# Patient Record
Sex: Female | Born: 1955 | Race: White | Hispanic: No | Marital: Married | Smoking: Never smoker
Health system: Southern US, Community
[De-identification: ages and names within clinical notes are randomized; demographics above are authoritative.]

## PROBLEM LIST (undated history)

## (undated) DIAGNOSIS — IMO0001 Reserved for inherently not codable concepts without codable children: Secondary | ICD-10-CM

## (undated) DIAGNOSIS — Z973 Presence of spectacles and contact lenses: Secondary | ICD-10-CM

## (undated) DIAGNOSIS — F32A Depression, unspecified: Secondary | ICD-10-CM

## (undated) DIAGNOSIS — Q2112 Patent foramen ovale: Secondary | ICD-10-CM

## (undated) DIAGNOSIS — W19XXXA Unspecified fall, initial encounter: Secondary | ICD-10-CM

## (undated) DIAGNOSIS — K589 Irritable bowel syndrome without diarrhea: Secondary | ICD-10-CM

## (undated) DIAGNOSIS — I10 Essential (primary) hypertension: Secondary | ICD-10-CM

## (undated) DIAGNOSIS — N200 Calculus of kidney: Secondary | ICD-10-CM

## (undated) DIAGNOSIS — M1990 Unspecified osteoarthritis, unspecified site: Secondary | ICD-10-CM

## (undated) DIAGNOSIS — G2581 Restless legs syndrome: Secondary | ICD-10-CM

## (undated) DIAGNOSIS — F4310 Post-traumatic stress disorder, unspecified: Secondary | ICD-10-CM

## (undated) DIAGNOSIS — Z9889 Other specified postprocedural states: Secondary | ICD-10-CM

## (undated) DIAGNOSIS — R112 Nausea with vomiting, unspecified: Secondary | ICD-10-CM

## (undated) DIAGNOSIS — J189 Pneumonia, unspecified organism: Secondary | ICD-10-CM

## (undated) DIAGNOSIS — F319 Bipolar disorder, unspecified: Secondary | ICD-10-CM

## (undated) DIAGNOSIS — J45909 Unspecified asthma, uncomplicated: Secondary | ICD-10-CM

## (undated) DIAGNOSIS — I471 Supraventricular tachycardia: Secondary | ICD-10-CM

## (undated) DIAGNOSIS — F329 Major depressive disorder, single episode, unspecified: Secondary | ICD-10-CM

## (undated) DIAGNOSIS — T8859XA Other complications of anesthesia, initial encounter: Secondary | ICD-10-CM

## (undated) DIAGNOSIS — E559 Vitamin D deficiency, unspecified: Secondary | ICD-10-CM

## (undated) DIAGNOSIS — M109 Gout, unspecified: Secondary | ICD-10-CM

## (undated) DIAGNOSIS — K5790 Diverticulosis of intestine, part unspecified, without perforation or abscess without bleeding: Secondary | ICD-10-CM

## (undated) DIAGNOSIS — F3181 Bipolar II disorder: Secondary | ICD-10-CM

## (undated) DIAGNOSIS — K08109 Complete loss of teeth, unspecified cause, unspecified class: Secondary | ICD-10-CM

## (undated) DIAGNOSIS — G894 Chronic pain syndrome: Secondary | ICD-10-CM

## (undated) DIAGNOSIS — I4719 Other supraventricular tachycardia: Secondary | ICD-10-CM

## (undated) DIAGNOSIS — B009 Herpesviral infection, unspecified: Secondary | ICD-10-CM

## (undated) DIAGNOSIS — M47812 Spondylosis without myelopathy or radiculopathy, cervical region: Secondary | ICD-10-CM

## (undated) DIAGNOSIS — G5600 Carpal tunnel syndrome, unspecified upper limb: Secondary | ICD-10-CM

## (undated) DIAGNOSIS — T4145XA Adverse effect of unspecified anesthetic, initial encounter: Secondary | ICD-10-CM

## (undated) DIAGNOSIS — R591 Generalized enlarged lymph nodes: Secondary | ICD-10-CM

## (undated) DIAGNOSIS — R51 Headache: Secondary | ICD-10-CM

## (undated) DIAGNOSIS — Z972 Presence of dental prosthetic device (complete) (partial): Secondary | ICD-10-CM

## (undated) DIAGNOSIS — C8206 Follicular lymphoma grade I, intrapelvic lymph nodes: Principal | ICD-10-CM

## (undated) DIAGNOSIS — M797 Fibromyalgia: Secondary | ICD-10-CM

## (undated) DIAGNOSIS — R296 Repeated falls: Secondary | ICD-10-CM

## (undated) DIAGNOSIS — F419 Anxiety disorder, unspecified: Secondary | ICD-10-CM

## (undated) DIAGNOSIS — M5414 Radiculopathy, thoracic region: Secondary | ICD-10-CM

## (undated) DIAGNOSIS — I25811 Atherosclerosis of native coronary artery of transplanted heart without angina pectoris: Secondary | ICD-10-CM

## (undated) DIAGNOSIS — M7530 Calcific tendinitis of unspecified shoulder: Secondary | ICD-10-CM

## (undated) HISTORY — DX: Depression, unspecified: F32.A

## (undated) HISTORY — DX: Unspecified asthma, uncomplicated: J45.909

## (undated) HISTORY — DX: Unspecified fall, initial encounter: W19.XXXA

## (undated) HISTORY — DX: Follicular lymphoma grade i, intrapelvic lymph nodes: C82.06

## (undated) HISTORY — DX: Atherosclerosis of native coronary artery of transplanted heart without angina pectoris: I25.811

## (undated) HISTORY — DX: Calculus of kidney: N20.0

## (undated) HISTORY — PX: COLONOSCOPY: SHX174

## (undated) HISTORY — PX: OTHER SURGICAL HISTORY: SHX169

## (undated) HISTORY — PX: CYSTOSCOPY: SUR368

## (undated) HISTORY — PX: FOOT SURGERY: SHX648

## (undated) HISTORY — DX: Essential (primary) hypertension: I10

## (undated) HISTORY — DX: Repeated falls: R29.6

## (undated) HISTORY — DX: Major depressive disorder, single episode, unspecified: F32.9

## (undated) HISTORY — DX: Fibromyalgia: M79.7

## (undated) HISTORY — DX: Reserved for inherently not codable concepts without codable children: IMO0001

## (undated) HISTORY — DX: Radiculopathy, thoracic region: M54.14

## (undated) HISTORY — DX: Gout, unspecified: M10.9

## (undated) HISTORY — DX: Calcific tendinitis of unspecified shoulder: M75.30

## (undated) HISTORY — PX: LITHOTRIPSY: SUR834

## (undated) HISTORY — DX: Anxiety disorder, unspecified: F41.9

## (undated) HISTORY — PX: ABDOMINAL ADHESION SURGERY: SHX90

## (undated) HISTORY — DX: Bipolar disorder, unspecified: F31.9

## (undated) HISTORY — DX: Other supraventricular tachycardia: I47.19

## (undated) HISTORY — DX: Headache: R51

## (undated) HISTORY — DX: Supraventricular tachycardia: I47.1

## (undated) HISTORY — DX: Spondylosis without myelopathy or radiculopathy, cervical region: M47.812

## (undated) HISTORY — DX: Carpal tunnel syndrome, unspecified upper limb: G56.00

## (undated) HISTORY — DX: Diverticulosis of intestine, part unspecified, without perforation or abscess without bleeding: K57.90

## (undated) HISTORY — DX: Patent foramen ovale: Q21.12

## (undated) HISTORY — DX: Bipolar II disorder: F31.81

## (undated) HISTORY — DX: Post-traumatic stress disorder, unspecified: F43.10

---

## 1974-10-02 HISTORY — PX: TONSILLECTOMY: SUR1361

## 1983-10-03 HISTORY — PX: CHOLECYSTECTOMY: SHX55

## 1991-10-03 HISTORY — PX: APPENDECTOMY: SHX54

## 1993-10-02 HISTORY — PX: ABDOMINAL HYSTERECTOMY: SHX81

## 1997-12-31 ENCOUNTER — Inpatient Hospital Stay (HOSPITAL_COMMUNITY): Admission: EM | Admit: 1997-12-31 | Discharge: 1998-01-06 | Payer: Self-pay | Admitting: Emergency Medicine

## 1998-10-29 ENCOUNTER — Ambulatory Visit (HOSPITAL_COMMUNITY): Admission: RE | Admit: 1998-10-29 | Discharge: 1998-10-29 | Payer: Self-pay | Admitting: Gastroenterology

## 1999-10-05 ENCOUNTER — Emergency Department (HOSPITAL_COMMUNITY): Admission: EM | Admit: 1999-10-05 | Discharge: 1999-10-06 | Payer: Self-pay | Admitting: Emergency Medicine

## 1999-10-06 ENCOUNTER — Emergency Department (HOSPITAL_COMMUNITY): Admission: EM | Admit: 1999-10-06 | Discharge: 1999-10-06 | Payer: Self-pay | Admitting: *Deleted

## 1999-10-06 ENCOUNTER — Encounter: Payer: Self-pay | Admitting: Emergency Medicine

## 1999-12-08 ENCOUNTER — Encounter: Payer: Self-pay | Admitting: Obstetrics and Gynecology

## 1999-12-08 ENCOUNTER — Encounter: Admission: RE | Admit: 1999-12-08 | Discharge: 1999-12-08 | Payer: Self-pay | Admitting: Obstetrics and Gynecology

## 2000-04-27 ENCOUNTER — Emergency Department (HOSPITAL_COMMUNITY): Admission: EM | Admit: 2000-04-27 | Discharge: 2000-04-28 | Payer: Self-pay | Admitting: Emergency Medicine

## 2000-06-05 ENCOUNTER — Ambulatory Visit (HOSPITAL_COMMUNITY): Admission: RE | Admit: 2000-06-05 | Discharge: 2000-06-05 | Payer: Self-pay | Admitting: Gastroenterology

## 2000-06-05 ENCOUNTER — Encounter: Payer: Self-pay | Admitting: Gastroenterology

## 2000-07-24 ENCOUNTER — Emergency Department (HOSPITAL_COMMUNITY): Admission: EM | Admit: 2000-07-24 | Discharge: 2000-07-25 | Payer: Self-pay

## 2000-07-30 ENCOUNTER — Emergency Department (HOSPITAL_COMMUNITY): Admission: EM | Admit: 2000-07-30 | Discharge: 2000-07-30 | Payer: Self-pay | Admitting: Emergency Medicine

## 2000-08-16 ENCOUNTER — Inpatient Hospital Stay (HOSPITAL_COMMUNITY): Admission: EM | Admit: 2000-08-16 | Discharge: 2000-08-21 | Payer: Self-pay | Admitting: Psychiatry

## 2000-08-22 ENCOUNTER — Inpatient Hospital Stay (HOSPITAL_COMMUNITY): Admission: EM | Admit: 2000-08-22 | Discharge: 2000-08-28 | Payer: Self-pay | Admitting: *Deleted

## 2000-10-02 HISTORY — PX: NECK SURGERY: SHX720

## 2001-01-03 ENCOUNTER — Encounter: Payer: Self-pay | Admitting: Obstetrics and Gynecology

## 2001-01-03 ENCOUNTER — Encounter: Admission: RE | Admit: 2001-01-03 | Discharge: 2001-01-03 | Payer: Self-pay | Admitting: Obstetrics and Gynecology

## 2001-05-30 ENCOUNTER — Encounter: Payer: Self-pay | Admitting: Neurosurgery

## 2001-06-04 ENCOUNTER — Ambulatory Visit (HOSPITAL_COMMUNITY): Admission: RE | Admit: 2001-06-04 | Discharge: 2001-06-04 | Payer: Self-pay | Admitting: Neurosurgery

## 2001-06-04 ENCOUNTER — Encounter: Payer: Self-pay | Admitting: Neurosurgery

## 2001-06-04 HISTORY — PX: CERVICAL DISC SURGERY: SHX588

## 2001-07-23 ENCOUNTER — Ambulatory Visit (HOSPITAL_COMMUNITY): Admission: RE | Admit: 2001-07-23 | Discharge: 2001-07-23 | Payer: Self-pay | Admitting: Neurosurgery

## 2001-07-23 ENCOUNTER — Encounter: Payer: Self-pay | Admitting: Neurosurgery

## 2002-01-22 ENCOUNTER — Other Ambulatory Visit: Admission: RE | Admit: 2002-01-22 | Discharge: 2002-01-22 | Payer: Self-pay | Admitting: Gynecology

## 2002-01-22 ENCOUNTER — Encounter: Admission: RE | Admit: 2002-01-22 | Discharge: 2002-01-22 | Payer: Self-pay | Admitting: Gynecology

## 2002-01-22 ENCOUNTER — Encounter: Payer: Self-pay | Admitting: Gynecology

## 2002-06-26 ENCOUNTER — Encounter: Payer: Self-pay | Admitting: Urology

## 2002-06-26 ENCOUNTER — Ambulatory Visit (HOSPITAL_COMMUNITY): Admission: RE | Admit: 2002-06-26 | Discharge: 2002-06-26 | Payer: Self-pay | Admitting: Urology

## 2002-07-01 ENCOUNTER — Ambulatory Visit (HOSPITAL_BASED_OUTPATIENT_CLINIC_OR_DEPARTMENT_OTHER): Admission: RE | Admit: 2002-07-01 | Discharge: 2002-07-01 | Payer: Self-pay | Admitting: Urology

## 2002-07-02 HISTORY — PX: OTHER SURGICAL HISTORY: SHX169

## 2002-10-13 ENCOUNTER — Encounter: Admission: RE | Admit: 2002-10-13 | Discharge: 2002-10-13 | Payer: Self-pay | Admitting: Urology

## 2002-10-13 ENCOUNTER — Encounter: Payer: Self-pay | Admitting: Urology

## 2003-01-27 ENCOUNTER — Encounter: Admission: RE | Admit: 2003-01-27 | Discharge: 2003-01-27 | Payer: Self-pay | Admitting: Gynecology

## 2003-01-27 ENCOUNTER — Encounter: Payer: Self-pay | Admitting: Gynecology

## 2003-01-27 ENCOUNTER — Other Ambulatory Visit: Admission: RE | Admit: 2003-01-27 | Discharge: 2003-01-27 | Payer: Self-pay | Admitting: Gynecology

## 2003-03-20 ENCOUNTER — Encounter
Admission: RE | Admit: 2003-03-20 | Discharge: 2003-06-18 | Payer: Self-pay | Admitting: Physical Medicine & Rehabilitation

## 2003-05-07 ENCOUNTER — Encounter
Admission: RE | Admit: 2003-05-07 | Discharge: 2003-07-22 | Payer: Self-pay | Admitting: Physical Medicine & Rehabilitation

## 2003-06-17 ENCOUNTER — Encounter
Admission: RE | Admit: 2003-06-17 | Discharge: 2003-09-15 | Payer: Self-pay | Admitting: Physical Medicine & Rehabilitation

## 2003-06-22 ENCOUNTER — Emergency Department (HOSPITAL_COMMUNITY): Admission: EM | Admit: 2003-06-22 | Discharge: 2003-06-22 | Payer: Self-pay | Admitting: Emergency Medicine

## 2003-07-09 ENCOUNTER — Emergency Department (HOSPITAL_COMMUNITY): Admission: EM | Admit: 2003-07-09 | Discharge: 2003-07-09 | Payer: Self-pay | Admitting: Emergency Medicine

## 2003-09-02 ENCOUNTER — Ambulatory Visit (HOSPITAL_COMMUNITY)
Admission: RE | Admit: 2003-09-02 | Discharge: 2003-09-02 | Payer: Self-pay | Admitting: Physical Medicine & Rehabilitation

## 2003-09-15 ENCOUNTER — Encounter
Admission: RE | Admit: 2003-09-15 | Discharge: 2003-11-25 | Payer: Self-pay | Admitting: Physical Medicine & Rehabilitation

## 2003-09-17 ENCOUNTER — Encounter
Admission: RE | Admit: 2003-09-17 | Discharge: 2003-12-16 | Payer: Self-pay | Admitting: Physical Medicine & Rehabilitation

## 2003-09-28 ENCOUNTER — Emergency Department (HOSPITAL_COMMUNITY): Admission: EM | Admit: 2003-09-28 | Discharge: 2003-09-28 | Payer: Self-pay | Admitting: Emergency Medicine

## 2003-09-30 ENCOUNTER — Ambulatory Visit (HOSPITAL_COMMUNITY): Admission: RE | Admit: 2003-09-30 | Discharge: 2003-09-30 | Payer: Self-pay | Admitting: Emergency Medicine

## 2003-11-10 ENCOUNTER — Emergency Department (HOSPITAL_COMMUNITY): Admission: EM | Admit: 2003-11-10 | Discharge: 2003-11-10 | Payer: Self-pay

## 2003-11-26 ENCOUNTER — Encounter
Admission: RE | Admit: 2003-11-26 | Discharge: 2003-12-11 | Payer: Self-pay | Admitting: Physical Medicine & Rehabilitation

## 2003-11-27 ENCOUNTER — Ambulatory Visit (HOSPITAL_COMMUNITY)
Admission: RE | Admit: 2003-11-27 | Discharge: 2003-11-27 | Payer: Self-pay | Admitting: Physical Medicine & Rehabilitation

## 2003-12-09 ENCOUNTER — Emergency Department (HOSPITAL_COMMUNITY): Admission: EM | Admit: 2003-12-09 | Discharge: 2003-12-09 | Payer: Self-pay

## 2003-12-10 ENCOUNTER — Emergency Department (HOSPITAL_COMMUNITY): Admission: EM | Admit: 2003-12-10 | Discharge: 2003-12-10 | Payer: Self-pay | Admitting: Emergency Medicine

## 2003-12-14 ENCOUNTER — Emergency Department (HOSPITAL_COMMUNITY): Admission: EM | Admit: 2003-12-14 | Discharge: 2003-12-15 | Payer: Self-pay | Admitting: *Deleted

## 2004-01-05 ENCOUNTER — Ambulatory Visit (HOSPITAL_COMMUNITY): Admission: RE | Admit: 2004-01-05 | Discharge: 2004-01-05 | Payer: Self-pay | Admitting: Neurology

## 2004-01-18 ENCOUNTER — Encounter
Admission: RE | Admit: 2004-01-18 | Discharge: 2004-04-17 | Payer: Self-pay | Admitting: Physical Medicine & Rehabilitation

## 2004-03-29 ENCOUNTER — Ambulatory Visit (HOSPITAL_COMMUNITY): Admission: RE | Admit: 2004-03-29 | Discharge: 2004-03-29 | Payer: Self-pay | Admitting: Endocrinology

## 2004-04-12 ENCOUNTER — Ambulatory Visit (HOSPITAL_COMMUNITY): Admission: RE | Admit: 2004-04-12 | Discharge: 2004-04-12 | Payer: Self-pay | Admitting: Endocrinology

## 2004-04-16 ENCOUNTER — Emergency Department (HOSPITAL_COMMUNITY): Admission: EM | Admit: 2004-04-16 | Discharge: 2004-04-16 | Payer: Self-pay | Admitting: Emergency Medicine

## 2004-04-20 ENCOUNTER — Encounter
Admission: RE | Admit: 2004-04-20 | Discharge: 2004-06-15 | Payer: Self-pay | Admitting: Physical Medicine & Rehabilitation

## 2004-04-21 ENCOUNTER — Emergency Department (HOSPITAL_COMMUNITY): Admission: EM | Admit: 2004-04-21 | Discharge: 2004-04-21 | Payer: Self-pay | Admitting: Emergency Medicine

## 2004-04-27 ENCOUNTER — Ambulatory Visit (HOSPITAL_COMMUNITY): Admission: RE | Admit: 2004-04-27 | Discharge: 2004-04-27 | Payer: Self-pay | Admitting: Emergency Medicine

## 2004-05-16 ENCOUNTER — Other Ambulatory Visit: Admission: RE | Admit: 2004-05-16 | Discharge: 2004-05-16 | Payer: Self-pay | Admitting: Gynecology

## 2004-06-15 ENCOUNTER — Encounter
Admission: RE | Admit: 2004-06-15 | Discharge: 2004-09-13 | Payer: Self-pay | Admitting: Physical Medicine & Rehabilitation

## 2004-06-15 ENCOUNTER — Ambulatory Visit: Payer: Self-pay | Admitting: Physical Medicine & Rehabilitation

## 2004-06-22 ENCOUNTER — Emergency Department (HOSPITAL_COMMUNITY): Admission: EM | Admit: 2004-06-22 | Discharge: 2004-06-22 | Payer: Self-pay | Admitting: Emergency Medicine

## 2004-06-27 ENCOUNTER — Ambulatory Visit (HOSPITAL_COMMUNITY): Admission: RE | Admit: 2004-06-27 | Discharge: 2004-06-27 | Payer: Self-pay | Admitting: Neurology

## 2004-06-30 ENCOUNTER — Ambulatory Visit (HOSPITAL_COMMUNITY)
Admission: RE | Admit: 2004-06-30 | Discharge: 2004-06-30 | Payer: Self-pay | Admitting: Physical Medicine & Rehabilitation

## 2004-07-11 ENCOUNTER — Emergency Department (HOSPITAL_COMMUNITY): Admission: EM | Admit: 2004-07-11 | Discharge: 2004-07-11 | Payer: Self-pay | Admitting: *Deleted

## 2004-08-23 ENCOUNTER — Encounter
Admission: RE | Admit: 2004-08-23 | Discharge: 2004-11-21 | Payer: Self-pay | Admitting: Physical Medicine & Rehabilitation

## 2004-08-29 ENCOUNTER — Ambulatory Visit: Payer: Self-pay | Admitting: Physical Medicine & Rehabilitation

## 2004-10-28 ENCOUNTER — Ambulatory Visit: Payer: Self-pay | Admitting: Physical Medicine & Rehabilitation

## 2004-12-08 ENCOUNTER — Emergency Department (HOSPITAL_COMMUNITY): Admission: EM | Admit: 2004-12-08 | Discharge: 2004-12-08 | Payer: Self-pay | Admitting: Emergency Medicine

## 2004-12-19 ENCOUNTER — Encounter
Admission: RE | Admit: 2004-12-19 | Discharge: 2005-03-19 | Payer: Self-pay | Admitting: Physical Medicine & Rehabilitation

## 2004-12-21 ENCOUNTER — Ambulatory Visit: Payer: Self-pay | Admitting: Physical Medicine & Rehabilitation

## 2005-01-12 ENCOUNTER — Ambulatory Visit (HOSPITAL_COMMUNITY): Admission: RE | Admit: 2005-01-12 | Discharge: 2005-01-12 | Payer: Self-pay | Admitting: Neurology

## 2005-02-20 ENCOUNTER — Ambulatory Visit: Payer: Self-pay | Admitting: Physical Medicine & Rehabilitation

## 2005-04-06 ENCOUNTER — Encounter
Admission: RE | Admit: 2005-04-06 | Discharge: 2005-07-05 | Payer: Self-pay | Admitting: Physical Medicine & Rehabilitation

## 2005-04-06 ENCOUNTER — Ambulatory Visit: Payer: Self-pay | Admitting: Physical Medicine & Rehabilitation

## 2005-05-30 ENCOUNTER — Ambulatory Visit: Payer: Self-pay | Admitting: Physical Medicine & Rehabilitation

## 2005-06-09 ENCOUNTER — Emergency Department (HOSPITAL_COMMUNITY): Admission: EM | Admit: 2005-06-09 | Discharge: 2005-06-09 | Payer: Self-pay | Admitting: Emergency Medicine

## 2005-08-01 ENCOUNTER — Ambulatory Visit: Payer: Self-pay | Admitting: Physical Medicine & Rehabilitation

## 2005-08-01 ENCOUNTER — Encounter
Admission: RE | Admit: 2005-08-01 | Discharge: 2005-10-30 | Payer: Self-pay | Admitting: Physical Medicine & Rehabilitation

## 2005-10-05 ENCOUNTER — Other Ambulatory Visit: Admission: RE | Admit: 2005-10-05 | Discharge: 2005-10-05 | Payer: Self-pay | Admitting: Gynecology

## 2005-10-05 ENCOUNTER — Ambulatory Visit: Payer: Self-pay | Admitting: Physical Medicine & Rehabilitation

## 2005-10-16 ENCOUNTER — Ambulatory Visit: Payer: Self-pay | Admitting: Physical Medicine & Rehabilitation

## 2005-11-29 ENCOUNTER — Encounter
Admission: RE | Admit: 2005-11-29 | Discharge: 2006-02-27 | Payer: Self-pay | Admitting: Physical Medicine & Rehabilitation

## 2005-12-01 ENCOUNTER — Ambulatory Visit: Payer: Self-pay | Admitting: Physical Medicine & Rehabilitation

## 2006-01-05 ENCOUNTER — Emergency Department (HOSPITAL_COMMUNITY): Admission: EM | Admit: 2006-01-05 | Discharge: 2006-01-05 | Payer: Self-pay | Admitting: Emergency Medicine

## 2006-01-18 ENCOUNTER — Ambulatory Visit: Payer: Self-pay | Admitting: Critical Care Medicine

## 2006-01-18 ENCOUNTER — Inpatient Hospital Stay (HOSPITAL_COMMUNITY): Admission: EM | Admit: 2006-01-18 | Discharge: 2006-01-22 | Payer: Self-pay | Admitting: Emergency Medicine

## 2006-01-22 ENCOUNTER — Inpatient Hospital Stay (HOSPITAL_COMMUNITY): Admission: RE | Admit: 2006-01-22 | Discharge: 2006-01-29 | Payer: Self-pay | Admitting: Psychiatry

## 2006-01-23 ENCOUNTER — Ambulatory Visit: Payer: Self-pay | Admitting: Psychiatry

## 2006-02-07 ENCOUNTER — Ambulatory Visit: Payer: Self-pay | Admitting: Physical Medicine & Rehabilitation

## 2006-03-08 ENCOUNTER — Encounter
Admission: RE | Admit: 2006-03-08 | Discharge: 2006-06-06 | Payer: Self-pay | Admitting: Physical Medicine & Rehabilitation

## 2006-04-02 ENCOUNTER — Ambulatory Visit: Payer: Self-pay | Admitting: Physical Medicine & Rehabilitation

## 2006-05-02 ENCOUNTER — Ambulatory Visit: Payer: Self-pay | Admitting: Physical Medicine & Rehabilitation

## 2006-05-29 ENCOUNTER — Ambulatory Visit: Payer: Self-pay | Admitting: Physical Medicine & Rehabilitation

## 2006-06-25 ENCOUNTER — Encounter
Admission: RE | Admit: 2006-06-25 | Discharge: 2006-09-23 | Payer: Self-pay | Admitting: Physical Medicine & Rehabilitation

## 2006-06-26 ENCOUNTER — Ambulatory Visit (HOSPITAL_COMMUNITY)
Admission: RE | Admit: 2006-06-26 | Discharge: 2006-06-26 | Payer: Self-pay | Admitting: Physical Medicine & Rehabilitation

## 2006-07-25 ENCOUNTER — Ambulatory Visit: Payer: Self-pay | Admitting: Physical Medicine & Rehabilitation

## 2006-08-20 ENCOUNTER — Ambulatory Visit: Payer: Self-pay | Admitting: Physical Medicine & Rehabilitation

## 2006-09-14 ENCOUNTER — Ambulatory Visit: Payer: Self-pay | Admitting: Physical Medicine & Rehabilitation

## 2006-09-17 ENCOUNTER — Emergency Department (HOSPITAL_COMMUNITY): Admission: EM | Admit: 2006-09-17 | Discharge: 2006-09-17 | Payer: Self-pay | Admitting: Emergency Medicine

## 2006-10-11 ENCOUNTER — Encounter
Admission: RE | Admit: 2006-10-11 | Discharge: 2007-01-09 | Payer: Self-pay | Admitting: Physical Medicine & Rehabilitation

## 2006-10-12 ENCOUNTER — Other Ambulatory Visit: Admission: RE | Admit: 2006-10-12 | Discharge: 2006-10-12 | Payer: Self-pay | Admitting: Family Medicine

## 2006-10-29 ENCOUNTER — Emergency Department (HOSPITAL_COMMUNITY): Admission: EM | Admit: 2006-10-29 | Discharge: 2006-10-29 | Payer: Self-pay | Admitting: Emergency Medicine

## 2006-11-12 ENCOUNTER — Ambulatory Visit: Payer: Self-pay | Admitting: Physical Medicine & Rehabilitation

## 2007-01-01 ENCOUNTER — Observation Stay (HOSPITAL_COMMUNITY): Admission: EM | Admit: 2007-01-01 | Discharge: 2007-01-02 | Payer: Self-pay | Admitting: Emergency Medicine

## 2007-01-03 ENCOUNTER — Encounter
Admission: RE | Admit: 2007-01-03 | Discharge: 2007-04-03 | Payer: Self-pay | Admitting: Physical Medicine & Rehabilitation

## 2007-01-03 ENCOUNTER — Ambulatory Visit: Payer: Self-pay | Admitting: Physical Medicine & Rehabilitation

## 2007-02-06 ENCOUNTER — Observation Stay (HOSPITAL_COMMUNITY): Admission: EM | Admit: 2007-02-06 | Discharge: 2007-02-07 | Payer: Self-pay | Admitting: Emergency Medicine

## 2007-02-07 ENCOUNTER — Encounter (INDEPENDENT_AMBULATORY_CARE_PROVIDER_SITE_OTHER): Payer: Self-pay | Admitting: Cardiology

## 2007-03-06 ENCOUNTER — Ambulatory Visit: Payer: Self-pay | Admitting: Physical Medicine & Rehabilitation

## 2007-03-28 ENCOUNTER — Encounter
Admission: RE | Admit: 2007-03-28 | Discharge: 2007-06-26 | Payer: Self-pay | Admitting: Physical Medicine & Rehabilitation

## 2007-04-26 ENCOUNTER — Ambulatory Visit: Payer: Self-pay | Admitting: Physical Medicine & Rehabilitation

## 2007-05-21 ENCOUNTER — Encounter: Admission: RE | Admit: 2007-05-21 | Discharge: 2007-05-21 | Payer: Self-pay | Admitting: Family Medicine

## 2007-07-03 ENCOUNTER — Ambulatory Visit: Payer: Self-pay | Admitting: Physical Medicine & Rehabilitation

## 2007-07-08 ENCOUNTER — Encounter
Admission: RE | Admit: 2007-07-08 | Discharge: 2007-10-06 | Payer: Self-pay | Admitting: Physical Medicine & Rehabilitation

## 2007-08-22 ENCOUNTER — Ambulatory Visit: Payer: Self-pay | Admitting: Physical Medicine & Rehabilitation

## 2007-10-16 ENCOUNTER — Other Ambulatory Visit: Admission: RE | Admit: 2007-10-16 | Discharge: 2007-10-16 | Payer: Self-pay | Admitting: Family Medicine

## 2007-11-06 ENCOUNTER — Encounter
Admission: RE | Admit: 2007-11-06 | Discharge: 2008-02-04 | Payer: Self-pay | Admitting: Physical Medicine & Rehabilitation

## 2007-11-19 ENCOUNTER — Ambulatory Visit: Payer: Self-pay | Admitting: Physical Medicine & Rehabilitation

## 2008-02-05 ENCOUNTER — Encounter
Admission: RE | Admit: 2008-02-05 | Discharge: 2008-05-05 | Payer: Self-pay | Admitting: Physical Medicine & Rehabilitation

## 2008-02-12 ENCOUNTER — Ambulatory Visit: Payer: Self-pay | Admitting: Physical Medicine & Rehabilitation

## 2008-03-12 ENCOUNTER — Ambulatory Visit: Payer: Self-pay | Admitting: Physical Medicine & Rehabilitation

## 2008-03-23 ENCOUNTER — Encounter
Admission: RE | Admit: 2008-03-23 | Discharge: 2008-03-23 | Payer: Self-pay | Admitting: Physical Medicine & Rehabilitation

## 2008-03-23 ENCOUNTER — Ambulatory Visit: Payer: Self-pay | Admitting: Physical Medicine & Rehabilitation

## 2008-03-25 ENCOUNTER — Ambulatory Visit: Payer: Self-pay | Admitting: Physical Medicine & Rehabilitation

## 2008-04-07 ENCOUNTER — Ambulatory Visit: Payer: Self-pay | Admitting: Physical Medicine & Rehabilitation

## 2008-05-06 ENCOUNTER — Encounter
Admission: RE | Admit: 2008-05-06 | Discharge: 2008-08-04 | Payer: Self-pay | Admitting: Physical Medicine & Rehabilitation

## 2008-05-19 ENCOUNTER — Ambulatory Visit: Payer: Self-pay | Admitting: Physical Medicine & Rehabilitation

## 2008-06-16 ENCOUNTER — Ambulatory Visit: Payer: Self-pay | Admitting: Physical Medicine & Rehabilitation

## 2008-07-08 ENCOUNTER — Ambulatory Visit: Payer: Self-pay | Admitting: Physical Medicine & Rehabilitation

## 2008-07-15 ENCOUNTER — Ambulatory Visit: Payer: Self-pay | Admitting: Physical Medicine & Rehabilitation

## 2008-09-07 ENCOUNTER — Encounter
Admission: RE | Admit: 2008-09-07 | Discharge: 2008-09-09 | Payer: Self-pay | Admitting: Physical Medicine & Rehabilitation

## 2008-09-09 ENCOUNTER — Ambulatory Visit: Payer: Self-pay | Admitting: Physical Medicine & Rehabilitation

## 2008-11-10 ENCOUNTER — Ambulatory Visit: Payer: Self-pay | Admitting: Physical Medicine & Rehabilitation

## 2008-11-10 ENCOUNTER — Encounter
Admission: RE | Admit: 2008-11-10 | Discharge: 2009-02-08 | Payer: Self-pay | Admitting: Physical Medicine & Rehabilitation

## 2008-12-09 ENCOUNTER — Ambulatory Visit: Payer: Self-pay | Admitting: Physical Medicine & Rehabilitation

## 2009-01-08 ENCOUNTER — Ambulatory Visit: Payer: Self-pay | Admitting: Physical Medicine & Rehabilitation

## 2009-02-03 ENCOUNTER — Encounter
Admission: RE | Admit: 2009-02-03 | Discharge: 2009-05-04 | Payer: Self-pay | Admitting: Physical Medicine & Rehabilitation

## 2009-02-05 ENCOUNTER — Ambulatory Visit: Payer: Self-pay | Admitting: Physical Medicine & Rehabilitation

## 2009-03-11 ENCOUNTER — Ambulatory Visit: Payer: Self-pay | Admitting: Physical Medicine & Rehabilitation

## 2009-04-09 ENCOUNTER — Encounter: Admission: RE | Admit: 2009-04-09 | Discharge: 2009-04-09 | Payer: Self-pay | Admitting: Family Medicine

## 2009-04-09 ENCOUNTER — Ambulatory Visit: Payer: Self-pay | Admitting: Physical Medicine & Rehabilitation

## 2009-04-30 ENCOUNTER — Ambulatory Visit: Payer: Self-pay | Admitting: Physical Medicine & Rehabilitation

## 2009-05-25 ENCOUNTER — Encounter
Admission: RE | Admit: 2009-05-25 | Discharge: 2009-08-19 | Payer: Self-pay | Admitting: Physical Medicine & Rehabilitation

## 2009-06-02 ENCOUNTER — Ambulatory Visit: Payer: Self-pay | Admitting: Physical Medicine & Rehabilitation

## 2009-07-02 ENCOUNTER — Ambulatory Visit: Payer: Self-pay | Admitting: Physical Medicine & Rehabilitation

## 2009-07-30 ENCOUNTER — Ambulatory Visit: Payer: Self-pay | Admitting: Physical Medicine & Rehabilitation

## 2009-08-19 ENCOUNTER — Encounter
Admission: RE | Admit: 2009-08-19 | Discharge: 2009-09-29 | Payer: Self-pay | Admitting: Physical Medicine & Rehabilitation

## 2009-08-31 ENCOUNTER — Ambulatory Visit: Payer: Self-pay | Admitting: Physical Medicine & Rehabilitation

## 2009-09-29 ENCOUNTER — Ambulatory Visit: Payer: Self-pay | Admitting: Physical Medicine & Rehabilitation

## 2009-10-26 ENCOUNTER — Encounter
Admission: RE | Admit: 2009-10-26 | Discharge: 2010-01-20 | Payer: Self-pay | Admitting: Physical Medicine & Rehabilitation

## 2009-10-27 ENCOUNTER — Ambulatory Visit: Payer: Self-pay | Admitting: Physical Medicine & Rehabilitation

## 2009-11-04 ENCOUNTER — Encounter
Admission: RE | Admit: 2009-11-04 | Discharge: 2009-11-11 | Payer: Self-pay | Admitting: Physical Medicine & Rehabilitation

## 2009-11-11 ENCOUNTER — Ambulatory Visit: Payer: Self-pay | Admitting: Physical Medicine & Rehabilitation

## 2009-12-02 ENCOUNTER — Ambulatory Visit: Payer: Self-pay | Admitting: Physical Medicine & Rehabilitation

## 2009-12-03 ENCOUNTER — Ambulatory Visit: Payer: Self-pay | Admitting: Physical Medicine & Rehabilitation

## 2009-12-07 ENCOUNTER — Ambulatory Visit (HOSPITAL_COMMUNITY): Admission: RE | Admit: 2009-12-07 | Discharge: 2009-12-07 | Payer: Self-pay | Admitting: Family Medicine

## 2009-12-29 ENCOUNTER — Ambulatory Visit: Payer: Self-pay | Admitting: Physical Medicine & Rehabilitation

## 2010-01-20 ENCOUNTER — Encounter
Admission: RE | Admit: 2010-01-20 | Discharge: 2010-04-15 | Payer: Self-pay | Admitting: Physical Medicine & Rehabilitation

## 2010-01-27 ENCOUNTER — Ambulatory Visit: Payer: Self-pay | Admitting: Physical Medicine & Rehabilitation

## 2010-02-25 ENCOUNTER — Ambulatory Visit: Payer: Self-pay | Admitting: Physical Medicine & Rehabilitation

## 2010-03-24 ENCOUNTER — Ambulatory Visit: Payer: Self-pay | Admitting: Physical Medicine & Rehabilitation

## 2010-04-15 ENCOUNTER — Encounter
Admission: RE | Admit: 2010-04-15 | Discharge: 2010-07-14 | Payer: Self-pay | Admitting: Physical Medicine & Rehabilitation

## 2010-04-25 ENCOUNTER — Ambulatory Visit: Payer: Self-pay | Admitting: Physical Medicine & Rehabilitation

## 2010-05-25 ENCOUNTER — Ambulatory Visit: Payer: Self-pay | Admitting: Physical Medicine & Rehabilitation

## 2010-06-23 ENCOUNTER — Ambulatory Visit: Payer: Self-pay | Admitting: Physical Medicine & Rehabilitation

## 2010-07-14 ENCOUNTER — Encounter
Admission: RE | Admit: 2010-07-14 | Discharge: 2010-09-19 | Payer: Self-pay | Source: Home / Self Care | Attending: Physical Medicine & Rehabilitation | Admitting: Physical Medicine & Rehabilitation

## 2010-08-19 ENCOUNTER — Ambulatory Visit: Payer: Self-pay | Admitting: Physical Medicine & Rehabilitation

## 2010-09-15 ENCOUNTER — Ambulatory Visit: Payer: Self-pay | Admitting: Physical Medicine & Rehabilitation

## 2010-09-19 ENCOUNTER — Ambulatory Visit: Payer: Self-pay | Admitting: Physical Medicine & Rehabilitation

## 2010-10-17 ENCOUNTER — Ambulatory Visit (HOSPITAL_COMMUNITY)
Admission: RE | Admit: 2010-10-17 | Discharge: 2010-10-17 | Payer: Self-pay | Source: Home / Self Care | Attending: Gastroenterology | Admitting: Gastroenterology

## 2010-10-19 ENCOUNTER — Encounter
Admission: RE | Admit: 2010-10-19 | Discharge: 2010-10-24 | Payer: Self-pay | Source: Home / Self Care | Attending: Physical Medicine & Rehabilitation | Admitting: Physical Medicine & Rehabilitation

## 2010-10-20 ENCOUNTER — Ambulatory Visit (HOSPITAL_COMMUNITY)
Admission: RE | Admit: 2010-10-20 | Discharge: 2010-10-20 | Payer: Self-pay | Source: Home / Self Care | Attending: Gastroenterology | Admitting: Gastroenterology

## 2010-10-22 ENCOUNTER — Encounter: Payer: Self-pay | Admitting: Gynecology

## 2010-10-22 ENCOUNTER — Encounter: Payer: Self-pay | Admitting: Emergency Medicine

## 2010-10-22 ENCOUNTER — Encounter: Payer: Self-pay | Admitting: Family Medicine

## 2010-10-23 ENCOUNTER — Encounter: Payer: Self-pay | Admitting: Neurology

## 2010-10-23 ENCOUNTER — Encounter: Payer: Self-pay | Admitting: Family Medicine

## 2010-10-23 ENCOUNTER — Encounter: Payer: Self-pay | Admitting: Gynecology

## 2010-10-24 ENCOUNTER — Ambulatory Visit
Admission: RE | Admit: 2010-10-24 | Discharge: 2010-10-24 | Payer: Self-pay | Source: Home / Self Care | Attending: Physical Medicine & Rehabilitation | Admitting: Physical Medicine & Rehabilitation

## 2010-10-25 NOTE — Assessment & Plan Note (Addendum)
HISTORY:  Patty Salas is back regarding her multiple pain issues.  She is going through usual psychosocial stressors and having them deal with her husband, father, etc.  She seems to develop a bit of fortitude and resilience as result of some of her problems.  Her right hand continues to bother with more tingling and pain in the palm and fingers.  We talked about carpal tunnel injection at last visit.  She also has usual trigger points in shoulders, neck, and back.  She is on her fentanyl patch as well as her hydrocodone for baseline pain control.  Her pain on average is 7-8/10, described as sharp, tingling, stabbing, constant. Sleep is fair.  Pain interferes with general activity, relations with others, enjoyment of life on a moderate-to-severe level.  REVIEW OF SYSTEMS:  Notable for bladder and bowel control issues, spasms, weight loss.  She is seeing Dr. Kinnie Scales, regarding hemorrhoids and is having treatment for these.  She does have some intermittent diarrhea and constipation, later irritable bowel.  SOCIAL HISTORY:  Notable for the above.  She is living with her husband, who has known medical issues currently.  PHYSICAL EXAMINATION:  VITAL SIGNS:  Blood pressure 136/77, pulse 98, respiratory rate 18, and she is satting 93% on room air. GENERAL:  The patient is pleasant, alert, and oriented x3.  She seems much more calm and resolute today. HEART:  Regular. CHEST:  Clear. ABDOMEN:  Soft and nontender.  She had pain at the right hand and positive Tinel sign today.  Shoulder was minimally tender to palpation and range today.  Strength generally 5/5 except for some pain inhibition in the right upper extremity.  Sensory exam was unremarkable.  She had trigger points in the upper trap as well as the right thoracic paraspinals and left quadratus lumborum.  ASSESSMENT: 1. Fibromyalgia. 2. Migraine headaches. 3. Right carpal tunnel syndrome. 4. History of lumbar disk disease with  radiculopathy. 5. Myofascial pain. 6. Left biceps tendonitis.  PLAN: 1. After informed consent we injected the right wrist at level of the     carpal bones with 40 mg Kenalog and 2 mL of 1% lidocaine.  The     patient tolerated it well. 2. I injected the left quadratus lumborum region as well as the right     thoracic paraspinals at T7 each with 2 mL of 1% lidocaine.  The     patient tolerated it well. 3. I encouraged the patient to work on home exercise program.  We can     look at a formal physical therapy program when she is ready with     her other medical issues. 4. She will follow up with Dr. Kinnie Scales and her family doctor regarding     her elevated sed rate seen on a recent lab. 5. I will see her back in about 3 months with 76-month followup.  I     will refill her fentanyl patch today.  We recently refilled her     hydrocodone already.     Ranelle Oyster, M.D. Electronically Signed    ZTS/MedQ D:  10/24/2010 13:04:37  T:  10/24/2010 23:29:13  Job #:  [id number]  cc:   Genene Churn. Love, M.D. Fax: [phone]  Holley Bouche, M.D. Fax: [phone]

## 2010-11-04 ENCOUNTER — Emergency Department (HOSPITAL_COMMUNITY)
Admission: EM | Admit: 2010-11-04 | Discharge: 2010-11-04 | Disposition: A | Discharge status: Home / Self Care | Payer: 59 | Attending: Emergency Medicine | Admitting: Emergency Medicine

## 2010-11-04 DIAGNOSIS — R0602 Shortness of breath: Secondary | ICD-10-CM | POA: Insufficient documentation

## 2010-11-04 DIAGNOSIS — R1013 Epigastric pain: Secondary | ICD-10-CM | POA: Insufficient documentation

## 2010-11-04 DIAGNOSIS — R071 Chest pain on breathing: Secondary | ICD-10-CM | POA: Insufficient documentation

## 2010-11-04 DIAGNOSIS — K2990 Gastroduodenitis, unspecified, without bleeding: Secondary | ICD-10-CM | POA: Insufficient documentation

## 2010-11-04 DIAGNOSIS — R197 Diarrhea, unspecified: Secondary | ICD-10-CM | POA: Insufficient documentation

## 2010-11-04 DIAGNOSIS — G7241 Inclusion body myositis [IBM]: Secondary | ICD-10-CM | POA: Insufficient documentation

## 2010-11-04 DIAGNOSIS — K589 Irritable bowel syndrome without diarrhea: Secondary | ICD-10-CM | POA: Insufficient documentation

## 2010-11-04 DIAGNOSIS — K219 Gastro-esophageal reflux disease without esophagitis: Secondary | ICD-10-CM | POA: Insufficient documentation

## 2010-11-04 DIAGNOSIS — K2970 Gastritis, unspecified, without bleeding: Secondary | ICD-10-CM | POA: Insufficient documentation

## 2010-11-04 DIAGNOSIS — Z79899 Other long term (current) drug therapy: Secondary | ICD-10-CM | POA: Insufficient documentation

## 2010-11-04 LAB — CBC
MCHC: 33.5 g/dL (ref 30.0–36.0)
MCV: 92 fL (ref 78.0–100.0)
Platelets: 326 10*3/uL (ref 150–400)
RDW: 13.1 % (ref 11.5–15.5)
WBC: 8 10*3/uL (ref 4.0–10.5)

## 2010-11-04 LAB — COMPREHENSIVE METABOLIC PANEL
Albumin: 4.5 g/dL (ref 3.5–5.2)
Alkaline Phosphatase: 90 U/L (ref 39–117)
BUN: 6 mg/dL (ref 6–23)
Calcium: 9.6 mg/dL (ref 8.4–10.5)
Creatinine, Ser: 0.58 mg/dL (ref 0.4–1.2)
Glucose, Bld: 103 mg/dL — ABNORMAL HIGH (ref 70–99)
Potassium: 3.3 mEq/L — ABNORMAL LOW (ref 3.5–5.1)
Total Protein: 8.7 g/dL — ABNORMAL HIGH (ref 6.0–8.3)

## 2010-11-04 LAB — URINALYSIS, ROUTINE W REFLEX MICROSCOPIC
Leukocytes, UA: NEGATIVE
Nitrite: NEGATIVE
Specific Gravity, Urine: 1.015 (ref 1.005–1.030)
Urine Glucose, Fasting: NEGATIVE mg/dL
pH: 5.5 (ref 5.0–8.0)

## 2010-11-04 LAB — LIPASE, BLOOD: Lipase: 25 U/L (ref 11–59)

## 2010-11-04 LAB — DIFFERENTIAL
Basophils Absolute: 0.1 10*3/uL (ref 0.0–0.1)
Eosinophils Absolute: 0.2 10*3/uL (ref 0.0–0.7)
Eosinophils Relative: 2 % (ref 0–5)
Lymphs Abs: 1.2 10*3/uL (ref 0.7–4.0)
Monocytes Absolute: 0.7 10*3/uL (ref 0.1–1.0)

## 2010-11-04 LAB — URINE MICROSCOPIC-ADD ON

## 2010-11-24 ENCOUNTER — Ambulatory Visit: Payer: 59

## 2010-11-24 ENCOUNTER — Encounter: Payer: 59 | Attending: Physical Medicine & Rehabilitation

## 2010-11-24 DIAGNOSIS — IMO0001 Reserved for inherently not codable concepts without codable children: Secondary | ICD-10-CM

## 2010-11-24 DIAGNOSIS — F341 Dysthymic disorder: Secondary | ICD-10-CM

## 2010-11-24 DIAGNOSIS — M5137 Other intervertebral disc degeneration, lumbosacral region: Secondary | ICD-10-CM | POA: Insufficient documentation

## 2010-11-24 DIAGNOSIS — M67919 Unspecified disorder of synovium and tendon, unspecified shoulder: Secondary | ICD-10-CM | POA: Insufficient documentation

## 2010-11-24 DIAGNOSIS — G5600 Carpal tunnel syndrome, unspecified upper limb: Secondary | ICD-10-CM | POA: Insufficient documentation

## 2010-11-24 DIAGNOSIS — M7530 Calcific tendinitis of unspecified shoulder: Secondary | ICD-10-CM

## 2010-11-24 DIAGNOSIS — M51379 Other intervertebral disc degeneration, lumbosacral region without mention of lumbar back pain or lower extremity pain: Secondary | ICD-10-CM | POA: Insufficient documentation

## 2010-11-24 DIAGNOSIS — M531 Cervicobrachial syndrome: Secondary | ICD-10-CM

## 2010-11-24 DIAGNOSIS — M719 Bursopathy, unspecified: Secondary | ICD-10-CM | POA: Insufficient documentation

## 2010-12-01 ENCOUNTER — Ambulatory Visit: Payer: 59

## 2010-12-29 ENCOUNTER — Ambulatory Visit: Payer: 59

## 2011-01-25 ENCOUNTER — Encounter: Payer: 59 | Attending: Physical Medicine & Rehabilitation | Admitting: Physical Medicine & Rehabilitation

## 2011-01-25 DIAGNOSIS — G5600 Carpal tunnel syndrome, unspecified upper limb: Secondary | ICD-10-CM | POA: Insufficient documentation

## 2011-01-25 DIAGNOSIS — M519 Unspecified thoracic, thoracolumbar and lumbosacral intervertebral disc disorder: Secondary | ICD-10-CM | POA: Insufficient documentation

## 2011-01-25 DIAGNOSIS — IMO0001 Reserved for inherently not codable concepts without codable children: Secondary | ICD-10-CM

## 2011-01-25 DIAGNOSIS — G43909 Migraine, unspecified, not intractable, without status migrainosus: Secondary | ICD-10-CM | POA: Insufficient documentation

## 2011-01-26 NOTE — Assessment & Plan Note (Signed)
Margeret is back regarding her pain issues.  Things have been settling down a bit at home.  She is having some pain once again in some of her familiar trigger point areas and requests some more injections today. The right carpal tunnel injection seemed to provide a lot of relief for her right hand.  She rates her pain 8/10 today, described as sharp, burning, and stabbing.  REVIEW OF SYSTEMS:  Notable for spasms, tremor, bowel and bladder issues, weight loss which she attributes to her IBS symptoms and her headaches.  Full 12-review is in the written health and history section of the chart.  SOCIAL HISTORY:  The patient is married, living with husband who is now retired.  PHYSICAL EXAMINATION:  VITAL SIGNS:  Blood pressure 102/65, pulse 105, respiratory rate 18, and she is saturating 95% on room air. GENERAL:  The patient is pleasant.  She does seem quite calm today and more organized.  Weight is down. HEART:  Regular. CHEST:  Clear. ABDOMEN:  Soft and nontender. MUSCULOSKELETAL:  She uses a cane for balance.  The patient has trigger points particularly on the left side on the left splenius capitis as well as left trap and midbody.  Right trap as well is also tight.  She has trigger points in the bilateral lumbar paraspinals as well.  ASSESSMENT: 1. Fibromyalgia syndrome with notable myofascial pain. 2. Migraine headaches. 3. Right carpal tunnel syndrome. 4. Lumbar disk disease with radiculopathy.  PLAN: 1. After informed consent, we injected multiple trigger point     injections today including bilateral trapezius, left splenius     capitis, and bilateral lumbar paraspinals using a total of 2 mL of     1% lidocaine at each location.  The patient tolerated well with     minimal bleeding. 2. I refilled her fentanyl patch today as well as Lidoderm patches     which she finds helpful. 3. Discussed appropriate exercise and conservative measures for pain     relief and aerobic  activity.  She is showing some gradual     improvement, I believe especially emotionally.  She needs to     continue to take time for herself every day which I think she is     trying to do. 4. I will see her back in about a month's time.  She will call me with     any problems or questions.  Dr. Kinnie Scales will follow up regarding any     of her recent GI symptoms.  I did recommend a probiotic over-the-     counter for IBS.     Ranelle Oyster, M.D. Electronically Signed   ZTS/MedQ D:  01/25/2011 14:41:45  T:  01/26/2011 01:11:26  Job #:  [id number]  cc:   Holley Bouche, M.D. Fax: [phone]  Avie Echevaria, MD

## 2011-02-14 NOTE — Assessment & Plan Note (Signed)
HISTORY:  [name] is back regarding her multiple pain complaints.  She saw Dr. Sandria Manly just over a week ago.  She had a spell where she passed  out and had some nausea and vomiting apparently in this office.  She was  found to be anemia.  Dr. Sandria Manly placed her on some Neurontin, I suppose  for sleep and potentially restless leg symptoms at night.  She is on 700  mg currently of Neurontin and she is having some problems with falls,  now with her balance.  She rates her pain 9/10, described as sharp,  stabbing, and constant.  Pain interferes with general activity,  relations with others, enjoyment of life on a severe level.  Sleep is  fair.   REVIEW OF SYSTEMS:  Notable for the above.  Full 14-point review is in  the written health and history section of the chart.  The patient still  having some pain and feet from her gout.   SOCIAL HISTORY:  The patient is married and apparently she is doing a  bit better with her husband now.   PHYSICAL EXAMINATION:  VITAL SIGNS:  Blood pressure 140/36, pulse is 95,  respiratory rate 18, and she is sating 99% on room air.  GENERAL:  The patient is pleasant, alert, and oriented x3.  She is using  a walker today for balance.  She walked without it today and actually  did fairly well.  She has some trigger points at familial areas along  the bilateral trapezius muscles and left low back.  Cognition is at  baseline, although she has ongoing anxiety and is circumferential with  her thought processing.  HEART:  Regular rate.  CHEST:  Clear.  ABDOMEN:  Soft and nontender.  SKIN:  Dry.  No color or temperature changes are noted.  Weight appears  generally stable.  She had pain in the lumbar spine with flexion.  She  also has some discomfort with resistance of her lower extremity today.   ASSESSMENT:  1. Fibromyalgia with myofascial pain.  2. Migraine headaches.  3. Right carpal tunnel syndrome.  4. History of lumbar disk disease and radiculopathy.  5.  Anxiety with depression.  6. Insomnia unlikely restless leg syndrome related to fibromyalgia.   PLAN:  1. Due to her recent balance issues, I recommended that she backed off      the Neurontin at least 600 mg initially.  She may need to down      further.  We could consider low dose ReQuip for her restless leg      symptoms as well.  I found a lot of luck with this for treatment of      fibromyalgia-related pain also.  2. I injected five trigger points again at familial areas including      left trapezius muscle, bilateral splenius capitis muscle, left      quadratus lumborum/multifidus area.  Each trigger point with 2 mL      of 1% lidocaine.  The patient tolerated with minimal issues.  3. Continue fentanyl patch 25 mcg q.72 h. and Darvocet-N 100 one q.8      h. p.r.n.  4. We will see her back in 3 months with 59-month nursing followup.      The patient will follow up with Dr. Sandria Manly regarding the above.  The      patient also need followup regarding mild anemia noted on this      workup as well.  Ranelle Oyster, M.D.  Electronically Signed     ZTS/MedQ  D:  04/30/2009 11:39:04  T:  05/01/2009 05:53:36  Job #:  [id number]   cc:   Evie Lacks, MD  Fax: [phone]

## 2011-02-14 NOTE — Procedures (Signed)
NAMELATOI, GIRALDO NO.:  [id number]   MEDICAL RECORD NO.:  [id number]           PATIENT TYPE:   LOCATION:                                 FACILITY:   PHYSICIAN:  Ranelle Oyster, M.D.DATE OF BIRTH:  November 11, 1955   DATE OF PROCEDURE:  03/25/2008  DATE OF DISCHARGE:                               OPERATIVE REPORT   PROCEDURE:  Trigger point injections, diagnostic code 723.9, myofascial  pain of the cervical/shoulder girdle muscles.   DESCRIPTION OF PROCEDURE:  After informed consent, we localized the  trigger points in the middle and lower sternocleidomastoid as well as  the middle trapezius, totaling 4.  Using a total of 6 mL of 1%  lidocaine, we injected the 4 trigger points.  Twitch response was seen,  particularly in the trapezius areas.  The patient had a nice response  from a pain standpoint and had significant relief before she left the  office today, rating her pain 3 to 4 out of 10.   The patient incidentally had lumbar epidural steroid injection on Monday  and did very well with this as well.   Encourage exercise and range of motion.  Continue the same medications  for now.  I will see her back in 2 months, with nurse clinic followup in  1 month's time.      Ranelle Oyster, M.D.  Electronically Signed     ZTS/MEDQ  D:  03/25/2008 10:06:52  T:  03/26/2008 02:00:10  Job:  [id number]

## 2011-02-14 NOTE — Procedures (Signed)
NAMERHEANNON, Patty NO.:  [id number]   MEDICAL RECORD NO.:  [id number]          PATIENT TYPE:  REC   LOCATION:  [facility]                          FACILITY:  [facility]   PHYSICIAN:  Erick Colace, M.D.DATE OF BIRTH:  1956/04/06   DATE OF PROCEDURE:  03/23/2008  DATE OF DISCHARGE:                               OPERATIVE REPORT   This is a left paramedian L4-L5 translaminar lumbar epidural steroid  injection under fluoroscopic guidance.   INDICATIONS:  Lumbar radiculitis at L4-L5 levels with recent MRI  demonstrating L4-L5 disk protrusion causing some narrowing of the neural  foramina at left L4-L5 level greater than right side.   Pain interferes with self-care mobility as well as exercise.   She has come off her indomethacin prior to injection.  No antibiotics.  No fevers.   Informed consent was obtained after describing risks and benefits of the  procedure with the patient.  These include bleeding, bruising,  infection, and temporary or permanent paralysis.  She elects to proceed  and has given written consent.  The patient was placed in prone on the  fluoroscopy table.  Betadine prep and sterile drape.  A 25-gauge 1-1/2  inch needle was used to anesthetize the skin and subcutaneous tissue, 1%  lidocaine x2 mL, then a 18-gauge Tuohy needle was inserted under  fluoroscopic guidance targeting the inferior lamina of L4, bone contact  made, needle redirected inferiorly and medially and advanced under  lateral imaging and loss-of-resistance technique, positive loss of  resistance obtained.  Omnipaque 180 under live fluoro demonstrated good  epidural spread followed by injection of 2 mL of 4 mg/mL Depo-Medrol and  2 mL of 1% MPF lidocaine.  The patient tolerated the procedure well.  Pre and postinjection vitals were stable.  Post injection instructions  were given.  Follow up with [name].  She requests trigger point  injection next if available.      Erick Colace, M.D.  Electronically Signed     AEK/MEDQ  D:  03/23/2008 13:30:44  T:  03/24/2008 03:15:34  Job:  [id number]

## 2011-02-14 NOTE — Procedures (Signed)
Patty Salas, PHUNG NO.:  [id number]   MEDICAL RECORD NO.:  [id number]          PATIENT TYPE:  REC   LOCATION:  [facility]                          FACILITY:  [facility]   PHYSICIAN:  Ranelle Oyster, M.D.DATE OF BIRTH:  January 06, 1956   DATE OF PROCEDURE:  09/18/2007  DATE OF DISCHARGE:                               OPERATIVE REPORT   PROCEDURE:  Trigger point injection, diagnostic code for injections is  723.9 and 720.1.   DESCRIPTION OF PROCEDURE:  After informed consent, preparation of the  skin with isopropyl alcohol, we injected the left trapezius and left  semi-capitus muscles with a total of 6 units of 1% lidocaine.  I also  injected the right trapezius with 1 mL 1% lidocaine.  I injected the  left lower lumbar paraspinals and quadratus lumborum muscles with 2 mL  on the left and 1 mL on the right.  The patient tolerated the procedure  well without any side effects.  We discussed the follow up plan.  I will  see her back as scheduled over the next month to two months' time.      Ranelle Oyster, M.D.  Electronically Signed     ZTS/MEDQ  D:  09/18/2007 11:41:13  T:  09/18/2007 16:49:54  Job:  [id number]
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
## 2011-02-14 NOTE — Assessment & Plan Note (Signed)
Patty Salas is back regarding her multiple pan issues.  She has actually  has been doing fairly well over the last month.  Dr. Sandria Manly made some  changes in her migraine medications as she is back on verapamil at  bedtime and she seems to be doing a bit better with this, was both from  an intended and side effect point of view.  She has been exercising  more.  She likes her new shoes that she has gotten both from a comfort  and stability standpoint.  She has not had a fall since Christmas.  She  tries to walk 20 minutes usually 3-4 days a week, if not more.  She is  requesting handicap sticker today.  We performed trigger points when I  last saw her in December with good results.  She has some familial areas  in the left and right suboccipital and trapezius regions as well as the  low back/lumbar areas.  Sleep is fair.  Mood has been improving for the  most part, but she still has some depression and anxiety.   REVIEW OF SYSTEMS:  Notable with the above with bladder control problems  as well as spasms, dizziness, numbness, poor appetite at times.  She has  lost some weight.   SOCIAL HISTORY:  Patient is married, living with her husband.  She seems  to be doing a bit better there, although there still are issues.   PHYSICAL EXAMINATION:  Blood pressure is 116/54.  Pulse is 94.  Respiratory rate 18.  She is satting 94% on room air.  Patient is more  loose than I have seen her and more focused as well.  Balance is good.  She has lost weight.  She has familiar tender points in the trapezius  areas bilaterally as well as the splenius capitis regions.  The right  lower lumbar paraspinals were also tender and trigger points were  palpated.  Strength is generally 5/5 with fair sensation and  coordination today.  HEART:  Regular.  CHEST:  Clear.  ABDOMEN:  Soft, nontender.   ASSESSMENT:  1. Fibromyalgia syndrome.  2. Myofascial pain.  3. Migraine headaches.  4. Depression with anxiety.   PLAN:  1. After informed consent, we injected the two splenius capitis and      two trapezius trigger points today, each with 2 mL 1% lidocaine.      Also injected the left lumbar paraspinals with the similar      solution.  Patient experienced no effects.  2. We discussed the importance of appropriate diet and exercise.  I      like the fact that she is increasing her exercise times and      tolerance.  3. Refilled fentanyl patch 25 mcg q. 72 hours today.  4. Refilled Darvocet-N 100 but decreased the number to 80 today.  5. I filled out two handicap parking forms for patient today.  6. I will her back in about 3 months time with nurse clinic follow up      in 1 month.      Ranelle Oyster, M.D.  Electronically Signed     ZTS/MedQ  D:  12/20/2007 14:37:47  T:  12/20/2007 16:58:46  Job #:  [id number]
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
## 2011-02-14 NOTE — Assessment & Plan Note (Signed)
She traces back referring her multiple pain complaints.  She is having  more problems with the wrist and anything at this point though having  some of her usual myofascial issues.  She rates her pain at 9/10.  She  is having numbness and pain in the right thumb and first 2 fingers.  Weather definitely makes her symptoms worse with the cold damp  temperatures outside.  She describes pain as sharp, burning, stabbing.  Pain interferes with general activity, relations with others, enjoyment  of life on 9/10 level.  Sleep is fair.  She is trying to cut back on her  indomethacin.  She still uses fentanyl patch 25 mcg q.72 h. with  Darvocet for breakthrough pain 1 q.6-12 h p.r.n.   REVIEW OF SYSTEMS:  Notable for bowel and bladder control issues,  irritable bowel symptoms, depression, anxiety, low back spasms.  Complains of some nausea, vomiting, occasional skin rash, dryness, limb  swelling.  Other pertinent positives are above and full review is in the  written history section of the chart.   Social history is notable for she and her husband having improved  relations to a certain extent, which she is pleased about.   PHYSICAL EXAMINATION:  VITAL SIGNS:  Blood pressure is 128/62, pulse 99,  respiratory rate 18, she is sating 95% on room air.  GENERAL:  The patient is pleasant, alert, and oriented x3.  Affect is  quite appropriate.  Gait is generally stable.  She is somewhat anxious  as usual.  EXTREMITIES:  She has trigger points throughout the bilateral splenius  capitis muscles, right mid trapezius, and left quadratus lumborum/lumbar  paraspinal area.  She has positive Tinel sign at the right wrist today.  No wasting was seen.  She had some minimal sensory loss in the right  median nerve distribution.  NEUROLOGIC:  Affect is generally appropriate, otherwise.  Cognition is  stable.  HEART:  Regular.  CHEST:  Clear.  ABDOMEN:  Soft, nontender.   ASSESSMENT:  1. Fibromyalgia  syndrome.  2. Myofascial pain.  3. Migraine headaches.  4. Right carpal tunnel syndrome.  5. History of lumbar disk disease and radiculopathy.  6. Anxiety with depression.   PLAN:  1. We injected 4 trigger points today including the bilateral splenius      capitis, right mid trapezius, and left quadratus      lumborum/multifidus area.  The patient tolerated well without any      obvious side effects.  She is experiencing some relief in the      trigger points almost immediately.  2. Additionally, we injected the right carpal tunnel using 2 mL 1%      lidocaine and 40 mg Kenalog.  The patient experienced relief after      a few minutes in this area as well.  We will set the patient up for      nerve conduction studies of both upper extremities within the next      month or so.  3. Refill fentanyl patch 25 mcg q.72 h., #10.  4. Follow up with Dr. Sandria Manly regarding headaches.  5. Encouraged less use of indomethacin.  6. Consider lumbar back injections as symptoms seem to be increasing      there a bit.  7. I will see her back pending the above.      Ranelle Oyster, M.D.  Electronically Signed     ZTS/MedQ  D:  11/10/2008 12:59:26  T:  11/11/2008 02:20:39  Job #:  [id number]   cc:   Genene Churn. Love, M.D.  Fax: [phone]

## 2011-02-14 NOTE — Assessment & Plan Note (Signed)
Patty Salas is back regarding chronic pain.  Dr. Sandria Manly continues to follow her  for her migraine headaches.  She notes that she has been having some  left-sided facial numbness as well as left arm and leg numbness  associated with the events.  He did an MRI of her head on March 6 which  showed some nonspecific white matter changes but no obvious plaques or  lesions.  There was also an MRI of the lumbar spine done.  It explains  the leg pain.  I did review these in depth and noted that there is an L4-  L5 centralized broad-based disk bulge with lateralization to the left  and impingement upon the neural foramina and left L4 nerve root.  The  patient states that she wants to come off of pain medications and wants  to get more precision with her exercise and diet.  She states that the  relationship between she and her husband is improving somewhat.  Her  sleep is fair.   REVIEW OF SYSTEMS:  Notable for bladder control problems, depression,  spasms, nausea, vomiting, weight gain, abdominal pain.  Other pertinent  problems are as listed above and full reviews are in the written health  history section of the chart.   SOCIAL HISTORY:  As noted above.  She is still living with her husband.  She does not drink or smoke.   PHYSICAL EXAMINATION:  Blood pressure is 127/58, pulse is 95,  respiratory rate 18.  She is sating 96% on room air.  The patient is pleasant, alert and oriented x3.  Affect is bright and  appropriate.  She has a well documented trigger and tender points today.  Trigger points are in the bilateral trapezius and upper cervical  paraspinal muscles.  The left quadratus lumborum is also tender and  tight to touch.  Strength remains 5/5, normal sensation in cranial nerve  function today.  HEART:  Regular.  CHEST:  Clear.  ABDOMEN:  Soft, nontender.   ASSESSMENT:  1. Fibromyalgia.  2. Myofascial pain.  3. Migraine headaches.  4. Depression with anxiety.  5. L4-L5 disk disease with  left L4 nerve root involvement.   PLAN:  1. With informed consent, we injected multiple trigger points      including bilateral splenius capitus, bilateral trapezius, and left      quadratus lumborum using 2 cc of 1% Lidocaine.  Aspiration      technique was utilized prior to each injection.  The patient      tolerated these without issue.  Next refilled fentanyl and Darvocet      for now.  2. I will set her up for an L4-L5 left of center translaminar      injection with Dr. Wynn Banker.  I think this will be beneficial for      her left leg and low back pain.  3. Encourage exercise and diet.  4. I will see her back pending the above.      Ranelle Oyster, M.D.  Electronically Signed     ZTS/MedQ  D:  02/12/2008 11:43:41  T:  02/12/2008 12:48:43  Job #:  [id number]   cc:   Evie Lacks, MD  Fax: [phone]   Erick Colace, M.D.  Fax: [phone]
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
## 2011-02-14 NOTE — Assessment & Plan Note (Signed)
Patty Salas is back regarding her multiple pain issues regarding  fibromyalgia, myofascial pain, etc.  She is having some flare-ups of her  familiar trigger points once again.  She has responded well to trigger  point injections in the past.  She is complaining of some carpal tunnel  syndrome symptoms.  She is wearing a splint more.  I have called her in  to Med[facility] tapers.  The pain goes from a 7-9/10.  Described as sharp,  stabbing, and constant.  Pain interferes with her general activity, in  relationship with others, and enjoyment of life on a moderate level.  Sleep is fair.  Pain increases with general activity.  It usually  increases with rest.  She uses her Darvocet and fentanyl patch currently  as written, and she has been reliable with these.  She is on a 25 mcg  fentanyl patch currently.  She tries to exercise somewhat, but is  limited at times due to pain in her feet.   REVIEW OF SYSTEMS:  Notable for the above.  She does have some  occasional spasms, left-sided facial palsy at times.  She has had  overall weight loss.  She is doing better emotionally at home with her  family, although her husband has some health issues currently.  She has  ongoing issues regarding kidney stones, which are followed by Dr.  Vonita Moss.  Full review is in the written health and history section.   SOCIAL HISTORY:  The patient is married and lives with her husband  currently.   PHYSICAL EXAMINATION:  VITAL SIGNS:  Blood pressure is 121/61, pulse 97,  respiratory rate 18.  She is sating 97% on room air.  GENERAL:  The patient is pleasant, alert, and oriented x3.  The patient  has trigger points, which are palpable and painful at the bilateral  splenius capitis areas, mid trapezius, and left quadratus lumborum  region.  Mood is good.  Affect is appropriate.  She is focused.  She is  less tangential overall.  HEART:  Regular.  CHEST:  Clear.  ABDOMEN:  Soft and nontender. Weight is stable.  SKIN:   Remains dry.  NEURO:  Cognitively, she is intact.   ASSESSMENT:  1. Fibromyalgia.  2. Myofascial pain.  3. Migraine headaches.  4. Carpal tunnel syndrome, right upper extremity.  5. History of lumbar disk disease and radiculopathy.  6. Anxiety and depression.   PLAN:  1. We injected 5 trigger points today using 2 mL of 1% lidocaine in      the areas noted above.  The patient tolerated well and had relief      before leaving the office today.  2. I refilled fentanyl patch post dated this September 13, 2008.  3. Advised ongoing use of carpal tunnel splint.  Consider wrist block      depending on symptoms.  She does not feel that overall, it is      substantially worse than it was few years ago when she had her last      nerve conduction study,      but may want to consider repeat nerve conduction testing.  4. I will see her back in 3 months with nurse followup in 1 month.      Ranelle Oyster, M.D.  Electronically Signed     ZTS/MedQ  D:  09/09/2008 09:48:08  T:  09/09/2008 23:57:45  Job #:  [id number]   cc:   Genene Churn. Love, M.D.  Fax: [phone]

## 2011-02-14 NOTE — Procedures (Signed)
NAMETHANVI, BLINCOE NO.:  [id number]   MEDICAL RECORD NO.:  [id number]           PATIENT TYPE:   LOCATION:                                 FACILITY:   PHYSICIAN:  Ranelle Oyster, M.D.DATE OF BIRTH:  September 27, 1956   DATE OF PROCEDURE:  07/08/2008  DATE OF DISCHARGE:                               OPERATIVE REPORT   PROCEDURE:  Trigger point injections.   DIAGNOSIS CODES:  723.9, 720.1.   Myofascial pain of cervical spine and trapezius areas as well as the  lumbar myofascial pain.   DESCRIPTION:  After informed consent, we localized for a separate  trigger points in familiar areas for [facility] along the bilateral splenius  capitis muscles as well as the right trapezius and left quadratus  lumborum.  The patient was cleaned with isopropyl alcohol and after  aspiration and we injected 2 mL of 1% lidocaine into each spot.  Noted  trigger point twitch at 3/4 spots today.  The patient tolerated well.  She is given post-injection instructions and we will see her back at her  scheduled appointment.      Ranelle Oyster, M.D.  Electronically Signed     ZTS/MEDQ  D:  07/08/2008 09:16:32  T:  07/08/2008 12:10:21  Job:  [id number]

## 2011-02-14 NOTE — Assessment & Plan Note (Signed)
Patty Salas is back regarding her fibromyalgia and myofascial pain.  She has  had a fair summer for the most part.  She is still having occasional  migraines.  Pain ranges from 6-9/10.  We were trying to work on weaning  pain medications in general.  She is trying to focus on more exercise.  She is on a fentanyl 25 mcg patch with Darvocet for breakthrough pain.   Pain is described as sharp and constant.  Sleep is fair.  She is getting  along better with her husband apparently.   REVIEW OF SYSTEMS:  Notable for the above as well as spasms, IBS  symptoms, occasional anxiety, abdominal pain, and weight loss.   SOCIAL HISTORY:  The patient is married.  Living with her husband as  above.   PHYSICAL EXAMINATION:  VITAL SIGNS:  Blood pressure is 141/55, pulse is  95, respiratory rate 18, and she is saturating 98% on room air.  GENERAL:  The patient is generally pleasant, alert and oriented x3.  She  has been more focused today.  HEART:  Regular rate.  CHEST:  Clear.  ABDOMEN:  Soft and nontender.  She walks with a good balanced gait and maybe some slight antalgic to  the right.  She has some trigger point notable once again in the  bilateral trapezius, mid belly, as well as left upper trapezius, and  left lower lumbar paraspinal area.  Strength is 5/5.  Sensory exam is  intact.  No focal cranial nerve findings were noted today.  Cognitively,  she is appropriate.   ASSESSMENT:  1. Fibromyalgia.  2. Myofascial pain.  3. Migraine headaches.  4. Depression with anxiety.  5. L4-L5 disk disease with left L4 nerve root involvement.   PLAN:  1. After informed consent, we injected multiple trigger points today      in the bilateral trapezius muscle as well as left lower lumbar      paraspinals totaling 4.  We used 2 cc of 1% lidocaine in each.      Aspiration technique was utilized prior.  2. Continue Darvocet and fentanyl as written.  Goal will be to      decrease fentanyl to a 12.5 mcg  patch.  3. I will refill Lidoderm.  4. Discussed the possibility of repeating lumbar injections, and I      like to hold off until there is a more of an urgent need.  She      agrees.  5. Encouraged appropriate diet and exercise activities.  I like to see      her also wean off the Indocin due to stomach      and renal toxicity issues.  6. I will see the patient back in 3 months' time with nurse clinic      followup next month.      Ranelle Oyster, M.D.  Electronically Signed     ZTS/MedQ  D:  05/19/2008 12:37:55  T:  05/20/2008 03:34:11  Job #:  [id number]   cc:   Dr. Fayrene Fearing Well

## 2011-02-14 NOTE — Assessment & Plan Note (Signed)
Patty Salas is back regarding her fibromyalgia and multiple pain complaints.  She had this where she way of broken her second left toe, was having  some increased pain with that.  She is also having some more back pain.  She has gone back on Indocin over a year of gout and pain in the feet  when coming to the office.  She remains on the fentanyl patch as well as  Darvocet for breakthrough pain.  She states that her marital life has  been better.  She continues to have migraine headaches with associated  facial palsy on the left.  Her pain is 9/10 and she described as sharp  burning, stabbing, and constant.  Pain interferes with general activity,  relations with others, and enjoyment of life on a severe level.   REVIEW OF SYSTEMS:  Notable for the above.  Other positive items are  detailed in our history of present illness section.   FAMILY HISTORY:  Unchanged.   PHYSICAL EXAMINATION:  VITAL SIGNS:  Blood pressure is 120/77, pulse  101, and respiratory rate is 18.  She is sating 98% on room air.  GENERAL:  The patient is pleasant, alert, and oriented x3.  She is  walking using her walker for balance due to pain in her back.  She has  multiple tender points as usual.  Although she has a new one under the  left inferior border of the scapula.  NEUROLOGIC:  Cognition is at baseline with a substantial anxiety still  noted.  CHEST:  She has tachycardia on chest exam, although chest is clear.  ABDOMEN:  Soft, tender.  Weight is stable.  EXTREMITIES:  The patient did have more pain with forward flexion than  extension today.   ASSESSMENT:  1. Fibromyalgia.  2. Some myofascial pain.  3. Migraine headaches.  4. Right carpal tunnel syndrome.  5. Lumbar disk disease radiculopathy.  6. Anxiety with depression.   PLAN:  1. I performed 5 trigger points injections today including bilateral      trapezius, right splenius capitis, left quadratus      lumborum/multifidus of this area as well as the  left lower      rhomboid.  2. We will hold on any increase in her fentanyl patch.  I think a      followup lumbar epidural injection may be in order for her back      pain, but she would need to come off the Indocin first.  3. I refilled Darvocet-N 100, #90.  4. We will see her back in 3 months with 59-month followup in nurse      clinic.      Ranelle Oyster, M.D.  Electronically Signed    ZTS/MedQ  D:  02/05/2009 13:49:25  T:  02/06/2009 03:26:41  Job #:  [id number]   cc:   Genene Churn. Love, M.D.  Fax: [phone]

## 2011-02-17 NOTE — Assessment & Plan Note (Signed)
Patty Salas is back regarding her chronic pain and fibromyalgia.  We had injected  her back at the last visit for a presumptive myofascial/muscle strain in the  left parascapular region.  She had good results for a few days until threw  some clothing in an object with a jerking motion and had return of her pain.  She had MRIs of the cervical spine performed and these showed no significant  disease.  I do not have the reports here to reference, but that is my  recollection.  The patient has seemed to escalate a great deal with this  pain from a standpoint of her emotions and calls to our office.  She has had  some problems with her feet and great toes, which initially were thought to  be gout.  X-rays have really shown otherwise.  She has had normal uric acid  level.   I had the chance to discuss her case with Dr. Melvyn Novas two weeks ago as I felt  that her behavior has become increasingly manic.  I thought that she might  benefit from seeing Dr. Betti Cruz sooner than later.   Today the patient seems a little bit calmer in her recollection of things.  She reports that she wants to be off of the Darvocet and actually has used  less Darvocet over the last two weeks or so.  She is only using two or three  a day.  She rates her pain on an average of 7-8/10 in multiple spots and was  predominantly in the left upper back region.  The area is right near the  inferior border of her scapula by her report.  She has an appointment to see  Dr. Betti Cruz next week apparently.  She is interested in seeing Dr. Leonides Cave for  emotional support, although I believe that she already has a psychologist in  place.   REVIEW OF SYSTEMS:  The patient denies any new bowel or bladder complaints.  Sleep has been fair.  Emotionally she feels a bit more relaxed.  She has had  some weight gain.  She is getting some sleep at night.   PHYSICAL EXAMINATION:  On physical examination today, the patient remains  anxious, but in no acute  distress.  The blood pressure is 137/81, the pulse  is 102, and she is saturating 98% on room air.  The patient had tenderness  throughout the cervical and trapezius regions today with pain also along the  spinous processes of this region.  The patient had poor lateral bending and  rotation to the right of the thoracic spine.  She had significant pain with  palpation along the inferior angle of the scapula at the medial edge.  When  I pressed on this area, this seemed to reproduce her pain.  Motor and  sensory testing were normal.  She had familiar tender points throughout the  upper and lower extremities today.  Cognitively she was appropriate other  than being excitable and manic in appearance.   ASSESSMENT:  1. Fibromyalgia.  2. Myofascial pain.  3. History of bipolar disorder.  4. History of cervical disk and joint disease.  5. Restless leg syndrome.   PLAN:  1. After informed consent, we injected the left subscapular trigger point     today with 3 mL of 1% lidocaine.  The patient tolerated this well.  I     asked her to apply ice to the area as needed.  She would benefit from an  outpatient physiotherapy program to address stretching range of motion     and modalities as indicated to the left shoulder region.  2. Will continue to discourage Darvocet use and will hopefully titrate this     medicine to off over the next coming months.  3. Will increase her Neurontin to 800 mg q.i.d.  She is also on Gabitril per     Dr. Betti Cruz and I would wonder if we could take her off of this.  4. I asked her to follow through with Dr. Reddy's visit.  I think addressing     some of the psychological issues at this point are paramount to her care     and outcome.  She certainly escalates her pain and symptoms depending on     her     mood.  5. I will see the patient back in approximately six weeks' time.      Ranelle Oyster, M.D.   ZTS/MedQ  D:  10/16/2003 11:33:50  T:  10/16/2003  12:42:12  Job #:  [id number]   cc:   Gregary Signs A. Everardo All, M.D. [facility]   Daine Floras, M.D.  [address]  [place]  [place] [address]  Fax: [phone]
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
## 2011-02-17 NOTE — Consult Note (Signed)
[name], Salas NO.:  [id number]   MEDICAL RECORD NO.:  [id number]          PATIENT TYPE:  EMS   LOCATION:  ED                           FACILITY:  [facility]   PHYSICIAN:  Marlan Palau, M.D.  DATE OF BIRTH:  [birth year]   DATE OF CONSULTATION:  06/22/2004  DATE OF DISCHARGE:                                   CONSULTATION   HISTORY OF PRESENT ILLNESS:  Patty [place]. Patty Salas is a 55 year old, right-handed,  white female, born 1956-02-27, with a very, very complicated past  medical history.  This patient has a history of depression and has been  followed by psychiatry in the past for this as well as anxiety problems.  This patient has been followed by Dr. Sandria Manly for frequent migraine headaches  that have been associated with some left-sided numbness, particularly  involving the face.  The patient claims that she has had left-sided numbness  since February 2005.  This patient comes to the emergency room after she  noted this morning that she had a wrist drop involving the left hand.  The  patient does not believe she was napping prior to the onset of this event  but just noticed the wrist drop when she tried to reach over and grab a  glass.  The patient notes numbness and tingling sensations down the arm, no  pain per se in the arm.  The patient has not had any changes in gait,  vision, or change in her usual headache.  The patient comes to the [facility] Emergency Room.  A CT scan of the brain was done and was unremarkable.  Neurology was asked to see this patient for further evaluation.   PAST MEDICAL HISTORY:  1.  New onset of left wrist drop.  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  2.  Significant history of depression, anxiety.  3.  History of fibromyalgia.  4.  Chronic fatigue syndrome.  5.  Restless leg syndrome.  6.  Irritable bowel syndrome.  7.  Gout.  8.  Frequent bladder infections.  9.  Renal calculi.  10. Cervical spine surgery.  11. Carpal tunnel syndrome bilaterally.  12. History of right rotator cuff syndrome.  13. History of gallbladder resection.  14. History of appendectomy.  15. Hysterectomy.  16. Tonsillectomy.   CURRENT MEDICATIONS:  1.  Neurontin 800 mg 1 in the morning, 2 in the evening.  2.  Effexor 150 mg daily.  3.  Lorazepam 1 mg b.i.d.  4.  Flexeril 10 mg q.8h.  5.  Iron 325 mg daily.  6.  Darvocet if needed.  7.  Promethazine 25 mg.  8.  __________ 5 mg if needed.  9.  Gabitril 4 mg 2 q.h.s.  10. Lidocaine patches.  11. Allopurinol 300 mg daily.  12. Indocin 50 mg p.r.n. for gouty attacks.  13. Canasa suppositories if needed.  14. Fentanyl patch 50 mcg every 3 days.  15. Lasix 40 mg daily.   ALLERGIES:  The patient has multiple medication allergies including:  1.  PENICILLIN.  2.  DURACT.  3.  MELLARIL.  4.  NONSTEROIDAL ANTI-INFLAMMATORY MEDICATIONS.  5.  TAPE ALLERGIES.  6.  REGLAN.  7.  LIPITOR.  8.  HYDROCHLOROTHIAZIDE.  9.  STELAZINE.  10. THORAZINE.  11. __________.  12. IVP DYE.  13. SEROQUEL.  14. AVELOX.  15. ERYTHROMYCIN.  16. DIFLUCAN.  17. DOXYCYCLINE.  18. FLAGYL.  19. UROCIT K.  20. DEPAKOTE.  21. RISPERDAL.  22. CAPSAICIN CREAM.  23. IMITREX.  24. BARIUM.   SOCIAL HISTORY:  This patient is married, lives in the [place] area, has one child that died following a motor vehicle  accident, had spinal cord injury, died from sepsis.  The patient is not  employed.   FAMILY MEDICAL HISTORY:  Mother died with lung cancer.  Father is alive age  4.  The patient has two brothers with hypertension.   REVIEW OF SYMPTOMS:  Notable for some occasional fevers, possibly some  recent cellulitis in the legs.  The patient has ongoing neck pain, shortness  of breath, denies chest pain, does have some nausea and vomiting.  Does note  some urgency.  Has irritable bowel syndrome.  Had near-syncope.  The patient  denies any history of seizures.    PHYSICAL EXAMINATION:  VITAL SIGNS:  Blood pressure 118/72, heart rate 99,  respiratory rate 18, temperature 100.3.  GENERAL:  This patient is a fairly well-developed, white female who is  alert, cooperative at the time of examination.  HEENT:  Head is atraumatic.  Eyes:  Pupils are equal, round, and reactive to  light.  Discs are flat bilaterally.  NECK:  Supple.  No carotid bruits noted.  RESPIRATORY:  Clear.  CARDIOVASCULAR:  Regular rate and rhythm, no obvious murmurs or rubs noted.  EXTREMITIES:  Notable for some erythema halfway below the knees bilaterally,  1+ edema bilaterally at ankles.  NEUROLOGIC:  Cranial nerves as above.  Facial symmetry is present.  The  patient notes decreased pinprick sensation of the left face compared to the  right, splits midline with vibratory sensation of the forehead.  The patient  has full extraocular movements.  Visual fields are full.  Speech is well-  enunciated, not aphasic.  Motor testing does reveal a wrist drop on the  left, poor extension of the fingers with the wrist placed into extension.  The patient has good abduction of the fingers on the left hand.  The patient  has incomplete activation of the brachioradialis muscle on the left, normal  on the right, good triceps strength on both sides.  Otherwise, strength with  grip and with the rest of the left upper extremity is completely normal.  The strength of the right arm is normal.  The strength of the legs is  normal.  On sensory examination, the patient is noted to have a decreased  pinprick sensation throughout on the left arm, left legs.  The right  vibratory sensation is depressed in the left arm, left legs.  The right -  the patient has good finger-to-nose-to-finger, toe-to-finger bilaterally.  The patient is not ambulated.  Deep tendon reflexes are symmetric, normal.  Toes are neutral bilaterally.  LABORATORY VALUES:  Notable for sodium 137, potassium 3.4, chloride 98, CO2  32,  glucose 104, BUN 7, creatinine 0.6, calcium 9.2, white count 6.3,  hemoglobin 11.2, hematocrit 34.3, MCV 96.2, platelets 379.  CT of the head  is unremarkable.   IMPRESSION:  1.  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  Cause is not delineated from history.  2.  Multiple somatic complaints.  3.  History of migraine.  4.  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  The patient may be embellishing her      examination.   This patient appears to have a lot of functional features with the clinical  history and examination, but the examination does support a Saturday night  palsy.  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  There  appears to be no clear evidence of compressive problem.  Will need to  further evaluate this patient by EMG nerve conduction study as an outpatient  within the next 2 weeks or so.  EMG evaluation cannot be done at this point,  as it is too early.  Need to rule out a modernized multiplex problem.   PLAN:  1.  Discharge the patient to home.  2.  Follow up [facility] in a couple of weeks for an EMG      nerve conduction study.  The patient to contact our office if any new      problems arise.      CKW/MEDQ  D:  06/22/2004  T:  06/23/2004  Job:  [id number]   cc:   Holley Bouche, M.D.  [address]  [place], [place] [address]  Fax: [phone]

## 2011-02-17 NOTE — Assessment & Plan Note (Signed)
Patty Salas is back regarding her fibromyalgia and chronic pain.  She had been  doing fairly well from a standpoint of her fibromyalgia but she has reported  recent flare of her gout involving her right foot and her knees to a  slightly lesser extent.  She has been placed on colchicine which she had  problems tolerating.  She is currently on a prednisone taper over the course  of approximately three weeks' time.  She is maintained on allopurinol and  Indocin currently as well.  She has not had a uric acid level checked, nor x-  rays of the foot.  She complains of pain in the right first MTP joint and  the right heel.  Pain is worse when she walks.  She is using increased  Darvocet due to her pain.   She has also reported problems with reported kidney stones.  She is being  seen by Dr. Vonita Moss regarding these stones.  She has had a recent CT scan  and further work-up is under way.  Patty Salas rates her pain overall at an 8 to  10/10.  The knees and ankles are the most prominent causes of this pain at  this point.  She has also had some flank pain which was related to these  reported stones.  She is currently on Levaquin to treat her urine and fever.   REVIEW OF SYMPTOMS:  Bladder issues were mentioned above.  She has no focal  bowel complaints.  She denies chest pain or shortness of breath.  She has  had decreased energy.  She has gained weight with oral steroids.  Mood has  been a little more anxious and she has been depressed more at times.   PHYSICAL EXAMINATION:  GENERAL APPEARANCE:  The patient is anxious as usual.  She is alert and oriented x3.  VITAL SIGNS:  Blood pressure stable.  She is afebrile.  NEUROLOGIC:  She had continued tenderness in the lumbar and thoracic spines  and shoulders to a lesser extent.  She had particular pain with palpation of  the knees which had noted peripatellar swelling.  Pain with resisted  extension and flexion as well as rotation at the knees.  Right ankle  was red  and slightly warm at the first MTP and painful to touch.  She also had  tenderness on palpation along the right calcaneus and plantar fascia.  Both  of her arches are flat.  Skin was generally dry. She had some redness along  the bottom of the left foot as well with hypersensitivity.   ASSESSMENT:  1. Fibromyalgia.  2. Myofascial pain.  3. Acute gouty flare which has been resistant to treatment apparently and     certainly exacerbated by her fibromyalgia picture.  4. History of cervical disk and joint disease.  5. History of restless leg syndrome.  6. History of right __________ tendinitis and rotator cuff syndrome.  7. Post traumatic stress disorder.  8. Bipolar disorder.   PLAN:  1. After informed consent, we injected both knees today with 60 mg Kenalog     and 3 mL of 1% lidocaine.  The patient tolerated these well.  2. I would like to get an x-ray of the right foot to see if there is any     erosive arthritis of the right first toe and elsewhere in the foot.  I     would also like to look for bone spurs in the calcaneal region.  3. I would like  to check a uric acid level today.  4. Refill Darvocet for her breakthrough pain.  The use of this is increased     at least for the last few weeks due to her foot and ankle issues.  Her     goal ultimately still is to decrease this off.  If she needs something     further for pain relief, we may consider other medication options.  5. Will continue prednisone per Dr. Raphael Gibney office.  6. She may apply ice periodically to the foot for pain relief.  7. She would benefit from insert in her shoe to help support her arch and     possibly the metatarsals and calcaneal region.  8. I would like her to restart her exercise program but this has been     difficult secondary to the recent flare.  9. The patient will also need to follow up with Dr. Vonita Moss regarding her     kidney stones.  I would wonder if some of these stones may be uric  acid     stones.  10.      I will see the patient back in about four to six weeks' time.      Ranelle Oyster, M.D.   ZTS/MedQ  D:  09/02/2003 11:59:56  T:  09/02/2003 13:08:59  Job #:  [id number]   cc:   Gregary Signs A. Everardo All, M.D. [facility]   Daine Floras, M.D.  [address]  [place]  [place] [address]  Fax: [phone]   Maretta Bees. Vonita Moss, M.D.  [address]  [place]  [place] [address]  Fax: [phone]

## 2011-02-17 NOTE — Assessment & Plan Note (Signed)
SUBJECTIVE:  Patty Salas is here after a fall with some left rib pain.  She does  not recall actual blunt trauma to the ribs but may be more of a stretching  or pulling type of injury with a twist.  She has had to use more Darvocet.  Pain is at a 7/10.   OBJECTIVE:  Physical examination today patient is pleasant in no acute  distress.  Anxious as usual.  She has pain along the 10th rib and also the  intercostal muscle and overlying latissimus musculature.  Pain is worse with  lateral bending and twisting to the right.  She has impaired range of motion  with bending to the right as well.  The pain we palpated today and  reproduced with movement reproduced the pain she has been having.   ASSESSMENT:  1. Fibromyalgia.  2. Acute muscle strain/trigger point in the left rib cage along the 10th     rib.   PLAN:  After informed consent we injected the left rib and intercostal  musculature with 2.5 mL of one percent lidocaine and 20 mg of Kenalog.  Patient had instant relief after approximately 4-5 minutes.  Informed  consent was obtained prior to the injection and the area was appropriately  prepared.   I will see the patient back at her regularly scheduled appointment in  January.      Ranelle Oyster, M.D.   ZTS/MedQ  D:  09/18/2003 09:31:50  T:  09/18/2003 10:47:30  Job #:  [id number]   cc:   Patty Salas, M.D. [facility]

## 2011-02-17 NOTE — Op Note (Signed)
NAME:  Patty Salas, Patty Salas                          ACCOUNT NO.:  [id number]   MEDICAL RECORD NO.:  [id number]                   PATIENT TYPE:  AMB   LOCATION:  DAY                                  FACILITY:  [facility]   PHYSICIAN:  Ronald L. Ovidio Hanger, M.D.           DATE OF BIRTH:  1956-09-06   DATE OF PROCEDURE:  06/26/2002  DATE OF DISCHARGE:                                 OPERATIVE REPORT   PREOPERATIVE DIAGNOSES:  1. Right ureteral lithiasis.  2. Fever.   OPERATIVE PROCEDURES:  1. Cystourethroscopy.  2. Right ureteroscopy with attempted stone basket extraction.  3. Placement right double-J stent.   SURGEON:  Lucrezia Starch. Earlene Plater, M.D.   ANESTHESIA:  MAC.   ESTIMATED BLOOD LOSS:  Negligible.   TUBES:  24-cm 6 French Bard double-pigtail stent.   COMPLICATIONS:  None.   INDICATIONS FOR PROCEDURE:  The patient is a very nice 55 year old white  female who has a known bilateral ureteral lithiasis.  She has developed  right flank pain, some nausea and vomiting, and over the last 24 hours had  continued pain.  She was seen by Loraine Leriche C. Vernie Ammons, M.D. in the office today  and felt that the right ESWL was indicated.  The stone was in the mid ureter  at that time.  She continued to have pain and when placed on the lithotripsy  table, she was found to have a temperature of 101.3 degrees Fahrenheit,  which negated the possibility of ESWL.  In addition, the KUB revealed that  the stone had moved into the right lower ureter and was quite large.  After  understanding risks, benefits, and alternatives, she would like to proceed  with the above procedure as a temporizing basis.   PROCEDURE IN DETAIL:  The patient placed in supine position.  After proper  MAC analgesia, 10 cc of Uro-jet was placed and cystourethroscopy was  performed with 22.5 French Olympus panendoscope utilizing the 12 and 7  degree lenses.  The bladder was carefully inspected.  Reflux of urine was  noted from the left ureteral  orifice.  The right ureteral orifice had very  little efflux of urine.  A .038 French Teflon-coated guidewire was placed  into the right kidney pelvis and ureteroscopy was performed.  The stone was  easily visualized and it was felt that it was oriented in the correct  direction that one attempt with a nitinol basket was indicated.  The stone  was grasped.  It was highly impacted and would not move so very little  pressure was placed.  The nitinol basket was disengaged.  It was noted that  some of the Teflon was scraped from the stent at that point and it might be  difficult placing the catheter.  Therefore, a second .038 French Teflon-  coated guidewire was placed in the right renal pelvis.  The first one was  removed and under fluoroscopic guidance,  a 24-cm 6 French Bard double-  pigtail stent was placed and noted to be in good position within the right  renal pelvis within the bladder.  The bladder was drained.  The panendoscope  was removed.  No pull-out string was attached.  The plan is to go home on  p.o. antibiotic Cipro.  She was given IV Cipro at surgery and her white  count is normal and pain medications.  She will call the office tomorrow for  a progress report.  Additionally, we will set up with either an ESWL or a  ureteroscopy and holmium laser lithotripsy.  It was felt that tonight, due  to the fever and due to the MAC analgesia, that further manipulation was  probably not indicated.  Tolerated the procedure well, was taken to recovery  stable.                                                Ronald L. Ovidio Hanger, M.D.    RLD/MEDQ  D:  06/26/2002  T:  06/26/2002  Job:  [id number]

## 2011-02-17 NOTE — H&P (Signed)
NAMEADELLE, ZACHAR NO.:  [id number]   MEDICAL RECORD NO.:  [id number]          PATIENT TYPE:  INP   LOCATION:  1830                         FACILITY:  [facility]   PHYSICIAN:  Armanda Magic, M.D.     DATE OF BIRTH:  Nov 02, 1955   DATE OF ADMISSION:  02/06/2007  DATE OF DISCHARGE:                              HISTORY & PHYSICAL   Primary care physician:  Holley Bouche, M.D., at [facility].  Cardiologist:  Armanda Magic, M.D.   REASON FOR EVALUATION:  Chest pain.   HISTORY OF PRESENT ILLNESS:  Patty Salas is a 55 year old Caucasian female  with no known history of coronary artery disease.  She has a history of  multiple comorbidities and allergies.  She complained of exertional left  breast chest pain last p.m. after sleeping.  She denied radiation of the  pain, however admits to associated shortness of breath, diaphoresis, and  nausea without vomiting.  She also admitted to being dizzy.  She  indicated that the chest pain worsens with deep inspiration, palpation,  and movement; however, there was no association after a meal.  She  indicated that this chest pain is dissimilar to previous chest pain  episodes in that is sharp this time where her prior chest pain episodes  have felt like pressure.  She was brought to the [facility] emergency  department by EMS, who gave her sublingual nitroglycerin with  improvement of her chest pain.  A 12-lead EKG upon arrival was obtained  revealing normal sinus rhythm with a ventricular rate of 91 beats per  minute.  There were no acute ischemic changes.  Point of care enzymes  were negative x1.   PAST MEDICAL HISTORY:  1. Overactive bladder.  2. Chronic migraines.  3. Fibromyalgia with myofascial pain.  4. Chronic back pain.  5. History of asthma.  6. Carpal tunnel syndrome.  7. Osteoarthritis.  8. Scoliosis.  9. Bipolar disorder.  10.Irritable bowel syndrome.  11.Insomnia.  12.Restless legs syndrome.  13.History  of VDRF secondary to tricyclic antidepressants and      benzodiazepine overdose.  14.History of syncopal episode followed by Dr. Melbourne Abts.  15.Dyslipidemia.   ALLERGIES:  1. LATEX.  2. AVELOX.  3. BEXTRA.  4. CYMBALTA.  5. DALMANE.  6. DEPAKOTE.  7. __________.  8. ENABLEX.  9. ERYTHROMYCIN.  10.ABILIFY.  11.IMITREX.  12.BARIUM.  13.OMNIPAQUE.  14.RENOGRAFIN.  15.IV CONTRAST DYE.   CURRENT MEDICATIONS:  1. Verapamil 40 mg daily.  2. Flexeril 10 mg twice daily and 5 mg at noon.  3. Lasix 40 mg daily.  4. Pro-Air two puffs as needed.  5. Zomig 5 mg as needed.  6. EpiPen as needed.  7. Indomethacin 25 mg three times daily.  8. Phenergan 25 mg as needed.  9. Lidocaine viscous this as needed.  10.Sanctura 20 mg twice daily.  11.Fentanyl patch 25 mcg every 72 hours.  12.Darvocet-N 100 one to two tablets every 4-6 hours as needed for      pain.  13.Gabapentin 100 mg four times daily.  14.Lidoderm 5% patch one  to three patches daily as needed, on in the      a.m., off in the p.m.  15.Ativan 1 mg three times daily.  16.Zithromax 250 mg x5 days secondary to a recent diagnosis of a URI.  17.Colace 100 mg twice daily.  18.Benadryl as needed.  19.Dulcolax as needed.   FAMILY HISTORY:  Mother deceased, lung cancer.  Family history is  significant for CVA, however no prior history of coronary artery disease  with the exception of her paternal grandmother.   SOCIAL HISTORY:  No tobacco, alcohol, or illicit drug use.   REVIEW OF SYSTEMS:  All other systems reviewed are negative other than  what is stated in the HPI.   PHYSICAL EXAMINATION:  GENERAL:  A 55 year old female, pleasant and  cooperative, NAD.  VITAL SIGNS:  Pulse 90, blood pressure 107/60, respirations 20, O2  saturations 95% over 2 L.  HEENT: Benign.  NECK:  Supple without JVD or bilateral carotid bruits.  Carotid  upstrokes 2+.  PULMONARY:  Breath sounds are equal and clear to auscultation  bilaterally.  No  use of accessory muscles.  CARDIOVASCULAR:  Regular rate and rhythm.  Normal S1 and S2 with a 2/6  systolic murmur.  EXTREMITIES:  No peripheral edema, cyanosis, or clubbing.  DP pulses  2+/2, bilaterally.  SKIN:  Warm and dry without rashes or lesions.  NEUROLOGIC:  No focal motor or sensory deficits.  BACK:  Positive scoliosis.  PSYCHIATRIC:  Normal mood and affect.   LABORATORY DATA:  White blood count 5.0, hemoglobin 10.2, hematocrit  29.9, platelets 352,000.  Sodium 137, potassium 3.3, chloride 103,  bicarbonate 28.2, BUN 9, creatinine 0.8, glucose 90.  PT, INR, PTT, BNP,  and magnesium are pending.  Serial cardiac enzymes are pending.  EKG as  stated in the HPI.  Point of care enzymes:  Myoglobin 67.0, CK-MB 4.8,  troponin I less than 0.05.  Chest x-ray is pending.   IMPRESSION:  1. Chest pain, atypical.  2. Hypokalemia.  3. Dyslipidemia.  4. Chronic migraine headaches.  5. Anemia, stable.  6. Fibromyalgia.  7. History of syncopal episode followed by Dr. Melbourne Abts.  8. Otherwise as stated in the hospital course.   PLAN:  1. Admit to a cardiac telemetry unit under the service of Dr. Armanda Magic with a diagnosis of chest pain.  2. Rule out MI with cardiac panel including troponin-I q.8h. x3.  3. Replete potassium with K-Dur 40 mEq x1 now.  4. Continue home medications with the exception of Vivelle Dot.  5. Initiate cardiology p.r.n. orders.  6. Start IV nitroglycerin at 10 mcg/min., keeping systolic blood      pressure greater than 100.  7. Subcu Lovenox every 12 hours as needed.  8. Daily BMET, CBC, and EKG.  9. Fasting lipid panel in the a.m.  10.Stress Cardiolite in the a.m.  11.Keep n.p.o. after midnight tonight with the exception of      medications.  Hold a.m. dosage of verapamil until after the stress      Cardiolite.  12.2-D echocardiogram for evaluation of LV function.  13.The patient was seen, interviewed, and examined by Dr. Armanda Magic, who  participated in the medical decision making and plan of      care.      Tylene Fantasia, [place]      Armanda Magic, M.D.  Electronically Signed    RDM/MEDQ  D:  02/06/2007  T:  02/06/2007  Job:  [id number]   cc:   Holley Bouche, M.D.

## 2011-02-17 NOTE — Assessment & Plan Note (Signed)
Patty Salas is back regarding her fibromyalgia syndrome.  She has been doing  fairly well except for one fall at home.  She also bent over and stood up  and hit her head on the back of a shelf.  Most of her neck p ain seems to  have resolved.  She does complain of some left shoulder pain more at the  joint itself and over the left trapezius muscle.  She has also had some  tenderness over the left low back region.  She feels better with the  decrease in her Darvocet which she continues to try to taper down.  She  feels the Cymbalta is helping her move a great deal.  She uses Lidoderm  patch for localized trigger point tenderness and tendon point tenderness.  She is also on fentanyl patch 50 mcg q.72 h. which she wants to decrease as  well.   REVIEW OF SYSTEMS:  The patient reports occasional spasm, dizziness,  depression, anxiety, loss of appetite, bowel and bladder incontinence,  weight gain, nausea, vomiting, abdominal discomfort.   SOCIAL HISTORY:  The patient is married and continues to try to work through  her relationship with her husband which has been rocky.   PHYSICAL EXAMINATION:  VITAL SIGNS: Blood pressure 108/50, pulse 100,  respiratory rate 14.  She is saturating 98% on room air.  GENERAL:  The patient is pleasant, in no acute distress.  She is alert and  oriented x3.  She is a bit more calm than her baseline.  She has normal  motor function and reflexes today.  She has good range of motion throughout  except for the left shoulder which reveals a positive impingement sign.  There is tenderness along the left trapezius muscle with associated area of  taut muscle tissue.  Left low back in the area of the obliques was tender at  2 separate points.  Tendons were nontender.  The patient had fair low back  range of motion.  Lower extremity strength and range of motion were good.  HEART:  Regular rate and rhythm.  LUNGS:  Clear.  ABDOMEN:  Soft, nontender.   ASSESSMENT:  1.   Fibromyalgia with myofascial features.  2.  Bipolar disorder.  3.  Somatic pain disorder.  4.  Migraine headaches.  5.  Restless leg syndrome.  6.  History of left rotator cuff tendinitis, improved after injection.  7.  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   PLAN:  1.  After informed consent, we injected the left shoulder with 30 mg Kenalog      and 3 mL 1% lidocaine.  The patient tolerated the well.  The patient was      injected after preparation with alcohol 3 separate trigger points of the      left trapezius and at the obliques.  The patient tolerated the well.  2.  We will decreased Darvocet to 50 1 q.6 h. p.r.n., #90.  3.  Continue fentanyl patches q.72 h. with thought of decreasing this      further.  4.  Continue exercise and other modalities for low back.  5.  I will see her back in about two months time.  She will follow up with      the RN clinic in about month.      Ranelle Oyster, M.D.  Electronically Signed     ZTS/MedQ  D:  09/05/2005 14:17:57  T:  09/05/2005 22:55:09  Job #:  [id number]

## 2011-02-17 NOTE — Assessment & Plan Note (Signed)
Patty Salas is back with increased pain in her low back and left leg today.  She  had a fall the other day with a bruise to the left arm.  She attributes the  pain to the decrease in her medication.  She saw Dr. Betti Cruz recently, who  initiated Cymbalta in place of her Effexor and initiated Topamax.  She was  started at 60 mg of Cymbalta and 100 mg of Topamax; however, she has not  initiated any of these medications.  She reports muscle weakness and  stiffness.  She notes that she is dropping objects.  She has redness and  swelling in both calves.  This seems to be worse when she is up and about  for some time.  She has had some problems with urinary urgency and  incontinence.  She rates her pain as a 9/10 today.  She went through her  usual litany of complaints and reasons for her problems, including  psychosocial issues.   MEDICATIONS:  1.  Ativan 1 mg b.i.d.  2.  Flexeril 5 mg q.h.s., which had been essentially 5-10 mg t.i.d.  3.  Os-Cal Plus D daily.  4.  Darvocet-N 100 1 q.8h. p.r.n.  5.  Phenergan 25 mg q.day.  6.  Zomig 5 mg q.day. p.r.n.  7.  Lidocaine patch 1-2 q.day. on 12 hours daily.  8.  Sanctura 20 mg daily.  9.  Canasa p.r.n.  10. Fentanyl patch 50 mcg q.72h.  11. Lasix 40 mg q.day.   REVIEW OF SYSTEMS:  Patient reports occasional palpitations, anxiety,  agitation.  She reports headaches with aura.  Occasional nausea and  vomiting.  No other pertinent positives are listed above.   PHYSICAL EXAMINATION:  VITAL SIGNS:  Blood pressure 112/58, pulse 85,  respiratory rate 16.  She is satting at 97% on room air.  GENERAL:  Patient walks with a slight limp, favoring the left side.  Affect  is bright and alert.  She is fairly well kept.  MUSCULOSKELETAL:  She has some tightness in the low back into the thoracic  spine on the left side.  Patient is tender to palpation over these taut  areas.  Neck and lumbar areas were fairly stable today.  NEUROLOGIC:  Motor exam is +4-5/5  throughout.  Sensory exam is intact.  Reflexes were 2+.  The patient remained anxious but cognitively appropriate.  SKIN:  Some minor breakdown on the legs in the shin and calf areas.  There  was 1+ edema of the calves.  HEART:  Regular rate and rhythm.  LUNGS:  Clear.  ABDOMEN:  Soft and nontender.   ASSESSMENT:  1.  Fibromyalgia with myofascial features.  2.  Bipolar disorder.  3.  Somatoform pain disorder.  4.  Migraine headaches with aura.  5.  Restless legs syndrome.  6.  Right shoulder pain, consistent with rotator cuff tendonitis.  7.  Right elbow pain, consistent with lateral epicondylitis.   PLAN:  1.  Since she is having increased spasms, would resume Flexeril only at 5 mg      2-3 times per day.  2.  Would use Zomig for break-through headaches.  3.  I have given the patient permission to increase her Darvocet temporarily      to one-half tablet q.8h. daily.  4.  The Lidoderm patches.  5.  Recommend slower titration of her Topamax.  I would begin at 25 mg and      titrate up every 4 days by 25  mg to reach 100 mg q.h.s.  6.  Would begin Cymbalta at 60 mg q.day.  It may be wise also to decrease      this to 30 mg q.day. concerning her history of intolerance.  7.  Multivitamin supplementation.  8.  Increased activity and exercise would do her good.  9.  I will see her back on her scheduled March appointment.  I see no reason      for trigger point injections today.      ZTS/MedQ  D:  11/08/2004 14:08:45  T:  11/08/2004 15:15:55  Job #:  [id number]

## 2011-02-17 NOTE — Discharge Summary (Signed)
[facility]  Patient:    Patty Salas, Patty Salas                       MRN: [id number] Adm. Date:  [id number] Disc. Date: [id number] Attending:  Denny Peon CC:         Carlye Grippe, M.D.   Discharge Summary  INTRODUCTION:  The patient is a 55 year old white married female who was readmitted because of increased confusion and depression.  Patient was discharged from [facility] on November 19th after being treated for substance abuse and depression.  She presented at partial hospital with confusion, disorganized state, rambling speech and disorganized thinking.  Patient felt increasingly depressed and despondent because of losses she suffered in her life.  She reported feeling hopeless and helpless. Initial impression was major depression with psychotic features; severe polysubstance abuse, by history.  GAF upon admission was 40.  Admitting physician was Dr. Constance Holster.  HOSPITAL COURSE:  After admitting to the ward, patient was placed on special observation.  Patient was restarted on Prozac 60 mg daily and Neurontin 400 mg three times a day, Zyprexa 5 mg twice a day and Zyprexa 10 mg at bedtime.  On August 26, 2000, she was a little bit better, bright and more coherent.  On August 27, 2000, she was still ambivalent about long-term treatment/rehabilitation for drugs and thoughts were still scattered and preoccupied with her loss, depression and problems of marriage.  Patient had a hard time staying on target.  I ordered Risperdal to help with racing thoughts.  Patient was interested in going to [facility].  On August 28, 2000, patient was showing some response to Risperdal with more positive thoughts, no psychosis, alert and oriented x 3, no dangerous ideations.  Patients father wanted to help her with transportation to [facility].  It was felt that she could benefit from such a treatment,  especially with recent hyperirritability just prior to admission; she was using drugs again.  MEDICAL PROBLEMS:  Patient did not have any medical problems during this brief hospitalization.  The only blood work done was a Forensic scientist, which was virtually normal.  VITAL SIGNS:  Vital signs showed blood pressure of 116/70; normal temperature, respiration rate and pulse.  DISCHARGE DIAGNOSES Axes I:    1. Major depressive disorder with psychotic features, severe.            2. Polysubstance abuse. Axis II:   Personality disorder, not otherwise specified. Axis III:  No diagnosis. Axis IV:   Severe stressors -- family problems and losses. Axis V:    Global assessment of functioning upon admission; upon discharge 55;            maximal for the past year 60.  DISCHARGE RECOMMENDATION:  Patient was discharged on the following medications:  Risperdal 0.25 mg one tablet three times a day; Zyprexa 5 mg one tablet twice a day and two tablets at bedtime; Neurontin 400 mg three times a day; Prozac 20 mg two tablets in the morning and one at noon.  Patient should tell the staff if there is recurrence of dangerous thoughts, hallucinations or side-effects from medications.  She will be discharge to [facility] with further followup established after her discharge from [facility].  Patient understood instructions and was discharged in good condition in the care of her father. DD:  10/02/00 TD:  10/03/00 Job: [id number]

## 2011-02-17 NOTE — Discharge Summary (Signed)
NAMEYANIN, MUHLESTEIN NO.:  [id number]   MEDICAL RECORD NO.:  [id number]          PATIENT TYPE:  INP   LOCATION:  3729                         FACILITY:  [facility]   PHYSICIAN:  Michelene Gardener, MD    DATE OF BIRTH:  06-06-1956   DATE OF ADMISSION:  01/01/2007  DATE OF DISCHARGE:  01/02/2007                               DISCHARGE SUMMARY   PRIMARY CARE PHYSICIAN:  Dr. Holley Bouche.   DISCHARGE DIAGNOSES:  1. Chest pain, secondary to musculoskeletal causes, plus or minus      anxiety.  2. Fibromyalgia.  3. Myofascial pain, which is chronic.  4. Chronic migraine.  5. Chronic back pain.  6. History of asthma.  7. Carpal tunnel syndrome.  8. Osteoarthritis.  9. Scoliosis.  10.Bipolar disorder.  11.Irritable bowel syndrome.  12.Insomnia.  13.Restless leg syndrome.   MEDICATIONS:  1. Flexeril 5 mg p.o. twice daily.  2. Lasix 40 mg p.o. once daily.  3. Proair as needed.  4. Zomig 10 mg p.o. once daily.  5. Indomethacin 25 mg p.o. 3 times daily.  6. Viscus lidocaine as needed.  7. Phenergan 25 mg as needed.  8. Sanctura 200 mg twice daily.  9. Ativan 1 mg 3 times daily as needed.  10.Darvocet N 100 q.8 hours p.r.n.  11.Duragesic patch 25 mg across chest wall q.72 hours.  12.Neurontin 100 mg p.o. 4 times daily.  13.Benadryl p.r.n.  14.Colace p.r.n.  15.Dulcolax p.r.n.  16.Lidoderm 5% patch 1-3 patches p.r.n.   CONSULTATIONS:  None.   PROCEDURES:  None.   FOLLOWUP:  With private physician in 1 week.   COURSE OF HOSPITALIZATION:  Patient is a 55 year old female, multiple  problems, presents with chest pain, which is most likely musculoskeletal  pain.  She was admitted to telemetry floor.  Three sets of troponins and  cardiac enzymes were done and they came to be normal.  An EKG was done  that showed no evidence of ischemia.  Her pain was reproducible pain.  She had some pain on and off, which was attributed to anxiety.  Current  pain is typical for  costochondritis with possible attribution from  anxiety.  After ruling out, patient was sent home.  She was advised to  follow with her primary physician for further assessment and possible  stress test if her pain persists.  Was advised to come back to the ER if  her pain increased or changed in nature or she developed shortness of  breath.   Assessment time:  Forty minutes.      Michelene Gardener, MD  Electronically Signed     NAE/MEDQ  D:  01/31/2007  T:  01/31/2007  Job:  [id number]   cc:   Holley Bouche, M.D.

## 2011-02-17 NOTE — Assessment & Plan Note (Signed)
MEDICAL RECORD NUMBER:  [id number].   Patty Salas is back regarding her fibromyalgia/chronic pain syndrome. She has  been doing fairly well since her last visit. She has had some recurrent  tender areas along the shoulder and upper back. The last of the trigger  point injections helped immensely. She has continued to work down off her  medications. She is off the Topamax. She is off the Topamax. She is using  Lidoderm patches as needed. She is on fentanyl patch 50 mcg q.72h. She likes  the Cymbalta at 60 mg q.d. dose. She is using Darvocet-N 100 one q.8h.  p.r.n. Ativan is 1 mg t.i.d. She saw Dr. Betti Cruz recently and asked for  limited medication changes. She would like to get off most of her sedating  medications. She still complains of some numbness along the hands. She has  had some migraine type symptoms. She has worn a soft collar off and on for  her neck. She is scheduled to see Dr. Danielle Dess tomorrow. Her CT films revealed  no acute fracture in the right lateral process of C3.   REVIEW OF SYSTEMS:  The patient reports occasional spasms, dizziness,  depression, anxiety, and numbness. She seems to be doing overall better  emotionally at this point compared to last March.   SOCIAL HISTORY:  The patient denies any changes. She remains married to her  husband. She had some family issues related to her father that seem to be  coming to some sort of closure.   PHYSICAL EXAMINATION:  Blood pressure 110/54, pulse of 90, respiratory rate  16. She is saturating 97% on room air. The patient is pleasant in no acute  distress. Affect is bright and appropriate. She is a bit calmer today than  usual. Posture was fair, but she still exudes the head forward, shoulder  rounded position. She is able to improve with cues. She has mild left  thoracolumbar levoscoliosis. Minimal tightness along the lumbar paraspinals  today, however. There was some prominence of the upper rhomboid musculature.  Left and right  trapezius were tight on the middle third. Left  sternocleidomastoid muscle was tender to palpation today. Shoulders were  curved as mentioned before, and both pectoralis muscles were tight.  Cognitively, the patient was fairly appropriate and lucent today. Cranial  nerve was intact. Motor exam was 5/5. Reflexes were 2+. Sensory exam  appeared inconsistent but in general intact.  HEART:  Regular rate and rhythm.  LUNGS:  Clear.  ABDOMEN:  Soft, nontender.   ASSESSMENT:  1.  Fibromyalgia with myofascial features.  2.  Bipolar disorder.  3.  Somatoform pain disorder.  4.  Migraine headaches.  5.  Restless leg syndrome.  6.  Right shoulder pain, consistent with rotator cuff tendinitis.   PLAN:  1.  After informed consent, we injected four trigger points along both      trapezius muscles, left sternocleidomastoid, and right rhomboid, each      with 2 cc of 1% lidocaine. The patient tolerated these well.  2.  I refilled fentanyl patches at 50 mcg q.72h. #10, Darvocet-N 100 one      q.8h. p.r.n. #90.  3.  Encouraged range of motion and postural exercises. I will also like to      see the patient increase aerobic activity. We discussed this at length      today.  4.  My goal will be to decrease her fentanyl to off over the next few      months'  time.  5.  Will consider physical therapy.  6.  Encouraged vitamin supplementation. The patient is taking a multivitamin      currently.  7.  Continue Cymbalta 60 mg q.d.  8.  I will see the patient back in three months' time.      ZTS/MedQ  D:  02/20/2005 13:46:26  T:  02/20/2005 15:06:34  Job #:  [id number]

## 2011-02-17 NOTE — Assessment & Plan Note (Signed)
HISTORY OF PRESENT ILLNESS:  Patty Salas is back regarding her chronic back pain  and fibromyalgia picture. She is weaned off a lot of her medications with  good results. She tried Lamictal but apparnetly had a skin reaction with  this and has been taken off of this. She states that she is trying to take  care of herself and trying to take care of family and others and feels that  she is more positive and having less emotional distress as a result. She has  had less falls since coming down off medications. She tries to stay active  with some walking and exercise. She still rates her pain at a 9 out of 10,  most predominantly in the neck into the back, occasionally the left more so  than the right leg. She has had no further falls. The pain is made worse  with bending, sitting and working. Generally improves with rest, heat, and  ice although the pain is sometimes constant. Had no new diagnostic workups  to my understanding.   CURRENT MEDICATIONS:  Include Darvocet N-100 1 q. 8 hours p.r.n., which she  is trying to decrease. Fentanyl patch 50 mcg q. 72 hours. Lidoderm patch 1  to 2 q.d. p.r.n. She also uses Flexeril 5 mg q. 8 hours p.r.n. She is on  Effexor XR 150 mg q.d., Ativan 1 mg b.i.d., Phenergan p.r.n., which she uses  sporadically and Allopurinol 300 mg q.d.   SOCIAL HISTORY:  She and her husband seem to be getting along better. No new  issues to my knowledge.   REVIEW OF SYSTEMS:  The patient reports some swelling in the extremities.  Occasional blurred vision, anxiety, numbness in the left side of the arm and  face as well as the leg, which is sporadic as well. She has had a negative  workup for this. Notes occasional dizziness and spasms in the low back.  Sleep is a problem as well as headaches at times. She has lost some weight  with diet change. She has some skin breakdown due to drug reaction.   PHYSICAL EXAMINATION:  VITAL SIGNS:  Blood pressure 140/65, pulse 115,  respiratory rate 18. She is sating 98% on room air.  NEUROLOGIC:  The patient has a fairly normal gait. Affect alert. She is  fairly well kept. She seemed to be a bit more together today and focused.  She has lost weight. She still has some positive impingement signs of the  right shoulder today. The upper back is a bit asymmetrical and there is some  tightness in the right rhomboids noticeable today and with palpation, she  had tenderness there along the  lower rhomboid on the right. Motor  examination was intact throughout at 4+ to 5 out of 5. Sensory examination  revealed no focal abnormalities. Reflexes were stable. Cognitively, the  patient appeared appropriate.  SKIN:  No obvious skin breakdown was noted. She had heavy makeup on the  face. Her skin was a bit dry throughout.  HEART:  Regular rate and rhythm.  LUNGS:  Clear.  ABDOMEN:  Soft, nontender.  EXTREMITIES:  No clubbing, cyanosis, or edema.   ASSESSMENT:  1.  Fibromyalgia with myofascial pain.  2.  Bipolar disorder.  3.  Questionable somatoform pain disorder.  4.  Migraine headache history.  5.  Restless leg syndrome.  6.  Right shoulder pain consistent with rotator cuff tendonitis.  7.  Right elbow pain consistent with lateral epicondylitis.   PLAN:  1.  I agree with the drug holiday that is taking place. We will further      decrease medications by getting rid of the morning and afternoon      Flexeril doses and only using 1/2 to 1 tablet at bedtime to assist with      sleep. She will use her Phenergan sparingly as well for nausea. She      stopped the Zomig. We will also try to decrease the Darvocet usage as      well to 1/2 to 1 tablet once or twice a day.  2.  I encouraged Lidocaine patches for tender points.  3.  Will discontinue the Allopurinol, as I do not believe that she has      really ever had gout. If she does have a flare of some sort, we      certainly can treat this as it arises.  4.  Continue with  Effexor and Lorazepam per psychiatry.  5.  Recommend multivitamin with magnesium and trace elements.  6.  Look into further other alternative fibromyalgia treatments. In the      meantime, I would like for her to work on her generalized activity,      aerobic exercise and flexibility training.  7.  Will see the patient back in about 2 months time.      ZTS/MedQ  D:  10/28/2004 11:27:45  T:  10/28/2004 14:39:31  Job #:  [id number]

## 2011-02-17 NOTE — Assessment & Plan Note (Signed)
Patty Salas is here in followup of her gross diffuse pain issues. In speaking  with Dr. Sandria Manly, he was having difficulty finding any organic reasons for her  pain and neurologic symptoms. The patient comes in with complaints today of  spasms, particularly in the right leg and quadriceps area. She also reports  some left sided facial numbness. Her pain is increased in that the level of  9 to 10/10. She notes pain in the shoulders, elbows, hips, thighs, knees,  shins, ankles, and feet as well as the heels, back, suboccipital and  cervical regions. The patient's function has been limited secondary to her  pain. Pain is worse with any type of activity except when she takes her  Darvocet which seems to alleviate the pain. She has had little relief with  the Ultram. She improves generally with rest and sometimes heat. As always,  the patient raced through multiple issues today including her excessive  fatigue, problems staying focused, decreased attention span, frequency of  urination, difficulty thinking, problems with coordination, and blurred  vision.   REVIEW OF SYSTEMS:  On other review of systems, the patient denied any chest  pain, shortness of breath, or cold, flu, coughing, or wheezing symptoms. She  does report anxiety, problems with sleep, and other pertinent positives are  listed above. Hearing and taste loss are noted as well headaches. She also  reports nausea, vomiting, diarrhea, and abdominal discomfort. Denies fevers,  chills, weight changes, skin breakdown, or excessive sweating.   PHYSICAL EXAMINATION:  On physical examination, blood pressure was stable.  She was afebrile. The patient was a bit more relaxed than on prior visits  but still raced from topic to topic. She had multiple areas of pain really  wherever I touched today. It was difficult to isolate any focal regions  where pain was worse. We examined both quadriceps areas in length today, and  no focal spasm was noted. The  patient did have very poor flexible throughout  the upper and lower extremity as well as the trunk and cord musculature. No  focal motor weakness was noted nor sensory loss. Reflexes were 2+. No  obvious swelling. The patient did have head forward posture with internal  rotation of his shoulders which has been characteristic for her. Cranial  nerve exam was within normal limits today, and cognition, other than her  excitability, was appropriate.   ASSESSMENT:  1. Fibromyalgia and myofascial pain.  2. Restless leg syndrome.  3. Bipolar disorder.  4. Multiple pain complaints which appeared to be partly somatoform in     origin.  5. Migraine headache.   PLAN:  1. We discussed the nature of her pain today and how I felt that it was     difficult to isolate a specific problem or reason for her pain.     Certainly, fibromyalgia may be a large component of it, but a lot of her     symptoms seem to be nonorganic in nature. I feel that it is key for her     to have aggressive and regular psychiatric followup. I hesitate to adjust     her psychiatric medications as she has an established [facility], Dr.     Betti Cruz.  2. For her pain, we will start her on low dose Duragesic at 25 mcg q.72h.     She may use Darvocet-N 100 for breakthrough symptoms.  3. She needs to begin a course of outpatient physical therapy to work on  flexibility, range of motion, and activity in general.  4. Did recommend tai chi as a means to increase her balance and flexibility.  5. Stop her Bellaspaz today.  6. I will see her back in about a month's time.      Ranelle Oyster, M.D.   ZTS/MedQ  D:  03/15/2004 16:20:27  T:  03/15/2004 17:10:26  Job #:  [id number]   cc:   Daine Floras, M.D.  [address]  [place]  [place] [address]  Fax: [phone]

## 2011-02-17 NOTE — Assessment & Plan Note (Signed)
Patty Salas is back regarding her chronic pain and fibromyalgia.  She states that  actually she has been feeling pretty well.  She has been liberating herself  from her husband and is a little more independent.  She has been active with  exercise in a few groups locally.  She did have a fall where she injured  her low back.  Pain has been more prominent there than other regions.  She  does not really have any frank radiation into the legs.  Pain usually stays  local in the mid low back and left and right flanks.  She likes the  Duragesic patch 25 mcg q.72 h.  She is using 2-3 whole Darvocets a day,  although she generally breaks them in half and uses them q.4-6 h.  She is on  Neurontin 100 mg b.i.d. to t.i.d. for migraine prophylaxis.  She is hoping  to speak with psychiatry about titrating down on the Ativan and increasing  Neurontin for mood stabilization.   The patient is on Zomig 12 tablet a month for breakthrough migraine  headaches through Dr. Tiburcio Pea.  Sleep is good.   REVIEW OF SYSTEMS:  Multiple items are noted on the health and history  section.  Most prominently, her appetite is increasing a bit.  She has some  irritable bowel symptoms still.  Still has lower extremity spasms and pain  in the feet related to her periodic gouty flares.  Recent low-grade  temperature.  Please see health and history section for further review  items.   SOCIAL HISTORY:  The patient has become more active with her church.  She is  currently separated from her husband but living with him.   PHYSICAL EXAMINATION:  Blood pressure is 129/67, pulse 105, respiratory rate  17.  She is satting 95% on room air.  The patient is pleasant, in no acute  distress.  She is alert and oriented x3.  She remains a bit anxious but she  generally is redirectable and follows commands.  Gait is stable with mild  antalgia noted on either side with weight through the ankles and feet.  Coordination is normal.  Deep tendon  reflexes are 2+.  Sensation is grossly  intact.  She had taut bands and muscles along the lower thoracic/upper  lumbar paraspinal muscles.  The area was more tender with flexion and  lateral bending today than extension and rotation.  Iliac crests were  minimally tender.  The patient had some generalized pain in the trapezius  and shoulder girdle musculature which appeared stable from last exam.  The  heart was slightly tachycardic.  The chest was clear.  Abdomen soft,  nontender.  Neurologically, the remainder of her exam was normal including  cranial nerves.   ASSESSMENT:  1. Fibromyalgia syndrome.  2. Bipolar disorder.  3. Somatoform pain disorder.  4. Chronic headaches.  5. Insomnia.  6. Carpal tunnel syndrome.  7. Left rotator cuff syndrome.  8. Right rotator cuff impingement.  9. Questionable osteoarthritis versus gout in the feet.   PLAN:  1. We injected 4 trigger points today along the lower thoracic and upper      lumbar paraspinals using 2 mL of 1% lidocaine in each injection.  The      patient tolerated this well.  2. Will send the patient for x-rays of the thoracic and lumbar spine to      rule out occult fracture.  3. Encourage psychiatry to increase Neurontin and decrease Ativan for  better pain control.  4. Encouraged patient to decrease frequency of Darvocet to 2-3 times      daily.  5. Refill  Duragesic 25 mcg q.72 h.  6. Will see her back in 3 months' time.  I will have the nurse clinic see      her back in 1 month.      Ranelle Oyster, M.D.  Electronically Signed     ZTS/MedQ  D:  06/26/2006 13:33:12  T:  06/28/2006 11:05:38  Job #:  [id number]   cc:   Holley Bouche, M.D.  Fax: [phone]

## 2011-02-17 NOTE — Procedures (Signed)
[name], [name] NO.:  [id number]   MEDICAL RECORD NO.:  [id number]          PATIENT TYPE:  REC   LOCATION:  [facility]                          FACILITY:  [facility]   PHYSICIAN:  Ranelle Oyster, M.D.DATE OF BIRTH:  [birth year]   DATE OF PROCEDURE:  06/27/2004  DATE OF DISCHARGE:                                 OPERATIVE REPORT   MEDICAL RECORD NUMBER:  [id number].   PROCEDURE:  Trigger point injections, diagnosis code 723.9.   Patty Salas is here in follow up of her neck and back pain. She has had some  continuing pain in the left scapular region. She did well with the trigger  point injections into the trapezii and left latissimus dorsi muscles. Now  she is having pain more laterally towards the shoulder. We palpated 2  trigger points, 1 along the teres major and another along the lateral  portion of the latissimus dorsi. After informed consent, we injected each  with 2 cc of 1% lidocaine. The patient tolerated this well and had good  results. Post injection instructions were given.   I will see the patient back at her next scheduled visit. She was found to  have some left wrist weakness the other day and is being worked up by  neurology. I found wrist as well as hand intrinsic weakness today. I would  like to send her for scan and MRI of the neck as well as the left shoulder,  rule out radiculopathy or brachial plexopathy.       ZTS/MEDQ  D:  06/27/2004 12:54:58  T:  06/28/2004 11:30:27  Job:  [id number]

## 2011-02-17 NOTE — Op Note (Signed)
NAME:  Patty Salas, Patty Salas                          ACCOUNT NO.:  [id number]   MEDICAL RECORD NO.:  [id number]                   PATIENT TYPE:  AMB   LOCATION:  [facility]                                 FACILITY:  [facility]   PHYSICIAN:  Ronald L. Ovidio Hanger, M.D.           DATE OF BIRTH:  30-Jan-1956   DATE OF PROCEDURE:  07/01/2002  DATE OF DISCHARGE:                                 OPERATIVE REPORT   DIAGNOSIS:  Right lower ureterolithiasis.   OPERATIVE PROCEDURE:  1. Cystourethroscopy.  2. Removal of right double-J stent.  3. Right ureteroscopy with holmium laser lithotripsy and fragment     extraction.  4. Placement of new double-J stent.   SURGEON:  Lucrezia Starch. Earlene Plater, M.D.   ANESTHESIA:  General laryngeal airway.   BLOOD LOSS:  Negligible.   TUBES:  26 cm 6 French Bard double-pigtail stent.   COMPLICATIONS:  None.   INDICATIONS FOR PROCEDURE:  [name] is a very nice 55 year old white  female, who presented with a right distal ureterolithiasis.  She was to have  lithotripsy last Thursday and had fever.  Therefore, cystoscopy and stent  was placed at that time.  She has a very large distal stone that appeared to  be impacted, and after understanding risks, benefits, and alternatives, has  elected to proceed with the current procedure.   PROCEDURE IN DETAIL:  The patient was placed in the supine position.  After  proper general laryngeal airway anesthesia, was placed in the dorsal  lithotomy position and prepped and draped with Betadine in a sterile  fashion.  Cystourethroscopy was performed with a 22.5 [place] Olympus  panendoscope.  The stent was grasped and pulled to the meatus and under  fluoroscopic guidance, a .038 French Teflon coated guidewire was placed into  the right renal pelvis and the old stent was removed.  Ureteroscopy was then  performed with the Olympus short thin ureteroscope, and the stone was  visualized.  It was quite large and utilizing the 365 laser fiber on  a  repetition rate of 5 and a setting of .5, the stone was fragmented into  multiple small fragments.  The fragments were serially grasped with a  nitinol basket and extracted in total, and reinspection of the lower two-  thirds ureter revealed no significant fragments remaining.  The ureteroscope  was visually removed.  The bladder was drained.  Fragments were collected  and under fluoroscopic guidance, a 26 cm 6 French Bard double-pigtail stent  was placed and noted to be in good position within the right renal pelvis  and within the bladder.  The bladder was drained; the panendoscope was  removed, and the patient was taken to the recovery room stable.  Ronald L. Ovidio Hanger, M.D.   RLD/MEDQ  D:  07/01/2002  T:  07/01/2002  Job:  [id number]
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
## 2011-02-17 NOTE — Op Note (Signed)
[facility]  Patient:    Patty Salas, Patty Salas Visit Number: [id number] MRN: [id number]          Service Type: DSU Location: 3000 [id number] Attending Physician:  Donn Pierini Dictated by:   Julio Sicks, M.D. Proc. 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  PROCEDURE:  C5-6 and C6-7 anterior cervical diskectomy and fusion, allograft anterior plate instrumentation.  SURGEON:  Julio Sicks, M.D.  ASSISTANT:  Bradd Canary., M.D.  ANESTHESIA:  General endotracheal.  INDICATIONS:  [name] is a 55 year old female with history of chronic neck and left upper extremity pain, failing all conservative management.  MRI scanning demonstrates evidence of advanced spondylosis at C5-6 and C6-7 with spinal stenosis and associated foraminal stenosis.  The patient has been counseled as to her options.  She has decided to proceed with the C5-6 and C6-7 anterior cervical diskectomy and fusion.  Allograft anterior plate instrumentation, for hopeful improvement in her symptoms.  DESCRIPTION OF PROCEDURE:  The patient was taken to the operating room and placed on the operating table in the supine position.  After an adequate level of anesthesia was achieved, the patient was placed supine with the neck slightly extended and held in place with halter traction.  The anterior cervical region was shaved and prepped sterilely.  The 10 blade was used to make a linear skin incision, extending from just right of midline to the medial border of the sternocleidomastoid muscle on the right.  This carried down sharply to the platysma.  The platysma was then divided vertically, and dissection proceeded well in the medial border of the  sternocleidomastoid muscle and carotid sheath.  Trachea and esophagus were mobilized and retracted towards the left.  Prevertebral fascia was stripped off the anterior spinal column.  The longus colli muscles were then elevated using electrocautery. Deep self-retaining retractor was placed.  Intraoperative fluoroscopy was used, and the C5-6 and C6-7 levels were confirmed.  The disk space was then incised with the 15 blade in rectangular fashion.  Wide disk space clean-out was then achieved using pituitary rongeurs, Epstein curette, Kerrison rongeurs, and a high speed drill.  All elements of the disk were removed off of the posterior annulus.  Using the microscope, starting first in the C5-6, the remaining aspects of the annulus with osteophytes were removed down to the level of the posterior longitudinal ligament.  The posterior longitudinal ligament was then elevated and retracted pieces of fascia using Kerrison rongeurs.  Underlying thecal sac was identified.  A wide central central decompression then performed.  Decompression extended out into the left side of C6 foramen.  The C6 nerve root was identified proximally and followed out distally.  This was widely decompressed using Kerrison rongeurs.  At this point, a blunt probe passed easily along the course of the nerve root.  The right-sided C6 nerve root was similarly decompressed, again without incident. The procedure was then repeated at C6-7, again without complication.  At this point, very wide decompressions had been achieved at C5-6 and C6-7.  There was no evidence of injury to the thecal sac or nerve roots.  The wound was then  copiously irrigated with antibiotic solution.  Gelfoam was placed postoperatively for hemostasis which was found to be good.  Disk space was then distracted C5-6, and a 6 mm fibular allograft was impacted in place stretched approximately 1 mm from the anterior to the vertical surface.  Disk space was then  distracted at C6-7, and a 7 mm fibular allograft was impacted into place and stretched approximately 1 mm to the anterior cortical surface. The microscope was removed. Intraoperative x-ray revealed good position of bone grafts at the L5 level at normal end the spine.  A 37 mm Atlantic anterior cervical plate was then placed over the C5, C6, and C7 levels.  This was then contoured appropriately. It was then attached with two 13 mm fixed angled screws at 13 mm fixed angled screws at C5, C6, and C7.  All screws were placed under fluoroscopic guidance.  All screws were found to be solidly within bone.  They were given a final tightening.  Locking screws were engaged in all three levels.  The wound was then irrigated one final time. Retraction system was removed.  Final images revealed good position of the bone grafts and hardware throughout the operative level with normal end of spine. Hemostasis was assured with the bipolar electrocautery.  The wound was then closed in layers with Vicryl sutures.  Steri-Strips and sterile dressing were applied.  There were no apparent complications.  The patient tolerated the procedure well, and she returns to the recovery room postoperatively. Dictated by:   Julio Sicks, M.D. Attending Physician:  Donn Pierini DD:  06/04/01 TD:  06/04/01 Job: [id number]

## 2011-02-17 NOTE — Discharge Summary (Signed)
Patty Salas, Patty Salas NO.:  [id number]   MEDICAL RECORD NO.:  [id number]          PATIENT TYPE:  INP   LOCATION:  1506                         FACILITY:  [facility]   PHYSICIAN:  Marcelyn Bruins, M.D. [facility] DATE OF BIRTH:  [birth year]   DATE OF ADMISSION:  01/18/2006  DATE OF DISCHARGE:  01/22/2006                           DISCHARGE SUMMARY - REFERRING   DISCHARGE DIAGNOSES:  1.  Status post acute respiratory failure/ventilator dependent respiratory      failure secondary to tricyclic antidepressant and benzodiazepine      overdose.  2.  Mood disorder (not otherwise specified) with possible bipolar and      depression component.  3.  Dilutional anemia.   LABORATORY DATA:  On January 22, 2006, white blood cells 8.3, hemoglobin 12.1,  hematocrit 36.3, platelets 404.  This is in comparison to labs on January 20, 2006, white blood cells 9.6, hemoglobin 10.1, hematocrit 32.3, platelets  327.  On January 20, 2006, sodium 139, potassium 3.9, chloride 105, CO2 30,  glucose 98, BUN 8, creatinine 0.6.  January 18, 2006, urinary culture  negative.   RADIOLOGY:  Bibasilar atelectasis, no acute infiltrates or edema.  Triple  lumen is now removed.   BRIEF HISTORY:  A 55 year old female brought to the emergency department by  paramedics after being found unresponsive.  She apparently had called 911  prior to being found and taking an unknown number of unspecified  medications.  Upon initial evaluation by the critical care team, she was  intubated by the EDP, sedated and paralyzed.  She was followed by outpatient  psychiatry, Dr. Faith Rogue.  Her urine drug screen was positive for  barbiturates, benzodiazepines, and tricyclics.  Her alcohol level was less  than 5.   HOSPITAL COURSE BY DISCHARGE DIAGNOSIS:  PROBLEM #1 -  ACUTE RESPIRATORY  FAILURE/VENTILATOR DEPENDENT RESPIRATORY FAILURE SECONDARY TO POLYSUBSTANCE  OVERDOSE:  Patty Salas was admitted to the medical surgical intensive  care  unit at [facility]  She was supported on mechanical ventilation  until her level of consciousness had improved.  She was  successfully  extubated from mechanical ventilation on hospital day #1, January 19, 2006. No  pulmonary sequelae were noted and no active pulmonary disease following  extubation.  She is now on room air oxygen and, from a pulmonary standpoint,  cleared for inpatient psychiatry.   PROBLEM #2 -  STATUS POST POLYSUBSTANCE OVERDOSE WITH BENZODIAZEPINES AND  TRICYCLIC ANTIDEPRESSANTS:  She was evaluated by Dr. Jeanie Sewer and suggested  that she needs inpatient evaluation and care for her depression/mood  disorder.  Care in the inpatient setting has consisted only of discontinuing  medications and sitter at bedside for safety.   PROBLEM #3 -  DILUTIONAL ANEMIA:  Hemoglobin upon presentation noted to be  at 13.4.  She nadired at 10.8 after volume resuscitation in the immediate  hospital admission period.  She is now close to baseline at 12.1 without  transfusion.  This is considered a dilutional anemia and no active bleeding  noted.  No further follow-up necessary.   ALLERGIES:  It should  be noted that Patty Salas has an extensive allergy list  as follows:  1.  DOXYCYCLINE.  2.  NONSTEROIDAL ANTI-INFLAMMATORY DRUGS.  3.  DIFLUCAN.  4.  PENICILLIN.  5.  ERYTHROMYCIN.  6.  CONTRAST MEDIA.  7.  HYDROXYZINE HYDROCHLORIDE.  8.  SALICYLATES.  9.  KETOROLAC.  10. LATEX (Additional unspecified allergies as well).   DISCHARGE MEDICATIONS:  Vivelle-Dot 0.1 mg a day patch.   DISCHARGE PHYSICAL EXAMINATION:  VITAL SIGNS:  Stable.  Saturations 96% on  room air.  LUNGS:  Breath sounds within normal limits.  CARDIOVASCULAR:  Heart tones within normal limits.   Currently medically cleared for discharge for inpatient psychiatry  evaluation and therapy.      Anders Simmonds, N.P. [facility]    ______________________________  Marcelyn Bruins, M.D. [facility]    PB/MEDQ  D:   01/22/2006  T:  01/22/2006  Job:  [id number]

## 2011-02-17 NOTE — Assessment & Plan Note (Signed)
MEDICAL RECORD NUMBER:  [id number].   Patty Salas is back regarding her fibromyalgia and pain syndrome. She has  complained of some increased left shoulder and neck pain since last visit. I  last saw her August 30. Her pain is a 9/10 today. She is still having some  difficulties with falls at home which seem to be more in the evening hours.  She has a workup under way for this. She has had a change in her medications  from a pain standpoint. She remains on fentanyl 50 mcg q.72h. and Darvocet-N  100 q.8h. p.r.n. She uses her Lidoderm patches for local relief with better  results. She also uses Flexeril for sleep and muscle spasm. She is on  Cymbalta for mood. She has been given prednisone by her family physician and  ER doctors for acute pain flare ups. She also has Indocin available for  gout.   The patient states pain is sharp, stabbing, constant. Pain today is mostly  in the left shoulder and neck but also in the left flank. Pain interferes  with general activity, relations with others, enjoyment of life on a severe  level. Sleep is fair. Pain is worse with walking, bending, inactivity, and  standing.   REVIEW OF SYSTEMS:  On review of systems, the patient reports weakness,  bladder control problems, numbness, tremor, spasms, dizziness, depression,  anxiety, nausea, vomiting, diarrhea, constipation, and weight gain. Other  pertinent positives are listed in the health and history section.   SOCIAL HISTORY:  The patient lives with her husband. Her parents are  supportive.   PHYSICAL EXAMINATION:  VITAL SIGNS:  Blood pressure is 128/62, pulse is 98,  respiratory rate 16. She is saturating 98% on room air.  GENERAL:  The patient is generally anxious but pleasant and in no acute  distress. She is fixated on her multiple somatic complaints. She brings  abundance of material once again to the office.   Reflexes were 2+. Motor function is 5/5. Sensory exam is inconsistent,  possibly some  sensory loss in the distal extremities, but sensation seems to  be functional. Cognition is generally intact. On examination of the neck,  there was taut bands in the lower cervical and mid trapezius area  bilaterally. Left mid trapezius was tender as well on the nipple line. Left  shoulder is painful to palpation both anteriorly and posteriorly.  Impingement sign was positive. Bicipital tendon signs were equivocal. There  was an area of taut muscle of his lumborum on the left side. Back range of  motion was fair. Neck range of motion was fair to average.   HEART:  Regular rate and rhythm.  LUNGS:  Clear.  ABDOMEN:  Soft and nontender.  SKIN:  Remains dry. She has no focal acute inflammatory changes of the hands  or feet. She has pain along the metatarsal head but no breakdown was seen.   ASSESSMENT:  1.  Fibromyalgia with myofascial features.  2.  Bipolar disorder.  3.  Somatic pain disorder.  4.  Migraine headaches.  5.  Restless leg syndrome.  6.  History of right rotator cuff tendinitis now with left sided symptoms.  7.  Carpal tunnel syndrome.  8.  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   PLAN:  1.  After informed consent, we injected four trigger points today including      the left trapezius, bilateral cervical areas at C4, and left quadratus      lumborum. The patient tolerated these without issue.  2.  We injected the left shoulder today via the posterior approach using 3      cc of 1% lidocaine and 40 mg of Kenalog. The patient tolerated this      without incident. She had relief in the shoulder today before she left      the office.  3.  Continue fentanyl and Darvocet as noted above.  4.  Needs to continue with exercise and diet programs.  5.  May benefit from O24 fibromyalgia ointment to the neck and tender      points.  6.  Recommend psychological counseling as able.  7.  I will see her back in one month's time.      Ranelle Oyster, M.D.  Electronically  Signed     ZTS/MedQ  D:  06/30/2005 10:02:17  T:  06/30/2005 10:49:04  Job #:  [id number]
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
## 2011-02-17 NOTE — Assessment & Plan Note (Signed)
MEDICAL RECORD NUMBER:  [id number]   DATE OF VISIT:  April 06, 2006   REASON FOR VISIT:  Patty Salas is back regarding her chronic pain.  Patty Salas has  been doing better emotionally since I last saw her.  She has been following  up with Dr. Betti Cruz for psychiatric issues.  She has been placed on Elavil  which she is currently taking at 12.5 mg nightly.  She is also on Ativan 1  mg b.i.d.  She states that these were dosed higher, but she decreased the  Ativan to her tolerance as well as the Elavil.  She seems to be sleeping  fairly well with this combination.  She reports pain at a 8-9/10 and reports  more pain in the left shoulder into the arm and fingers.  The pain is sharp,  stabbing, constant tingling and aching.  She seems to trace it back to where  she carried a heavy bag of groceries.  The pain increases with walking,  bending, sitting and any activity.  She continues on Darvocet for  breakthrough pain.  She tried Lyrica and was unable to tolerate it secondary  to muscle rigidity and confusion.  She uses Lidoderm patches for her tender  points as well as Phenergan for breakthrough migraine control.  She also  uses limited doses of Bellaspas for migraine control.  Zomig is also used  for breakthrough headaches.  Things seem to be going a little bit better  with her husband, although they have their moments which tend to stress her  out further.   REVIEW OF SYSTEMS:  The patient reports multiple items which are recorded in  the written health and history section.  Please see for details.   SOCIAL HISTORY:  The patient remains married, but her relationship is rocky.   PHYSICAL EXAMINATION:  VITAL SIGNS:  Blood pressure 139/54, pulse 98,  respirations 16, saturations 99% on room air.  GENERAL:  The patient was pleasant in no acute distress.  She is a bit  anxious as usual and tangential in thought process.  Gait is slightly wide-  based, but stable.  Multiple trigger points were palpated along  the upper  and mid Trapezius as well as the sternocleidomastoid muscles.  Also noted,  lower quadratus lumborum was tight today.  She seemed to be within normal  limits for her cognition.  HEART:  Regular rate and rhythm.  LUNGS:  Clear.  ABDOMEN:  Soft, nontender.  EXTREMITIES:  Left shoulder was notable for positive impingement signs  today.   ASSESSMENT:  1.  Fibromyalgia syndrome.  2.  Bipolar disorder.  3.  Somatoform pain disorder.  4.  Chronic headaches.  5.  Restless leg syndrome.  6.  Carpal tunnel syndrome.  7.  Left rotator cuff syndrome.  8.  The patient also with history of right rotator cuff symptoms.  9.  Osteoarthritis.   PLAN:  1.  After informed consent, we injected the left shoulder with 3 mL of 1%      lidocaine and 40 mg of Kenalog.  The patient tolerated this well.      Sterile technique was used.  We injected multiple trigger points along      the right Trapezius, sternocleidomastoid today in addition to the left      quadratus lumborum.  We injected four areas in total.  2.  Will continue with Darvocet for breakthrough pain with goal of      decreasing further.  3.  I encouraged  the use of Elavil at bedtime to help with sleep and mood      control.  4.  Encourage exercise and stretching activities.  5.  Continue vigorous psychiatric followup.  6.  I will see the patient back in approximately 3 months' time.  The      patient will follow up with the [facility] in 1 month.      Ranelle Oyster, M.D.  Electronically Signed     ZTS/MedQ  D:  04/06/2006 13:32:32  T:  04/06/2006 14:14:58  Job #:  [id number]   cc:   Gregary Signs A. Everardo All, M.D. [facility]  [address]  [place]  [place] [address]   Daine Floras, M.D.  Fax: [phone]

## 2011-02-17 NOTE — Assessment & Plan Note (Signed)
FOLLOW UP VISIT   SUBJECTIVE:  Patty Salas was recently in the hospital for a day or two  earlier last week with noncardiac chest pain.  She was discharged home.  She states that she is feeling better now.  There was some question of  whether she had some mild costochondritis.  Patient continues to have  generalized pain in the neck and back as well as multiple other areas.  She remains anxious with other psychosocial stressors as well.  Patient  reports migraine headaches with associated left arm numbness.  Sometimes  she has left arm numbness independently.  She has been diagnosed with a  potential left ulnar nerve injury by [facility].  Things seem to be settling down a bit at home but still she has a lot of  stressors in her life related to her husband.  She sees Dr. Betti Cruz for  psychiatric management and has not had any recent changes in her  medications.  Patient rates her pain at 6-7/10.  Pain interferes with  general activity, relations with others, enjoyment of life on a severe  level.  The pain is described as tingling, aching, intermittent,  stabbing, sharp at times.   REVIEW OF SYSTEMS:  Patient reports bladder control problems, occasional  spasms, dizziness, tingling, numbness, nausea, vomiting.  She has had  some intermittent shortness of breath.  She denies any suicidal  thoughts.  Full review is in the health and history section and other  pertinent positives are above.   SOCIAL HISTORY:  Patient is living with her husband who has his own  issues.   PHYSICAL EXAMINATION:  VITAL SIGNS:  Blood pressure 120/60, pulse 90,  respirations 16.  GENERAL:  Patient is generally pleasant, a bit anxiety as usual but no  acute distress.  She has multiple tender points throughout the  shoulders, neck, arms, trunk, low back, pelvis, legs, etc.  Her weight  is generally stable.  Posture is fair.  She walks with a slightly wide-  based gait.  EXTREMITIES:  No  vascular abnormalities noted in the extremities.  She  continues to have valgus deformities at the first toes on either side.  NEUROLOGICAL:  Motor function is 5/5.  Sensory exam is generally intact.  Cognitively patient shows fair awareness, although remains anxious and  is sometimes unable to focus due to anxiety and need to tell the whole  story.  CHEST:  Is clear.  ABDOMEN:  Soft, nontender.   ASSESSMENT:  1. Fibromyalgia syndrome.  2. Chronic headaches/migraines.  3. Bipolar disorder.  4. Carpal tunnel syndrome.  5. Left rotator cuff syndrome.  6. Right rotator cuff impingement.  7. 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  8. Myofascial pain.  9. Insomnia.  10.History of noncardiac chest pain.   PLAN:  1. I injected again multiple trigger points as she does well with      these in the bilateral trapezius and the cervical paraspinals as      well as the lumbar paraspinals at L4, L5.  Patient tolerated well.  2. Continue Darvocet for breakthrough pain.  Encouraged alternative      measures as far as exercise, stretching, stress management, etc.,      for pain relief.  We would like to wean her off these medications      over the next several months, although she continues to have      problems pop up that are interfering with this process.  3. See  psychiatric, follow up with Betti Cruz.  4. Neurological follow up with Dr. Sandria Manly.  I am really hesitant to      associate her left arm symptoms with her migraine as a lot of her      pain is related to her fibromyalgia and her anxiety in psychiatric      diagnosis is general in my opinion.  5. I will see the patient back in 3 months.  She will see the nurse      clinic in one month.      Ranelle Oyster, M.D.  Electronically Signed     ZTS/MedQ  D:  01/08/2007 10:21:17  T:  01/08/2007 11:12:28  Job #:  [id number]   cc:   Daine Floras, M.D.  Fax: [phone]   Holley Bouche, M.D.  Fax: [phone]    Genene Churn. Love, M.D.  Fax: [phone]

## 2011-02-17 NOTE — Assessment & Plan Note (Signed)
Patty Salas is back regarding her fibromyalgia.  She has been steady for the most  part.  She has some flaring, painful areas in the right back primarily,  although there are a few signs in the left as well.  We continue to work  down her narcotic medications.  She is on 50/325 mg Darvocet one q.6h.  p.r.n.  She is usually taking two to three a day of these.  Her fentanyl  patch moves to 25 mcg this week.  She is on regular Cymbalta at 60 mg daily.  Her Vivelle was decreased to 0.75 mg.  She tries to remain active with her  stretching and range of motion.   The patient describes her pain as sharp, burning, stabbing, constant,  tingling.  It interferes with general activity, relationships with others  and enjoyment of life on a moderate to severe level.  Sleep is fair.  Pain  increases usually with most activities.  She finds that she does better when  she tries to mentally relax and uses heat and ice as well as medications.  The trigger point injections also help a good deal.  She was placed on  p.r.n. Ativan by Dr. Betti Cruz to help control her mood.  The dose is 1 mg two  to three times a day as needed.   REVIEW OF SYSTEMS:  Pertinent positives listed above.  Full review is in the  written health and history section.   SOCIAL HISTORY:  Without change.  The patient continues to try to work  through her marriage.   PHYSICAL EXAMINATION:  VITAL SIGNS:  Blood pressure is 108/65, pulse 100,  respiratory rate 16.  The patient is saturating 98% on room air.  GENERAL:  She is generally pleasant, in no acute distress.  She is alert and  oriented x3.  She appears a bit clearer and her balance is good overall.  Motor function is generally 5/5.  She is wearing a left elbow band as well  as a wrist splint today.  SKIN:  Skin is intact.  It remains dry and flaky throughout.  MUSCULOSKELETAL/NEUROLOGIC:  She had fair range of motion in the neck and  lower back today.  She had multiple tender points and  some generalized  flexion in her posture toward the right involving the shoulder and mid- to  low back areas.  I found tender points in both trapezius muscles as well as  the latissimus dorsi on the right.  There is also pain in both quadratus  lumborum areas, right greater than left.  CARDIAC:  Regular rhythm.  LUNGS:  Clear.  ABDOMEN:  Soft, nontender.   ASSESSMENT:  1.  Fibromyalgia with myofascial features.  2.  Bipolar disorder.  3.  Somatoform pain disorder.  4.  Migraine headaches.  5.  Restless legs syndrome.  6.  History of left rotator cuff tendinitis.  7.  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   PLAN:  1.  I injected six trigger points today involving bilateral trapezius, right      latissimus dorsi and bilateral quadratus lumborum muscles.  The patient      tolerated these well.  She will continue to work on stretching and other      modalities at home.  2.  Continue to decrease Darvocet.  We used half of a 50 mg tablet q.6h.      p.r.n.  3.  Decreased the fentanyl patch to 25 mcg.  4.  Continue  psychiatric follow-up with Dr. Betti Cruz.  She seems to be in a      fair place with her mood at this point,      although she still displays a lot of perseverative and manic-type      behaviors.  5.  I will see the patient back in three months' time.  She will follow up      with the [facility] clinic in one month.      Ranelle Oyster, M.D.  Electronically Signed     ZTS/MedQ  D:  10/17/2005 15:12:30  T:  10/18/2005 06:20:33  Job #:  [id number]   cc:   Gregary Signs A. Everardo All, M.D. [facility]  [address]  [place]  [place] [address]
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
## 2011-02-17 NOTE — Assessment & Plan Note (Signed)
HISTORY OF PRESENT ILLNESS:  Patty Salas is back regarding her chronic pain  syndrome and fibromyalgia. She continues to have multiple complaints. Since  we last met, she states that she passed a kidney stone. She fell off the  back of her husband's truck as well as had another fall when sitting up. She  has had no new positive diagnostic workup related to the falls. She had a  sleep study performed on August 04, 2004, which revealed no evidence of  sleep disorder. She had a nerve conduction EMG, which revealed a mild left  radial neuropathy. Cervical spine films were unchanged with a mild left C5  process. The patient continues to rate her pain as a 7 out of 10 on average.  It improves with rest, heat, ice therapy, medications and generally is worse  with walking, bending, sitting and working. It seems to affect many of her  social activities and quality of life. The patient reports right shoulder  and elbow pain once again. It has been some time since she has had a right  shoulder injection.   SOCIAL HISTORY:  She is living with her husband and they seem to be doing  fairly well. She helps take care of the home.   REVIEW OF SYSTEMS:  The patient reports swelling, cold, coughing symptoms,  weakness, numbness, dizziness, spasms, problems with anxiety, sleep,  depression, and has tension headaches. She reports nausea, vomiting,  diarrhea and constipation. The bladder has been better since the initiation  of the medicine, Sanctura 20 mg b.i.d. Reports no changes in swelling or  skin breakdown. She has lost weight over the last several months.   PHYSICAL EXAMINATION:  VITAL SIGNS:  Blood pressure is 124/76, pulse is 84,  respiratory rate 16. She is saturating 100% on room air.  NEUROLOGIC:  The patient walks with a normal gait. Affect is bright and  excitable. She is a bit more subdued today in comparison to most visits.  Clothing was fairly well kept. It appears that she has lost some  weight.  EXTREMITIES:  Right shoulder was positive for impingement signs today.  Lateral upper condyles tender to pain as well and had positive provocative  signs. The patient had some tender points in the right trapezius region as  well as the bilateral lumbar paraspinals, plus/minus quadratus lumborum  muscles. Motor examination was generally intact except for pain inhibition,  generally at the right shoulder. Left hand appeared to improve. Lower  extremity strength is stable at 4 to 5 out of 5. No focal sensory or reflex  changes were noted in the extremities. Cognition appeared to be generally  appropriate.  HEART:  Regular rate and rhythm.  LUNGS:  Clear.  ABDOMEN:  Soft and non-tender.   ASSESSMENT:  1.  Fibromyalgia with myofascial pain.  2.  Bipolar disorder.  3.  Questionable somatoform pain disorder.  4.  Migraine headache history.  5.  Restless leg syndrome.  6.  Right shoulder pain consistent with rotator cuff tendonitis.  7.  Right elbow pain consistent with lateral epicondylitis.   PLAN:  1.  The patient will continue with Lamictal per Dr. Imagene Gurney recommendations.      The patient has not seen effect as of yet. She is currently on 125 mg a      day.  2.  I refilled Duragesic patch at 50 mcg q. 72 hours. Refilled Darvocet N-      100 1 q. 8 hours p.r.n. #90.  3.  After informed consent, we injected the right shoulder today with 40 mg      of Kenalog and 2 cc of 1% Lidocaine. The patient tolerated this well. We      also injected the right elbow today at the lateral epicondyle, using 1.5      cc of Lidocaine and 1 cc of Kenalog. The patient tolerated these rather      well.  4.  We injected 3 trigger points today in the quadratus lumborum area as      well as the right trapezius. The patient suffered no consequences.  5.  Consider Lyrical trial next year. I would like to see the dust settle      from the standpoint of her current neurological medical workup.  6.  I  encouraged exercise and treatments as tolerated. We may re-visit      outpatient physical therapy in the new year.  7.  See the patient back in about 2 months time.      Zach   ZTS/MedQ  D:  08/29/2004 11:27:28  T:  08/29/2004 12:45:20  Job #:  [id number]   cc:   Genene Churn. Love, M.D.  [address]  [address]  [place]  [place] [address]  Fax: [phone]   Daine Floras, M.D.  [address]  [place]  [place] [address]  Fax: [phone]

## 2011-02-17 NOTE — Assessment & Plan Note (Signed)
MEDICAL RECORD NUMBER:  [id number].   Patty Salas is back regarding her multiple pain complaints and fibromyalgia. She  continues to have some pain in the neck and shoulder areas as well as the  low back. She is having a few weeks of relief with trigger point injections.  She has not been regular with her exercises. She say Dr. Danielle Dess regarding  her neck problems, and he felt she was not a surgical candidate. The patient  states her pain is at a 9/10; describes it as sharp, stabbing and constant.  The patient interferes with general activities, relationships with other and  enjoyment of life on a severe level. The pain is worse with bending, sitting  and standing. She has some problems occasionally with balance. She can walk  about 30 minutes at a time without stopping.   REVIEW OF SYSTEMS:  On review of systems, the patient is on Sanctura for  bladder spasms. He reports occasional weakness, spasms, trouble walking,  dizziness, depression, anxiety, nausea, vomiting, constipation, and  occasional urine retention.   SOCIAL HISTORY:  The patient is married and without any changes today.   PHYSICAL EXAMINATION:  VITAL SIGNS:  Blood pressure is 123/56, pulse is 95,  respiratory rate 16. She is saturating 98% on room air.  GENERAL:  The patient is feeling pleasant. Weight is stable. Affect is near  normal for her. Gait is stable. Reflexes are 2+. Motor function is 5/5.  Sensory exam is intact.  HEART:  Regular rate and rhythm.  LUNGS:  Clear.  ABDOMEN:  Soft, nontender.  SKIN:  Appeared to be less scaly today.   On examination of her neck, there were multiple points and palpable bands  noted in the bilateral splenius capitus muscles, left sternocleidomastoid as  well as the right trapezius, right rhomboid, and left quadratus lumborum  muscles. The right shoulder was more protracted and internally rotated than  the left today. Both shoulders, however, were significantly internally  rotated. The  patient had had forward posture as well.   ASSESSMENT:  1.  Fibromyalgia with myofascial features.  2.  Bipolar disorder.  3.  Somatoform pain disorder.  4.  Migraine headaches.  5.  Restless leg.  6.  History of right shoulder rotator cuff tendinitis.   PLAN:  1.  After informed consent, we injected six trigger points including      bilateral splenius capitus, right trapezius, left sternocleidomastoid,      right rhomboid, and left quadratus lumborum muscles. The patient      tolerated injections well and began to have some relief before she left      today.  2.  Continue fentanyl patch at 50 mcg q.72h. She is due for her next refill      after today's refill on June 19, 2005.  3.  Continue to wean Darvocet-N 100 which she is taking every 8 hours as      needed.  4.  Stressed the importance of regular exercise and stretching once again      with this patient.  5.  Will see the nurse clinic in one month and follow up with me in two      months' time.       ZTS/MedQ  D:  05/05/2005 13:38:03  T:  05/06/2005 07:31:41  Job #:  [id number]

## 2011-02-17 NOTE — H&P (Signed)
[name], DOTTS                ACCOUNT NO.:  [id number]   MEDICAL RECORD NO.:  [id number]          PATIENT TYPE:  INP   LOCATION:  3729                         FACILITY:  [facility]   PHYSICIAN:  Kela Millin, M.D.DATE OF BIRTH:  1956/06/10   DATE OF ADMISSION:  01/01/2007  DATE OF DISCHARGE:  01/02/2007                              HISTORY & PHYSICAL   PRIMARY CARE PHYSICIAN:  Holley Bouche, M.D.   CHIEF COMPLAINT:  Chest pain.   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   She said she began having chest pain on the a.m. of admission and it had  been going on for about 8 hours by the time I saw the patient.  She  describes the pain as 8/10 in intensity, pressure radiating up to jaws  and through shoulder blades and is midsternal in location.  She admits  to nausea, vomiting and associated shortness of breath and diaphoresis.  She denies cough, fever, dysuria, melena, diarrhea and no hematemesis.  She reports that she has also had left sided numbness for some time and  has a follow up appointment with her neurologist regarding this.   The patient was seen in the ER and an EKG was negative for ischemic  changes.  Point of care markers were negative.  She is admitted to the  [facility] for further evaluation and management.   PAST MEDICAL HISTORY:  As stated above.   MEDICATIONS:  1. Flexeril 5 mg b.i.d. and 10 q.h.s.  2. Lasix 40 mg daily.  3. ProAir HFA.  4. Zomig 10 mg.  5. Indomethacin 25 mg t.i.d.  6. Viscous lidocaine p.r.n.  7. Phenergan p.r.n.  8. Sanctura 20 mg b.i.d.  9. Ativan 1 mg t.i.d.  10.Darvocet N-100 p.r.n. q.8 h.  11.Duragesic patch 25 mcg q.72 h.  12.Neurontin 100 mg q.i.d.  13.Benadryl p.r.n.  14.Colace p.r.n.  15.Dulcolax p.r.n.  16.Lidoderm 5% patch 1-3 patches on cue 12 hours then off cue 12 hours      p.r.n.   ALLERGIES:  ABILIFY, AVELOX, BEXTRA, CYMBALTA, DALMANE, DEPAKOTE,  DURACT, ENABLEX, ERYTHROMYCIN.   SOCIAL HISTORY:  Denies tobacco.  Also, denies alcohol.   FAMILY HISTORY:  Mother had lung cancer.  Family history also positive  for stroke.   REVIEW OF SYSTEMS:  As per HPI.  Other review of systems negative.   PHYSICAL EXAMINATION:  GENERAL:  The patient is a middle aged white  female.  She is alert and oriented in no respiratory distress.  VITAL SIGNS:  Temperature 98.2, blood pressure 118/76, pulse 100,  respirations 16, O2 saturation 94%.  HEENT:  PERRL.  EOMI.  Mucous membranes moist.  No oral exudate.  NECK:  She has a soft neck  collar on.  No JVD.  LUNGS/CHEST:  She has reproducible chest wall tenderness on exam over  her mid sternal area.  Her lungs are clear to auscultation bilaterally.  No crackles or wheezes.  CARDIOVASCULAR:  Regular rate and rhythm.  Normal S1, S2.  No gallops  and no murmurs present.  ABDOMEN:  Soft, bowel sounds present.  Epigastric tenderness.  Nondistended.  No organomegaly and no masses palpable.  EXTREMITIES:  She has a wrist splint over her left wrist.  No edema and  no cyanosis.  NEUROLOGICAL:  She is alert and oriented x3.  Nonfocal exam.   LABORATORY DATA:  Sodium 137, potassium 3.2, chloride 100, BUN 14,  glucose 100, pH is 7.42 and the hematocrit is 34 with a hemoglobin of  11.6.  Creatinine is 0.7.  Point of care markers are negative.   ASSESSMENT/PLAN:  1. Chest pain/musculoskeletal/costochondritis and GI etiologies more      likely than cardiac.  As discussed above, the patient is on chronic       Indomethacin three times a day and has a reproducible chest wall      tenderness on exam as well as epigastric tenderness.  I will obtain      serial cardiac enzymes to rule out MI, also obtain a D. dimer,      check a fasting lipid profile as well.  Place patient on aspirin,      nitroglycerin.  Will also place the patient on PPI to cover for GI      etiology as well as p.r.n. Toradol and follow.  2. Fibromyalgia/myofascial pain/chronic pain.  The patient is managed      by [facility] Fortino [facility].  Will continue her outpatient pain      medications.  3. Chronic migraine headaches and history of left sided numbness,      followed by Neurology.  Patient states she has a followup      appointment with Dr. Sandria Manly.  4. Hypokalemia.  Replaced potassium.      Kela Millin, M.D.  Electronically Signed     ACV/MEDQ  D:  01/02/2007  T:  01/02/2007  Job:  [id number]   cc:   Holley Bouche, M.D.  Dr. Fortino Sic

## 2011-02-17 NOTE — Assessment & Plan Note (Signed)
HISTORY OF PRESENT ILLNESS:  Patty Salas is back regarding her fibromyalgia  symptoms.  She had an incident earlier this week where she was pulling a wet  king sized blanket out of the dryer and her neck and mid back popped.  She  has had problems with neck pain, numbness into the arm as well as into the  right thoracic spine.  She also complains of new low back pain.  She states  that the pain is a 10+ level.  She states this is the worse pain she has had  sine 1995.  She has had no further review or medical workup.  Apparently,  she is scheduled to see Dr. Barnett Abu soon regarding her spine.  The  patient describes pain as sharp, stabbing, constant and worse with bending,  sitting and some activities.  It improves with rest, heat, medications,  injections.  Lidoderm patches are helpful.   REVIEW OF SYSTEMS:  The patient reports weakness, numbness, dizziness,  spasm, depression, anxiety, weight gain, nausea, constipation, swelling and  occasional pain in the big toes.   SOCIAL HISTORY:  The patient remains married.  There are no new issues to  report today.   PHYSICAL EXAMINATION:  VITAL SIGNS:  Blood pressure 123/75, pulse 94,  respirations 16, saturating 100% on room air.  GENERAL APPEARANCE:  The patient's affect is generally appropriate.  She has  gained a little bit of weight since last visit.  NEUROLOGICAL:  The patient continues to have mild thoraco level of  scoliosis.  She has tightness of the right thoracic paraspinals, and also  palpated tenderness and tightness along the bilateral trapezius areas while  in the middle third.  She had some pain along the cervical paraspinals at  C3.  Right lumbar paraspinals were tender as well today as well as the left  more so.  She had 5/5 strength in all extremities tested except with pain  inhibition present at times.  Sensory exam was intact.  Reflexes were 2+.  Cognitively, the patient was appropriate.  She appeared to have good  balance  and gait today.  HEART:  Regular rate and rhythm.  LUNGS:  Clear.  ABDOMEN:  Soft and nontender.  SKIN:  Remains scaly.   ASSESSMENT:  1.  Fibromyalgia with myofascial features.  2.  Bipolar disorder.  3.  Somatoform pain disorder.  4.  Migraine headaches.  5.  Restless leg syndrome.  6.  History of right shoulder rotator cuff tendinitis.   PLAN:  1.  After informed consent, we injected multiple trigger points at the      bilateral cervical paraspinals bilateral trapezius areas, right thoracic      trigger point and left lumbar trigger point, each with 2 cc of 1%      lidocaine.  The patient was experiencing relief before leaving the      office today.  2.  Phentanyl patch is at 50 mcg q.72h.  3.  Continue Darvocet N-100 one q.8h. p.r.n.  4.  Continue to emphasize and discuss exercise for stretching and      strengthening for her back and extremity musculature.  She seems willing      to do this, although, we have had the same discussion on multiple      occasions.  5.  The patient will follow up with Dr. Danielle Dess regarding her neck.  6.  See the patient back in about two months time.       ZTS/MedQ  D:  04/07/2005 11:53:09  T:  04/07/2005 13:08:06  Job #:  [id number]

## 2011-02-17 NOTE — Discharge Summary (Signed)
Patty Salas, TOUCH NO.:  [id number]   MEDICAL RECORD NO.:  [id number]          PATIENT TYPE:  INP   LOCATION:  3729                         FACILITY:  [facility]   PHYSICIAN:  Armanda Magic, M.D.     DATE OF BIRTH:  Feb 15, 1956   DATE OF ADMISSION:  02/06/2007  DATE OF DISCHARGE:  02/07/2007                               DISCHARGE SUMMARY   ADMISSION DIAGNOSIS:  Chest pain.   DISCHARGE DIAGNOSES:  1. Chest pain, atypical, resolved.  2. Stress Cardiolite negative for myocardial ischemia or infarction      with an ejection fraction 80% on Feb 07, 2007.  3. Normal left ventricular systolic function via 2-D echocardiogram,      Feb 06, 2005.  4. Hypokalemia, repleted.  5. Dyslipidemia.  6. Chronic migraine headaches.  7. Fibromyalgia with mild facial pain.  .  8. History of syncopal episodes, followed by Dr. Melbourne Abts.  9. Anemia, stable.  10.Overactive bladder.   1. Chronic back pain.  2. History of asthma.  3. Carpal tunnel syndrome.  4. Osteoarthritis.  5. Scoliosis.  6. Bipolar disorder.  7. Irritable bowel syndrome.  8. Insomnia.  9. Restless leg syndrome.  10.History of the ventilator dependent respiratory failure secondary      to tricyclic antidepressant and benzodiazepine overdose.  11.Mild cardiac enlargement and with vascular congestion.   CONSULTATIONS:  None.   PROCEDURES:  None.   HISTORY OF PRESENT ILLNESS:  Patty Salas is a 55 year old Caucasian  female with no known history of coronary artery disease.  She has a  history of multiple co-morbidity and allergies. She was admitted to the  [facility] on Feb 06, 2007, with chest pain.  A 12-lead EKG was  performed upon arrival and revealed normal sinus rhythm with a  ventricular rate of 91 beats per minute.  There was no evidence of acute  ischemic changes.  Serial cardiac enzymes were negative x3 with a peak  troponin of 0.02.  D-dimer was within normal limits.  BNP was also  within  normal limits.  Portable chest x-ray revealed mild cardiac  enlargement with vascular congestion.  No acute CHF or pneumonia.  It  also revealed scoliosis.  The patient was started on IV nitroglycerin  and subcutaneous  Lovenox which was later discontinued.  On Feb 07, 2007,  she underwent a stress Cardiolite that was negative for evidence of  myocardial ischemia or infarct with an ejection fraction of 80%.  Normal  LV wall motion was noted.  Query left ventricular hypertrophy.  A 2-D  echocardiogram was also performed revealing normal LV systolic function  without regional wall motion abnormalities.  A stat D-dimer was obtained  and was within normal limits.  For the remainder of the admission, the  patient remained chest pain free and was discharged to home in stable  condition on Feb 07, 2007.  She was seen and examined by Dr. Armanda Magic who was in agreement with the discharge decision.   LABORATORY DATA:  Serial cardiac enzymes  showed  CK total 143, 128, and  104.  CK-MB 3.8, 2.4, and 1.6 respectively.  Troponin I 0.02 x3.  Point-  of-care enzymes showed myoglobin 67 and 107 respectively.  CK-MB 4.8 and  3.8 respectively.  Troponin I less than 0.05 x2.  BNP less than 30.  D-  dimer of 0.46.  PT 14.2, INR 1.1, PTT 31.  Fasting lipid panel:  Total  cholesterol 179, triglycerides 144, HDL 30, LDL 120.  Sodium 148,  potassium 3.5, chloride 106, CO2 29, glucose 90, BUN 9, creatinine 0.58,  calcium 8.4, magnesium 1.9. White blood count 5.8, hemoglobin 9.3,  hematocrit 27.7, platelets 326,000.  X-rays as stated in the hospital  course.  The 12-lead EKGs as stated in the hospital course.   CONDITION ON DISCHARGE:  Stable.   DISCHARGE MEDICATIONS:  1. Verapamil 40 mg daily for migraine headache prevention  2. Flexeril 10 mg twice daily and 5 mg at noon  3. Lasix 40 mg one to two tablets daily.  4. Indomethacin 25 mg three times daily.  5. Pro-Air 2 puffs as needed.  6. EpiPen as  needed.  7. Zomig 5 mg as needed.  8. Phenergan 25 mg as needed.  9. Lidocaine viscous as needed.  10.Sanctura 20 mg twice daily.  11.Fentanyl patch 25 mcg per hour every 72 hours.  12.Neurontin 100 mg four times daily.  13.Ativan 1 mg three times daily.  14.Darvocet-N 100 one to two tablets every 4 to 6 hours as needed for      pain.  15.Lidocaine patch 5% one to three patches daily as needed, on in the      a.m. and off in the p.m.  16.Vivelle 0.1 mg monthly on Saturdays.  17.Dulcolax as needed.  18.Benadryl as needed.  19.Docusate sodium as needed.  20.Bellergal-S.  21.Imodium as needed.   DISCHARGE INSTRUCTIONS:  1. Continue a low cholesterol and low fat diet.  2. Stop any activity that causes chest pain, shortness of breath,      dizziness, sweating, or excessive weakness.  3. No restrictions upon activity.   FOLLOW UP:  Follow up with Dr. Holley Bouche at [facility] with one to two weeks.  The patient should call for an  appointment.  No further cardiac workup needed at this time.      Patty Salas, [place]      Armanda Magic, M.D.  Electronically Signed    RDM/MEDQ  D:  02/08/2007  T:  02/09/2007  Job:  [id number]   cc:   Holley Bouche, M.D.

## 2011-02-17 NOTE — Assessment & Plan Note (Signed)
REASON FOR EVALUATION:  Patty Salas is back regarding her fibromyalgia and  chronic pain.  She continues to go through many psychosocial stressors  involving her husband and family.  She tells me that she has had some flares  of her foot pain, which is still felt to be gout.  She states that the  Indocin has helped this time.  She has been able to hold it down a bit  better with the Phenergan.  She states that the Lidoderm patches have been  helpful for her pain symptoms in her feet and she is trying to place those  on a regular basis on the soles of her feet.  Ice has been helpful to a  certain extent.  She had a visit to the ER 1 to 2 weeks ago with a migraine  headache apparently and some numbness in the face.  She was referred to see  Dr. Genene Churn. Love for neurological evaluation.  She continues to have pain  and in diffuse areas including the arms, legs, neck, thoracic back and  lumbar spine.  Pain is rated at a 6 out of 10 on average.  She sees Dr.  Daine Floras for psychiatric support, as well as a psychologist.   She continues on Darvocet for her pain 1 q.6 h. p.r.n. essentially.   REVIEW OF SYSTEMS:  On review of systems, the patient reports some nausea  and vomiting as well as occasional diarrhea.  She denies chest pain or  shortness of breath.  Headaches are noted above.  No spasms per se today.  Does have left flank pain at times.  Denies any visual complaints.  Psychiatrically, she has been anxious and irritable at times.  She has some  problems sleeping.  She had some swelling that was felt to be secondary to  Neurontin and she decreased to 300 mg t.i.d.   PHYSICAL EXAMINATION:  GENERAL:  On physical examination today, patient is  anxious and in her usual frame of mind.  Had multiple paperwork and items in  the room for me to peruse.  VITAL SIGNS:  Vital signs were essentially stable.  EXTREMITIES:  Her feet did look a little bit better today with no overt  swelling or  erythema.  They were less painful to touch today.  NEUROMUSCULAR:  She was ambulating with a fairly normal gait pattern.  Posture is still slightly forward in the cervicothoracic region.  Cognitively, she was appropriate but her thought process was very scattered  and difficult to follow.   ASSESSMENT:  1. Fibromyalgia.  2. Myofascial pain.  3. Restless leg syndrome.  4. Bipolar disorder.  5. Cervical disk and degenerative joint disease.  6. Bilateral foot pain/questionable gout.   PLAN:  1. I am not inclined to make medication changes today, as she needs to deal     with her psychiatric issues first and foremost.  I think she needs more     aggressive medical management of her mood.  She certainly is in need of     regular psychological counseling to deal with her new living situations     and some of her manic behaviors.  2. I refilled Darvocet today, #100.  We discussed the fact that I do not     want her to use this as a crutch for increases in the pain.  3. She may use the Lidoderm patches for pain on the feet and her right flank     region.  4. We will continue Neurontin 800 mg t.i.d.  5. We will continue with Gabitril 8 mg nightly.  6. I will see her back in approximately 2 months' time.      Ranelle Oyster, M.D.   ZTS/MedQ  D:  11/24/2003 13:50:53  T:  11/24/2003 14:41:27  Job #:  [id number]   cc:   Gregary Signs A. Everardo All, M.D. [facility]   Daine Floras, M.D.  [address]  [place]  [place] [address]  Fax: [phone]
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
## 2011-02-17 NOTE — Assessment & Plan Note (Signed)
HISTORY OF PRESENT ILLNESS:  Patty Salas is back regarding her fibromyalgia  syndrome and multiple other pain complaints.  She has done fairly well since  last visit.  She did develop some mouth sores which may be viral in origin.  She presently will talk with her family doctor regarding these.  She has  seen Dr. Danielle Dess regarding her neck, and he has recommended no surgical  intervention at this point.  Pain fluctuates from a 6-10/10.  Complains of  pain in the neck, particularly today on the right side greater than left as  well as over the shoulders.  She has some generalized pain throughout the  arms, legs and low back still today.  She describes her pain as sharp and  constant and interfering with general activity, relation with others and  enjoyment of life on a severe level.  The pain seems to be worse throughout  the morning and evening hours.  Pain increases with bending, sitting and  some activities and improves with rest, heat, ice, medications, injections.   REVIEW OF SYSTEMS:  The patient reports spasms, dizziness, depression,  anxiety, numbness, weakness, occasional bladder control problems, nausea,  occasional constipation, decreased appetite, pain in her mouth, left sided  hand numbness due to her carpal tunnel syndrome.   SOCIAL HISTORY:  The patient lives with her husband.  She has been trying to  work on a regular stretching and exercise at home.   PHYSICAL EXAMINATION:  VITAL SIGNS:  Blood pressure 126/50, pulse 96,  respirations 16, saturation 96% on room air.  GENERAL APPEARANCE:  The patient is pleasant in no acute distress.  She is  generally her usual self today from an affect standpoint.  Gait is stable.  Reflexes are 2+.  Motor function is 5/5.  Sensory exam is inconsistent but  appeared to be stable and intact.  HEART:  Regular rate and rhythm.  LUNGS:  Clear.  ABDOMEN:  Soft, nontender.  SKIN:  Revealed some macular papular rash on the shoulders and chest.  I  did  not examine her mouth today.  EXTREMITIES:  She had trigger points over the right bilateral splenius  capitus areas, right greater than left, as well as, right trapezius greater  than left trapezius today.  The patient had multiple other tender points  throughout the extremities, chest, low back, etc.  Posture was fair.  She  still tends to favor the right side with the right side being superior and  somewhat protracted at the shoulders compared to the left.   ASSESSMENT:  1.  Fibromyalgia with myofascial features.  2.  Bipolar disorder.  3.  Somatoform pain disorder.  4.  Migraine headaches.  5.  Restless leg syndrome.  6.  History of right rotator cuff tendinitis.  7.  Questionable carpal tunnel syndrome.   PLAN:  1.  After informed consent, we injected four trigger points at the bilateral      splenius capitus and  trapezius muscles using 2 cc of 1% lidocaine.  The      patient tolerated the injections well and had results when she left the      room today.  She will continue to apply ice and perform stretching      arranged to the neck and shoulder areas.  2.  Will stay with the Fentanyl patch at 50 mcg q.72 h and Darvocet N-100      q.8 h p.r.n.  She will follow up with the nurse clinic in about  three      weeks for maintenance of her medications.  3.  Continue stretching and exercise program.  4.  We discussed the new ointment called 02 for fibromyalgia that she may      find beneficial for some of her tender points.  I will see her back in      about two months time.      Ranelle Oyster, M.D.  Electronically Signed     ZTS/MedQ  D:  05/31/2005 10:15:27  T:  05/31/2005 11:53:33  Job #:  [id number]
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
## 2011-02-17 NOTE — Assessment & Plan Note (Signed)
Patty Salas is back following her chronic pain and fibromyalgia. She states she  has had some increased problems with migraine headaches. She had a palsy  and episode of aura with the last severe set of migraines on the 7th.  Workup has been negative. She continues to follow with her primary doctor  for basic medical care, including recent respiratory and intestinal issues.  Dr. Betti Cruz follows her for her mood. She also has regular psychological  support. She remains on Duragesic 25 mcg every 72 hours. She is on Percocet  2, sometimes 3 a day for breakthrough pain. The patient complains of diffuse  pain over multiple joints, low back and legs. She often has pain in her back  when she awakes in the morning, and has difficulty lying flat. She rates her  pain 8-9 out of 10, described as a sharp, burning, constantly stabbing. The  patient interferes with general activity, her relations with others, in term  of life on a  moderate to severe level. Sleep is fair.   SOCIAL HISTORY:  The patient continues to struggle with her relationship  with her husband, which is becoming more and more disjointed.   REVIEW OF SYSTEMS:  The patient reports multiple items today, including  recent problems with taste, hearing and equilibrium as associated with her  migraine headaches. She also complains of nausea and vomiting.   PHYSICAL EXAMINATION:  Blood pressure is 134/51, pulse is 106, respirations  16, she sating on 95% of room air. The patient is generally pleasant and in  no acute distress. She is alert and orientated x3. She ambulated for me  today and walked with a shuffling-type gait with a  flexed posture. Strength  was generally preserved. No extremity edema was seen. She did have  significant valgus deformities at both first toes with redness and erythema,  particularly on the right side today. Sensory examination was grossly intact  in distal extremities. Heart was tachycardiac. Chest was clear.  Abdomen:  Soft and nontender. The patient had multiple areas of tenderness throughout  the arms, trunk, stomach, chest, neck, etc.   ASSESSMENT:  1. Complicated picture involving fibromyalgia syndrome and somatoform pain      disorder.  2. Chronic headaches which are migrainous and perhaps cluster in origin.      She has been followed by neurology for these.  3. Bipolar disorder  4. Carpal tunnel syndrome.  5. Left rotator cuff syndrome.  6. Right rotator cuff impingement.  7. Osteoarthritis of the feet, and significant valgus deformities of the      first toes bilaterally.  8. Myofascial pain.  9. Insomnia.   PLAN:  1. Recommend trial of postural practice with [facility] outpatient      therapy. Work on balance, equilibrium, etc. It sounds as if some of her      transient nerve palsy is affecting her balance, taste, hearing, etc.  2. Refill Darvocet-N 100, #90 for breakthrough pain.  3. Refill Duragesic patch 25 mcg q.72 h., #10.  4. Encouraged balance activities and appropriate gait. We demonstrated      some activities along these lines today.  5. We held off on trigger points today, as patient's pain was diffuse in      nature and I did not see the utility in      providing these today.  6. I will see the patient back in three month's time. She will followup      with the nurse clinic  in one month.      Ranelle Oyster, M.D.  Electronically Signed     ZTS/MedQ  D:  08/21/2006 15:12:12  T:  08/21/2006 16:15:45  Job #:  [id number]   cc:   Daine Floras, M.D.  Fax: [phone]   Holley Bouche, M.D.  Fax: [phone]

## 2011-02-17 NOTE — Procedures (Signed)
NAME:  Patty Salas, Patty Salas NO.:  [id number]   MEDICAL RECORD NO.:  [id number]                   PATIENT TYPE:  REC   LOCATION:  [facility]                                  FACILITY:  [facility]   PHYSICIAN:  Ranelle Oyster, M.D.             DATE OF BIRTH:  1956/07/05   DATE OF PROCEDURE:  06/15/2004  DATE OF DISCHARGE:                                 OPERATIVE REPORT   MEDICAL RECORD NUMBER:  [id number].   PROCEDURE:  Trigger point injection.   DIAGNOSES:  Myofascial pain, post traumatic after a fall, ICD-9 723.9, and  fibromyalgia, 729.1.   The patient apparently fell after loosing balance when sedated by a new  medication from her urologist. The patient lost balance, felt backwards onto  the floor. She has had significant pain in the thoracic region as well as  the left low back. On examination today, she had some myofascial trigger  points in the left lower paraspinals at approximately L4-L5 on the left. She  also had taut veins notable at the latissimus dorsi at proximally left T6  and right T4. There may have been some rhomboid involvement at T4. After  informed consent today and preparation with alcohol, we injected all three  of these trigger points each with 2 cc of 1% lidocaine. The patient  tolerated this well.   She will follow up with me at her regularly scheduled appointment later this  month.                                                Ranelle Oyster, M.D.    ZTS/MEDQ  D:  06/15/2004 11:50:50  T:  06/15/2004 13:10:29  Job:  [id number]

## 2011-02-17 NOTE — H&P (Signed)
[facility]  Patient:    Patty Salas, Patty Salas                       MRN: [id number] Adm. Date:  [id number] Attending:  Denny Peon                         History and Physical  PATIENT IDENTIFICATION:  Patty Salas is a 55 year old white married female who is readmitted with history of increasing confusion and depression.  HISTORY OF PRESENT ILLNESS:  The patient had just been discharged from [facility] on August 20, 2000, after being treated for substance abuse and depression.  She presented to the partial program yesterday in a confused and disorganized state, with rambling speech and disorganized thinking.  She denies any increase in her medications or use of nonprescribed medications, although she has had problems with this in the past.  She reports feeling increasingly depressed and despondent because "my daughter died, my mother died, and my husband wants a divorce."  She reports feeling hopeless and anxious and describes some obsessive-compulsive behaviors, stating that she tends of obsess over things and to repeatedly clean things.  Given her disorganization, it was felt that inpatient stabilization was necessary.  PAST PSYCHIATRIC HISTORY:  The patient had been in treatment for about the last 15 years at [facility] for depression and anxiety. She had had several hospitalizations at [facility] from [place] through 1998.  Recently, she was hospitalized at [facility] within the past 3 weeks, and then again at [facility] from November 15 through August 20, 2000.  She acknowledges problems with prescription medication abuse.  She was discharged on Prozac 40 mg q.a.m. and 20 mg qnoon, Neurontin 400 mg t.i.d., and Zyprexa 5 mg b.i.d. and 10 mg q.h.s.  PAST MEDICAL HISTORY:  The patient is followed by Dr. Everardo All.  Dr. Sandria Manly is her neurologist.  She has a history of  migraine headaches and irritable bowel syndrome.  She has been on Cytomel 25 mcg q.d. and was on potassium gluconate. She reports being allergic to penicillin, aspirin, and nonsteroidal anti-inflammatory drugs.  SOCIAL HISTORY:  The patient has been married for 18 years.  Her 31 year old daughter died in 2001/06/23as a result of a motor vehicle accident which had left her quadriplegic for many months.  Patients mother died 3 years ago. Her husband apparently is seeking a divorce now.  Patient has a high school education and one year of college.  She has been unemployed.  Of note, she had functioned quite well when she was caring for her daughter for many months. Following her daughters death, she has decompensated and has not recovered.  FAMILY HISTORY:  The patients brother has a questionable history of psychiatric illness.  REVIEW OF SYSTEMS and PHYSICAL EXAM:  Patient has had no interim changes in her physical health.  MENTAL STATUS EXAMINATION:  The patient presents as a casually dressed middle aged white female.  Her speech is very soft but rambling.  Thought processes are mildly disorganized.  She denies suicidal ideation.  Her mood is depressed and affect is rather flat.  Oriented x 3.  Cognitive function is intact but she is disorganized as noted.  ADMISSION DIAGNOSES: Axis I:    1. Major depression with psychotic features, severe.            2. History of polysubstance  abuse. Axis II:   Personality disorder not otherwise specified. Axis III:  No diagnosis. Axis IV:   Psychosocial stressors severe. Axis V:    Global assessment of function current is 40, highest past year 60.  INITIAL PLAN OF CARE:  Patient will be continued on her Zyprexa, Prozac and Neurontin.  We may later need to decrease her Neurontin.  We need to see if she can stabilize in the structured environment.  We need to look into a long term rehab. DD:  08/23/00 TD:  08/23/00 Job: [id number]

## 2011-02-23 ENCOUNTER — Encounter: Payer: 59 | Admitting: Neurosurgery

## 2011-03-01 ENCOUNTER — Encounter: Payer: 59 | Attending: Physical Medicine & Rehabilitation | Admitting: Neurosurgery

## 2011-03-01 DIAGNOSIS — G5600 Carpal tunnel syndrome, unspecified upper limb: Secondary | ICD-10-CM | POA: Insufficient documentation

## 2011-03-01 DIAGNOSIS — IMO0001 Reserved for inherently not codable concepts without codable children: Secondary | ICD-10-CM | POA: Insufficient documentation

## 2011-03-01 DIAGNOSIS — M5430 Sciatica, unspecified side: Secondary | ICD-10-CM

## 2011-03-01 DIAGNOSIS — G43909 Migraine, unspecified, not intractable, without status migrainosus: Secondary | ICD-10-CM | POA: Insufficient documentation

## 2011-03-01 DIAGNOSIS — M519 Unspecified thoracic, thoracolumbar and lumbosacral intervertebral disc disorder: Secondary | ICD-10-CM | POA: Insufficient documentation

## 2011-03-02 NOTE — Assessment & Plan Note (Signed)
The patient is back today with multiple pain complaints.  She is talking to me about a fall that she had taken at [facility] injuring her right shoulder and right side.  She states she lost consciousness.  Since that time, she has had pain in her left arm with some numbness.  She rates her pain in general at about a 9 or 10, it is constant, same 24 hours a day.  Medications tend to help.  All activities aggravate her pain and her fibromyalgia pain.  She states she uses a cane or walker and she walks without assistance, not sure exactly what her normal is.  She needs help with transfers.  She does drive.  REVIEW OF SYSTEMS:  Noted for those difficulties scribed above as well as many issues with weight loss, skin breakdown, abdominal pain, poor appetite, constipation, diarrhea, vomiting, nausea, bowel and bladder control, weakness, numbness, paresthesias throughout, spasms, but no suicidal thoughts, no apparent aberrant behavior.  PAST MEDICAL HISTORY:  Unchanged.  SOCIAL HISTORY:  Married, lives with her husband.  FAMILY HISTORY:  Unchanged.  PHYSICAL EXAMINATION:  VITAL SIGNS:  Blood pressure 127/80, pulse 108, respirations 20, O2 sats 96 on room air. NEUROLOGIC:  Her motor strength is about 4/5 throughout.  She gives way to pain.  Her sensation is intact. CONSTITUTIONAL:  She is within normal limits.  She is oriented x3.  Mood appears anxious.  She has new labs, they are vitamin D is 6.4, potassium 3.7 through [employer].  ASSESSMENT: 1. Fibromyalgia. 2. Migraines. 3. Left shoulder pain, unknown etiology. 4. Degenerative disk disease.  PLAN: 1. I went ahead and refilled her Fentanyl 25 mcg transdermal one every     72 hours, 10 with no refill; Norco 5/325 one p.o. q.6 h., 90 with     no refill. 2. The patient will follow up with [name] in 1 month. 3. She requested injection in shoulder with 2% lidocaine.  After     informed consent, alcohol prep, the posterior  aspect of her     proximal biceps tendon injected with 2% of lidocaine.  She     tolerated it well.  We will see her back in the clinic in 1 month     with [name].  Questions were encouraged and answered.     Cornellius Kropp L. Blima Dessert Electronically Signed    RLW/MedQ D:  03/01/2011 13:42:34  T:  03/02/2011 03:27:02  Job #:  [id number]

## 2011-03-28 ENCOUNTER — Encounter: Payer: 59 | Attending: Physical Medicine & Rehabilitation | Admitting: Physical Medicine & Rehabilitation

## 2011-03-28 DIAGNOSIS — M7520 Bicipital tendinitis, unspecified shoulder: Secondary | ICD-10-CM

## 2011-03-28 DIAGNOSIS — G5600 Carpal tunnel syndrome, unspecified upper limb: Secondary | ICD-10-CM

## 2011-03-28 DIAGNOSIS — M5137 Other intervertebral disc degeneration, lumbosacral region: Secondary | ICD-10-CM | POA: Insufficient documentation

## 2011-03-28 DIAGNOSIS — F341 Dysthymic disorder: Secondary | ICD-10-CM

## 2011-03-28 DIAGNOSIS — IMO0001 Reserved for inherently not codable concepts without codable children: Secondary | ICD-10-CM | POA: Insufficient documentation

## 2011-03-28 DIAGNOSIS — G43909 Migraine, unspecified, not intractable, without status migrainosus: Secondary | ICD-10-CM | POA: Insufficient documentation

## 2011-03-28 DIAGNOSIS — M719 Bursopathy, unspecified: Secondary | ICD-10-CM | POA: Insufficient documentation

## 2011-03-28 DIAGNOSIS — M67919 Unspecified disorder of synovium and tendon, unspecified shoulder: Secondary | ICD-10-CM | POA: Insufficient documentation

## 2011-03-28 DIAGNOSIS — M51379 Other intervertebral disc degeneration, lumbosacral region without mention of lumbar back pain or lower extremity pain: Secondary | ICD-10-CM | POA: Insufficient documentation

## 2011-03-28 NOTE — Assessment & Plan Note (Signed)
Patty Salas is back regarding her multiple pain complaints.  She saw [place] last month and had her left biceps tendon injection injected.  She only had mild results with this.  She is having further issues with her husband at home related to his alcoholism.  Her gout has been flaring up.  She rates her pain at 10/10.  REVIEW OF SYSTEMS:  Notable for spasms.  She reports multiple other issues which are all written in detail in the written health and history section of the chart.  SOCIAL HISTORY:  Noted above.  She states that she has been married to her husband for 30 years.  PHYSICAL EXAMINATION:  VITAL SIGNS:  Blood pressure is 141/53, pulse 109, respiratory rate 18, she is satting 94% on room air. GENERAL:  The patient is pleasant, alert, and oriented x3.  Affect is generally bright and appropriate.  She is anxious per baseline. MUSCULOSKELETAL:  She has tenderness along the long head biceps tendon of the left shoulder.  Short head is less tender.  She has some generalized shoulder pain, but nothing as painful as the long head tendon.  Her typical trigger points along the right trapezius as well as left quadratus lumborum region.  Posture is fair.  I did not examine her feet today. HEART:  Regular rhythm, but tachycardiac. CHEST:  Clear. ABDOMEN:  Soft, nontender.  ASSESSMENT: 1. Fibromyalgia. 2. Migraine headaches. 3. Left bicipital tendonitis. 4. Degenerative disk disease, lumbar spine. 5. Myofascial pain.  PLAN: 1. I injected 2 trigger points along the right trap and left quadratus     lumborum each with 2 mL 1% lidocaine. 2. We injected the left shoulder around the long head biceps tendon     and the patient had some relief with pain before she  left the     office today. 3. Refill fentanyl and hydrocodone today. 4. Asked patient again to look out for herself first before giving     into her husband as he continues to be dominating force in her     life. 5. She will see my  nurse practitioner in a month and I will schedule     her back with me in about 3 months.     Patty Salas, M.D. Electronically Signed    ZTS/MedQ D:  03/28/2011 11:58:11  T:  03/28/2011 23:16:04  Job #:  [id number]  cc:   Patty Salas, M.D. Fax: [phone]

## 2011-04-25 ENCOUNTER — Encounter: Payer: 59 | Attending: Neurosurgery | Admitting: Neurosurgery

## 2011-04-25 DIAGNOSIS — M542 Cervicalgia: Secondary | ICD-10-CM | POA: Insufficient documentation

## 2011-04-25 DIAGNOSIS — M545 Low back pain, unspecified: Secondary | ICD-10-CM

## 2011-04-25 DIAGNOSIS — IMO0001 Reserved for inherently not codable concepts without codable children: Secondary | ICD-10-CM | POA: Insufficient documentation

## 2011-04-25 DIAGNOSIS — M7520 Bicipital tendinitis, unspecified shoulder: Secondary | ICD-10-CM | POA: Insufficient documentation

## 2011-04-25 DIAGNOSIS — M25519 Pain in unspecified shoulder: Secondary | ICD-10-CM | POA: Insufficient documentation

## 2011-04-25 DIAGNOSIS — G43909 Migraine, unspecified, not intractable, without status migrainosus: Secondary | ICD-10-CM | POA: Insufficient documentation

## 2011-04-25 DIAGNOSIS — M47817 Spondylosis without myelopathy or radiculopathy, lumbosacral region: Secondary | ICD-10-CM | POA: Insufficient documentation

## 2011-04-25 NOTE — Assessment & Plan Note (Signed)
Patty Salas is a patient of Dr. Riley Salas.  She is seen for multiple pain complaints.  She states no new issues as of today, but she is going to be seeing an oncologist at the recommendation of her primary care to be evaluated for possibility of cancer.  This is through [facility]. The patient is very tearful today due to the fact that she feels like she is "scared" and she does not know what is going to happen.  Today, she describes her neck and shoulder back pain.  She rates her pain at a 10 plus.  It is sharp, burning that is constant.  Her general activity level is 10.  Pain same 24 hours a day.  Sleep patterns are poor.  All activities aggravate.  Rest, heat, and medication tend to help.  She walks without assistance.  She does have a cane and walker available. She climbs steps and drives.  REVIEW OF SYSTEMS:  Notable for difficulties described above as well as bladder and bowel control issues, paresthesias, trouble with spasms, depression, and anxiety.  No suicidal thoughts or aberrant behaviors are evident.  She has some skin problems, some GI issues.  Other than that, no acute changes.  PAST MEDICAL HISTORY:  Unchanged.  SOCIAL HISTORY:  She is married.  FAMILY HISTORY:  Unchanged.  PHYSICAL EXAMINATION:  Blood pressure 126/74, pulse 92, respirations 18, and O2 sats 91 on room air.  Motor strength 5/5 in the lower extremities.  Sensation is intact.  Constitutionally, she is within normal limits.  She is alert and oriented x3.  She is very anxious and somewhat scattered in thoughts.  ASSESSMENT: 1. Fibromyalgia. 2. History of migraines. 3. Left biceps tendonitis. 4. Degenerative disk disease, lumbar spine. 5. Myofascial pain. 6. Urologic problem.  She is following up at [facility] for     that.  Her Oswestry score 72.  PLAN: 1. Refill Norco 5/325 one p.o. q.6 h. p.r.n., #90 with no refill.  She     is instructed to take less if possible, but no more than  prescription. 2. Refill fentanyl.  We are going to up the transdermal patch from 25     to 50 mcg 1 transdermally every 72 hours, #10 with no refill.  She     will try all that for this month.  We will see her back in a month.     Her questions were encouraged and answered.     Patty Salas Electronically Signed    RLW/MedQ D:  04/25/2011 13:24:44  T:  04/25/2011 23:31:22  Job #:  [id number]

## 2011-05-25 ENCOUNTER — Encounter: Payer: 59 | Admitting: Neurosurgery

## 2011-05-26 ENCOUNTER — Ambulatory Visit: Payer: 59 | Admitting: Neurosurgery

## 2011-05-29 ENCOUNTER — Encounter: Payer: 59 | Attending: Physical Medicine & Rehabilitation | Admitting: Neurosurgery

## 2011-05-29 DIAGNOSIS — M719 Bursopathy, unspecified: Secondary | ICD-10-CM

## 2011-05-29 DIAGNOSIS — M67919 Unspecified disorder of synovium and tendon, unspecified shoulder: Secondary | ICD-10-CM

## 2011-05-29 DIAGNOSIS — IMO0001 Reserved for inherently not codable concepts without codable children: Secondary | ICD-10-CM | POA: Insufficient documentation

## 2011-05-29 DIAGNOSIS — M51379 Other intervertebral disc degeneration, lumbosacral region without mention of lumbar back pain or lower extremity pain: Secondary | ICD-10-CM | POA: Insufficient documentation

## 2011-05-29 DIAGNOSIS — M5137 Other intervertebral disc degeneration, lumbosacral region: Secondary | ICD-10-CM | POA: Insufficient documentation

## 2011-05-29 DIAGNOSIS — G43909 Migraine, unspecified, not intractable, without status migrainosus: Secondary | ICD-10-CM | POA: Insufficient documentation

## 2011-05-30 NOTE — Assessment & Plan Note (Signed)
This is a patient of [name] seen for multiple pain complaints as well as fibromyalgia.  The patient is still seen at [facility] for some urologic problem she is having to rule out any kind of cancer problem, still under various types of diagnostics.  She reports no change in her problem with pain.  She seems somewhat in a better mood today, although she does become tearful when talking about certain subjects.  She rates her pain as unchanged as sharp burning, stabbing pain that is constant.  General activity level is a 6-8.  Sleep patterns are fair.  All activities aggravate.  Rest, heat, medication therapy, and injections tend to help at times.  She walks with without assistance.  She can drive.  She can walk about 10 minutes at a time. She climbs steps.  She is unemployed.  REVIEW OF SYSTEMS:  Notable for all difficulties described above as well as some bowel and bladder control issues, tremors, tingling, spasms, depression, anxiety.  No suicidal thoughts or aberrant behaviors. Oswestry score 64.  She also has some weight gain or loss issues, some nausea and vomiting with abdominal pain.  PAST MEDICAL HISTORY:  Unchanged.  SOCIAL HISTORY:  She is married, lives with her husband.  FAMILY HISTORY:  Unchanged.  PHYSICAL EXAMINATION:  Blood pressure is 127/77, pulse 108, respirations 18, O2 sats 98 on room air.  Motor strength is 5/5 in the lower extremities to confrontation testing, iliopsoas, quadriceps, dorsiflexion, plantar flexors.  Sensation is intact.  Constitutionally, she is within normal limits.  She is alert and oriented x3.  She does tend to repeat herself in conversation.  ASSESSMENT: 1. Fibromyalgia. 2. History of left biceps tendonitis. 3. History of migraines. 4. Degenerative disk disease, lumbar spine. 5. Myofascial pain. 6. Urologic problems being followed at [facility].  PLAN: 1. Refill Norco 5/325 one p.o. q.6 h. p.r.n. 90 with no refill. 2.  Refill fentanyl.  We are going back to 25 mcg per her request, one     transdermally every 72 hours, 10 with no refill.  She will try to     wean herself off that.  Her questions were encouraged and answered.     We will see her back in the clinic in a month.     Eudora Guevarra L. Blima Dessert Electronically Signed    RLW/MedQ D:  05/29/2011 15:36:31  T:  05/29/2011 23:58:34  Job #:  [id number]

## 2011-06-26 ENCOUNTER — Encounter: Payer: 59 | Attending: Neurosurgery | Admitting: Neurosurgery

## 2011-06-26 DIAGNOSIS — M67919 Unspecified disorder of synovium and tendon, unspecified shoulder: Secondary | ICD-10-CM | POA: Insufficient documentation

## 2011-06-26 DIAGNOSIS — M47817 Spondylosis without myelopathy or radiculopathy, lumbosacral region: Secondary | ICD-10-CM | POA: Insufficient documentation

## 2011-06-26 DIAGNOSIS — F411 Generalized anxiety disorder: Secondary | ICD-10-CM | POA: Insufficient documentation

## 2011-06-26 DIAGNOSIS — R197 Diarrhea, unspecified: Secondary | ICD-10-CM | POA: Insufficient documentation

## 2011-06-26 DIAGNOSIS — F329 Major depressive disorder, single episode, unspecified: Secondary | ICD-10-CM | POA: Insufficient documentation

## 2011-06-26 DIAGNOSIS — F3289 Other specified depressive episodes: Secondary | ICD-10-CM | POA: Insufficient documentation

## 2011-06-26 DIAGNOSIS — R209 Unspecified disturbances of skin sensation: Secondary | ICD-10-CM | POA: Insufficient documentation

## 2011-06-26 DIAGNOSIS — M7989 Other specified soft tissue disorders: Secondary | ICD-10-CM | POA: Insufficient documentation

## 2011-06-26 DIAGNOSIS — G43909 Migraine, unspecified, not intractable, without status migrainosus: Secondary | ICD-10-CM | POA: Insufficient documentation

## 2011-06-26 DIAGNOSIS — R109 Unspecified abdominal pain: Secondary | ICD-10-CM | POA: Insufficient documentation

## 2011-06-26 DIAGNOSIS — M5137 Other intervertebral disc degeneration, lumbosacral region: Secondary | ICD-10-CM

## 2011-06-26 DIAGNOSIS — IMO0001 Reserved for inherently not codable concepts without codable children: Secondary | ICD-10-CM

## 2011-06-26 DIAGNOSIS — R112 Nausea with vomiting, unspecified: Secondary | ICD-10-CM | POA: Insufficient documentation

## 2011-06-26 DIAGNOSIS — M719 Bursopathy, unspecified: Secondary | ICD-10-CM | POA: Insufficient documentation

## 2011-06-26 NOTE — Assessment & Plan Note (Signed)
Account [id number].  This is a patient Dr. Riley Salas who was seen for multiple pain complaints of fibromyalgia.  She states her pain is somewhat better.  She has had no change.  She rates her pain 7-9.  It is a sharp stabbing constant pain.  General activity level is 9-10.  Pain is worse in the morning and at night.  Pain is worse walking, bending, and activity and standing. Rest, heat, medication injections to help.  She does walk with and without assistance.  She has a cane and walker both at home available. She climb steps and drives.  She can walk about 10 minutes at a time. She needs help with transfers at times.  She is unemployed.  REVIEW OF SYSTEMS:  Notable for those difficulties as well as bowel and bladder control issues, weakness, numbness, tingling, spasms, dizziness, confusion, depression, anxiety.  No suicidal thoughts or aberrant behaviors.  UDS is due next appointment if not collected today.  She does have some nausea and vomiting, diarrhea and GI issues as well as abdominal pain, limb swelling.  She was having some urologic workup including cystogram done next month at [facility].  PAST MEDICAL HISTORY, SOCIAL HISTORY, AND FAMILY HISTORY:  Unchanged.  PHYSICAL EXAMINATION:  VITAL SIGNS:  Blood pressure 146/66, pulse 0.1, respirations 16, O2 sats 100 on room air.  Motor strength and sensation are intact.  Constitutionally, she is within normal limits.  She is alert and oriented x3.  ASSESSMENT: 1. Fibromyalgia. 2. History of left biceps tendonitis. 3. History of migraines. 4. Degenerative disk disease of the lumbar spine. 5. Myofascial pain. 6. Urologic problems.  Followed up by [facility].  PLAN: 1. Refill Norco 5/325 one p.o. q.6 hours p.r.n. 90 with no refill. 2. Fentanyl transdermal 25 mcg one transdermally every 72 hours 10     with no refill.  She is talking about weaning off one for meds.     She will see Dr. Riley Salas in a month to discuss that with  him.  Her     questions were encouraged and answered.     Ariele Vidrio L. Blima Dessert Electronically Signed    RLW/MedQ D:  06/26/2011 16:06:10  T:  06/26/2011 22:08:09  Job #:  [id number]

## 2011-08-04 ENCOUNTER — Encounter: Payer: 59 | Admitting: Physical Medicine & Rehabilitation

## 2011-08-07 ENCOUNTER — Encounter: Payer: 59 | Attending: Physical Medicine & Rehabilitation | Admitting: Physical Medicine & Rehabilitation

## 2011-08-07 DIAGNOSIS — M5137 Other intervertebral disc degeneration, lumbosacral region: Secondary | ICD-10-CM | POA: Insufficient documentation

## 2011-08-07 DIAGNOSIS — IMO0002 Reserved for concepts with insufficient information to code with codable children: Secondary | ICD-10-CM

## 2011-08-07 DIAGNOSIS — G43909 Migraine, unspecified, not intractable, without status migrainosus: Secondary | ICD-10-CM | POA: Insufficient documentation

## 2011-08-07 DIAGNOSIS — M51379 Other intervertebral disc degeneration, lumbosacral region without mention of lumbar back pain or lower extremity pain: Secondary | ICD-10-CM | POA: Insufficient documentation

## 2011-08-07 DIAGNOSIS — M67919 Unspecified disorder of synovium and tendon, unspecified shoulder: Secondary | ICD-10-CM | POA: Insufficient documentation

## 2011-08-07 DIAGNOSIS — M7520 Bicipital tendinitis, unspecified shoulder: Secondary | ICD-10-CM

## 2011-08-07 DIAGNOSIS — IMO0001 Reserved for inherently not codable concepts without codable children: Secondary | ICD-10-CM

## 2011-08-07 DIAGNOSIS — F411 Generalized anxiety disorder: Secondary | ICD-10-CM | POA: Insufficient documentation

## 2011-08-07 DIAGNOSIS — M79609 Pain in unspecified limb: Secondary | ICD-10-CM | POA: Insufficient documentation

## 2011-08-07 DIAGNOSIS — N200 Calculus of kidney: Secondary | ICD-10-CM | POA: Insufficient documentation

## 2011-08-07 DIAGNOSIS — M719 Bursopathy, unspecified: Secondary | ICD-10-CM | POA: Insufficient documentation

## 2011-08-07 DIAGNOSIS — G5600 Carpal tunnel syndrome, unspecified upper limb: Secondary | ICD-10-CM | POA: Insufficient documentation

## 2011-08-07 NOTE — Assessment & Plan Note (Signed)
HISTORY:  Laiylah is back regarding her multiple issues.  She continues have a life of anxiety and poor interactions with her husband.  Her low back remains tender as her shoulders to a lesser extent today.  She does have left bicipital tendon pain which we addressed in the past.  Her right wrist and hands just bother her once again.  She wonders whether she needs an epidural injection, although she is having problems with kidney stones once more.  Her pain is about 7-8/10.  REVIEW OF SYSTEMS:  Please see detailed health and history section of the chart.  SOCIAL HISTORY:  Noted above.  PHYSICAL EXAMINATION:  VITAL SIGNS:  Blood pressure 138/71, pulse 93, respiratory rate 18, and she is satting 97% on room air. GENERAL:  The patient is pleasant and alert.  Remains verbose, but was very appropriate otherwise.  She has trigger point over the left lumbar paraspinals at L L5 level.  The long head biceps tendon was tender and insertion with pain with shoulder flexion against gravity.  Tinel sign was positive today and she had some tingling and tenderness over the median aspects of the hand.  Motor exam was otherwise unremarkable.  She is alert and oriented x3.  ASSESSMENT: 1. Fibromyalgia syndrome. 2. History of left bicipital tendonitis. 3. Migraine headaches. 4. Lumbar degenerative disk disease. 5. Kidney stones. 6. Myofascial pain. 7. Right carpal tunnel syndrome.  PLAN: 1. After informed consent, I injected left lumbar trigger point with 2     mL 1% lidocaine. 2. Additionally we injected the long head biceps tendon around left     biceps tendon with 40 mg Kenalog and 3 mL of 1% lidocaine. 3. We injected the right carpal tunnel via superior approach with 40     mg methylprednisolone and 2 mL of 1% lidocaine.  Sterile technique     was utilized with Betadine.  The patient tolerated well. 4. Refill her Norco and fentanyl patch 25 mcg today, #90 and #10, we     will refill it  respectively.  Discussed weaning and long-term     management with the patient who wants to come off these     medications.  Ultimately I told her it was up to her and how will     she was able to accomplish harmony at home and a more physical     lifestyle. 5. She will see my nurse practitioner back in a month.     Ranelle Oyster, M.D. Electronically Signed    ZTS/MedQ D:  08/07/2011 14:00:46  T:  08/07/2011 15:24:22  Job #:  [id number]

## 2011-09-07 ENCOUNTER — Encounter: Payer: 59 | Attending: Neurosurgery | Admitting: Neurosurgery

## 2011-09-07 DIAGNOSIS — M545 Low back pain, unspecified: Secondary | ICD-10-CM

## 2011-09-07 DIAGNOSIS — G894 Chronic pain syndrome: Secondary | ICD-10-CM

## 2011-09-07 DIAGNOSIS — G43909 Migraine, unspecified, not intractable, without status migrainosus: Secondary | ICD-10-CM | POA: Insufficient documentation

## 2011-09-07 DIAGNOSIS — M51379 Other intervertebral disc degeneration, lumbosacral region without mention of lumbar back pain or lower extremity pain: Secondary | ICD-10-CM | POA: Insufficient documentation

## 2011-09-07 DIAGNOSIS — IMO0001 Reserved for inherently not codable concepts without codable children: Secondary | ICD-10-CM

## 2011-09-07 DIAGNOSIS — Z87442 Personal history of urinary calculi: Secondary | ICD-10-CM | POA: Insufficient documentation

## 2011-09-07 DIAGNOSIS — M5137 Other intervertebral disc degeneration, lumbosacral region: Secondary | ICD-10-CM | POA: Insufficient documentation

## 2011-09-07 DIAGNOSIS — M7520 Bicipital tendinitis, unspecified shoulder: Secondary | ICD-10-CM | POA: Insufficient documentation

## 2011-09-08 NOTE — Assessment & Plan Note (Signed)
The patient of [name] seen for multiple pain complaints.  She reports no change in her pain.  She did not rate it today.  Her general activity level is 10.  Pain is worse during the morning and at night. All activities  aggravate, rest, heat, therapy, medication injections help.  She states she is contemplating a lithotripsy versus open procedure with her new urologist for kidney stones.  Mobility she walks with and without assistance.  She can climb steps and drives and walk about 10 minutes at a time.  She needs help with household duties.  She does not work she has bowel.  REVIEW OF SYSTEMS:  Notable for difficulties described above as well as bowel and bladder issues, paresthesias, trouble walking, spasm depression, anxiety, fever, chills, weight fluctuations, GI issues and limb swelling.  PAST MEDICAL HISTORY:  Unchanged.  SOCIAL HISTORY:  Unchanged.  FAMILY HISTORY:  Unchanged.  PHYSICAL EXAM:  Blood pressure is 141/75, pulse 104, respirations 16, O2 saturations 96 on room air.  Motor strength and sensation is intact. Constitutionally, she is within normal limits.  She is oriented x3. Again somewhat muddled in her thoughts and conversation is hard to track.  ASSESSMENT: 1. Fibromyalgia. 2. History of left bicipital tendinitis. 3. Migraines. 4. Lumbar degenerative disk disease. 5. History of kidney stones.  PLAN: 1. Refill Norco 5/325 1 p.o. q.6 h. p.r.n. 90 with no refill. 2. Fentanyl 10 mcg 1 transdermally every 72 hours 10 with no refills.     Questions were encouraged and answered.  We will see her in a     month.     Patty Salas Electronically Signed    RLW/MedQ D:  09/07/2011 14:03:40  T:  09/08/2011 01:52:00  Job #:  [id number]

## 2011-10-09 ENCOUNTER — Ambulatory Visit: Payer: 59 | Admitting: Neurosurgery

## 2011-10-13 ENCOUNTER — Encounter: Payer: 59 | Admitting: Physical Medicine & Rehabilitation

## 2011-11-08 ENCOUNTER — Encounter: Payer: 59 | Attending: Physical Medicine & Rehabilitation | Admitting: Physical Medicine & Rehabilitation

## 2011-11-08 DIAGNOSIS — IMO0002 Reserved for concepts with insufficient information to code with codable children: Secondary | ICD-10-CM

## 2011-11-08 DIAGNOSIS — IMO0001 Reserved for inherently not codable concepts without codable children: Secondary | ICD-10-CM | POA: Insufficient documentation

## 2011-11-08 DIAGNOSIS — M5137 Other intervertebral disc degeneration, lumbosacral region: Secondary | ICD-10-CM

## 2011-11-08 DIAGNOSIS — G43909 Migraine, unspecified, not intractable, without status migrainosus: Secondary | ICD-10-CM | POA: Insufficient documentation

## 2011-11-08 DIAGNOSIS — M51379 Other intervertebral disc degeneration, lumbosacral region without mention of lumbar back pain or lower extremity pain: Secondary | ICD-10-CM | POA: Insufficient documentation

## 2011-11-08 DIAGNOSIS — Z87442 Personal history of urinary calculi: Secondary | ICD-10-CM | POA: Insufficient documentation

## 2011-11-08 DIAGNOSIS — F341 Dysthymic disorder: Secondary | ICD-10-CM

## 2011-11-08 NOTE — Assessment & Plan Note (Signed)
Patty Salas is back regarding her general pain complaints.  She states she has had some low back pain and questions whether it maybe related to some of her bladder and kidney stone issues.  She has had kidney stones removed in the past apparently.  She sees Dr. Mena Goes in this regard. She has had some results with epidural steroid injections and he has done multiple trigger points which she finds helpful.  She tries to stay active with exercise and appropriate diet.  She continues to have lot of stresses at home.  REVIEW OF SYSTEMS:  Notable for multiple issues.  Full 12-point review is in the written health and history section of the chart.  SOCIAL HISTORY:  Unchanged.  She continues to have a rocky relationship with her husband.  PHYSICAL EXAMINATION:  VITAL SIGNS:  Blood pressure is 132/66, pulse 110, respiratory rate 18 and she is satting 97% on room air. GENERAL:  The patient is generally pleasant and alert.  She is anxious as usual. BACK:  On examining her back today, she has some elevation of the right hemipelvis over the left which tends to move her spine a bit leftward in the lumbar region.  She tends to have the right curve in the upper thoracic area.  She had tender areas with muscle spasm and myofascial trigger points in the left lumbar area, bilateral trapezius and left cervical paraspinals. NEUROLOGIC:  Cognitively, she is at baseline.  ASSESSMENT: 1. History of fibromyalgia with multiple trigger points. 2. Migraine headaches. 3. Lumbar degenerative disk disease. 4. History of nephrolithiasis.  PLAN: 1. After informed consent, I injected 4 trigger points in the left     cervical paraspinals, lateral trapezius areas and left lumbar     paraspinals.  We used 2 mL of 1% lidocaine at each area.  The     patient tolerated well. 2. I will send her for x-rays of the lumbar spine to rule out occult     injury and progression of her degenerative disease.  Also, may  capture kidney stones as well. 3. I refilled her Norco 5/325, #90 and fentanyl 25 mcg, #10 which are     to be filled later this month. 4. She will see my nurse back in a month and me in 3 months.     Ranelle Oyster, M.D. Electronically Signed    ZTS/MedQ D:  11/08/2011 14:55:26  T:  11/08/2011 16:10:96  Job #:  [id number]

## 2011-11-17 ENCOUNTER — Ambulatory Visit (HOSPITAL_COMMUNITY)
Admission: RE | Admit: 2011-11-17 | Discharge: 2011-11-17 | Disposition: A | Discharge status: Home / Self Care | Payer: 59 | Source: Ambulatory Visit | Attending: Physical Medicine & Rehabilitation | Admitting: Physical Medicine & Rehabilitation

## 2011-11-17 ENCOUNTER — Other Ambulatory Visit: Payer: Self-pay | Admitting: Physical Medicine & Rehabilitation

## 2011-11-17 DIAGNOSIS — M545 Low back pain, unspecified: Secondary | ICD-10-CM

## 2011-11-17 DIAGNOSIS — M51379 Other intervertebral disc degeneration, lumbosacral region without mention of lumbar back pain or lower extremity pain: Secondary | ICD-10-CM | POA: Insufficient documentation

## 2011-11-17 DIAGNOSIS — M5137 Other intervertebral disc degeneration, lumbosacral region: Secondary | ICD-10-CM | POA: Insufficient documentation

## 2011-11-17 DIAGNOSIS — N200 Calculus of kidney: Secondary | ICD-10-CM | POA: Insufficient documentation

## 2011-11-17 DIAGNOSIS — M5126 Other intervertebral disc displacement, lumbar region: Secondary | ICD-10-CM | POA: Insufficient documentation

## 2011-11-20 ENCOUNTER — Other Ambulatory Visit: Payer: Self-pay | Admitting: Family Medicine

## 2011-11-20 DIAGNOSIS — Z1231 Encounter for screening mammogram for malignant neoplasm of breast: Secondary | ICD-10-CM

## 2011-11-28 ENCOUNTER — Ambulatory Visit: Payer: 59

## 2011-12-05 ENCOUNTER — Ambulatory Visit: Payer: 59

## 2011-12-06 ENCOUNTER — Encounter: Payer: 59 | Attending: Neurosurgery | Admitting: Physical Medicine & Rehabilitation

## 2011-12-06 ENCOUNTER — Encounter: Payer: Self-pay | Admitting: Physical Medicine & Rehabilitation

## 2011-12-06 ENCOUNTER — Ambulatory Visit: Payer: 59 | Admitting: Physical Medicine & Rehabilitation

## 2011-12-06 VITALS — BP 161/80 | HR 124 | Resp 16 | Ht 65.5 in | Wt 141.0 lb

## 2011-12-06 DIAGNOSIS — F418 Other specified anxiety disorders: Secondary | ICD-10-CM | POA: Insufficient documentation

## 2011-12-06 DIAGNOSIS — M47817 Spondylosis without myelopathy or radiculopathy, lumbosacral region: Secondary | ICD-10-CM | POA: Insufficient documentation

## 2011-12-06 DIAGNOSIS — M797 Fibromyalgia: Secondary | ICD-10-CM | POA: Insufficient documentation

## 2011-12-06 DIAGNOSIS — M5136 Other intervertebral disc degeneration, lumbar region: Secondary | ICD-10-CM

## 2011-12-06 DIAGNOSIS — F32A Depression, unspecified: Secondary | ICD-10-CM

## 2011-12-06 DIAGNOSIS — M5137 Other intervertebral disc degeneration, lumbosacral region: Secondary | ICD-10-CM

## 2011-12-06 DIAGNOSIS — F329 Major depressive disorder, single episode, unspecified: Secondary | ICD-10-CM

## 2011-12-06 DIAGNOSIS — M51379 Other intervertebral disc degeneration, lumbosacral region without mention of lumbar back pain or lower extremity pain: Secondary | ICD-10-CM | POA: Insufficient documentation

## 2011-12-06 DIAGNOSIS — IMO0001 Reserved for inherently not codable concepts without codable children: Secondary | ICD-10-CM | POA: Insufficient documentation

## 2011-12-06 DIAGNOSIS — G43519 Persistent migraine aura without cerebral infarction, intractable, without status migrainosus: Secondary | ICD-10-CM | POA: Insufficient documentation

## 2011-12-06 DIAGNOSIS — M47816 Spondylosis without myelopathy or radiculopathy, lumbar region: Secondary | ICD-10-CM

## 2011-12-06 MED ORDER — FENTANYL 25 MCG/HR TD PT72: 1.0000 | MEDICATED_PATCH | TRANSDERMAL | Status: DC

## 2011-12-06 MED ORDER — HYDROCODONE-ACETAMINOPHEN 5-325 MG PO TABS: 1.0000 | ORAL_TABLET | Freq: Four times a day (QID) | ORAL | Status: DC | PRN
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
## 2011-12-06 NOTE — Patient Instructions (Signed)

## 2011-12-07 ENCOUNTER — Telehealth: Payer: Self-pay | Admitting: Physical Medicine & Rehabilitation

## 2011-12-07 NOTE — Telephone Encounter (Signed)
Patient needs to know what an "mm digital screening" is.  It is on her exercise to do list she got yesterday.

## 2011-12-08 NOTE — Telephone Encounter (Signed)
I spoke and she is aware of what this is.

## 2011-12-11 ENCOUNTER — Telehealth: Payer: Self-pay | Admitting: Physical Medicine & Rehabilitation

## 2011-12-11 NOTE — Telephone Encounter (Signed)
Could not understand the last name of the counselor

## 2011-12-11 NOTE — Telephone Encounter (Signed)
Patient going to see counelor, Dr. Dedra Skeens ..........  at [facility] to discuss what she discussed with you on 12/06/11.  She will have this counselor contact you.  Also, having lab work with Dr Romeo Apple, Vit D and Potassium.

## 2011-12-11 NOTE — Telephone Encounter (Signed)
Noted  

## 2011-12-14 ENCOUNTER — Telehealth: Payer: Self-pay | Admitting: Physical Medicine & Rehabilitation

## 2011-12-14 NOTE — Telephone Encounter (Signed)
FYI - had lab work on 12/12/11.  Vit D level was 15.2, Potassium was 3.2.  Her Dr will be calling meds in for both.

## 2011-12-14 NOTE — Telephone Encounter (Signed)
Noted  

## 2011-12-18 ENCOUNTER — Telehealth: Payer: Self-pay | Admitting: Physical Medicine & Rehabilitation

## 2011-12-18 ENCOUNTER — Encounter: Payer: Self-pay | Admitting: Physical Medicine & Rehabilitation

## 2011-12-18 NOTE — Telephone Encounter (Signed)
Did Dr get email from [place]?  Would like to have trigger points instead of RN visit.

## 2011-12-19 ENCOUNTER — Encounter: Payer: Self-pay | Admitting: Physical Medicine & Rehabilitation

## 2011-12-19 NOTE — Telephone Encounter (Signed)
Noted  

## 2011-12-19 NOTE — Telephone Encounter (Signed)
i spoke to gwen yesterday.  We are on the same page.

## 2011-12-22 ENCOUNTER — Telehealth: Payer: Self-pay | Admitting: Physical Medicine & Rehabilitation

## 2011-12-22 NOTE — Telephone Encounter (Signed)
Pt aware that we can refill her medication before her appointment. I asked her to call us a few days before she will be out and we can refill it for her.

## 2011-12-22 NOTE — Telephone Encounter (Signed)
Patient will be out of her Norco on the 5th.  Her appoint ment is on the 10th.

## 2012-01-02 ENCOUNTER — Encounter: Payer: Self-pay | Admitting: Physical Medicine & Rehabilitation

## 2012-01-02 ENCOUNTER — Telehealth: Payer: Self-pay | Admitting: *Deleted

## 2012-01-02 DIAGNOSIS — G43519 Persistent migraine aura without cerebral infarction, intractable, without status migrainosus: Secondary | ICD-10-CM

## 2012-01-02 DIAGNOSIS — M5136 Other intervertebral disc degeneration, lumbar region: Secondary | ICD-10-CM

## 2012-01-02 DIAGNOSIS — M797 Fibromyalgia: Secondary | ICD-10-CM

## 2012-01-02 DIAGNOSIS — M47816 Spondylosis without myelopathy or radiculopathy, lumbar region: Secondary | ICD-10-CM

## 2012-01-02 MED ORDER — HYDROCODONE-ACETAMINOPHEN 5-325 MG PO TABS: 1.0000 | ORAL_TABLET | Freq: Four times a day (QID) | ORAL | Status: DC | PRN

## 2012-01-02 NOTE — Telephone Encounter (Signed)
Notified Gusta that medication refilled

## 2012-01-02 NOTE — Telephone Encounter (Signed)
Refill Norco 5/325. Has appt 01/10/12 with Riley Kill.    (Norco 5/325 1 q 6 hr # 90 last fill 12/06/11)

## 2012-01-10 ENCOUNTER — Encounter: Payer: Self-pay | Admitting: Physical Medicine & Rehabilitation

## 2012-01-10 ENCOUNTER — Encounter: Payer: 59 | Attending: Neurosurgery | Admitting: Physical Medicine & Rehabilitation

## 2012-01-10 VITALS — BP 120/66 | HR 95 | Resp 16 | Ht 65.0 in | Wt 141.2 lb

## 2012-01-10 DIAGNOSIS — M51379 Other intervertebral disc degeneration, lumbosacral region without mention of lumbar back pain or lower extremity pain: Secondary | ICD-10-CM | POA: Insufficient documentation

## 2012-01-10 DIAGNOSIS — M5137 Other intervertebral disc degeneration, lumbosacral region: Secondary | ICD-10-CM

## 2012-01-10 DIAGNOSIS — M797 Fibromyalgia: Secondary | ICD-10-CM

## 2012-01-10 DIAGNOSIS — IMO0001 Reserved for inherently not codable concepts without codable children: Secondary | ICD-10-CM

## 2012-01-10 DIAGNOSIS — G43519 Persistent migraine aura without cerebral infarction, intractable, without status migrainosus: Secondary | ICD-10-CM | POA: Insufficient documentation

## 2012-01-10 DIAGNOSIS — M5136 Other intervertebral disc degeneration, lumbar region: Secondary | ICD-10-CM

## 2012-01-10 DIAGNOSIS — M47816 Spondylosis without myelopathy or radiculopathy, lumbar region: Secondary | ICD-10-CM

## 2012-01-10 DIAGNOSIS — M47817 Spondylosis without myelopathy or radiculopathy, lumbosacral region: Secondary | ICD-10-CM | POA: Insufficient documentation

## 2012-01-10 DIAGNOSIS — M778 Other enthesopathies, not elsewhere classified: Secondary | ICD-10-CM

## 2012-01-10 MED ORDER — HYDROCODONE-ACETAMINOPHEN 5-325 MG PO TABS: 1.0000 | ORAL_TABLET | Freq: Four times a day (QID) | ORAL | Status: DC | PRN

## 2012-01-10 MED ORDER — FENTANYL 25 MCG/HR TD PT72: 1.0000 | MEDICATED_PATCH | TRANSDERMAL | Status: DC

## 2012-01-10 NOTE — Patient Instructions (Signed)
The best way to physical health and better control of your pain is to manage your psychological health!!!!
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
## 2012-01-11 ENCOUNTER — Encounter: Payer: Self-pay | Admitting: Physical Medicine & Rehabilitation

## 2012-02-15 ENCOUNTER — Encounter: Payer: 59 | Attending: Physical Medicine & Rehabilitation | Admitting: *Deleted

## 2012-02-15 ENCOUNTER — Encounter: Payer: Self-pay | Admitting: *Deleted

## 2012-02-15 VITALS — BP 136/50 | HR 110 | Resp 16 | Ht 66.0 in | Wt 138.0 lb

## 2012-02-15 DIAGNOSIS — Z87442 Personal history of urinary calculi: Secondary | ICD-10-CM | POA: Insufficient documentation

## 2012-02-15 DIAGNOSIS — G5600 Carpal tunnel syndrome, unspecified upper limb: Secondary | ICD-10-CM | POA: Insufficient documentation

## 2012-02-15 DIAGNOSIS — M5137 Other intervertebral disc degeneration, lumbosacral region: Secondary | ICD-10-CM

## 2012-02-15 DIAGNOSIS — M797 Fibromyalgia: Secondary | ICD-10-CM

## 2012-02-15 DIAGNOSIS — M5136 Other intervertebral disc degeneration, lumbar region: Secondary | ICD-10-CM

## 2012-02-15 DIAGNOSIS — M47816 Spondylosis without myelopathy or radiculopathy, lumbar region: Secondary | ICD-10-CM

## 2012-02-15 DIAGNOSIS — M778 Other enthesopathies, not elsewhere classified: Secondary | ICD-10-CM

## 2012-02-15 DIAGNOSIS — M47817 Spondylosis without myelopathy or radiculopathy, lumbosacral region: Secondary | ICD-10-CM

## 2012-02-15 DIAGNOSIS — G43519 Persistent migraine aura without cerebral infarction, intractable, without status migrainosus: Secondary | ICD-10-CM

## 2012-02-15 DIAGNOSIS — IMO0001 Reserved for inherently not codable concepts without codable children: Secondary | ICD-10-CM

## 2012-02-15 DIAGNOSIS — M51379 Other intervertebral disc degeneration, lumbosacral region without mention of lumbar back pain or lower extremity pain: Secondary | ICD-10-CM | POA: Insufficient documentation

## 2012-02-15 DIAGNOSIS — M65839 Other synovitis and tenosynovitis, unspecified forearm: Secondary | ICD-10-CM | POA: Insufficient documentation

## 2012-02-15 DIAGNOSIS — M542 Cervicalgia: Secondary | ICD-10-CM

## 2012-02-15 DIAGNOSIS — M65849 Other synovitis and tenosynovitis, unspecified hand: Secondary | ICD-10-CM | POA: Insufficient documentation

## 2012-02-15 DIAGNOSIS — G43901 Migraine, unspecified, not intractable, with status migrainosus: Secondary | ICD-10-CM | POA: Insufficient documentation

## 2012-02-15 MED ORDER — HYDROCODONE-ACETAMINOPHEN 5-325 MG PO TABS: 1.0000 | ORAL_TABLET | Freq: Four times a day (QID) | ORAL | Status: DC | PRN

## 2012-02-15 MED ORDER — FENTANYL 25 MCG/HR TD PT72: 1.0000 | MEDICATED_PATCH | TRANSDERMAL | Status: DC

## 2012-02-15 NOTE — Progress Notes (Signed)
Pt came in today with a migraine. She is talkative as normal and very anxious. Experiencing tremors today.

## 2012-03-01 ENCOUNTER — Ambulatory Visit: Payer: 59

## 2012-03-20 ENCOUNTER — Encounter: Payer: Self-pay | Admitting: Physical Medicine & Rehabilitation

## 2012-03-20 ENCOUNTER — Encounter: Payer: 59 | Attending: Neurosurgery | Admitting: Physical Medicine & Rehabilitation

## 2012-03-20 VITALS — BP 124/46 | HR 107 | Resp 14 | Ht 66.0 in | Wt 143.0 lb

## 2012-03-20 DIAGNOSIS — G43519 Persistent migraine aura without cerebral infarction, intractable, without status migrainosus: Secondary | ICD-10-CM | POA: Insufficient documentation

## 2012-03-20 DIAGNOSIS — F418 Other specified anxiety disorders: Secondary | ICD-10-CM

## 2012-03-20 DIAGNOSIS — M5136 Other intervertebral disc degeneration, lumbar region: Secondary | ICD-10-CM

## 2012-03-20 DIAGNOSIS — F341 Dysthymic disorder: Secondary | ICD-10-CM

## 2012-03-20 DIAGNOSIS — M51379 Other intervertebral disc degeneration, lumbosacral region without mention of lumbar back pain or lower extremity pain: Secondary | ICD-10-CM | POA: Insufficient documentation

## 2012-03-20 DIAGNOSIS — M5137 Other intervertebral disc degeneration, lumbosacral region: Secondary | ICD-10-CM

## 2012-03-20 DIAGNOSIS — M778 Other enthesopathies, not elsewhere classified: Secondary | ICD-10-CM | POA: Insufficient documentation

## 2012-03-20 DIAGNOSIS — M797 Fibromyalgia: Secondary | ICD-10-CM

## 2012-03-20 DIAGNOSIS — M47817 Spondylosis without myelopathy or radiculopathy, lumbosacral region: Secondary | ICD-10-CM | POA: Insufficient documentation

## 2012-03-20 DIAGNOSIS — M47816 Spondylosis without myelopathy or radiculopathy, lumbar region: Secondary | ICD-10-CM

## 2012-03-20 DIAGNOSIS — IMO0001 Reserved for inherently not codable concepts without codable children: Secondary | ICD-10-CM

## 2012-03-20 MED ORDER — HYDROCODONE-ACETAMINOPHEN 5-325 MG PO TABS: 1.0000 | ORAL_TABLET | Freq: Four times a day (QID) | ORAL | Status: DC | PRN

## 2012-03-20 MED ORDER — FENTANYL 25 MCG/HR TD PT72: 1.0000 | MEDICATED_PATCH | TRANSDERMAL | Status: DC

## 2012-03-20 NOTE — Patient Instructions (Signed)
Continue with your exercises. Focus on good sleep and mental health.  Know when to "step away" from stressful/trying situations.
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
## 2012-04-10 ENCOUNTER — Encounter: Payer: Self-pay | Admitting: Physical Medicine and Rehabilitation

## 2012-04-10 ENCOUNTER — Encounter: Payer: 59 | Attending: Physical Medicine & Rehabilitation | Admitting: Physical Medicine and Rehabilitation

## 2012-04-10 VITALS — BP 106/67 | HR 109 | Resp 16 | Ht 66.0 in | Wt 142.8 lb

## 2012-04-10 DIAGNOSIS — G43909 Migraine, unspecified, not intractable, without status migrainosus: Secondary | ICD-10-CM | POA: Insufficient documentation

## 2012-04-10 DIAGNOSIS — G43519 Persistent migraine aura without cerebral infarction, intractable, without status migrainosus: Secondary | ICD-10-CM

## 2012-04-10 DIAGNOSIS — M47816 Spondylosis without myelopathy or radiculopathy, lumbar region: Secondary | ICD-10-CM

## 2012-04-10 DIAGNOSIS — M47817 Spondylosis without myelopathy or radiculopathy, lumbosacral region: Secondary | ICD-10-CM

## 2012-04-10 DIAGNOSIS — M778 Other enthesopathies, not elsewhere classified: Secondary | ICD-10-CM

## 2012-04-10 DIAGNOSIS — M65839 Other synovitis and tenosynovitis, unspecified forearm: Secondary | ICD-10-CM | POA: Insufficient documentation

## 2012-04-10 DIAGNOSIS — M542 Cervicalgia: Secondary | ICD-10-CM | POA: Insufficient documentation

## 2012-04-10 DIAGNOSIS — M797 Fibromyalgia: Secondary | ICD-10-CM

## 2012-04-10 DIAGNOSIS — M5136 Other intervertebral disc degeneration, lumbar region: Secondary | ICD-10-CM

## 2012-04-10 DIAGNOSIS — M25539 Pain in unspecified wrist: Secondary | ICD-10-CM | POA: Insufficient documentation

## 2012-04-10 DIAGNOSIS — G8929 Other chronic pain: Secondary | ICD-10-CM

## 2012-04-10 DIAGNOSIS — M51379 Other intervertebral disc degeneration, lumbosacral region without mention of lumbar back pain or lower extremity pain: Secondary | ICD-10-CM

## 2012-04-10 DIAGNOSIS — IMO0001 Reserved for inherently not codable concepts without codable children: Secondary | ICD-10-CM

## 2012-04-10 DIAGNOSIS — Z87442 Personal history of urinary calculi: Secondary | ICD-10-CM | POA: Insufficient documentation

## 2012-04-10 DIAGNOSIS — G5600 Carpal tunnel syndrome, unspecified upper limb: Secondary | ICD-10-CM | POA: Insufficient documentation

## 2012-04-10 DIAGNOSIS — M5137 Other intervertebral disc degeneration, lumbosacral region: Secondary | ICD-10-CM

## 2012-04-10 MED ORDER — FENTANYL 25 MCG/HR TD PT72: 1.0000 | MEDICATED_PATCH | TRANSDERMAL | Status: DC

## 2012-04-10 MED ORDER — HYDROCODONE-ACETAMINOPHEN 5-325 MG PO TABS: 1.0000 | ORAL_TABLET | Freq: Four times a day (QID) | ORAL | Status: DC | PRN

## 2012-04-10 MED ORDER — DICLOFENAC SODIUM 1 % TD GEL: 1.0000 "application " | Freq: Four times a day (QID) | TRANSDERMAL | Status: DC

## 2012-04-10 NOTE — Progress Notes (Signed)
Subjective:    Patient ID: Patty Salas, female    DOB: 11-25-1955, 56 y.o.   MRN: [id number]  HPI The patient complains chronic pain syndrome. The patient also complains about right wrist pain, and neck pain.  The problem has been stable.  Pain Inventory Average Pain 9 Pain Right Now 9 My pain is constant, sharp and stabbing  In the last 24 hours, has pain interfered with the following? General activity 8 Relation with others 8 Enjoyment of life 8 What TIME of day is your pain at its worst? all of the time Sleep (in general) Fair  Pain is worse with: walking, bending, sitting, inactivity, standing and some activites Pain improves with: rest, heat/ice, therapy/exercise, medication and injections Relief from Meds: 5  Mobility walk without assistance use a cane how many minutes can you walk? 10 ability to climb steps?  yes do you drive?  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  no  Physicians involved in your care Any changes since last visit?  no   Family History  Problem Relation Age of Onset  . Cancer Mother   . Heart disease Father   . Hypertension Father   . Hypertension Brother   . Hypertension Brother    History   Social History  . Marital Status: Married    Spouse Name: N/A    Number of Children: N/A  . Years of Education: N/A   Social History Main Topics  . Smoking status: Never Smoker   . Smokeless tobacco: Never Used  . Alcohol Use: None  . Drug Use: None  . Sexually Active: None   Other Topics Concern  . None   Social History Narrative  . None   Past Surgical History  Procedure Date  . Abdominal hysterectomy   . Appendectomy   . Cholecystectomy   . Tonsillectomy   . Neck surgery    Past Medical History  Diagnosis Date  .  Myalgia and myositis, unspecified   . Fibromyalgia   . Anxiety   . Depression   . Cervical facet syndrome   . Calcifying tendinitis of shoulder   . Carpal tunnel syndrome   . Thoracic radiculopathy    BP 106/67  Pulse 109  Resp 16  Ht 5\' 6"  (1.676 m)  Wt 142 lb 12.8 oz (64.774 kg)  BMI 23.05 kg/m2  SpO2 95%    Review of Systems  Constitutional: Positive for chills, appetite change and unexpected weight change.  Gastrointestinal: Positive for nausea, vomiting, abdominal pain, diarrhea and constipation.  Musculoskeletal: Positive for myalgias, back pain, arthralgias and gait problem.  Skin: Positive for rash.  Neurological: Positive for dizziness, tremors and numbness.  Psychiatric/Behavioral: Positive for confusion and dysphoric mood. The patient is nervous/anxious.   All other systems reviewed and are negative.       Objective:   Physical Exam Symmetric normal motor tone is noted throughout. Normal muscle bulk. Muscle testing reveals 5/5 muscle strength of the upper extremity, and 5/5 of the lower extremity. Full range of motion in upper and lower extremities. ROM of spine is not restricted. Fine motor movements are normal in both hands. Sensory is intact and symmetric to light touch, pinprick and proprioception. DTR in the upper and lower extremity are present and symmetric 2+. No clonus is noted.  Patient arises from chair without difficulty.  Narrow based gait with normal arm swing bilateral , able to walk on heels, toe walk not possible, because of gout, per patient  . Tandem walk is stable. No pronator drift. Rhomberg negative.        Assessment & Plan:  1. History of fibromyalgia with myofascial pain and multiple trigger points.  2. Migraine headaches.  3. Lumbar degenerative disk disease.  4. History of nephrolithiasis.  5. Right flexor pollicis longus tendonitis  6. Right CTS  PLAN Prescribed Voltaren gel for right wrist pain and tendon pain 1. Advised  daytime use of CTS splint if sx dictate.  2. Continue back exercises. I provided her with back exercises today. I don't think that she needs an ESI at this point. I think she's on the right track with exercise, posture, etc.  3. I refilled her Norco 5/325, #90 and fentanyl 25 mcg, #10. The long term goal again is to taper the narcs.  4. She will see my PA in about one month.

## 2012-04-10 NOTE — Patient Instructions (Addendum)
Continue with exercises, continue walking. Try to join a gym, where you can work out in a pool.

## 2012-04-18 ENCOUNTER — Ambulatory Visit: Payer: 59 | Admitting: Physical Medicine and Rehabilitation

## 2012-04-18 ENCOUNTER — Telehealth: Payer: Self-pay | Admitting: Physical Medicine & Rehabilitation

## 2012-04-18 MED ORDER — METHYLPREDNISOLONE 4 MG PO KIT: PACK | ORAL | Status: AC

## 2012-04-18 NOTE — Telephone Encounter (Signed)
Pt can tolerate, rx has been sent in.

## 2012-04-18 NOTE — Telephone Encounter (Signed)
Please advise 

## 2012-04-18 NOTE — Telephone Encounter (Signed)
Could call it in, ask patient whether she had tolerated one in the past, the patient has a lot of allergies !

## 2012-04-18 NOTE — Telephone Encounter (Signed)
Reaction to Voltaren gel.  Allergic to NSAIDS.  Can Patty Salas call in Medrol dose pak?

## 2012-05-09 ENCOUNTER — Telehealth: Payer: Self-pay | Admitting: Physical Medicine & Rehabilitation

## 2012-05-09 ENCOUNTER — Encounter: Payer: 59 | Admitting: Physical Medicine and Rehabilitation

## 2012-05-09 NOTE — Telephone Encounter (Signed)
Call noted. No appointment available before meds run out (rescheduled to 05/29/12)and Destanee knows to call when she needs her medication.

## 2012-05-09 NOTE — Telephone Encounter (Signed)
Cx'd appt today due to migraine.  Has enough Norco for 14days.  Has 6 Fentanyl.

## 2012-05-15 ENCOUNTER — Other Ambulatory Visit: Payer: Self-pay

## 2012-05-15 DIAGNOSIS — G43519 Persistent migraine aura without cerebral infarction, intractable, without status migrainosus: Secondary | ICD-10-CM

## 2012-05-15 DIAGNOSIS — M797 Fibromyalgia: Secondary | ICD-10-CM

## 2012-05-15 DIAGNOSIS — M47816 Spondylosis without myelopathy or radiculopathy, lumbar region: Secondary | ICD-10-CM

## 2012-05-15 DIAGNOSIS — M778 Other enthesopathies, not elsewhere classified: Secondary | ICD-10-CM

## 2012-05-15 DIAGNOSIS — M5136 Other intervertebral disc degeneration, lumbar region: Secondary | ICD-10-CM

## 2012-05-15 MED ORDER — HYDROCODONE-ACETAMINOPHEN 5-325 MG PO TABS: 1.0000 | ORAL_TABLET | Freq: Four times a day (QID) | ORAL | Status: DC | PRN

## 2012-05-29 ENCOUNTER — Other Ambulatory Visit: Payer: Self-pay | Admitting: Physical Medicine and Rehabilitation

## 2012-05-29 ENCOUNTER — Encounter: Payer: 59 | Attending: Physical Medicine and Rehabilitation | Admitting: Physical Medicine and Rehabilitation

## 2012-05-29 ENCOUNTER — Encounter: Payer: Self-pay | Admitting: Physical Medicine and Rehabilitation

## 2012-05-29 DIAGNOSIS — M5137 Other intervertebral disc degeneration, lumbosacral region: Secondary | ICD-10-CM | POA: Insufficient documentation

## 2012-05-29 DIAGNOSIS — IMO0001 Reserved for inherently not codable concepts without codable children: Secondary | ICD-10-CM | POA: Insufficient documentation

## 2012-05-29 DIAGNOSIS — M51379 Other intervertebral disc degeneration, lumbosacral region without mention of lumbar back pain or lower extremity pain: Secondary | ICD-10-CM | POA: Insufficient documentation

## 2012-05-29 DIAGNOSIS — G8929 Other chronic pain: Secondary | ICD-10-CM | POA: Insufficient documentation

## 2012-05-29 DIAGNOSIS — M545 Low back pain, unspecified: Secondary | ICD-10-CM | POA: Insufficient documentation

## 2012-05-29 DIAGNOSIS — M47817 Spondylosis without myelopathy or radiculopathy, lumbosacral region: Secondary | ICD-10-CM

## 2012-05-29 DIAGNOSIS — M542 Cervicalgia: Secondary | ICD-10-CM | POA: Insufficient documentation

## 2012-05-29 DIAGNOSIS — Z5181 Encounter for therapeutic drug level monitoring: Secondary | ICD-10-CM

## 2012-05-29 DIAGNOSIS — M47816 Spondylosis without myelopathy or radiculopathy, lumbar region: Secondary | ICD-10-CM

## 2012-05-29 DIAGNOSIS — M4310 Spondylolisthesis, site unspecified: Secondary | ICD-10-CM | POA: Insufficient documentation

## 2012-05-29 DIAGNOSIS — Z87442 Personal history of urinary calculi: Secondary | ICD-10-CM | POA: Insufficient documentation

## 2012-05-29 DIAGNOSIS — Q762 Congenital spondylolisthesis: Secondary | ICD-10-CM

## 2012-05-29 DIAGNOSIS — M797 Fibromyalgia: Secondary | ICD-10-CM

## 2012-05-29 DIAGNOSIS — G5600 Carpal tunnel syndrome, unspecified upper limb: Secondary | ICD-10-CM | POA: Insufficient documentation

## 2012-05-29 DIAGNOSIS — M6580 Other synovitis and tenosynovitis, unspecified site: Secondary | ICD-10-CM | POA: Insufficient documentation

## 2012-05-29 DIAGNOSIS — M5136 Other intervertebral disc degeneration, lumbar region: Secondary | ICD-10-CM

## 2012-05-29 DIAGNOSIS — M25539 Pain in unspecified wrist: Secondary | ICD-10-CM | POA: Insufficient documentation

## 2012-05-29 DIAGNOSIS — G43909 Migraine, unspecified, not intractable, without status migrainosus: Secondary | ICD-10-CM | POA: Insufficient documentation

## 2012-05-29 DIAGNOSIS — G43519 Persistent migraine aura without cerebral infarction, intractable, without status migrainosus: Secondary | ICD-10-CM

## 2012-05-29 DIAGNOSIS — M778 Other enthesopathies, not elsewhere classified: Secondary | ICD-10-CM

## 2012-05-29 MED ORDER — FENTANYL 25 MCG/HR TD PT72: 1.0000 | MEDICATED_PATCH | TRANSDERMAL | Status: DC

## 2012-05-29 NOTE — Patient Instructions (Addendum)
Advised patient to continue with walking program , look into exercising in the pool.Follow up with your counselor regulary.

## 2012-05-29 NOTE — Progress Notes (Signed)
Subjective:    Patient ID: Patty Salas, female    DOB: 17-Nov-1955, 56 y.o.   MRN: [id number]  HPI The patient complains about chronic low back pain. The patient also complains about right wrist pain, and neck pain.  The problem has been stable.   Pain Inventory Average Pain 10 Pain Right Now 10 My pain is constant, sharp and stabbing  In the last 24 hours, has pain interfered with the following? General activity 10 Relation with others 10 Enjoyment of life 10 What TIME of day is your pain at its worst? constant Sleep (in general) Fair  Pain is worse with: walking, bending, sitting, inactivity, standing and some activites Pain improves with: rest, heat/ice, medication and injections Relief from Meds: 6  Mobility walk without assistance walk with assistance use a cane use a walker how many minutes can you walk? 10 ability to climb steps?  yes do you drive?  yes needs help with transfers transfers alone Do you have any goals in this area?  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  no  Physicians involved in your care Any changes since last visit?  no   Family History  Problem Relation Age of Onset  . Cancer Mother   . Heart disease Father   . Hypertension Father   . Hypertension Brother   . Hypertension Brother    History   Social History  . Marital Status: Married    Spouse Name: N/A    Number of Children: N/A  . Years of Education: N/A   Social History Main Topics  . Smoking status: Never Smoker   . Smokeless tobacco: Never Used  . Alcohol Use: None  . Drug Use: None  . Sexually Active: None   Other Topics Concern  . None   Social History Narrative  . None    Past Surgical History  Procedure Date  . Abdominal hysterectomy   . Appendectomy   . Cholecystectomy   . Tonsillectomy   . Neck surgery    Past Medical History  Diagnosis Date  . Myalgia and myositis, unspecified   . Fibromyalgia   . Anxiety   . Depression   . Cervical facet syndrome   . Calcifying tendinitis of shoulder   . Carpal tunnel syndrome   . Thoracic radiculopathy    There were no vitals taken for this visit.     Review of Systems  Constitutional: Positive for fever and unexpected weight change.  Gastrointestinal: Positive for nausea, vomiting, abdominal pain, diarrhea and constipation.  Musculoskeletal: Positive for myalgias, back pain and arthralgias.  All other systems reviewed and are negative.       Objective:   Physical Exam  Constitutional: She is oriented to person, place, and time. She appears well-developed and well-nourished.  HENT:  Head: Normocephalic.  Neck: Neck supple.  Musculoskeletal: She exhibits tenderness.  Neurological: She is alert and oriented to person, place, and time.  Skin: Skin is warm and dry.  Psychiatric: She has a normal mood and affect.  Although she expresses a flight of ideas and being all over the place in our conversation.  Symmetric normal motor tone is noted throughout. Normal muscle bulk. Muscle testing reveals 5/5 muscle strength of the upper extremity, and 5/5 of the  lower extremity. Full range of motion in upper and lower extremities. ROM of spine is not restricted. Fine motor movements are normal in both hands.  Sensory is intact and symmetric to light touch, pinprick and proprioception.  DTR in the upper and lower extremity are present and symmetric 2+. No clonus is noted.  Patient arises from chair without difficulty. Narrow based gait with normal arm swing bilateral , able to walk on heels, toe walk not possible, because of gout, per patient . Tandem walk is stable. No pronator drift. Rhomberg  negative.        Assessment & Plan:  1. History of fibromyalgia with myofascial pain and multiple trigger points.  2. Migraine headaches.  3. Lumbar degenerative disk disease, with spondylolisthesis at L4-5. 4. History of nephrolithiasis.  5. Right flexor pollicis longus tendonitis  6. Right CTS  PLAN  Prescribed Voltaren gel for right wrist pain and tendon pain at her last visit , patient could not tolerate this medication, she states, that she got hives. 1. Advised daytime use of CTS splint if sx dictate.  2. Continue back exercises. I provided her with back exercises today. I don't think that she needs an ESI at this point. I think she's on the right track with exercise, posture, etc. . Encouraged patient to look into pool exercises, and whether her insurance supports the silver sneaker program. 3. I refilled her fentanyl 25 mcg, #10. The long term goal again is to taper the narcs.  4. She will see [name] in about one month, for multiple injections.Patient wanted to have multiple TPI basically all over her neck,  mid and lower back, I explained to her that I would not be comfortable to give her these many TPI,( especially with her stated Dx of being bipolar and multiple allergies)..I also explained to her that in my opinion exercising in the pool,and strengthening of her core muscles would provide  a more long term effect. The patient does not want to do PT right now, I also advised her to look into joining a water exercise group for fibromyalgia patients.

## 2012-06-06 ENCOUNTER — Ambulatory Visit: Payer: 59

## 2012-06-12 ENCOUNTER — Other Ambulatory Visit: Payer: Self-pay

## 2012-06-12 DIAGNOSIS — M5136 Other intervertebral disc degeneration, lumbar region: Secondary | ICD-10-CM

## 2012-06-12 DIAGNOSIS — G43519 Persistent migraine aura without cerebral infarction, intractable, without status migrainosus: Secondary | ICD-10-CM

## 2012-06-12 DIAGNOSIS — M778 Other enthesopathies, not elsewhere classified: Secondary | ICD-10-CM

## 2012-06-12 DIAGNOSIS — M797 Fibromyalgia: Secondary | ICD-10-CM

## 2012-06-12 DIAGNOSIS — M47816 Spondylosis without myelopathy or radiculopathy, lumbar region: Secondary | ICD-10-CM

## 2012-06-12 MED ORDER — HYDROCODONE-ACETAMINOPHEN 5-325 MG PO TABS: 1.0000 | ORAL_TABLET | Freq: Four times a day (QID) | ORAL | Status: DC | PRN

## 2012-06-26 ENCOUNTER — Encounter: Payer: Self-pay | Admitting: Physical Medicine & Rehabilitation

## 2012-06-26 ENCOUNTER — Encounter: Payer: 59 | Attending: Physical Medicine & Rehabilitation | Admitting: Physical Medicine & Rehabilitation

## 2012-06-26 VITALS — BP 123/71 | HR 95 | Resp 14 | Wt 141.8 lb

## 2012-06-26 DIAGNOSIS — M778 Other enthesopathies, not elsewhere classified: Secondary | ICD-10-CM | POA: Insufficient documentation

## 2012-06-26 DIAGNOSIS — G43519 Persistent migraine aura without cerebral infarction, intractable, without status migrainosus: Secondary | ICD-10-CM | POA: Insufficient documentation

## 2012-06-26 DIAGNOSIS — M5137 Other intervertebral disc degeneration, lumbosacral region: Secondary | ICD-10-CM | POA: Insufficient documentation

## 2012-06-26 DIAGNOSIS — M797 Fibromyalgia: Secondary | ICD-10-CM

## 2012-06-26 DIAGNOSIS — F341 Dysthymic disorder: Secondary | ICD-10-CM

## 2012-06-26 DIAGNOSIS — M5136 Other intervertebral disc degeneration, lumbar region: Secondary | ICD-10-CM

## 2012-06-26 DIAGNOSIS — M51379 Other intervertebral disc degeneration, lumbosacral region without mention of lumbar back pain or lower extremity pain: Secondary | ICD-10-CM | POA: Insufficient documentation

## 2012-06-26 DIAGNOSIS — IMO0001 Reserved for inherently not codable concepts without codable children: Secondary | ICD-10-CM | POA: Insufficient documentation

## 2012-06-26 DIAGNOSIS — F418 Other specified anxiety disorders: Secondary | ICD-10-CM

## 2012-06-26 DIAGNOSIS — M47817 Spondylosis without myelopathy or radiculopathy, lumbosacral region: Secondary | ICD-10-CM | POA: Insufficient documentation

## 2012-06-26 DIAGNOSIS — M47816 Spondylosis without myelopathy or radiculopathy, lumbar region: Secondary | ICD-10-CM

## 2012-06-26 MED ORDER — FENTANYL 25 MCG/HR TD PT72: 1.0000 | MEDICATED_PATCH | TRANSDERMAL | Status: DC

## 2012-06-26 MED ORDER — HYDROCODONE-ACETAMINOPHEN 5-325 MG PO TABS: 1.0000 | ORAL_TABLET | Freq: Four times a day (QID) | ORAL | Status: DC | PRN

## 2012-06-26 NOTE — Patient Instructions (Addendum)
Call me with questionsDe Quervain's Disease Suzette Battiest disease is a condition often seen in racquet sports where there is a soreness (inflammation) in the cord like structures (tendons) which attach muscle to bone on the thumb side of the wrist. There may be a tightening of the tissuesaround the tendons. This condition is often helped by giving up or modifying the activity which caused it. When conservative treatment does not help, surgery may be required. Conservative treatment could include changes in the activity which brought about the problem or made it worse. Anti-inflammatory medications and injections may be used to help decrease the inflammation and help with pain control. Your caregiver will help you determine which is best for you. DIAGNOSIS  Often the diagnosis (learning what is wrong) can be made by examination. Sometimes x-rays are required. HOME CARE INSTRUCTIONS   Apply ice to the sore area for 15 to 20 minutes, 3 to 4 times per day while awake. Put the ice in a plastic bag and place a towel between the bag of ice and your skin. This is especially helpful if it can be done after all activities involving the sore wrist.   Temporary splinting may help.   Only take over-the-counter or prescription medicines for pain, discomfort or fever as directed by your caregiver.  SEEK MEDICAL CARE IF:   Pain relief is not obtained with medications, or if you have increasing pain and seem to be getting worse rather than better.  MAKE SURE YOU:   Understand these instructions.   Will watch your condition.   Will get help right away if you are not doing well or get worse.  Document Released: 06/13/2001 Document Revised: 09/07/2011 Document Reviewed: 09/18/2005 [facility] Patient Information 2012 [place], [place].

## 2012-06-26 NOTE — Progress Notes (Signed)
Subjective:    Patient ID: Patty Salas, female    DOB: 11-19-1955, 56 y.o.   MRN: [id number]  HPI  Patty Salas is back regarding her FMS and multiple pain complaints. She is having more pain along the right wrist into the thumb. It is not necessarily associated with numbness in the hand. She is trying to stay with her exercise program and she feels it helps. She also has been more positive in general and her psychiatrist in fact was pleased with her progress over the last few months. She is involved with her church and trying to do more for herself.  She has reported some blood in her stool, but she is being followed by Dr. Kinnie Salas for hemorrhoids, etc.  She typically has good results with her TPI. She feels that they last for 3 or 4 months. She is trying to work on her posture also. Pain Inventory Average Pain 8 Pain Right Now 10 My pain is constant, sharp and stabbing  In the last 24 hours, has pain interfered with the following? General activity 8 Relation with others 8 Enjoyment of life 9 What TIME of day is your pain at its worst? evening and night Sleep (in general) Fair  Pain is worse with: walking, bending, sitting, inactivity, standing and some activites Pain improves with: rest, heat/ice, medication and injections Relief from Meds: 5  Mobility walk without assistance use a cane use a walker how many minutes can you walk? 10 ability to climb steps?  yes do you drive?  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  no  Physicians involved in your care Any changes since last visit?  no   Family History  Problem Relation Age of Onset  . Cancer Mother   . Heart disease Father   .  Hypertension Father   . Hypertension Brother   . Hypertension Brother    History   Social History  . Marital Status: Married    Spouse Name: N/A    Number of Children: N/A  . Years of Education: N/A   Social History Main Topics  . Smoking status: Never Smoker   . Smokeless tobacco: Never Used  . Alcohol Use: None  . Drug Use: None  . Sexually Active: None   Other Topics Concern  . None   Social History Narrative  . None   Past Surgical History  Procedure Date  . Abdominal hysterectomy   . Appendectomy   . Cholecystectomy   . Tonsillectomy   . Neck surgery    Past Medical History  Diagnosis Date  . Myalgia and myositis, unspecified   . Fibromyalgia   . Anxiety   . Depression   . Cervical facet syndrome   . Calcifying tendinitis of shoulder   . Carpal tunnel syndrome   . Thoracic radiculopathy    BP 123/71  Pulse 95  Resp 14  Wt 141 lb 12.8 oz (64.32 kg)  SpO2 97%   Review of Systems  Constitutional: Positive for chills and unexpected weight change.  Gastrointestinal: Positive for nausea, vomiting, abdominal pain, diarrhea and constipation.  Musculoskeletal: Positive for back pain and gait problem.       Spasms  Skin: Positive for rash.  Neurological: Positive for tremors, weakness and numbness.  Psychiatric/Behavioral:  Positive for confusion and dysphoric mood. The patient is nervous/anxious.   All other systems reviewed and are negative.       Objective:   Physical Exam Constitutional: She is oriented to person, place, and time. She appears well-developed and well-nourished.  HENT:  Head: Normocephalic and atraumatic.  Eyes: Conjunctivae and EOM are normal. Pupils are equal, round, and reactive to light.  Neck: Neck supple.  Cardiovascular: Normal rate and regular rhythm.  Pulmonary/Chest: Effort normal and breath sounds normal.  Abdominal: Soft. Bowel sounds are normal.  Musculoskeletal:  Left mid trap and splenius capitis and bilateral  lumbar parapsinal trigger points. Posture is fair with less of a head forward position. She continues to have a thoracic levoscoliosis with elevation of her right hemipelvis. Right hand notable for pain over the APL and EPB tendons.  Sensation intact. Tinel's sign equivocal today  Neurological: She is alert and oriented to person, place, and time. She has normal strength with some pain inhibition weakness in the RUE. No cranial nerve deficit or sensory deficit.  Normal gait pattern  Psychiatric: Her speech is normal and behavior is normal. Judgment normal. Her mood appears anxious.  Assessment & Plan:   ASSESSMENT:  1. History of fibromyalgia with myofascial pain and multiple trigger points.  2. Migraine headaches.  3. Lumbar degenerative disk disease.  4. History of nephrolithiasis.  5. Right flexor pollicis longus tendonitis  6. Right CTS  7. DeQuervain's Tenosynovitis  PLAN:  1.After informed consent i injected the bilateral lumbar parapsinal and right lateral trap and splenius capitis each with 2cc 1% lidocaine (a total of 4 injections were performed)  2. Advised daytime use of CTS splint if sx dictate.  3. After informed consent, I injected the along the EPB with 40mg  methylprednisolone and 2cc 1% lidocaine. The patient tolerated well. Continue relative rest, ice, bracing, etc. Don't think PT is required for this at present. 2. Continue back exercises. She should perhaps avoid some of the extreme ROM positions. 3. I refilled her Norco 5/325, #90 (pt doesn't want #120, nor do I prefer her to take that many in a month) and fentanyl 25 mcg, #10. The long term goal again is to taper the narcs.  4. She will see my PA in about one month

## 2012-06-27 ENCOUNTER — Ambulatory Visit: Payer: 59

## 2012-06-28 ENCOUNTER — Telehealth: Payer: Self-pay | Admitting: Physical Medicine & Rehabilitation

## 2012-06-28 ENCOUNTER — Encounter: Payer: 59 | Admitting: Physical Medicine & Rehabilitation

## 2012-06-28 NOTE — Telephone Encounter (Signed)
Everything better now.  Cancelled #60 Norco refill.

## 2012-07-12 ENCOUNTER — Ambulatory Visit: Payer: 59

## 2012-07-25 ENCOUNTER — Encounter: Payer: 59 | Attending: Physical Medicine and Rehabilitation | Admitting: Physical Medicine and Rehabilitation

## 2012-07-25 ENCOUNTER — Encounter: Payer: Self-pay | Admitting: Physical Medicine and Rehabilitation

## 2012-07-25 VITALS — BP 113/70 | HR 107 | Resp 14 | Ht 66.0 in | Wt 139.0 lb

## 2012-07-25 DIAGNOSIS — M5136 Other intervertebral disc degeneration, lumbar region: Secondary | ICD-10-CM

## 2012-07-25 DIAGNOSIS — M542 Cervicalgia: Secondary | ICD-10-CM | POA: Insufficient documentation

## 2012-07-25 DIAGNOSIS — IMO0001 Reserved for inherently not codable concepts without codable children: Secondary | ICD-10-CM | POA: Insufficient documentation

## 2012-07-25 DIAGNOSIS — M778 Other enthesopathies, not elsewhere classified: Secondary | ICD-10-CM

## 2012-07-25 DIAGNOSIS — M51379 Other intervertebral disc degeneration, lumbosacral region without mention of lumbar back pain or lower extremity pain: Secondary | ICD-10-CM | POA: Insufficient documentation

## 2012-07-25 DIAGNOSIS — M545 Low back pain, unspecified: Secondary | ICD-10-CM | POA: Insufficient documentation

## 2012-07-25 DIAGNOSIS — M47816 Spondylosis without myelopathy or radiculopathy, lumbar region: Secondary | ICD-10-CM

## 2012-07-25 DIAGNOSIS — M797 Fibromyalgia: Secondary | ICD-10-CM

## 2012-07-25 DIAGNOSIS — Z87442 Personal history of urinary calculi: Secondary | ICD-10-CM | POA: Insufficient documentation

## 2012-07-25 DIAGNOSIS — M5137 Other intervertebral disc degeneration, lumbosacral region: Secondary | ICD-10-CM | POA: Insufficient documentation

## 2012-07-25 DIAGNOSIS — M65839 Other synovitis and tenosynovitis, unspecified forearm: Secondary | ICD-10-CM | POA: Insufficient documentation

## 2012-07-25 DIAGNOSIS — M47817 Spondylosis without myelopathy or radiculopathy, lumbosacral region: Secondary | ICD-10-CM

## 2012-07-25 DIAGNOSIS — M654 Radial styloid tenosynovitis [de Quervain]: Secondary | ICD-10-CM | POA: Insufficient documentation

## 2012-07-25 DIAGNOSIS — G43519 Persistent migraine aura without cerebral infarction, intractable, without status migrainosus: Secondary | ICD-10-CM

## 2012-07-25 DIAGNOSIS — G43909 Migraine, unspecified, not intractable, without status migrainosus: Secondary | ICD-10-CM | POA: Insufficient documentation

## 2012-07-25 DIAGNOSIS — M25539 Pain in unspecified wrist: Secondary | ICD-10-CM | POA: Insufficient documentation

## 2012-07-25 DIAGNOSIS — G5600 Carpal tunnel syndrome, unspecified upper limb: Secondary | ICD-10-CM | POA: Insufficient documentation

## 2012-07-25 MED ORDER — HYDROCODONE-ACETAMINOPHEN 5-325 MG PO TABS: 1.0000 | ORAL_TABLET | Freq: Four times a day (QID) | ORAL | Status: DC | PRN

## 2012-07-25 MED ORDER — FENTANYL 25 MCG/HR TD PT72: 1.0000 | MEDICATED_PATCH | TRANSDERMAL | Status: DC

## 2012-07-25 NOTE — Progress Notes (Signed)
Subjective:    Patient ID: Patty Salas, female    DOB: 1956/08/07, 56 y.o.   MRN: [id number]  HPI The patient complains about chronic low back pain. The patient also complains about right wrist pain, and neck pain.  The problem has been stable. The patient states, that she feels a little worse , because the [facility] are coming up, and this time is always very difficult for her after the passing of her only daughter.   Pain Inventory Average Pain 9 Pain Right Now 9 My pain is constant, sharp, burning and stabbing  In the last 24 hours, has pain interfered with the following? General activity 8 Relation with others 9 Enjoyment of life 9 What TIME of day is your pain at its worst? all the time Sleep (in general) Fair  Pain is worse with: walking, bending, sitting, standing and some activites Pain improves with: rest, heat/ice, therapy/exercise, pacing activities, medication and injections Relief from Meds: 6  Mobility walk without assistance walk with assistance use a cane use a walker how many minutes can you walk? 5 ability to climb steps?  yes do you drive?  yes needs help with transfers transfers alone Do you have any goals in this area?  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  yes  Neuro/Psych bladder control problems bowel control problems weakness numbness tremor spasms dizziness confusion depression anxiety  Prior Studies Any changes since last visit?  no  Physicians involved in your care Any changes since last visit?  no   Family History  Problem Relation Age of Onset  . Cancer Mother   . Heart disease Father   . Hypertension Father   . Hypertension Brother   . Hypertension Brother    History   Social History  . Marital Status: Married    Spouse Name: N/A    Number of Children: N/A  . Years of  Education: N/A   Social History Main Topics  . Smoking status: Never Smoker   . Smokeless tobacco: Never Used  . Alcohol Use: None  . Drug Use: None  . Sexually Active: None   Other Topics Concern  . None   Social History Narrative  . None   Past Surgical History  Procedure Date  . Abdominal hysterectomy   . Appendectomy   . Cholecystectomy   . Tonsillectomy   . Neck surgery    Past Medical History  Diagnosis Date  . Myalgia and myositis, unspecified   . Fibromyalgia   . Anxiety   . Depression   . Cervical facet syndrome   . Calcifying tendinitis of shoulder   . Carpal tunnel syndrome   . Thoracic radiculopathy    BP 113/70  Pulse 107  Resp 14  Ht 5\' 6"  (1.676 m)  Wt 139 lb (63.05 kg)  BMI 22.44 kg/m2  SpO2 95%     Review of Systems  Constitutional: Positive for unexpected weight change.  Gastrointestinal: Positive for nausea, vomiting, abdominal pain, diarrhea and constipation.  Musculoskeletal: Positive for myalgias, back pain and arthralgias.  Neurological: Positive for weakness and numbness.  Psychiatric/Behavioral: Positive for confusion and dysphoric mood. The patient is nervous/anxious.   All other systems reviewed and are negative.       Objective:   Physical Exam Constitutional: She is oriented to person, place, and time. She appears well-developed and well-nourished.  HENT:  Head: Normocephalic.  Neck: Neck supple.  Musculoskeletal: She exhibits tenderness.  Neurological: She is alert and oriented to person, place, and time.  Skin: Skin is warm and dry.  Psychiatric: She has a normal mood and affect.  Although she expresses a flight of ideas and being all over the place in our conversation.  Symmetric normal motor tone is noted throughout. Normal muscle bulk. Muscle testing reveals 5/5 muscle strength of the upper extremity, and 5/5 of the lower extremity. Full range of motion in upper and lower extremities. ROM of spine is not restricted.  Fine motor movements are normal in both hands.  Sensory is intact and symmetric to light touch, pinprick and proprioception.  DTR in the upper and lower extremity are present and symmetric 2+. No clonus is noted.  Patient arises from chair without difficulty. Narrow based gait with normal arm swing bilateral , able to walk on heels, toe walk not possible . Tandem walk is stable. No pronator drift. Rhomberg negative.        Assessment & Plan:  1. History of fibromyalgia with myofascial pain and multiple trigger points.  2. Migraine headaches.  3. Lumbar degenerative disk disease.  4. History of nephrolithiasis.  5. Right flexor pollicis longus tendonitis  6. Right CTS  7. DeQuervain's Tenosynovitis  PLAN:  2. Continue back exercises as pain allows, continue with walking program, and applying heat and ice as needed for pain relief.   3. I refilled her Norco 5/325, #90 and fentanyl 25 mcg, #10. The long term goal again is to taper the narcs.  4. She wants to follow up with [name], because around this time she always feels sad, because of the passing of her only daughter, which increases her pain. She states, that talking to [name] also helps her psychologically, and then has a positive effect on her symptoms.

## 2012-07-25 NOTE — Patient Instructions (Signed)
Continue with your exercises as pain allows, continue with applying heat and ice as tolerated for pain relief. Continue with walking program.

## 2012-08-23 ENCOUNTER — Encounter: Payer: Self-pay | Admitting: Physical Medicine & Rehabilitation

## 2012-08-23 ENCOUNTER — Encounter: Payer: 59 | Attending: Physical Medicine & Rehabilitation | Admitting: Physical Medicine & Rehabilitation

## 2012-08-23 VITALS — BP 132/59 | HR 98 | Resp 16 | Ht 66.0 in | Wt 142.2 lb

## 2012-08-23 DIAGNOSIS — M65839 Other synovitis and tenosynovitis, unspecified forearm: Secondary | ICD-10-CM | POA: Insufficient documentation

## 2012-08-23 DIAGNOSIS — M797 Fibromyalgia: Secondary | ICD-10-CM

## 2012-08-23 DIAGNOSIS — M5136 Other intervertebral disc degeneration, lumbar region: Secondary | ICD-10-CM

## 2012-08-23 DIAGNOSIS — M47816 Spondylosis without myelopathy or radiculopathy, lumbar region: Secondary | ICD-10-CM

## 2012-08-23 DIAGNOSIS — F418 Other specified anxiety disorders: Secondary | ICD-10-CM

## 2012-08-23 DIAGNOSIS — G43519 Persistent migraine aura without cerebral infarction, intractable, without status migrainosus: Secondary | ICD-10-CM | POA: Insufficient documentation

## 2012-08-23 DIAGNOSIS — IMO0001 Reserved for inherently not codable concepts without codable children: Secondary | ICD-10-CM | POA: Insufficient documentation

## 2012-08-23 DIAGNOSIS — M51379 Other intervertebral disc degeneration, lumbosacral region without mention of lumbar back pain or lower extremity pain: Secondary | ICD-10-CM | POA: Insufficient documentation

## 2012-08-23 DIAGNOSIS — M5137 Other intervertebral disc degeneration, lumbosacral region: Secondary | ICD-10-CM | POA: Insufficient documentation

## 2012-08-23 DIAGNOSIS — M778 Other enthesopathies, not elsewhere classified: Secondary | ICD-10-CM

## 2012-08-23 DIAGNOSIS — F341 Dysthymic disorder: Secondary | ICD-10-CM

## 2012-08-23 DIAGNOSIS — M47817 Spondylosis without myelopathy or radiculopathy, lumbosacral region: Secondary | ICD-10-CM | POA: Insufficient documentation

## 2012-08-23 MED ORDER — FENTANYL 25 MCG/HR TD PT72: 1.0000 | MEDICATED_PATCH | TRANSDERMAL | Status: DC

## 2012-08-23 MED ORDER — HYDROCODONE-ACETAMINOPHEN 5-325 MG PO TABS: 1.0000 | ORAL_TABLET | Freq: Four times a day (QID) | ORAL | Status: DC | PRN

## 2012-08-23 NOTE — Progress Notes (Signed)
Subjective:    Patient ID: Patty Salas, female    DOB: 1956/05/28, 56 y.o.   MRN: [id number]  HPI  Patty Salas is back regarding her FMS and multiple pain complaints. The steroid injection helped her right wrist in September. The pain has recurred somewhat but not to the extent it was before. She wears her wrist splint during the day.   Her fentanyl and norco seem to help her pain. She has been successful in reducing her norco usage. On average she takes about 3 per day, sometimes less.  She has pain in her familiar trigger point areas in the neck, shoulder girdle, and low back.  Her IBS symptoms are problematic at times. She uses her belladonna aklyloids for relief after receiving an OK from her opthomalogist.   Pain Inventory Average Pain 9 Pain Right Now 9 My pain is constant, sharp and stabbing  In the last 24 hours, has pain interfered with the following? General activity 9 Relation with others 10 Enjoyment of life 10 What TIME of day is your pain at its worst? all the time Sleep (in general) Fair  Pain is worse with: walking, bending, sitting, inactivity, standing and some activites Pain improves with: rest, heat/ice, medication, injections and stretches Relief from Meds: 6  Mobility walk without assistance walk with assistance use a cane use a walker how many minutes can you walk? 10-15 ability to climb steps?  yes do you drive?  yes transfers alone Do you have any goals in this area?  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  yes  Neuro/Psych bladder control problems bowel control problems weakness numbness tremor spasms dizziness confusion depression anxiety  Prior Studies Any changes since last visit?  no  Physicians involved in your care Any changes since last visit?  no   Family History  Problem Relation Age of  Onset  . Cancer Mother   . Heart disease Father   . Hypertension Father   . Hypertension Brother   . Hypertension Brother    History   Social History  . Marital Status: Married    Spouse Name: N/A    Number of Children: N/A  . Years of Education: N/A   Social History Main Topics  . Smoking status: Never Smoker   . Smokeless tobacco: Never Used  . Alcohol Use: None  . Drug Use: None  . Sexually Active: None   Other Topics Concern  . None   Social History Narrative  . None   Past Surgical History  Procedure Date  . Abdominal hysterectomy   . Appendectomy   . Cholecystectomy   . Tonsillectomy   . Neck surgery    Past Medical History  Diagnosis Date  . Myalgia and myositis, unspecified   . Fibromyalgia   . Anxiety   . Depression   . Cervical facet syndrome   . Calcifying tendinitis of shoulder   . Carpal tunnel syndrome   . Thoracic radiculopathy    BP 132/59  Pulse 98  Resp 16  Ht 5\' 6"  (1.676 m)  Wt 142 lb 3.2 oz (64.501 kg)  BMI 22.95 kg/m2  SpO2 97%     Review of Systems  Constitutional: Positive for fever, chills and unexpected weight change.  Gastrointestinal: Positive for nausea, vomiting, abdominal pain, diarrhea and constipation.  Musculoskeletal: Positive for myalgias, arthralgias and gait problem.  Skin: Positive for rash.  Neurological: Positive for dizziness, tremors, weakness and numbness.       Spasms  Psychiatric/Behavioral: Positive for confusion and dysphoric mood. The patient is nervous/anxious.   All other systems reviewed and are negative.       Objective:   Physical Exam Constitutional: She is oriented to person, place, and time. She appears well-developed and well-nourished.  HENT:  Head: Normocephalic and atraumatic.  Eyes: Conjunctivae and EOM are normal. Pupils are equal, round, and reactive to light.  Neck: Neck supple.  Cardiovascular: Normal rate and regular rhythm.  Pulmonary/Chest: Effort normal and breath  sounds normal.  Abdominal: Soft. Bowel sounds are normal.  Musculoskeletal:  Left mid trap and splenius capitis and bilateral lumbar parapsinal , left rhomboid (upper ankle) trigger points. Posture is fair with less of a head forward position. She continues to have a thoracic levoscoliosis with elevation of her right hemipelvis. Right hand notable for decreased pain over the APL and EPB tendons. Sensation intact. Tinel's sign equivocal to positive today  Neurological: She is alert and oriented to person, place, and time. She has normal strength with some pain inhibition weakness in the RUE. No cranial nerve deficit or sensory deficit.  Normal gait pattern  Psychiatric: Her speech is normal and behavior is normal. Judgment normal. Her mood appears anxious.  Assessment & Plan:   ASSESSMENT:  1. History of fibromyalgia with myofascial pain and multiple trigger points.  2. Migraine headaches.  3. Lumbar degenerative disk disease.  4. History of nephrolithiasis.  5. Right flexor pollicis longus tendonitis  6. Right CTS  7. DeQuervain's Tenosynovitis   PLAN:  1.After informed consent i injected the bilateral lumbar parapsinal, left upper rhomboid, and left lateral  Mid-trap and splenius capitis each with 1.5cc 1% lidocaine (a total of 4 injections were performed)  2. Advised daytime use of CTS splint if sx dictate.  3. Recommend regular ice and activity modification for right wrist to avoid overloading her wrist. 2. Continue back exercises. She should perhaps avoid some of the extreme ROM positions.  3. I refilled her Norco 5/325, #90 (pt doesn't want #120, nor do I prefer her to take that many in a month) and fentanyl 25 mcg, #10. The long term goal again is to taper the narcs.  4. She will see my PA in about one month.  I can see her back in 3 months.  5. Again discussed the importance of managing her pyschosocial issues in order to better manage her pain.

## 2012-08-23 NOTE — Patient Instructions (Signed)
CONTINUE WITH EXERCISE AND GOOD POSTURE!

## 2012-09-03 ENCOUNTER — Ambulatory Visit: Payer: 59

## 2012-09-20 ENCOUNTER — Encounter: Payer: Self-pay | Admitting: Physical Medicine & Rehabilitation

## 2012-09-20 ENCOUNTER — Encounter: Payer: 59 | Attending: Physical Medicine & Rehabilitation | Admitting: Physical Medicine & Rehabilitation

## 2012-09-20 VITALS — BP 154/72 | HR 118 | Resp 14 | Ht 66.0 in | Wt 140.0 lb

## 2012-09-20 DIAGNOSIS — M47817 Spondylosis without myelopathy or radiculopathy, lumbosacral region: Secondary | ICD-10-CM

## 2012-09-20 DIAGNOSIS — M47816 Spondylosis without myelopathy or radiculopathy, lumbar region: Secondary | ICD-10-CM

## 2012-09-20 DIAGNOSIS — M51379 Other intervertebral disc degeneration, lumbosacral region without mention of lumbar back pain or lower extremity pain: Secondary | ICD-10-CM

## 2012-09-20 DIAGNOSIS — F418 Other specified anxiety disorders: Secondary | ICD-10-CM

## 2012-09-20 DIAGNOSIS — IMO0001 Reserved for inherently not codable concepts without codable children: Secondary | ICD-10-CM

## 2012-09-20 DIAGNOSIS — M65839 Other synovitis and tenosynovitis, unspecified forearm: Secondary | ICD-10-CM

## 2012-09-20 DIAGNOSIS — G43519 Persistent migraine aura without cerebral infarction, intractable, without status migrainosus: Secondary | ICD-10-CM

## 2012-09-20 DIAGNOSIS — M5136 Other intervertebral disc degeneration, lumbar region: Secondary | ICD-10-CM

## 2012-09-20 DIAGNOSIS — M778 Other enthesopathies, not elsewhere classified: Secondary | ICD-10-CM

## 2012-09-20 DIAGNOSIS — M5137 Other intervertebral disc degeneration, lumbosacral region: Secondary | ICD-10-CM

## 2012-09-20 DIAGNOSIS — M797 Fibromyalgia: Secondary | ICD-10-CM

## 2012-09-20 DIAGNOSIS — F341 Dysthymic disorder: Secondary | ICD-10-CM

## 2012-09-20 DIAGNOSIS — M65849 Other synovitis and tenosynovitis, unspecified hand: Secondary | ICD-10-CM | POA: Insufficient documentation

## 2012-09-20 MED ORDER — FENTANYL 25 MCG/HR TD PT72: 1.0000 | MEDICATED_PATCH | TRANSDERMAL | Status: DC

## 2012-09-20 MED ORDER — HYDROCODONE-ACETAMINOPHEN 5-325 MG PO TABS: 1.0000 | ORAL_TABLET | Freq: Four times a day (QID) | ORAL | Status: DC | PRN

## 2012-09-20 NOTE — Patient Instructions (Signed)
CALL ME WITH QUESTIONS 

## 2012-09-20 NOTE — Progress Notes (Signed)
Subjective:    Patient ID: Patty Salas, female    DOB: August 09, 1956, 56 y.o.   MRN: [id number]  HPI  Billijo is back regarding her multiple pain complaints. Her right wrist seems to have been stable with splinting, ice etc. She continues to walk regularly. She is utilizing her norco for breakthrough pain as well as the fentanyl for  Baseline pain control.   Psychosocially things appear to be up and down. Her relationship may be a little better with her husband but his health appears to be failing at present.  Pain Inventory Average Pain 8 Pain Right Now 9 My pain is constant, sharp and stabbing  In the last 24 hours, has pain interfered with the following? General activity 9 Relation with others 10 Enjoyment of life 10 What TIME of day is your pain at its worst? all the time Sleep (in general) Fair  Pain is worse with: walking, bending, sitting, inactivity, standing and some activites Pain improves with: rest, heat/ice, therapy/exercise, medication and injections Relief from Meds: 7  Mobility walk without assistance walk with assistance use a cane use a walker how many minutes can you walk? 10-15 ability to climb steps?  yes do you drive?  yes needs help with transfers transfers alone Do you have any goals in this area?  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  yes  Neuro/Psych bladder control problems bowel control problems weakness numbness tremor spasms dizziness confusion depression anxiety  Prior Studies Any changes since last visit?  no  Physicians involved in your care Any changes since last visit?  no   Family History  Problem Relation Age of Onset  . Cancer Mother   . Heart disease Father   . Hypertension Father   . Hypertension Brother   . Hypertension Brother    History   Social History  . Marital  Status: Married    Spouse Name: N/A    Number of Children: N/A  . Years of Education: N/A   Social History Main Topics  . Smoking status: Never Smoker   . Smokeless tobacco: Never Used  . Alcohol Use: None  . Drug Use: None  . Sexually Active: None   Other Topics Concern  . None   Social History Narrative  . None   Past Surgical History  Procedure Date  . Abdominal hysterectomy   . Appendectomy   . Cholecystectomy   . Tonsillectomy   . Neck surgery    Past Medical History  Diagnosis Date  . Myalgia and myositis, unspecified   . Fibromyalgia   . Anxiety   . Depression   . Cervical facet syndrome   . Calcifying tendinitis of shoulder   . Carpal tunnel syndrome   . Thoracic radiculopathy    BP 154/72  Pulse 118  Resp 14  Ht 5\' 6"  (1.676 m)  Wt 140 lb (63.504 kg)  BMI 22.60 kg/m2  SpO2 98%     Review of Systems  Constitutional: Positive for fever and unexpected weight change.  Gastrointestinal: Positive for nausea, vomiting, abdominal pain, diarrhea and constipation.  Musculoskeletal: Positive for myalgias and arthralgias.  Neurological: Positive for tremors, weakness and numbness.  Psychiatric/Behavioral: Positive for confusion and dysphoric mood. The patient is nervous/anxious.   All other systems reviewed and are negative.       Objective:   Physical Exam Constitutional:  She is oriented to person, place, and time. She appears well-developed and well-nourished.  HENT:  Head: Normocephalic and atraumatic.  Eyes: Conjunctivae and EOM are normal. Pupils are equal, round, and reactive to light.  Neck: Neck supple.  Cardiovascular: Normal rate and regular rhythm.  Pulmonary/Chest: Effort normal and breath sounds normal.  Abdominal: Soft. Bowel sounds are normal.  Musculoskeletal:  Left mid trap and right splenius capitis and bilateral lumbar parapsinal at L5 . Posture is fair with less of a head forward position. She continues to have a thoracic  levoscoliosis with elevation of her right hemipelvis. Right hand notable for decreased pain over the APL and EPB tendons. Sensation intact. Tinel's sign equivocal to positive today  Neurological: She is alert and oriented to person, place, and time. She has normal strength with some pain inhibition weakness in the RUE. No cranial nerve deficit or sensory deficit.  Normal gait pattern  Psychiatric: Her speech is normal and behavior is normal. Judgment normal. Her mood appears less anxious. She's in good spirits.   Assessment & Plan:   ASSESSMENT:  1. History of fibromyalgia with myofascial pain and multiple trigger points.  2. Migraine headaches.  3. Lumbar degenerative disk disease.  4. History of nephrolithiasis.  5. Right flexor pollicis longus tendonitis  6. Right CTS  7. DeQuervain's Tenosynovitis   PLAN:  1.After informed consent i injected the bilateral lumbar parapsinal, left upper rhomboid, and left lateral Mid-trap and splenius capitis each with 1.5cc 1% lidocaine (a total of 4 injections were performed)  2. Advised daytime use of CTS splint if sx dictate. Utilize ice also. 3. Recommend regular ice and activity modification for right wrist to avoid overloading her wrist.  2. Continue back exercises. She should perhaps avoid some of the extreme ROM positions.  3. I refilled her Norco 5/325, #90 and fentanyl 25 mcg, #10. Still the  long term goal again is to taper the narcs.  4. She will see my PA in about one month. I can see her back in 3 months.  5. Again discussed the importance of managing her pyschosocial issues in order to better manage her pain

## 2012-10-11 ENCOUNTER — Ambulatory Visit: Payer: 59

## 2012-10-16 ENCOUNTER — Telehealth: Payer: Self-pay | Admitting: *Deleted

## 2012-10-16 DIAGNOSIS — M797 Fibromyalgia: Secondary | ICD-10-CM

## 2012-10-16 DIAGNOSIS — M47816 Spondylosis without myelopathy or radiculopathy, lumbar region: Secondary | ICD-10-CM

## 2012-10-16 DIAGNOSIS — M5136 Other intervertebral disc degeneration, lumbar region: Secondary | ICD-10-CM

## 2012-10-16 DIAGNOSIS — M778 Other enthesopathies, not elsewhere classified: Secondary | ICD-10-CM

## 2012-10-16 DIAGNOSIS — G43519 Persistent migraine aura without cerebral infarction, intractable, without status migrainosus: Secondary | ICD-10-CM

## 2012-10-16 NOTE — Telephone Encounter (Signed)
Patient had to cancel her appointment with Clydie Braun due to having the Flu. She needs refills on her Fentanyl Patch and Hydrocodone . Also on her Hydrocodone she is only receiving #90 which is only a 23 day supply. Would like to know if this could be increased to a 30 day supply?Marland Kitchen Also would like to know if during her next OV with [name] she can receive trigger point injections and/or a right wrist injection. Please advise

## 2012-10-17 ENCOUNTER — Other Ambulatory Visit: Payer: Self-pay | Admitting: Physical Medicine & Rehabilitation

## 2012-10-18 ENCOUNTER — Telehealth: Payer: Self-pay | Admitting: *Deleted

## 2012-10-18 ENCOUNTER — Encounter: Payer: 59 | Admitting: Physical Medicine and Rehabilitation

## 2012-10-18 MED ORDER — HYDROCODONE-ACETAMINOPHEN 5-325 MG PO TABS: 1.0000 | ORAL_TABLET | Freq: Four times a day (QID) | ORAL | Status: DC | PRN

## 2012-10-18 MED ORDER — FENTANYL 25 MCG/HR TD PT72: 1.0000 | MEDICATED_PATCH | TRANSDERMAL | Status: DC

## 2012-10-18 NOTE — Telephone Encounter (Signed)
Error see precious Telephone message.

## 2012-10-18 NOTE — Telephone Encounter (Signed)
Notified patient. Called in Hydrocodone to pharmacy per patients request. Patient aware Fentanyl Prescription is available for pick up.

## 2012-10-18 NOTE — Telephone Encounter (Signed)
We can fill meds. The hydrocodone IS a 90 day supply as the intention was to use them less than 4 times per day. We can do injections as always when they are indicated.

## 2012-11-06 ENCOUNTER — Ambulatory Visit: Payer: 59

## 2012-11-08 ENCOUNTER — Encounter: Payer: Self-pay | Admitting: Physical Medicine & Rehabilitation

## 2012-11-08 ENCOUNTER — Encounter: Payer: 59 | Attending: Physical Medicine & Rehabilitation | Admitting: Physical Medicine & Rehabilitation

## 2012-11-08 VITALS — BP 133/72 | HR 108 | Resp 14 | Ht 66.0 in | Wt 142.0 lb

## 2012-11-08 DIAGNOSIS — M51379 Other intervertebral disc degeneration, lumbosacral region without mention of lumbar back pain or lower extremity pain: Secondary | ICD-10-CM | POA: Insufficient documentation

## 2012-11-08 DIAGNOSIS — M5136 Other intervertebral disc degeneration, lumbar region: Secondary | ICD-10-CM

## 2012-11-08 DIAGNOSIS — G43519 Persistent migraine aura without cerebral infarction, intractable, without status migrainosus: Secondary | ICD-10-CM | POA: Insufficient documentation

## 2012-11-08 DIAGNOSIS — M5137 Other intervertebral disc degeneration, lumbosacral region: Secondary | ICD-10-CM

## 2012-11-08 DIAGNOSIS — M549 Dorsalgia, unspecified: Secondary | ICD-10-CM | POA: Insufficient documentation

## 2012-11-08 DIAGNOSIS — M778 Other enthesopathies, not elsewhere classified: Secondary | ICD-10-CM

## 2012-11-08 DIAGNOSIS — M47816 Spondylosis without myelopathy or radiculopathy, lumbar region: Secondary | ICD-10-CM

## 2012-11-08 DIAGNOSIS — M47817 Spondylosis without myelopathy or radiculopathy, lumbosacral region: Secondary | ICD-10-CM | POA: Insufficient documentation

## 2012-11-08 DIAGNOSIS — M542 Cervicalgia: Secondary | ICD-10-CM | POA: Insufficient documentation

## 2012-11-08 DIAGNOSIS — IMO0001 Reserved for inherently not codable concepts without codable children: Secondary | ICD-10-CM

## 2012-11-08 DIAGNOSIS — M797 Fibromyalgia: Secondary | ICD-10-CM

## 2012-11-08 DIAGNOSIS — Z5181 Encounter for therapeutic drug level monitoring: Secondary | ICD-10-CM | POA: Insufficient documentation

## 2012-11-08 DIAGNOSIS — M65839 Other synovitis and tenosynovitis, unspecified forearm: Secondary | ICD-10-CM | POA: Insufficient documentation

## 2012-11-08 MED ORDER — FENTANYL 25 MCG/HR TD PT72: 1.0000 | MEDICATED_PATCH | TRANSDERMAL | Status: DC

## 2012-11-08 MED ORDER — HYDROCODONE-ACETAMINOPHEN 5-325 MG PO TABS: 1.0000 | ORAL_TABLET | Freq: Four times a day (QID) | ORAL | Status: DC | PRN

## 2012-11-08 NOTE — Patient Instructions (Signed)
CALL WITH QUESTIONS OR PROBLEMS

## 2012-11-08 NOTE — Progress Notes (Addendum)
Subjective:    Patient ID: Patty Salas, female    DOB: 06/07/1956, 57 y.o.   MRN: [id number]  HPI [name] is back regarding her chronic pain syndrome which includes, myofascial pain, FMS, and migraine headaches. She has questions about potential botox injections for her migraine headaches.   She was place recently on axert by Dr. Sandria Manly which has proven helpful for her headaches. Her headaches are happening from 16-18 times per month. Headaches are most severe in the right temporal area but she also has sx in the frontal and left temporal regions. Headaches are usually preceded by an aura of floating figures/objects in her visual field.  She maintains on her current medications for breakthrough pain which include fentanyl and hydrocodone.  Her back is bothering her again especially over these cooler months.   Pain Inventory Average Pain 9 Pain Right Now 8 My pain is sharp and stabbing  In the last 24 hours, has pain interfered with the following? General activity 9 Relation with others 9 Enjoyment of life 9 What TIME of day is your pain at its worst? all the time Sleep (in general) Fair  Pain is worse with: walking, bending, sitting, inactivity, standing and some activites Pain improves with: rest, heat/ice, therapy/exercise, medication and injections Relief from Meds: 8  Mobility walk without assistance walk with assistance use a cane use a walker how many minutes can you walk? 15 ability to climb steps?  yes do you drive?  yes needs help with transfers transfers alone Do you have any goals in this area?  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  yes  Neuro/Psych bladder control problems bowel control problems weakness numbness tremor spasms dizziness confusion depression anxiety  Prior Studies Any changes since last visit?  no  Physicians  involved in your care Any changes since last visit?  no   Family History  Problem Relation Age of Onset  . Cancer Mother   . Heart disease Father   . Hypertension Father   . Hypertension Brother   . Hypertension Brother    History   Social History  . Marital Status: Married    Spouse Name: N/A    Number of Children: N/A  . Years of Education: N/A   Social History Main Topics  . Smoking status: Never Smoker   . Smokeless tobacco: Never Used  . Alcohol Use: None  . Drug Use: None  . Sexually Active: None   Other Topics Concern  . None   Social History Narrative  . None   Past Surgical History  Procedure Date  . Abdominal hysterectomy   . Appendectomy   . Cholecystectomy   . Tonsillectomy   . Neck surgery    Past Medical History  Diagnosis Date  . Myalgia and myositis, unspecified   . Fibromyalgia   . Anxiety   . Depression   . Cervical facet syndrome   . Calcifying tendinitis of shoulder   . Carpal tunnel syndrome   . Thoracic radiculopathy    BP 133/72  Pulse 108  Resp 14  Ht 5\' 6"  (1.676 m)  Wt 142 lb (64.411 kg)  BMI 22.92 kg/m2  SpO2 96%     Review of Systems  Constitutional: Positive for fever, chills and unexpected weight change.  Gastrointestinal: Positive for nausea, abdominal pain, diarrhea and constipation.  Neurological: Positive for headaches.  All other systems reviewed and are negative.       Objective:   Physical Exam Constitutional: She is oriented to person, place, and time. She appears well-developed and well-nourished.  HENT:  Head: Normocephalic and atraumatic.  Eyes: Conjunctivae and EOM are normal. Pupils are equal, round, and reactive to light.  Neck: Neck supple.  Cardiovascular: Normal rate and regular rhythm.  Pulmonary/Chest: Effort normal and breath sounds normal.  Abdominal: Soft. Bowel sounds are normal.  Musculoskeletal:  right mid trap and bilateral splenius capitis and to a lesser extent the bilateral  lumbar parapsinal at L5 . Posture is fair with less of a head forward position. She continues to have a thoracic levoscoliosis with elevation of her right hemipelvis. Right hand notable for decreased pain over the APL and EPB tendons. Sensation intact. Tinel's sign equivocal to positive today  Neurological: She is alert and oriented to person, place, and time. She has normal strength with some pain inhibition weakness in the RUE. No cranial nerve deficit or sensory deficit.  Normal gait pattern  Psychiatric: Her speech is normal and behavior is normal. Judgment normal. Her mood appears less anxious. She's in good spirits.    Assessment & Plan:   ASSESSMENT:  1. History of fibromyalgia with myofascial pain and multiple trigger points.  2. Migraine headaches.  3. Lumbar degenerative disk disease.  4. History of nephrolithiasis.  5. Right flexor pollicis longus tendonitis  6. Right CTS  7. DeQuervain's Tenosynovitis   PLAN:  1.After informed consent i injected the bilateral  splenius capitis and right trap each with 1.5cc 1% lidocaine (a total of 3 injections were performed)  2. Advised daytime use of CTS splint if sx dictate. Utilize ice also.  3. We certainly could perform botox for her chronic migraine headaches with aura. We will check with insurance regarding coverage. 2. Continue back exercises. She should perhaps avoid some of the extreme ROM positions. Consider LESI if symptoms don't improve when the weather warms up. 3. I refilled her Norco 5/325, #90 and fentanyl 25 mcg, #10. Still the long term goal again is to taper the narcs.  4. She will see my PA in about one month. 30 minutes of face to face patient care time were spent during this visit. All questions were encouraged and answered.

## 2012-12-05 ENCOUNTER — Encounter: Payer: Self-pay | Admitting: Physical Medicine and Rehabilitation

## 2012-12-05 ENCOUNTER — Encounter: Payer: 59 | Attending: Physical Medicine and Rehabilitation | Admitting: Physical Medicine and Rehabilitation

## 2012-12-05 DIAGNOSIS — M542 Cervicalgia: Secondary | ICD-10-CM | POA: Insufficient documentation

## 2012-12-05 DIAGNOSIS — M5137 Other intervertebral disc degeneration, lumbosacral region: Secondary | ICD-10-CM | POA: Insufficient documentation

## 2012-12-05 DIAGNOSIS — M51379 Other intervertebral disc degeneration, lumbosacral region without mention of lumbar back pain or lower extremity pain: Secondary | ICD-10-CM | POA: Insufficient documentation

## 2012-12-05 DIAGNOSIS — G43909 Migraine, unspecified, not intractable, without status migrainosus: Secondary | ICD-10-CM | POA: Insufficient documentation

## 2012-12-05 DIAGNOSIS — M654 Radial styloid tenosynovitis [de Quervain]: Secondary | ICD-10-CM | POA: Insufficient documentation

## 2012-12-05 DIAGNOSIS — IMO0002 Reserved for concepts with insufficient information to code with codable children: Secondary | ICD-10-CM

## 2012-12-05 DIAGNOSIS — G5600 Carpal tunnel syndrome, unspecified upper limb: Secondary | ICD-10-CM | POA: Insufficient documentation

## 2012-12-05 DIAGNOSIS — M797 Fibromyalgia: Secondary | ICD-10-CM

## 2012-12-05 DIAGNOSIS — IMO0001 Reserved for inherently not codable concepts without codable children: Secondary | ICD-10-CM | POA: Insufficient documentation

## 2012-12-05 DIAGNOSIS — M545 Low back pain, unspecified: Secondary | ICD-10-CM | POA: Insufficient documentation

## 2012-12-05 DIAGNOSIS — G43519 Persistent migraine aura without cerebral infarction, intractable, without status migrainosus: Secondary | ICD-10-CM

## 2012-12-05 DIAGNOSIS — M25539 Pain in unspecified wrist: Secondary | ICD-10-CM | POA: Insufficient documentation

## 2012-12-05 DIAGNOSIS — M5136 Other intervertebral disc degeneration, lumbar region: Secondary | ICD-10-CM

## 2012-12-05 DIAGNOSIS — M778 Other enthesopathies, not elsewhere classified: Secondary | ICD-10-CM

## 2012-12-05 DIAGNOSIS — M47816 Spondylosis without myelopathy or radiculopathy, lumbar region: Secondary | ICD-10-CM

## 2012-12-05 DIAGNOSIS — M65839 Other synovitis and tenosynovitis, unspecified forearm: Secondary | ICD-10-CM | POA: Insufficient documentation

## 2012-12-05 DIAGNOSIS — Z87442 Personal history of urinary calculi: Secondary | ICD-10-CM | POA: Insufficient documentation

## 2012-12-05 DIAGNOSIS — G43709 Chronic migraine without aura, not intractable, without status migrainosus: Secondary | ICD-10-CM

## 2012-12-05 DIAGNOSIS — M47817 Spondylosis without myelopathy or radiculopathy, lumbosacral region: Secondary | ICD-10-CM

## 2012-12-05 MED ORDER — HYDROCODONE-ACETAMINOPHEN 5-325 MG PO TABS: 1.0000 | ORAL_TABLET | Freq: Four times a day (QID) | ORAL | Status: DC | PRN

## 2012-12-05 MED ORDER — FENTANYL 25 MCG/HR TD PT72: 1.0000 | MEDICATED_PATCH | TRANSDERMAL | Status: DC

## 2012-12-05 NOTE — Progress Notes (Signed)
Subjective:    Patient ID: Patty Salas, female    DOB: 1956/07/13, 57 y.o.   MRN: [id number]  HPI The patient complains about chronic low back pain. The patient also complains about right wrist pain, and neck pain. Injections into her neck muscles have given her about 60 % relief. She also states, that her right wrist pain has gotten better. The problem has been stable.   Pain Inventory Average Pain 9 Pain Right Now 8 My pain is constant, sharp and stabbing  In the last 24 hours, has pain interfered with the following? General activity 10 Relation with others 10 Enjoyment of life 10 What TIME of day is your pain at its worst? constant Sleep (in general) Fair  Pain is worse with: walking, bending, sitting, inactivity, standing and some activites Pain improves with: rest, heat/ice, therapy/exercise, medication, injections and imagery, music, exercise, crossword puzzles Relief from Meds: 6  Mobility walk without assistance walk with assistance use a cane use a walker how many minutes can you walk? 15 ability to climb steps?  yes do you drive?  yes needs help with transfers transfers alone Do you have any goals in this area?  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  yes  Neuro/Psych bladder control problems bowel control problems weakness numbness tremor spasms dizziness confusion depression anxiety  Prior Studies Any changes since last visit?  no  Physicians involved in your care Any changes since last visit?  no   Family History  Problem Relation Age of Onset  . Cancer Mother   . Heart disease Father   . Hypertension Father   . Hypertension Brother   . Hypertension Brother    History   Social History  . Marital Status: Married    Spouse Name: N/A    Number of Children: N/A  . Years of Education: N/A   Social  History Main Topics  . Smoking status: Never Smoker   . Smokeless tobacco: Never Used  . Alcohol Use: None  . Drug Use: None  . Sexually Active: None   Other Topics Concern  . None   Social History Narrative  . None   Past Surgical History  Procedure Laterality Date  . Abdominal hysterectomy    . Appendectomy    . Cholecystectomy    . Tonsillectomy    . Neck surgery     Past Medical History  Diagnosis Date  . Myalgia and myositis, unspecified   . Fibromyalgia   . Anxiety   . Depression   . Cervical facet syndrome   . Calcifying tendinitis of shoulder   . Carpal tunnel syndrome   . Thoracic radiculopathy    There were no vitals taken for this visit.     Review of Systems  Constitutional: Positive for unexpected weight change.  Respiratory: Positive for cough and wheezing.   Gastrointestinal: Positive for nausea, vomiting, diarrhea and constipation.  Musculoskeletal: Positive for myalgias and arthralgias.  Neurological: Positive for dizziness, tremors, weakness and numbness.  Psychiatric/Behavioral: Positive for confusion and dysphoric mood. The patient is nervous/anxious.   All other systems reviewed and are negative.       Objective:   Physical Exam Constitutional: She is oriented to person, place, and time. She appears well-developed and well-nourished.  HENT:  Head: Normocephalic.  Neck: Neck supple.  Musculoskeletal: She exhibits tenderness.  Neurological: She  is alert and oriented to person, place, and time.  Skin: Skin is warm and dry.  Psychiatric: She has a normal mood and affect.  Although she expresses a flight of ideas and being all over the place in our conversation.  Symmetric normal motor tone is noted throughout. Normal muscle bulk. Muscle testing reveals 5/5 muscle strength of the upper extremity, and 5/5 of the lower extremity. Full range of motion in upper and lower extremities. ROM of spine is not restricted. Fine motor movements are  normal in both hands.  Sensory is intact and symmetric to light touch, pinprick and proprioception.  DTR in the upper and lower extremity are present and symmetric 2+. No clonus is noted.  Patient arises from chair without difficulty. Narrow based gait with normal arm swing bilateral , able to walk on heels, toe walk not possible . Tandem walk is stable. No pronator drift. Rhomberg negative.        Assessment & Plan:  1. History of fibromyalgia with myofascial pain and multiple trigger points, improved by 60 % after last injections.  2. Migraine headaches.  3. Lumbar degenerative disk disease.  4. History of nephrolithiasis.  5. Right flexor pollicis longus tendonitis  6. Right CTS  7. DeQuervain's Tenosynovitis  PLAN:  1. Continue back exercises as pain allows, continue with walking program, and applying heat and ice as needed for pain relief.  2. I refilled her Norco 5/325, #90 and fentanyl 25 mcg, #10. The long term goal again is to taper the narcs.  Patient has not filled out forms to get Botox injections approved yet. Follow up with [name] in one month.

## 2012-12-05 NOTE — Patient Instructions (Signed)
Stay as active as tolerated, restart with your exercise program when you feel healthier.

## 2012-12-12 ENCOUNTER — Telehealth: Payer: Self-pay

## 2012-12-12 DIAGNOSIS — M778 Other enthesopathies, not elsewhere classified: Secondary | ICD-10-CM

## 2012-12-12 DIAGNOSIS — M797 Fibromyalgia: Secondary | ICD-10-CM

## 2012-12-12 DIAGNOSIS — M5136 Other intervertebral disc degeneration, lumbar region: Secondary | ICD-10-CM

## 2012-12-12 DIAGNOSIS — M47816 Spondylosis without myelopathy or radiculopathy, lumbar region: Secondary | ICD-10-CM

## 2012-12-12 DIAGNOSIS — G43519 Persistent migraine aura without cerebral infarction, intractable, without status migrainosus: Secondary | ICD-10-CM

## 2012-12-12 NOTE — Telephone Encounter (Signed)
Patient is in increase pain due to kidney stones.  She would like to get a script for her patches and hydrocodone to fill early.  Please advise.

## 2012-12-13 MED ORDER — HYDROCODONE-ACETAMINOPHEN 5-325 MG PO TABS: 1.0000 | ORAL_TABLET | Freq: Four times a day (QID) | ORAL | Status: DC | PRN

## 2012-12-13 NOTE — Telephone Encounter (Signed)
Patient aware hydrocodone called in.  She is aware to follow patch directions.

## 2012-12-13 NOTE — Telephone Encounter (Signed)
i will fill hydrocodone early. Not patches. (she shouldn't be using more patches without our direction!) Wrote phone in order for [employer]

## 2013-01-01 ENCOUNTER — Encounter: Payer: 59 | Attending: Physical Medicine & Rehabilitation | Admitting: Physical Medicine & Rehabilitation

## 2013-01-01 ENCOUNTER — Encounter: Payer: Self-pay | Admitting: Physical Medicine & Rehabilitation

## 2013-01-01 VITALS — BP 153/77 | HR 111 | Resp 15 | Ht 66.0 in | Wt 140.0 lb

## 2013-01-01 DIAGNOSIS — M65839 Other synovitis and tenosynovitis, unspecified forearm: Secondary | ICD-10-CM

## 2013-01-01 DIAGNOSIS — F418 Other specified anxiety disorders: Secondary | ICD-10-CM

## 2013-01-01 DIAGNOSIS — M51379 Other intervertebral disc degeneration, lumbosacral region without mention of lumbar back pain or lower extremity pain: Secondary | ICD-10-CM | POA: Insufficient documentation

## 2013-01-01 DIAGNOSIS — M5137 Other intervertebral disc degeneration, lumbosacral region: Secondary | ICD-10-CM

## 2013-01-01 DIAGNOSIS — M797 Fibromyalgia: Secondary | ICD-10-CM

## 2013-01-01 DIAGNOSIS — G43519 Persistent migraine aura without cerebral infarction, intractable, without status migrainosus: Secondary | ICD-10-CM

## 2013-01-01 DIAGNOSIS — M5136 Other intervertebral disc degeneration, lumbar region: Secondary | ICD-10-CM

## 2013-01-01 DIAGNOSIS — M47817 Spondylosis without myelopathy or radiculopathy, lumbosacral region: Secondary | ICD-10-CM

## 2013-01-01 DIAGNOSIS — F341 Dysthymic disorder: Secondary | ICD-10-CM

## 2013-01-01 DIAGNOSIS — IMO0001 Reserved for inherently not codable concepts without codable children: Secondary | ICD-10-CM

## 2013-01-01 DIAGNOSIS — M778 Other enthesopathies, not elsewhere classified: Secondary | ICD-10-CM

## 2013-01-01 DIAGNOSIS — M47816 Spondylosis without myelopathy or radiculopathy, lumbar region: Secondary | ICD-10-CM

## 2013-01-01 DIAGNOSIS — G5600 Carpal tunnel syndrome, unspecified upper limb: Secondary | ICD-10-CM | POA: Insufficient documentation

## 2013-01-01 MED ORDER — HYDROCODONE-ACETAMINOPHEN 5-325 MG PO TABS: 1.0000 | ORAL_TABLET | Freq: Four times a day (QID) | ORAL | Status: DC | PRN

## 2013-01-01 MED ORDER — FENTANYL 25 MCG/HR TD PT72: 1.0000 | MEDICATED_PATCH | TRANSDERMAL | Status: DC

## 2013-01-01 NOTE — Progress Notes (Signed)
Subjective:    Patient ID: Patty Salas, female    DOB: 1956/09/29, 57 y.o.   MRN: [id number]  HPI  Patty Salas is back regarding her chronic pain issues. She is still having some problems with her kidney stones. We had to increase her hydrocodone temporarily due to her increased pain. She has occasionally had a fever. She believes that she passed a couple stones.  Her back has also been acting up as well.  She maintains her wrist splint for her CTS. She wears at night as well.   Things remain tumultuous at home for various reasons as they relate to her husband, his family, and their relationship. She has trouble every March as it is the anniversary of her daughter's death which increases her depression, anxiety, and pain.       Pain Inventory Average Pain 9 Pain Right Now 8 My pain is constant, sharp and stabbing  In the last 24 hours, has pain interfered with the following? General activity 10 Relation with others 10 Enjoyment of life 10 What TIME of day is your pain at its worst? morning,day time,evening,night time Sleep (in general) Fair  Pain is worse with: walking, bending, sitting, inactivity, standing and some activites Pain improves with: rest, heat/ice, therapy/exercise, medication and injections Relief from Meds: 5  Mobility walk without assistance walk with assistance use a cane use a walker how many minutes can you walk? 10-15 ability to climb steps?  yes do you drive?  yes needs help with transfers transfers alone Do you have any goals in this area?  yes  Function not employed: date last employed n/a I need assistance with the following:  meal prep, household duties and shopping Do you have any goals in this area?  yes  Neuro/Psych bladder control problems bowel control problems weakness numbness tremor spasms dizziness confusion depression anxiety  Prior Studies Any changes since last visit?  no  Physicians involved in your  care Neurologist Dr. Huston Foley   Family History  Problem Relation Age of Onset  . Cancer Mother   . Heart disease Father   . Hypertension Father   . Hypertension Brother   . Hypertension Brother    History   Social History  . Marital Status: Married    Spouse Name: N/A    Number of Children: N/A  . Years of Education: N/A   Social History Main Topics  . Smoking status: Never Smoker   . Smokeless tobacco: Never Used  . Alcohol Use: None  . Drug Use: None  . Sexually Active: None   Other Topics Concern  . None   Social History Narrative  . None   Past Surgical History  Procedure Laterality Date  . Abdominal hysterectomy    . Appendectomy    . Cholecystectomy    . Tonsillectomy    . Neck surgery     Past Medical History  Diagnosis Date  . Myalgia and myositis, unspecified   . Fibromyalgia   . Anxiety   . Depression   . Cervical facet syndrome   . Calcifying tendinitis of shoulder   . Carpal tunnel syndrome   . Thoracic radiculopathy    BP 153/77  Pulse 111  Resp 15  Ht 5\' 6"  (1.676 m)  Wt 140 lb (63.504 kg)  BMI 22.61 kg/m2  SpO2 96%      Review of Systems  Constitutional: Positive for unexpected weight change.  Gastrointestinal: Positive for nausea, vomiting, diarrhea and constipation.  Neurological: Positive for  dizziness, tremors, weakness and numbness.  Psychiatric/Behavioral: Positive for confusion, dysphoric mood and agitation.  All other systems reviewed and are negative.       Objective:   Physical Exam Constitutional: She is oriented to person, place, and time. She appears well-developed and well-nourished.  HENT:  Head: Normocephalic and atraumatic.  Eyes: Conjunctivae and EOM are normal. Pupils are equal, round, and reactive to light.  Neck: Neck supple.  Cardiovascular: Normal rate and regular rhythm.  Pulmonary/Chest: Effort normal and breath sounds normal.  Abdominal: Soft. Bowel sounds are normal.  Musculoskeletal:   right mid trap and bilateral splenius capitis and to a lesser extent the bilateral lumbar parapsinal at L5 . Posture is fair with less of a head forward position. She continues to have a thoracic levoscoliosis with elevation of her right hemipelvis. Right hand notable for decreased pain over the APL and EPB tendons. Sensation intact. Tinel's sign equivocal to positive today  Neurological: She is alert and oriented to person, place, and time. She has normal strength with some pain inhibition weakness in the RUE. No cranial nerve deficit or sensory deficit.  Normal gait pattern  Psychiatric: Her speech is normal and behavior is normal. Judgment normal. Her mood appears less anxious. She's in good spirits.   Assessment & Plan:   ASSESSMENT:  1. History of fibromyalgia with myofascial pain and multiple trigger points.  2. Chronic migraine headaches.  3. Lumbar degenerative disk disease, L4-5 4. History of nephrolithiasis.  5. Right flexor pollicis longus tendonitis  6. Right CTS  7. DeQuervain's Tenosynovitis   PLAN:  1.After informed consent i injected the bilateral splenius capitis, right lumbar paraspinal, and right trap each with 1.5cc 1% lidocaine (a total of 4 injections were performed)  2. Advised daytime use of CTS splint if sx dictate. Utilize ice also.  3. Consider botox injections in the future for migraine rx/prophylaxis.  2. Given that her back pain has not responded to exercises and more conservative measures, I will order an intralaminar LESI at L4-L5 per Dr. Wynn Banker. 3. I refilled her Norco 5/325, #90 and fentanyl 25 mcg, #10. Still the long term goal again is to taper the narcs.  4. She will see dr. Wynn Banker in about one month. 30 minutes of face to face patient care time were spent during this visit. All questions were encouraged and answered.

## 2013-01-01 NOTE — Patient Instructions (Signed)
CALL ME WITH ANY PROBLEMS OR QUESTIONS (#[phone]).  HAVE A GOOD DAY  

## 2013-01-07 ENCOUNTER — Telehealth: Payer: Self-pay | Admitting: *Deleted

## 2013-01-07 ENCOUNTER — Encounter: Payer: Self-pay | Admitting: *Deleted

## 2013-01-07 NOTE — Telephone Encounter (Signed)
Has questions about ESI scheduled. Needs Bella donna  Phenobarb dose corrected. It is 16.2 mg 1 q 6-8

## 2013-01-09 ENCOUNTER — Encounter: Payer: Self-pay | Admitting: *Deleted

## 2013-01-09 NOTE — Telephone Encounter (Signed)
No further questions about injection

## 2013-01-22 ENCOUNTER — Other Ambulatory Visit: Payer: Self-pay | Admitting: Neurology

## 2013-01-24 ENCOUNTER — Telehealth: Payer: Self-pay | Admitting: Neurology

## 2013-01-24 NOTE — Telephone Encounter (Signed)
This medication os not a preferred drug on any insurance formulary.  I have initiated prior auth.  Pending ins response.  #8 + 1 refill was sent to the pharmacy.  SM advised this is the amount Dr Donald Pore, as the patient is waiting to be assigned to a new provider (former [facility] patient).

## 2013-01-24 NOTE — Telephone Encounter (Signed)
Patient called and states that she can not get her axcert filled because it requires a prior authorization. She also states that the co-payment is 344.73 for 24 tablets and is cost prohibitive in 8 tab doses. She is requesting the 24 tab script for this reason. Dr. Frances Furbish, please address. I will send this to pharmacy as well. Also, I recommended she go on-line to see if she could find an application for axcert patient assistance program to assist with cost of medications.

## 2013-01-27 NOTE — Telephone Encounter (Signed)
Note given to front desk for scheduling app with Dr. Frances Furbish.

## 2013-01-28 ENCOUNTER — Other Ambulatory Visit: Payer: Self-pay

## 2013-01-28 DIAGNOSIS — Z1231 Encounter for screening mammogram for malignant neoplasm of breast: Secondary | ICD-10-CM

## 2013-01-30 ENCOUNTER — Encounter: Payer: Self-pay | Admitting: Physical Medicine & Rehabilitation

## 2013-01-30 ENCOUNTER — Encounter: Payer: 59 | Attending: Physical Medicine & Rehabilitation

## 2013-01-30 ENCOUNTER — Ambulatory Visit (HOSPITAL_BASED_OUTPATIENT_CLINIC_OR_DEPARTMENT_OTHER): Payer: 59 | Admitting: Physical Medicine & Rehabilitation

## 2013-01-30 VITALS — BP 123/51 | HR 96 | Resp 14 | Ht 66.0 in | Wt 138.6 lb

## 2013-01-30 DIAGNOSIS — M65839 Other synovitis and tenosynovitis, unspecified forearm: Secondary | ICD-10-CM

## 2013-01-30 DIAGNOSIS — M5137 Other intervertebral disc degeneration, lumbosacral region: Secondary | ICD-10-CM | POA: Insufficient documentation

## 2013-01-30 DIAGNOSIS — M542 Cervicalgia: Secondary | ICD-10-CM | POA: Insufficient documentation

## 2013-01-30 DIAGNOSIS — IMO0001 Reserved for inherently not codable concepts without codable children: Secondary | ICD-10-CM | POA: Insufficient documentation

## 2013-01-30 DIAGNOSIS — M47817 Spondylosis without myelopathy or radiculopathy, lumbosacral region: Secondary | ICD-10-CM

## 2013-01-30 DIAGNOSIS — M5136 Other intervertebral disc degeneration, lumbar region: Secondary | ICD-10-CM

## 2013-01-30 DIAGNOSIS — IMO0002 Reserved for concepts with insufficient information to code with codable children: Secondary | ICD-10-CM

## 2013-01-30 DIAGNOSIS — Z5181 Encounter for therapeutic drug level monitoring: Secondary | ICD-10-CM | POA: Insufficient documentation

## 2013-01-30 DIAGNOSIS — M47816 Spondylosis without myelopathy or radiculopathy, lumbar region: Secondary | ICD-10-CM

## 2013-01-30 DIAGNOSIS — G43519 Persistent migraine aura without cerebral infarction, intractable, without status migrainosus: Secondary | ICD-10-CM | POA: Insufficient documentation

## 2013-01-30 DIAGNOSIS — M65849 Other synovitis and tenosynovitis, unspecified hand: Secondary | ICD-10-CM | POA: Insufficient documentation

## 2013-01-30 DIAGNOSIS — M51379 Other intervertebral disc degeneration, lumbosacral region without mention of lumbar back pain or lower extremity pain: Secondary | ICD-10-CM | POA: Insufficient documentation

## 2013-01-30 DIAGNOSIS — M549 Dorsalgia, unspecified: Secondary | ICD-10-CM | POA: Insufficient documentation

## 2013-01-30 DIAGNOSIS — M5416 Radiculopathy, lumbar region: Secondary | ICD-10-CM

## 2013-01-30 DIAGNOSIS — M778 Other enthesopathies, not elsewhere classified: Secondary | ICD-10-CM

## 2013-01-30 DIAGNOSIS — M797 Fibromyalgia: Secondary | ICD-10-CM

## 2013-01-30 NOTE — Progress Notes (Signed)
Patient here for L4-L5 epidural. Her pain is in the back as well as the left thigh but not below the knee. Prior MRI in 2005 showed L4-L5 broad-based disc protrusion. Recent x-rays demonstrates spondylolisthesis L4-L5.  Exam Gen. No acute distress Mood and affect appropriate Negative straight leg raising test. Femoral stretch test unable to do secondary to poor flexibility   Impression 1. Lumbar degenerative disc as well as spondylolisthesis L4-L5. It's been 9 years since her last MRI. She could have reduced epidural space at the level of the spondylolisthesis. I would like to recheck an MRI at before we inject that area to further assess. Will schedule you to her back once we have this completed

## 2013-01-30 NOTE — Progress Notes (Signed)
  PROCEDURE RECORD [facility]   Name: Patty Salas DOB:Jun 05, 1956 MRN: [id number]  Date:01/30/2013  Physician: Claudette Laws, MD    Nurse/CMA: Brailynn Breth RN  Allergies:  Allergies  Allergen Reactions  . Divalproex Sodium Anaphylaxis  . Hydrochlorothiazide Anaphylaxis  . Imitrex (Sumatriptan Succinate) Anaphylaxis  . Mellaril Anaphylaxis  . Olanzapine Anaphylaxis  . Aripiprazole Other (See Comments)    Stiffened muscles  . Aspirin Hives  . Dalmane (Flurazepam Hcl) Other (See Comments)    Stiffened all muscles  . Darifenacin Hydrobromide Er Hives    Hives on back and looked like sunburn  . Metoclopramide Hcl Other (See Comments)    headache  . Seroquel (Quetiapine Fumerate) Other (See Comments)    extreme fatigue and bad dreams  . Statins Hives  . Stelazine Other (See Comments)    Stiffened muscles  . Thorazine (Chlorpromazine Hcl) Other (See Comments)    Stiffened muscles  . Allopurinol   . Barium-Containing Compounds   . Colchicine   . Iohexol      Code: HIVES, Desc: pt broke out in red rash and hives after CT injection on 12/07/09.-pt needs 13 hr prep kit, Onset Date: [id number]   . Latex   . Nsaids   . Penicillins   . Avelox (Moxifloxacin Hcl In Nacl) Nausea And Vomiting  . Diclofenac Rash  . Doxycycline Nausea Only  . E-Mycin (Erythromycin Base) Nausea Only  . Flagyl (Metronidazole Hcl) Nausea And Vomiting  . Lamictal (Lamotrigine) Rash  . Pregabalin   . Risperidone Other (See Comments)    Too sedating  . Topamax Other (See Comments)    Confusion, tremor, blurred vision    Consent Signed: yes  Is patient diabetic? no  CBG today?   Pregnant: no LMP: No LMP recorded. Patient has had a hysterectomy. (age 86-55)  Anticoagulants: no Anti-inflammatory: no Antibiotics: no  Procedure: Translaminar  Lumbar Epidural Steroid Injection Position: Prone Start Time:  End Time:  Fluoro Time:   RN/CMA Education administrator     Time 12:02      BP 123/51     Pulse 96     Respirations 14     O2 Sat 96     S/S 6 6    Pain level 10/10      D/C home with husband, patient A & O X 3, D/C instructions reviewed, and sits independently.   Procedure not done due to need for updated MRI before injection can be done.

## 2013-01-30 NOTE — Patient Instructions (Signed)
Schedule for MRI Injection after MRI completed

## 2013-01-31 ENCOUNTER — Telehealth: Payer: Self-pay | Admitting: *Deleted

## 2013-01-31 DIAGNOSIS — M5136 Other intervertebral disc degeneration, lumbar region: Secondary | ICD-10-CM

## 2013-01-31 DIAGNOSIS — M778 Other enthesopathies, not elsewhere classified: Secondary | ICD-10-CM

## 2013-01-31 DIAGNOSIS — M797 Fibromyalgia: Secondary | ICD-10-CM

## 2013-01-31 DIAGNOSIS — G43519 Persistent migraine aura without cerebral infarction, intractable, without status migrainosus: Secondary | ICD-10-CM

## 2013-01-31 DIAGNOSIS — M47816 Spondylosis without myelopathy or radiculopathy, lumbar region: Secondary | ICD-10-CM

## 2013-01-31 MED ORDER — HYDROCODONE-ACETAMINOPHEN 5-325 MG PO TABS: 1.0000 | ORAL_TABLET | Freq: Four times a day (QID) | ORAL | Status: DC | PRN

## 2013-01-31 NOTE — Telephone Encounter (Signed)
Refill norco.  Done and pt notified.

## 2013-02-05 ENCOUNTER — Ambulatory Visit
Admission: RE | Admit: 2013-02-05 | Discharge: 2013-02-05 | Disposition: A | Discharge status: Home / Self Care | Payer: 59 | Source: Ambulatory Visit | Attending: Physical Medicine & Rehabilitation | Admitting: Physical Medicine & Rehabilitation

## 2013-02-05 DIAGNOSIS — M47816 Spondylosis without myelopathy or radiculopathy, lumbar region: Secondary | ICD-10-CM

## 2013-02-05 DIAGNOSIS — M5136 Other intervertebral disc degeneration, lumbar region: Secondary | ICD-10-CM

## 2013-02-06 ENCOUNTER — Other Ambulatory Visit: Payer: Self-pay

## 2013-02-06 ENCOUNTER — Telehealth: Payer: Self-pay | Admitting: *Deleted

## 2013-02-06 MED ORDER — ALMOTRIPTAN MALATE 12.5 MG PO TABS: 12.5000 mg | ORAL_TABLET | ORAL | Status: DC | PRN

## 2013-02-06 NOTE — Telephone Encounter (Signed)
Patient spoke with Stanton Kidney regarding reched appt.  Patient care has been transferred to Dr Anne Hahn.  She has an appt in Oct and is requesting refills on Axert for 90 Days at a time.  Rx has been sent.

## 2013-02-06 NOTE — Telephone Encounter (Signed)
Appointment R/S to 07/29/13

## 2013-02-10 ENCOUNTER — Telehealth: Payer: Self-pay

## 2013-02-10 NOTE — Telephone Encounter (Signed)
There is not substantial change. Report printed

## 2013-02-10 NOTE — Telephone Encounter (Signed)
Patient would like MRI results and get a copy.  Please advise.

## 2013-02-10 NOTE — Telephone Encounter (Signed)
Patient informed of MRI results. She will follow up to discuss other problems.

## 2013-02-11 ENCOUNTER — Encounter: Payer: Self-pay | Admitting: Physical Medicine & Rehabilitation

## 2013-02-11 ENCOUNTER — Encounter: Payer: 59 | Attending: Physical Medicine and Rehabilitation | Admitting: Physical Medicine & Rehabilitation

## 2013-02-11 VITALS — BP 140/51 | HR 104 | Resp 14 | Ht 64.0 in | Wt 140.0 lb

## 2013-02-11 DIAGNOSIS — F341 Dysthymic disorder: Secondary | ICD-10-CM

## 2013-02-11 DIAGNOSIS — N200 Calculus of kidney: Secondary | ICD-10-CM

## 2013-02-11 DIAGNOSIS — F418 Other specified anxiety disorders: Secondary | ICD-10-CM

## 2013-02-11 DIAGNOSIS — M51369 Other intervertebral disc degeneration, lumbar region without mention of lumbar back pain or lower extremity pain: Secondary | ICD-10-CM

## 2013-02-11 DIAGNOSIS — M797 Fibromyalgia: Secondary | ICD-10-CM

## 2013-02-11 DIAGNOSIS — M47817 Spondylosis without myelopathy or radiculopathy, lumbosacral region: Secondary | ICD-10-CM

## 2013-02-11 DIAGNOSIS — G5600 Carpal tunnel syndrome, unspecified upper limb: Secondary | ICD-10-CM

## 2013-02-11 DIAGNOSIS — M65849 Other synovitis and tenosynovitis, unspecified hand: Secondary | ICD-10-CM | POA: Insufficient documentation

## 2013-02-11 DIAGNOSIS — M47816 Spondylosis without myelopathy or radiculopathy, lumbar region: Secondary | ICD-10-CM

## 2013-02-11 DIAGNOSIS — G43519 Persistent migraine aura without cerebral infarction, intractable, without status migrainosus: Secondary | ICD-10-CM

## 2013-02-11 DIAGNOSIS — M65839 Other synovitis and tenosynovitis, unspecified forearm: Secondary | ICD-10-CM | POA: Insufficient documentation

## 2013-02-11 DIAGNOSIS — M5137 Other intervertebral disc degeneration, lumbosacral region: Secondary | ICD-10-CM

## 2013-02-11 DIAGNOSIS — IMO0001 Reserved for inherently not codable concepts without codable children: Secondary | ICD-10-CM

## 2013-02-11 DIAGNOSIS — M51379 Other intervertebral disc degeneration, lumbosacral region without mention of lumbar back pain or lower extremity pain: Secondary | ICD-10-CM | POA: Insufficient documentation

## 2013-02-11 DIAGNOSIS — M778 Other enthesopathies, not elsewhere classified: Secondary | ICD-10-CM

## 2013-02-11 DIAGNOSIS — M5136 Other intervertebral disc degeneration, lumbar region: Secondary | ICD-10-CM

## 2013-02-11 HISTORY — DX: Calculus of kidney: N20.0

## 2013-02-11 MED ORDER — FENTANYL 25 MCG/HR TD PT72: 1.0000 | MEDICATED_PATCH | TRANSDERMAL | Status: DC

## 2013-02-11 MED ORDER — HYDROCODONE-ACETAMINOPHEN 5-325 MG PO TABS: 1.0000 | ORAL_TABLET | Freq: Four times a day (QID) | ORAL | Status: DC | PRN

## 2013-02-11 NOTE — Progress Notes (Signed)
Subjective:    Patient ID: Patty Salas, female    DOB: [birth year], 57 y.o.   MRN: [id number]  HPI  Patty Salas is back regarding her chronic pain. She had an MRI done which didn't show a lot of change in her lumbar spine. She did have some enlargement of a cyst in the lower pole of the left kidney. She has a urology appt scheduled for next month. She has an ESI set up with Dr. Wynn Banker on 5/22.  Patty Salas is very upset that this area may be "cancer."  She has had increased pain in the left low back. She has tried heat and ice. The ice seems to make it worse if anything. She seems to be "living" with her heating pad. Getting in a hot shower helps to relieve the pain. She is doing some light house work, but that is really her activity limit. The pain is in her left low back with some radiation down to her buttock. She cancelled her planned beach trip because of the pain.    Pain Inventory Average Pain 10 Pain Right Now 10 My pain is constant, sharp and stabbing  In the last 24 hours, has pain interfered with the following? General activity 10 Relation with others 10 Enjoyment of life 10 What TIME of day is your pain at its worst? constant Sleep (in general) Poor  Pain is worse with: walking, bending, sitting, inactivity, standing and some activites Pain improves with: rest, heat/ice, therapy/exercise, medication and injections Relief from Meds: 5  Mobility walk without assistance walk with assistance use a cane use a walker how many minutes can you walk? 5-10 ability to climb steps?  yes do you drive?  yes needs help with transfers transfers alone Do you have any goals in this area?  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  yes  Neuro/Psych bladder control problems bowel control  problems weakness numbness tremor tingling spasms dizziness confusion depression anxiety  Prior Studies CT/MRI  Physicians involved in your care Dr Anne Hahn   Family History  Problem Relation Age of Onset  . Cancer Mother   . Heart disease Father   . Hypertension Father   . Hypertension Brother   . Hypertension Brother    History   Social History  . Marital Status: Married    Spouse Name: N/A    Number of Children: N/A  . Years of Education: N/A   Social History Main Topics  . Smoking status: Never Smoker   . Smokeless tobacco: Never Used  . Alcohol Use: None  . Drug Use: None  . Sexually Active: None   Other Topics Concern  . None   Social History Narrative  . None   Past Surgical History  Procedure Laterality Date  . Abdominal hysterectomy    . Appendectomy    . Cholecystectomy    . Tonsillectomy    . Neck surgery     Past Medical History  Diagnosis Date  . Myalgia and myositis, unspecified   . Fibromyalgia   . Anxiety   . Depression   . Cervical facet syndrome   . Calcifying tendinitis of shoulder   . Carpal tunnel syndrome   . Thoracic radiculopathy    BP 140/51  Pulse 104  Resp 14  Ht 5\' 4"  (1.626 m)  Wt 140 lb (63.504 kg)  BMI 24.02 kg/m2  SpO2 95%     Review of Systems  Constitutional: Positive for appetite change and unexpected weight change.  Gastrointestinal: Positive for vomiting and constipation.  Genitourinary: Positive for difficulty urinating.  Musculoskeletal: Positive for back pain.  Neurological: Positive for dizziness, tremors, weakness and numbness.  Psychiatric/Behavioral: Positive for confusion and dysphoric mood. The patient is nervous/anxious.   All other systems reviewed and are negative.       Objective:   Physical Exam Constitutional: She is oriented to person, place, and time. She appears well-developed and well-nourished.  HENT:  Head: Normocephalic and atraumatic.  Eyes: Conjunctivae and EOM are  normal. Pupils are equal, round, and reactive to light.  Neck: Neck supple.  Cardiovascular: Normal rate and regular rhythm.  Pulmonary/Chest: Effort normal and breath sounds normal.  Abdominal: Soft. Bowel sounds are normal.  Musculoskeletal:  right mid trap and bilateral splenius capitis and to a lesser extent the bilateral lumbar parapsinal at L5 . She had substantial pain with palpation. She could only bend about 20 degrees forward, extension only to about 5 degrees. Lateral bending and rotation were also limited. She walks with flexed Posture and a head forward position. She continues to have a thoracic levoscoliosis with elevation of her right hemipelvis. Right hand notable for decreased pain over the APL and EPB tendons. Sensation intact. Tinel's sign equivocal to positive today  Neurological: She is alert and oriented to person, place, and time. She has normal strength with some pain inhibition weakness in the RUE. No cranial nerve deficit or sensory deficit.  Normal gait pattern  Psychiatric: Her speech is normal and behavior is normal. Judgment normal. Her mood appears less anxious. She's in good spirits.  Assessment & Plan:   ASSESSMENT:  1. History of fibromyalgia with myofascial pain and multiple trigger points.  2. Chronic migraine headaches.  3. Lumbar degenerative disk disease, L4-5  4. History of nephrolithiasis. Cysts on left kidney 5. Right flexor pollicis longus tendonitis  6. Right CTS  7. DeQuervain's Tenosynovitis    PLAN:  1.After informed consent i injected the bilateral splenius capitis, right lumbar paraspinal, and right trap each with 1.5cc 1% lidocaine (a total of 4 injections were performed)  2. Will order ultrasound of the kidneys to better assess these cysts. If there is anything to be concerned about, we will refer her more acutely to urology. Her back pain appears to be related to her disc disease, however.  3. Consider botox injections in the future for  migraine rx/prophylaxis.  2. Recent MRI confirmed exam findings. Again,  I will order an translaminar paracentral to the left LESI at L4-L5 per Dr. Wynn Banker.  3. I refilled her Norco 5/325, #90 and fentanyl 25 mcg, #10. Still the long term goal again is to taper the narcs.  4. She will see dr. Wynn Banker in about one month. 30 minutes of face to face patient care time were spent during this visit. All questions were encouraged and answered.

## 2013-02-11 NOTE — Patient Instructions (Signed)
CONTINUE WITH GENTLE ACTIVITY AROUND THE HOUSE UNTIL YOUR INJECTION IS PERFORMED

## 2013-02-14 ENCOUNTER — Ambulatory Visit
Admission: RE | Admit: 2013-02-14 | Discharge: 2013-02-14 | Disposition: A | Discharge status: Home / Self Care | Payer: 59 | Source: Ambulatory Visit | Attending: Physical Medicine & Rehabilitation | Admitting: Physical Medicine & Rehabilitation

## 2013-02-14 DIAGNOSIS — N200 Calculus of kidney: Secondary | ICD-10-CM

## 2013-02-17 ENCOUNTER — Telehealth: Payer: Self-pay | Admitting: *Deleted

## 2013-02-17 NOTE — Telephone Encounter (Signed)
Cyst in left kidney. Follow up recommended at some point to assess for change in size/morphology. She can discuss further with urology when she sees them next month.

## 2013-02-17 NOTE — Telephone Encounter (Signed)
Called wanting Dr Riley Kill to look at her report and let her know.

## 2013-02-18 NOTE — Telephone Encounter (Signed)
Will discuss at appointment.

## 2013-02-20 ENCOUNTER — Ambulatory Visit (HOSPITAL_BASED_OUTPATIENT_CLINIC_OR_DEPARTMENT_OTHER): Payer: 59 | Admitting: Physical Medicine & Rehabilitation

## 2013-02-20 ENCOUNTER — Encounter: Payer: Self-pay | Admitting: Physical Medicine & Rehabilitation

## 2013-02-20 VITALS — BP 124/60 | HR 108 | Resp 14 | Ht 64.5 in | Wt 139.6 lb

## 2013-02-20 DIAGNOSIS — IMO0002 Reserved for concepts with insufficient information to code with codable children: Secondary | ICD-10-CM

## 2013-02-20 NOTE — Progress Notes (Signed)
  PROCEDURE RECORD [facility]   Name: Patty Salas DOB:1955/12/20 MRN: [id number]  Date:02/20/2013  Physician: Claudette Laws, MD    Nurse/CMA: Shumaker RN  Allergies:  Allergies  Allergen Reactions  . Divalproex Sodium Anaphylaxis  . Hydrochlorothiazide Anaphylaxis  . Imitrex (Sumatriptan Succinate) Anaphylaxis  . Mellaril Anaphylaxis  . Olanzapine Anaphylaxis  . Aripiprazole Other (See Comments)    Stiffened muscles  . Aspirin Hives  . Dalmane (Flurazepam Hcl) Other (See Comments)    Stiffened all muscles  . Darifenacin Hydrobromide Er Hives    Hives on back and looked like sunburn  . Metoclopramide Hcl Other (See Comments)    headache  . Seroquel (Quetiapine Fumerate) Other (See Comments)    extreme fatigue and bad dreams  . Statins Hives  . Stelazine Other (See Comments)    Stiffened muscles  . Thorazine (Chlorpromazine Hcl) Other (See Comments)    Stiffened muscles  . Allopurinol   . Barium-Containing Compounds   . Colchicine   . Iohexol      Code: HIVES, Desc: pt broke out in red rash and hives after CT injection on 12/07/09.-pt needs 13 hr prep kit, Onset Date: [id number]   . Latex   . Nsaids   . Penicillins   . Avelox (Moxifloxacin Hcl In Nacl) Nausea And Vomiting  . Diclofenac Rash  . Doxycycline Nausea Only  . E-Mycin (Erythromycin Base) Nausea Only  . Flagyl (Metronidazole Hcl) Nausea And Vomiting  . Lamictal (Lamotrigine) Rash  . Pregabalin   . Risperidone Other (See Comments)    Too sedating  . Topamax Other (See Comments)    Confusion, tremor, blurred vision    Consent Signed: yes  Is patient diabetic? no  CBG today?   Pregnant: no LMP: No LMP recorded. Patient has had a hysterectomy. (age 34-55)  Anticoagulants: no Anti-inflammatory: no Antibiotics: no  Procedure: lumbar epidural steroid injection Position: Prone( Pre med  With 50 mg IM Benedryl in ruoq) Start Time: 9:31 End Time: 9:35 Fluoro Time:    RN/CMA Designer, multimedia    Time 9:07 9:40    BP 124/60 115/55    Pulse 108 96    Respirations 14 14    O2 Sat 98 98    S/S 6 6    Pain Level 10/10 7/10     D/C home with husband, patient A & O X 3, D/C instructions reviewed, and sits independently.

## 2013-02-20 NOTE — Progress Notes (Signed)
Lumbar epidural steroid injection under fluoroscopic guidance Left L4-5  Indication: Lumbosacral radiculitis is not relieved by medication management or other conservative care and interfering with self-care and mobility.  Informed consent was obtained after describing risk and benefits of the procedure with the patient, this includes bleeding, bruising, infection, paralysis and medication side effects.  The patient wishes to proceed and has given written consent.  Patient was placed in a prone position.  The lumbar area was marked and prepped with Betadine.  It was entered with a 25-gauge 1-1/2 inch needle and one mL of 1% lidocaine was injected into the skin and subcutaneous tissue.  Then a 17-gauge spinal needle was inserted under fluoroscopic guidance into the Left L4-5 interlaminar space under AP and Lateral imaging.  Once needle tip of approximated the posterior elements, a loss of resistance technique was utilized with lateral imaging.  A positive loss of resistance was obtained and then confirmed by injecting 2 mL's of Omnipaque 180.  Then a solution containing 2 mL's of 40 mg per mL depomedrol and 2 mL's of 1% lidocaine was injected.  The patient tolerated procedure well.  Post procedure instructions were given.  Please se post procedure form.  Itching history Omnipaque. Premedicated with Benadryl 50 mg IM. Patient denies history of anaphylaxis. Will be taking 25-50 mg every 6 hours for the next 24 hours .

## 2013-02-20 NOTE — Patient Instructions (Signed)
Benadryl every 6 hours when necessary 25-50 mg as needed for itching.

## 2013-02-25 ENCOUNTER — Telehealth: Payer: Self-pay

## 2013-02-25 NOTE — Telephone Encounter (Signed)
Patient called and left message with Medical Records saying she needs refills on Axert.  We sent 90 days +1 refill on 02/06/13.  I called the pharmacy and spoke with Jill Alexanders.  He said the patient has plenty of refills on file.  He says she always gets anxious about this Rx.  They will fill it for her.  I called the patient.  She is aware.  She spoke with me for almost 30 minutes about her co-pay.  She wants [place] to file an appeal to get the co-pay lowered.  We have already gotten a prior auth approved, but she wants [place] to get an override for a lower co-pay.  Advised her we will send info they need, but it is ultimately up to the insurance to determine her co-pay.  She verbalized understanding but still wants her co-pay lowered.  I asked her if she knew where we needed to send this information.  She said she only knows it is [facility], does not have a fax number or department/person to address it to.  She said she calls them at [phone].  She said she wants [place] to do a 72 hour Urgent Request for appeal.  Patient said she spoke with Gershon Cull when she called [facility].  She said she will call us back 2-3 times daily until it is approved.  I advised her if she calls numerous times, that may delay the process.  Since it is a 72 hour appeal, I asked her to wait 72 hours before calling to allow time for the process to be done.  Told her I will call tomorrow and see if I can get the correct info as to where the info needs to be sent for Tier Exception Request.  She said she wants the co=pay lowered from $387.00 for 90 days to $187.50.  Again, I advised her this is determined by her insurance company.  She verbalized understanding.

## 2013-02-26 NOTE — Telephone Encounter (Signed)
I called the number provided to me by the patient.  I spoke with [name].  She said this patients plan does not allow co-pay reduction.  She said she does see the note in the system that patient spoke with them, and they advised the patient her prior Berkley Harvey was already approved, so her co-pay was lower than paying cash price.  She is going to fax me the form to complete anyway.  York Spaniel it likely will be declined because her policy states she is not eligible for co-pay reduction.  I will complete and fax back the form after I receive it.  Will then need to wait while insurance determines if outcome.

## 2013-02-27 ENCOUNTER — Encounter: Payer: Self-pay | Admitting: Physical Medicine and Rehabilitation

## 2013-02-27 ENCOUNTER — Encounter: Payer: 59 | Attending: Physical Medicine and Rehabilitation | Admitting: Physical Medicine and Rehabilitation

## 2013-02-27 ENCOUNTER — Other Ambulatory Visit: Payer: Self-pay | Admitting: Neurology

## 2013-02-27 ENCOUNTER — Encounter: Payer: Self-pay | Admitting: Neurology

## 2013-02-27 VITALS — BP 135/71 | HR 114 | Resp 14 | Ht 65.0 in | Wt 135.8 lb

## 2013-02-27 DIAGNOSIS — M47817 Spondylosis without myelopathy or radiculopathy, lumbosacral region: Secondary | ICD-10-CM

## 2013-02-27 DIAGNOSIS — M65839 Other synovitis and tenosynovitis, unspecified forearm: Secondary | ICD-10-CM | POA: Insufficient documentation

## 2013-02-27 DIAGNOSIS — M5137 Other intervertebral disc degeneration, lumbosacral region: Secondary | ICD-10-CM | POA: Insufficient documentation

## 2013-02-27 DIAGNOSIS — M51379 Other intervertebral disc degeneration, lumbosacral region without mention of lumbar back pain or lower extremity pain: Secondary | ICD-10-CM | POA: Insufficient documentation

## 2013-02-27 DIAGNOSIS — Z87442 Personal history of urinary calculi: Secondary | ICD-10-CM | POA: Insufficient documentation

## 2013-02-27 DIAGNOSIS — IMO0001 Reserved for inherently not codable concepts without codable children: Secondary | ICD-10-CM

## 2013-02-27 DIAGNOSIS — G43909 Migraine, unspecified, not intractable, without status migrainosus: Secondary | ICD-10-CM | POA: Insufficient documentation

## 2013-02-27 DIAGNOSIS — G5600 Carpal tunnel syndrome, unspecified upper limb: Secondary | ICD-10-CM | POA: Insufficient documentation

## 2013-02-27 DIAGNOSIS — M654 Radial styloid tenosynovitis [de Quervain]: Secondary | ICD-10-CM | POA: Insufficient documentation

## 2013-02-27 NOTE — Progress Notes (Signed)
Subjective:    Patient ID: Patty Salas, female    DOB: 05/13/1956, 57 y.o.   MRN: [id number]  HPI The patient is a 57 year old female, who presents with left sided back pain . The symptoms got worse about 3 days ago.  The patient complains about severe pain, which radiate up into the left side. Patient denies any radiating symptoms into her LEs. Patient denies numbness and tingling . She describes the pain as constant and sharp . Applying heat, taking medications , changing positions alleviate the symptoms. The patient grades his pain as a  10/10. The patient had a left ESI at L4-5 done on the 22nd of May, she states, that she tolerated the injection, which gave her relief for about 3 days, then she developed pain at the level of L1 on the left side radiating upwards, she also complains about muscle cramps in that area.  Pain Inventory Average Pain 10 Pain Right Now 10 My pain is constant, sharp and stabbing  In the last 24 hours, has pain interfered with the following? General activity 10 Relation with others 10 Enjoyment of life 10 What TIME of day is your pain at its worst? all Sleep (in general) Poor  Pain is worse with: walking, bending, sitting, inactivity, standing and some activites Pain improves with: rest, heat/ice, therapy/exercise, medication and injections Relief from Meds: 3  Mobility walk without assistance use a cane how many minutes can you walk? 5 ability to climb steps?  yes do you drive?  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  no  Physicians involved in your care Any changes since last visit?  no   Family History  Problem Relation Age of  Onset  . Cancer Mother   . Heart disease Father   . Hypertension Father   . Hypertension Brother   . Hypertension Brother    History   Social History  . Marital Status: Married    Spouse Name: N/A    Number of Children: N/A  . Years of Education: N/A   Social History Main Topics  . Smoking status: Never Smoker   . Smokeless tobacco: Never Used  . Alcohol Use: None  . Drug Use: None  . Sexually Active: None   Other Topics Concern  . None   Social History Narrative  . None   Past Surgical History  Procedure Laterality Date  . Abdominal hysterectomy    . Appendectomy    . Cholecystectomy    . Tonsillectomy    . Neck surgery     Past Medical History  Diagnosis Date  . Myalgia and myositis, unspecified   . Fibromyalgia   . Anxiety   . Depression   . Cervical facet syndrome   . Calcifying tendinitis of shoulder   . Carpal tunnel syndrome   . Thoracic radiculopathy    BP 135/71  Pulse 114  Resp 14  Ht 5\' 5"  (1.651 m)  Wt 135 lb 12.8 oz (61.598 kg)  BMI 22.6 kg/m2  SpO2 96%   Review of Systems  Constitutional: Positive for appetite change and unexpected weight change.  Gastrointestinal: Positive for nausea and vomiting.  Musculoskeletal: Positive for back pain.       Spasms  Neurological: Positive  for dizziness, tremors, weakness and numbness.       Tingling  Psychiatric/Behavioral: Positive for confusion and dysphoric mood. The patient is nervous/anxious.        Objective:   Physical Exam  Constitutional: She is oriented to person, place, and time. She appears well-developed and well-nourished.  HENT:  Head: Normocephalic.  Neck: Neck supple.  Musculoskeletal: She exhibits tenderness.  Neurological: She is alert and oriented to person, place, and time.  Skin: Skin is warm and dry.  Psychiatric: She has a normal mood and affect.  Symmetric normal motor tone is noted throughout, mildly increased tone paraspinal at upper L-spine bilateral. Normal  muscle bulk. Muscle testing reveals 5/5 muscle strength of the upper extremity, and 5/5 of the lower extremity. Full range of motion in upper and lower extremities. ROM of spine is  Restricted because of pain, when I tested it, but when the patient moved and got things out of her purse the ROM was sufficient.Extension was almost full. Fine motor movements are normal in both hands. Sensory is intact and symmetric to light touch, pinprick and proprioception. DTR in the upper and lower extremity are present and symmetric 2+. No clonus is noted.  Patient arises from chair with mild difficulty. Narrow based gait with normal arm swing , not extremely antalgic. SLR negative, slump test negative, cross SLR negative.        Assessment & Plan:  1. History of fibromyalgia with myofascial pain and multiple trigger points, 2. Migraine headaches.  3. Lumbar degenerative disk disease.Increased left sided pack pain, radiating upwards in her paraspinal muscles. Advised patient to take her muscle relaxant regularly tid, also to apply heat and maybe her husband can massage the paraspinal muscles. Suggested other medical treatments, but the patient denied this, she stated that she just wanted an increase of her Hydrocodone, because she took more than prescribed and would run out early. I educated the patient that she should take her medication as prescribed, and that I will not fill her Hydrocodone early, but could offer other treatments, which she denies. Her PE did not show significant neurological symptoms. Her pain might be coming from her left kidney/ versus muscle pain 4. History of nephrolithiasis. Today pain close to position of left kidney, advised patient to move up her appointment with her nephrologist, also advised her to go to the ED if this pain increases 5. Right flexor pollicis longus tendonitis  6. Right CTS  7. DeQuervain's Tenosynovitis  PLAN:  1. Continue back exercises as pain allows, continue  with walking program, and applying heat and ice as needed for pain relief.  2. Continue with Norco 5/325, #90 and fentanyl 25 mcg, #10 as prescribed. The long term goal again is to taper the narcs. .  Follow up with [name] in one month.

## 2013-02-27 NOTE — Patient Instructions (Signed)
Please call your nephrologist and try to get an earlier appointment for your left sided  back pain.

## 2013-02-27 NOTE — Telephone Encounter (Signed)
Patient is calling to tell us she needs the prescription # [id number] refilled.  She tells me the last time she had this filled was 07/23/2012.  [facility] [place], [place].  She tells me the pharmacy phone number is:  [phone]. She would like a call back asap.  Her phone number is:  [phone].

## 2013-02-28 ENCOUNTER — Telehealth: Payer: Self-pay

## 2013-02-28 MED ORDER — ZOLMITRIPTAN 5 MG PO TBDP: 5.0000 mg | ORAL_TABLET | Freq: Two times a day (BID) | ORAL | Status: DC | PRN

## 2013-02-28 NOTE — Telephone Encounter (Signed)
Ins is not going to lower patients co-pay.   Patient called the front desk and said she would like to be switched back to Zomig instead due to Axert Price.  Will forward message to provider.

## 2013-02-28 NOTE — Telephone Encounter (Signed)
This is a former [facility] patient who saw Dr Frances Furbish, but has now been assigned to Dr Anne Hahn.  Although a prior Berkley Harvey is approved on file for Axert, she is still unable to afford the co-pay.  She would like to be changed back to Zomig ZMT 5mg .  Okay to change medication?  Please advise.  Thank you.

## 2013-02-28 NOTE — Telephone Encounter (Signed)
I will convert the patient back to Zomig.

## 2013-03-05 ENCOUNTER — Ambulatory Visit: Payer: 59

## 2013-03-10 ENCOUNTER — Ambulatory Visit: Payer: 59 | Admitting: Physical Medicine and Rehabilitation

## 2013-03-10 ENCOUNTER — Ambulatory Visit: Payer: 59 | Admitting: Physical Medicine & Rehabilitation

## 2013-03-14 ENCOUNTER — Encounter: Payer: 59 | Attending: Physical Medicine and Rehabilitation | Admitting: Physical Medicine & Rehabilitation

## 2013-03-14 ENCOUNTER — Encounter: Payer: Self-pay | Admitting: Physical Medicine & Rehabilitation

## 2013-03-14 VITALS — BP 118/56 | HR 104 | Resp 14 | Ht 65.0 in | Wt 138.0 lb

## 2013-03-14 DIAGNOSIS — F418 Other specified anxiety disorders: Secondary | ICD-10-CM

## 2013-03-14 DIAGNOSIS — N200 Calculus of kidney: Secondary | ICD-10-CM

## 2013-03-14 DIAGNOSIS — M797 Fibromyalgia: Secondary | ICD-10-CM

## 2013-03-14 DIAGNOSIS — G43519 Persistent migraine aura without cerebral infarction, intractable, without status migrainosus: Secondary | ICD-10-CM | POA: Insufficient documentation

## 2013-03-14 DIAGNOSIS — M47817 Spondylosis without myelopathy or radiculopathy, lumbosacral region: Secondary | ICD-10-CM

## 2013-03-14 DIAGNOSIS — M65839 Other synovitis and tenosynovitis, unspecified forearm: Secondary | ICD-10-CM | POA: Insufficient documentation

## 2013-03-14 DIAGNOSIS — M65849 Other synovitis and tenosynovitis, unspecified hand: Secondary | ICD-10-CM

## 2013-03-14 DIAGNOSIS — M51379 Other intervertebral disc degeneration, lumbosacral region without mention of lumbar back pain or lower extremity pain: Secondary | ICD-10-CM | POA: Insufficient documentation

## 2013-03-14 DIAGNOSIS — IMO0001 Reserved for inherently not codable concepts without codable children: Secondary | ICD-10-CM

## 2013-03-14 DIAGNOSIS — M47816 Spondylosis without myelopathy or radiculopathy, lumbar region: Secondary | ICD-10-CM

## 2013-03-14 DIAGNOSIS — M5137 Other intervertebral disc degeneration, lumbosacral region: Secondary | ICD-10-CM

## 2013-03-14 DIAGNOSIS — F341 Dysthymic disorder: Secondary | ICD-10-CM

## 2013-03-14 DIAGNOSIS — G5600 Carpal tunnel syndrome, unspecified upper limb: Secondary | ICD-10-CM

## 2013-03-14 DIAGNOSIS — M5136 Other intervertebral disc degeneration, lumbar region: Secondary | ICD-10-CM

## 2013-03-14 DIAGNOSIS — M778 Other enthesopathies, not elsewhere classified: Secondary | ICD-10-CM

## 2013-03-14 MED ORDER — HYDROCODONE-ACETAMINOPHEN 5-325 MG PO TABS: 1.0000 | ORAL_TABLET | Freq: Four times a day (QID) | ORAL | Status: DC | PRN

## 2013-03-14 MED ORDER — FENTANYL 25 MCG/HR TD PT72: 1.0000 | MEDICATED_PATCH | TRANSDERMAL | Status: DC

## 2013-03-14 NOTE — Progress Notes (Signed)
Subjective:    Patient ID: Patty Salas, female    DOB: 01-12-1956, 57 y.o.   MRN: [id number]  HPI  Patty Salas is back regarding her FMS and chronic pain syndrome. She continues with pain in familiar areas and patterns along her neck, shoulder girdle, and back. She is trying to exercise and work on posture, etc as much as possible.   She follows up with urology later this month regarding her renal stones/cysts.   Dr. Wynn Banker performed her ESI in May and she has experienced a 30% drop at least in her low back pain.   She continues on her fentanyl patch and hydrocodone as presribed.   She is under stress as her elderly father just fell and suffered a head injury.     Pain Inventory Average Pain 10 Pain Right Now 10 My pain is constant, sharp and stabbing  In the last 24 hours, has pain interfered with the following? General activity 10 Relation with others 10 Enjoyment of life 10 What TIME of day is your pain at its worst? constant Sleep (in general) Fair  Pain is worse with: walking, bending, sitting, inactivity, standing and some activites Pain improves with: rest, heat/ice, therapy/exercise, pacing activities, medication and injections Relief from Meds: 4  Mobility walk without assistance walk with assistance use a cane use a walker how many minutes can you walk? 5 ability to climb steps?  yes do you drive?  yes needs help with transfers transfers alone Do you have any goals in this area?  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  yes  Neuro/Psych bladder control problems bowel control problems weakness numbness tremor tingling trouble walking spasms dizziness confusion depression anxiety  Prior Studies Any changes since last visit?  no  Physicians involved in your care Any changes since last visit?  yes   Family History   Problem Relation Age of Onset  . Cancer Mother   . Heart disease Father   . Hypertension Father   . Hypertension Brother   . Hypertension Brother    History   Social History  . Marital Status: Married    Spouse Name: N/A    Number of Children: N/A  . Years of Education: N/A   Social History Main Topics  . Smoking status: Never Smoker   . Smokeless tobacco: Never Used  . Alcohol Use: None  . Drug Use: None  . Sexually Active: None   Other Topics Concern  . None   Social History Narrative  . None   Past Surgical History  Procedure Laterality Date  . Abdominal hysterectomy    . Appendectomy    . Cholecystectomy    . Tonsillectomy    . Neck surgery     Past Medical History  Diagnosis Date  . Myalgia and myositis, unspecified   . Fibromyalgia   . Anxiety   . Depression   . Cervical facet syndrome   . Calcifying tendinitis of shoulder   . Carpal tunnel syndrome   . Thoracic radiculopathy    BP 118/56  Pulse 104  Resp 14  Ht 5\' 5"  (1.651 m)  Wt 138 lb (62.596 kg)  BMI 22.96 kg/m2  SpO2 94%     Review of Systems  Constitutional: Positive for unexpected weight change.  HENT: Positive for neck pain.   Gastrointestinal: Positive for nausea, abdominal pain and diarrhea.  Genitourinary: Positive for difficulty urinating.  Neurological: Positive for dizziness, tremors, weakness and numbness.  Psychiatric/Behavioral: Positive for confusion and dysphoric mood. The patient is nervous/anxious.   All other systems reviewed and are negative.       Objective:   Physical Exam Constitutional: She is oriented to person, place, and time. She appears well-developed and well-nourished.  HENT:  Head: Normocephalic and atraumatic.  Eyes: Conjunctivae and EOM are normal. Pupils are equal, round, and reactive to light.  Neck: Neck supple.  Cardiovascular: Normal rate and regular rhythm.  Pulmonary/Chest: Effort normal and breath sounds normal.  Abdominal: Soft. Bowel  sounds are normal.  Musculoskeletal:  Right greater than left mid trap and right splenius capitis and  the bilateral lumbar parapsinal at L5 . She had substantial pain with palpation. She could only bend about 20 degrees forward, extension only to about 5 degrees. Lateral bending and rotation were also limited. She walks with flexed Posture and a head forward position. She continues to have a thoracic levoscoliosis with elevation of her right hemipelvis.  Sensation intact. Tinel's sign equivocal to positive today  Neurological: She is alert and oriented to person, place, and time. She has normal strength with some pain inhibition weakness in the RUE. No cranial nerve deficit or sensory deficit.  Normal gait pattern  Psychiatric: Her speech is normal and behavior is normal. Judgment normal. Her mood appears less anxious as a whole. She's in good spirits.   Assessment & Plan:   ASSESSMENT:  1. History of fibromyalgia with myofascial pain and multiple trigger points.  2. Chronic migraine headaches.  3. Lumbar degenerative disk disease, L4-5  4. History of nephrolithiasis. Cysts on left kidney  5. Right flexor pollicis longus tendonitis  6. Right CTS  7. DeQuervain's Tenosynovitis    PLAN:  1.After informed consent i injected the rightl splenius capitis, bilateral  lumbar paraspinals, and bilateral traps each with 2cc 1% lidocaine (a total of 5 injections were performed)  2. Pt sees urology later this month regarding plan for kidney stones/cysts. 3. Still consider botox injections in the future for migraine rx/prophylaxis.  2. Discussed appropriate back exercises and technique/posture in regard to her back..  3. I refilled her Norco 5/325, #90 and fentanyl 25 mcg, #10. Still the long term goal again is to taper the narcs---the patient wants this as well.  4. She will follow up with my PA in one month.  30 minutes of face to face patient care time were spent during this visit. All questions were  encouraged and answered.

## 2013-03-14 NOTE — Patient Instructions (Signed)
CALL ME WITH ANY PROBLEMS OR QUESTIONS (#[phone]).  HAVE A GOOD DAY  

## 2013-04-14 ENCOUNTER — Encounter: Payer: Self-pay | Admitting: Physical Medicine and Rehabilitation

## 2013-04-14 ENCOUNTER — Encounter: Payer: 59 | Attending: Physical Medicine and Rehabilitation | Admitting: Physical Medicine and Rehabilitation

## 2013-04-14 VITALS — BP 126/64 | HR 102 | Resp 14 | Ht 65.0 in | Wt 142.0 lb

## 2013-04-14 DIAGNOSIS — IMO0002 Reserved for concepts with insufficient information to code with codable children: Secondary | ICD-10-CM

## 2013-04-14 DIAGNOSIS — G43909 Migraine, unspecified, not intractable, without status migrainosus: Secondary | ICD-10-CM | POA: Insufficient documentation

## 2013-04-14 DIAGNOSIS — G43519 Persistent migraine aura without cerebral infarction, intractable, without status migrainosus: Secondary | ICD-10-CM

## 2013-04-14 DIAGNOSIS — Z87442 Personal history of urinary calculi: Secondary | ICD-10-CM | POA: Insufficient documentation

## 2013-04-14 DIAGNOSIS — IMO0001 Reserved for inherently not codable concepts without codable children: Secondary | ICD-10-CM | POA: Insufficient documentation

## 2013-04-14 DIAGNOSIS — G43709 Chronic migraine without aura, not intractable, without status migrainosus: Secondary | ICD-10-CM

## 2013-04-14 DIAGNOSIS — Z79899 Other long term (current) drug therapy: Secondary | ICD-10-CM | POA: Insufficient documentation

## 2013-04-14 DIAGNOSIS — Z5181 Encounter for therapeutic drug level monitoring: Secondary | ICD-10-CM

## 2013-04-14 DIAGNOSIS — M797 Fibromyalgia: Secondary | ICD-10-CM

## 2013-04-14 DIAGNOSIS — M5136 Other intervertebral disc degeneration, lumbar region: Secondary | ICD-10-CM

## 2013-04-14 DIAGNOSIS — M65839 Other synovitis and tenosynovitis, unspecified forearm: Secondary | ICD-10-CM

## 2013-04-14 DIAGNOSIS — M5137 Other intervertebral disc degeneration, lumbosacral region: Secondary | ICD-10-CM | POA: Insufficient documentation

## 2013-04-14 DIAGNOSIS — M51379 Other intervertebral disc degeneration, lumbosacral region without mention of lumbar back pain or lower extremity pain: Secondary | ICD-10-CM | POA: Insufficient documentation

## 2013-04-14 DIAGNOSIS — M47816 Spondylosis without myelopathy or radiculopathy, lumbar region: Secondary | ICD-10-CM

## 2013-04-14 DIAGNOSIS — M47817 Spondylosis without myelopathy or radiculopathy, lumbosacral region: Secondary | ICD-10-CM

## 2013-04-14 DIAGNOSIS — M654 Radial styloid tenosynovitis [de Quervain]: Secondary | ICD-10-CM | POA: Insufficient documentation

## 2013-04-14 DIAGNOSIS — M778 Other enthesopathies, not elsewhere classified: Secondary | ICD-10-CM

## 2013-04-14 MED ORDER — HYDROCODONE-ACETAMINOPHEN 5-325 MG PO TABS: 1.0000 | ORAL_TABLET | Freq: Four times a day (QID) | ORAL | Status: DC | PRN

## 2013-04-14 MED ORDER — FENTANYL 25 MCG/HR TD PT72: 1.0000 | MEDICATED_PATCH | TRANSDERMAL | Status: DC

## 2013-04-14 NOTE — Progress Notes (Signed)
Subjective:    Patient ID: Patty Salas, female    DOB: 01-31-1956, 57 y.o.   MRN: [id number]  HPI The patient is a 57 year old female, who presents with left sided back pain . The symptoms got worse about 3 days ago. The patient complains about severe pain, which radiate up into the left side. Patient denies any radiating symptoms into her LEs. Patient denies numbness and tingling . She describes the pain as constant and sharp . Applying heat, taking medications , changing positions alleviate the symptoms. The patient grades his pain as a 10/10.  The patient had a left ESI at L4-5 done on the 22nd of May, she states, that she tolerated the injection, which gave her relief for about 3 days, then she developed pain at the level of L1 on the left side radiating upwards, she also complains about muscle cramps in that area.  S/P excision of ingrown toenail of left big toe, on 03/20/13, Pain Inventory Average Pain 9 Pain Right Now 9 My pain is constant, sharp and stabbing  In the last 24 hours, has pain interfered with the following? General activity 8 Relation with others 9 Enjoyment of life 9 What TIME of day is your pain at its worst? constant Sleep (in general) Fair  Pain is worse with: walking, bending, sitting, inactivity, standing and some activites Pain improves with: rest, heat/ice, therapy/exercise, medication, injections and imagery, music, crossword puzzle. Relief from Meds: 5  Mobility walk without assistance walk with assistance use a cane use a walker how many minutes can you walk? 5 ability to climb steps?  yes do you drive?  yes needs help with transfers transfers alone Do you have any goals in this area?  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  yes  Neuro/Psych bladder control problems bowel control  problems weakness numbness tremor tingling trouble walking spasms dizziness confusion depression anxiety  Prior Studies Any changes since last visit?  yes foot surgery  Physicians involved in your care Dr Charlsie Merles ([facility])   Family History  Problem Relation Age of Onset  . Cancer Mother   . Heart disease Father   . Hypertension Father   . Hypertension Brother   . Hypertension Brother    History   Social History  . Marital Status: Married    Spouse Name: N/A    Number of Children: N/A  . Years of Education: N/A   Social History Main Topics  . Smoking status: Never Smoker   . Smokeless tobacco: Never Used  . Alcohol Use: None  . Drug Use: None  . Sexually Active: None   Other Topics Concern  . None   Social History Narrative  . None   Past Surgical History  Procedure Laterality Date  . Abdominal hysterectomy    . Appendectomy    . Cholecystectomy    . Tonsillectomy    . Neck surgery    . Foot surgery     Past Medical History  Diagnosis Date  . Myalgia and myositis, unspecified   . Fibromyalgia   . Anxiety   . Depression   . Cervical facet syndrome   . Calcifying tendinitis of shoulder   . Carpal tunnel syndrome   . Thoracic radiculopathy    BP 126/64  Pulse 102  Resp 14  Ht 5\' 5"  (1.651 m)  Wt  142 lb (64.411 kg)  BMI 23.63 kg/m2  SpO2 96%     Review of Systems  Constitutional: Positive for fever, chills, appetite change and unexpected weight change.  HENT: Positive for neck pain.   Gastrointestinal: Positive for nausea, vomiting, abdominal pain, diarrhea and constipation.  Genitourinary: Positive for difficulty urinating.  Neurological: Positive for dizziness, tremors, weakness and numbness.  Psychiatric/Behavioral: Positive for confusion and dysphoric mood. The patient is nervous/anxious.   All other systems reviewed and are negative.       Objective:   Physical Exam Constitutional: She is oriented to person, place,  and time. She appears well-developed and well-nourished.  HENT:  Head: Normocephalic.  Neck: Neck supple.  Musculoskeletal: She exhibits tenderness.  Neurological: She is alert and oriented to person, place, and time.  Skin: Skin is warm and dry.  Psychiatric: She has a normal mood and affect.  Symmetric normal motor tone is noted throughout, mildly increased tone paraspinal at upper L-spine bilateral. Normal muscle bulk. Muscle testing reveals 5/5 muscle strength of the upper extremity, and 5/5 of the lower extremity. Full range of motion in upper and lower extremities. ROM of spine is Restricted because of pain, when I tested it, but when the patient moved and got things out of her purse the ROM was sufficient.Extension was almost full. Fine motor movements are normal in both hands.  Sensory is intact and symmetric to light touch, pinprick and proprioception.  DTR in the upper and lower extremity are present and symmetric 2+. No clonus is noted.  Patient arises from chair with mild difficulty. Narrow based gait with normal arm swing , not extremely antalgic.  SLR negative, slump test negative, cross SLR negative.        Assessment & Plan:  1. History of fibromyalgia with myofascial pain and multiple trigger points,  2. Migraine headaches.  3. Lumbar degenerative disk disease.Stable at today's visit 4. History of nephrolithiasis.  5. Right flexor pollicis longus tendonitis  6. Right CTS  7. DeQuervain's Tenosynovitis  8. S/P excision of ingrown toenail of left big toe, on 03/20/13, healing well PLAN:  1. Continue back exercises as pain allows, continue with walking program, and applying heat and ice as needed for pain relief.  2. Continue with Norco 5/325, #90 and fentanyl 25 mcg, #10 as prescribed. The long term goal again is to taper the narcs.  Follow up with [name] in one month, she wants to discuss Botox injections.Marland Kitchen

## 2013-04-14 NOTE — Patient Instructions (Signed)
Try to stay as active as tolerated, try to look into a [employer] for possible aquatic exercising

## 2013-05-14 ENCOUNTER — Encounter: Payer: 59 | Admitting: Physical Medicine & Rehabilitation

## 2013-05-15 ENCOUNTER — Telehealth: Payer: Self-pay

## 2013-05-15 DIAGNOSIS — M778 Other enthesopathies, not elsewhere classified: Secondary | ICD-10-CM

## 2013-05-15 DIAGNOSIS — M797 Fibromyalgia: Secondary | ICD-10-CM

## 2013-05-15 DIAGNOSIS — G43519 Persistent migraine aura without cerebral infarction, intractable, without status migrainosus: Secondary | ICD-10-CM

## 2013-05-15 DIAGNOSIS — M47816 Spondylosis without myelopathy or radiculopathy, lumbar region: Secondary | ICD-10-CM

## 2013-05-15 DIAGNOSIS — M5136 Other intervertebral disc degeneration, lumbar region: Secondary | ICD-10-CM

## 2013-05-15 MED ORDER — HYDROCODONE-ACETAMINOPHEN 5-325 MG PO TABS: 1.0000 | ORAL_TABLET | Freq: Four times a day (QID) | ORAL | Status: DC | PRN

## 2013-05-15 MED ORDER — FENTANYL 25 MCG/HR TD PT72: 1.0000 | MEDICATED_PATCH | TRANSDERMAL | Status: DC

## 2013-05-15 NOTE — Telephone Encounter (Signed)
Patient had to go to [facility] for some issues.  She just wanted to let us know that's why she could not make her appointment.  Scripts printed for patient and she will make a follow up appointment.

## 2013-05-15 NOTE — Telephone Encounter (Signed)
Patient called to cancel appt for 05/14/13, and was also  some things with medications. Unclear with which ones.

## 2013-05-19 ENCOUNTER — Ambulatory Visit: Payer: 59

## 2013-06-06 ENCOUNTER — Telehealth: Payer: Self-pay

## 2013-06-06 NOTE — Telephone Encounter (Signed)
Patient is requesting a excuse note from [employer].

## 2013-06-06 NOTE — Telephone Encounter (Signed)
done

## 2013-06-09 ENCOUNTER — Ambulatory Visit: Payer: 59

## 2013-06-11 ENCOUNTER — Encounter: Payer: 59 | Attending: Physical Medicine and Rehabilitation | Admitting: Physical Medicine & Rehabilitation

## 2013-06-11 ENCOUNTER — Encounter: Payer: Self-pay | Admitting: Physical Medicine & Rehabilitation

## 2013-06-11 VITALS — BP 126/67 | HR 106 | Resp 16 | Ht 65.0 in | Wt 144.0 lb

## 2013-06-11 DIAGNOSIS — F418 Other specified anxiety disorders: Secondary | ICD-10-CM

## 2013-06-11 DIAGNOSIS — M47816 Spondylosis without myelopathy or radiculopathy, lumbar region: Secondary | ICD-10-CM

## 2013-06-11 DIAGNOSIS — M47817 Spondylosis without myelopathy or radiculopathy, lumbosacral region: Secondary | ICD-10-CM

## 2013-06-11 DIAGNOSIS — F341 Dysthymic disorder: Secondary | ICD-10-CM

## 2013-06-11 DIAGNOSIS — M65839 Other synovitis and tenosynovitis, unspecified forearm: Secondary | ICD-10-CM

## 2013-06-11 DIAGNOSIS — N200 Calculus of kidney: Secondary | ICD-10-CM

## 2013-06-11 DIAGNOSIS — M797 Fibromyalgia: Secondary | ICD-10-CM

## 2013-06-11 DIAGNOSIS — IMO0001 Reserved for inherently not codable concepts without codable children: Secondary | ICD-10-CM

## 2013-06-11 DIAGNOSIS — M778 Other enthesopathies, not elsewhere classified: Secondary | ICD-10-CM

## 2013-06-11 DIAGNOSIS — M5137 Other intervertebral disc degeneration, lumbosacral region: Secondary | ICD-10-CM

## 2013-06-11 DIAGNOSIS — M5136 Other intervertebral disc degeneration, lumbar region: Secondary | ICD-10-CM

## 2013-06-11 DIAGNOSIS — M51379 Other intervertebral disc degeneration, lumbosacral region without mention of lumbar back pain or lower extremity pain: Secondary | ICD-10-CM | POA: Insufficient documentation

## 2013-06-11 DIAGNOSIS — G43519 Persistent migraine aura without cerebral infarction, intractable, without status migrainosus: Secondary | ICD-10-CM

## 2013-06-11 DIAGNOSIS — M51369 Other intervertebral disc degeneration, lumbar region without mention of lumbar back pain or lower extremity pain: Secondary | ICD-10-CM

## 2013-06-11 DIAGNOSIS — G5600 Carpal tunnel syndrome, unspecified upper limb: Secondary | ICD-10-CM

## 2013-06-11 MED ORDER — HYDROCODONE-ACETAMINOPHEN 5-325 MG PO TABS: 1.0000 | ORAL_TABLET | Freq: Four times a day (QID) | ORAL | Status: DC | PRN

## 2013-06-11 MED ORDER — FENTANYL 25 MCG/HR TD PT72: 1.0000 | MEDICATED_PATCH | TRANSDERMAL | Status: DC

## 2013-06-11 NOTE — Patient Instructions (Signed)
CALL ME WITH ANY PROBLEMS OR QUESTIONS (#[phone]).  HAVE A GOOD DAY  

## 2013-06-11 NOTE — Progress Notes (Signed)
Subjective:    Patient ID: Patty Salas, female    DOB: 24-Sep-1956, 57 y.o.   MRN: [id number]  HPI  Jahmya is back regarding her chronic pain and FMS. She has been doing fairly well since I last saw her. Her right toe is improving after the nail was removed. She saw [facility] last month, and things remain stable from that standpoint.   Pain Inventory Average Pain 10 Pain Right Now 10 My pain is sharp, burning and stabbing  In the last 24 hours, has pain interfered with the following? General activity 10 Relation with others 10 Enjoyment of life 10 What TIME of day is your pain at its worst? constant Sleep (in general) Fair  Pain is worse with: walking, bending, sitting, inactivity, standing and some activites Pain improves with: rest, heat/ice, therapy/exercise, medication and injections Relief from Meds: 4  Mobility walk without assistance walk with assistance use a cane use a walker how many minutes can you walk? 5-10 ability to climb steps?  yes do you drive?  yes needs help with transfers transfers alone Do you have any goals in this area?  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  yes  Neuro/Psych bladder control problems weakness numbness tremor tingling trouble walking spasms dizziness confusion depression anxiety  Prior Studies Any changes since last visit?  yes bone scan x-rays CT/MRI nerve study  Physicians involved in your care Any changes since last visit?  yes   Family History  Problem Relation Age of Onset  . Cancer Mother   . Heart disease Father   . Hypertension Father   . Hypertension Brother   . Hypertension Brother    History   Social History  . Marital Status: Married    Spouse Name: N/A    Number of Children: N/A  . Years of Education: N/A   Social History Main Topics  . Smoking status:  Never Smoker   . Smokeless tobacco: Never Used  . Alcohol Use: None  . Drug Use: None  . Sexual Activity: None   Other Topics Concern  . None   Social History Narrative  . None   Past Surgical History  Procedure Laterality Date  . Abdominal hysterectomy    . Appendectomy    . Cholecystectomy    . Tonsillectomy    . Neck surgery    . Foot surgery     Past Medical History  Diagnosis Date  . Myalgia and myositis, unspecified   . Fibromyalgia   . Anxiety   . Depression   . Cervical facet syndrome   . Calcifying tendinitis of shoulder   . Carpal tunnel syndrome   . Thoracic radiculopathy    BP 126/67  Pulse 106  Resp 16  Ht 5\' 5"  (1.651 m)  Wt 144 lb (65.318 kg)  BMI 23.96 kg/m2  SpO2 92%      Review of Systems  Constitutional: Positive for fever, chills and unexpected weight change.  Gastrointestinal: Positive for nausea, vomiting and abdominal pain.  Genitourinary: Positive for difficulty urinating.  Neurological: Positive for dizziness, tremors, weakness, numbness and headaches.  Psychiatric/Behavioral: Positive for confusion and dysphoric mood. The patient is nervous/anxious.   All other systems reviewed and are negative.       Objective:   Physical Exam Constitutional: She is oriented to person, place, and time. She appears well-developed and  well-nourished.  HENT:  Head: Normocephalic and atraumatic.  Eyes: Conjunctivae and EOM are normal. Pupils are equal, round, and reactive to light.  Neck: Neck supple.  Cardiovascular: Normal rate and regular rhythm.  Pulmonary/Chest: Effort normal and breath sounds normal.  Abdominal: Soft. Bowel sounds are normal.  Musculoskeletal:  Right greater than left/right mid trap and right splenius capitis and the bilateral lumbar parapsinal at L5 . She had substantial pain with palpation. She could only bend about 20 degrees forward, extension only to about 5 degrees. Lateral bending and rotation were also limited. She  walks with flexed Posture and a head forward position. She continues to have a thoracic levoscoliosis with elevation of her right hemipelvis. Sensation intact. Tinel's sign equivocal to positive today  Neurological: She is alert and oriented to person, place, and time. She has normal strength with some pain inhibition weakness in the RUE. No cranial nerve deficit or sensory deficit.  Normal gait pattern  Psychiatric: Her speech is normal and behavior is normal. Judgment normal. She is very calm and collected today.  Assessment & Plan:   ASSESSMENT:  1. History of fibromyalgia with myofascial pain and multiple trigger points.  2. Chronic migraine headaches.  3. Lumbar degenerative disk disease, L4-5  4. History of nephrolithiasis. Cysts on left kidney  5. Right flexor pollicis longus tendonitis  6. Right CTS  7. DeQuervain's Tenosynovitis   PLAN:  1.After informed consent i injected the rightl splenius capitis, bilateral lumbar paraspinals, and bilateral traps each with 2cc 1% lidocaine (a total of 5 injections were performed)  2. Continue with kidney stone mgt per [facility]  3. Still consider botox injections in the future for migraine rx/prophylaxis. She is very interested oin these 2. Discussed appropriate back exercises and technique/posture in regard to her back..  3. I refilled her Norco 5/325, #90 and fentanyl 25 mcg, #10.  4. She will follow up with me in one month. 30 minutes of face to face patient care time were spent during this visit. All questions were encouraged and answered.

## 2013-07-03 ENCOUNTER — Ambulatory Visit: Payer: 59

## 2013-07-09 ENCOUNTER — Encounter: Payer: 59 | Attending: Physical Medicine and Rehabilitation | Admitting: Physical Medicine & Rehabilitation

## 2013-07-09 ENCOUNTER — Encounter (INDEPENDENT_AMBULATORY_CARE_PROVIDER_SITE_OTHER): Payer: Self-pay

## 2013-07-09 ENCOUNTER — Encounter: Payer: Self-pay | Admitting: Physical Medicine & Rehabilitation

## 2013-07-09 VITALS — BP 153/62 | HR 114 | Resp 16 | Ht 65.0 in | Wt 141.0 lb

## 2013-07-09 DIAGNOSIS — M51379 Other intervertebral disc degeneration, lumbosacral region without mention of lumbar back pain or lower extremity pain: Secondary | ICD-10-CM | POA: Insufficient documentation

## 2013-07-09 DIAGNOSIS — M797 Fibromyalgia: Secondary | ICD-10-CM

## 2013-07-09 DIAGNOSIS — M5137 Other intervertebral disc degeneration, lumbosacral region: Secondary | ICD-10-CM | POA: Insufficient documentation

## 2013-07-09 DIAGNOSIS — M778 Other enthesopathies, not elsewhere classified: Secondary | ICD-10-CM

## 2013-07-09 DIAGNOSIS — M65839 Other synovitis and tenosynovitis, unspecified forearm: Secondary | ICD-10-CM | POA: Insufficient documentation

## 2013-07-09 DIAGNOSIS — F341 Dysthymic disorder: Secondary | ICD-10-CM

## 2013-07-09 DIAGNOSIS — G43519 Persistent migraine aura without cerebral infarction, intractable, without status migrainosus: Secondary | ICD-10-CM | POA: Insufficient documentation

## 2013-07-09 DIAGNOSIS — IMO0001 Reserved for inherently not codable concepts without codable children: Secondary | ICD-10-CM | POA: Insufficient documentation

## 2013-07-09 DIAGNOSIS — M47817 Spondylosis without myelopathy or radiculopathy, lumbosacral region: Secondary | ICD-10-CM | POA: Insufficient documentation

## 2013-07-09 DIAGNOSIS — M47816 Spondylosis without myelopathy or radiculopathy, lumbar region: Secondary | ICD-10-CM

## 2013-07-09 DIAGNOSIS — M5136 Other intervertebral disc degeneration, lumbar region: Secondary | ICD-10-CM

## 2013-07-09 DIAGNOSIS — F418 Other specified anxiety disorders: Secondary | ICD-10-CM

## 2013-07-09 DIAGNOSIS — G5600 Carpal tunnel syndrome, unspecified upper limb: Secondary | ICD-10-CM

## 2013-07-09 MED ORDER — HYDROCODONE-ACETAMINOPHEN 5-325 MG PO TABS: 1.0000 | ORAL_TABLET | Freq: Four times a day (QID) | ORAL | Status: DC | PRN

## 2013-07-09 MED ORDER — FENTANYL 25 MCG/HR TD PT72: 1.0000 | MEDICATED_PATCH | TRANSDERMAL | Status: DC

## 2013-07-09 NOTE — Patient Instructions (Signed)
CONTINUE TO FOCUS ON YOUR EXERCISE AND INCREASING YOUR LEISURE ACTIVITIES   CALL ME WITH ANY PROBLEMS OR QUESTIONS (#[phone]).  HAVE A GOOD DAY

## 2013-07-09 NOTE — Progress Notes (Signed)
Subjective:    Patient ID: Patty Salas, female    DOB: 06/21/1956, 57 y.o.   MRN: [id number]  HPI  Rupinder is back regarding her chronic pain syndrome. She continues with many of the same issues. Her pain has been a little worse as of late due to the passing of her father two weeks ago but she has managed to decrease her hydrocodone.   She maintains on her fentanyl dose of .   Pain Inventory Average Pain 10 Pain Right Now 10 My pain is constant, sharp, burning and stabbing  In the last 24 hours, has pain interfered with the following? General activity 10 Relation with others 10 Enjoyment of life 10 What TIME of day is your pain at its worst? constant Sleep (in general) Fair  Pain is worse with: walking, bending, sitting, inactivity, standing and some activites Pain improves with: rest, heat/ice, therapy/exercise, medication, injections and church Relief from Meds: 4  Mobility walk without assistance walk with assistance use a cane use a walker how many minutes can you walk? 5-10 ability to climb steps?  yes do you drive?  yes needs help with transfers transfers alone Do you have any goals in this area?  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  yes  Neuro/Psych bladder control problems bowel control problems weakness numbness tremor tingling spasms dizziness confusion depression anxiety  Prior Studies Any changes since last visit?  no  Physicians involved in your care Any changes since last visit?  no   Family History  Problem Relation Age of Onset  . Cancer Mother   . Heart disease Father   . Hypertension Father   . Hypertension Brother   . Hypertension Brother    History   Social History  . Marital Status: Married    Spouse Name: N/A    Number of Children: N/A  . Years of Education: N/A   Social  History Main Topics  . Smoking status: Never Smoker   . Smokeless tobacco: Never Used  . Alcohol Use: None  . Drug Use: None  . Sexual Activity: None   Other Topics Concern  . None   Social History Narrative  . None   Past Surgical History  Procedure Laterality Date  . Abdominal hysterectomy    . Appendectomy    . Cholecystectomy    . Tonsillectomy    . Neck surgery    . Foot surgery     Past Medical History  Diagnosis Date  . Myalgia and myositis, unspecified   . Fibromyalgia   . Anxiety   . Depression   . Cervical facet syndrome   . Calcifying tendinitis of shoulder   . Carpal tunnel syndrome   . Thoracic radiculopathy    BP 153/62  Pulse 114  Resp 16  Ht 5\' 5"  (1.651 m)  Wt 141 lb (63.957 kg)  BMI 23.46 kg/m2  SpO2 91%     Review of Systems  Constitutional: Positive for unexpected weight change.  Gastrointestinal: Positive for nausea, abdominal pain, diarrhea and constipation.  Genitourinary: Positive for difficulty urinating.  Neurological: Positive for dizziness, tremors, weakness and numbness.  Psychiatric/Behavioral: Positive for confusion and dysphoric mood. The patient is nervous/anxious.   All other systems reviewed and are negative.       Objective:   Physical Exam  Constitutional: She is oriented to person, place,  and time. She appears well-developed and well-nourished.  HENT:  Head: Normocephalic and atraumatic.  Eyes: Conjunctivae and EOM are normal. Pupils are equal, round, and reactive to light.  Neck: Neck supple.  Cardiovascular: Normal rate and regular rhythm.  Pulmonary/Chest: Effort normal and breath sounds normal.  Abdominal: Soft. Bowel sounds are normal.  Musculoskeletal:  Right greater than left/right mid trap and right splenius capitis and the bilateral lumbar parapsinal at L5 . She had substantial pain with palpation. She could only bend about 20 degrees forward, extension only to about 5 degrees. Lateral bending and  rotation were also limited. She walks with flexed Posture and a head forward position. She continues to have a thoracic levoscoliosis with elevation of her right hemipelvis. Sensation intact. Tinel's sign equivocal to positive today  Neurological: She is alert and oriented to person, place, and time. She has normal strength with some pain inhibition weakness in the RUE. No cranial nerve deficit or sensory deficit.  Normal gait pattern  Psychiatric: Her speech is normal and behavior is normal. Judgment normal. She is very calm and collected today.    Assessment & Plan:   ASSESSMENT:  1. History of fibromyalgia with myofascial pain and multiple trigger points.  2. Chronic migraine headaches.  3. Lumbar degenerative disk disease, L4-5  4. History of nephrolithiasis. Cysts on left kidney  5. Right flexor pollicis longus tendonitis  6. Right CTS  7. DeQuervain's Tenosynovitis    PLAN:  1.After informed consent i injected four trigger points including the right and left splenius capitis, left lumbar paraspinals, and left trap each with 2cc 1% lidocaine (a total of 5 injections were performed)  2. kidney stone mgt per [facility] 3. Still consider botox injections in the future for migraine rx/prophylaxis. She is very interested oin these  2. Discussed appropriate back exercises and technique/posture in regard to her back..  3. I refilled her Norco 5/325, but reduced to #60 and fentanyl 25 mcg, #10.  4. She will follow up with me in one month. 30 minutes of face to face patient care time were spent during this visit. All questions were encouraged and answered.             Hydrocodone policy change given to patient.

## 2013-07-14 ENCOUNTER — Ambulatory Visit: Payer: 59 | Admitting: Physical Medicine & Rehabilitation

## 2013-07-16 ENCOUNTER — Telehealth: Payer: Self-pay

## 2013-07-16 ENCOUNTER — Telehealth: Payer: Self-pay | Admitting: Physical Medicine & Rehabilitation

## 2013-07-16 NOTE — Telephone Encounter (Signed)
She may pick up or we can rx at botox injection

## 2013-07-16 NOTE — Telephone Encounter (Signed)
Spoke with Patty Salas about her Botox authorization.  Currently, still in process.  Wanted to clarify her next appointment: she is on hydrocodone, but is not scheduled to come back until December.  Will she be able to pick up prescriptions in November, or does she need to plan to come in for a follow up?  We may have the authorization for the Botox before her meds are due, however, she may be changing insurances or having changes to her policy soon, and also has concerns about her cost portion of the medication, so she might wait until she can afford it.

## 2013-07-16 NOTE — Telephone Encounter (Signed)
[facility] called to see if we received a prior authorization form for botox.  Informed them we received the form and it is being processed.  Ref #[id number]

## 2013-07-29 ENCOUNTER — Ambulatory Visit: Payer: Self-pay | Admitting: Neurology

## 2013-08-06 ENCOUNTER — Telehealth: Payer: Self-pay

## 2013-08-06 ENCOUNTER — Encounter: Payer: 59 | Admitting: Physical Medicine & Rehabilitation

## 2013-08-06 DIAGNOSIS — M5136 Other intervertebral disc degeneration, lumbar region: Secondary | ICD-10-CM

## 2013-08-06 DIAGNOSIS — G43519 Persistent migraine aura without cerebral infarction, intractable, without status migrainosus: Secondary | ICD-10-CM

## 2013-08-06 DIAGNOSIS — M797 Fibromyalgia: Secondary | ICD-10-CM

## 2013-08-06 DIAGNOSIS — M778 Other enthesopathies, not elsewhere classified: Secondary | ICD-10-CM

## 2013-08-06 DIAGNOSIS — M47816 Spondylosis without myelopathy or radiculopathy, lumbar region: Secondary | ICD-10-CM

## 2013-08-06 MED ORDER — HYDROCODONE-ACETAMINOPHEN 5-325 MG PO TABS: 1.0000 | ORAL_TABLET | Freq: Four times a day (QID) | ORAL | Status: DC | PRN

## 2013-08-06 NOTE — Telephone Encounter (Signed)
Ok, i  Will refil

## 2013-08-06 NOTE — Telephone Encounter (Signed)
Patient is aware RX is ready for pick-up. °

## 2013-08-06 NOTE — Telephone Encounter (Signed)
Patient canceled appt on 08/05/13. She needs to make a new appt and also is requesting a refill of Hydrocodone.

## 2013-08-31 ENCOUNTER — Other Ambulatory Visit: Payer: Self-pay | Admitting: Neurology

## 2013-08-31 NOTE — Telephone Encounter (Signed)
Patient has cancelled last 2 appts ([facility] pt)

## 2013-09-01 ENCOUNTER — Telehealth: Payer: Self-pay | Admitting: Neurology

## 2013-09-01 ENCOUNTER — Other Ambulatory Visit: Payer: Self-pay

## 2013-09-01 MED ORDER — ZOLMITRIPTAN 5 MG PO TBDP: 5.0000 mg | ORAL_TABLET | ORAL | Status: DC | PRN

## 2013-09-01 NOTE — Telephone Encounter (Signed)
I returned patient's call. We have scheduled patient with Dr. Anne Hahn for September 08 2013 at 9:30 a.m.  Patient is a former Dr. Sandria Manly patient. She has a lot of stuff on her plate. Patient has had significant losses this year. She also goes to pain clinic and counseling. Patient has declined the Rx that has been ordered because she is trying to cut back.

## 2013-09-01 NOTE — Telephone Encounter (Signed)
Patient was switched from Axert to Zomig in May due to cost.

## 2013-09-01 NOTE — Telephone Encounter (Signed)
Ok for the appointment.

## 2013-09-03 ENCOUNTER — Encounter: Payer: Self-pay | Admitting: Physical Medicine & Rehabilitation

## 2013-09-03 ENCOUNTER — Encounter: Payer: 59 | Attending: Physical Medicine and Rehabilitation | Admitting: Physical Medicine & Rehabilitation

## 2013-09-03 VITALS — BP 151/64 | HR 117 | Resp 16 | Ht 65.0 in | Wt 139.0 lb

## 2013-09-03 DIAGNOSIS — G5600 Carpal tunnel syndrome, unspecified upper limb: Secondary | ICD-10-CM | POA: Insufficient documentation

## 2013-09-03 DIAGNOSIS — Z5181 Encounter for therapeutic drug level monitoring: Secondary | ICD-10-CM | POA: Insufficient documentation

## 2013-09-03 DIAGNOSIS — M47816 Spondylosis without myelopathy or radiculopathy, lumbar region: Secondary | ICD-10-CM

## 2013-09-03 DIAGNOSIS — IMO0001 Reserved for inherently not codable concepts without codable children: Secondary | ICD-10-CM | POA: Insufficient documentation

## 2013-09-03 DIAGNOSIS — M65839 Other synovitis and tenosynovitis, unspecified forearm: Secondary | ICD-10-CM | POA: Insufficient documentation

## 2013-09-03 DIAGNOSIS — M797 Fibromyalgia: Secondary | ICD-10-CM

## 2013-09-03 DIAGNOSIS — Z79899 Other long term (current) drug therapy: Secondary | ICD-10-CM | POA: Insufficient documentation

## 2013-09-03 DIAGNOSIS — M5136 Other intervertebral disc degeneration, lumbar region: Secondary | ICD-10-CM

## 2013-09-03 DIAGNOSIS — F341 Dysthymic disorder: Secondary | ICD-10-CM | POA: Insufficient documentation

## 2013-09-03 DIAGNOSIS — F418 Other specified anxiety disorders: Secondary | ICD-10-CM

## 2013-09-03 DIAGNOSIS — M51379 Other intervertebral disc degeneration, lumbosacral region without mention of lumbar back pain or lower extremity pain: Secondary | ICD-10-CM | POA: Insufficient documentation

## 2013-09-03 DIAGNOSIS — M778 Other enthesopathies, not elsewhere classified: Secondary | ICD-10-CM

## 2013-09-03 DIAGNOSIS — M5137 Other intervertebral disc degeneration, lumbosacral region: Secondary | ICD-10-CM | POA: Insufficient documentation

## 2013-09-03 DIAGNOSIS — M47817 Spondylosis without myelopathy or radiculopathy, lumbosacral region: Secondary | ICD-10-CM | POA: Insufficient documentation

## 2013-09-03 DIAGNOSIS — G43519 Persistent migraine aura without cerebral infarction, intractable, without status migrainosus: Secondary | ICD-10-CM | POA: Insufficient documentation

## 2013-09-03 MED ORDER — HYDROCODONE-ACETAMINOPHEN 5-325 MG PO TABS: 1.0000 | ORAL_TABLET | Freq: Four times a day (QID) | ORAL | Status: DC | PRN

## 2013-09-03 MED ORDER — FENTANYL 25 MCG/HR TD PT72: 25.0000 ug | MEDICATED_PATCH | TRANSDERMAL | Status: DC

## 2013-09-03 NOTE — Patient Instructions (Signed)
CALL ME WITH ANY PROBLEMS OR QUESTIONS (#[phone]).  HAVE A GOOD DAY  

## 2013-09-03 NOTE — Progress Notes (Signed)
Subjective:    Patient ID: Patty Salas, female    DOB: [birth year], 57 y.o.   MRN: [id number]  HPI  Patty Salas is back regarding her chronic pain. She has reduced her hydrocodone to #60, and she has been somewhat successful. She continues to have numerous areas of myofascial pain in her posterior neck and shoulder girdle regions. Her low back remains an issue at times but for the most part has been manageable.   Her relationship with her husband remains strained at best. She still is in mourning of her father's death as well. She has regular follow up with psychiatry and psychology in this regard.   Pain Inventory Average Pain 9 Pain Right Now 10 My pain is constant, sharp and stabbing  In the last 24 hours, has pain interfered with the following? General activity 10 Relation with others 9 Enjoyment of life 10 What TIME of day is your pain at its worst? constant Sleep (in general) Fair  Pain is worse with: walking, bending, sitting, inactivity, standing and some activites Pain improves with: rest, heat/ice, therapy/exercise, medication and injections Relief from Meds: 4  Mobility walk without assistance walk with assistance use a cane use a walker ability to climb steps?  yes do you drive?  yes needs help with transfers transfers alone Do you have any goals in this area?  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  yes  Neuro/Psych bladder control problems bowel control problems weakness numbness tremor tingling spasms dizziness confusion depression anxiety  Prior Studies Any changes since last visit?  no  Physicians involved in your care Any changes since last visit?  no   Family History  Problem Relation Age of Onset  . Cancer Mother   . Heart disease Father   . Hypertension Father   . Hypertension Brother   . Hypertension Brother     History   Social History  . Marital Status: Married    Spouse Name: N/A    Number of Children: N/A  . Years of Education: N/A   Social History Main Topics  . Smoking status: Never Smoker   . Smokeless tobacco: Never Used  . Alcohol Use: None  . Drug Use: None  . Sexual Activity: None   Other Topics Concern  . None   Social History Narrative  . None   Past Surgical History  Procedure Laterality Date  . Abdominal hysterectomy    . Appendectomy    . Cholecystectomy    . Tonsillectomy    . Neck surgery    . Foot surgery     Past Medical History  Diagnosis Date  . Myalgia and myositis, unspecified   . Fibromyalgia   . Anxiety   . Depression   . Cervical facet syndrome   . Calcifying tendinitis of shoulder   . Carpal tunnel syndrome   . Thoracic radiculopathy    BP 151/64  Pulse 117  Resp 16  Ht 5\' 5"  (1.651 m)  Wt 139 lb (63.05 kg)  BMI 23.13 kg/m2  SpO2 97%     Review of Systems  Constitutional: Positive for fever and chills.  Gastrointestinal: Positive for nausea, vomiting, abdominal pain, diarrhea and constipation.  Genitourinary: Positive for difficulty urinating.  Musculoskeletal: Positive for arthralgias, gait problem and myalgias.  Neurological: Positive for dizziness, tremors, weakness and numbness.  Psychiatric/Behavioral: Positive for confusion and  dysphoric mood. The patient is nervous/anxious.   All other systems reviewed and are negative.       Objective:   Physical Exam  Constitutional: She is oriented to person, place, and time. She appears well-developed and well-nourished.  HENT:  Head: Normocephalic and atraumatic.  Eyes: Conjunctivae and EOM are normal. Pupils are equal, round, and reactive to light.  Neck: Neck supple.  Cardiovascular: Normal rate and regular rhythm.  Pulmonary/Chest: Effort normal and breath sounds normal.  Abdominal: Soft. Bowel sounds are normal.  Musculoskeletal:  Right greater than left mid trap and  levator scapulae and the right lumbar parapsinal at L4-S1 . She had substantial pain with palpation. She could only bend about 20 degrees forward, extension only to about 5 degrees. Lateral bending and rotation were also limited. She walks with flexed Posture and a head forward position. She continues to have a thoracic levoscoliosis with elevation of her right hemipelvis. Sensation intact. Tinel's sign equivocal to positive today  Neurological: She is alert and oriented to person, place, and time. She has normal strength with some pain inhibition weakness in the RUE. No cranial nerve deficit or sensory deficit.  Normal gait pattern  Psychiatric: Her speech is normal and behavior is normal. Judgment normal. She is very calm and collected today.  Assessment & Plan:   ASSESSMENT:  1. History of fibromyalgia with myofascial pain and multiple trigger points.  2. Chronic migraine headaches.  3. Lumbar degenerative disk disease, L4-5  4. History of nephrolithiasis. Cysts on left kidney  5. Right flexor pollicis longus tendonitis  6. Right CTS  7. DeQuervain's Tenosynovitis    PLAN:  1.After informed consent i injected four trigger points including the right lumbar paraspinals (2), left levator scapulae, and left trap each with 2cc 1% lidocaine (a total of 4 injections were performed)  2. Kidney stone mgt per [facility]  3. Still consider botox for migraine and myofascial pain.  We could pursue early next year. She just needs to let me know when she's ready. 2. Continue with back exercises and technique/posture in regard to her back..  3. I refilled her Norco 5/325 at #60 and fentanyl 25 mcg, #10. Second rx'es provided for next month. 4. She will follow up with me in one month. 30 minutes of face to face patient care time were spent during this visit. All questions were encouraged and answered.

## 2013-09-08 ENCOUNTER — Encounter (INDEPENDENT_AMBULATORY_CARE_PROVIDER_SITE_OTHER): Payer: Self-pay

## 2013-09-08 ENCOUNTER — Ambulatory Visit (INDEPENDENT_AMBULATORY_CARE_PROVIDER_SITE_OTHER): Payer: 59 | Admitting: Neurology

## 2013-09-08 ENCOUNTER — Encounter: Payer: Self-pay | Admitting: Neurology

## 2013-09-08 VITALS — BP 122/70 | HR 72 | Ht 64.5 in | Wt 139.0 lb

## 2013-09-08 DIAGNOSIS — M797 Fibromyalgia: Secondary | ICD-10-CM

## 2013-09-08 DIAGNOSIS — G43519 Persistent migraine aura without cerebral infarction, intractable, without status migrainosus: Secondary | ICD-10-CM

## 2013-09-08 DIAGNOSIS — IMO0001 Reserved for inherently not codable concepts without codable children: Secondary | ICD-10-CM

## 2013-09-08 MED ORDER — ZOLMITRIPTAN 5 MG PO TBDP: 5.0000 mg | ORAL_TABLET | Freq: Two times a day (BID) | ORAL | Status: DC | PRN

## 2013-09-08 NOTE — Patient Instructions (Signed)
Migraine Headache A migraine headache is an intense, throbbing pain on one or both sides of your head. A migraine can last for 30 minutes to several hours. CAUSES  The exact cause of a migraine headache is not always known. However, a migraine may be caused when nerves in the brain become irritated and release chemicals that cause inflammation. This causes pain. SYMPTOMS  Pain on one or both sides of your head.  Pulsating or throbbing pain.  Severe pain that prevents daily activities.  Pain that is aggravated by any physical activity.  Nausea, vomiting, or both.  Dizziness.  Pain with exposure to bright lights, loud noises, or activity.  General sensitivity to bright lights, loud noises, or smells. Before you get a migraine, you may get warning signs that a migraine is coming (aura). An aura may include:  Seeing flashing lights.  Seeing bright spots, halos, or zig-zag lines.  Having tunnel vision or blurred vision.  Having feelings of numbness or tingling.  Having trouble talking.  Having muscle weakness. MIGRAINE TRIGGERS  Alcohol.  Smoking.  Stress.  Menstruation.  Aged cheeses.  Foods or drinks that contain nitrates, glutamate, aspartame, or tyramine.  Lack of sleep.  Chocolate.  Caffeine.  Hunger.  Physical exertion.  Fatigue.  Medicines used to treat chest pain (nitroglycerine), birth control pills, estrogen, and some blood pressure medicines. DIAGNOSIS  A migraine headache is often diagnosed based on:  Symptoms.  Physical examination.  A CT scan or MRI of your head. TREATMENT Medicines may be given for pain and nausea. Medicines can also be given to help prevent recurrent migraines.  HOME CARE INSTRUCTIONS  Only take over-the-counter or prescription medicines for pain or discomfort as directed by your caregiver. The use of long-term narcotics is not recommended.  Lie down in a dark, quiet room when you have a migraine.  Keep a journal  to find out what may trigger your migraine headaches. For example, write down:  What you eat and drink.  How much sleep you get.  Any change to your diet or medicines.  Limit alcohol consumption.  Quit smoking if you smoke.  Get 7 to 9 hours of sleep, or as recommended by your caregiver.  Limit stress.  Keep lights dim if bright lights bother you and make your migraines worse. SEEK IMMEDIATE MEDICAL CARE IF:   Your migraine becomes severe.  You have a fever.  You have a stiff neck.  You have vision loss.  You have muscular weakness or loss of muscle control.  You start losing your balance or have trouble walking.  You feel faint or pass out.  You have severe symptoms that are different from your first symptoms. MAKE SURE YOU:   Understand these instructions.  Will watch your condition.  Will get help right away if you are not doing well or get worse. Document Released: 09/18/2005 Document Revised: 12/11/2011 Document Reviewed: 09/08/2011 [employer] Patient Information 2014 [employer].  

## 2013-09-08 NOTE — Progress Notes (Signed)
Reason for visit: Headache  Patty Salas is a 57 y.o. female  History of present illness:  Patty Salas is a 57 year old right-handed white female with a history of intractable migraine headaches, and a history of a nonorganic clinical examination associated with left-sided numbness and weakness. The patient has been seen previously by Dr. Sandria Manly, and the patient has been tried on a number of different medications, and she has developed quite an impressive list of medication allergies and intolerances. The patient is on fentanyl taking the 25 mcg patch, one patch every 3 days. The patient also takes hydrocodone. The patient is followed by [name] for chronic pain management, and Botox injections have been considered for the headaches previously. In the past, her insurance would not cover the medication, but apparently this is not the case at this point. The patient continues to have frequent headaches, usually on the right side of the head, occasionally on the left. The patient indicates that she will get numbness and weakness on the left side with her headaches, and this happens several times a month. The patient has had 3 headache free days on average and a month. The patient indicates that there are multiple activators for her headache that include weather changes, stress, and odors such as perfumes. The patient does report some neck stiffness. The patient will have visual aura with the headache associated with "mathematical figures" in the vision. The patient may have nausea and vomiting with the headache. The patient has photophobia and phonophobia with headache. The patient also carries the diagnosis of fibromyalgia. The patient returns to this office for an evaluation. The patient lists allergies to Depakote, and Imitrex, but the patient indicates that she can take Zomig.  Past Medical History  Diagnosis Date  . Myalgia and myositis, unspecified   . Fibromyalgia   . Anxiety   . Depression     . Cervical facet syndrome   . Calcifying tendinitis of shoulder   . Carpal tunnel syndrome   . Thoracic radiculopathy   . Headache(784.0)   . Hypertension   . Bipolar affective   . Gout   . Asthma   . Renal calculi   . Diverticulosis     Past Surgical History  Procedure Laterality Date  . Abdominal hysterectomy    . Appendectomy    . Cholecystectomy    . Tonsillectomy    . Neck surgery    . Foot surgery      Family History  Problem Relation Age of Onset  . Cancer Mother   . Migraines Mother   . Heart disease Father   . Hypertension Father   . Hypertension Brother   . Migraines Brother   . Hypertension Brother   . Migraines Brother   . Migraines Daughter     Social history:  reports that she has never smoked. She has never used smokeless tobacco. She reports that she does not drink alcohol or use illicit drugs.  Medications:  Current Outpatient Prescriptions on File Prior to Visit  Medication Sig Dispense Refill  . acyclovir (ZOVIRAX) 800 MG tablet       . BELLADONNA ALK-PHENOBARBITAL PO Take 16.2 mg by mouth. 1 tablet q 4-6 hours as needed      . bisacodyl (DULCOLAX) 5 MG EC tablet Take 10 mg by mouth daily as needed.      . cyclobenzaprine (FLEXERIL) 10 MG tablet Take 10 mg by mouth 3 (three) times daily.      Marland Kitchen  docusate sodium (COLACE) 100 MG capsule Take 100 mg by mouth daily as needed.      Marland Kitchen EPINEPHrine (EPIPEN JR) 0.15 MG/0.3ML injection Inject 0.3 mg into the muscle as needed.      Marland Kitchen estradiol (VIVELLE-DOT) 0.1 MG/24HR Place 1 patch onto the skin 2 (two) times a week.      . fentaNYL (DURAGESIC - DOSED MCG/HR) 25 MCG/HR patch Place 1 patch (25 mcg total) onto the skin every 3 (three) days.  10 patch  0  . furosemide (LASIX) 40 MG tablet Take 40-80 mg by mouth daily as needed.      . gabapentin (NEURONTIN) 300 MG capsule Take 600 mg by mouth at bedtime.      Marland Kitchen HYDROcodone-acetaminophen (NORCO/VICODIN) 5-325 MG per tablet Take 1 tablet by mouth every 6 (six)  hours as needed.  60 tablet  0  . LORazepam (ATIVAN) 1 MG tablet Take 1 mg by mouth 3 (three) times daily.      Marland Kitchen PROAIR HFA 108 (90 BASE) MCG/ACT inhaler Inhale 2 puffs into the lungs as directed.      . promethazine (PHENERGAN) 25 MG tablet every 6 (six) hours as needed.       . Trospium Chloride 60 MG CP24 Take 1 capsule by mouth Daily.      Marland Kitchen lidocaine (LIDODERM) 5 % Place 1 patch onto the skin every 12 (twelve) hours. Remove & Discard patch within 12 hours or as directed by MD      . lidocaine (XYLOCAINE) 2 % solution        No current facility-administered medications on file prior to visit.      Allergies  Allergen Reactions  . Divalproex Sodium Anaphylaxis  . Hydrochlorothiazide Anaphylaxis  . Imitrex [Sumatriptan Succinate] Anaphylaxis  . Mellaril Anaphylaxis  . Olanzapine Anaphylaxis  . Aripiprazole Other (See Comments)    Stiffened muscles  . Aspirin Hives  . Dalmane [Flurazepam Hcl] Other (See Comments)    Stiffened all muscles  . Darifenacin Hydrobromide Er Hives    Hives on back and looked like sunburn  . Metoclopramide Hcl Other (See Comments)    headache  . Seroquel [Quetiapine Fumerate] Other (See Comments)    extreme fatigue and bad dreams  . Statins Hives  . Stelazine Other (See Comments)    Stiffened muscles  . Thorazine [Chlorpromazine Hcl] Other (See Comments)    Stiffened muscles  . Allopurinol   . Barium-Containing Compounds   . Colchicine   . Iohexol      Code: HIVES, Desc: pt broke out in red rash and hives after CT injection on 12/07/09.-pt needs 13 hr prep kit, Onset Date: [id number]   . Latex   . Myrbetriq [Mirabegron]     swelling  . Nsaids   . Penicillins   . Avelox [Moxifloxacin Hcl In Nacl] Nausea And Vomiting  . Diclofenac Rash  . Doxycycline Nausea Only  . E-Mycin [Erythromycin Base] Nausea Only  . Flagyl [Metronidazole Hcl] Nausea And Vomiting  . Lamictal [Lamotrigine] Rash  . Pregabalin   . Risperidone Other (See Comments)    Too  sedating  . Topamax Other (See Comments)    Confusion, tremor, blurred vision    ROS:  Out of a complete 14 system review of symptoms, the patient complains only of the following symptoms, and all other reviewed systems are negative.  Headache Neck stiffness Hand pain, right Left-sided numbness  Blood pressure 122/70, pulse 72, height 5' 4.5" (1.638 m), weight 139 lb (63.05  kg).  Physical Exam  General: The patient is alert and cooperative at the time of the examination.  Head: Pupils are equal, round, and reactive to light. Discs are flat bilaterally.  Neck: The neck is supple, no carotid bruits are noted.  Respiratory: The respiratory examination is clear.  Cardiovascular: The cardiovascular examination reveals a regular rate and rhythm, no obvious murmurs or rubs are noted.  Neuromuscular: The patient lacks about 25 or 30 of full lateral rotation of the cervical spine bilaterally.  Skin: Extremities are without significant edema.  Neurologic Exam  Mental status: The patient is alert and oriented x 3 at the time of the examination.  Cranial nerves: Facial symmetry is present. There is good sensation of the face to pinprick and soft touch on the right, decreased on the left. The patient splits the midline with vibration sensation on the forehead, decreased on the left. The strength of the facial muscles and the muscles to head turning and shoulder shrug are normal bilaterally. Speech is well enunciated, no aphasia or dysarthria is noted. Extraocular movements are full, but with superior gaze, there is divergence of the right eye. Visual fields are full.  Motor: The motor testing reveals 5 over 5 strength of all 4 extremities, with exception that there is giveaway type weakness of the left leg, no true weakness seen. Good symmetric motor tone is noted throughout.  Sensory: Sensory testing is intact to pinprick, soft touch, vibration sensation, and position sense on the right  extremities. On the left side, the patient reports decreased pinprick, vibration sensation, and position sensation on the arm and leg. Extinction is noted on the left.  Coordination: Cerebellar testing reveals good finger-nose-finger and heel-to-shin bilaterally.  Gait and station: Gait is normal. Tandem gait is normal. Romberg is negative. No drift is seen.  Reflexes: Deep tendon reflexes are symmetric, but are depressed bilaterally. Toes are downgoing bilaterally.   Assessment/Plan:  1. Intractable migraine  2. Fibromyalgia  3. Nonorganic examination  The patient has a nonorganic examination with a left-sided sensory deficit associated with giveaway weakness of the left leg, and splitting midline with vibration sensation on the forehead. The patient reports frequent headaches, and she is on daily opiate medications, and hydrocodone if needed. The patient was given a prescription for Zomig. The patient may be a candidate for Botox injections for her headache. The patient wanted a Depacon injection today, but this is listed as an allergy. The patient will followup in 6 months.  Marlan Palau MD 09/08/2013 12:49 PM  [facility] [address] [place], [place] [address]  Phone [phone] Fax [phone]

## 2013-09-12 ENCOUNTER — Telehealth: Payer: Self-pay

## 2013-09-12 NOTE — Telephone Encounter (Signed)
Patient called regarding a fall she had.  She fell in the bathroom and hit her head.  She did black out some, she is dizzy and light headed.  Her left side hurts.  Advised patient to contact pcp or go to urgent care.  Patient did not seem interested in going to be evaluated she just wanted [place] to be aware.

## 2013-09-13 ENCOUNTER — Emergency Department (HOSPITAL_COMMUNITY): Payer: 59

## 2013-09-13 ENCOUNTER — Encounter (HOSPITAL_COMMUNITY): Payer: Self-pay | Admitting: Emergency Medicine

## 2013-09-13 ENCOUNTER — Emergency Department (HOSPITAL_COMMUNITY)
Admission: EM | Admit: 2013-09-13 | Discharge: 2013-09-13 | Disposition: A | Discharge status: Home / Self Care | Payer: 59 | Attending: Emergency Medicine | Admitting: Emergency Medicine

## 2013-09-13 DIAGNOSIS — Z8719 Personal history of other diseases of the digestive system: Secondary | ICD-10-CM | POA: Insufficient documentation

## 2013-09-13 DIAGNOSIS — R1110 Vomiting, unspecified: Secondary | ICD-10-CM | POA: Insufficient documentation

## 2013-09-13 DIAGNOSIS — S060X0A Concussion without loss of consciousness, initial encounter: Secondary | ICD-10-CM

## 2013-09-13 DIAGNOSIS — H538 Other visual disturbances: Secondary | ICD-10-CM | POA: Insufficient documentation

## 2013-09-13 DIAGNOSIS — Z8639 Personal history of other endocrine, nutritional and metabolic disease: Secondary | ICD-10-CM | POA: Insufficient documentation

## 2013-09-13 DIAGNOSIS — F411 Generalized anxiety disorder: Secondary | ICD-10-CM | POA: Insufficient documentation

## 2013-09-13 DIAGNOSIS — Z87442 Personal history of urinary calculi: Secondary | ICD-10-CM | POA: Insufficient documentation

## 2013-09-13 DIAGNOSIS — Y939 Activity, unspecified: Secondary | ICD-10-CM | POA: Insufficient documentation

## 2013-09-13 DIAGNOSIS — Z880 Allergy status to penicillin: Secondary | ICD-10-CM | POA: Insufficient documentation

## 2013-09-13 DIAGNOSIS — S060X9A Concussion with loss of consciousness of unspecified duration, initial encounter: Secondary | ICD-10-CM | POA: Insufficient documentation

## 2013-09-13 DIAGNOSIS — Z91040 Latex allergy status: Secondary | ICD-10-CM | POA: Insufficient documentation

## 2013-09-13 DIAGNOSIS — Z79899 Other long term (current) drug therapy: Secondary | ICD-10-CM | POA: Insufficient documentation

## 2013-09-13 DIAGNOSIS — F319 Bipolar disorder, unspecified: Secondary | ICD-10-CM | POA: Insufficient documentation

## 2013-09-13 DIAGNOSIS — S0993XA Unspecified injury of face, initial encounter: Secondary | ICD-10-CM | POA: Insufficient documentation

## 2013-09-13 DIAGNOSIS — W19XXXA Unspecified fall, initial encounter: Secondary | ICD-10-CM

## 2013-09-13 DIAGNOSIS — Z862 Personal history of diseases of the blood and blood-forming organs and certain disorders involving the immune mechanism: Secondary | ICD-10-CM | POA: Insufficient documentation

## 2013-09-13 DIAGNOSIS — J45909 Unspecified asthma, uncomplicated: Secondary | ICD-10-CM | POA: Insufficient documentation

## 2013-09-13 DIAGNOSIS — I10 Essential (primary) hypertension: Secondary | ICD-10-CM | POA: Insufficient documentation

## 2013-09-13 DIAGNOSIS — Y929 Unspecified place or not applicable: Secondary | ICD-10-CM | POA: Insufficient documentation

## 2013-09-13 DIAGNOSIS — Z8739 Personal history of other diseases of the musculoskeletal system and connective tissue: Secondary | ICD-10-CM | POA: Insufficient documentation

## 2013-09-13 DIAGNOSIS — W1809XA Striking against other object with subsequent fall, initial encounter: Secondary | ICD-10-CM | POA: Insufficient documentation
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
## 2013-09-13 NOTE — ED Notes (Signed)
MD at bedside. 

## 2013-09-13 NOTE — ED Notes (Signed)
Patient more calm now. Crisis line numbers given to patient. Will monitor.

## 2013-09-13 NOTE — ED Notes (Addendum)
Patient states she fell yesterday. Patient hit her head on the floor. Pain is on the left side of head that extends behind left ear. States that she also has pain in left arm and shoulder. Patient states that she did have loc

## 2013-09-17 ENCOUNTER — Encounter: Payer: Self-pay | Admitting: Physical Medicine & Rehabilitation

## 2013-09-17 ENCOUNTER — Encounter (HOSPITAL_BASED_OUTPATIENT_CLINIC_OR_DEPARTMENT_OTHER): Payer: 59 | Admitting: Physical Medicine & Rehabilitation

## 2013-09-17 VITALS — BP 139/72 | HR 106 | Resp 14 | Ht 65.0 in | Wt 137.6 lb

## 2013-09-17 DIAGNOSIS — M797 Fibromyalgia: Secondary | ICD-10-CM

## 2013-09-17 DIAGNOSIS — M65839 Other synovitis and tenosynovitis, unspecified forearm: Secondary | ICD-10-CM

## 2013-09-17 DIAGNOSIS — M5137 Other intervertebral disc degeneration, lumbosacral region: Secondary | ICD-10-CM

## 2013-09-17 DIAGNOSIS — IMO0001 Reserved for inherently not codable concepts without codable children: Secondary | ICD-10-CM

## 2013-09-17 DIAGNOSIS — G43519 Persistent migraine aura without cerebral infarction, intractable, without status migrainosus: Secondary | ICD-10-CM

## 2013-09-17 DIAGNOSIS — F418 Other specified anxiety disorders: Secondary | ICD-10-CM

## 2013-09-17 DIAGNOSIS — M47812 Spondylosis without myelopathy or radiculopathy, cervical region: Secondary | ICD-10-CM

## 2013-09-17 DIAGNOSIS — F341 Dysthymic disorder: Secondary | ICD-10-CM

## 2013-09-17 DIAGNOSIS — M47817 Spondylosis without myelopathy or radiculopathy, lumbosacral region: Secondary | ICD-10-CM

## 2013-09-17 DIAGNOSIS — M5136 Other intervertebral disc degeneration, lumbar region: Secondary | ICD-10-CM

## 2013-09-17 DIAGNOSIS — M47816 Spondylosis without myelopathy or radiculopathy, lumbar region: Secondary | ICD-10-CM

## 2013-09-17 DIAGNOSIS — F0781 Postconcussional syndrome: Secondary | ICD-10-CM | POA: Insufficient documentation

## 2013-09-17 DIAGNOSIS — G5600 Carpal tunnel syndrome, unspecified upper limb: Secondary | ICD-10-CM

## 2013-09-17 DIAGNOSIS — M778 Other enthesopathies, not elsewhere classified: Secondary | ICD-10-CM

## 2013-09-17 DIAGNOSIS — N200 Calculus of kidney: Secondary | ICD-10-CM

## 2013-09-17 MED ORDER — HYDROCODONE-ACETAMINOPHEN 5-325 MG PO TABS: 1.0000 | ORAL_TABLET | Freq: Four times a day (QID) | ORAL | Status: DC | PRN

## 2013-09-17 MED ORDER — FENTANYL 25 MCG/HR TD PT72: 25.0000 ug | MEDICATED_PATCH | TRANSDERMAL | Status: DC

## 2013-09-17 NOTE — Patient Instructions (Signed)
Post-Concussion Syndrome  Post-concussion syndrome describes the symptoms that can occur after a head injury. These symptoms can last from weeks to months.  CAUSES   It is not clear why some head injuries cause post-concussion syndrome. It can occur whether your head injury was mild or severe and whether you were wearing head protection or not.   SYMPTOMS   Memory difficulties.   Dizziness.   Headaches.   Double vision or blurry vision.   Sensitivity to light.   Hearing difficulties.   Depression.   Tiredness.   Weakness.   Difficulty with concentration.   Difficulty sleeping or staying asleep.   Vomiting.  DIAGNOSIS   There is no test to determine whether you have post-concussion syndrome. Your caregiver may order an imaging scan of your brain, such as a CT scan, to check for other problems that may be causing your symptoms (such as severe injury inside your skull).  TREATMENT   Usually, these problems disappear over time without medical care. Your caregiver may prescribe medicine to help ease your symptoms. It is important to follow up with a neurologist to evaluate your recovery and address any lingering symptoms or issues.  HOME CARE INSTRUCTIONS    Only take over-the-counter or prescription medicines for pain, discomfort, or fever as directed by your caregiver. Do not take aspirin. Aspirin can slow blood clotting.   Sleep with your head slightly elevated to help with headaches.   Avoid any situation where there is potential for another head injury (football, hockey, martial arts, horseback riding). Your condition will get worse every time you experience a concussion. You should avoid these activities until you are evaluated by the appropriate follow-up caregivers.   Keep all follow-up appointments as directed by your caregiver.  SEEK IMMEDIATE MEDICAL CARE IF:   You develop confusion or unusual drowsiness.   You cannot wake the injured person.   You develop nausea or persistent, forceful  vomiting.   You feel like you are moving when you are not (vertigo).   You notice the injured person's eyes moving rapidly back and forth. This may be a sign of vertigo.   You have convulsions or faint.   You have severe, persistent headaches that are not relieved by medicine.   You cannot use your arms or legs normally.   Your pupils change size.   You have clear or bloody discharge from the nose or ears.   Your problems are getting worse, not better.  MAKE SURE YOU:   Understand these instructions.   Will watch your condition.   Will get help right away if you are not doing well or get worse.  Document Released: 03/10/2002 Document Revised: 12/11/2011 Document Reviewed: 04/06/2011  ExitCare Patient Information 2014 [employer]
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
## 2013-10-09 ENCOUNTER — Other Ambulatory Visit: Payer: Self-pay | Admitting: *Deleted

## 2013-10-09 DIAGNOSIS — M47816 Spondylosis without myelopathy or radiculopathy, lumbar region: Secondary | ICD-10-CM

## 2013-10-09 DIAGNOSIS — M778 Other enthesopathies, not elsewhere classified: Secondary | ICD-10-CM

## 2013-10-09 DIAGNOSIS — G43519 Persistent migraine aura without cerebral infarction, intractable, without status migrainosus: Secondary | ICD-10-CM

## 2013-10-09 DIAGNOSIS — M5136 Other intervertebral disc degeneration, lumbar region: Secondary | ICD-10-CM

## 2013-10-09 DIAGNOSIS — M797 Fibromyalgia: Secondary | ICD-10-CM

## 2013-10-09 MED ORDER — FENTANYL 25 MCG/HR TD PT72: 25.0000 ug | MEDICATED_PATCH | TRANSDERMAL | Status: DC

## 2013-10-09 MED ORDER — HYDROCODONE-ACETAMINOPHEN 5-325 MG PO TABS: 1.0000 | ORAL_TABLET | Freq: Four times a day (QID) | ORAL | Status: DC | PRN

## 2013-10-09 NOTE — Telephone Encounter (Signed)
RX printed early for controlled medication for the visit with RN on 10/13/13 (to be signed by MD) 

## 2013-10-17 ENCOUNTER — Ambulatory Visit: Payer: 59

## 2013-10-29 ENCOUNTER — Ambulatory Visit: Payer: 59 | Admitting: Physical Medicine & Rehabilitation

## 2013-11-05 ENCOUNTER — Other Ambulatory Visit: Payer: Self-pay | Admitting: *Deleted

## 2013-11-05 DIAGNOSIS — G43519 Persistent migraine aura without cerebral infarction, intractable, without status migrainosus: Secondary | ICD-10-CM

## 2013-11-05 DIAGNOSIS — M797 Fibromyalgia: Secondary | ICD-10-CM

## 2013-11-05 DIAGNOSIS — M778 Other enthesopathies, not elsewhere classified: Secondary | ICD-10-CM

## 2013-11-05 DIAGNOSIS — M47816 Spondylosis without myelopathy or radiculopathy, lumbar region: Secondary | ICD-10-CM

## 2013-11-05 DIAGNOSIS — M5136 Other intervertebral disc degeneration, lumbar region: Secondary | ICD-10-CM

## 2013-11-05 MED ORDER — FENTANYL 25 MCG/HR TD PT72: 25.0000 ug | MEDICATED_PATCH | TRANSDERMAL | Status: DC

## 2013-11-05 MED ORDER — HYDROCODONE-ACETAMINOPHEN 5-325 MG PO TABS: 1.0000 | ORAL_TABLET | Freq: Four times a day (QID) | ORAL | Status: DC | PRN

## 2013-11-05 NOTE — Telephone Encounter (Signed)
RX printed early for controlled medication for the visit with RN on 11/12/13 (to be signed by MD) 

## 2013-11-12 ENCOUNTER — Encounter: Payer: Self-pay | Admitting: *Deleted

## 2013-11-12 ENCOUNTER — Encounter: Payer: 59 | Attending: Physical Medicine & Rehabilitation | Admitting: *Deleted

## 2013-11-12 VITALS — BP 103/61 | HR 89 | Resp 14 | Wt 138.4 lb

## 2013-11-12 DIAGNOSIS — G43909 Migraine, unspecified, not intractable, without status migrainosus: Secondary | ICD-10-CM | POA: Insufficient documentation

## 2013-11-12 DIAGNOSIS — M47812 Spondylosis without myelopathy or radiculopathy, cervical region: Secondary | ICD-10-CM

## 2013-11-12 DIAGNOSIS — M51379 Other intervertebral disc degeneration, lumbosacral region without mention of lumbar back pain or lower extremity pain: Secondary | ICD-10-CM | POA: Insufficient documentation

## 2013-11-12 DIAGNOSIS — F0781 Postconcussional syndrome: Secondary | ICD-10-CM

## 2013-11-12 DIAGNOSIS — M5137 Other intervertebral disc degeneration, lumbosacral region: Secondary | ICD-10-CM | POA: Insufficient documentation

## 2013-11-12 DIAGNOSIS — M542 Cervicalgia: Secondary | ICD-10-CM | POA: Insufficient documentation

## 2013-11-12 DIAGNOSIS — IMO0001 Reserved for inherently not codable concepts without codable children: Secondary | ICD-10-CM | POA: Insufficient documentation

## 2013-11-12 DIAGNOSIS — G5600 Carpal tunnel syndrome, unspecified upper limb: Secondary | ICD-10-CM

## 2013-11-12 DIAGNOSIS — M5136 Other intervertebral disc degeneration, lumbar region: Secondary | ICD-10-CM

## 2013-11-12 DIAGNOSIS — Z87442 Personal history of urinary calculi: Secondary | ICD-10-CM | POA: Insufficient documentation

## 2013-11-12 DIAGNOSIS — W1809XA Striking against other object with subsequent fall, initial encounter: Secondary | ICD-10-CM | POA: Insufficient documentation

## 2013-11-12 DIAGNOSIS — M797 Fibromyalgia: Secondary | ICD-10-CM

## 2013-11-12 DIAGNOSIS — R42 Dizziness and giddiness: Secondary | ICD-10-CM | POA: Insufficient documentation

## 2013-11-12 DIAGNOSIS — M778 Other enthesopathies, not elsewhere classified: Secondary | ICD-10-CM
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
## 2013-11-12 NOTE — Patient Instructions (Signed)
Follow up with Dr Naaman Plummer next month

## 2013-12-02 ENCOUNTER — Telehealth: Payer: Self-pay

## 2013-12-02 NOTE — Telephone Encounter (Signed)
Contacted patient to inform her that Dr. Naaman Plummer said it would be fine to take hyoscyamine instead of bellodonna-phenobarbital. Prescribing MD advised patient to contact our clinic to ask Dr. Naaman Plummer also.

## 2013-12-02 NOTE — Telephone Encounter (Signed)
Patient called to see if it was ok to take hyoscyamine 0.125mg  instead of belladonna-phenobarbital?

## 2013-12-02 NOTE — Telephone Encounter (Signed)
Yes, but she should probably checking with her prescribing md!!

## 2013-12-03 ENCOUNTER — Telehealth: Payer: Self-pay | Admitting: *Deleted

## 2013-12-03 NOTE — Telephone Encounter (Signed)
Received a call from Ivin Booty from [place] PCP office verifying that it was ok to change [place] off the [employer] and I told her Dr Naaman Plummer was in agreement with this.

## 2013-12-05 ENCOUNTER — Encounter: Payer: Self-pay | Admitting: Physical Medicine & Rehabilitation

## 2013-12-05 ENCOUNTER — Encounter: Payer: 59 | Attending: Physical Medicine and Rehabilitation | Admitting: Physical Medicine & Rehabilitation

## 2013-12-05 VITALS — BP 132/72 | HR 114 | Resp 14 | Ht 66.0 in | Wt 141.0 lb

## 2013-12-05 DIAGNOSIS — F418 Other specified anxiety disorders: Secondary | ICD-10-CM

## 2013-12-05 DIAGNOSIS — M51379 Other intervertebral disc degeneration, lumbosacral region without mention of lumbar back pain or lower extremity pain: Secondary | ICD-10-CM

## 2013-12-05 DIAGNOSIS — M65849 Other synovitis and tenosynovitis, unspecified hand: Secondary | ICD-10-CM

## 2013-12-05 DIAGNOSIS — M47816 Spondylosis without myelopathy or radiculopathy, lumbar region: Secondary | ICD-10-CM

## 2013-12-05 DIAGNOSIS — G5600 Carpal tunnel syndrome, unspecified upper limb: Secondary | ICD-10-CM

## 2013-12-05 DIAGNOSIS — H8110 Benign paroxysmal vertigo, unspecified ear: Secondary | ICD-10-CM

## 2013-12-05 DIAGNOSIS — M5136 Other intervertebral disc degeneration, lumbar region: Secondary | ICD-10-CM

## 2013-12-05 DIAGNOSIS — IMO0001 Reserved for inherently not codable concepts without codable children: Secondary | ICD-10-CM

## 2013-12-05 DIAGNOSIS — G43519 Persistent migraine aura without cerebral infarction, intractable, without status migrainosus: Secondary | ICD-10-CM

## 2013-12-05 DIAGNOSIS — F0781 Postconcussional syndrome: Secondary | ICD-10-CM

## 2013-12-05 DIAGNOSIS — M797 Fibromyalgia: Secondary | ICD-10-CM

## 2013-12-05 DIAGNOSIS — M47817 Spondylosis without myelopathy or radiculopathy, lumbosacral region: Secondary | ICD-10-CM

## 2013-12-05 DIAGNOSIS — M47812 Spondylosis without myelopathy or radiculopathy, cervical region: Secondary | ICD-10-CM

## 2013-12-05 DIAGNOSIS — N200 Calculus of kidney: Secondary | ICD-10-CM

## 2013-12-05 DIAGNOSIS — M5137 Other intervertebral disc degeneration, lumbosacral region: Secondary | ICD-10-CM

## 2013-12-05 DIAGNOSIS — M778 Other enthesopathies, not elsewhere classified: Secondary | ICD-10-CM

## 2013-12-05 DIAGNOSIS — F341 Dysthymic disorder: Secondary | ICD-10-CM

## 2013-12-05 DIAGNOSIS — M65839 Other synovitis and tenosynovitis, unspecified forearm: Secondary | ICD-10-CM

## 2013-12-05 MED ORDER — HYDROCODONE-ACETAMINOPHEN 5-325 MG PO TABS: 1.0000 | ORAL_TABLET | Freq: Four times a day (QID) | ORAL | Status: DC | PRN

## 2013-12-05 MED ORDER — FENTANYL 25 MCG/HR TD PT72: 25.0000 ug | MEDICATED_PATCH | TRANSDERMAL | Status: DC

## 2013-12-05 NOTE — Addendum Note (Signed)
Addended by: Jules Schick on: 12/05/2013 11:39 AM   Modules accepted: Orders

## 2013-12-05 NOTE — Progress Notes (Signed)
Subjective:    Patient ID: Patty Salas, female    DOB: [birth year], 58 y.o.   MRN: [id number]  HPI  Patty Salas is back regarding her chronic pain. She is still struggling with symptoms from her concussion--most prominently headaches and dizziness/nausea/loss of balance. She has fallen several more times since I last saw her.   She started on axert again for her headaches, and this seems to be helping a bit more.   She continues to have pain in her lower neck and into her shoulders, particularly on the left, in the areas she most often struggles with spasm and myofascial pain.   Pain Inventory Average Pain 9 Pain Right Now 9 My pain is constant, sharp, burning and stabbing  In the last 24 hours, has pain interfered with the following? General activity 9 Relation with others 9 Enjoyment of life 10 What TIME of day is your pain at its worst? morning, night Sleep (in general) Fair  Pain is worse with: walking, bending, sitting, inactivity, standing and some activites Pain improves with: rest, heat/ice, therapy/exercise, medication and injections Relief from Meds: 5  Mobility walk without assistance walk with assistance use a cane use a walker how many minutes can you walk? 10 ability to climb steps?  no do you drive?  yes needs help with transfers Do you have any goals in this area?  yes  Function not employed: date last employed na I need assistance with the following:  meal prep, household duties and shopping Do you have any goals in this area?  yes  Neuro/Psych bladder control problems bowel control problems weakness numbness tremor tingling spasms dizziness confusion depression anxiety  Prior Studies Any changes since last visit?  no  Physicians involved in your care Any changes since last visit?  no   Family History  Problem Relation Age of Onset  . Cancer Mother   . Migraines Mother   . Heart disease Father   . Hypertension Father   .  Hypertension Brother   . Migraines Brother   . Hypertension Brother   . Migraines Brother   . Migraines Daughter    History   Social History  . Marital Status: Married    Spouse Name: N/A    Number of Children: 1  . Years of Education: COLLEGE1   Occupational History  . HOUSEWIFE    Social History Main Topics  . Smoking status: Never Smoker   . Smokeless tobacco: Never Used  . Alcohol Use: No  . Drug Use: No  . Sexual Activity: None   Other Topics Concern  . None   Social History Narrative  . None   Past Surgical History  Procedure Laterality Date  . Abdominal hysterectomy    . Appendectomy    . Cholecystectomy    . Tonsillectomy    . Neck surgery    . Foot surgery     Past Medical History  Diagnosis Date  . Myalgia and myositis, unspecified   . Fibromyalgia   . Anxiety   . Depression   . Cervical facet syndrome   . Calcifying tendinitis of shoulder   . Carpal tunnel syndrome   . Thoracic radiculopathy   . Headache(784.0)   . Hypertension   . Bipolar affective   . Gout   . Asthma   . Renal calculi   . Diverticulosis    BP 132/72  Pulse 114  Resp 14  Ht 5\' 6"  (1.676 m)  Wt 141 lb (63.957 kg)  BMI 22.77 kg/m2  SpO2 99%  Opioid Risk Score:   Fall Risk Score: Moderate Fall Risk (6-13 points) (pt educated on fall risk, pt given brochure previously)    Review of Systems  Gastrointestinal: Positive for nausea, vomiting, abdominal pain, diarrhea and constipation.  Genitourinary:       Bowel and bladder control problems  Musculoskeletal: Positive for back pain and neck pain.  Skin: Positive for rash.  Neurological: Positive for dizziness, tremors, weakness and numbness.       Tingling, spasms  Psychiatric/Behavioral: Positive for confusion and dysphoric mood. The patient is nervous/anxious.   All other systems reviewed and are negative.       Objective:   Physical Exam  Constitutional: She is oriented to person, place, and time. She  appears well-developed and well-nourished. She is wearing a foam cervical collar.  HENT:  Head: Normocephalic and atraumatic.  Eyes: Conjunctivae and EOM are normal. Pupils are equal, round, and reactive to light.  Neck: Neck supple.  Cardiovascular: Normal rate and regular rhythm.  Pulmonary/Chest: Effort normal and breath sounds normal.  Abdominal: Soft. Bowel sounds are normal.  Musculoskeletal:  Posture rigid due to pain and cervical collar. Did not perform a myofascial exam. Sensation intact. Tinel's sign equivocal to positive today  Neurological: She is alert and oriented to person, place, and time. She has normal strength with some pain inhibition weakness in the RUE. Had difficulty tracking to the right and left---appeared to have nystagmus but closed her eyes to decreased the discomfort. Gait pattern is shuffling. She drifts to the right.  Psychiatric: Her speech is normal and behavior is normal. Judgment normal. She is very calm and collected today.    Assessment & Plan:   ASSESSMENT:  1. History of fibromyalgia with myofascial pain and multiple trigger points.  2. Chronic migraine headaches.  3. Lumbar degenerative disk disease, L4-5  4. History of nephrolithiasis. Cysts on left kidney  5. Right flexor pollicis longus tendonitis  6. Right CTS  7. DeQuervain's Tenosynovitis  8. Fall with concussion and PCS including increased headaches and BPPV    PLAN:  1.Will refer for vestibular rehab for BPPV. Asked her to be extremely careful in the meantime. 2. Kidney stone mgt per [facility]  3. Will arrange botox ultimately when Patty Salas is ready.  (after vestibular rehab) 4. I refilled her Norco 5/325 at #60 which she had taken a few more of because of pain. Also refilled duragesic patch 5. She will follow up with nursing in one month. 30 minutes of face to face patient care time were spent during this visit. All questions were encouraged and answered.

## 2013-12-05 NOTE — Patient Instructions (Signed)
PLEASE CALL ME WITH ANY PROBLEMS OR QUESTIONS (#[phone]).      

## 2013-12-31 ENCOUNTER — Telehealth: Payer: Self-pay

## 2013-12-31 NOTE — Telephone Encounter (Signed)
Patient has a kidney stone.  She just wanted to make [place] aware that she will be taking more medication than normal.

## 2014-01-06 ENCOUNTER — Encounter: Payer: 59 | Admitting: Registered Nurse

## 2014-01-06 ENCOUNTER — Telehealth: Payer: Self-pay | Admitting: Physical Medicine & Rehabilitation

## 2014-01-06 DIAGNOSIS — M5136 Other intervertebral disc degeneration, lumbar region: Secondary | ICD-10-CM

## 2014-01-06 DIAGNOSIS — G43519 Persistent migraine aura without cerebral infarction, intractable, without status migrainosus: Secondary | ICD-10-CM

## 2014-01-06 DIAGNOSIS — M47816 Spondylosis without myelopathy or radiculopathy, lumbar region: Secondary | ICD-10-CM

## 2014-01-06 DIAGNOSIS — M797 Fibromyalgia: Secondary | ICD-10-CM

## 2014-01-06 DIAGNOSIS — M778 Other enthesopathies, not elsewhere classified: Secondary | ICD-10-CM

## 2014-01-06 MED ORDER — HYDROCODONE-ACETAMINOPHEN 5-325 MG PO TABS: 1.0000 | ORAL_TABLET | Freq: Four times a day (QID) | ORAL | Status: DC | PRN

## 2014-01-06 MED ORDER — FENTANYL 25 MCG/HR TD PT72: 25.0000 ug | MEDICATED_PATCH | TRANSDERMAL | Status: DC

## 2014-01-06 NOTE — Telephone Encounter (Signed)
Though this was an appt with NP,  I am forwarding to Dr Naaman Plummer.  Rx printed to sign.

## 2014-01-06 NOTE — Telephone Encounter (Signed)
Can increase to #90

## 2014-01-06 NOTE — Telephone Encounter (Signed)
Cancelled appointment for today.  Migraine and vomiting.  Went to urologist Thursday, who did CT, and can't rule out aortic lymphoma (?), so he is sending her to a hematologist.  Patient very worried about results, seeing psych for coping.    Still having a lot of pain from kidney stones. Requesting refills on Hydrocodone and Fentanyl.  Taking a little extra Norco due to kidney pain, taking 4 per day, has 9 left.  Needs refills ASAP, would like husband to pick up today.

## 2014-01-06 NOTE — Telephone Encounter (Signed)
Patty Salas is insisting on having an increase in the # of hydrocodone and would not take the rx's I had you sign.  Please address when back in the office in am.

## 2014-01-06 NOTE — Telephone Encounter (Signed)
Wants an increase in her Norco for one month.  Says she can't sleep at night due to kidney stone pain.  Dr. Letta Pate told her to take up to 4 per day when she called on-call Friday.

## 2014-01-07 ENCOUNTER — Telehealth: Payer: Self-pay

## 2014-01-07 MED ORDER — HYDROCODONE-ACETAMINOPHEN 5-325 MG PO TABS: 1.0000 | ORAL_TABLET | Freq: Four times a day (QID) | ORAL | Status: DC | PRN

## 2014-01-07 MED ORDER — FENTANYL 25 MCG/HR TD PT72: 25.0000 ug | MEDICATED_PATCH | TRANSDERMAL | Status: DC

## 2014-01-07 NOTE — Telephone Encounter (Signed)
noted 

## 2014-01-07 NOTE — Telephone Encounter (Signed)
Patient wants to make sure she will be able to fill the new Hydrocodone RX today since her last refill was on 3/13.

## 2014-01-07 NOTE — Telephone Encounter (Signed)
rxs fentanyl and hydrocodone reprinted with an increase in the number of hydrocodone from 60 to 90/mo.  Notified Sadi that the medication refills will be available for pick up

## 2014-01-12 ENCOUNTER — Telehealth: Payer: Self-pay | Admitting: Hematology and Oncology

## 2014-01-12 NOTE — Telephone Encounter (Signed)
S/W PATIENT AND GAVE NEW PATIENT APPT FOR 04/22 @ 10:45 W/DR. 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

## 2014-01-12 NOTE — Telephone Encounter (Signed)
C/D 01/12/14 for appt. 01/21/14 °

## 2014-01-21 ENCOUNTER — Ambulatory Visit (HOSPITAL_BASED_OUTPATIENT_CLINIC_OR_DEPARTMENT_OTHER): Payer: 59

## 2014-01-21 ENCOUNTER — Encounter: Payer: Self-pay | Admitting: Hematology and Oncology

## 2014-01-21 ENCOUNTER — Telehealth: Payer: Self-pay | Admitting: Hematology and Oncology

## 2014-01-21 ENCOUNTER — Telehealth: Payer: Self-pay | Admitting: *Deleted

## 2014-01-21 ENCOUNTER — Ambulatory Visit (HOSPITAL_BASED_OUTPATIENT_CLINIC_OR_DEPARTMENT_OTHER): Payer: 59 | Admitting: Hematology and Oncology

## 2014-01-21 ENCOUNTER — Encounter (INDEPENDENT_AMBULATORY_CARE_PROVIDER_SITE_OTHER): Payer: Self-pay

## 2014-01-21 VITALS — BP 123/68 | HR 94 | Temp 98.0°F | Resp 20 | Ht 66.0 in | Wt 140.7 lb

## 2014-01-21 DIAGNOSIS — R591 Generalized enlarged lymph nodes: Secondary | ICD-10-CM

## 2014-01-21 DIAGNOSIS — R599 Enlarged lymph nodes, unspecified: Secondary | ICD-10-CM

## 2014-01-21 LAB — CBC & DIFF AND RETIC
BASO%: 0.9 % (ref 0.0–2.0)
BASOS ABS: 0 10*3/uL (ref 0.0–0.1)
EOS%: 5.6 % (ref 0.0–7.0)
Eosinophils Absolute: 0.3 10*3/uL (ref 0.0–0.5)
HEMATOCRIT: 34.8 % (ref 34.8–46.6)
HEMOGLOBIN: 11.3 g/dL — AB (ref 11.6–15.9)
Immature Retic Fract: 3.3 % (ref 1.60–10.00)
LYMPH#: 1.1 10*3/uL (ref 0.9–3.3)
LYMPH%: 23.8 % (ref 14.0–49.7)
MCH: 29.9 pg (ref 25.1–34.0)
MCHC: 32.5 g/dL (ref 31.5–36.0)
MCV: 92.1 fL (ref 79.5–101.0)
MONO#: 0.5 10*3/uL (ref 0.1–0.9)
MONO%: 11.8 % (ref 0.0–14.0)
NEUT#: 2.6 10*3/uL (ref 1.5–6.5)
NEUT%: 57.9 % (ref 38.4–76.8)
PLATELETS: 295 10*3/uL (ref 145–400)
RBC: 3.78 10*6/uL (ref 3.70–5.45)
RDW: 12.7 % (ref 11.2–14.5)
Retic %: 0.96 % (ref 0.70–2.10)
Retic Ct Abs: 36.29 10*3/uL (ref 33.70–90.70)
WBC: 4.5 10*3/uL (ref 3.9–10.3)

## 2014-01-21 LAB — COMPREHENSIVE METABOLIC PANEL (CC13)
ALBUMIN: 3.7 g/dL (ref 3.5–5.0)
ALK PHOS: 79 U/L (ref 40–150)
ALT: 11 U/L (ref 0–55)
AST: 22 U/L (ref 5–34)
Anion Gap: 12 mEq/L — ABNORMAL HIGH (ref 3–11)
BUN: 11.9 mg/dL (ref 7.0–26.0)
CO2: 35 mEq/L — ABNORMAL HIGH (ref 22–29)
Calcium: 10 mg/dL (ref 8.4–10.4)
Chloride: 95 mEq/L — ABNORMAL LOW (ref 98–109)
Creatinine: 0.7 mg/dL (ref 0.6–1.1)
Glucose: 98 mg/dl (ref 70–140)
POTASSIUM: 3 meq/L — AB (ref 3.5–5.1)
SODIUM: 142 meq/L (ref 136–145)
TOTAL PROTEIN: 8 g/dL (ref 6.4–8.3)

## 2014-01-21 LAB — FERRITIN CHCC: Ferritin: 102 ng/ml (ref 9–269)

## 2014-01-21 LAB — LACTATE DEHYDROGENASE (CC13): LDH: 146 U/L (ref 125–245)

## 2014-01-21 NOTE — Progress Notes (Signed)
[facility] NOTE  Patient Care Team: [name], MD as PCP - General (Family Medicine)  CHIEF COMPLAINTS/PURPOSE OF CONSULTATION:  Nonspecific lymphadenopathy  HISTORY OF PRESENTING ILLNESS:  Patty Salas 58 y.o. female is here because of nonspecific lymphadenopathy detected on recent CT scan. This patient have chronic kidney stone disease. Starting approximately a few months ago, she noted hematuria. On 01/01/2014, she had CT scan without contrast for evaluation of kidney stones and was found to have multiple periaortic lymphadenopathy. The patient denies any recent infection. She does have history of diverticulosis and she thought she may have a bout of diverticulitis recently. The symptoms consist of left lower quadrant pain but since then this has resolved spontaneously.  She denies any bloody diarrhea,or bloating. She has the need to strain for urine regularly. She denies urinary frequency, urgency or hesitancy. She denies any lymphadenopathy elsewhere.  MEDICAL HISTORY:  Past Medical History  Diagnosis Date  . Myalgia and myositis, unspecified   . Fibromyalgia   . Anxiety   . Depression   . Cervical facet syndrome   . Calcifying tendinitis of shoulder   . Carpal tunnel syndrome   . Thoracic radiculopathy   . Headache(784.0)   . Hypertension   . Bipolar affective   . Gout   . Asthma   . Renal calculi   . Diverticulosis   . PTSD (post-traumatic stress disorder)     SURGICAL HISTORY: Past Surgical History  Procedure Laterality Date  . Abdominal hysterectomy    . Appendectomy    . Cholecystectomy    . Tonsillectomy    . Neck surgery    . Foot surgery    . Back surgery    . Cystoscopy    . Abdominal adhesion surgery    . Colonoscopy      SOCIAL HISTORY: History   Social History  . Marital Status: Married    Spouse Name: N/A    Number of Children: 1  . Years of Education: COLLEGE1   Occupational History  . HOUSEWIFE     Social History Main Topics  . Smoking status: Never Smoker   . Smokeless tobacco: Never Used  . Alcohol Use: No  . Drug Use: No  . Sexual Activity: Not on file   Other Topics Concern  . Not on file   Social History Narrative  . No narrative on file    FAMILY HISTORY: Family History  Problem Relation Age of Onset  . Cancer Mother   . Migraines Mother   . Heart disease Father   . Hypertension Father   . Hypertension Brother   . Migraines Brother   . Hypertension Brother   . Migraines Brother   . Migraines Daughter     ALLERGIES:  is allergic to divalproex sodium; hydrochlorothiazide; imitrex; mellaril; olanzapine; aripiprazole; aspirin; dalmane; darifenacin hydrobromide er; metoclopramide hcl; seroquel; statins; stelazine; thorazine; abilify; allopurinol; barium-containing compounds; colchicine; diflucan; duract; iohexol; ivp dye; latex; lyrica; myrbetriq; nsaids; penicillins; reglan; renografin; risperdal; urocit - k; zyprexa; avelox; diclofenac; doxycycline; e-mycin; flagyl; lamictal; pregabalin; risperidone; and topamax.  MEDICATIONS:  Current Outpatient Prescriptions  Medication Sig Dispense Refill  . AXERT 12.5 MG tablet       . bisacodyl (DULCOLAX) 5 MG EC tablet Take 10 mg by mouth daily as needed.      . cyclobenzaprine (FLEXERIL) 10 MG tablet Take 10 mg by mouth 3 (three) times daily.      Marland Kitchen docusate sodium (COLACE) 100 MG capsule Take 100  mg by mouth daily as needed.      Marland Kitchen EPINEPHrine (EPIPEN JR) 0.15 MG/0.3ML injection Inject 0.3 mg into the muscle as needed.      Marland Kitchen estradiol (VIVELLE-DOT) 0.1 MG/24HR Place 1 patch onto the skin 2 (two) times a week.      . fentaNYL (DURAGESIC - DOSED MCG/HR) 25 MCG/HR patch Place 1 patch (25 mcg total) onto the skin every 3 (three) days.  10 patch  0  . furosemide (LASIX) 40 MG tablet Take 40-80 mg by mouth daily as needed.      . gabapentin (NEURONTIN) 300 MG capsule Take 600 mg by mouth at bedtime.      Marland Kitchen  HYDROcodone-acetaminophen (NORCO/VICODIN) 5-325 MG per tablet Take 1 tablet by mouth every 6 (six) hours as needed.  90 tablet  0  . LORazepam (ATIVAN) 1 MG tablet Take 1 mg by mouth 3 (three) times daily.      Marland Kitchen PROAIR HFA 108 (90 BASE) MCG/ACT inhaler Inhale 2 puffs into the lungs as directed.      . promethazine (PHENERGAN) 25 MG tablet every 6 (six) hours as needed.       . Trospium Chloride 60 MG CP24 Take 1 capsule by mouth Daily.      Marland Kitchen acyclovir (ZOVIRAX) 800 MG tablet Take 800 mg by mouth 3 (three) times daily as needed.      . lidocaine (XYLOCAINE) 2 % solution as needed.       . potassium chloride SA (K-DUR,KLOR-CON) 20 MEQ tablet Take 20 mEq by mouth once.      . Vitamin D, Ergocalciferol, (DRISDOL) 50000 UNITS CAPS capsule       . zolmitriptan (ZOMIG-ZMT) 5 MG disintegrating tablet Take 5 mg by mouth as needed for migraine (May use up to two tablets per week).       No current facility-administered medications for this visit.    REVIEW OF SYSTEMS:   Constitutional: Denies fevers, chills or abnormal night sweats Eyes: Denies blurriness of vision, double vision or watery eyes Ears, nose, mouth, throat, and face: Denies mucositis or sore throat Respiratory: Denies cough, dyspnea or wheezes Cardiovascular: Denies palpitation, chest discomfort or lower extremity swelling Skin: Denies abnormal skin rashes Lymphatics: Denies new lymphadenopathy or easy bruising Neurological:Denies numbness, tingling or new weaknesses Behavioral/Psych: Mood is stable, no new changes  All other systems were reviewed with the patient and are negative.  PHYSICAL EXAMINATION: ECOG PERFORMANCE STATUS: 1 - Symptomatic but completely ambulatory  Filed Vitals:   01/21/14 1049  BP: 123/68  Pulse: 94  Temp: 98 F (36.7 C)  Resp: 20   Filed Weights   01/21/14 1049  Weight: 140 lb 11.2 oz (63.821 kg)    GENERAL:alert, no distress and comfortable SKIN: skin color, texture, turgor are normal, no  rashes or significant lesions EYES: normal, conjunctiva are pink and non-injected, sclera clear OROPHARYNX:no exudate, no erythema and lips, buccal mucosa, and tongue normal  NECK: supple, thyroid normal size, non-tender, without nodularity LYMPH:  no palpable lymphadenopathy in the cervical, axillary or inguinal LUNGS: clear to auscultation and percussion with normal breathing effort HEART: regular rate & rhythm and no murmurs and no lower extremity edema ABDOMEN:abdomen soft, non-tender and normal bowel sounds Musculoskeletal:no cyanosis of digits and no clubbing  PSYCH: alert & oriented x 3 with fluent speech NEURO: no focal motor/sensory deficits  LABORATORY DATA:  I have reviewed the data as listed Lab Results  Component Value Date   WBC 4.5 01/21/2014  HGB 11.3* 01/21/2014   HCT 34.8 01/21/2014   MCV 92.1 01/21/2014   PLT 295 01/21/2014    Recent Labs  01/21/14 1150  NA 142  K 3.0*  CO2 35*  GLUCOSE 98  BUN 11.9  CREATININE 0.7  CALCIUM 10.0  PROT 8.0  ALBUMIN 3.7  AST 22  ALT 11  ALKPHOS 29  BILITOT <0.20   ASSESSMENT & PLAN:  #1 recent hematuria #2 history of chronic kidney stones #3 nonspecific lymphadenopathy I recommend PET/CT scan and blood work for evaluation and exclude lymphoma. I will present her case at the next hematology tumor board and I will see her back to review test results.   Orders Placed This Encounter  Procedures  . NM PET Image Initial (PI) Skull Base To Thigh    Standing Status: Future     Number of Occurrences:      Standing Expiration Date: 03/23/2015    Order Specific Question:  Reason for Exam (SYMPTOM  OR DIAGNOSIS REQUIRED)    Answer:  lympadenopathy, abdominal pain, hematuria, lymphoma staging    Order Specific Question:  Is the patient pregnant?    Answer:  No    Order Specific Question:  Preferred imaging location?    Answer:  [facility]  . Comprehensive metabolic panel    Standing Status: Future     Number of  Occurrences: 1     Standing Expiration Date: 01/21/2015  . Lactate dehydrogenase    Standing Status: Future     Number of Occurrences: 1     Standing Expiration Date: 01/21/2015  . CBC & Diff and Retic    Standing Status: Future     Number of Occurrences: 1     Standing Expiration Date: 01/21/2015  . Ferritin    Standing Status: Future     Number of Occurrences: 1     Standing Expiration Date: 01/21/2015    All questions were answered. The patient knows to call the clinic with any problems, questions or concerns. I spent 40 minutes counseling the patient face to face. The total time spent in the appointment was 55 minutes and more than 50% was on counseling.     Heath Lark, MD 01/21/2014 1:10 PM

## 2014-01-21 NOTE — Telephone Encounter (Signed)
Pt notified of potassium results-instructed to take potassium, pt had not been taking potassium and instructed to drink fruit juice per Dr Alvy Bimler. Verbalized understanding

## 2014-01-21 NOTE — Progress Notes (Signed)
Checked in new pt with no financial concerns. °

## 2014-01-21 NOTE — Telephone Encounter (Signed)
gv adn pritnedj appt sched and avs for pt for May....sent pt to lab

## 2014-01-26 ENCOUNTER — Ambulatory Visit (HOSPITAL_COMMUNITY)
Admission: RE | Admit: 2014-01-26 | Discharge: 2014-01-26 | Disposition: A | Discharge status: Home / Self Care | Payer: 59 | Source: Ambulatory Visit | Attending: Hematology and Oncology | Admitting: Hematology and Oncology

## 2014-01-26 DIAGNOSIS — R599 Enlarged lymph nodes, unspecified: Secondary | ICD-10-CM | POA: Insufficient documentation

## 2014-01-26 DIAGNOSIS — R591 Generalized enlarged lymph nodes: Secondary | ICD-10-CM

## 2014-01-26 LAB — GLUCOSE, CAPILLARY: Glucose-Capillary: 85 mg/dL (ref 70–99)

## 2014-01-26 MED ORDER — FLUDEOXYGLUCOSE F - 18 (FDG) INJECTION
7.7000 | Freq: Once | INTRAVENOUS | Status: AC | PRN
  Administered 2014-01-26: 7.7 via INTRAVENOUS

## 2014-01-28 ENCOUNTER — Other Ambulatory Visit: Payer: Self-pay | Admitting: Hematology and Oncology

## 2014-01-28 ENCOUNTER — Telehealth: Payer: Self-pay | Admitting: Hematology and Oncology

## 2014-01-28 ENCOUNTER — Other Ambulatory Visit: Payer: Self-pay | Admitting: *Deleted

## 2014-01-28 DIAGNOSIS — C8589 Other specified types of non-Hodgkin lymphoma, extranodal and solid organ sites: Secondary | ICD-10-CM

## 2014-01-28 NOTE — Telephone Encounter (Signed)
S/w the pt and she is aware of her 02/17/2014

## 2014-01-29 ENCOUNTER — Telehealth: Payer: Self-pay | Admitting: *Deleted

## 2014-01-29 NOTE — Telephone Encounter (Signed)
Pt called back and said she has decided to keep appt w/ Dr. Marlou Starks on 5/14 as scheduled.

## 2014-01-29 NOTE — Telephone Encounter (Signed)
Informed pt of referral to Surgeon for lymph node biopsy.  Next available appt is on 5/14 w/ Dr. Marlou Starks at 1:50 pm.  Pt to arrive at 1:30 pm.    Pt states she has been a pt of Dr. Hassell Done in the past and prefers to see him.  His next available appt is on 5/27.  Pt asks if ok w/ Dr. Alvy Bimler to wait until 5/27 to see Dr. Hassell Done or should she keep sooner appt w/ Dr. Marlou Starks on 5/14?

## 2014-02-02 ENCOUNTER — Telehealth: Payer: Self-pay

## 2014-02-02 DIAGNOSIS — M778 Other enthesopathies, not elsewhere classified: Secondary | ICD-10-CM

## 2014-02-02 DIAGNOSIS — G43519 Persistent migraine aura without cerebral infarction, intractable, without status migrainosus: Secondary | ICD-10-CM

## 2014-02-02 DIAGNOSIS — M797 Fibromyalgia: Secondary | ICD-10-CM

## 2014-02-02 DIAGNOSIS — M5136 Other intervertebral disc degeneration, lumbar region: Secondary | ICD-10-CM

## 2014-02-02 DIAGNOSIS — M47816 Spondylosis without myelopathy or radiculopathy, lumbar region: Secondary | ICD-10-CM

## 2014-02-02 MED ORDER — HYDROCODONE-ACETAMINOPHEN 5-325 MG PO TABS: 1.0000 | ORAL_TABLET | Freq: Four times a day (QID) | ORAL | Status: DC | PRN

## 2014-02-02 NOTE — Telephone Encounter (Signed)
Ok, rx written

## 2014-02-02 NOTE — Telephone Encounter (Signed)
Patient is requesting Hydrocodone #90 for one more month while having procedure done at the [facility]. Please advise.

## 2014-02-03 NOTE — Telephone Encounter (Signed)
Contacted patient to inform her that the Hydrocodone RX is ready for pickup. Patient has a appt on 5/7.

## 2014-02-04 ENCOUNTER — Telehealth: Payer: Self-pay

## 2014-02-04 DIAGNOSIS — M778 Other enthesopathies, not elsewhere classified: Secondary | ICD-10-CM

## 2014-02-04 DIAGNOSIS — M47816 Spondylosis without myelopathy or radiculopathy, lumbar region: Secondary | ICD-10-CM

## 2014-02-04 DIAGNOSIS — G43519 Persistent migraine aura without cerebral infarction, intractable, without status migrainosus: Secondary | ICD-10-CM

## 2014-02-04 DIAGNOSIS — M797 Fibromyalgia: Secondary | ICD-10-CM

## 2014-02-04 DIAGNOSIS — M5136 Other intervertebral disc degeneration, lumbar region: Secondary | ICD-10-CM

## 2014-02-04 MED ORDER — FENTANYL 25 MCG/HR TD PT72: 25.0000 ug | MEDICATED_PATCH | TRANSDERMAL | Status: DC

## 2014-02-04 NOTE — Telephone Encounter (Signed)
Patty Salas will need her fentanyl patches as well.  She will pick up her rx and has an appt with Naaman Plummer 03/06/14.

## 2014-02-04 NOTE — Telephone Encounter (Signed)
Pt. states she is sick (vomitting)  and had to cancel appt on 5/7. She wanted to know if she can just pick up her prescriptions for this month.

## 2014-02-05 ENCOUNTER — Ambulatory Visit: Payer: 59 | Admitting: Hematology and Oncology

## 2014-02-05 ENCOUNTER — Encounter: Payer: 59 | Admitting: Registered Nurse

## 2014-02-12 ENCOUNTER — Ambulatory Visit (INDEPENDENT_AMBULATORY_CARE_PROVIDER_SITE_OTHER): Payer: 59 | Admitting: General Surgery

## 2014-02-12 ENCOUNTER — Encounter (INDEPENDENT_AMBULATORY_CARE_PROVIDER_SITE_OTHER): Payer: Self-pay | Admitting: General Surgery

## 2014-02-12 VITALS — BP 138/82 | HR 72 | Temp 97.4°F | Resp 14 | Ht 61.0 in | Wt 135.0 lb

## 2014-02-12 DIAGNOSIS — R599 Enlarged lymph nodes, unspecified: Secondary | ICD-10-CM | POA: Insufficient documentation

## 2014-02-12 NOTE — Progress Notes (Signed)
Patient ID: Patty Salas, female   DOB: 1956/06/04, 58 y.o.   MRN: [id number]  Chief Complaint  Patient presents with  . New Evaluation    eval pt needs bx for lymphoma staging    HPI Patty Salas is a 58 y.o. female.  We are asked to see the patient in consultation by Dr.Gorsuch to evaluate her for enlarged lymph nodes. The patient is a 58 year old white female who has been experiencing pain in her low back. She has also been having blood in her urine. She is being evaluated by the urologist for this. She has had increased fatigue although she does have a history of fibromyalgia and chronic fatigue. She has been maintaining her weight. She recently had a CT PET that showed a mildly enlarged lymph nodes behind the aorta and in the right groin with some slightly increased metabolic activity. The oncologist have requested a lymph node biopsy to rule out lymphoma  HPI  Past Medical History  Diagnosis Date  . Myalgia and myositis, unspecified   . Fibromyalgia   . Anxiety   . Depression   . Cervical facet syndrome   . Calcifying tendinitis of shoulder   . Carpal tunnel syndrome   . Thoracic radiculopathy   . Headache(784.0)   . Hypertension   . Bipolar affective   . Gout   . Asthma   . Renal calculi   . Diverticulosis   . PTSD (post-traumatic stress disorder)     Past Surgical History  Procedure Laterality Date  . Abdominal hysterectomy    . Appendectomy    . Cholecystectomy    . Tonsillectomy    . Neck surgery    . Foot surgery    . Back surgery    . Cystoscopy    . Abdominal adhesion surgery    . Colonoscopy      Family History  Problem Relation Age of Onset  . Cancer Mother   . Migraines Mother   . Heart disease Father   . Hypertension Father   . Hypertension Brother   . Migraines Brother   . Hypertension Brother   . Migraines Brother   . Migraines Daughter     Social History History  Substance Use Topics  . Smoking status: Never Smoker   . Smokeless  tobacco: Never Used  . Alcohol Use: No    Allergies  Allergen Reactions  . Divalproex Sodium Anaphylaxis  . Hydrochlorothiazide Anaphylaxis    Pt loses facial movement uncontrolled;   . Imitrex [Sumatriptan Succinate] Anaphylaxis  . Mellaril Anaphylaxis  . Olanzapine Anaphylaxis  . Aripiprazole Other (See Comments)    Stiffened muscles  . Aspirin Hives  . Dalmane [Flurazepam Hcl] Other (See Comments)    Stiffened all muscles  . Darifenacin Hydrobromide Er Hives    Hives on back and looked like sunburn  . Metoclopramide Hcl Other (See Comments)    headache  . Seroquel [Quetiapine Fumerate] Other (See Comments)    extreme fatigue and bad dreams  . Statins Hives  . Stelazine Other (See Comments)    Stiffened muscles  . Thorazine [Chlorpromazine Hcl] Other (See Comments)    Stiffened muscles  . Abilify [Aripiprazole]   . Allopurinol   . Barium-Containing Compounds   . Colchicine   . Diflucan [Fluconazole]   . Duract [Bromfenac]   . Iohexol      Code: HIVES, Desc: pt broke out in red rash and hives after CT injection on 12/07/09.-pt needs 13 hr prep kit,  Onset Date: [id number]   . Ivp Dye [Iodinated Diagnostic Agents]   . Latex   . Lyrica [Pregabalin]   . Myrbetriq [Mirabegron]     swelling  . Nsaids   . Penicillins   . Reglan [Metoclopramide]   . Renografin [Diatrizoate]   . Risperdal [Risperidone]   . Urocit - K [Potassium Citrate]   . Zyprexa [Olanzapine]   . Avelox [Moxifloxacin Hcl In Nacl] Nausea And Vomiting  . Diclofenac Rash  . Doxycycline Nausea Only  . E-Mycin [Erythromycin Base] Nausea Only  . Flagyl [Metronidazole Hcl] Nausea And Vomiting  . Lamictal [Lamotrigine] Rash  . Pregabalin   . Risperidone Other (See Comments)    Too sedating  . Topamax Other (See Comments)    Confusion, tremor, blurred vision    Current Outpatient Prescriptions  Medication Sig Dispense Refill  . acyclovir (ZOVIRAX) 800 MG tablet Take 800 mg by mouth 3 (three) times daily  as needed.      . AXERT 12.5 MG tablet       . bisacodyl (DULCOLAX) 5 MG EC tablet Take 10 mg by mouth daily as needed.      . cyclobenzaprine (FLEXERIL) 10 MG tablet Take 10 mg by mouth 3 (three) times daily.      Marland Kitchen docusate sodium (COLACE) 100 MG capsule Take 100 mg by mouth daily as needed.      Marland Kitchen EPINEPHrine (EPIPEN JR) 0.15 MG/0.3ML injection Inject 0.3 mg into the muscle as needed.      Marland Kitchen estradiol (VIVELLE-DOT) 0.1 MG/24HR Place 1 patch onto the skin 2 (two) times a week.      . fentaNYL (DURAGESIC - DOSED MCG/HR) 25 MCG/HR patch Place 1 patch (25 mcg total) onto the skin every 3 (three) days.  10 patch  0  . furosemide (LASIX) 40 MG tablet Take 40-80 mg by mouth daily as needed.      . gabapentin (NEURONTIN) 300 MG capsule Take 600 mg by mouth at bedtime.      Marland Kitchen HYDROcodone-acetaminophen (NORCO/VICODIN) 5-325 MG per tablet Take 1 tablet by mouth every 6 (six) hours as needed.  90 tablet  0  . lidocaine (XYLOCAINE) 2 % solution as needed.       Marland Kitchen LORazepam (ATIVAN) 1 MG tablet Take 1 mg by mouth 3 (three) times daily.      . potassium chloride SA (K-DUR,KLOR-CON) 20 MEQ tablet Take 20 mEq by mouth once.      Marland Kitchen PROAIR HFA 108 (90 BASE) MCG/ACT inhaler Inhale 2 puffs into the lungs as directed.      . promethazine (PHENERGAN) 25 MG tablet every 6 (six) hours as needed.       . Trospium Chloride 60 MG CP24 Take 1 capsule by mouth Daily.      . Vitamin D, Ergocalciferol, (DRISDOL) 50000 UNITS CAPS capsule       . zolmitriptan (ZOMIG-ZMT) 5 MG disintegrating tablet Take 5 mg by mouth as needed for migraine (May use up to two tablets per week).       No current facility-administered medications for this visit.    Review of Systems Review of Systems  Constitutional: Positive for activity change and fatigue.  HENT: Negative.   Eyes: Positive for visual disturbance.  Respiratory: Negative.   Cardiovascular: Negative.   Gastrointestinal: Positive for nausea and abdominal pain.  Endocrine:  Negative.   Genitourinary: Positive for hematuria.  Musculoskeletal: Positive for back pain and neck stiffness.  Allergic/Immunologic: Positive for environmental allergies.  Neurological:  Positive for weakness and light-headedness.  Hematological: Negative.   Psychiatric/Behavioral: Negative.     Blood pressure 138/82, pulse 72, temperature 97.4 F (36.3 C), temperature source Temporal, resp. rate 14, height 5' 1"  (1.549 m), weight 135 lb (61.236 kg).  Physical Exam Physical Exam  Constitutional: She is oriented to person, place, and time. She appears well-developed and well-nourished.  HENT:  Head: Normocephalic and atraumatic.  Eyes: Conjunctivae and EOM are normal. Pupils are equal, round, and reactive to light.  Neck: Normal range of motion. Neck supple.  Cardiovascular: Normal rate, regular rhythm and normal heart sounds.   Pulmonary/Chest: Effort normal and breath sounds normal.  Abdominal: Soft. Bowel sounds are normal.  Musculoskeletal: Normal range of motion.  There appeared to be some mildly enlarged lymph nodes in the right groin that are tender to palpation. There is no palpable axillary or supraclavicular or cervical lymphadenopathy.  Lymphadenopathy:    She has no cervical adenopathy.  Neurological: She is alert and oriented to person, place, and time.  Skin: Skin is warm and dry.  Psychiatric: She has a normal mood and affect. Her behavior is normal.    Data Reviewed As above  Assessment    The patient appears to have some mildly abnormal lymph nodes in the right groin. Request has been made for [place] to biopsy one of these lymph nodes for a lymphoma workup. I have discussed with her in detail the risks and benefits of the operation to do this as well as some of the technical aspects and she understands and wishes to proceed     Plan    Plan for right groin lymph node biopsy        Luella Cook III 02/12/2014, 2:35 PM

## 2014-02-12 NOTE — Patient Instructions (Signed)
Plan for right groin lymph node biopsy

## 2014-02-16 ENCOUNTER — Telehealth: Payer: Self-pay | Admitting: Hematology and Oncology

## 2014-02-16 ENCOUNTER — Telehealth: Payer: Self-pay | Admitting: *Deleted

## 2014-02-16 ENCOUNTER — Other Ambulatory Visit: Payer: Self-pay | Admitting: Hematology and Oncology

## 2014-02-16 NOTE — Telephone Encounter (Signed)
Informed pt of appt changed to 6/19 at 12 noon.  She verbalized understanding.

## 2014-02-16 NOTE — Telephone Encounter (Signed)
lvm for pt regarding to 5.19 appt moved to June 11am

## 2014-02-16 NOTE — Telephone Encounter (Signed)
Let's reschedule to 6/11 at 1 pm, I will place a POF

## 2014-02-16 NOTE — Telephone Encounter (Signed)
Pt cannot make appt on 6/11.  Can she see Dr. Alvy Bimler the week of June 15th, any day except for the 16th?

## 2014-02-16 NOTE — Telephone Encounter (Signed)
Pt reports she is not able to get her Biopsy done until 6/05.  Asks if she still needs to come in to see Dr. Alvy Bimler tomorrow as scheduled?  Should this appt be moved to after her biopsy?

## 2014-02-16 NOTE — Telephone Encounter (Signed)
6/19 at 12 pm

## 2014-02-16 NOTE — Telephone Encounter (Signed)
returned pt call adn r/s appt to 6.19 .Marland Kitchen..pt already aware

## 2014-02-17 ENCOUNTER — Ambulatory Visit: Payer: 59 | Admitting: Hematology and Oncology

## 2014-02-20 ENCOUNTER — Encounter: Payer: Self-pay | Admitting: Physical Medicine & Rehabilitation

## 2014-02-20 ENCOUNTER — Other Ambulatory Visit: Payer: Self-pay | Admitting: Physical Medicine & Rehabilitation

## 2014-02-20 ENCOUNTER — Encounter: Payer: 59 | Attending: Physical Medicine and Rehabilitation | Admitting: Physical Medicine & Rehabilitation

## 2014-02-20 DIAGNOSIS — M65839 Other synovitis and tenosynovitis, unspecified forearm: Secondary | ICD-10-CM

## 2014-02-20 DIAGNOSIS — Z5181 Encounter for therapeutic drug level monitoring: Secondary | ICD-10-CM

## 2014-02-20 DIAGNOSIS — M51379 Other intervertebral disc degeneration, lumbosacral region without mention of lumbar back pain or lower extremity pain: Secondary | ICD-10-CM

## 2014-02-20 DIAGNOSIS — M65849 Other synovitis and tenosynovitis, unspecified hand: Secondary | ICD-10-CM

## 2014-02-20 DIAGNOSIS — M797 Fibromyalgia: Secondary | ICD-10-CM

## 2014-02-20 DIAGNOSIS — IMO0001 Reserved for inherently not codable concepts without codable children: Secondary | ICD-10-CM

## 2014-02-20 DIAGNOSIS — G5600 Carpal tunnel syndrome, unspecified upper limb: Secondary | ICD-10-CM

## 2014-02-20 DIAGNOSIS — M47817 Spondylosis without myelopathy or radiculopathy, lumbosacral region: Secondary | ICD-10-CM

## 2014-02-20 DIAGNOSIS — M5136 Other intervertebral disc degeneration, lumbar region: Secondary | ICD-10-CM

## 2014-02-20 DIAGNOSIS — Z79899 Other long term (current) drug therapy: Secondary | ICD-10-CM

## 2014-02-20 DIAGNOSIS — M47816 Spondylosis without myelopathy or radiculopathy, lumbar region: Secondary | ICD-10-CM

## 2014-02-20 DIAGNOSIS — M5137 Other intervertebral disc degeneration, lumbosacral region: Secondary | ICD-10-CM

## 2014-02-20 DIAGNOSIS — M778 Other enthesopathies, not elsewhere classified: Secondary | ICD-10-CM

## 2014-02-20 DIAGNOSIS — H8110 Benign paroxysmal vertigo, unspecified ear: Secondary | ICD-10-CM

## 2014-02-20 DIAGNOSIS — F0781 Postconcussional syndrome: Secondary | ICD-10-CM

## 2014-02-20 DIAGNOSIS — G43519 Persistent migraine aura without cerebral infarction, intractable, without status migrainosus: Secondary | ICD-10-CM

## 2014-02-20 MED ORDER — FENTANYL 25 MCG/HR TD PT72: 25.0000 ug | MEDICATED_PATCH | TRANSDERMAL | Status: DC

## 2014-02-20 MED ORDER — HYDROCODONE-ACETAMINOPHEN 5-325 MG PO TABS: 1.0000 | ORAL_TABLET | Freq: Four times a day (QID) | ORAL | Status: DC | PRN

## 2014-02-20 NOTE — Progress Notes (Signed)
Subjective:    Patient ID: Patty Salas, female    DOB: [birth year], 58 y.o.   MRN: [id number]  HPI  Patty Salas is back regarding her chronic pain. She has managed fairly well since our last visit. Dr. Alvy Bimler has been seeing the patient regarding her lymphadenopathy. PET scan was notable for activity in numerous areas suggestive of a lymphoproliferative process. She has seen Dr. Marlou Starks for her lymph node assessment. He plans to perform a lymph node bx on 6/5. She also has oncology She hasn't gone to vestibular rehab because of the other medical issues she's been dealing with.  She was approved for botox for migraine headaches. The botox needs to be shipped to our office.  She maintains on norco/fentanyl for pain control. Patty Salas has tried to remain active with exercise and stretching. She actually as improve her pelvic and low back ROM.   Pain Inventory Average Pain 9 Pain Right Now 10 My pain is constant, sharp, burning and stabbing  In the last 24 hours, has pain interfered with the following? General activity 9 Relation with others 10 Enjoyment of life 10 What TIME of day is your pain at its worst? all Sleep (in general) Fair  Pain is worse with: walking, bending, sitting, inactivity, standing and some activites Pain improves with: rest, heat/ice, therapy/exercise, medication and injections Relief from Meds: 4  Mobility walk without assistance use a cane how many minutes can you walk? 5-10 ability to climb steps?  yes do you drive?  yes  Function not employed: date last employed . I need assistance with the following:  meal prep, household duties and shopping  Neuro/Psych bladder control problems bowel control problems weakness numbness tremor tingling trouble walking spasms dizziness confusion depression anxiety  Prior Studies Any changes since last visit?  yes  Physicians involved in your care Any changes since last visit?  no   Family History  Problem  Relation Age of Onset  . Cancer Mother   . Migraines Mother   . Heart disease Father   . Hypertension Father   . Hypertension Brother   . Migraines Brother   . Hypertension Brother   . Migraines Brother   . Migraines Daughter    History   Social History  . Marital Status: Married    Spouse Name: N/A    Number of Children: 1  . Years of Education: COLLEGE1   Occupational History  . HOUSEWIFE    Social History Main Topics  . Smoking status: Never Smoker   . Smokeless tobacco: Never Used  . Alcohol Use: No  . Drug Use: No  . Sexual Activity: None   Other Topics Concern  . None   Social History Narrative  . None   Past Surgical History  Procedure Laterality Date  . Abdominal hysterectomy    . Appendectomy    . Cholecystectomy    . Tonsillectomy    . Neck surgery    . Foot surgery    . Back surgery    . Cystoscopy    . Abdominal adhesion surgery    . Colonoscopy     Past Medical History  Diagnosis Date  . Myalgia and myositis, unspecified   . Fibromyalgia   . Anxiety   . Depression   . Cervical facet syndrome   . Calcifying tendinitis of shoulder   . Carpal tunnel syndrome   . Thoracic radiculopathy   . Headache(784.0)   . Hypertension   . Bipolar affective   .  Gout   . Asthma   . Renal calculi   . Diverticulosis   . PTSD (post-traumatic stress disorder)    There were no vitals taken for this visit.  Opioid Risk Score:   Fall Risk Score: Moderate Fall Risk (6-13 points) (educated and handout given for fall prevention at home at previous visit) Review of Systems  Constitutional: Positive for chills and unexpected weight change.  Gastrointestinal: Positive for nausea, vomiting, abdominal pain, diarrhea and constipation.  Genitourinary:       Bladder control problems  Musculoskeletal: Positive for gait problem and myalgias.       Spasms  Skin: Positive for rash.  Neurological: Positive for dizziness, tremors, weakness and numbness.        Tingling  Psychiatric/Behavioral: Positive for confusion and dysphoric mood. The patient is nervous/anxious.   All other systems reviewed and are negative.      Objective:   Physical Exam Constitutional: She is oriented to person, place, and time. She appears well-developed and well-nourished. She is wearing a foam cervical collar.  HENT:  Head: Normocephalic and atraumatic.  Eyes: Conjunctivae and EOM are normal. Pupils are equal, round, and reactive to light.  Neck: Neck supple.  Cardiovascular: Normal rate and regular rhythm.  Pulmonary/Chest: Effort normal and breath sounds normal.  Abdominal: Soft. Bowel sounds are normal.  Musculoskeletal:  Posture rigid due to pain and cervical collar. Did not perform a myofascial exam. Sensation intact. Tinel's sign equivocal to positive today  Neurological: She is alert and oriented to person, place, and time. She has normal strength with some pain inhibition weakness in the RUE. Had difficulty tracking to the right and left---appeared to have nystagmus but closed her eyes to decreased the discomfort.  Gait pattern is shuffling. She drifts to the right.  Psychiatric: Her speech is normal and behavior is normal. Judgment normal. She is very calm and collected today.  Assessment & Plan:   ASSESSMENT:  1. History of fibromyalgia with myofascial pain and multiple trigger points.  2. Chronic migraine headaches.  3. Lumbar degenerative disk disease, L4-5  4. History of nephrolithiasis. Cysts on left kidney  5. Right flexor pollicis longus tendonitis  6. Right CTS  7. DeQuervain's Tenosynovitis  8. Fall with concussion and PCS including increased headaches and BPPV 9. Lymphoma?---bx pending 6/5  PLAN:  1.Hold on vestibular rehab for BPPV pending lymph node biopsy/plan. Asked her to be extremely careful in the meantime. She is experiencing some improvement 2. After informed consent and preparation of the skin with isopropyl alcohol, I injected  the bilateral lumbar paraspinals, left thoracic paraspinals and left trap (x3) with 2cc of 1% lidocaine. The patient tolerated well, and no complications were experienced. Post-injection instructions were provided. 3. Will arrange botox for migraine headaches---next month pending shipment of botox to our office  4. I refilled her Norco 5/325 at #60 which she had taken a few more of because of pain. Also refilled duragesic patch  5. She will follow up with nursing in one month. 30 minutes of face to face patient care time were spent during this visit. All questions were encouraged and answered.

## 2014-02-20 NOTE — Patient Instructions (Signed)
PLEASE CALL ME WITH ANY PROBLEMS OR QUESTIONS (#[phone]).      

## 2014-02-25 ENCOUNTER — Telehealth (INDEPENDENT_AMBULATORY_CARE_PROVIDER_SITE_OTHER): Payer: Self-pay | Admitting: General Surgery

## 2014-02-25 NOTE — Telephone Encounter (Signed)
pt has received letters from [facility] and [facility] / the letters are very troubling to patient / letters state that [facility] sent letter stating not in network they will make best effort to meet in network / her insurance company wrote letter to explain there out of network / she is very concerned and does not want it at [facility]  Please advise

## 2014-02-26 NOTE — Telephone Encounter (Signed)
Ok to move it to [facility] or [facility] day

## 2014-02-27 ENCOUNTER — Telehealth: Payer: Self-pay | Admitting: Medical Oncology

## 2014-02-27 NOTE — Telephone Encounter (Signed)
Patient called to inform office/MD that she is scheduled for biopsy of right groin lymph node 06/05 @ noon @ the day surgical center at [facility].   MD inboxed.  F/u appt 06/19

## 2014-03-02 NOTE — Progress Notes (Signed)
Urine drug screen from 02/20/2014 was consistent. 

## 2014-03-03 ENCOUNTER — Encounter (HOSPITAL_BASED_OUTPATIENT_CLINIC_OR_DEPARTMENT_OTHER): Payer: Self-pay | Admitting: *Deleted

## 2014-03-03 NOTE — Progress Notes (Signed)
To come in for bmet-takes lasix

## 2014-03-05 ENCOUNTER — Encounter (HOSPITAL_BASED_OUTPATIENT_CLINIC_OR_DEPARTMENT_OTHER)
Admission: RE | Admit: 2014-03-05 | Discharge: 2014-03-05 | Disposition: A | Discharge status: Home / Self Care | Payer: 59 | Source: Ambulatory Visit | Attending: General Surgery | Admitting: General Surgery

## 2014-03-05 LAB — BASIC METABOLIC PANEL
BUN: 9 mg/dL (ref 6–23)
CHLORIDE: 93 meq/L — AB (ref 96–112)
CO2: 33 mEq/L — ABNORMAL HIGH (ref 19–32)
Calcium: 9.7 mg/dL (ref 8.4–10.5)
Creatinine, Ser: 0.63 mg/dL (ref 0.50–1.10)
GFR calc Af Amer: >90 mL/min (ref >90)
GFR calc non Af Amer: >90 mL/min (ref >90)
GLUCOSE: 119 mg/dL — AB (ref 70–99)
POTASSIUM: 3.5 meq/L — AB (ref 3.7–5.3)
Sodium: 138 mEq/L (ref 137–147)

## 2014-03-06 ENCOUNTER — Encounter (HOSPITAL_BASED_OUTPATIENT_CLINIC_OR_DEPARTMENT_OTHER): Payer: 59 | Admitting: Certified Registered"

## 2014-03-06 ENCOUNTER — Ambulatory Visit (HOSPITAL_BASED_OUTPATIENT_CLINIC_OR_DEPARTMENT_OTHER)
Admission: RE | Admit: 2014-03-06 | Discharge: 2014-03-06 | Disposition: A | Discharge status: Home / Self Care | Payer: 59 | Source: Ambulatory Visit | Attending: General Surgery | Admitting: General Surgery

## 2014-03-06 ENCOUNTER — Encounter (HOSPITAL_BASED_OUTPATIENT_CLINIC_OR_DEPARTMENT_OTHER): Payer: Self-pay | Admitting: Certified Registered"

## 2014-03-06 ENCOUNTER — Encounter (HOSPITAL_BASED_OUTPATIENT_CLINIC_OR_DEPARTMENT_OTHER)
Admission: RE | Disposition: A | Discharge status: Home / Self Care | Payer: Self-pay | Source: Ambulatory Visit | Attending: General Surgery

## 2014-03-06 ENCOUNTER — Ambulatory Visit (HOSPITAL_BASED_OUTPATIENT_CLINIC_OR_DEPARTMENT_OTHER): Payer: 59 | Admitting: Certified Registered"

## 2014-03-06 ENCOUNTER — Ambulatory Visit: Payer: 59 | Admitting: Physical Medicine & Rehabilitation

## 2014-03-06 DIAGNOSIS — Z91041 Radiographic dye allergy status: Secondary | ICD-10-CM | POA: Insufficient documentation

## 2014-03-06 DIAGNOSIS — Z881 Allergy status to other antibiotic agents status: Secondary | ICD-10-CM | POA: Insufficient documentation

## 2014-03-06 DIAGNOSIS — IMO0001 Reserved for inherently not codable concepts without codable children: Secondary | ICD-10-CM | POA: Insufficient documentation

## 2014-03-06 DIAGNOSIS — Z886 Allergy status to analgesic agent status: Secondary | ICD-10-CM | POA: Insufficient documentation

## 2014-03-06 DIAGNOSIS — F4310 Post-traumatic stress disorder, unspecified: Secondary | ICD-10-CM | POA: Insufficient documentation

## 2014-03-06 DIAGNOSIS — Z87442 Personal history of urinary calculi: Secondary | ICD-10-CM | POA: Insufficient documentation

## 2014-03-06 DIAGNOSIS — F411 Generalized anxiety disorder: Secondary | ICD-10-CM | POA: Insufficient documentation

## 2014-03-06 DIAGNOSIS — Z888 Allergy status to other drugs, medicaments and biological substances status: Secondary | ICD-10-CM | POA: Insufficient documentation

## 2014-03-06 DIAGNOSIS — J45909 Unspecified asthma, uncomplicated: Secondary | ICD-10-CM | POA: Insufficient documentation

## 2014-03-06 DIAGNOSIS — I10 Essential (primary) hypertension: Secondary | ICD-10-CM | POA: Insufficient documentation

## 2014-03-06 DIAGNOSIS — K5730 Diverticulosis of large intestine without perforation or abscess without bleeding: Secondary | ICD-10-CM | POA: Insufficient documentation

## 2014-03-06 DIAGNOSIS — M25819 Other specified joint disorders, unspecified shoulder: Secondary | ICD-10-CM | POA: Insufficient documentation

## 2014-03-06 DIAGNOSIS — Z880 Allergy status to penicillin: Secondary | ICD-10-CM | POA: Insufficient documentation

## 2014-03-06 DIAGNOSIS — M7580 Other shoulder lesions, unspecified shoulder: Secondary | ICD-10-CM

## 2014-03-06 DIAGNOSIS — R599 Enlarged lymph nodes, unspecified: Secondary | ICD-10-CM | POA: Insufficient documentation

## 2014-03-06 DIAGNOSIS — M109 Gout, unspecified: Secondary | ICD-10-CM | POA: Insufficient documentation

## 2014-03-06 DIAGNOSIS — Z79899 Other long term (current) drug therapy: Secondary | ICD-10-CM | POA: Insufficient documentation

## 2014-03-06 DIAGNOSIS — F319 Bipolar disorder, unspecified: Secondary | ICD-10-CM | POA: Insufficient documentation

## 2014-03-06 DIAGNOSIS — Z883 Allergy status to other anti-infective agents status: Secondary | ICD-10-CM | POA: Insufficient documentation

## 2014-03-06 HISTORY — DX: Presence of dental prosthetic device (complete) (partial): Z97.2

## 2014-03-06 HISTORY — DX: Complete loss of teeth, unspecified cause, unspecified class: K08.109

## 2014-03-06 HISTORY — PX: LYMPH NODE BIOPSY: SHX201

## 2014-03-06 HISTORY — DX: Presence of spectacles and contact lenses: Z97.3

## 2014-03-06 SURGERY — LYMPH NODE BIOPSY
Anesthesia: General | Site: Groin | Laterality: Right

## 2014-03-06 MED ORDER — FENTANYL CITRATE 0.05 MG/ML IJ SOLN
INTRAMUSCULAR | Status: AC
  Filled 2014-03-06: qty 6

## 2014-03-06 MED ORDER — DEXAMETHASONE SODIUM PHOSPHATE 4 MG/ML IJ SOLN
INTRAMUSCULAR | Status: DC | PRN
  Administered 2014-03-06: 10 mg via INTRAVENOUS

## 2014-03-06 MED ORDER — BUPIVACAINE-EPINEPHRINE 0.25% -1:200000 IJ SOLN
INTRAMUSCULAR | Status: DC | PRN
  Administered 2014-03-06: 10 mL

## 2014-03-06 MED ORDER — PROPOFOL 10 MG/ML IV BOLUS
INTRAVENOUS | Status: DC | PRN
  Administered 2014-03-06: 200 mg via INTRAVENOUS

## 2014-03-06 MED ORDER — LIDOCAINE HCL (PF) 1 % IJ SOLN
INTRAMUSCULAR | Status: AC
  Filled 2014-03-06: qty 30

## 2014-03-06 MED ORDER — LACTATED RINGERS IV SOLN
INTRAVENOUS | Status: DC
  Administered 2014-03-06: 11:00:00 via INTRAVENOUS

## 2014-03-06 MED ORDER — FENTANYL CITRATE 0.05 MG/ML IJ SOLN
INTRAMUSCULAR | Status: DC | PRN
  Administered 2014-03-06: 50 ug via INTRAVENOUS

## 2014-03-06 MED ORDER — FENTANYL CITRATE 0.05 MG/ML IJ SOLN: 50.0000 ug | INTRAMUSCULAR | Status: DC | PRN

## 2014-03-06 MED ORDER — BUPIVACAINE HCL (PF) 0.25 % IJ SOLN
INTRAMUSCULAR | Status: AC
  Filled 2014-03-06: qty 30

## 2014-03-06 MED ORDER — ONDANSETRON HCL 4 MG/2ML IJ SOLN
INTRAMUSCULAR | Status: DC | PRN
  Administered 2014-03-06: 4 mg via INTRAVENOUS

## 2014-03-06 MED ORDER — HYDROMORPHONE HCL PF 1 MG/ML IJ SOLN: 0.2500 mg | INTRAMUSCULAR | Status: DC | PRN

## 2014-03-06 MED ORDER — OXYCODONE HCL 5 MG/5ML PO SOLN: 5.0000 mg | Freq: Once | ORAL | Status: DC | PRN

## 2014-03-06 MED ORDER — MIDAZOLAM HCL 2 MG/2ML IJ SOLN
INTRAMUSCULAR | Status: AC
  Filled 2014-03-06: qty 2

## 2014-03-06 MED ORDER — MIDAZOLAM HCL 5 MG/5ML IJ SOLN
INTRAMUSCULAR | Status: DC | PRN
  Administered 2014-03-06: 2 mg via INTRAVENOUS

## 2014-03-06 MED ORDER — BUPIVACAINE-EPINEPHRINE (PF) 0.25% -1:200000 IJ SOLN
INTRAMUSCULAR | Status: AC
  Filled 2014-03-06: qty 30

## 2014-03-06 MED ORDER — OXYCODONE-ACETAMINOPHEN 5-325 MG PO TABS: 1.0000 | ORAL_TABLET | ORAL | Status: DC | PRN

## 2014-03-06 MED ORDER — MIDAZOLAM HCL 2 MG/2ML IJ SOLN: 1.0000 mg | INTRAMUSCULAR | Status: DC | PRN

## 2014-03-06 MED ORDER — OXYCODONE HCL 5 MG PO TABS: 5.0000 mg | ORAL_TABLET | Freq: Once | ORAL | Status: DC | PRN

## 2014-03-06 MED ORDER — LIDOCAINE HCL (CARDIAC) 20 MG/ML IV SOLN
INTRAVENOUS | Status: DC | PRN
  Administered 2014-03-06: 100 mg via INTRAVENOUS

## 2014-03-06 SURGICAL SUPPLY — 46 items
BLADE 10 SAFETY STRL DISP (BLADE) ×2 IMPLANT
BLADE 15 SAFETY STRL DISP (BLADE) ×2 IMPLANT
BLADE SURG ROTATE 9660 (MISCELLANEOUS) ×2 IMPLANT
CANISTER SUCT 1200ML W/VALVE (MISCELLANEOUS) IMPLANT
CHLORAPREP W/TINT 26ML (MISCELLANEOUS) ×2 IMPLANT
COVER MAYO STAND STRL (DRAPES) ×2 IMPLANT
COVER TABLE BACK 60X90 (DRAPES) ×2 IMPLANT
DECANTER SPIKE VIAL GLASS SM (MISCELLANEOUS) IMPLANT
DERMABOND ADVANCED (GAUZE/BANDAGES/DRESSINGS) ×1
DERMABOND ADVANCED .7 DNX12 (GAUZE/BANDAGES/DRESSINGS) ×1 IMPLANT
DRAPE PED LAPAROTOMY (DRAPES) ×2 IMPLANT
DRAPE UTILITY XL STRL (DRAPES) ×2 IMPLANT
ELECT COATED BLADE 2.86 ST (ELECTRODE) ×2 IMPLANT
ELECT REM PT RETURN 9FT ADLT (ELECTROSURGICAL) ×2
ELECTRODE REM PT RTRN 9FT ADLT (ELECTROSURGICAL) ×1 IMPLANT
GAUZE SPONGE 4X4 16PLY XRAY LF (GAUZE/BANDAGES/DRESSINGS) IMPLANT
GLOVE BIO SURGEON STRL SZ7.5 (GLOVE) ×2 IMPLANT
GLOVE EXAM NITRILE MD LF STRL (GLOVE) ×2 IMPLANT
GLOVE SURG SS PI 7.0 STRL IVOR (GLOVE) ×2 IMPLANT
GOWN STRL REUS W/ TWL LRG LVL3 (GOWN DISPOSABLE) ×2 IMPLANT
GOWN STRL REUS W/TWL LRG LVL3 (GOWN DISPOSABLE) ×2
NEEDLE HYPO 25X1 1.5 SAFETY (NEEDLE) ×2 IMPLANT
NS IRRIG 1000ML POUR BTL (IV SOLUTION) IMPLANT
PACK BASIN DAY SURGERY FS (CUSTOM PROCEDURE TRAY) ×2 IMPLANT
PENCIL BUTTON HOLSTER BLD 10FT (ELECTRODE) ×2 IMPLANT
SLEEVE SCD COMPRESS KNEE MED (MISCELLANEOUS) ×2 IMPLANT
SPONGE LAP 18X18 X RAY DECT (DISPOSABLE) ×2 IMPLANT
SUT CHROMIC 3 0 SH 27 (SUTURE) IMPLANT
SUT ETHILON 3 0 PS 1 (SUTURE) IMPLANT
SUT MON AB 4-0 PC3 18 (SUTURE) ×2 IMPLANT
SUT PROLENE 3 0 PS 2 (SUTURE) IMPLANT
SUT SILK 2 0 FS (SUTURE) IMPLANT
SUT VIC AB 3-0 54X BRD REEL (SUTURE) ×1 IMPLANT
SUT VIC AB 3-0 BRD 54 (SUTURE) ×1
SUT VIC AB 3-0 FS2 27 (SUTURE) IMPLANT
SUT VIC AB 3-0 SH 27 (SUTURE)
SUT VIC AB 3-0 SH 27X BRD (SUTURE) IMPLANT
SUT VIC AB 4-0 RB1 27 (SUTURE)
SUT VIC AB 4-0 RB1 27X BRD (SUTURE) IMPLANT
SUT VICRYL 4-0 PS2 18IN ABS (SUTURE) IMPLANT
SUT VICRYL AB 3 0 TIES (SUTURE) IMPLANT
SYR CONTROL 10ML LL (SYRINGE) ×2 IMPLANT
TOWEL OR 17X24 6PK STRL BLUE (TOWEL DISPOSABLE) ×4 IMPLANT
TOWEL OR NON WOVEN STRL DISP B (DISPOSABLE) ×2 IMPLANT
TUBE CONNECTING 20X1/4 (TUBING) IMPLANT
YANKAUER SUCT BULB TIP NO VENT (SUCTIONS) IMPLANT

## 2014-03-06 NOTE — Transfer of Care (Signed)
Immediate Anesthesia Transfer of Care Note  Patient: Patty Salas  Procedure(s) Performed: Procedure(s): right groin LYMPH NODE BIOPSY (Right)  Patient Location: PACU  Anesthesia Type:General  Level of Consciousness: awake, alert  and oriented  Airway & Oxygen Therapy: Patient Spontanous Breathing and Patient connected to face mask oxygen  Post-op Assessment: Report given to PACU RN and Post -op Vital signs reviewed and stable  Post vital signs: Reviewed and stable  Complications: No apparent anesthesia complications

## 2014-03-06 NOTE — Anesthesia Preprocedure Evaluation (Signed)
Anesthesia Evaluation    Reviewed: Allergy & Precautions, H&P , NPO status , Patient's Chart, lab work & pertinent test results  History of Anesthesia Complications Negative for: history of anesthetic complications  Airway       Dental   Pulmonary asthma ,          Cardiovascular hypertension, negative cardio ROS      Neuro/Psych  Headaches, PSYCHIATRIC DISORDERS Anxiety Depression Bipolar Disorder    GI/Hepatic negative GI ROS, Neg liver ROS,   Endo/Other  negative endocrine ROS  Renal/GU negative Renal ROS  negative genitourinary   Musculoskeletal  (+) Fibromyalgia -  Abdominal   Peds negative pediatric ROS (+)  Hematology negative hematology ROS (+)   Anesthesia Other Findings   Reproductive/Obstetrics negative OB ROS                           Anesthesia Physical Anesthesia Plan  ASA: II  Anesthesia Plan: General   Post-op Pain Management:    Induction: Intravenous  Airway Management Planned: LMA  Additional Equipment:   Intra-op Plan:   Post-operative Plan: Extubation in OR  Informed Consent:   Plan Discussed with: CRNA, Anesthesiologist and Surgeon  Anesthesia Plan Comments:         Anesthesia Quick Evaluation

## 2014-03-06 NOTE — Interval H&P Note (Signed)
History and Physical Interval Note:  03/06/2014 11:03 AM  Patty Salas  has presented today for surgery, with the diagnosis of enlarged lymph node right groin   The various methods of treatment have been discussed with the patient and family. After consideration of risks, benefits and other options for treatment, the patient has consented to  Procedure(s): right groin LYMPH NODE BIOPSY (Right) as a surgical intervention .  The patient's history has been reviewed, patient examined, no change in status, stable for surgery.  I have reviewed the patient's chart and labs.  Questions were answered to the patient's satisfaction.     Luella Cook III

## 2014-03-06 NOTE — Op Note (Signed)
03/06/2014  12:33 PM  PATIENT:  Patty Salas  58 y.o. female  PRE-OPERATIVE DIAGNOSIS:  enlarged lymph node right groin   POST-OPERATIVE DIAGNOSIS:  enlarged lymph node right groin  PROCEDURE:  Procedure(s): right groin LYMPH NODE BIOPSY (Right)  SURGEON:  Surgeon(s) and Role:    * Merrie Roof, MD - Primary  PHYSICIAN ASSISTANT:   ASSISTANTS: none   ANESTHESIA:   general  EBL:     BLOOD ADMINISTERED:none  DRAINS: none   LOCAL MEDICATIONS USED:  MARCAINE     SPECIMEN:  Source of Specimen:  right groin lymph node  DISPOSITION OF SPECIMEN:  PATHOLOGY  COUNTS:  YES  TOURNIQUET:  * No tourniquets in log *  DICTATION: .Dragon Dictation After informed consent was obtained the patient was brought to the operating room and placed in the supine position on the operating room table. After adequate induction of general anesthesia the patient's right groin was prepped with ChloraPrep, allowed to dry, and draped in usual sterile manner. A palpable lymph node was identified in the right groin. The right groin was then infiltrated with quarter percent Marcaine. A vertically oriented incision was made with a 15 blade knife overlying the palpable lymph node. This incision was carried through the skin and subcutaneous tissue sharply with the electrocautery. Once through the subcutaneous fat the rest of the dissection was done bluntly with a hemostat. The lymph node was identified. It was grasped with an Allis clamp and elevated. The lymphatic channels around the lymph node were dissected bluntly with a hemostat and then clamped with the hemostat, divided, and ligated with 3-0 Vicryl ties. The lymph node was then sent to pathology for further lymphoma workup. The wound was irrigated with copious amounts of saline. The wound was examined and found to be hemostatic. The deep layer the wound was then closed with interrupted 3-0 Vicryl stitches. The skin was then closed with interrupted 4-0  Monocryl subcuticular stitches. Dermabond dressings were applied. The patient tolerated the procedure well. At the end of the case all needle sponge and instrument counts were correct. The patient was then awakened and taken to recovery in stable condition.  PLAN OF CARE: Discharge to home after PACU  PATIENT DISPOSITION:  PACU - hemodynamically stable.   Delay start of Pharmacological VTE agent (>24hrs) due to surgical blood loss or risk of bleeding: not applicable

## 2014-03-06 NOTE — Anesthesia Procedure Notes (Signed)
Procedure Name: LMA Insertion Performed by: Terrance Mass Pre-anesthesia Checklist: Patient identified, Timeout performed, Emergency Drugs available, Suction available and Patient being monitored Patient Re-evaluated:Patient Re-evaluated prior to inductionOxygen Delivery Method: Circle system utilized Preoxygenation: Pre-oxygenation with 100% oxygen Intubation Type: IV induction Ventilation: Mask ventilation without difficulty LMA: LMA inserted LMA Size: 3.0 Number of attempts: 1 Placement Confirmation: positive ETCO2 Tube secured with: Tape Dental Injury: Teeth and Oropharynx as per pre-operative assessment

## 2014-03-06 NOTE — H&P (View-Only) (Signed)
Patient ID: Patty Salas, female   DOB: [birth year], 58 y.o.   MRN: [id number]  Chief Complaint  Patient presents with  . New Evaluation    eval pt needs bx for lymphoma staging    HPI Patty Salas is a 58 y.o. female.  We are asked to see the patient in consultation by Dr.Gorsuch to evaluate her for enlarged lymph nodes. The patient is a 58 year old white female who has been experiencing pain in her low back. She has also been having blood in her urine. She is being evaluated by the urologist for this. She has had increased fatigue although she does have a history of fibromyalgia and chronic fatigue. She has been maintaining her weight. She recently had a CT PET that showed a mildly enlarged lymph nodes behind the aorta and in the right groin with some slightly increased metabolic activity. The oncologist have requested a lymph node biopsy to rule out lymphoma  HPI  Past Medical History  Diagnosis Date  . Myalgia and myositis, unspecified   . Fibromyalgia   . Anxiety   . Depression   . Cervical facet syndrome   . Calcifying tendinitis of shoulder   . Carpal tunnel syndrome   . Thoracic radiculopathy   . Headache(784.0)   . Hypertension   . Bipolar affective   . Gout   . Asthma   . Renal calculi   . Diverticulosis   . PTSD (post-traumatic stress disorder)     Past Surgical History  Procedure Laterality Date  . Abdominal hysterectomy    . Appendectomy    . Cholecystectomy    . Tonsillectomy    . Neck surgery    . Foot surgery    . Back surgery    . Cystoscopy    . Abdominal adhesion surgery    . Colonoscopy      Family History  Problem Relation Age of Onset  . Cancer Mother   . Migraines Mother   . Heart disease Father   . Hypertension Father   . Hypertension Brother   . Migraines Brother   . Hypertension Brother   . Migraines Brother   . Migraines Daughter     Social History History  Substance Use Topics  . Smoking status: Never Smoker   . Smokeless  tobacco: Never Used  . Alcohol Use: No    Allergies  Allergen Reactions  . Divalproex Sodium Anaphylaxis  . Hydrochlorothiazide Anaphylaxis    Pt loses facial movement uncontrolled;   . Imitrex [Sumatriptan Succinate] Anaphylaxis  . Mellaril Anaphylaxis  . Olanzapine Anaphylaxis  . Aripiprazole Other (See Comments)    Stiffened muscles  . Aspirin Hives  . Dalmane [Flurazepam Hcl] Other (See Comments)    Stiffened all muscles  . Darifenacin Hydrobromide Er Hives    Hives on back and looked like sunburn  . Metoclopramide Hcl Other (See Comments)    headache  . Seroquel [Quetiapine Fumerate] Other (See Comments)    extreme fatigue and bad dreams  . Statins Hives  . Stelazine Other (See Comments)    Stiffened muscles  . Thorazine [Chlorpromazine Hcl] Other (See Comments)    Stiffened muscles  . Abilify [Aripiprazole]   . Allopurinol   . Barium-Containing Compounds   . Colchicine   . Diflucan [Fluconazole]   . Duract [Bromfenac]   . Iohexol      Code: HIVES, Desc: pt broke out in red rash and hives after CT injection on 12/07/09.-pt needs 13 hr prep kit,  Onset Date: [id number]   . Ivp Dye [Iodinated Diagnostic Agents]   . Latex   . Lyrica [Pregabalin]   . Myrbetriq [Mirabegron]     swelling  . Nsaids   . Penicillins   . Reglan [Metoclopramide]   . Renografin [Diatrizoate]   . Risperdal [Risperidone]   . Urocit - K [Potassium Citrate]   . Zyprexa [Olanzapine]   . Avelox [Moxifloxacin Hcl In Nacl] Nausea And Vomiting  . Diclofenac Rash  . Doxycycline Nausea Only  . E-Mycin [Erythromycin Base] Nausea Only  . Flagyl [Metronidazole Hcl] Nausea And Vomiting  . Lamictal [Lamotrigine] Rash  . Pregabalin   . Risperidone Other (See Comments)    Too sedating  . Topamax Other (See Comments)    Confusion, tremor, blurred vision    Current Outpatient Prescriptions  Medication Sig Dispense Refill  . acyclovir (ZOVIRAX) 800 MG tablet Take 800 mg by mouth 3 (three) times daily  as needed.      . AXERT 12.5 MG tablet       . bisacodyl (DULCOLAX) 5 MG EC tablet Take 10 mg by mouth daily as needed.      . cyclobenzaprine (FLEXERIL) 10 MG tablet Take 10 mg by mouth 3 (three) times daily.      Marland Kitchen docusate sodium (COLACE) 100 MG capsule Take 100 mg by mouth daily as needed.      Marland Kitchen EPINEPHrine (EPIPEN JR) 0.15 MG/0.3ML injection Inject 0.3 mg into the muscle as needed.      Marland Kitchen estradiol (VIVELLE-DOT) 0.1 MG/24HR Place 1 patch onto the skin 2 (two) times a week.      . fentaNYL (DURAGESIC - DOSED MCG/HR) 25 MCG/HR patch Place 1 patch (25 mcg total) onto the skin every 3 (three) days.  10 patch  0  . furosemide (LASIX) 40 MG tablet Take 40-80 mg by mouth daily as needed.      . gabapentin (NEURONTIN) 300 MG capsule Take 600 mg by mouth at bedtime.      Marland Kitchen HYDROcodone-acetaminophen (NORCO/VICODIN) 5-325 MG per tablet Take 1 tablet by mouth every 6 (six) hours as needed.  90 tablet  0  . lidocaine (XYLOCAINE) 2 % solution as needed.       Marland Kitchen LORazepam (ATIVAN) 1 MG tablet Take 1 mg by mouth 3 (three) times daily.      . potassium chloride SA (K-DUR,KLOR-CON) 20 MEQ tablet Take 20 mEq by mouth once.      Marland Kitchen PROAIR HFA 108 (90 BASE) MCG/ACT inhaler Inhale 2 puffs into the lungs as directed.      . promethazine (PHENERGAN) 25 MG tablet every 6 (six) hours as needed.       . Trospium Chloride 60 MG CP24 Take 1 capsule by mouth Daily.      . Vitamin D, Ergocalciferol, (DRISDOL) 50000 UNITS CAPS capsule       . zolmitriptan (ZOMIG-ZMT) 5 MG disintegrating tablet Take 5 mg by mouth as needed for migraine (May use up to two tablets per week).       No current facility-administered medications for this visit.    Review of Systems Review of Systems  Constitutional: Positive for activity change and fatigue.  HENT: Negative.   Eyes: Positive for visual disturbance.  Respiratory: Negative.   Cardiovascular: Negative.   Gastrointestinal: Positive for nausea and abdominal pain.  Endocrine:  Negative.   Genitourinary: Positive for hematuria.  Musculoskeletal: Positive for back pain and neck stiffness.  Allergic/Immunologic: Positive for environmental allergies.  Neurological:  Positive for weakness and light-headedness.  Hematological: Negative.   Psychiatric/Behavioral: Negative.     Blood pressure 138/82, pulse 72, temperature 97.4 F (36.3 C), temperature source Temporal, resp. rate 14, height 5' 1"  (1.549 m), weight 135 lb (61.236 kg).  Physical Exam Physical Exam  Constitutional: She is oriented to person, place, and time. She appears well-developed and well-nourished.  HENT:  Head: Normocephalic and atraumatic.  Eyes: Conjunctivae and EOM are normal. Pupils are equal, round, and reactive to light.  Neck: Normal range of motion. Neck supple.  Cardiovascular: Normal rate, regular rhythm and normal heart sounds.   Pulmonary/Chest: Effort normal and breath sounds normal.  Abdominal: Soft. Bowel sounds are normal.  Musculoskeletal: Normal range of motion.  There appeared to be some mildly enlarged lymph nodes in the right groin that are tender to palpation. There is no palpable axillary or supraclavicular or cervical lymphadenopathy.  Lymphadenopathy:    She has no cervical adenopathy.  Neurological: She is alert and oriented to person, place, and time.  Skin: Skin is warm and dry.  Psychiatric: She has a normal mood and affect. Her behavior is normal.    Data Reviewed As above  Assessment    The patient appears to have some mildly abnormal lymph nodes in the right groin. Request has been made for [place] to biopsy one of these lymph nodes for a lymphoma workup. I have discussed with her in detail the risks and benefits of the operation to do this as well as some of the technical aspects and she understands and wishes to proceed     Plan    Plan for right groin lymph node biopsy        Luella Cook III 02/12/2014, 2:35 PM

## 2014-03-06 NOTE — Anesthesia Postprocedure Evaluation (Signed)
Anesthesia Post Note  Patient: Patty Salas  Procedure(s) Performed: Procedure(s) (LRB): right groin LYMPH NODE BIOPSY (Right)  Anesthesia type: general  Patient location: PACU  Post pain: Pain level controlled  Post assessment: Patient's Cardiovascular Status Stable  Last Vitals:  Filed Vitals:   03/06/14 1330  BP: 106/55  Pulse: 78  Temp:   Resp: 12    Post vital signs: Reviewed and stable  Level of consciousness: sedated  Complications: No apparent anesthesia complications

## 2014-03-06 NOTE — Discharge Instructions (Signed)

## 2014-03-09 ENCOUNTER — Encounter (HOSPITAL_BASED_OUTPATIENT_CLINIC_OR_DEPARTMENT_OTHER): Payer: Self-pay | Admitting: General Surgery

## 2014-03-12 ENCOUNTER — Telehealth (INDEPENDENT_AMBULATORY_CARE_PROVIDER_SITE_OTHER): Payer: Self-pay

## 2014-03-12 ENCOUNTER — Ambulatory Visit: Payer: 59 | Admitting: Hematology and Oncology

## 2014-03-12 NOTE — Telephone Encounter (Signed)
Pt calling to see if Dr Marlou Starks had reviewed her path results. Informed pt that I would send a message to Dr Marlou Starks to let him know that she would like him to call her with results.

## 2014-03-16 ENCOUNTER — Telehealth: Payer: Self-pay | Admitting: *Deleted

## 2014-03-16 ENCOUNTER — Telehealth (INDEPENDENT_AMBULATORY_CARE_PROVIDER_SITE_OTHER): Payer: Self-pay | Admitting: General Surgery

## 2014-03-16 NOTE — Telephone Encounter (Signed)
Reviewed the patient's pathology with her and informed her that the lymph nodes came back benign.

## 2014-03-16 NOTE — Telephone Encounter (Signed)
Message copied by Flossie Buffy on Mon Mar 16, 2014  1:55 PM ------      Message from: Luella Cook III      Created: Mon Mar 16, 2014  1:37 PM       Benign lymph node ------

## 2014-03-16 NOTE — Telephone Encounter (Signed)
Pt reports Patty Salas was told by Dr. Marlou Starks that her Biopsy is Benign.  Patty Salas asks if it is necessary to keep f/u appt w/ Dr. Alvy Bimler as scheduled on 6/19?

## 2014-03-16 NOTE — Telephone Encounter (Signed)
Explained to pt to keep appt to see Dr. Alvy Bimler to discuss futher plan of care per her message below.  Pt verbalized understanding.

## 2014-03-16 NOTE — Telephone Encounter (Signed)
It is non-diagnostic I recommend follow-up to discuss either second opinion, repeat biopsy in another location or repeat scan again in a few months

## 2014-03-17 ENCOUNTER — Ambulatory Visit: Payer: 59 | Admitting: Neurology

## 2014-03-20 ENCOUNTER — Ambulatory Visit: Payer: 59 | Admitting: Hematology and Oncology

## 2014-03-24 ENCOUNTER — Ambulatory Visit (INDEPENDENT_AMBULATORY_CARE_PROVIDER_SITE_OTHER): Payer: 59 | Admitting: General Surgery

## 2014-03-24 ENCOUNTER — Telehealth: Payer: Self-pay | Admitting: Hematology and Oncology

## 2014-03-24 ENCOUNTER — Encounter (INDEPENDENT_AMBULATORY_CARE_PROVIDER_SITE_OTHER): Payer: Self-pay | Admitting: General Surgery

## 2014-03-24 VITALS — BP 134/80 | HR 100 | Temp 99.8°F | Ht 66.0 in | Wt 139.0 lb

## 2014-03-24 DIAGNOSIS — R599 Enlarged lymph nodes, unspecified: Secondary | ICD-10-CM

## 2014-03-24 NOTE — Telephone Encounter (Signed)
pt called to confirm that she will make thursdays appt.Marland KitchenMarland Kitchen

## 2014-03-24 NOTE — Telephone Encounter (Signed)
lvm for pt regarding to 6.25 appt.Marland KitchenMarland KitchenMarland Kitchen

## 2014-03-24 NOTE — Patient Instructions (Signed)
May return to normal activities 

## 2014-03-24 NOTE — Progress Notes (Signed)
Subjective:     Patient ID: Patty Salas, female   DOB: 09-18-1956, 58 y.o.   MRN: [id number]  HPI The patient is a 58 year old white female who is about 2 weeks status post biopsy of a right groin lymph node. The pathology returned benign. She tolerated the surgery well. She has no complaints today.  Review of Systems     Objective:   Physical Exam On exam her right groin incision is healing nicely with no sign of infection or significant seroma    Assessment:     The patient is 2 weeks status post biopsy of a right groin lymph node that was benign     Plan:     At this point the lymph node that was biopsied in the right groin had similar activity on PET scan to the other lymph nodes that were identified that were para-aortic. For this reason I am not sure that they would be a lot of futility in putting her through a big abdominal exploration to try to biopsy the para-aortic lymph nodes. Certainly I think she needs to be followed over time and if the lymph nodes around her aorta were to enlarge or become more metabolically active and I think it might be reasonable to biopsy them. I've discussed this with her and her family and an agreement. She will continue to followup with the medical oncologist. I will plan to see her back in 3 months

## 2014-03-25 ENCOUNTER — Encounter: Payer: Self-pay | Admitting: Registered Nurse

## 2014-03-25 ENCOUNTER — Encounter: Payer: 59 | Attending: Physical Medicine and Rehabilitation | Admitting: Registered Nurse

## 2014-03-25 VITALS — BP 129/66 | HR 108 | Resp 16 | Ht 65.0 in | Wt 139.0 lb

## 2014-03-25 DIAGNOSIS — IMO0001 Reserved for inherently not codable concepts without codable children: Secondary | ICD-10-CM

## 2014-03-25 DIAGNOSIS — M797 Fibromyalgia: Secondary | ICD-10-CM

## 2014-03-25 DIAGNOSIS — M4716 Other spondylosis with myelopathy, lumbar region: Secondary | ICD-10-CM | POA: Insufficient documentation

## 2014-03-25 DIAGNOSIS — M65839 Other synovitis and tenosynovitis, unspecified forearm: Secondary | ICD-10-CM | POA: Insufficient documentation

## 2014-03-25 DIAGNOSIS — M5136 Other intervertebral disc degeneration, lumbar region: Secondary | ICD-10-CM

## 2014-03-25 DIAGNOSIS — G43519 Persistent migraine aura without cerebral infarction, intractable, without status migrainosus: Secondary | ICD-10-CM | POA: Insufficient documentation

## 2014-03-25 DIAGNOSIS — M65849 Other synovitis and tenosynovitis, unspecified hand: Secondary | ICD-10-CM

## 2014-03-25 DIAGNOSIS — M47816 Spondylosis without myelopathy or radiculopathy, lumbar region: Secondary | ICD-10-CM

## 2014-03-25 DIAGNOSIS — M5137 Other intervertebral disc degeneration, lumbosacral region: Secondary | ICD-10-CM

## 2014-03-25 DIAGNOSIS — M778 Other enthesopathies, not elsewhere classified: Secondary | ICD-10-CM

## 2014-03-25 DIAGNOSIS — M51379 Other intervertebral disc degeneration, lumbosacral region without mention of lumbar back pain or lower extremity pain: Secondary | ICD-10-CM | POA: Insufficient documentation

## 2014-03-25 DIAGNOSIS — M47817 Spondylosis without myelopathy or radiculopathy, lumbosacral region: Secondary | ICD-10-CM

## 2014-03-25 DIAGNOSIS — F0781 Postconcussional syndrome: Secondary | ICD-10-CM

## 2014-03-25 MED ORDER — HYDROCODONE-ACETAMINOPHEN 5-325 MG PO TABS: 1.0000 | ORAL_TABLET | Freq: Four times a day (QID) | ORAL | Status: DC | PRN

## 2014-03-25 MED ORDER — FENTANYL 25 MCG/HR TD PT72: 25.0000 ug | MEDICATED_PATCH | TRANSDERMAL | Status: DC

## 2014-03-25 NOTE — Progress Notes (Signed)
Subjective:    Patient ID: Patty Salas, female    DOB: 19-Oct-1955, 58 y.o.   MRN: [id number]  HPI: Patty Salas is a 58 year old female who returns for follow up for chronic pain and medication refill. She says her pain is located in her neck, shoulder blades, lower back. She rates her pain 9. Her current exercise program she is performing zip hallow exercises and walking.  She seen Dr. Marlou Starks on 03/24/14 she was told her lymph node biopsy was negative. On the same day she received a call from Dr. Alvy Bimler office staing they wanted to see her. She feels as though it was bad news. She is under the impression they were going to do surgery on her. She was very tearful and emotional support was given. I explained to wait till she see's Dr. Alvy Bimler to hear why they would like to see her before jumping to any conclusions. She verbalized understanding and emotional support was given.  Pain Inventory Average Pain 9 Pain Right Now 9 My pain is constant, sharp, burning and stabbing  In the last 24 hours, has pain interfered with the following? General activity 9 Relation with others 9 Enjoyment of life 9 What TIME of day is your pain at its worst? constant Sleep (in general) Fair  Pain is worse with: walking, bending, sitting, inactivity, standing and some activites Pain improves with: rest, heat/ice, therapy/exercise, medication and injections Relief from Meds: 5  Mobility walk without assistance walk with assistance use a cane use a walker how many minutes can you walk? 5-15 ability to climb steps?  yes do you drive?  yes needs help with transfers transfers alone Do you have any goals in this area?  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 I need assistance with the following:  meal prep, household duties and shopping Do you have any goals in this area?  yes  Neuro/Psych bladder control problems bowel control  problems weakness numbness tremor tingling trouble walking spasms dizziness confusion depression anxiety  Prior Studies [facility]  Physicians involved in your care Any changes since last visit?  no   Family History  Problem Relation Age of Onset  . Cancer Mother   . Migraines Mother   . Heart disease Father   . Hypertension Father   . Hypertension Brother   . Migraines Brother   . Hypertension Brother   . Migraines Brother   . Migraines Daughter    History   Social History  . Marital Status: Married    Spouse Name: N/A    Number of Children: 1  . Years of Education: COLLEGE1   Occupational History  . HOUSEWIFE    Social History Main Topics  . Smoking status: Never Smoker   . Smokeless tobacco: Never Used  . Alcohol Use: No  . Drug Use: No  . Sexual Activity: None   Other Topics Concern  . None   Social History Narrative  . None   Past Surgical History  Procedure Laterality Date  . Neck surgery  2002  . Foot surgery      lt  . Cystoscopy    . Abdominal adhesion surgery    . Colonoscopy    . Cholecystectomy  1985  . Tonsillectomy  1976  . Appendectomy  1993  . Abdominal hysterectomy  1995  . Colonoscopy    . Lithotripsy    . Lymph node biopsy Right 03/06/2014  Procedure: right groin LYMPH NODE BIOPSY;  Surgeon: Merrie Roof, MD;  Location: [place];  Service: General;  Laterality: Right;   Past Medical History  Diagnosis Date  . Myalgia and myositis, unspecified   . Fibromyalgia   . Anxiety   . Depression   . Cervical facet syndrome   . Calcifying tendinitis of shoulder   . Carpal tunnel syndrome   . Thoracic radiculopathy   . Headache(784.0)   . Hypertension   . Bipolar affective   . Gout   . Asthma   . Renal calculi   . Diverticulosis   . PTSD (post-traumatic stress disorder)   . Full dentures   . Wears glasses    BP 129/66  Pulse 108  Resp 16  Ht 5\' 5"  (1.651 m)  Wt 139 lb (63.05 kg)  BMI 23.13 kg/m2   SpO2 96%  Opioid Risk Score:   Fall Risk Score: High Fall Risk (>13 points) (patient educated handout declined)   Review of Systems  Constitutional: Positive for unexpected weight change.  Cardiovascular: Positive for leg swelling.  Gastrointestinal: Positive for abdominal pain and constipation.  Genitourinary: Positive for difficulty urinating.  Musculoskeletal: Positive for arthralgias, back pain, gait problem, myalgias and neck pain.  Neurological: Positive for dizziness, tremors, weakness and numbness.       Tremor, spasm  Psychiatric/Behavioral: Positive for confusion and dysphoric mood. The patient is nervous/anxious.   All other systems reviewed and are negative.      Objective:   Physical Exam  Nursing note and vitals reviewed. Constitutional: She is oriented to person, place, and time. She appears well-developed and well-nourished.  HENT:  Head: Normocephalic and atraumatic.  Neck: Normal range of motion. Neck supple.  Cervical Paraspinal Tenderness: C-3- C-5  Cardiovascular: Normal rate and regular rhythm.   Pulmonary/Chest: Effort normal and breath sounds normal.  Musculoskeletal:  Normal Muscle Bulk and Muscle testing reveals: Upper Extremities: Full ROM and Muscle strength on the Right 4/5 and Left 5/5. Right wrist stabilizer intact Spinal Forward Flexion 90 Degrees  Spine of Scapula Tenderness on the Left Hypersensitivity Thoracic and Lumbar Lower Extremities: Bilateral Flexion Produces Pain in Lumbar  Arises from chair with ease Narrow Based gait  Neurological: She is alert and oriented to person, place, and time.  Skin: Skin is warm and dry.  Psychiatric: She has a normal mood and affect.  Emotional and Tearful: Reassurance was given          Assessment & Plan:  1. History of fibromyalgia with myofascial pain and multiple trigger points. Continue with Heat and exercise Regime. Continue with gabapentin. 2. Chronic migraine headaches. Awaiting Botox  Treatment.  3. Lumbar degenerative disk disease, L4-5 : Refilled: Fentanyl Patch 25 mcg one patch every three days #10 and Hydrocodone 5/325 mg one tablet every 6 hours as needed #90. 4. History of nephrolithiasis. Cysts on left kidney: Urology Following  5. Right CTS: No Complaints today. Wrist stabilizer intact.   30 minutes of face to face patient care time was spent during this visit. All questions were encouraged and answered.   F/U in 1 month

## 2014-03-26 ENCOUNTER — Encounter: Payer: Self-pay | Admitting: Hematology and Oncology

## 2014-03-26 ENCOUNTER — Ambulatory Visit (HOSPITAL_BASED_OUTPATIENT_CLINIC_OR_DEPARTMENT_OTHER): Payer: 59 | Admitting: Hematology and Oncology

## 2014-03-26 VITALS — BP 117/63 | HR 110 | Temp 98.5°F | Resp 17 | Ht 65.0 in | Wt 140.8 lb

## 2014-03-26 DIAGNOSIS — F418 Other specified anxiety disorders: Secondary | ICD-10-CM

## 2014-03-26 DIAGNOSIS — R599 Enlarged lymph nodes, unspecified: Secondary | ICD-10-CM

## 2014-03-26 NOTE — Assessment & Plan Note (Signed)
I tried my best to reassure the patient today.

## 2014-03-26 NOTE — Assessment & Plan Note (Signed)
Recent lymph node biopsy came back benign. I recommend repeat CT scan in 3 months with her urologist and the patient will call me with test results. This could be followed closely with serial imaging studies. The cause of lymphadenopathy is unknown but sometimes could be associated with connective tissue disorder or reactive related to recurrence of diverticulitis.

## 2014-03-26 NOTE — Progress Notes (Signed)
[place] OFFICE PROGRESS NOTE  Patty Frees, MD DIAGNOSIS:  Benign lymphadenopathy  SUMMARY OF HEMATOLOGIC HISTORY: The patient is seen  here because of nonspecific lymphadenopathy detected on recent CT scan. This patient have chronic kidney stone disease. On 01/01/2014, she had CT scan without contrast for evaluation of kidney stones and was found to have multiple periaortic lymphadenopathy. The patient denies any recent infection. She does have history of diverticulosis and she thought she may have a bout of diverticulitis recently. PET CT scan dated 01/26/2014 show diffuse lymphadenopathy. On 03/06/2014, excisional lymph node biopsy is benign   INTERVAL HISTORY: Patty Salas 58 y.o. female returns for further followup. Her wound has healed.  I have reviewed the past medical history, past surgical history, social history and family history with the patient and they are unchanged from previous note.  ALLERGIES:  is allergic to divalproex sodium; hydrochlorothiazide; imitrex; latex; mellaril; olanzapine; penicillins; topamax; aripiprazole; aspirin; dalmane; darifenacin hydrobromide er; metoclopramide hcl; seroquel; statins; stelazine; thorazine; abilify; allopurinol; barium-containing compounds; colchicine; diflucan; duract; hyoscyamine; iohexol; ivp dye; lyrica; myrbetriq; nsaids; reglan; renografin; risperdal; urocit - k; zyprexa; avelox; diclofenac; doxycycline; e-mycin; flagyl; lamictal; pregabalin; and risperidone.  MEDICATIONS:  Current Outpatient Prescriptions  Medication Sig Dispense Refill  . AXERT 12.5 MG tablet       . bisacodyl (DULCOLAX) 5 MG EC tablet Take 10 mg by mouth daily as needed.      . cyclobenzaprine (FLEXERIL) 10 MG tablet Take 10 mg by mouth 3 (three) times daily.      Marland Kitchen docusate sodium (COLACE) 100 MG capsule Take 100 mg by mouth daily as needed.      Marland Kitchen estradiol (VIVELLE-DOT) 0.1 MG/24HR Place 1 patch onto the skin 2 (two) times a week.       . fentaNYL (DURAGESIC - DOSED MCG/HR) 25 MCG/HR patch Place 1 patch (25 mcg total) onto the skin every 3 (three) days.  10 patch  0  . furosemide (LASIX) 40 MG tablet Take 40-80 mg by mouth daily as needed.      . gabapentin (NEURONTIN) 300 MG capsule Take 600 mg by mouth at bedtime.      . hyoscyamine (LEVSIN, ANASPAZ) 0.125 MG tablet Take 0.125 mg by mouth every 4 (four) hours as needed.      Marland Kitchen LORazepam (ATIVAN) 1 MG tablet Take 1 mg by mouth 3 (three) times daily.      Marland Kitchen PROAIR HFA 108 (90 BASE) MCG/ACT inhaler Inhale 2 puffs into the lungs as directed.      . promethazine (PHENERGAN) 25 MG tablet every 6 (six) hours as needed.       . Trospium Chloride 60 MG CP24 Take 1 capsule by mouth Daily.      . Vitamin D, Ergocalciferol, (DRISDOL) 50000 UNITS CAPS capsule        No current facility-administered medications for this visit.     REVIEW OF SYSTEMS:   Constitutional: Denies fevers, chills or night sweats Eyes: Denies blurriness of vision Ears, nose, mouth, throat, and face: Denies mucositis or sore throat Respiratory: Denies cough, dyspnea or wheezes Cardiovascular: Denies palpitation, chest discomfort or lower extremity swelling Gastrointestinal:  Denies nausea, heartburn or change in bowel habits Skin: Denies abnormal skin rashes Lymphatics: Denies new lymphadenopathy or easy bruising Neurological:Denies numbness, tingling or new weaknesses Behavioral/Psych: Mood is stable, no new changes  All other systems were reviewed with the patient and are negative.  PHYSICAL EXAMINATION: ECOG PERFORMANCE STATUS: 0 - Asymptomatic  Filed  Vitals:   03/26/14 0831  BP: 117/63  Pulse: 110  Temp: 98.5 F (36.9 C)  Resp: 17   Filed Weights   03/26/14 0831  Weight: 140 lb 12.8 oz (63.866 kg)    GENERAL:alert, no distress and comfortable. She appears anxious SKIN: skin color, texture, turgor are normal, no rashes or significant lesions EYES: normal, Conjunctiva are pink and  non-injected, sclera clear Musculoskeletal:no cyanosis of digits and no clubbing  NEURO: alert & oriented x 3 with fluent speech, no focal motor/sensory deficits  LABORATORY DATA:  I have reviewed the data as listed No results found for this or any previous visit (from the past 48 hour(s)).  Lab Results  Component Value Date   WBC 4.5 01/21/2014   HGB 11.3* 01/21/2014   HCT 34.8 01/21/2014   MCV 92.1 01/21/2014   PLT 295 01/21/2014    ASSESSMENT & PLAN:  Enlarged lymph node Recent lymph node biopsy came back benign. I recommend repeat CT scan in 3 months with her urologist and the patient will call me with test results. This could be followed closely with serial imaging studies. The cause of lymphadenopathy is unknown but sometimes could be associated with connective tissue disorder or reactive related to recurrence of diverticulitis.  Depression with anxiety I tried my best to reassure the patient today.    All questions were answered. The patient knows to call the clinic with any problems, questions or concerns. No barriers to learning was detected.  I spent 15 minutes counseling the patient face to face. The total time spent in the appointment was 20 minutes and more than 50% was on counseling.     [facility], [place], MD 03/26/2014 2:34 PM

## 2014-03-29 ENCOUNTER — Other Ambulatory Visit: Payer: Self-pay

## 2014-03-29 MED ORDER — ZOLMITRIPTAN 5 MG PO TBDP: 5.0000 mg | ORAL_TABLET | ORAL | Status: DC | PRN

## 2014-04-01 ENCOUNTER — Other Ambulatory Visit: Payer: Self-pay

## 2014-04-01 MED ORDER — ALMOTRIPTAN MALATE 12.5 MG PO TABS: 12.5000 mg | ORAL_TABLET | ORAL | Status: DC | PRN

## 2014-04-24 ENCOUNTER — Encounter: Payer: Self-pay | Admitting: Physical Medicine & Rehabilitation

## 2014-04-24 ENCOUNTER — Encounter: Payer: 59 | Attending: Physical Medicine and Rehabilitation | Admitting: Physical Medicine & Rehabilitation

## 2014-04-24 ENCOUNTER — Telehealth: Payer: Self-pay

## 2014-04-24 VITALS — BP 125/59 | HR 113 | Resp 14 | Ht 65.0 in | Wt 141.0 lb

## 2014-04-24 DIAGNOSIS — IMO0001 Reserved for inherently not codable concepts without codable children: Secondary | ICD-10-CM | POA: Diagnosis present

## 2014-04-24 DIAGNOSIS — F0781 Postconcussional syndrome: Secondary | ICD-10-CM | POA: Diagnosis present

## 2014-04-24 DIAGNOSIS — H8113 Benign paroxysmal vertigo, bilateral: Secondary | ICD-10-CM

## 2014-04-24 DIAGNOSIS — M65839 Other synovitis and tenosynovitis, unspecified forearm: Secondary | ICD-10-CM | POA: Insufficient documentation

## 2014-04-24 DIAGNOSIS — M4716 Other spondylosis with myelopathy, lumbar region: Secondary | ICD-10-CM | POA: Diagnosis present

## 2014-04-24 DIAGNOSIS — M5137 Other intervertebral disc degeneration, lumbosacral region: Secondary | ICD-10-CM | POA: Diagnosis present

## 2014-04-24 DIAGNOSIS — G43519 Persistent migraine aura without cerebral infarction, intractable, without status migrainosus: Secondary | ICD-10-CM | POA: Diagnosis present

## 2014-04-24 DIAGNOSIS — H8110 Benign paroxysmal vertigo, unspecified ear: Secondary | ICD-10-CM | POA: Diagnosis present

## 2014-04-24 DIAGNOSIS — G5603 Carpal tunnel syndrome, bilateral upper limbs: Secondary | ICD-10-CM

## 2014-04-24 DIAGNOSIS — M778 Other enthesopathies, not elsewhere classified: Secondary | ICD-10-CM

## 2014-04-24 DIAGNOSIS — M65849 Other synovitis and tenosynovitis, unspecified hand: Secondary | ICD-10-CM

## 2014-04-24 DIAGNOSIS — M5136 Other intervertebral disc degeneration, lumbar region: Secondary | ICD-10-CM

## 2014-04-24 DIAGNOSIS — G5600 Carpal tunnel syndrome, unspecified upper limb: Secondary | ICD-10-CM | POA: Insufficient documentation

## 2014-04-24 DIAGNOSIS — M654 Radial styloid tenosynovitis [de Quervain]: Secondary | ICD-10-CM | POA: Insufficient documentation

## 2014-04-24 DIAGNOSIS — M797 Fibromyalgia: Secondary | ICD-10-CM

## 2014-04-24 DIAGNOSIS — M51379 Other intervertebral disc degeneration, lumbosacral region without mention of lumbar back pain or lower extremity pain: Secondary | ICD-10-CM | POA: Insufficient documentation

## 2014-04-24 MED ORDER — FENTANYL 25 MCG/HR TD PT72: 25.0000 ug | MEDICATED_PATCH | TRANSDERMAL | Status: DC

## 2014-04-24 MED ORDER — HYDROCODONE-ACETAMINOPHEN 5-325 MG PO TABS: 1.0000 | ORAL_TABLET | Freq: Four times a day (QID) | ORAL | Status: DC | PRN

## 2014-04-24 NOTE — Progress Notes (Signed)
Subjective:    Patient ID: Patty Salas, female    DOB: Nov 05, 1955, 58 y.o.   MRN: [id number]  HPI Patty Salas is back regarding her multiple pain issues. Her work up for her lymphadenopathy came back negative which has been relief. There has been some discussion of a PET scan however at some point.   She is just getting over a gout flare in her right foot/first toe. Her right wrist has been tender again and her low back has been acting up as well. She has a new brace for right wrist which helped.   She is using her fentanyl and norco for pain relief.   We have not done botox yet for her migraines due to all of the issues with her lymph node work up and other psychosocial dynamics going on. She still wants to proceed.     Pain Inventory Average Pain 9 Pain Right Now 9 My pain is constant, sharp and stabbing  In the last 24 hours, has pain interfered with the following? General activity 10 Relation with others 10 Enjoyment of life 10 What TIME of day is your pain at its worst? constant all day Sleep (in general) Fair  Pain is worse with: walking, bending, sitting, inactivity and standing Pain improves with: rest, heat/ice, therapy/exercise, medication and injections Relief from Meds: 4  Mobility walk without assistance walk with assistance use a cane use a walker how many minutes can you walk? 10 ability to climb steps?  yes do you drive?  yes needs help with transfers transfers alone Do you have any goals in this area?  yes  Function not employed: date last employed na I need assistance with the following:  meal prep, household duties and shopping Do you have any goals in this area?  yes  Neuro/Psych bladder control problems bowel control problems weakness numbness tremor tingling trouble walking spasms dizziness confusion depression anxiety  Prior Studies Any changes since last visit?  no  Physicians involved in your care Any changes since last visit?   no   Family History  Problem Relation Age of Onset  . Cancer Mother   . Migraines Mother   . Heart disease Father   . Hypertension Father   . Hypertension Brother   . Migraines Brother   . Hypertension Brother   . Migraines Brother   . Migraines Daughter    History   Social History  . Marital Status: Married    Spouse Name: N/A    Number of Children: 1  . Years of Education: COLLEGE1   Occupational History  . HOUSEWIFE    Social History Main Topics  . Smoking status: Never Smoker   . Smokeless tobacco: Never Used  . Alcohol Use: No  . Drug Use: No  . Sexual Activity: None   Other Topics Concern  . None   Social History Narrative  . None   Past Surgical History  Procedure Laterality Date  . Neck surgery  2002  . Foot surgery      lt  . Cystoscopy    . Abdominal adhesion surgery    . Colonoscopy    . Cholecystectomy  1985  . Tonsillectomy  1976  . Appendectomy  1993  . Abdominal hysterectomy  1995  . Colonoscopy    . Lithotripsy    . Lymph node biopsy Right 03/06/2014    Procedure: right groin LYMPH NODE BIOPSY;  Surgeon: Merrie Roof, MD;  Location: [facility];  Service: General;  Laterality: Right;   Past Medical History  Diagnosis Date  . Myalgia and myositis, unspecified   . Fibromyalgia   . Anxiety   . Depression   . Cervical facet syndrome   . Calcifying tendinitis of shoulder   . Carpal tunnel syndrome   . Thoracic radiculopathy   . Headache(784.0)   . Hypertension   . Bipolar affective   . Gout   . Asthma   . Renal calculi   . Diverticulosis   . PTSD (post-traumatic stress disorder)   . Full dentures   . Wears glasses    BP 125/59  Pulse 113  Resp 14  Ht 5\' 5"  (1.651 m)  Wt 141 lb (63.957 kg)  BMI 23.46 kg/m2  SpO2 94%  Opioid Risk Score:   Fall Risk Score: High Fall Risk (>13 points) (pt educated on fall risk, brochure given to pt previously)    Review of Systems  Constitutional: Positive for fever,  chills and unexpected weight change.  Cardiovascular: Positive for leg swelling.  Gastrointestinal: Positive for nausea, vomiting, abdominal pain and diarrhea.  Genitourinary:       Bowel and bladder control problems  Musculoskeletal: Positive for back pain and neck pain.  Skin: Positive for rash.  Neurological: Positive for dizziness, tremors, weakness, numbness and headaches.       Tingling, spasms  Psychiatric/Behavioral: Positive for confusion. The patient is nervous/anxious.        Depression  All other systems reviewed and are negative.      Objective:   Physical Exam   Constitutional: She is oriented to person, place, and time. She appears well-developed and well-nourished. She is wearing a foam cervical collar.  HENT:  Head: Normocephalic and atraumatic.  Eyes: Conjunctivae and EOM are normal. Pupils are equal, round, and reactive to light.  Neck: Neck supple.  Cardiovascular: Normal rate and regular rhythm.  Pulmonary/Chest: Effort normal and breath sounds normal.  Abdominal: Soft. Bowel sounds are normal.  Musculoskeletal:  Posture rigid due to pain and cervical collar. Did not perform a myofascial exam. Sensation intact. Finkelsteins test positive right wrist. EPB/APL tender. Also has TP's in right lower lumbar spine Neurological: She is alert and oriented to person, place, and time. She has normal strength with some pain inhibition weakness in the RUE. Had difficulty tracking to the right and left---appeared to have nystagmus but closed her eyes to decreased the discomfort.  Gait pattern is stable. Balance improved  Psychiatric: Her speech is normal and behavior is normal. Judgment normal. She is very calm and collected today. In good spirits.   Assessment & Plan:   ASSESSMENT:  1. History of fibromyalgia with myofascial pain and multiple trigger points.  2. Chronic migraine headaches.  3. Lumbar degenerative disk disease, L4-5  4. History of nephrolithiasis. Cysts  on left kidney  5. Right flexor pollicis longus tendonitis  6. Right CTS  7. DeQuervain's Tenosynovitis  8. Fall with concussion and PCS including increased headaches and BPPV--symptoms appear better.  9. Lymphoma?---bx pending 6/5    PLAN:  1.Still consider vestibular assessment for BPPV.  2. After informed consent and preparation of the skin with isopropyl alcohol, I injected the right lumbar paraspinals (x2).  The patient tolerated well, and no complications were experienced. Post-injection instructions were provided.  3. After informed consent and preparation of the skin with betadine and isopropyl alcohol, I injected 6mg  (1cc) of celestone and 2cc of 1% lidocaine around the APL/EPB via anterior approach. Additionally, aspiration  was performed prior to injection. The patient tolerated well, and no complications were encountered. Afterward the area was cleaned and dressed. Post- injection instructions were provided.  5. Will look forward to botox for migraine headaches this fall 6. I refilled her Norco 5/325 at #60 which she had taken a few more of because of pain. Also refilled duragesic patch  5. She will follow up with nursing in one month. 30 minutes of face to face patient care time were spent during this visit. All questions were encouraged and answered.

## 2014-04-24 NOTE — Patient Instructions (Signed)
PLEASE CALL ME WITH ANY PROBLEMS OR QUESTIONS (#[phone]).      

## 2014-04-24 NOTE — Telephone Encounter (Signed)
Mickel Baas @ [facility] called stating patient's husband had contacted them to see if Dr. Naaman Plummer had sent the RX for patient's ankle device.   Mickel Baas is requesting a RX for patient's "ankle device" if appropriate.  Contact [phone] ext [phone]. Please advise

## 2014-04-27 NOTE — Telephone Encounter (Signed)
I don't recall discussing or ordering a JAS device. Just read my note also

## 2014-05-18 ENCOUNTER — Telehealth: Payer: Self-pay | Admitting: Hematology and Oncology

## 2014-05-18 ENCOUNTER — Other Ambulatory Visit: Payer: Self-pay | Admitting: Hematology and Oncology

## 2014-05-18 ENCOUNTER — Telehealth: Payer: Self-pay | Admitting: *Deleted

## 2014-05-18 DIAGNOSIS — R599 Enlarged lymph nodes, unspecified: Secondary | ICD-10-CM

## 2014-05-18 NOTE — Telephone Encounter (Signed)
Informed pt of PET ordered and we will have to wait to see If insurance will approve it.  She verbalized understanding.  She spent a long time informing nurse of a lot of symptoms she is having including fevers, chills, n/v/d, and severe left shoulder pain that radiates down her left side of back to kidney and down left leg.  States has to sleep w/ heating pad on left side every night.  She also reports some blood in her urine a few days ago which has cleared up now.  Instructed pt to contact her PCP regarding the fever/chills, n/v/d and to call her Urologist regarding the blood in her urine.  She verbalized understanding.

## 2014-05-18 NOTE — Telephone Encounter (Signed)
I placed order for next week

## 2014-05-18 NOTE — Telephone Encounter (Signed)
Pt requests PET scan as she says was recommended by Dr. Alvy Bimler.  Pt says was going to wait for Dr. Junious Silk to order a CT scan, but she has not seen Dr. Junious Silk yet.  She has met her insurance deductible for this year and thinks it would be a good time to get the PET scan done if Dr. Alvy Bimler still recommends it.

## 2014-05-18 NOTE — Telephone Encounter (Signed)
Ok.  She wants to know when it should be done? Will you order it? We won't know if insurance will cover it until it is ordered.

## 2014-05-18 NOTE — Telephone Encounter (Signed)
Insurance may not pay for it with last negative biopsy but worth trying

## 2014-05-18 NOTE — Telephone Encounter (Signed)
Lft msg for pt confirming ov per 08/17 POF, mailed updated schedule to pt......KJ

## 2014-05-26 ENCOUNTER — Encounter: Payer: 59 | Admitting: Registered Nurse

## 2014-05-28 ENCOUNTER — Telehealth: Payer: Self-pay

## 2014-05-28 ENCOUNTER — Telehealth: Payer: Self-pay | Admitting: *Deleted

## 2014-05-28 ENCOUNTER — Encounter: Payer: Self-pay | Admitting: Hematology and Oncology

## 2014-05-28 ENCOUNTER — Ambulatory Visit (HOSPITAL_BASED_OUTPATIENT_CLINIC_OR_DEPARTMENT_OTHER): Payer: 59 | Admitting: Hematology and Oncology

## 2014-05-28 VITALS — BP 145/77 | HR 108 | Temp 98.6°F | Resp 17 | Ht 65.0 in | Wt 142.1 lb

## 2014-05-28 DIAGNOSIS — M797 Fibromyalgia: Secondary | ICD-10-CM

## 2014-05-28 DIAGNOSIS — IMO0001 Reserved for inherently not codable concepts without codable children: Secondary | ICD-10-CM

## 2014-05-28 DIAGNOSIS — R599 Enlarged lymph nodes, unspecified: Secondary | ICD-10-CM

## 2014-05-28 NOTE — Assessment & Plan Note (Signed)
She has chronic pain syndrome. She will continue close followup with pain management.

## 2014-05-28 NOTE — Telephone Encounter (Signed)
Patient states she is having pain under her left shoulder blade down to her buttocks. Patient called to see if she could take 3 to 4 Hydrocodone. Explained to patient that she can take up to 4 Hydrocodone a day, but the medication must last 30 days. Patient verbalized understanding.

## 2014-05-28 NOTE — Telephone Encounter (Signed)
Informed pt PET scan scheduled Tuesday Sept 1st at 11 am at [facility] to arrive at 10:45 am and NPO for 6 hrs prior to exam.  She verbalized understanding.

## 2014-05-28 NOTE — Assessment & Plan Note (Signed)
She had incidental finding of lymphadenopathy on CT scan. Excisional lymph node biopsy came back benign. I recommend PET/CT scan for further evaluation and reevaluate whether the lymphadenopathy is still there. I will call the patient would test results. If there is persistent lymphadenopathy, we will repeat biopsy, according to result PET scan.

## 2014-05-28 NOTE — Progress Notes (Signed)
[facility] OFFICE PROGRESS NOTE  [name], MD SUMMARY OF HEMATOLOGIC HISTORY: The patient is seen  here because of nonspecific lymphadenopathy detected on recent CT scan. This patient have chronic kidney stone disease. On 01/01/2014, she had CT scan without contrast for evaluation of kidney stones and was found to have multiple periaortic lymphadenopathy. The patient denies any recent infection. She does have history of diverticulosis and she thought she may have a bout of diverticulitis recently. PET CT scan dated 01/26/2014 show diffuse lymphadenopathy. On 03/06/2014, excisional lymph node biopsy is benign  INTERVAL HISTORY: Patty Salas 58 y.o. female returns for further followup. She denies new lymphadenopathy. She continues to have chronic musculoskeletal pain and follows closely a pain clinic.  I have reviewed the past medical history, past surgical history, social history and family history with the patient and they are unchanged from previous note.  ALLERGIES:  is allergic to divalproex sodium; hydrochlorothiazide; imitrex; latex; mellaril; olanzapine; penicillins; topamax; aripiprazole; aspirin; dalmane; darifenacin hydrobromide er; metoclopramide hcl; seroquel; statins; stelazine; thorazine; abilify; allopurinol; barium-containing compounds; colchicine; diflucan; duract; hyoscyamine; iohexol; ivp dye; lyrica; myrbetriq; nsaids; reglan; renografin; risperdal; urocit - k; zyprexa; avelox; diclofenac; doxycycline; e-mycin; flagyl; lamictal; pregabalin; and risperidone.  MEDICATIONS:  Current Outpatient Prescriptions  Medication Sig Dispense Refill  . almotriptan (AXERT) 12.5 MG tablet Take 1 tablet (12.5 mg total) by mouth as needed for migraine (Max 2 tabs per week).  24 tablet  1  . bisacodyl (DULCOLAX) 5 MG EC tablet Take 10 mg by mouth daily as needed.      . cyclobenzaprine (FLEXERIL) 10 MG tablet Take 10 mg by mouth 3 (three) times daily.      Marland Kitchen docusate  sodium (COLACE) 100 MG capsule Take 100 mg by mouth daily as needed.      Marland Kitchen estradiol (VIVELLE-DOT) 0.1 MG/24HR Place 1 patch onto the skin 2 (two) times a week.      . fentaNYL (DURAGESIC - DOSED MCG/HR) 25 MCG/HR patch Place 1 patch (25 mcg total) onto the skin every 3 (three) days.  10 patch  0  . furosemide (LASIX) 40 MG tablet Take 40-80 mg by mouth daily as needed.      . gabapentin (NEURONTIN) 300 MG capsule Take 600 mg by mouth at bedtime.      Marland Kitchen HYDROcodone-acetaminophen (NORCO/VICODIN) 5-325 MG per tablet Take 1 tablet by mouth every 6 (six) hours as needed for moderate pain.  90 tablet  0  . hyoscyamine (LEVSIN, ANASPAZ) 0.125 MG tablet Take 0.125 mg by mouth every 4 (four) hours as needed.      Marland Kitchen LORazepam (ATIVAN) 1 MG tablet Take 1 mg by mouth 3 (three) times daily.      Marland Kitchen PROAIR HFA 108 (90 BASE) MCG/ACT inhaler Inhale 2 puffs into the lungs as directed.      . promethazine (PHENERGAN) 25 MG tablet every 6 (six) hours as needed.       . Trospium Chloride 60 MG CP24 Take 1 capsule by mouth Daily.      . Vitamin D, Ergocalciferol, (DRISDOL) 50000 UNITS CAPS capsule       . zolmitriptan (ZOMIG-ZMT) 5 MG disintegrating tablet Take 1 tablet (5 mg total) by mouth as needed for migraine (May use up to 2 tabs per week).  27 tablet  1   No current facility-administered medications for this visit.     REVIEW OF SYSTEMS:   Constitutional: Denies fevers, chills or night sweats Eyes: Denies blurriness of  vision Ears, nose, mouth, throat, and face: Denies mucositis or sore throat Respiratory: Denies cough, dyspnea or wheezes Cardiovascular: Denies palpitation, chest discomfort or lower extremity swelling Gastrointestinal:  Denies nausea, heartburn or change in bowel habits Skin: Denies abnormal skin rashes Lymphatics: Denies new lymphadenopathy or easy bruising Neurological:Denies numbness, tingling or new weaknesses Behavioral/Psych: Mood is stable, no new changes  All other systems  were reviewed with the patient and are negative.  PHYSICAL EXAMINATION: ECOG PERFORMANCE STATUS: 1 - Symptomatic but completely ambulatory  Filed Vitals:   05/28/14 1144  BP: 145/77  Pulse: 108  Temp: 98.6 F (37 C)  Resp: 17   Filed Weights   05/28/14 1144  Weight: 142 lb 1.6 oz (64.456 kg)    GENERAL:alert, no distress and comfortable. She looks thin but not cachectic SKIN: skin color, texture, turgor are normal, no rashes or significant lesions EYES: normal, Conjunctiva are pink and non-injected, sclera clear OROPHARYNX:no exudate, no erythema and lips, buccal mucosa, and tongue normal  NECK: supple, thyroid normal size, non-tender, without nodularity LYMPH:  no palpable lymphadenopathy in the cervical, axillary or inguinal LUNGS: clear to auscultation and percussion with normal breathing effort HEART: regular rate & rhythm and no murmurs and no lower extremity edema ABDOMEN:abdomen soft, non-tender and normal bowel sounds. No splenomegaly Musculoskeletal:no cyanosis of digits and no clubbing  NEURO: alert & oriented x 3 with fluent speech, no focal motor/sensory deficits  LABORATORY DATA:  I have reviewed the data as listed No results found for this or any previous visit (from the past 48 hour(s)).  Lab Results  Component Value Date   WBC 4.5 01/21/2014   HGB 11.3* 01/21/2014   HCT 34.8 01/21/2014   MCV 92.1 01/21/2014   PLT 295 01/21/2014   ASSESSMENT & PLAN:  Enlarged lymph node She had incidental finding of lymphadenopathy on CT scan. Excisional lymph node biopsy came back benign. I recommend PET/CT scan for further evaluation and reevaluate whether the lymphadenopathy is still there. I will call the patient would test results. If there is persistent lymphadenopathy, we will repeat biopsy, according to result PET scan.  Fibromyalgia/myofascial pain syndrome She has chronic pain syndrome. She will continue close followup with pain management.   I will proceed to  order a PET CT scan. All questions were answered. The patient knows to call the clinic with any problems, questions or concerns. No barriers to learning was detected.  I spent 15 minutes counseling the patient face to face. The total time spent in the appointment was 20 minutes and more than 50% was on counseling.     [facility], [place], MD 05/28/2014 1:43 PM

## 2014-06-01 ENCOUNTER — Encounter: Payer: Self-pay | Admitting: Registered Nurse

## 2014-06-01 ENCOUNTER — Encounter: Payer: 59 | Attending: Physical Medicine and Rehabilitation | Admitting: Registered Nurse

## 2014-06-01 VITALS — BP 136/70 | HR 100 | Resp 14 | Ht 66.0 in | Wt 144.0 lb

## 2014-06-01 DIAGNOSIS — M5137 Other intervertebral disc degeneration, lumbosacral region: Secondary | ICD-10-CM | POA: Diagnosis present

## 2014-06-01 DIAGNOSIS — M5136 Other intervertebral disc degeneration, lumbar region: Secondary | ICD-10-CM

## 2014-06-01 DIAGNOSIS — IMO0001 Reserved for inherently not codable concepts without codable children: Secondary | ICD-10-CM

## 2014-06-01 DIAGNOSIS — F0781 Postconcussional syndrome: Secondary | ICD-10-CM | POA: Diagnosis present

## 2014-06-01 DIAGNOSIS — M7062 Trochanteric bursitis, left hip: Secondary | ICD-10-CM

## 2014-06-01 DIAGNOSIS — M797 Fibromyalgia: Secondary | ICD-10-CM

## 2014-06-01 DIAGNOSIS — Z5181 Encounter for therapeutic drug level monitoring: Secondary | ICD-10-CM

## 2014-06-01 DIAGNOSIS — Z79899 Other long term (current) drug therapy: Secondary | ICD-10-CM

## 2014-06-01 DIAGNOSIS — M4716 Other spondylosis with myelopathy, lumbar region: Secondary | ICD-10-CM | POA: Insufficient documentation

## 2014-06-01 DIAGNOSIS — G43519 Persistent migraine aura without cerebral infarction, intractable, without status migrainosus: Secondary | ICD-10-CM | POA: Insufficient documentation

## 2014-06-01 DIAGNOSIS — M51379 Other intervertebral disc degeneration, lumbosacral region without mention of lumbar back pain or lower extremity pain: Secondary | ICD-10-CM | POA: Insufficient documentation

## 2014-06-01 DIAGNOSIS — M76899 Other specified enthesopathies of unspecified lower limb, excluding foot: Secondary | ICD-10-CM

## 2014-06-01 MED ORDER — HYDROCODONE-ACETAMINOPHEN 5-325 MG PO TABS: 1.0000 | ORAL_TABLET | Freq: Four times a day (QID) | ORAL | Status: DC | PRN

## 2014-06-01 MED ORDER — FENTANYL 25 MCG/HR TD PT72: 25.0000 ug | MEDICATED_PATCH | TRANSDERMAL | Status: DC

## 2014-06-01 NOTE — Progress Notes (Signed)
Subjective:    Patient ID: Patty Salas, female    DOB: July 01, 1956, 58 y.o.   MRN: [id number]  HPI: [name] is a 58 year old female who returns for follow up for chronic pain and medication refill. She says her pain is located in her neck,  Left shoulder blade, mid- lower back, and left hip. She rates her pain 10. She says she has been alternating heat and ice therapy in conjunction with medication management for pain relief. Her current exercise program she is performing zip hallow exercises and walking.  She says she is scheduled for a PET scan in the morning.   Pain Inventory Average Pain 9 Pain Right Now 10 My pain is constant, sharp and stabbing  In the last 24 hours, has pain interfered with the following? General activity 10 Relation with others 10 Enjoyment of life 10 What TIME of day is your pain at its worst? constant all day Sleep (in general) Fair  Pain is worse with: walking, bending, sitting, inactivity, standing and some activites Pain improves with: rest, heat/ice, medication and injections Relief from Meds: 4  Mobility walk without assistance walk with assistance use a cane use a walker how many minutes can you walk? 5-10 ability to climb steps?  yes do you drive?  yes needs help with transfers transfers alone Do you have any goals in this area?  yes  Function not employed: date last employed na I need assistance with the following:  meal prep, household duties and shopping Do you have any goals in this area?  yes  Neuro/Psych bladder control problems bowel control problems weakness numbness tremor tingling trouble walking spasms dizziness confusion depression anxiety  Prior Studies Any changes since last visit?  no  Physicians involved in your care Any changes since last visit?  no   Family History  Problem Relation Age of Onset  . Cancer Mother   . Migraines Mother   . Heart disease Father   . Hypertension Father     . Hypertension Brother   . Migraines Brother   . Hypertension Brother   . Migraines Brother   . Migraines Daughter    History   Social History  . Marital Status: Married    Spouse Name: N/A    Number of Children: 1  . Years of Education: COLLEGE1   Occupational History  . HOUSEWIFE    Social History Main Topics  . Smoking status: Never Smoker   . Smokeless tobacco: Never Used  . Alcohol Use: No  . Drug Use: No  . Sexual Activity: None   Other Topics Concern  . None   Social History Narrative  . None   Past Surgical History  Procedure Laterality Date  . Neck surgery  2002  . Foot surgery      lt  . Cystoscopy    . Abdominal adhesion surgery    . Colonoscopy    . Cholecystectomy  1985  . Tonsillectomy  1976  . Appendectomy  1993  . Abdominal hysterectomy  1995  . Colonoscopy    . Lithotripsy    . Lymph node biopsy Right 03/06/2014    Procedure: right groin LYMPH NODE BIOPSY;  Surgeon: Merrie Roof, MD;  Location: [facility];  Service: General;  Laterality: Right;   Past Medical History  Diagnosis Date  . Myalgia and myositis, unspecified   . Fibromyalgia   . Anxiety   . Depression   .  Cervical facet syndrome   . Calcifying tendinitis of shoulder   . Carpal tunnel syndrome   . Thoracic radiculopathy   . Headache(784.0)   . Hypertension   . Bipolar affective   . Gout   . Asthma   . Renal calculi   . Diverticulosis   . PTSD (post-traumatic stress disorder)   . Full dentures   . Wears glasses    BP 136/70  Pulse 100  Resp 14  Ht 5\' 6"  (1.676 m)  Wt 144 lb (65.318 kg)  BMI 23.25 kg/m2  SpO2 98%  Opioid Risk Score:   Fall Risk Score: Moderate Fall Risk (6-13 points) (pt educated, declined handout)    Review of Systems  Constitutional: Positive for chills, appetite change and unexpected weight change.  Cardiovascular: Positive for leg swelling.  Gastrointestinal: Positive for nausea, vomiting and abdominal distention.   Genitourinary:       Bowel and bladder control problems  Neurological: Positive for dizziness, tremors, weakness and numbness.       Spasms, tingling  Psychiatric/Behavioral: Positive for confusion. The patient is nervous/anxious.        Depression  All other systems reviewed and are negative.      Objective:   Physical Exam  Nursing note and vitals reviewed. Constitutional: She is oriented to person, place, and time. She appears well-developed and well-nourished.  HENT:  Head: Normocephalic and atraumatic.  Neck: Normal range of motion. Neck supple.  Cervical Paraspinal Tenderness: C-3- C-5 Decreased ROM on the Right 20 Degrees  Cardiovascular: Normal rate and regular rhythm.   Pulmonary/Chest: Effort normal and breath sounds normal.  Musculoskeletal:  Normal Muscle Bulk and Muscle testing Reveals: Upper Extremities: Full ROM and Muscle Strength 5/5 Left Spine of Scapula Tenderness Spinal Forward Flexion 30 Degrees and Extension 10 Degrees Thoracic Paraspinal Tenderness: T-4- T-7 Lumbar Paraspinal Tenderness: L-3- L-5 Left Greater Trochanteric Tenderness Arises from chair with ease Narrow based gait    Neurological: She is alert and oriented to person, place, and time.  Skin: Skin is warm and dry.  Psychiatric: She has a normal mood and affect.          Assessment & Plan:  1. History of fibromyalgia with myofascial pain and multiple trigger points. Continue with Heat and exercise Regime. Continue with gabapentin.  2. Chronic migraine headaches. Awaiting Botox Treatment.  3. Lumbar degenerative disk disease, L4-5 :  Refilled: Fentanyl Patch 25 mcg one patch every three days #10 and Hydrocodone 5/325 mg one tablet every 6 hours as needed #90.  4. History of nephrolithiasis. Cysts on left kidney: Urology Following  5. Right CTS: No Complaints today. Wrist stabilizer intact.   30 minutes of face to face patient care time was spent during this visit. All questions were  encouraged and answered.   F/U in 1 month

## 2014-06-02 ENCOUNTER — Encounter (HOSPITAL_COMMUNITY): Payer: Self-pay

## 2014-06-02 ENCOUNTER — Ambulatory Visit (HOSPITAL_COMMUNITY)
Admission: RE | Admit: 2014-06-02 | Discharge: 2014-06-02 | Disposition: A | Discharge status: Home / Self Care | Payer: 59 | Source: Ambulatory Visit | Attending: Hematology and Oncology | Admitting: Hematology and Oncology

## 2014-06-02 DIAGNOSIS — R599 Enlarged lymph nodes, unspecified: Secondary | ICD-10-CM | POA: Insufficient documentation

## 2014-06-02 LAB — GLUCOSE, CAPILLARY: Glucose-Capillary: 87 mg/dL (ref 70–99)

## 2014-06-02 MED ORDER — FLUDEOXYGLUCOSE F - 18 (FDG) INJECTION: 7.1000 | Freq: Once | INTRAVENOUS | Status: AC | PRN

## 2014-06-04 ENCOUNTER — Telehealth: Payer: Self-pay | Admitting: *Deleted

## 2014-06-04 NOTE — Telephone Encounter (Signed)
Pt left 2 messages asking about results of her PET scan from Tuesday.  States she is very anxious waiting for results.

## 2014-06-05 ENCOUNTER — Other Ambulatory Visit: Payer: Self-pay | Admitting: Hematology and Oncology

## 2014-06-05 ENCOUNTER — Telehealth: Payer: Self-pay | Admitting: Hematology and Oncology

## 2014-06-05 DIAGNOSIS — R599 Enlarged lymph nodes, unspecified: Secondary | ICD-10-CM

## 2014-06-05 NOTE — Telephone Encounter (Signed)
I reviewed the report of PET scan with the patient. Preliminary, there is no definitive signs of cancer. The abnormal lymphadenopathy are mildly hypermetabolic, nonspecific. Low grade lymphoproliferative disorder cannot be excluded. I told the patient and my recommendation would be observation only with CT scan in 6 months. I do not recommend repeating the biopsy as it would not change management even if this is low grade lymphoproliferative disorder as the patient is not symptomatic. I plan to see her back in 6 months with repeat history, physical examination and CT scan.

## 2014-06-09 ENCOUNTER — Telehealth: Payer: Self-pay | Admitting: *Deleted

## 2014-06-09 NOTE — Telephone Encounter (Addendum)
Patty Salas called to leave a message for Patty Salas NO about her PET scan.  Results are in EPIC  I Reviewed PET Scan Results. Oncology Following

## 2014-06-10 ENCOUNTER — Telehealth: Payer: Self-pay | Admitting: Hematology and Oncology

## 2014-06-10 NOTE — Telephone Encounter (Signed)
s.w. pt and advised on March appt....pt ok and aware °

## 2014-06-29 ENCOUNTER — Other Ambulatory Visit: Payer: Self-pay | Admitting: *Deleted

## 2014-06-29 ENCOUNTER — Telehealth: Payer: Self-pay | Admitting: *Deleted

## 2014-06-29 DIAGNOSIS — G43519 Persistent migraine aura without cerebral infarction, intractable, without status migrainosus: Secondary | ICD-10-CM

## 2014-06-29 DIAGNOSIS — M5136 Other intervertebral disc degeneration, lumbar region: Secondary | ICD-10-CM

## 2014-06-29 DIAGNOSIS — F0781 Postconcussional syndrome: Secondary | ICD-10-CM

## 2014-06-29 DIAGNOSIS — M4716 Other spondylosis with myelopathy, lumbar region: Secondary | ICD-10-CM

## 2014-06-29 DIAGNOSIS — M797 Fibromyalgia: Secondary | ICD-10-CM

## 2014-06-29 MED ORDER — HYDROCODONE-ACETAMINOPHEN 5-325 MG PO TABS: 1.0000 | ORAL_TABLET | Freq: Four times a day (QID) | ORAL | Status: DC | PRN

## 2014-06-29 MED ORDER — FENTANYL 25 MCG/HR TD PT72: 25.0000 ug | MEDICATED_PATCH | TRANSDERMAL | Status: DC

## 2014-06-29 NOTE — Telephone Encounter (Signed)
Cancelled appt because she has the flu.  Made next appt to see Dr. Naaman Plummer on 07/29/14 but will run out of [place] tomorrow.  Needs a refill on the Norco

## 2014-06-29 NOTE — Telephone Encounter (Signed)
Printed rx for hydrocodone and fentanyl for Dr Naaman Plummer to sign. [place] notified.

## 2014-06-30 ENCOUNTER — Encounter: Payer: 59 | Admitting: Registered Nurse

## 2014-07-20 ENCOUNTER — Telehealth: Payer: Self-pay | Admitting: Nurse Practitioner

## 2014-07-20 NOTE — Telephone Encounter (Signed)
Patient called to notify Dr. Alvy Bimler that she will be seeing Dr. Tresa Moore at [facility] tomorrow, 10/20, to be evaluated for kidney stone vs kidney infection. Dr. Alvy Bimler to be informed.

## 2014-07-28 ENCOUNTER — Other Ambulatory Visit: Payer: Self-pay

## 2014-07-29 ENCOUNTER — Encounter: Payer: 59 | Attending: Physical Medicine and Rehabilitation | Admitting: Physical Medicine & Rehabilitation

## 2014-07-29 ENCOUNTER — Encounter: Payer: Self-pay | Admitting: Physical Medicine & Rehabilitation

## 2014-07-29 ENCOUNTER — Other Ambulatory Visit: Payer: Self-pay | Admitting: Physical Medicine & Rehabilitation

## 2014-07-29 VITALS — BP 141/69 | HR 113 | Resp 14 | Ht 66.0 in | Wt 140.0 lb

## 2014-07-29 DIAGNOSIS — G894 Chronic pain syndrome: Secondary | ICD-10-CM | POA: Diagnosis not present

## 2014-07-29 DIAGNOSIS — M4716 Other spondylosis with myelopathy, lumbar region: Secondary | ICD-10-CM

## 2014-07-29 DIAGNOSIS — Z79899 Other long term (current) drug therapy: Secondary | ICD-10-CM | POA: Diagnosis present

## 2014-07-29 DIAGNOSIS — M5136 Other intervertebral disc degeneration, lumbar region: Secondary | ICD-10-CM | POA: Diagnosis present

## 2014-07-29 DIAGNOSIS — F0781 Postconcussional syndrome: Secondary | ICD-10-CM

## 2014-07-29 DIAGNOSIS — G43519 Persistent migraine aura without cerebral infarction, intractable, without status migrainosus: Secondary | ICD-10-CM

## 2014-07-29 DIAGNOSIS — G5600 Carpal tunnel syndrome, unspecified upper limb: Secondary | ICD-10-CM

## 2014-07-29 DIAGNOSIS — Z5181 Encounter for therapeutic drug level monitoring: Secondary | ICD-10-CM | POA: Diagnosis present

## 2014-07-29 DIAGNOSIS — M797 Fibromyalgia: Secondary | ICD-10-CM | POA: Diagnosis present

## 2014-07-29 DIAGNOSIS — M51369 Other intervertebral disc degeneration, lumbar region without mention of lumbar back pain or lower extremity pain: Secondary | ICD-10-CM

## 2014-07-29 DIAGNOSIS — M47812 Spondylosis without myelopathy or radiculopathy, cervical region: Secondary | ICD-10-CM

## 2014-07-29 MED ORDER — FENTANYL 25 MCG/HR TD PT72: 25.0000 ug | MEDICATED_PATCH | TRANSDERMAL | Status: DC

## 2014-07-29 MED ORDER — HYDROCODONE-ACETAMINOPHEN 5-325 MG PO TABS: 1.0000 | ORAL_TABLET | Freq: Four times a day (QID) | ORAL | Status: DC | PRN

## 2014-07-29 NOTE — Patient Instructions (Signed)
PLEASE CALL ME WITH ANY PROBLEMS OR QUESTIONS (#[phone]).      

## 2014-07-29 NOTE — Progress Notes (Signed)
Subjective:    Patient ID: Patty Salas, female    DOB: 09/08/1956, 58 y.o.   MRN: [id number]  HPI  Kerianna is back regarding her chronic pain. She has noted that her back pain has gradually increased over the last few months. She is interested in pursuing another ESI given her good results in the past. Her pain is primarily in her left low back with radiation down the leg to the top of the left foot.  She has continued struggles with her myofascial pain as well.   She is seen by [facility] regarding her nephrolithiasis. Stones remain an issue  Maleni uses hydrocodone and fentanyl for pain control. She has been compliant.   Pain Inventory Average Pain 10 Pain Right Now 10 My pain is constant, sharp and stabbing  In the last 24 hours, has pain interfered with the following? General activity 10 Relation with others 10 Enjoyment of life 10 What TIME of day is your pain at its worst? morning, daytime, evening, night Sleep (in general) Fair  Pain is worse with: walking, bending, sitting and standing Pain improves with: rest, heat/ice and medication Relief from Meds: 2  Mobility walk without assistance use a cane use a walker ability to climb steps?  yes do you drive?  yes Do you have any goals in this area?  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  yes  Neuro/Psych bladder control problems bowel control problems weakness numbness tremor tingling spasms confusion depression anxiety  Prior Studies Any changes since last visit?  yes  Physicians involved in your care Any changes since last visit?  yes   Family History  Problem Relation Age of Onset  . Cancer Mother   . Migraines Mother   . Heart disease Father   . Hypertension Father   . Hypertension Brother   . Migraines Brother   . Hypertension Brother   .  Migraines Brother   . Migraines Daughter    History   Social History  . Marital Status: Married    Spouse Name: N/A    Number of Children: 1  . Years of Education: COLLEGE1   Occupational History  . HOUSEWIFE    Social History Main Topics  . Smoking status: Never Smoker   . Smokeless tobacco: Never Used  . Alcohol Use: No  . Drug Use: No  . Sexual Activity: None   Other Topics Concern  . None   Social History Narrative  . None   Past Surgical History  Procedure Laterality Date  . Neck surgery  2002  . Foot surgery      lt  . Cystoscopy    . Abdominal adhesion surgery    . Colonoscopy    . Cholecystectomy  1985  . Tonsillectomy  1976  . Appendectomy  1993  . Abdominal hysterectomy  1995  . Colonoscopy    . Lithotripsy    . Lymph node biopsy Right 03/06/2014    Procedure: right groin LYMPH NODE BIOPSY;  Surgeon: Merrie Roof, MD;  Location: [place];  Service: General;  Laterality: Right;   Past Medical History  Diagnosis Date  . Myalgia and myositis, unspecified   . Fibromyalgia   . Anxiety   . Depression   . Cervical facet syndrome   . Calcifying tendinitis of shoulder   . Carpal  tunnel syndrome   . Thoracic radiculopathy   . Headache(784.0)   . Hypertension   . Bipolar affective   . Gout   . Asthma   . Renal calculi   . Diverticulosis   . PTSD (post-traumatic stress disorder)   . Full dentures   . Wears glasses    There were no vitals taken for this visit.  Opioid Risk Score:   Fall Risk Score: Moderate Fall Risk (6-13 points) Review of Systems     Objective:   Physical Exam  Constitutional: She is oriented to person, place, and time. She appears well-developed and well-nourished. She is wearing a foam cervical collar.  HENT:  Head: Normocephalic and atraumatic.  Eyes: Conjunctivae and EOM are normal. Pupils are equal, round, and reactive to light.  Neck: Neck supple.  Cardiovascular: Normal rate and regular rhythm.    Pulmonary/Chest: Effort normal and breath sounds normal.  Abdominal: Soft. Bowel sounds are normal.  Musculoskeletal:  Posture rigid due to pain and cervical collar. Did not perform a myofascial exam. Sensation intact. Finkelsteins test positive right wrist. EPB/APL tender. Also has TP's in right lower lumbar spine. Limited flexion in lumbar spine due to pain.  Neurological: She is alert and oriented to person, place, and time. She has normal strength with some pain inhibition weakness in the RUE. Had difficulty tracking to the right and left---appeared to have nystagmus but closed her eyes to decreased the discomfort.  Gait pattern is stable. Balance improved   Psychiatric: Her speech is normal and behavior is normal. Judgment normal. She is very calm and collected today. In good spirits.  Assessment & Plan:   ASSESSMENT:  1. History of fibromyalgia with myofascial pain and multiple trigger points.  2. Chronic migraine headaches.  3. Lumbar degenerative disk disease, L4-5  4. History of nephrolithiasis. Cysts on left kidney  5. Right flexor pollicis longus tendonitis  6. Right CTS  7. DeQuervain's Tenosynovitis  8. Fall with concussion and PCS including increased headaches and BPPV--symptoms appear better.  9. Lymphoma?---bx pending 6/5    PLAN:  1.Still consider vestibular assessment for BPPV.  2. After informed consent and preparation of the skin with isopropyl alcohol, I injected the right and left lumbar paraspinals (x2), left upper and mid trap (x2). ---four total injections. The patient tolerated well, and no complications were experienced. Post-injection instructions were provided.  3. Will refer patient for repeat L4-5 translaminar paracentral to the left ESI per Dr. Letta Pate, potentially next month.  5. Consider botox for migraine headaches at some point in the near future.   6. I refilled her Norco 5/325 at #60 which she had taken a few more of because of pain. Also refilled  duragesic patch  5. She will follow up with Dr. Letta Pate in about one month. 30 minutes of face to face patient care time were spent during this visit. All questions were encouraged and answered.

## 2014-07-30 LAB — PMP ALCOHOL METABOLITE (ETG): Ethyl Glucuronide (EtG): NEGATIVE ng/mL

## 2014-08-03 LAB — OPIATES/OPIOIDS (LC/MS-MS)
CODEINE URINE: NEGATIVE ng/mL (ref <50)
HYDROCODONE: 342 ng/mL (ref <50)
Hydromorphone: 124 ng/mL (ref <50)
Morphine Urine: NEGATIVE ng/mL (ref <50)
NORHYDROCODONE, UR: 549 ng/mL (ref <50)
NOROXYCODONE, UR: NEGATIVE ng/mL (ref <50)
OXYMORPHONE, URINE: NEGATIVE ng/mL (ref <50)
Oxycodone, ur: NEGATIVE ng/mL (ref <50)

## 2014-08-03 LAB — FENTANYL (GC/LC/MS), URINE
FENTANYL (GC/MS) CONFIRM: 2.1 ng/mL (ref <0.5)
Norfentanyl, confirm: 55.2 ng/mL (ref <0.5)

## 2014-08-03 LAB — BENZODIAZEPINES (GC/LC/MS), URINE
Alprazolam metabolite (GC/LC/MS), ur confirm: NEGATIVE ng/mL (ref <25)
Clonazepam metabolite (GC/LC/MS), ur confirm: NEGATIVE ng/mL (ref <25)
Flurazepam metabolite (GC/LC/MS), ur confirm: NEGATIVE ng/mL (ref <50)
LORAZEPAMU: 741 ng/mL (ref <50)
MIDAZOLAMU: NEGATIVE ng/mL (ref <50)
Nordiazepam (GC/LC/MS), ur confirm: NEGATIVE ng/mL (ref <50)
Oxazepam (GC/LC/MS), ur confirm: NEGATIVE ng/mL (ref <50)
Temazepam (GC/LC/MS), ur confirm: NEGATIVE ng/mL (ref <50)
Triazolam metabolite (GC/LC/MS), ur confirm: NEGATIVE ng/mL (ref <50)

## 2014-08-04 LAB — PRESCRIPTION MONITORING PROFILE (SOLSTAS)
Amphetamine/Meth: NEGATIVE ng/mL
Barbiturate Screen, Urine: NEGATIVE ng/mL
Buprenorphine, Urine: NEGATIVE ng/mL
CANNABINOID SCRN UR: NEGATIVE ng/mL
COCAINE METABOLITES: NEGATIVE ng/mL
CREATININE, URINE: 25.46 mg/dL (ref >20.0)
Carisoprodol, Urine: NEGATIVE ng/mL
MDMA URINE: NEGATIVE ng/mL
Meperidine, Ur: NEGATIVE ng/mL
Methadone Screen, Urine: NEGATIVE ng/mL
Nitrites, Initial: NEGATIVE ug/mL
Oxycodone Screen, Ur: NEGATIVE ng/mL
Propoxyphene: NEGATIVE ng/mL
Tapentadol, urine: NEGATIVE ng/mL
Tramadol Scrn, Ur: NEGATIVE ng/mL
Zolpidem, Urine: NEGATIVE ng/mL
pH, Initial: 7.3 pH (ref 4.5–8.9)

## 2014-08-21 ENCOUNTER — Telehealth: Payer: Self-pay | Admitting: *Deleted

## 2014-08-21 NOTE — Telephone Encounter (Signed)
Scheduled for epidural on 08/25/14 @ 12:20 pm, wants to make sure [facility] will cover procedure before she goes ahead with appt

## 2014-08-25 ENCOUNTER — Ambulatory Visit (HOSPITAL_BASED_OUTPATIENT_CLINIC_OR_DEPARTMENT_OTHER): Payer: 59 | Admitting: Physical Medicine & Rehabilitation

## 2014-08-25 ENCOUNTER — Encounter: Payer: Self-pay | Admitting: Physical Medicine & Rehabilitation

## 2014-08-25 ENCOUNTER — Encounter: Payer: 59 | Attending: Physical Medicine and Rehabilitation

## 2014-08-25 DIAGNOSIS — M5416 Radiculopathy, lumbar region: Secondary | ICD-10-CM

## 2014-08-25 DIAGNOSIS — F0781 Postconcussional syndrome: Secondary | ICD-10-CM | POA: Insufficient documentation

## 2014-08-25 DIAGNOSIS — Z79899 Other long term (current) drug therapy: Secondary | ICD-10-CM | POA: Insufficient documentation

## 2014-08-25 DIAGNOSIS — M5136 Other intervertebral disc degeneration, lumbar region: Secondary | ICD-10-CM | POA: Insufficient documentation

## 2014-08-25 DIAGNOSIS — G894 Chronic pain syndrome: Secondary | ICD-10-CM | POA: Diagnosis present

## 2014-08-25 DIAGNOSIS — M4716 Other spondylosis with myelopathy, lumbar region: Secondary | ICD-10-CM | POA: Insufficient documentation

## 2014-08-25 DIAGNOSIS — G43519 Persistent migraine aura without cerebral infarction, intractable, without status migrainosus: Secondary | ICD-10-CM | POA: Diagnosis present

## 2014-08-25 DIAGNOSIS — M797 Fibromyalgia: Secondary | ICD-10-CM | POA: Diagnosis present

## 2014-08-25 DIAGNOSIS — Z5181 Encounter for therapeutic drug level monitoring: Secondary | ICD-10-CM | POA: Diagnosis present

## 2014-08-25 MED ORDER — HYDROCODONE-ACETAMINOPHEN 5-325 MG PO TABS: 1.0000 | ORAL_TABLET | Freq: Four times a day (QID) | ORAL | Status: DC | PRN

## 2014-08-25 MED ORDER — FENTANYL 25 MCG/HR TD PT72: 25.0000 ug | MEDICATED_PATCH | TRANSDERMAL | Status: DC

## 2014-08-25 NOTE — Patient Instructions (Signed)

## 2014-08-25 NOTE — Progress Notes (Signed)
  PROCEDURE RECORD [facility]   Name: Patty Salas DOB:[birth year] MRN: [id number]  Date:08/25/2014  Physician: Alysia Penna, MD    Nurse/CMA: Norinne Jeane   Allergies:  Allergies  Allergen Reactions  . Divalproex Sodium Anaphylaxis  . Hydrochlorothiazide Anaphylaxis    Pt loses facial movement uncontrolled;   . Imitrex [Sumatriptan Succinate] Anaphylaxis  . Latex   . Mellaril Anaphylaxis  . Olanzapine Anaphylaxis  . Penicillins   . Topamax Anaphylaxis and Other (See Comments)    Confusion, tremor, blurred vision  . Aripiprazole Other (See Comments)    Stiffened muscles  . Aspirin Hives  . Dalmane [Flurazepam Hcl] Other (See Comments)    Stiffened all muscles  . Darifenacin Hydrobromide Er Hives    Hives on back and looked like sunburn  . Metoclopramide Hcl Other (See Comments)    headache  . Seroquel [Quetiapine Fumerate] Other (See Comments)    extreme fatigue and bad dreams  . Statins Hives  . Stelazine Other (See Comments)    Stiffened muscles  . Thorazine [Chlorpromazine Hcl] Other (See Comments)    Stiffened muscles  . Abilify [Aripiprazole]   . Allopurinol   . Barium-Containing Compounds   . Colchicine   . Diflucan [Fluconazole]   . Duract [Bromfenac]   . Hyoscyamine   . Iohexol      Code: HIVES, Desc: pt broke out in red rash and hives after CT injection on 12/07/09.-pt needs 13 hr prep kit, Onset Date: [id number]   . Ivp Dye [Iodinated Diagnostic Agents]   . Lyrica [Pregabalin]   . Myrbetriq [Mirabegron]     swelling  . Nsaids   . Reglan [Metoclopramide]   . Renografin [Diatrizoate]   . Risperdal [Risperidone]   . Urocit - K [Potassium Citrate]   . Zyprexa [Olanzapine]   . Avelox [Moxifloxacin Hcl In Nacl] Nausea And Vomiting  . Diclofenac Rash  . Doxycycline Nausea Only  . E-Mycin [Erythromycin Base] Nausea Only  . Flagyl [Metronidazole Hcl] Nausea And Vomiting  . Lamictal [Lamotrigine] Rash  .  Pregabalin   . Risperidone Other (See Comments)    Too sedating    Consent Signed: Yes.    Is patient diabetic? No.  CBG today? .  Pregnant: No. LMP: No LMP recorded. Patient has had a hysterectomy. (age 50-55)  Anticoagulants: no Anti-inflammatory: no Antibiotics: no  Procedure: Translaminar ESI L4-5 Position: Prone Start Time:   12:51pm    End Time:  12:55pm Fluoro Time:  10  RN/CMA Mckinleigh Schuchart  Yaslene Lindamood     Time 12:36PM 1:02pm    BP 136/68 102/70    Pulse 78 86    Respirations 14 14    O2 Sat 96 97    S/S 6 6    Pain Level  8/10  3/10     D/C home with HUBAND, GIL, patient A & O X 3, D/C instructions reviewed, and sits independently.

## 2014-09-08 ENCOUNTER — Ambulatory Visit: Payer: 59 | Admitting: Neurology

## 2014-09-11 ENCOUNTER — Encounter: Payer: 59 | Attending: Physical Medicine and Rehabilitation | Admitting: Registered Nurse

## 2014-09-11 ENCOUNTER — Encounter: Payer: Self-pay | Admitting: Registered Nurse

## 2014-09-11 VITALS — BP 126/70 | HR 101 | Resp 14 | Wt 140.0 lb

## 2014-09-11 DIAGNOSIS — M4716 Other spondylosis with myelopathy, lumbar region: Secondary | ICD-10-CM | POA: Insufficient documentation

## 2014-09-11 DIAGNOSIS — Z5181 Encounter for therapeutic drug level monitoring: Secondary | ICD-10-CM | POA: Diagnosis present

## 2014-09-11 DIAGNOSIS — M5136 Other intervertebral disc degeneration, lumbar region: Secondary | ICD-10-CM | POA: Diagnosis present

## 2014-09-11 DIAGNOSIS — G894 Chronic pain syndrome: Secondary | ICD-10-CM | POA: Insufficient documentation

## 2014-09-11 DIAGNOSIS — F0781 Postconcussional syndrome: Secondary | ICD-10-CM | POA: Diagnosis present

## 2014-09-11 DIAGNOSIS — M797 Fibromyalgia: Secondary | ICD-10-CM | POA: Diagnosis present

## 2014-09-11 DIAGNOSIS — G43519 Persistent migraine aura without cerebral infarction, intractable, without status migrainosus: Secondary | ICD-10-CM | POA: Diagnosis present

## 2014-09-11 DIAGNOSIS — Z79899 Other long term (current) drug therapy: Secondary | ICD-10-CM | POA: Diagnosis present

## 2014-09-11 MED ORDER — HYDROCODONE-ACETAMINOPHEN 5-325 MG PO TABS: 1.0000 | ORAL_TABLET | Freq: Four times a day (QID) | ORAL | Status: DC | PRN

## 2014-09-11 NOTE — Progress Notes (Signed)
Subjective:    Patient ID: Patty Salas, female    DOB: 09-13-1956, 58 y.o.   MRN: [id number]  HPI: [name] is a 58 year old female who returns for follow up for chronic pain and medication refill. She says her pain is located in her lower back. She rates her pain 9.  Her current exercise regime she is performing stretching exercises and walking.  S/P ESI L-4- L-5 with good relief noted. Arrived to office tachycardia apical pulse checked 100. Pain Inventory Average Pain 8 Pain Right Now 9 My pain is constant, sharp and stabbing  In the last 24 hours, has pain interfered with the following? General activity 7 Relation with others 8 Enjoyment of life 7 What TIME of day is your pain at its worst? all Sleep (in general) Fair  Pain is worse with: walking, bending, sitting, inactivity and standing Pain improves with: rest, heat/ice, therapy/exercise, pacing activities, medication and injections Relief from Meds: 6  Mobility walk without assistance walk with assistance how many minutes can you walk? 5-10 ability to climb steps?  yes do you drive?  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  no  Physicians involved in your care Any changes since last visit?  no   Family History  Problem Relation Age of Onset  . Cancer Mother   . Migraines Mother   . Heart disease Father   . Hypertension Father   . Hypertension Brother   . Migraines Brother   . Hypertension Brother   . Migraines Brother   . Migraines Daughter    History   Social History  . Marital Status: Married    Spouse Name: N/A    Number of Children: 1  . Years of Education: COLLEGE1   Occupational History  . HOUSEWIFE     Social History Main Topics  . Smoking status: Never Smoker   . Smokeless tobacco: Never Used  . Alcohol Use: No  . Drug Use: No  . Sexual Activity: None   Other Topics Concern  . None   Social History Narrative   Past Surgical History  Procedure Laterality Date  . Neck surgery  2002  . Foot surgery      lt  . Cystoscopy    . Abdominal adhesion surgery    . Colonoscopy    . Cholecystectomy  1985  . Tonsillectomy  1976  . Appendectomy  1993  . Abdominal hysterectomy  1995  . Colonoscopy    . Lithotripsy    . Lymph node biopsy Right 03/06/2014    Procedure: right groin LYMPH NODE BIOPSY;  Surgeon: Merrie Roof, MD;  Location: [place];  Service: General;  Laterality: Right;   Past Medical History  Diagnosis Date  . Myalgia and myositis, unspecified   . Fibromyalgia   . Anxiety   . Depression   . Cervical facet syndrome   . Calcifying tendinitis of shoulder   . Carpal tunnel syndrome   . Thoracic radiculopathy   . Headache(784.0)   . Hypertension   . Bipolar affective   . Gout   . Asthma   . Renal calculi   . Diverticulosis   . PTSD (post-traumatic stress disorder)   . Full dentures   .  Wears glasses    BP 126/70 mmHg  Pulse 101  Resp 14  Wt 140 lb (63.504 kg)  SpO2 95%  Opioid Risk Score:   Fall Risk Score: Moderate Fall Risk (6-13 points) (previouslye educated and given handout)  Review of Systems  Constitutional: Positive for unexpected weight change.  Cardiovascular: Positive for leg swelling.  Gastrointestinal: Positive for nausea, vomiting, abdominal pain, diarrhea and constipation.       Bowel Control Problems  Genitourinary:       Bladder control problems  Musculoskeletal:       Spasms  Skin: Positive for rash.  Neurological: Positive for dizziness, weakness and numbness.  Psychiatric/Behavioral: Positive for confusion and dysphoric mood. The patient is nervous/anxious.   All other systems reviewed and are  negative.      Objective:   Physical Exam  Constitutional: She is oriented to person, place, and time. She appears well-developed and well-nourished.  HENT:  Head: Normocephalic and atraumatic.  Neck: Normal range of motion. Neck supple.  Cervical Paraspinal Tenderness: C-3- C-5  Cardiovascular: Normal rate and regular rhythm.   Pulmonary/Chest: Effort normal and breath sounds normal.  Musculoskeletal:  Normal Muscle Bulk and Muscle testing reveals: Upper Extremities: Full ROM and Muscle Strength 4/5 Thoracic and Lumbar Hypersensitivity Arises from chair with ease Narrow based gait  Neurological: She is alert and oriented to person, place, and time.  Skin: Skin is warm and dry.  Psychiatric: She has a normal mood and affect.  Nursing note and vitals reviewed.         Assessment & Plan:  1. History of fibromyalgia with myofascial pain and multiple trigger points. Continue with Heat and exercise Regime. Continue with gabapentin.  2. Chronic migraine headaches. No Complaints 3. Lumbar degenerative disk disease, L4-5 : S/P ESI with good relief noted. Refilled: Fentanyl Patch 25 mcg one patch every three days ( no script given) and Hydrocodone 5/325 mg one tablet every 6 hours as needed #90.  4. History of nephrolithiasis. Cysts on left kidney: Urology Following  5. Right CTS: No Complaints today. Wrist stabilizer intact.  30 minutes of face to face patient care time was spent during this visit. All questions were encouraged and answered.   F/U in 1 month

## 2014-10-09 ENCOUNTER — Encounter: Payer: 59 | Admitting: Registered Nurse

## 2014-10-13 ENCOUNTER — Encounter: Payer: 59 | Attending: Physical Medicine and Rehabilitation | Admitting: Registered Nurse

## 2014-10-13 ENCOUNTER — Encounter: Payer: Self-pay | Admitting: Registered Nurse

## 2014-10-13 VITALS — BP 139/73 | HR 112 | Resp 20

## 2014-10-13 DIAGNOSIS — F0781 Postconcussional syndrome: Secondary | ICD-10-CM | POA: Insufficient documentation

## 2014-10-13 DIAGNOSIS — M4716 Other spondylosis with myelopathy, lumbar region: Secondary | ICD-10-CM | POA: Diagnosis present

## 2014-10-13 DIAGNOSIS — G894 Chronic pain syndrome: Secondary | ICD-10-CM

## 2014-10-13 DIAGNOSIS — Z5181 Encounter for therapeutic drug level monitoring: Secondary | ICD-10-CM | POA: Diagnosis present

## 2014-10-13 DIAGNOSIS — M797 Fibromyalgia: Secondary | ICD-10-CM

## 2014-10-13 DIAGNOSIS — Z79899 Other long term (current) drug therapy: Secondary | ICD-10-CM | POA: Diagnosis present

## 2014-10-13 DIAGNOSIS — M5136 Other intervertebral disc degeneration, lumbar region: Secondary | ICD-10-CM | POA: Diagnosis present

## 2014-10-13 DIAGNOSIS — G43519 Persistent migraine aura without cerebral infarction, intractable, without status migrainosus: Secondary | ICD-10-CM | POA: Diagnosis present

## 2014-10-13 MED ORDER — HYDROCODONE-ACETAMINOPHEN 5-325 MG PO TABS: 1.0000 | ORAL_TABLET | Freq: Four times a day (QID) | ORAL | Status: DC | PRN

## 2014-10-13 MED ORDER — FENTANYL 25 MCG/HR TD PT72: 25.0000 ug | MEDICATED_PATCH | TRANSDERMAL | Status: DC

## 2014-10-13 NOTE — Progress Notes (Signed)
Subjective:    Patient ID: Patty Salas, female    DOB: June 12, 1956, 59 y.o.   MRN: [id number]  HPI: Patty Salas is a 59 year old female who returns for follow up for chronic pain and medication refill. She says her pain is located in her neck and lower back. She rates her pain 9. Her current exercise regime she is performing stretching   And zip hallow exercises and walking. Also using heat and ice therapy. Arrived to office tachycardia apical pulse checked 105. She states on 09/07/14 she had a migraine and was in the bathroom began vomiting and she believes she passed out and landed on her left side. She didn't seek medical attention. She stated she didn't want to make her husband angry, she wouldn't elaborate. She began to say her husband's having health issues as well. Educated on the importance of seeking medical attention if an emergency arises she verbalizes understanding. Also stated yesterday evening she had fever and chills with a temp of 102.4 she did take tylenol. She has an appointment with her PCP on 10/14/14.  Pain Inventory Average Pain 7 Pain Right Now 9 My pain is constant, sharp and stabbing  In the last 24 hours, has pain interfered with the following? General activity 8 Relation with others 9 Enjoyment of life 8 What TIME of day is your pain at its worst? all Sleep (in general) NA  Pain is worse with: walking, bending, sitting, inactivity and standing Pain improves with: rest, heat/ice, therapy/exercise, medication and injections Relief from Meds: 7  Mobility walk without assistance walk with assistance use a cane use a walker ability to climb steps?  yes do you drive?  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  no  Physicians involved in your care Any changes since last visit?  no   Family History  Problem Relation Age of Onset  . Cancer Mother   . Migraines Mother   . Heart disease Father   . Hypertension Father   . Hypertension Brother   . Migraines Brother   . Hypertension Brother   . Migraines Brother   . Migraines Daughter    History   Social History  . Marital Status: Married    Spouse Name: N/A    Number of Children: 1  . Years of Education: COLLEGE1   Occupational History  . HOUSEWIFE    Social History Main Topics  . Smoking status: Never Smoker   . Smokeless tobacco: Never Used  . Alcohol Use: No  . Drug Use: No  . Sexual Activity: None   Other Topics Concern  . None   Social History Narrative   Past Surgical History  Procedure Laterality Date  . Neck surgery  2002  . Foot surgery      lt  . Cystoscopy    . Abdominal adhesion surgery    . Colonoscopy    . Cholecystectomy  1985  . Tonsillectomy  1976  . Appendectomy  1993  . Abdominal hysterectomy  1995  . Colonoscopy    . Lithotripsy    . Lymph node biopsy Right 03/06/2014    Procedure: right groin LYMPH NODE BIOPSY;  Surgeon: Merrie Roof, MD;  Location:  [place];  Service: General;  Laterality: Right;   Past Medical History  Diagnosis Date  . Myalgia and myositis, unspecified   . Fibromyalgia   . Anxiety   . Depression   . Cervical facet syndrome   . Calcifying tendinitis of shoulder   . Carpal tunnel syndrome   . Thoracic radiculopathy   . Headache(784.0)   . Hypertension   . Bipolar affective   . Gout   . Asthma   . Renal calculi   . Diverticulosis   . PTSD (post-traumatic stress disorder)   . Full dentures   . Wears glasses    BP 139/73 P- 112 R 20 o2Sat 97% Opioid Risk Score:   Fall Risk Score:  high-educated previously Review of  Systems  Constitutional: Positive for fever, chills and unexpected weight change.  Cardiovascular: Positive for leg swelling.  Gastrointestinal: Positive for nausea, vomiting, abdominal pain, diarrhea and constipation.  Genitourinary:       Bladder control problems  Musculoskeletal:       Spasms  Skin: Positive for rash.  Neurological: Positive for dizziness, weakness and numbness.  Psychiatric/Behavioral: Positive for confusion and dysphoric mood. The patient is nervous/anxious.   All other systems reviewed and are negative.      Objective:   Physical Exam  Constitutional: She is oriented to person, place, and time. She appears well-developed and well-nourished.  HENT:  Head: Normocephalic and atraumatic.  Neck: Normal range of motion. Neck supple.  Cervical Paraspinal Tenderness: C-3- C-5  Cardiovascular: Normal rate and regular rhythm.   Pulmonary/Chest: Effort normal and breath sounds normal.  Musculoskeletal:  Normal Muscle Bulk and Muscle Testing Reveals: Upper extremities: Full ROM and Muscle strength 4/5 Left Spine of scapula Tenderness Lumbar Paraspinal tenderness: L-3- L-5  ( Mainly Left Side) Lower extremities: Full ROM and Muscle strength 5/5 Arises from chair with ease Narrow based gait    Neurological: She is alert and oriented to person, place, and time.  Skin: Skin is warm and dry.  Psychiatric: She has a normal mood and affect.  Nursing note and vitals reviewed.         Assessment & Plan:  1. History of fibromyalgia with myofascial pain and multiple trigger points. Continue with Heat and exercise Regime. Continue with gabapentin.  2. Chronic migraine headaches. No Complaints 3. Lumbar degenerative disk disease, L4-5 :  Refilled: Fentanyl Patch 25 mcg one patch every three days  #10 and Hydrocodone 5/325 mg one tablet every 6 hours as needed #90.  4. History of nephrolithiasis. Cysts on left kidney: Urology Following  5. Right CTS: No Complaints  today. Wrist stabilizer intact.  30 minutes of face to face patient care time was spent during this visit. All questions were encouraged and answered.   F/U in 1 month

## 2014-10-14 LAB — HEPATIC FUNCTION PANEL
ALT: 14 (ref 7–35)
AST: 27 (ref 13–35)
Alkaline Phosphatase: 58 (ref 25–125)
BILIRUBIN, TOTAL: 0.5

## 2014-10-14 LAB — BASIC METABOLIC PANEL
Potassium: 3.2 — AB (ref 3.4–5.3)
SODIUM: 139 (ref 137–147)

## 2014-10-14 LAB — LIPID PANEL
CHOLESTEROL: 232 — AB (ref 0–200)
HDL: 54 (ref 35–70)
LDL CALC: 154
TRIGLYCERIDES: 118 (ref 40–160)

## 2014-10-14 LAB — TSH: TSH: 1.28 (ref 0.41–5.90)

## 2014-11-09 ENCOUNTER — Encounter: Payer: 59 | Admitting: Physical Medicine & Rehabilitation

## 2014-11-11 ENCOUNTER — Telehealth: Payer: Self-pay | Admitting: *Deleted

## 2014-11-11 ENCOUNTER — Encounter: Payer: 59 | Admitting: Registered Nurse

## 2014-11-11 NOTE — Telephone Encounter (Signed)
Patient called asking if the December 01, 2014 CT should be delayed a week or two.  "I have a Bug or flu now and I think this may affect my lab results ordered before the test.  I'll need to have medication because I am allergic to the Barium and the Dye and I want to make sure my results are my results.  I have Kidney stones and I know the creatinine level is needed for this test."  Assessed the following symptoms: Fever = 101.8, diarrhea, vomiting.  Taking tylenol for fever.  Reports she was seen by Dr. Kenton Kingfisher with [facility] on 10-14-2014 and will get copies of labs to [facility].  Asked about symptoms, need to be seen and B.R.A.T. Diet.  Sarika reports history of "Irritable Bowels Syndrome, Diverticulitis, use of Hycosamine.  Following specific diet and knows when to call Dr. Earlean Shawl who she is currently scheduled for F/U on 11-17-2014.  Discussed diverticulitis and the importance of care to avoid complications or surgery.  Will notify Dr. Alvy Bimler of patient's concerns for upcoming scans and F/U.  Asked if she will cal to give update, if she feels poorly scans may need to be rescheduled.

## 2014-11-13 ENCOUNTER — Ambulatory Visit: Payer: 59 | Admitting: Registered Nurse

## 2014-11-13 ENCOUNTER — Telehealth: Payer: Self-pay | Admitting: *Deleted

## 2014-11-13 DIAGNOSIS — M4716 Other spondylosis with myelopathy, lumbar region: Secondary | ICD-10-CM

## 2014-11-13 DIAGNOSIS — M5136 Other intervertebral disc degeneration, lumbar region: Secondary | ICD-10-CM

## 2014-11-13 MED ORDER — FENTANYL 25 MCG/HR TD PT72: 25.0000 ug | MEDICATED_PATCH | TRANSDERMAL | Status: DC

## 2014-11-13 MED ORDER — HYDROCODONE-ACETAMINOPHEN 5-325 MG PO TABS: 1.0000 | ORAL_TABLET | Freq: Four times a day (QID) | ORAL | Status: DC | PRN

## 2014-11-13 NOTE — Telephone Encounter (Signed)
Patty Salas called and is sick and her husband will need to pick up her rx.  It has been ok'd for this one time . Rxs printed for Naaman Plummer to sign.

## 2014-11-16 NOTE — Telephone Encounter (Signed)
Scans are not until March 1st. I think she should have enough time to get better by then

## 2014-11-18 NOTE — Telephone Encounter (Signed)
error 

## 2014-12-01 ENCOUNTER — Telehealth: Payer: Self-pay | Admitting: *Deleted

## 2014-12-01 ENCOUNTER — Encounter (HOSPITAL_COMMUNITY): Payer: Self-pay

## 2014-12-01 ENCOUNTER — Ambulatory Visit (HOSPITAL_COMMUNITY)
Admission: RE | Admit: 2014-12-01 | Discharge: 2014-12-01 | Disposition: A | Discharge status: Home / Self Care | Payer: 59 | Source: Ambulatory Visit | Attending: Hematology and Oncology | Admitting: Hematology and Oncology

## 2014-12-01 ENCOUNTER — Other Ambulatory Visit (HOSPITAL_BASED_OUTPATIENT_CLINIC_OR_DEPARTMENT_OTHER): Payer: 59

## 2014-12-01 DIAGNOSIS — R591 Generalized enlarged lymph nodes: Secondary | ICD-10-CM | POA: Diagnosis not present

## 2014-12-01 DIAGNOSIS — R599 Enlarged lymph nodes, unspecified: Secondary | ICD-10-CM

## 2014-12-01 LAB — CBC WITH DIFFERENTIAL/PLATELET
BASO%: 0 % (ref 0.0–2.0)
Basophils Absolute: 0 10*3/uL (ref 0.0–0.1)
EOS%: 0 % (ref 0.0–7.0)
Eosinophils Absolute: 0 10*3/uL (ref 0.0–0.5)
HCT: 36.9 % (ref 34.8–46.6)
HEMOGLOBIN: 12.2 g/dL (ref 11.6–15.9)
LYMPH%: 7.4 % — ABNORMAL LOW (ref 14.0–49.7)
MCH: 30.4 pg (ref 25.1–34.0)
MCHC: 33.1 g/dL (ref 31.5–36.0)
MCV: 92 fL (ref 79.5–101.0)
MONO#: 0.1 10*3/uL (ref 0.1–0.9)
MONO%: 0.9 % (ref 0.0–14.0)
NEUT#: 5.8 10*3/uL (ref 1.5–6.5)
NEUT%: 91.7 % — AB (ref 38.4–76.8)
PLATELETS: 323 10*3/uL (ref 145–400)
RBC: 4.01 10*6/uL (ref 3.70–5.45)
RDW: 12.8 % (ref 11.2–14.5)
WBC: 6.4 10*3/uL (ref 3.9–10.3)
lymph#: 0.5 10*3/uL — ABNORMAL LOW (ref 0.9–3.3)

## 2014-12-01 LAB — COMPREHENSIVE METABOLIC PANEL (CC13)
ALT: 14 U/L (ref 0–55)
ANION GAP: 16 meq/L — AB (ref 3–11)
AST: 23 U/L (ref 5–34)
Albumin: 4.1 g/dL (ref 3.5–5.0)
Alkaline Phosphatase: 71 U/L (ref 40–150)
BUN: 14.3 mg/dL (ref 7.0–26.0)
CHLORIDE: 94 meq/L — AB (ref 98–109)
CO2: 30 mEq/L — ABNORMAL HIGH (ref 22–29)
Calcium: 10 mg/dL (ref 8.4–10.4)
Creatinine: 0.8 mg/dL (ref 0.6–1.1)
EGFR: 87 mL/min/{1.73_m2} — ABNORMAL LOW (ref >90)
Glucose: 143 mg/dl — ABNORMAL HIGH (ref 70–140)
POTASSIUM: 2.8 meq/L — AB (ref 3.5–5.1)
Sodium: 140 mEq/L (ref 136–145)
Total Bilirubin: 0.22 mg/dL (ref 0.20–1.20)
Total Protein: 8.4 g/dL — ABNORMAL HIGH (ref 6.4–8.3)

## 2014-12-01 MED ORDER — IOHEXOL 300 MG/ML  SOLN
100.0000 mL | Freq: Once | INTRAMUSCULAR | Status: AC | PRN
  Administered 2014-12-01: 100 mL via INTRAVENOUS

## 2014-12-01 NOTE — Telephone Encounter (Signed)
-----   Message from Heath Lark, MD sent at 12/01/2014 11:41 AM EST ----- Regarding: low K This is due to her Lasix. Is she taking potassium? If not, call in K Dur 20 mg BID X 7 days ----- Message -----    From: [facility] Interface    Sent: 12/01/2014  10:54 AM      To: Heath Lark, MD

## 2014-12-01 NOTE — Telephone Encounter (Signed)
Informed pt of low potassium level.  Pt states she has not taken potassium at home in over a week due to upset stomach, n/v/d and fevers.  She is feeling better now and will resume taking Potassium 20 Meq twice daily.  She does not need new rx as she has plenty at home.   Pt confirmed her appt here on Friday 3/4 to see Dr. Alvy Bimler.

## 2014-12-04 ENCOUNTER — Telehealth: Payer: Self-pay | Admitting: Hematology and Oncology

## 2014-12-04 ENCOUNTER — Ambulatory Visit: Payer: 59 | Admitting: Hematology and Oncology

## 2014-12-04 NOTE — Telephone Encounter (Signed)
returned pt call and cx appt per pt request....pt will call back to r/s

## 2014-12-07 ENCOUNTER — Telehealth: Payer: Self-pay | Admitting: Hematology and Oncology

## 2014-12-07 NOTE — Telephone Encounter (Signed)
pt called to r/s missed appt...done....pt aware of new d.t °

## 2014-12-09 ENCOUNTER — Encounter: Payer: Self-pay | Admitting: Physical Medicine & Rehabilitation

## 2014-12-09 ENCOUNTER — Encounter: Payer: 59 | Attending: Physical Medicine and Rehabilitation | Admitting: Physical Medicine & Rehabilitation

## 2014-12-09 VITALS — BP 136/86 | HR 116 | Resp 16

## 2014-12-09 DIAGNOSIS — M47812 Spondylosis without myelopathy or radiculopathy, cervical region: Secondary | ICD-10-CM

## 2014-12-09 DIAGNOSIS — G43519 Persistent migraine aura without cerebral infarction, intractable, without status migrainosus: Secondary | ICD-10-CM

## 2014-12-09 DIAGNOSIS — Z79899 Other long term (current) drug therapy: Secondary | ICD-10-CM | POA: Insufficient documentation

## 2014-12-09 DIAGNOSIS — M797 Fibromyalgia: Secondary | ICD-10-CM | POA: Insufficient documentation

## 2014-12-09 DIAGNOSIS — F0781 Postconcussional syndrome: Secondary | ICD-10-CM

## 2014-12-09 DIAGNOSIS — M47816 Spondylosis without myelopathy or radiculopathy, lumbar region: Secondary | ICD-10-CM

## 2014-12-09 DIAGNOSIS — G894 Chronic pain syndrome: Secondary | ICD-10-CM | POA: Diagnosis not present

## 2014-12-09 DIAGNOSIS — M4716 Other spondylosis with myelopathy, lumbar region: Secondary | ICD-10-CM | POA: Diagnosis present

## 2014-12-09 DIAGNOSIS — H8113 Benign paroxysmal vertigo, bilateral: Secondary | ICD-10-CM

## 2014-12-09 DIAGNOSIS — M5136 Other intervertebral disc degeneration, lumbar region: Secondary | ICD-10-CM | POA: Diagnosis present

## 2014-12-09 DIAGNOSIS — Z5181 Encounter for therapeutic drug level monitoring: Secondary | ICD-10-CM | POA: Insufficient documentation

## 2014-12-09 MED ORDER — FENTANYL 25 MCG/HR TD PT72: 25.0000 ug | MEDICATED_PATCH | TRANSDERMAL | Status: DC

## 2014-12-09 MED ORDER — HYDROCODONE-ACETAMINOPHEN 5-325 MG PO TABS: 1.0000 | ORAL_TABLET | Freq: Four times a day (QID) | ORAL | Status: DC | PRN

## 2014-12-09 NOTE — Progress Notes (Signed)
HPI  Patty Salas is back regarding her chronic pain and TPI's. Her pain levels have been increased a they pertain to her myosfacial pain. She seems to be doing fairly well with her husband. She has a good outlook on things.   She sees Dr. Alvy Bimler for dx/treatment of her lymphadenopathy. Recent CT of abdomen didn't show much change in pre-existing lymph nodes.   Patty Salas uses hydrocodone and fentanyl for pain control. She has been compliant.   Pain Inventory Average Pain 10 Pain Right Now 10 My pain is constant, sharp and stabbing  In the last 24 hours, has pain interfered with the following? General activity 10 Relation with others 10 Enjoyment of life 10 What TIME of day is your pain at its worst? morning, daytime, evening, night Sleep (in general) Fair  Pain is worse with: walking, bending, sitting and standing Pain improves with: rest, heat/ice and medication Relief from Meds: 2  Mobility walk without assistance use a cane use a walker ability to climb steps? yes do you drive? yes Do you have any goals in this area? 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 yes  Neuro/Psych bladder control problems bowel control problems weakness numbness tremor tingling spasms confusion depression anxiety  Prior Studies Any changes since last visit? yes  Physicians involved in your care Any changes since last visit? yes   Family History  Problem Relation Age of Onset  . Cancer Mother   . Migraines Mother   . Heart disease Father   . Hypertension Father   . Hypertension Brother   . Migraines Brother   . Hypertension Brother   . Migraines Brother   . Migraines Daughter    History   Social History  . Marital Status: Married    Spouse Name: N/A    Number of Children: 1  .  Years of Education: COLLEGE1   Occupational History  . HOUSEWIFE    Social History Main Topics  . Smoking status: Never Smoker   . Smokeless tobacco: Never Used  . Alcohol Use: No  . Drug Use: No  . Sexual Activity: None   Other Topics Concern  . None   Social History Narrative  . None   Past Surgical History  Procedure Laterality Date  . Neck surgery  2002  . Foot surgery      lt  . Cystoscopy    . Abdominal adhesion surgery    . Colonoscopy    . Cholecystectomy  1985  . Tonsillectomy  1976  . Appendectomy  1993  . Abdominal hysterectomy  1995  . Colonoscopy    . Lithotripsy    . Lymph node biopsy Right 03/06/2014    Procedure: right groin LYMPH NODE BIOPSY; Surgeon: Merrie Roof, MD; Location: [facility]; Service: General; Laterality: Right;   Past Medical History  Diagnosis Date  . Myalgia and myositis, unspecified   . Fibromyalgia   . Anxiety   . Depression   . Cervical facet syndrome   . Calcifying tendinitis of shoulder   . Carpal tunnel syndrome   . Thoracic radiculopathy   . Headache(784.0)   . Hypertension   . Bipolar affective   . Gout   . Asthma   . Renal calculi   . Diverticulosis   . PTSD (post-traumatic stress disorder)   . Full dentures   . Wears glasses  There were no vitals taken for this visit.  Opioid Risk Score:   Fall Risk Score: Moderate Fall Risk (6-13 points) Review of Systems     Objective:   Physical Exam  Constitutional: She is oriented to person, place, and time. She appears well-developed and well-nourished. She is wearing a foam cervical collar.  HENT:  Head: Normocephalic and atraumatic.  Eyes: Conjunctivae and EOM are normal. Pupils are equal, round, and reactive to light.  Neck: Neck supple.  Cardiovascular: Normal rate and regular  rhythm.  Pulmonary/Chest: Effort normal and breath sounds normal.  Abdominal: Soft. Bowel sounds are normal.  Musculoskeletal:  Posture rigid due to pain and cervical collar. Did not perform a myofascial exam. Sensation intact. EPB/APL tender. Has TP's in the SCM's, traps, and splenius capitis, and right lower lumbar spine. Limited flexion in lumbar spine due to pain.  Neurological: She is alert and oriented to person, place, and time. She has normal strength with some pain inhibition weakness in the RUE. Had difficulty tracking to the right and left---appeared to have nystagmus but closed her eyes to decreased the discomfort.  Gait pattern is stable. Balance improved  Psychiatric: Her speech is normal and behavior is normal. Judgment normal. She is very calm and collected today. In good spirits.  Assessment & Plan:   ASSESSMENT:  1. History of fibromyalgia with myofascial pain and multiple trigger points.  2. Chronic migraine headaches.  3. Lumbar degenerative disk disease, L4-5  4. History of nephrolithiasis. Cysts on left kidney  5. Right flexor pollicis longus tendonitis  6. Right CTS  7. DeQuervain's Tenosynovitis  8. Fall with concussion and PCS including increased headaches and BPPV--symptoms appear better.  9. Lymphadenopathy:    PLAN:  1.Lymphadenopathy work up per Dr. Alvy Bimler.  2. After informed consent and preparation of the skin with isopropyl alcohol, I injected the right and left lumbar paraspinals (x2), left upper and mid trap (x2). ---four total injections. The patient tolerated well, and no complications were experienced. Post-injection instructions were provided.  3. Continue with HEP  5. Consider botox for migraine headaches at some point in the near future.  6. I refilled her Norco 5/325 at #60 which she had taken a few more of because of pain. Also refilled duragesic patch  5. She will follow up with our NP in about one month. 30 minutes of face  to face patient care time were spent during this visit. All questions were encouraged and answered.

## 2014-12-09 NOTE — Patient Instructions (Signed)
PLEASE CALL ME WITH ANY PROBLEMS OR QUESTIONS (#[phone]).      

## 2015-01-05 ENCOUNTER — Ambulatory Visit (HOSPITAL_BASED_OUTPATIENT_CLINIC_OR_DEPARTMENT_OTHER): Payer: 59 | Admitting: Hematology and Oncology

## 2015-01-05 VITALS — BP 131/47 | HR 99 | Temp 98.5°F | Resp 18 | Ht 65.0 in | Wt 138.3 lb

## 2015-01-05 DIAGNOSIS — R591 Generalized enlarged lymph nodes: Secondary | ICD-10-CM | POA: Diagnosis not present

## 2015-01-05 DIAGNOSIS — R599 Enlarged lymph nodes, unspecified: Secondary | ICD-10-CM

## 2015-01-06 ENCOUNTER — Other Ambulatory Visit: Payer: Self-pay | Admitting: Physical Medicine & Rehabilitation

## 2015-01-06 ENCOUNTER — Telehealth: Payer: Self-pay | Admitting: Hematology and Oncology

## 2015-01-06 ENCOUNTER — Encounter: Payer: Self-pay | Admitting: Registered Nurse

## 2015-01-06 ENCOUNTER — Encounter: Payer: 59 | Attending: Physical Medicine and Rehabilitation | Admitting: Registered Nurse

## 2015-01-06 ENCOUNTER — Encounter: Payer: Self-pay | Admitting: Hematology and Oncology

## 2015-01-06 VITALS — BP 130/72 | HR 92 | Resp 16

## 2015-01-06 DIAGNOSIS — Z5181 Encounter for therapeutic drug level monitoring: Secondary | ICD-10-CM | POA: Diagnosis not present

## 2015-01-06 DIAGNOSIS — M47816 Spondylosis without myelopathy or radiculopathy, lumbar region: Secondary | ICD-10-CM

## 2015-01-06 DIAGNOSIS — Z79899 Other long term (current) drug therapy: Secondary | ICD-10-CM | POA: Diagnosis present

## 2015-01-06 DIAGNOSIS — M4716 Other spondylosis with myelopathy, lumbar region: Secondary | ICD-10-CM | POA: Diagnosis present

## 2015-01-06 DIAGNOSIS — G43519 Persistent migraine aura without cerebral infarction, intractable, without status migrainosus: Secondary | ICD-10-CM | POA: Insufficient documentation

## 2015-01-06 DIAGNOSIS — M797 Fibromyalgia: Secondary | ICD-10-CM | POA: Insufficient documentation

## 2015-01-06 DIAGNOSIS — M5136 Other intervertebral disc degeneration, lumbar region: Secondary | ICD-10-CM

## 2015-01-06 DIAGNOSIS — M47812 Spondylosis without myelopathy or radiculopathy, cervical region: Secondary | ICD-10-CM

## 2015-01-06 DIAGNOSIS — G894 Chronic pain syndrome: Secondary | ICD-10-CM | POA: Insufficient documentation

## 2015-01-06 DIAGNOSIS — F0781 Postconcussional syndrome: Secondary | ICD-10-CM | POA: Diagnosis present

## 2015-01-06 MED ORDER — FENTANYL 25 MCG/HR TD PT72: 25.0000 ug | MEDICATED_PATCH | TRANSDERMAL | Status: DC

## 2015-01-06 MED ORDER — HYDROCODONE-ACETAMINOPHEN 5-325 MG PO TABS: 1.0000 | ORAL_TABLET | Freq: Four times a day (QID) | ORAL | Status: DC | PRN

## 2015-01-06 NOTE — Progress Notes (Signed)
[facility] OFFICE PROGRESS NOTE  [name], MD SUMMARY OF HEMATOLOGIC HISTORY:  The patient is seen  here because of nonspecific lymphadenopathy detected on recent CT scan. This patient have chronic kidney stone disease. On 01/01/2014, she had CT scan without contrast for evaluation of kidney stones and was found to have multiple periaortic lymphadenopathy. The patient denies any recent infection. She does have history of diverticulosis and she thought she may have a bout of diverticulitis recently. PET CT scan dated 01/26/2014 show diffuse lymphadenopathy. On 03/06/2014, excisional lymph node biopsy is benign  INTERVAL HISTORY: Patty Salas 59 y.o. female returns for further follow-up. She has some nonspecific complaints such as low-grade fever, occasional chills, intermittent change in bowel habits, severe migraine headaches with occasional nausea.  Denies new lymphadenopathy. I have reviewed the past medical history, past surgical history, social history and family history with the patient and they are unchanged from previous note.  ALLERGIES:  is allergic to divalproex sodium; hydrochlorothiazide; imitrex; latex; mellaril; olanzapine; penicillins; topamax; aripiprazole; aspirin; dalmane; darifenacin hydrobromide er; metoclopramide hcl; seroquel; statins; stelazine; thorazine; abilify; allopurinol; barium-containing compounds; colchicine; diflucan; duract; iohexol; ivp dye; lyrica; myrbetriq; nsaids; reglan; renografin; risperdal; urocit - k; zyprexa; avelox; diclofenac; doxycycline; e-mycin; flagyl; lamictal; pregabalin; and risperidone.  MEDICATIONS:  Current Outpatient Prescriptions  Medication Sig Dispense Refill  . almotriptan (AXERT) 12.5 MG tablet Take 1 tablet (12.5 mg total) by mouth as needed for migraine (Max 2 tabs per week). 24 tablet 1  . bisacodyl (DULCOLAX) 5 MG EC tablet Take 10 mg by mouth daily as needed.    . cyclobenzaprine (FLEXERIL) 10 MG  tablet Take 10 mg by mouth 3 (three) times daily.    Marland Kitchen docusate sodium (COLACE) 100 MG capsule Take 100 mg by mouth daily as needed.    Marland Kitchen EPIPEN 2-PAK 0.3 MG/0.3ML SOAJ injection   0  . estradiol (VIVELLE-DOT) 0.1 MG/24HR Place 1 patch onto the skin 2 (two) times a week.    . fentaNYL (DURAGESIC - DOSED MCG/HR) 25 MCG/HR patch Place 1 patch (25 mcg total) onto the skin every 3 (three) days. 10 patch 0  . furosemide (LASIX) 40 MG tablet Take 40-80 mg by mouth daily as needed.    . gabapentin (NEURONTIN) 300 MG capsule Take 600 mg by mouth at bedtime.    Marland Kitchen HYDROcodone-acetaminophen (NORCO/VICODIN) 5-325 MG per tablet Take 1 tablet by mouth every 6 (six) hours as needed for moderate pain. 90 tablet 0  . hyoscyamine (LEVSIN, ANASPAZ) 0.125 MG tablet Take 0.125 mg by mouth every 4 (four) hours as needed.    Marland Kitchen LORazepam (ATIVAN) 1 MG tablet Take 1 mg by mouth 3 (three) times daily.    . potassium chloride SA (K-DUR,KLOR-CON) 20 MEQ tablet Take 20 mEq by mouth 2 (two) times daily.    Marland Kitchen PROAIR HFA 108 (90 BASE) MCG/ACT inhaler Inhale 2 puffs into the lungs as directed.    . promethazine (PHENERGAN) 25 MG tablet every 6 (six) hours as needed.     . Trospium Chloride 60 MG CP24 Take 1 capsule by mouth Daily.    . Vitamin D, Ergocalciferol, (DRISDOL) 50000 UNITS CAPS capsule Take 50,000 Units by mouth every 7 (seven) days.     Marland Kitchen zolmitriptan (ZOMIG-ZMT) 5 MG disintegrating tablet Take 1 tablet (5 mg total) by mouth as needed for migraine (May use up to 2 tabs per week). 27 tablet 1   No current facility-administered medications for this visit.  REVIEW OF SYSTEMS:   Eyes: Denies blurriness of vision Ears, nose, mouth, throat, and face: Denies mucositis or sore throat Respiratory: Denies cough, dyspnea or wheezes Cardiovascular: Denies palpitation, chest discomfort or lower extremity swelling Skin: Denies abnormal skin rashes Lymphatics: Denies new lymphadenopathy or easy  bruising Neurological:Denies numbness, tingling or new weaknesses Behavioral/Psych: Mood is stable, no new changes  All other systems were reviewed with the patient and are negative.  PHYSICAL EXAMINATION: ECOG PERFORMANCE STATUS: 1 - Symptomatic but completely ambulatory  Filed Vitals:   01/05/15 1502  BP: 131/47  Pulse: 99  Temp: 98.5 F (36.9 C)  Resp: 18   Filed Weights   01/05/15 1502  Weight: 138 lb 4.8 oz (62.732 kg)    GENERAL:alert, no distress and comfortable SKIN: skin color, texture, turgor are normal, no rashes or significant lesions EYES: normal, Conjunctiva are pink and non-injected, sclera clear NEURO: alert & oriented x 3 with fluent speech, no focal motor/sensory deficits  LABORATORY DATA:  I have reviewed the data as listed No results found for this or any previous visit (from the past 48 hour(s)).  Lab Results  Component Value Date   WBC 6.4 12/01/2014   HGB 12.2 12/01/2014   HCT 36.9 12/01/2014   MCV 92.0 12/01/2014   PLT 323 12/01/2014    RADIOGRAPHIC STUDIES: I reviewed her most recent CT scan from March 2016 I have personally reviewed the radiological images as listed and agreed with the findings in the report.  ASSESSMENT & PLAN:  Enlarged lymph node I reviewed the imaging study with the patient. Overall, low-grade lymphoma cannot be excluded. The patient could also have reactive lymphadenopathy of unknown reason. Bottom-line is, she is not symptomatic. I recommend yearly observation with history, physical examination and blood work and to different imaging study to the future       All questions were answered. The patient knows to call the clinic with any problems, questions or concerns. No barriers to learning was detected.  I spent 15 minutes counseling the patient face to face. The total time spent in the appointment was 20 minutes and more than 50% was on counseling.     [facility], Aubrii Sharpless, MD 4/6/201610:09 AM

## 2015-01-06 NOTE — Telephone Encounter (Signed)
spoke with patient and advised on April 2017.Marland KitchenMarland KitchenMarland Kitchenpt ok and aware

## 2015-01-06 NOTE — Progress Notes (Signed)
Subjective:    Patient ID: Patty Salas, female    DOB: [birth year], 59 y.o.   MRN: [id number]  HPI: [name] is a 59 year old female who returns for follow up for chronic pain and medication refill. She says her pain is located in her neck, bilateral shoulder blades,mid- lower back and right foot . She rates her pain 9. Her current exercise regime she is performing stretching exercises,and zip hallow exercises and walking. Also using heat and ice therapy. Also states she has been experiencing right foot pain over a week, she was diagnosed with Gout in the past.Encouraged to use ice therapy she refuses prednisone at this time.  Pain Inventory Average Pain 8 Pain Right Now 9 My pain is constant, sharp, burning and stabbing  In the last 24 hours, has pain interfered with the following? General activity 9 Relation with others 10 Enjoyment of life 9 What TIME of day is your pain at its worst? ALL Sleep (in general) Fair  Pain is worse with: walking, bending, sitting, standing and some activites Pain improves with: rest, medication and injections Relief from Meds: 5  Mobility use a cane how many minutes can you walk? 2-3 ability to climb steps?  yes do you drive?  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  no  Physicians involved in your care Any changes since last visit?  no   Family History  Problem Relation Age of Onset  . Cancer Mother   . Migraines Mother   . Heart disease Father   . Hypertension Father   . Hypertension Brother   . Migraines Brother   . Hypertension Brother   . Migraines Brother   . Migraines Daughter    History   Social History  . Marital Status: Married    Spouse Name: N/A  . Number of Children: 1  . Years of  Education: COLLEGE1   Occupational History  . HOUSEWIFE    Social History Main Topics  . Smoking status: Never Smoker   . Smokeless tobacco: Never Used  . Alcohol Use: No  . Drug Use: No  . Sexual Activity: Not on file   Other Topics Concern  . None   Social History Narrative   Past Surgical History  Procedure Laterality Date  . Neck surgery  2002  . Foot surgery      lt  . Cystoscopy    . Abdominal adhesion surgery    . Colonoscopy    . Cholecystectomy  1985  . Tonsillectomy  1976  . Appendectomy  1993  . Abdominal hysterectomy  1995  . Colonoscopy    . Lithotripsy    . Lymph node biopsy Right 03/06/2014    Procedure: right groin LYMPH NODE BIOPSY;  Surgeon: Merrie Roof, MD;  Location: [facility];  Service: General;  Laterality: Right;   Past Medical History  Diagnosis Date  . Myalgia and myositis, unspecified   . Fibromyalgia   . Anxiety   . Depression   . Cervical facet syndrome   . Calcifying tendinitis of shoulder   . Carpal tunnel syndrome   . Thoracic radiculopathy   . Headache(784.0)   . Hypertension   . Bipolar affective   . Gout   . Asthma   . Renal calculi   . Diverticulosis   .  PTSD (post-traumatic stress disorder)   . Full dentures   . Wears glasses    BP 130/72 mmHg  Pulse 92  Resp 16  SpO2 98%  Opioid Risk Score:   Fall Risk Score: Moderate Fall Risk (6-13 points)`1  Depression screen PHQ 2/9  No flowsheet data found.   Review of Systems  Constitutional: Positive for fever.       Weight gain/loss, night sweat  Eyes: Negative.   Respiratory: Negative.   Cardiovascular: Positive for leg swelling.  Gastrointestinal: Positive for nausea, abdominal pain, diarrhea and constipation.       Bowel control problems  Endocrine: Negative.   Genitourinary: Positive for dysuria.  Musculoskeletal: Positive for myalgias, back pain, arthralgias and neck pain.  Skin: Negative.   Allergic/Immunologic: Negative.     Neurological: Positive for dizziness, weakness, numbness and headaches.       Trouble walking, spasms  Hematological: Negative.   Psychiatric/Behavioral: Positive for confusion and dysphoric mood. The patient is nervous/anxious.        Objective:   Physical Exam  Constitutional: She is oriented to person, place, and time. She appears well-developed and well-nourished.  HENT:  Head: Normocephalic and atraumatic.  Neck: Normal range of motion. Neck supple.  Cardiovascular: Normal rate and regular rhythm.   Pulmonary/Chest: Effort normal and breath sounds normal.  Musculoskeletal:  Normal Muscle bulk and Muscle Testing Reveals: Upper Extremities: Full ROM and Muscle strength 5/5 Thoracic Paraspinal Tenderness: T-3 _ T-7 Mainly Left Side Lumbar Paraspinal Tenderness: L-3- L-5 Lower Extremities: Full ROM and Muscle Strength 5/5 Right Metatarsal soft tissue reddened, warm and tenderness noted with palpation Arises from chair with ease   Neurological: She is alert and oriented to person, place, and time.  Skin: Skin is warm and dry.  Psychiatric: She has a normal mood and affect.  Nursing note and vitals reviewed.         Assessment & Plan:  1. History of fibromyalgia with myofascial pain and multiple trigger points. Continue with Heat and exercise Regime. Continue with gabapentin.  2. Chronic migraine headaches. No Complaints 3. Lumbar degenerative disk disease, L4-5 :  Refilled: Fentanyl Patch 25 mcg one patch every three days #10 and Hydrocodone 5/325 mg one tablet every 6 hours as needed #90.  4. History of nephrolithiasis. Cysts on left kidney: Urology Following  5. Right CTS: No Complaints today. Wrist stabilizer intact. 6. Gout?: Refused Prednisone. Continue with Ice Therapy 30 minutes of face to face patient care time was spent during this visit. All questions were encouraged and answered.   F/U in 1 month

## 2015-01-06 NOTE — Assessment & Plan Note (Signed)
I reviewed the imaging study with the patient. Overall, low-grade lymphoma cannot be excluded. The patient could also have reactive lymphadenopathy of unknown reason. Bottom-line is, she is not symptomatic. I recommend yearly observation with history, physical examination and blood work and to different imaging study to the future

## 2015-01-07 LAB — PMP ALCOHOL METABOLITE (ETG): Ethyl Glucuronide (EtG): NEGATIVE ng/mL

## 2015-01-11 LAB — FENTANYL (GC/LC/MS), URINE
Fentanyl, confirm: 10.9 ng/mL (ref <0.5)
NORFENTANYL (GC/MS) CONFIRM: 262.1 ng/mL (ref <0.5)

## 2015-01-11 LAB — OPIATES/OPIOIDS (LC/MS-MS)
Codeine Urine: NEGATIVE ng/mL (ref <50)
Hydrocodone: 831 ng/mL (ref <50)
Hydromorphone: 863 ng/mL (ref <50)
Morphine Urine: NEGATIVE ng/mL (ref <50)
NORHYDROCODONE, UR: 2035 ng/mL (ref <50)
Noroxycodone, Ur: NEGATIVE ng/mL (ref <50)
OXYMORPHONE, URINE: NEGATIVE ng/mL (ref <50)
Oxycodone, ur: NEGATIVE ng/mL (ref <50)

## 2015-01-11 LAB — BENZODIAZEPINES (GC/LC/MS), URINE
Alprazolam metabolite (GC/LC/MS), ur confirm: NEGATIVE ng/mL (ref <25)
CLONAZEPAU: NEGATIVE ng/mL (ref <25)
Flurazepam metabolite (GC/LC/MS), ur confirm: NEGATIVE ng/mL (ref <50)
Lorazepam (GC/LC/MS), ur confirm: 4180 ng/mL (ref <50)
Midazolam (GC/LC/MS), ur confirm: NEGATIVE ng/mL (ref <50)
NORDIAZEPAMU: NEGATIVE ng/mL (ref <50)
OXAZEPAMU: NEGATIVE ng/mL (ref <50)
TRIAZOLAMU: NEGATIVE ng/mL (ref <50)
Temazepam (GC/LC/MS), ur confirm: NEGATIVE ng/mL (ref <50)

## 2015-01-12 ENCOUNTER — Telehealth: Payer: Self-pay | Admitting: *Deleted

## 2015-01-12 DIAGNOSIS — M4716 Other spondylosis with myelopathy, lumbar region: Secondary | ICD-10-CM

## 2015-01-12 DIAGNOSIS — M5136 Other intervertebral disc degeneration, lumbar region: Secondary | ICD-10-CM

## 2015-01-12 LAB — PRESCRIPTION MONITORING PROFILE (SOLSTAS)
Amphetamine/Meth: NEGATIVE ng/mL
BARBITURATE SCREEN, URINE: NEGATIVE ng/mL
Buprenorphine, Urine: NEGATIVE ng/mL
CANNABINOID SCRN UR: NEGATIVE ng/mL
COCAINE METABOLITES: NEGATIVE ng/mL
Carisoprodol, Urine: NEGATIVE ng/mL
Creatinine, Urine: 113.11 mg/dL (ref >20.0)
MDMA URINE: NEGATIVE ng/mL
MEPERIDINE UR: NEGATIVE ng/mL
Methadone Screen, Urine: NEGATIVE ng/mL
Nitrites, Initial: NEGATIVE ug/mL
Oxycodone Screen, Ur: NEGATIVE ng/mL
Propoxyphene: NEGATIVE ng/mL
TAPENTADOLUR: NEGATIVE ng/mL
Tramadol Scrn, Ur: NEGATIVE ng/mL
ZOLPIDEM, URINE: NEGATIVE ng/mL
pH, Initial: 6.8 pH (ref 4.5–8.9)

## 2015-01-12 MED ORDER — HYDROCODONE-ACETAMINOPHEN 5-325 MG PO TABS: 1.0000 | ORAL_TABLET | Freq: Four times a day (QID) | ORAL | Status: DC | PRN

## 2015-01-12 MED ORDER — FENTANYL 25 MCG/HR TD PT72: 25.0000 ug | MEDICATED_PATCH | TRANSDERMAL | Status: DC

## 2015-01-12 NOTE — Telephone Encounter (Signed)
Patty Salas called for Patty Salas to let her know they would be leaving for the beach Wed 13th or Thur 14th.  Will need her Rxs.  Printed for [employer] to sign

## 2015-01-12 NOTE — Telephone Encounter (Signed)
I clarified with Christie and she does not need a new rx--she has them, she was just needing ok to fill early.  She says her pharmacy will not fill early.  I told her to take with her and get [facility].  I will destroy the Rxs printed today (01/12/15)

## 2015-01-14 ENCOUNTER — Telehealth: Payer: Self-pay | Admitting: *Deleted

## 2015-01-14 NOTE — Telephone Encounter (Signed)
Patty Salas has called and she thinks her problem with her foot is gout.  She tried to get in with her PCP and they could not see her.  She is wondering if a mdrol dosepack could be called in for her.

## 2015-01-15 MED ORDER — METHYLPREDNISOLONE 4 MG PO TBPK: ORAL_TABLET | ORAL | Status: DC

## 2015-01-15 NOTE — Telephone Encounter (Signed)
Aurore called back. She tried calling her PCP, as [name] suggested, but they cannot work her in until next month. She has tried ice and elevation with no success. She is trying to leave to go out of town and would really appreciate a dose pack being called in since she cannot get in to see her PCP. If we can give the RX, [facility] [address]. Call her cell number if there are questions.

## 2015-01-15 NOTE — Telephone Encounter (Signed)
She may have a medrol dose pack

## 2015-01-15 NOTE — Telephone Encounter (Signed)
Ordered and Patty Salas notified

## 2015-01-19 ENCOUNTER — Other Ambulatory Visit: Payer: Self-pay | Admitting: Physical Medicine & Rehabilitation

## 2015-01-20 NOTE — Telephone Encounter (Signed)
Patty Salas has called again about getting a second dosepak because she felt it was doing some good--still can't get a shoe on her foot though because of pain.  Do you want to refill the dosepak?

## 2015-01-20 NOTE — Progress Notes (Signed)
Urine drug screen for this encounter is consistent for prescribed medication 

## 2015-01-21 ENCOUNTER — Encounter: Payer: Self-pay | Admitting: Neurology

## 2015-01-21 ENCOUNTER — Telehealth: Payer: Self-pay

## 2015-01-21 ENCOUNTER — Ambulatory Visit (INDEPENDENT_AMBULATORY_CARE_PROVIDER_SITE_OTHER): Payer: 59 | Admitting: Neurology

## 2015-01-21 VITALS — BP 129/76 | HR 103 | Ht 64.0 in | Wt 136.8 lb

## 2015-01-21 DIAGNOSIS — G43519 Persistent migraine aura without cerebral infarction, intractable, without status migrainosus: Secondary | ICD-10-CM | POA: Diagnosis not present

## 2015-01-21 MED ORDER — ALMOTRIPTAN MALATE 12.5 MG PO TABS: 12.5000 mg | ORAL_TABLET | ORAL | Status: DC | PRN

## 2015-01-21 NOTE — Patient Instructions (Signed)

## 2015-01-21 NOTE — Progress Notes (Signed)
Reason for visit: Migraine headache  Patty Salas is an 59 y.o. female  History of present illness:  Patty Salas is a 59 year old right-handed white female with a history of migraine headaches. The patient has nonorganic features to the clinical examination, with left-sided numbness and weakness. The patient is on Axert that she takes for migraines, she indicates that she gets headaches up to twice a week. The patient is able to use Phenergan sometimes to alleviate the headache, and other times she needs take the Axert. The patient is followed through a pain center and she is on daily opiate medications. She has some gait instability, she uses a cane for ambulation. She fell in December 2014 and December 2015. The patient has undergone epidural injections of the lumbosacral spine previously. The patient returns to this office for further evaluation.  Past Medical History  Diagnosis Date  . Myalgia and myositis, unspecified   . Fibromyalgia   . Anxiety   . Depression   . Cervical facet syndrome   . Calcifying tendinitis of shoulder   . Carpal tunnel syndrome   . Thoracic radiculopathy   . Headache(784.0)   . Hypertension   . Bipolar affective   . Gout   . Asthma   . Renal calculi   . Diverticulosis   . PTSD (post-traumatic stress disorder)   . Full dentures   . Wears glasses   . Falls     Past Surgical History  Procedure Laterality Date  . Neck surgery  2002  . Foot surgery      lt  . Cystoscopy    . Abdominal adhesion surgery    . Colonoscopy    . Cholecystectomy  1985  . Tonsillectomy  1976  . Appendectomy  1993  . Abdominal hysterectomy  1995  . Colonoscopy    . Lithotripsy    . Lymph node biopsy Right 03/06/2014    Procedure: right groin LYMPH NODE BIOPSY;  Surgeon: Merrie Roof, MD;  Location: [facility];  Service: General;  Laterality: Right;    Family History  Problem Relation Age of Onset  . Cancer Mother   . Migraines Mother   . Heart  disease Father   . Hypertension Father   . Cancer Father     colon  . Hypertension Brother   . Migraines Brother   . Hypertension Brother   . Migraines Brother   . Migraines Daughter     Social history:  reports that she has never smoked. She has never used smokeless tobacco. She reports that she does not drink alcohol or use illicit drugs.    Allergies  Allergen Reactions  . Divalproex Sodium Anaphylaxis  . Hydrochlorothiazide Anaphylaxis    Pt loses facial movement uncontrolled;   . Imitrex [Sumatriptan Succinate] Anaphylaxis  . Latex   . Mellaril Anaphylaxis  . Olanzapine Anaphylaxis  . Penicillins   . Topamax Anaphylaxis and Other (See Comments)    Confusion, tremor, blurred vision  . Aripiprazole Other (See Comments)    Stiffened muscles  . Aspirin Hives  . Dalmane [Flurazepam Hcl] Other (See Comments)    Stiffened all muscles  . Darifenacin Hydrobromide Er Hives    Hives on back and looked like sunburn  . Metoclopramide Hcl Other (See Comments)    headache  . Seroquel [Quetiapine Fumerate] Other (See Comments)    extreme fatigue and bad dreams  . Statins Hives  . Stelazine Other (See Comments)    Stiffened  muscles  . Thorazine [Chlorpromazine Hcl] Other (See Comments)    Stiffened muscles  . Abilify [Aripiprazole]   . Allopurinol   . Barium-Containing Compounds   . Colchicine   . Diflucan [Fluconazole]   . Duract [Bromfenac]   . Iohexol      Code: HIVES, Desc: pt broke out in red rash and hives after CT injection on 12/07/09.-pt needs 13 hr prep kit, Onset Date: [id number]   . Ivp Dye [Iodinated Diagnostic Agents]   . Lyrica [Pregabalin]   . Myrbetriq [Mirabegron]     swelling  . Nsaids   . Reglan [Metoclopramide]   . Renografin [Diatrizoate]   . Risperdal [Risperidone]   . Urocit - K [Potassium Citrate]   . Zyprexa [Olanzapine]   . Avelox [Moxifloxacin Hcl In Nacl] Nausea And Vomiting  . Diclofenac Rash  . Doxycycline Nausea Only  . E-Mycin  [Erythromycin Base] Nausea Only  . Flagyl [Metronidazole Hcl] Nausea And Vomiting  . Lamictal [Lamotrigine] Rash  . Pregabalin   . Risperidone Other (See Comments)    Too sedating    Medications:  Prior to Admission medications   Medication Sig Start Date End Date Taking? Authorizing Provider  almotriptan (AXERT) 12.5 MG tablet Take 1 tablet (12.5 mg total) by mouth as needed for migraine (Max 2 tabs per week). 01/21/15  Yes Kathrynn Ducking, MD  bisacodyl (DULCOLAX) 5 MG EC tablet Take 10 mg by mouth daily as needed.   Yes Historical Provider, MD  cyclobenzaprine (FLEXERIL) 10 MG tablet Take 10 mg by mouth 3 (three) times daily.   Yes Historical Provider, MD  docusate sodium (COLACE) 100 MG capsule Take 100 mg by mouth daily as needed.   Yes Historical Provider, MD  EPIPEN 2-PAK 0.3 MG/0.3ML SOAJ injection  07/22/14  Yes Historical Provider, MD  estradiol (VIVELLE-DOT) 0.1 MG/24HR Place 1 patch onto the skin 2 (two) times a week.   Yes Historical Provider, MD  fentaNYL (DURAGESIC - DOSED MCG/HR) 25 MCG/HR patch Place 1 patch (25 mcg total) onto the skin every 3 (three) days. 01/12/15  Yes Bayard Hugger, NP  furosemide (LASIX) 40 MG tablet Take 40-80 mg by mouth daily as needed.   Yes Historical Provider, MD  gabapentin (NEURONTIN) 300 MG capsule Take 600 mg by mouth at bedtime.   Yes Ricard Dillon, MD  HYDROcodone-acetaminophen (NORCO/VICODIN) 5-325 MG per tablet Take 1 tablet by mouth every 6 (six) hours as needed for moderate pain. 01/12/15  Yes Bayard Hugger, NP  hyoscyamine (LEVSIN, ANASPAZ) 0.125 MG tablet Take 0.125 mg by mouth every 4 (four) hours as needed.   Yes Historical Provider, MD  LORazepam (ATIVAN) 1 MG tablet Take 1 mg by mouth 3 (three) times daily.   Yes Historical Provider, MD  methylPREDNISolone (MEDROL DOSEPAK) 4 MG TBPK tablet TAKE 6 TABLETS ON DAY 1 AS DIRECTED ON PACKAGE AND DECREASE BY 1 TAB EACH DAY FOR A TOTAL OF 6 DAYS 01/20/15  Yes Meredith Staggers, MD    potassium chloride SA (K-DUR,KLOR-CON) 20 MEQ tablet Take 20 mEq by mouth 2 (two) times daily.   Yes Historical Provider, MD  PROAIR HFA 108 (90 BASE) MCG/ACT inhaler Inhale 2 puffs into the lungs as directed. 05/29/12  Yes Historical Provider, MD  promethazine (PHENERGAN) 25 MG tablet every 6 (six) hours as needed.  08/22/12  Yes Historical Provider, MD  Trospium Chloride 60 MG CP24 Take 1 capsule by mouth Daily. 03/28/12  Yes Festus Aloe, MD  Vitamin D,  Ergocalciferol, (DRISDOL) 50000 UNITS CAPS capsule Take 50,000 Units by mouth every 7 (seven) days.  11/17/13  Yes Historical Provider, MD  zolmitriptan (ZOMIG-ZMT) 5 MG disintegrating tablet Take 1 tablet (5 mg total) by mouth as needed for migraine (May use up to 2 tabs per week). 03/29/14  Yes Kathrynn Ducking, MD    ROS:  Out of a complete 14 system review of symptoms, the patient complains only of the following symptoms, and all other reviewed systems are negative.  Decreased activity, chills, fatigue, fever Neck pain, neck stiffness Drooling Light sensitivity, dry eyes Asthma Abdominal pain, constipation, diarrhea, nausea, vomiting, irritable bowel Restless legs, frequent waking, daytime sleepiness, snoring, sleep talking Environmental allergies, food allergies Frequency of urination, urinary urgency Joint pain, joint swelling, back pain, achy muscles, muscle cramps, walking difficulty, coordination problems Swollen lymph nodes Dizziness, headache, numbness, weakness Confusion, decreased concentration, depression  Blood pressure 129/76, pulse 103, height _0  (1.626 m), weight 136 lb 12.8 oz (62.052 kg).  Physical Exam  General: The patient is alert and cooperative at the time of the examination.  Skin: No significant peripheral edema is noted.   Neurologic Exam  Mental status: The patient is alert and oriented x 3 at the time of the examination. The patient has apparent normal recent and remote memory, with an  apparently normal attention span and concentration ability.   Cranial nerves: Facial symmetry is present. Speech is normal, no aphasia or dysarthria is noted. Extraocular movements are full. Visual fields are full.  Motor: The patient has good strength in all 4 extremities.  Sensory examination: Soft touch sensation is decreased on the left face, arm, and leg. The patient has decreased vibration sensation on the left face, arm, and leg. The patient's post midline with vibration sensation on the forehead..  Coordination: The patient has good finger-nose-finger and heel-to-shin bilaterally.  Gait and station: The patient has a normal gait. The patient typically uses a cane for ambulation. Tandem gait is minimally unsteady. Romberg is negative. No drift is seen.  Reflexes: Deep tendon reflexes are symmetric.   Assessment/Plan:  1. Migraine headache  2. Chronic pain syndrome  3. Nonorganic neurologic examination  The patient is doing somewhat better with her headaches, she is no longer having daily headaches. She continues to have nonorganic sensory changes on clinical examination. She was given a prescription for her Axert, she will follow-up in one year or sooner if needed.  Jill Alexanders MD 01/21/2015 5:42 PM  [facility] [address] [place], [place] [phone]  Phone [phone] Fax [phone]

## 2015-01-21 NOTE — Telephone Encounter (Signed)
Pt filling out intake form for another [facility], she is asking for first visit with Dr Alvy Bimler and how to spell lymphadenopathy.

## 2015-01-31 LAB — HM HEPATITIS C SCREENING LAB: HM HEPATITIS C SCREENING: NEGATIVE

## 2015-02-02 ENCOUNTER — Encounter: Payer: Self-pay | Admitting: Physical Medicine & Rehabilitation

## 2015-02-02 ENCOUNTER — Encounter: Payer: 59 | Attending: Physical Medicine and Rehabilitation | Admitting: Physical Medicine & Rehabilitation

## 2015-02-02 VITALS — BP 136/78 | HR 112 | Resp 16

## 2015-02-02 DIAGNOSIS — G43519 Persistent migraine aura without cerebral infarction, intractable, without status migrainosus: Secondary | ICD-10-CM | POA: Diagnosis present

## 2015-02-02 DIAGNOSIS — Z79899 Other long term (current) drug therapy: Secondary | ICD-10-CM | POA: Insufficient documentation

## 2015-02-02 DIAGNOSIS — M797 Fibromyalgia: Secondary | ICD-10-CM

## 2015-02-02 DIAGNOSIS — G894 Chronic pain syndrome: Secondary | ICD-10-CM | POA: Diagnosis not present

## 2015-02-02 DIAGNOSIS — F0781 Postconcussional syndrome: Secondary | ICD-10-CM | POA: Diagnosis present

## 2015-02-02 DIAGNOSIS — M4716 Other spondylosis with myelopathy, lumbar region: Secondary | ICD-10-CM | POA: Insufficient documentation

## 2015-02-02 DIAGNOSIS — Z5181 Encounter for therapeutic drug level monitoring: Secondary | ICD-10-CM | POA: Diagnosis present

## 2015-02-02 DIAGNOSIS — M5136 Other intervertebral disc degeneration, lumbar region: Secondary | ICD-10-CM | POA: Diagnosis present

## 2015-02-02 MED ORDER — FENTANYL 25 MCG/HR TD PT72
25.0000 ug | MEDICATED_PATCH | TRANSDERMAL | Status: DC
Start: 2015-02-02 — End: 2015-03-08

## 2015-02-02 MED ORDER — HYDROCODONE-ACETAMINOPHEN 5-325 MG PO TABS
1.0000 | ORAL_TABLET | Freq: Four times a day (QID) | ORAL | Status: DC | PRN
Start: 2015-02-02 — End: 2015-03-08

## 2015-02-02 NOTE — Progress Notes (Signed)
Procedure: Trigger point injection Dx: myofascial pain, traps, low back pain  After informed consent and preparation of the skin with isopropyl alcohol, I injected the left splenius capitus, left trap, bilateral L5 paraspinals with 2cc of 1% lidocaine. The patient tolerated well, and no complications were experienced. Post-injection instructions were provided.  Fentanyl and hydrocodone were refilled today also

## 2015-02-02 NOTE — Patient Instructions (Signed)
PLEASE CALL ME WITH ANY PROBLEMS OR QUESTIONS (#[phone]).      

## 2015-02-15 ENCOUNTER — Encounter: Payer: Self-pay | Admitting: Podiatry

## 2015-02-15 ENCOUNTER — Ambulatory Visit (INDEPENDENT_AMBULATORY_CARE_PROVIDER_SITE_OTHER): Payer: 59 | Admitting: Podiatry

## 2015-02-15 VITALS — BP 135/78 | HR 74 | Resp 15

## 2015-02-15 DIAGNOSIS — M205X1 Other deformities of toe(s) (acquired), right foot: Secondary | ICD-10-CM

## 2015-02-15 DIAGNOSIS — M779 Enthesopathy, unspecified: Secondary | ICD-10-CM

## 2015-02-15 DIAGNOSIS — M79671 Pain in right foot: Secondary | ICD-10-CM

## 2015-02-15 MED ORDER — TRIAMCINOLONE ACETONIDE 10 MG/ML IJ SUSP
10.0000 mg | Freq: Once | INTRAMUSCULAR | Status: AC
  Administered 2015-02-15: 10 mg

## 2015-02-15 NOTE — Progress Notes (Signed)
   Subjective:    Patient ID: Patty Salas, female    DOB: 10-19-1955, 59 y.o.   MRN: [id number]  HPI  Pt presents with right foot pain and swelling, possible gout and inflamation  Review of Systems  All other systems reviewed and are negative.      Objective:   Physical Exam        Assessment & Plan:

## 2015-02-16 NOTE — Progress Notes (Signed)
Subjective:     Patient ID: Patty Salas, female   DOB: 1956-08-10, 59 y.o.   MRN: [id number]  HPI patient presents after seeing another doctor recently with pain in the big toe joint right foot. She is in very poor health and is being seen by numerous physicians and has long-term history of numerous allergies   Review of Systems     Objective:   Physical Exam Neurovascular status unchanged with discomfort around the first MPJ right with reduced range of motion a significant nature and crepitus within the joint with inflammation of both the dorsal lateral and slightly plantar surface    Assessment:     Inflammatory changes of the right first MPJ with other issues that are difficult to decide as to whether or not there part of her pain syndrome she is experiencing. She does see a pain physician for different problems    Plan:     Careful injection of the right first MPJ 3 mg Kenalog 5 mill grams Xylocaine and advised on physical therapy for this and reduced activity. Reappoint if symptoms persist

## 2015-02-23 ENCOUNTER — Ambulatory Visit: Payer: 59 | Admitting: Podiatry

## 2015-02-26 ENCOUNTER — Ambulatory Visit (INDEPENDENT_AMBULATORY_CARE_PROVIDER_SITE_OTHER): Payer: 59 | Admitting: Podiatry

## 2015-02-26 ENCOUNTER — Encounter: Payer: Self-pay | Admitting: Podiatry

## 2015-02-26 VITALS — BP 124/82 | HR 101 | Resp 12

## 2015-02-26 DIAGNOSIS — Q828 Other specified congenital malformations of skin: Secondary | ICD-10-CM

## 2015-02-26 DIAGNOSIS — M205X1 Other deformities of toe(s) (acquired), right foot: Secondary | ICD-10-CM

## 2015-03-02 NOTE — Progress Notes (Signed)
Subjective:     Patient ID: Patty Salas, female   DOB: August 29, 1956, 60 y.o.   MRN: [id number]  HPI patient presents stating my right big toe felt better for a few days but now starting to hurt me again and I do have some anything wrong with me I don't know what can be done   Review of Systems     Objective:   Physical Exam Neurovascular status unchanged from previous visit with discomfort around the first MPJ right that has moderated from previous visit but still is present    Assessment:     Inflammatory changes of the right big toe that is improved but still present    Plan:     Reviewed condition and at this point recommended physical therapy and supportive shoe gear usage. Patient is discharged and less she needs to be seen again

## 2015-03-03 ENCOUNTER — Telehealth: Payer: Self-pay | Admitting: *Deleted

## 2015-03-03 NOTE — Telephone Encounter (Signed)
Patty Salas called and said she has IBS and is not feeling like coming to her appointment tomorrow and would like to move it to Friday if possible. I told her there is nothing available Friday so she will keep tomorrows appt.

## 2015-03-04 ENCOUNTER — Encounter: Payer: 59 | Admitting: Registered Nurse

## 2015-03-08 ENCOUNTER — Encounter: Payer: Self-pay | Admitting: Registered Nurse

## 2015-03-08 ENCOUNTER — Encounter: Payer: 59 | Attending: Physical Medicine and Rehabilitation | Admitting: Registered Nurse

## 2015-03-08 VITALS — BP 141/71 | HR 98 | Resp 16

## 2015-03-08 DIAGNOSIS — F0781 Postconcussional syndrome: Secondary | ICD-10-CM | POA: Insufficient documentation

## 2015-03-08 DIAGNOSIS — M797 Fibromyalgia: Secondary | ICD-10-CM | POA: Diagnosis present

## 2015-03-08 DIAGNOSIS — G43519 Persistent migraine aura without cerebral infarction, intractable, without status migrainosus: Secondary | ICD-10-CM | POA: Insufficient documentation

## 2015-03-08 DIAGNOSIS — M5136 Other intervertebral disc degeneration, lumbar region: Secondary | ICD-10-CM | POA: Diagnosis not present

## 2015-03-08 DIAGNOSIS — M4716 Other spondylosis with myelopathy, lumbar region: Secondary | ICD-10-CM | POA: Diagnosis not present

## 2015-03-08 DIAGNOSIS — Z5181 Encounter for therapeutic drug level monitoring: Secondary | ICD-10-CM | POA: Diagnosis present

## 2015-03-08 DIAGNOSIS — Z79899 Other long term (current) drug therapy: Secondary | ICD-10-CM

## 2015-03-08 DIAGNOSIS — G894 Chronic pain syndrome: Secondary | ICD-10-CM | POA: Diagnosis not present

## 2015-03-08 MED ORDER — FENTANYL 25 MCG/HR TD PT72: 25.0000 ug | MEDICATED_PATCH | TRANSDERMAL | Status: DC

## 2015-03-08 MED ORDER — HYDROCODONE-ACETAMINOPHEN 5-325 MG PO TABS: 1.0000 | ORAL_TABLET | Freq: Four times a day (QID) | ORAL | Status: DC | PRN

## 2015-03-08 NOTE — Progress Notes (Signed)
Subjective:    Patient ID: Patty Salas, female    DOB: September 06, 1956, 59 y.o.   MRN: [id number]  HPI: [name] is a 59 year old female who returns for follow up for chronic pain and medication refill. She says her pain is located in her neck andlower back.. She rates her pain 7. Her current exercise regime she is performing stretching exercises, zip hallow exercises and walking. Also using heat and ice therapy.  Pain Inventory Average Pain 8 Pain Right Now 7 My pain is constant, sharp, burning and stabbing  In the last 24 hours, has pain interfered with the following? General activity 9 Relation with others 10 Enjoyment of life 10 What TIME of day is your pain at its worst? all Sleep (in general) Fair  Pain is worse with: walking, bending, standing and some activites Pain improves with: rest, heat/ice, therapy/exercise, medication and injections Relief from Meds: 5  Mobility walk without assistance walk with assistance use a cane how many minutes can you walk? 5 ability to climb steps?  no do you drive?  yes  Function disabled: date disabled . I need assistance with the following:  meal prep, household duties and shopping  Neuro/Psych bladder control problems bowel control problems weakness numbness spasms dizziness confusion depression anxiety  Prior Studies Any changes since last visit?  no  Physicians involved in your care Any changes since last visit?  no   Family History  Problem Relation Age of Onset  . Cancer Mother   . Migraines Mother   . Heart disease Father   . Hypertension Father   . Cancer Father     colon  . Hypertension Brother   . Migraines Brother   . Hypertension Brother   . Migraines Brother   . Migraines Daughter    History   Social History  . Marital Status: Married    Spouse Name: N/A  . Number of Children: 1  . Years of Education: COLLEGE1   Occupational History  . HOUSEWIFE    Social History Main  Topics  . Smoking status: Never Smoker   . Smokeless tobacco: Never Used  . Alcohol Use: No  . Drug Use: No  . Sexual Activity: Not on file   Other Topics Concern  . None   Social History Narrative   Patient is right handed.   Patient drinks caffeine occasionally.   Past Surgical History  Procedure Laterality Date  . Neck surgery  2002  . Foot surgery      lt  . Cystoscopy    . Abdominal adhesion surgery    . Colonoscopy    . Cholecystectomy  1985  . Tonsillectomy  1976  . Appendectomy  1993  . Abdominal hysterectomy  1995  . Colonoscopy    . Lithotripsy    . Lymph node biopsy Right 03/06/2014    Procedure: right groin LYMPH NODE BIOPSY;  Surgeon: Merrie Roof, MD;  Location: [facility];  Service: General;  Laterality: Right;   Past Medical History  Diagnosis Date  . Myalgia and myositis, unspecified   . Fibromyalgia   . Anxiety   . Depression   . Cervical facet syndrome   . Calcifying tendinitis of shoulder   . Carpal tunnel syndrome   . Thoracic radiculopathy   . Headache(784.0)   . Hypertension   . Bipolar affective   . Gout   . Asthma   . Renal calculi   . Diverticulosis   .  PTSD (post-traumatic stress disorder)   . Full dentures   . Wears glasses   . Falls    BP 154/68 mmHg  Pulse 101  Resp 16  SpO2 97%  Opioid Risk Score:   Fall Risk Score: Low Fall Risk (0-5 points)`1  Depression screen PHQ 2/9  No flowsheet data found.  Review of Systems  Cardiovascular: Positive for leg swelling.  Gastrointestinal: Positive for nausea, abdominal pain, diarrhea and constipation.       Bowel Control Problems  Genitourinary:       Bladder control problems  Musculoskeletal: Positive for gait problem.       Spasms  Neurological: Positive for dizziness, weakness and numbness.  Psychiatric/Behavioral: Positive for confusion and dysphoric mood. The patient is nervous/anxious.   All other systems reviewed and are negative.      Objective:     Physical Exam  Constitutional: She is oriented to person, place, and time. She appears well-developed and well-nourished.  HENT:  Head: Normocephalic and atraumatic.  Neck: Normal range of motion. Neck supple.  Cardiovascular: Normal rate and regular rhythm.   Pulmonary/Chest: Effort normal and breath sounds normal.  Musculoskeletal:  Normal Muscle Bulk and Muscle Testing Reveals: Upper Extremities: Full ROM and Muscle Strength 5/5 Thoracic and Lumbar Hypersensitivity Lower Extremities: Full ROM and Muscle strength 5/5 Bilateral Lower Extremity Flexion Produces pain into Lumbar Arises from chair with ease Narrow Based Gait  Neurological: She is alert and oriented to person, place, and time.  Skin: Skin is warm and dry.  Psychiatric: She has a normal mood and affect.  Nursing note and vitals reviewed.         Assessment & Plan:  1. History of fibromyalgia with myofascial pain and multiple trigger points. Continue with Heat and exercise Regime. Continue with gabapentin.  2. Chronic migraine headaches. No Complaints 3. Lumbar degenerative disk disease, L4-5 :  Refilled: Fentanyl Patch 25 mcg one patch every three days #10 and Hydrocodone 5/325 mg one tablet every 6 hours as needed #90. Second script given to accommodate scheduled appointment. 4. History of nephrolithiasis. Cysts on left kidney: Urology Following  5. Right CTS: No Complaints today. Wrist stabilizer intact. 6. 30 minutes of face to face patient care time was spent during this visit. All questions were encouraged and answered.   F/U in 1 month

## 2015-04-09 ENCOUNTER — Ambulatory Visit (INDEPENDENT_AMBULATORY_CARE_PROVIDER_SITE_OTHER): Payer: 59 | Admitting: Podiatry

## 2015-04-09 ENCOUNTER — Encounter: Payer: Self-pay | Admitting: Podiatry

## 2015-04-09 VITALS — BP 121/75 | HR 100 | Resp 16

## 2015-04-09 DIAGNOSIS — Q828 Other specified congenital malformations of skin: Secondary | ICD-10-CM

## 2015-04-09 NOTE — Progress Notes (Signed)
   Subjective:    Patient ID: Patty Salas, female    DOB: 16-Mar-1956, 59 y.o.   MRN: [id number]  HPI "It's worsening of the problem on the bottom of my foot.  He pulled those things out before and it felt better."    Review of Systems     Objective:   Physical Exam        Assessment & Plan:

## 2015-04-10 NOTE — Progress Notes (Signed)
Subjective:     Patient ID: RANEISHA BRESS, female   DOB: 1956/06/28, 59 y.o.   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

## 2015-04-12 ENCOUNTER — Telehealth: Payer: Self-pay | Admitting: *Deleted

## 2015-04-12 NOTE — Telephone Encounter (Signed)
Pt left VM requesting we fax over Dr. Calton Dach office note to Dr. Liliane Channel office.  She has appt to see Dr. Earlean Shawl tomorrow at 2:15 pm.   Faxed over copy of recent office note and CT scan report to Dr. Earlean Shawl at fax [phone].

## 2015-04-16 ENCOUNTER — Ambulatory Visit: Payer: 59 | Admitting: Podiatry

## 2015-04-19 ENCOUNTER — Encounter: Payer: 59 | Admitting: Registered Nurse

## 2015-04-20 ENCOUNTER — Telehealth: Payer: Self-pay | Admitting: *Deleted

## 2015-04-20 NOTE — Telephone Encounter (Signed)
FYI call from the pt..........she had to cancel her appt as she was running a fever of 101.35F with diarrhea, vomiting and poor appetite. She says she is having problems with her colon and is scheduled for a colonoscopy Aug 1st. She is rescheduled with [name] on the 27th of July and has a future appt with Dr. Naaman Plummer on Aug 17th with mention of getting trigger points. Patient did not pose any questions that needed to be answered. She seemed to be announcing to our clinic all the health issues she is currently enduring.

## 2015-04-28 ENCOUNTER — Encounter: Payer: 59 | Attending: Physical Medicine and Rehabilitation | Admitting: Registered Nurse

## 2015-04-28 ENCOUNTER — Encounter: Payer: Self-pay | Admitting: Registered Nurse

## 2015-04-28 VITALS — BP 128/68 | HR 100 | Resp 14

## 2015-04-28 DIAGNOSIS — M5136 Other intervertebral disc degeneration, lumbar region: Secondary | ICD-10-CM | POA: Insufficient documentation

## 2015-04-28 DIAGNOSIS — F0781 Postconcussional syndrome: Secondary | ICD-10-CM | POA: Insufficient documentation

## 2015-04-28 DIAGNOSIS — G894 Chronic pain syndrome: Secondary | ICD-10-CM | POA: Insufficient documentation

## 2015-04-28 DIAGNOSIS — Z5181 Encounter for therapeutic drug level monitoring: Secondary | ICD-10-CM | POA: Diagnosis present

## 2015-04-28 DIAGNOSIS — M4716 Other spondylosis with myelopathy, lumbar region: Secondary | ICD-10-CM | POA: Diagnosis not present

## 2015-04-28 DIAGNOSIS — M797 Fibromyalgia: Secondary | ICD-10-CM

## 2015-04-28 DIAGNOSIS — G43519 Persistent migraine aura without cerebral infarction, intractable, without status migrainosus: Secondary | ICD-10-CM | POA: Insufficient documentation

## 2015-04-28 DIAGNOSIS — Z79899 Other long term (current) drug therapy: Secondary | ICD-10-CM | POA: Diagnosis present

## 2015-04-28 MED ORDER — FENTANYL 25 MCG/HR TD PT72: 25.0000 ug | MEDICATED_PATCH | TRANSDERMAL | Status: DC

## 2015-04-28 MED ORDER — HYDROCODONE-ACETAMINOPHEN 5-325 MG PO TABS: 1.0000 | ORAL_TABLET | Freq: Four times a day (QID) | ORAL | Status: DC | PRN

## 2015-04-28 NOTE — Progress Notes (Signed)
Subjective:    Patient ID: Patty Salas, female    DOB: October 09, 1955, 59 y.o.   MRN: [id number]  HPI: [name] is a 59 year old female who returns for follow up for chronic pain and medication refill. She says her pain is located in her neck and lower back. She rates her pain 8. Her current exercise regime she is performing stretching exercises, zip hallow exercises and walking. Also using heat and ice therapy. Also states last month she fell over a baby gate landed on her right side, she was able to pick herself up. She didn't seek medical attention. She's scheduled for colonoscopy 05/03/15.  Pain Inventory Average Pain 9 Pain Right Now 8 My pain is constant, sharp, burning and stabbing  In the last 24 hours, has pain interfered with the following? General activity 8 Relation with others 9 Enjoyment of life 9 What TIME of day is your pain at its worst? ALL Sleep (in general) Fair  Pain is worse with: walking, bending, sitting, standing and some activites Pain improves with: rest, heat/ice, therapy/exercise, medication and injections Relief from Meds: 5  Mobility walk without assistance use a cane how many minutes can you walk? 5 ability to climb steps?  yes do you drive?  yes  Function disabled: date disabled .  Neuro/Psych bladder control problems bowel control problems weakness numbness trouble walking spasms dizziness confusion depression anxiety  Prior Studies Any changes since last visit?  no  Physicians involved in your care Any changes since last visit?  no   Family History  Problem Relation Age of Onset  . Cancer Mother   . Migraines Mother   . Heart disease Father   . Hypertension Father   . Cancer Father     colon  . Hypertension Brother   . Migraines Brother   . Hypertension Brother   . Migraines Brother   . Migraines Daughter    History   Social History  . Marital Status: Married    Spouse Name: N/A  . Number of  Children: 1  . Years of Education: COLLEGE1   Occupational History  . HOUSEWIFE    Social History Main Topics  . Smoking status: Never Smoker   . Smokeless tobacco: Never Used  . Alcohol Use: No  . Drug Use: No  . Sexual Activity: Not on file   Other Topics Concern  . None   Social History Narrative   Patient is right handed.   Patient drinks caffeine occasionally.   Past Surgical History  Procedure Laterality Date  . Neck surgery  2002  . Foot surgery      lt  . Cystoscopy    . Abdominal adhesion surgery    . Colonoscopy    . Cholecystectomy  1985  . Tonsillectomy  1976  . Appendectomy  1993  . Abdominal hysterectomy  1995  . Colonoscopy    . Lithotripsy    . Lymph node biopsy Right 03/06/2014    Procedure: right groin LYMPH NODE BIOPSY;  Surgeon: Merrie Roof, MD;  Location: [facility];  Service: General;  Laterality: Right;   Past Medical History  Diagnosis Date  . Myalgia and myositis, unspecified   . Fibromyalgia   . Anxiety   . Depression   . Cervical facet syndrome   . Calcifying tendinitis of shoulder   . Carpal tunnel syndrome   . Thoracic radiculopathy   . Headache(784.0)   . Hypertension   .  Bipolar affective   . Gout   . Asthma   . Renal calculi   . Diverticulosis   . PTSD (post-traumatic stress disorder)   . Full dentures   . Wears glasses   . Falls    BP 128/68 mmHg  Pulse 100  Resp 14  SpO2 100%  Opioid Risk Score:   Fall Risk Score:  `1  Depression screen PHQ 2/9  No flowsheet data found.   Review of Systems  Constitutional: Positive for unexpected weight change.  HENT: Negative.   Eyes: Negative.   Respiratory: Negative.   Cardiovascular: Negative.   Gastrointestinal: Positive for nausea, vomiting, abdominal pain, diarrhea and constipation.       Bowel control problems  Endocrine: Negative.   Genitourinary:       Bladder control problems  Musculoskeletal: Positive for myalgias, back pain, joint  swelling, arthralgias and neck pain.  Skin: Negative.   Allergic/Immunologic: Negative.   Neurological: Positive for weakness and numbness.       Trouble walking, spasms  Hematological: Negative.   Psychiatric/Behavioral: Positive for confusion, dysphoric mood and decreased concentration. The patient is nervous/anxious.        Objective:   Physical Exam  Constitutional: She is oriented to person, place, and time. She appears well-developed and well-nourished.  HENT:  Head: Normocephalic and atraumatic.  Neck: Normal range of motion. Neck supple.  Cardiovascular: Normal rate and regular rhythm.   Pulmonary/Chest: Effort normal and breath sounds normal.  Musculoskeletal:  Normal Muscle Bulk and Muscle Testing Reveals: Upper Extremities: Full ROM and Muscle Strength 5/5 Lumbar Paraspinal Tenderness: L-3- L-5 Lower Extremities: Full ROM and Muscle Strength 5/5 Arises from chair with ease Narrow Based Gait  Neurological: She is alert and oriented to person, place, and time.  Skin: Skin is warm and dry.  Psychiatric: She has a normal mood and affect.  Nursing note and vitals reviewed.         Assessment & Plan:  1. History of fibromyalgia with myofascial pain and multiple trigger points. Continue with Heat and exercise Regime. Continue with gabapentin.  2. Chronic migraine headaches. No Complaints 3. Lumbar degenerative disk disease, L4-5 :  Refilled: Fentanyl Patch 25 mcg one patch every three days #10 and Hydrocodone 5/325 mg one tablet every 6 hours as needed #90.  4. History of nephrolithiasis. Cysts on left kidney: Urology Following  5. Right CTS: No Complaints today. Wrist stabilizer intact. 6. 30 minutes of face to face patient care time was spent during this visit. All questions were encouraged and answered.   F/U in 1 month

## 2015-05-03 ENCOUNTER — Telehealth: Payer: Self-pay | Admitting: *Deleted

## 2015-05-03 NOTE — Telephone Encounter (Signed)
Patty Salas called back. She has just gotten back from a colonoscopy and they removed 5 polyps.  She is supposed to take it easy for a few days.  She will not be able to come to get trigger points on Wednesday.

## 2015-05-03 NOTE — Telephone Encounter (Signed)
Called patient and left message that Dr. Naaman Plummer had a cancellation on Wednesday 8/3 @ 11:40 and we would be able to do her trigger point injection at the time. I scheduled her and explained that if she cannot keep this appointment to call.

## 2015-05-05 ENCOUNTER — Ambulatory Visit: Payer: 59 | Admitting: Physical Medicine & Rehabilitation

## 2015-05-18 ENCOUNTER — Encounter: Payer: 59 | Attending: Physical Medicine and Rehabilitation | Admitting: Registered Nurse

## 2015-05-18 ENCOUNTER — Encounter: Payer: Self-pay | Admitting: Registered Nurse

## 2015-05-18 ENCOUNTER — Other Ambulatory Visit: Payer: Self-pay | Admitting: Registered Nurse

## 2015-05-18 VITALS — BP 127/72 | HR 106

## 2015-05-18 DIAGNOSIS — G894 Chronic pain syndrome: Secondary | ICD-10-CM

## 2015-05-18 DIAGNOSIS — F0781 Postconcussional syndrome: Secondary | ICD-10-CM | POA: Insufficient documentation

## 2015-05-18 DIAGNOSIS — Z5181 Encounter for therapeutic drug level monitoring: Secondary | ICD-10-CM

## 2015-05-18 DIAGNOSIS — M51369 Other intervertebral disc degeneration, lumbar region without mention of lumbar back pain or lower extremity pain: Secondary | ICD-10-CM

## 2015-05-18 DIAGNOSIS — M4716 Other spondylosis with myelopathy, lumbar region: Secondary | ICD-10-CM

## 2015-05-18 DIAGNOSIS — G43519 Persistent migraine aura without cerebral infarction, intractable, without status migrainosus: Secondary | ICD-10-CM | POA: Diagnosis present

## 2015-05-18 DIAGNOSIS — Z79899 Other long term (current) drug therapy: Secondary | ICD-10-CM | POA: Diagnosis present

## 2015-05-18 DIAGNOSIS — M5136 Other intervertebral disc degeneration, lumbar region: Secondary | ICD-10-CM | POA: Insufficient documentation

## 2015-05-18 DIAGNOSIS — M797 Fibromyalgia: Secondary | ICD-10-CM

## 2015-05-18 MED ORDER — FENTANYL 25 MCG/HR TD PT72: 25.0000 ug | MEDICATED_PATCH | TRANSDERMAL | Status: DC

## 2015-05-18 MED ORDER — HYDROCODONE-ACETAMINOPHEN 5-325 MG PO TABS: 1.0000 | ORAL_TABLET | Freq: Four times a day (QID) | ORAL | Status: DC | PRN

## 2015-05-18 NOTE — Addendum Note (Signed)
Addended by: Caro Hight on: 05/18/2015 04:32 PM   Modules accepted: Orders
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
## 2015-05-19 ENCOUNTER — Ambulatory Visit: Payer: 59 | Admitting: Physical Medicine & Rehabilitation

## 2015-05-19 LAB — PMP ALCOHOL METABOLITE (ETG): Ethyl Glucuronide (EtG): NEGATIVE ng/mL

## 2015-05-20 ENCOUNTER — Telehealth: Payer: Self-pay | Admitting: Registered Nurse

## 2015-05-20 NOTE — Telephone Encounter (Signed)
Mrs. Strader called office on 05/19/2015 She states she is feeling better, she will make an appointment to see Dr. Reece Levy. Also states she will not be going to ED today. States if she start feeling bad she will go to the ED. Encouraged to follow up with her PCP she verbalizes understanding.

## 2015-05-22 LAB — BENZODIAZEPINES (GC/LC/MS), URINE
ALPRAZOLAMU: NEGATIVE ng/mL (ref <25)
Clonazepam metabolite (GC/LC/MS), ur confirm: NEGATIVE ng/mL (ref <25)
FLURAZEPAMU: NEGATIVE ng/mL (ref <50)
Lorazepam (GC/LC/MS), ur confirm: 4273 ng/mL (ref <50)
MIDAZOLAMU: NEGATIVE ng/mL (ref <50)
Nordiazepam (GC/LC/MS), ur confirm: NEGATIVE ng/mL (ref <50)
Oxazepam (GC/LC/MS), ur confirm: NEGATIVE ng/mL (ref <50)
TEMAZEPAMU: NEGATIVE ng/mL (ref <50)
Triazolam metabolite (GC/LC/MS), ur confirm: NEGATIVE ng/mL (ref <50)

## 2015-05-22 LAB — FENTANYL (GC/LC/MS), URINE
Fentanyl, confirm: 6.7 ng/mL (ref <0.5)
Norfentanyl, confirm: 209.1 ng/mL (ref <0.5)

## 2015-05-22 LAB — OPIATES/OPIOIDS (LC/MS-MS)
CODEINE URINE: NEGATIVE ng/mL (ref <50)
HYDROMORPHONE: 352 ng/mL (ref <50)
Hydrocodone: 484 ng/mL (ref <50)
MORPHINE: NEGATIVE ng/mL (ref <50)
Norhydrocodone, Ur: 1675 ng/mL (ref <50)
Noroxycodone, Ur: NEGATIVE ng/mL (ref <50)
Oxycodone, ur: NEGATIVE ng/mL (ref <50)
Oxymorphone: NEGATIVE ng/mL (ref <50)

## 2015-05-24 LAB — PRESCRIPTION MONITORING PROFILE (SOLSTAS)
Amphetamine/Meth: NEGATIVE ng/mL
BUPRENORPHINE, URINE: NEGATIVE ng/mL
Barbiturate Screen, Urine: NEGATIVE ng/mL
CANNABINOID SCRN UR: NEGATIVE ng/mL
CARISOPRODOL, URINE: NEGATIVE ng/mL
Cocaine Metabolites: NEGATIVE ng/mL
Creatinine, Urine: 130.01 mg/dL (ref >20.0)
ECSTASY: NEGATIVE ng/mL
METHADONE SCREEN, URINE: NEGATIVE ng/mL
Meperidine, Ur: NEGATIVE ng/mL
NITRITES URINE, INITIAL: NEGATIVE ug/mL
OXYCODONE SCRN UR: NEGATIVE ng/mL
PROPOXYPHENE: NEGATIVE ng/mL
Tapentadol, urine: NEGATIVE ng/mL
Tramadol Scrn, Ur: NEGATIVE ng/mL
Zolpidem, Urine: NEGATIVE ng/mL
pH, Initial: 6.7 pH (ref 4.5–8.9)

## 2015-05-28 NOTE — Progress Notes (Signed)
Urine drug screen for this encounter is consistent for prescribed medication 

## 2015-06-23 ENCOUNTER — Encounter: Payer: 59 | Admitting: Physical Medicine & Rehabilitation

## 2015-06-25 ENCOUNTER — Encounter: Payer: Self-pay | Admitting: Registered Nurse

## 2015-06-25 ENCOUNTER — Encounter: Payer: 59 | Attending: Physical Medicine and Rehabilitation | Admitting: Registered Nurse

## 2015-06-25 VITALS — BP 136/55 | HR 82

## 2015-06-25 DIAGNOSIS — F0781 Postconcussional syndrome: Secondary | ICD-10-CM | POA: Insufficient documentation

## 2015-06-25 DIAGNOSIS — M5136 Other intervertebral disc degeneration, lumbar region: Secondary | ICD-10-CM | POA: Insufficient documentation

## 2015-06-25 DIAGNOSIS — G894 Chronic pain syndrome: Secondary | ICD-10-CM | POA: Insufficient documentation

## 2015-06-25 DIAGNOSIS — Z79899 Other long term (current) drug therapy: Secondary | ICD-10-CM

## 2015-06-25 DIAGNOSIS — M4716 Other spondylosis with myelopathy, lumbar region: Secondary | ICD-10-CM | POA: Diagnosis present

## 2015-06-25 DIAGNOSIS — M797 Fibromyalgia: Secondary | ICD-10-CM | POA: Diagnosis present

## 2015-06-25 DIAGNOSIS — Z5181 Encounter for therapeutic drug level monitoring: Secondary | ICD-10-CM | POA: Diagnosis not present

## 2015-06-25 DIAGNOSIS — G43519 Persistent migraine aura without cerebral infarction, intractable, without status migrainosus: Secondary | ICD-10-CM | POA: Insufficient documentation

## 2015-06-25 MED ORDER — HYDROCODONE-ACETAMINOPHEN 5-325 MG PO TABS: 1.0000 | ORAL_TABLET | Freq: Four times a day (QID) | ORAL | Status: DC | PRN

## 2015-06-25 MED ORDER — FENTANYL 25 MCG/HR TD PT72: 25.0000 ug | MEDICATED_PATCH | TRANSDERMAL | Status: DC

## 2015-06-25 NOTE — Progress Notes (Signed)
Subjective:    Patient ID: Patty Salas, female    DOB: Mar 18, 1956, 59 y.o.   MRN: [id number]  HPI: Patty Salas is a 59 year old female who returns for follow up for chronic pain and medication refill. She says her pain is located in her neck, bilateral shoulder blades and lower back. She rates her pain 10. Her current exercise regime is performing stretching exercises and walking. Also using heat and ice therapy.  Pain Inventory Average Pain 10 Pain Right Now 10 My pain is constant, sharp, burning and stabbing  In the last 24 hours, has pain interfered with the following? General activity 10 Relation with others 10 Enjoyment of life 10 What TIME of day is your pain at its worst? morning, daytime, evening, night Sleep (in general) Fair  Pain is worse with: walking, bending, sitting, inactivity and standing Pain improves with: rest, heat/ice, therapy/exercise, medication and injections Relief from Meds: 4  Mobility walk without assistance walk with assistance use a cane use a walker how many minutes can you walk? 5 ability to climb steps?  yes do you drive?  yes needs help with transfers transfers alone Do you have any goals in this area?  yes  Function not employed: date last employed na disabled: date disabled na I need assistance with the following:  meal prep, household duties and shopping Do you have any goals in this area?  yes  Neuro/Psych bladder control problems bowel control problems weakness numbness trouble walking spasms dizziness confusion depression anxiety  Prior Studies Any changes since last visit?  no bone scan x-rays CT/MRI nerve study ultra sounds  Physicians involved in your care Any changes since last visit?  no Primary care on file Neurologist on file Psychiatrist on file other   Family History  Problem Relation Age of Onset  . Cancer Mother   . Migraines Mother   . Heart disease Father   . Hypertension  Father   . Cancer Father     colon  . Hypertension Brother   . Migraines Brother   . Hypertension Brother   . Migraines Brother   . Migraines Daughter    Social History   Social History  . Marital Status: Married    Spouse Name: N/A  . Number of Children: 1  . Years of Education: COLLEGE1   Occupational History  . HOUSEWIFE    Social History Main Topics  . Smoking status: Never Smoker   . Smokeless tobacco: Never Used  . Alcohol Use: No  . Drug Use: No  . Sexual Activity: Not Asked   Other Topics Concern  . None   Social History Narrative   Patient is right handed.   Patient drinks caffeine occasionally.   Past Surgical History  Procedure Laterality Date  . Neck surgery  2002  . Foot surgery      lt  . Cystoscopy    . Abdominal adhesion surgery    . Colonoscopy    . Cholecystectomy  1985  . Tonsillectomy  1976  . Appendectomy  1993  . Abdominal hysterectomy  1995  . Colonoscopy    . Lithotripsy    . Lymph node biopsy Right 03/06/2014    Procedure: right groin LYMPH NODE BIOPSY;  Surgeon: Merrie Roof, MD;  Location: [facility];  Service: General;  Laterality: Right;   Past Medical History  Diagnosis Date  . Myalgia and myositis, unspecified   . Fibromyalgia   .  Anxiety   . Depression   . Cervical facet syndrome   . Calcifying tendinitis of shoulder   . Carpal tunnel syndrome   . Thoracic radiculopathy   . Headache(784.0)   . Hypertension   . Bipolar affective   . Gout   . Asthma   . Renal calculi   . Diverticulosis   . PTSD (post-traumatic stress disorder)   . Full dentures   . Wears glasses   . Falls    BP 136/55 mmHg  Pulse 82  SpO2 99%  Opioid Risk Score:   Fall Risk Score:  `1  Depression screen PHQ 2/9  Depression screen PHQ 2/9 06/25/2015  Decreased Interest 2  Down, Depressed, Hopeless 2  PHQ - 2 Score 4     Review of Systems  Constitutional: Positive for unexpected weight change.  Gastrointestinal:  Positive for nausea, vomiting, abdominal pain and diarrhea.  All other systems reviewed and are negative.      Objective:   Physical Exam  Constitutional: She is oriented to person, place, and time. She appears well-developed and well-nourished.  HENT:  Head: Normocephalic and atraumatic.  Neck: Normal range of motion. Neck supple.  Cervical Paraspinal Tenderness: C-5- C-6  Cardiovascular: Normal rate and regular rhythm.   Pulmonary/Chest: Effort normal and breath sounds normal.  Musculoskeletal:  Normal Muscle Bulk and Muscle Testing Reveals: Upper Extremities: Full ROM and Muscle Strength 5/5 Thoracic Paraspinal Tenderness: T-1- T-3 Lumbar Paraspinal Tenderness: L-3- L-5 Lower Extremities: Full ROM and Muscle Strength 5/5 Bilateral Lower Extremities Flexion Produces Pain into Lumbar Arises from chair with ease Narrow Based gait  Neurological: She is alert and oriented to person, place, and time.  Skin: Skin is warm and dry.  Psychiatric: She has a normal mood and affect.  Nursing note and vitals reviewed.         Assessment & Plan:  1. History of fibromyalgia with myofascial pain and multiple trigger points. Continue with Heat and exercise Regime. Continue with gabapentin.  Schedule for Trigger Points with Dr. Naaman Plummer 2. Chronic migraine headaches.Continue to Monitor 3. Lumbar degenerative disk disease, L4-5 :  Refilled: Fentanyl Patch 25 mcg one patch every three days #10 and Hydrocodone 5/325 mg one tablet every 6 hours as needed #90.  4. History of nephrolithiasis. Cysts on left kidney: Urology Following  5. 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  30 minutes of face to face patient care time was spent during this visit. All questions were encouraged and answered.   F/U in 1 month

## 2015-07-13 ENCOUNTER — Encounter: Payer: 59 | Attending: Physical Medicine and Rehabilitation | Admitting: Physical Medicine & Rehabilitation

## 2015-07-13 ENCOUNTER — Encounter: Payer: Self-pay | Admitting: Physical Medicine & Rehabilitation

## 2015-07-13 VITALS — BP 148/70 | HR 108

## 2015-07-13 DIAGNOSIS — G894 Chronic pain syndrome: Secondary | ICD-10-CM | POA: Diagnosis not present

## 2015-07-13 DIAGNOSIS — Z5181 Encounter for therapeutic drug level monitoring: Secondary | ICD-10-CM | POA: Diagnosis present

## 2015-07-13 DIAGNOSIS — M4716 Other spondylosis with myelopathy, lumbar region: Secondary | ICD-10-CM | POA: Insufficient documentation

## 2015-07-13 DIAGNOSIS — G43519 Persistent migraine aura without cerebral infarction, intractable, without status migrainosus: Secondary | ICD-10-CM | POA: Diagnosis present

## 2015-07-13 DIAGNOSIS — F0781 Postconcussional syndrome: Secondary | ICD-10-CM | POA: Insufficient documentation

## 2015-07-13 DIAGNOSIS — M5136 Other intervertebral disc degeneration, lumbar region: Secondary | ICD-10-CM | POA: Diagnosis present

## 2015-07-13 DIAGNOSIS — Z79899 Other long term (current) drug therapy: Secondary | ICD-10-CM | POA: Diagnosis present

## 2015-07-13 DIAGNOSIS — M797 Fibromyalgia: Secondary | ICD-10-CM

## 2015-07-13 MED ORDER — HYDROCODONE-ACETAMINOPHEN 5-325 MG PO TABS: 1.0000 | ORAL_TABLET | Freq: Four times a day (QID) | ORAL | Status: DC | PRN

## 2015-07-13 NOTE — Progress Notes (Signed)
Procedure Note: Dx: myofascial pain with trigger points Procedure: trigger point injections  After informed consent and preparation of the skin with betadine and isopropyl alcohol, I injected 6mg  (1cc) of celestone and 4cc of 1% lidocaine into the left mid trapezius, left SCM, right mid trap, and left lumbar paraspinals (L4) for a total of 4 injections, palpating for point of maximal pain and muscle spasm in each location. Additionally, aspiration was performed prior to injection. The patient tolerated well, and no complications were encountered. Afterward the area was cleaned and dressed. Post- injection instructions were provided.   Hydrocodone was refilled today as well  Meredith Staggers, MD, [facility] 07/13/2015

## 2015-08-04 ENCOUNTER — Telehealth: Payer: Self-pay | Admitting: *Deleted

## 2015-08-04 NOTE — Telephone Encounter (Addendum)
Pt states the sweat glands have her foot swollen and she needs to get something done before the weekend she can't walk.  I told pt to call our appt line #[phone], and see if she could not get into see Dr. Jacqualyn Posey on Friday, and to use warm epsom salt water soaks for comfort.  Pt states understanding.  Pt was contacted for an appt and states she will take the 08/09/2015 appt with Dr. Paulla Dolly.  I encouraged pt to let us schedule her for a late afternoon appt in [place] or [place] today or [place] today and pt and husband both said no they will wait to see Dr. Paulla Dolly on Monday.

## 2015-08-05 ENCOUNTER — Encounter: Payer: 59 | Attending: Physical Medicine and Rehabilitation | Admitting: Registered Nurse

## 2015-08-05 ENCOUNTER — Encounter: Payer: Self-pay | Admitting: Registered Nurse

## 2015-08-05 VITALS — BP 125/46 | HR 95

## 2015-08-05 DIAGNOSIS — M5136 Other intervertebral disc degeneration, lumbar region: Secondary | ICD-10-CM

## 2015-08-05 DIAGNOSIS — G43519 Persistent migraine aura without cerebral infarction, intractable, without status migrainosus: Secondary | ICD-10-CM | POA: Diagnosis present

## 2015-08-05 DIAGNOSIS — F0781 Postconcussional syndrome: Secondary | ICD-10-CM | POA: Diagnosis present

## 2015-08-05 DIAGNOSIS — M797 Fibromyalgia: Secondary | ICD-10-CM

## 2015-08-05 DIAGNOSIS — G894 Chronic pain syndrome: Secondary | ICD-10-CM | POA: Diagnosis not present

## 2015-08-05 DIAGNOSIS — M4716 Other spondylosis with myelopathy, lumbar region: Secondary | ICD-10-CM

## 2015-08-05 DIAGNOSIS — Z5181 Encounter for therapeutic drug level monitoring: Secondary | ICD-10-CM | POA: Diagnosis present

## 2015-08-05 DIAGNOSIS — Z79899 Other long term (current) drug therapy: Secondary | ICD-10-CM | POA: Diagnosis present

## 2015-08-05 MED ORDER — FENTANYL 25 MCG/HR TD PT72: 25.0000 ug | MEDICATED_PATCH | TRANSDERMAL | Status: DC

## 2015-08-05 NOTE — Progress Notes (Signed)
Subjective:    Patient ID: Patty Salas, female    DOB: 02-06-1956, 59 y.o.   MRN: [id number]  HPI: [name] is a 59 year old female who returns for follow up for chronic pain and medication refill. She says her pain is located in her neck and lower back. She rates her pain 9. Her current exercise regime is performing stretching exercises and walking. Patty Salas has a full prescription of Hydrocodone no script given she verbalizes understanding.  Pain Inventory Average Pain 9 Pain Right Now 9 My pain is constant, sharp, burning and stabbing  In the last 24 hours, has pain interfered with the following? General activity 10 Relation with others 10 Enjoyment of life 10 What TIME of day is your pain at its worst? Morning, Evening and Night Sleep (in general) Fair  Pain is worse with: walking, bending, sitting, inactivity, standing, some activites and Climbing Steps Pain improves with: rest, heat/ice, therapy/exercise, medication and injections Relief from Meds: 3  Mobility walk without assistance walk with assistance use a cane use a walker how many minutes can you walk? 5 ability to climb steps?  yes do you drive?  yes needs help with transfers transfers alone Do you have any goals in this area?  yes  Function disabled: date disabled NA I need assistance with the following:  meal prep, household duties and shopping Do you have any goals in this area?  yes  Neuro/Psych bladder control problems bowel control problems weakness trouble walking spasms dizziness confusion depression anxiety  Prior Studies Any changes since last visit?  no  Physicians involved in your care Any changes since last visit?  no   Family History  Problem Relation Age of Onset  . Cancer Mother   . Migraines Mother   . Heart disease Father   . Hypertension Father   . Cancer Father     colon  . Hypertension Brother   . Migraines Brother   . Hypertension Brother   .  Migraines Brother   . Migraines Daughter    Social History   Social History  . Marital Status: Married    Spouse Name: N/A  . Number of Children: 1  . Years of Education: COLLEGE1   Occupational History  . HOUSEWIFE    Social History Main Topics  . Smoking status: Never Smoker   . Smokeless tobacco: Never Used  . Alcohol Use: No  . Drug Use: No  . Sexual Activity: Not Asked   Other Topics Concern  . None   Social History Narrative   Patient is right handed.   Patient drinks caffeine occasionally.   Past Surgical History  Procedure Laterality Date  . Neck surgery  2002  . Foot surgery      lt  . Cystoscopy    . Abdominal adhesion surgery    . Colonoscopy    . Cholecystectomy  1985  . Tonsillectomy  1976  . Appendectomy  1993  . Abdominal hysterectomy  1995  . Colonoscopy    . Lithotripsy    . Lymph node biopsy Right 03/06/2014    Procedure: right groin LYMPH NODE BIOPSY;  Surgeon: Merrie Roof, MD;  Location: [facility];  Service: General;  Laterality: Right;   Past Medical History  Diagnosis Date  . Myalgia and myositis, unspecified   . Fibromyalgia   . Anxiety   . Depression   . Cervical facet syndrome   . Calcifying tendinitis of  shoulder   . Carpal tunnel syndrome   . Thoracic radiculopathy   . Headache(784.0)   . Hypertension   . Bipolar affective ([place])   . Gout   . Asthma   . Renal calculi   . Diverticulosis   . PTSD (post-traumatic stress disorder)   . Full dentures   . Wears glasses   . Falls    BP 142/65 mmHg  Pulse 102  SpO2 95%  Opioid Risk Score:   Fall Risk Score:  `1  Depression screen PHQ 2/9  Depression screen PHQ 2/9 06/25/2015  Decreased Interest 2  Down, Depressed, Hopeless 2  PHQ - 2 Score 4     Review of Systems  Constitutional: Positive for unexpected weight change.  Gastrointestinal: Positive for nausea, vomiting, abdominal pain and diarrhea.       Bowel control problems  Genitourinary:        Bladder control problems  Musculoskeletal:       Spasms  Neurological: Positive for dizziness and weakness.       Gait Instability  Psychiatric/Behavioral: Positive for confusion. The patient is nervous/anxious.        Depression       Objective:   Physical Exam  Constitutional: She is oriented to person, place, and time. She appears well-developed and well-nourished.  HENT:  Head: Normocephalic and atraumatic.  Neck: Normal range of motion. Neck supple.  Cervical Paraspinal Tenderness: C-5- C-6  Cardiovascular: Normal rate and regular rhythm.   Pulmonary/Chest: Effort normal and breath sounds normal.  Musculoskeletal:  Normal Muscle Bulk and Muscle Testing Reveals: Upper Extremities: Full ROM and Muscle Strength 5/5 Bilateral AC Joint Tenderness Thoracic and Lumbar Hypersensitivity Lower Extremities: Full ROM and Muscle Strength 5/5 Left Lower Extremity Flexion Produces Pain into Lumbar Arises from chair with ease Narrow Based gait  Neurological: She is alert and oriented to person, place, and time.  Skin: Skin is warm and dry.  Psychiatric: She has a normal mood and affect.  Nursing note and vitals reviewed.         Assessment & Plan:  1. History of fibromyalgia with myofascial pain and multiple trigger points. Continue with Heat and exercise Regime. Continue with gabapentin.  2. Chronic migraine headaches.Continue to Monitor 3. Lumbar degenerative disk disease, L4-5 :  Refilled: Fentanyl Patch 25 mcg one patch every three days #10. Continue Hydrocodone 5/325 mg one tablet every 6 hours as needed #90. No script given. 4. History of nephrolithiasis. Cysts on left kidney: Urology Following  5. 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  20 minutes of face to face patient care time was spent during this visit. All questions were encouraged and answered.   F/U in 1 month

## 2015-08-09 ENCOUNTER — Encounter: Payer: Self-pay | Admitting: Podiatry

## 2015-08-09 ENCOUNTER — Ambulatory Visit (INDEPENDENT_AMBULATORY_CARE_PROVIDER_SITE_OTHER): Payer: 59 | Admitting: Podiatry

## 2015-08-09 DIAGNOSIS — Q828 Other specified congenital malformations of skin: Secondary | ICD-10-CM | POA: Diagnosis not present

## 2015-08-09 DIAGNOSIS — M79671 Pain in right foot: Secondary | ICD-10-CM

## 2015-08-11 NOTE — Progress Notes (Signed)
Subjective:     Patient ID: Patty Salas, female   DOB: [birth year], 59 y.o.   MRN: [id number]  HPI patient presents with lesions on the bottom of the right foot that are painful   Review of Systems     Objective:   Physical Exam  neurovascular status intact with keratotic lesion sub-metatarsal right    Assessment:      porokeratosis    Plan:      debride lesions right with no iatrogenic bleeding noted

## 2015-09-02 ENCOUNTER — Other Ambulatory Visit: Payer: Self-pay | Admitting: Neurology

## 2015-09-06 ENCOUNTER — Encounter: Payer: Self-pay | Admitting: Physical Medicine & Rehabilitation

## 2015-09-06 ENCOUNTER — Encounter: Payer: 59 | Attending: Physical Medicine and Rehabilitation | Admitting: Physical Medicine & Rehabilitation

## 2015-09-06 VITALS — BP 131/72 | HR 109

## 2015-09-06 DIAGNOSIS — M797 Fibromyalgia: Secondary | ICD-10-CM | POA: Diagnosis present

## 2015-09-06 DIAGNOSIS — M47816 Spondylosis without myelopathy or radiculopathy, lumbar region: Secondary | ICD-10-CM | POA: Diagnosis not present

## 2015-09-06 DIAGNOSIS — M4716 Other spondylosis with myelopathy, lumbar region: Secondary | ICD-10-CM | POA: Diagnosis present

## 2015-09-06 DIAGNOSIS — Z79899 Other long term (current) drug therapy: Secondary | ICD-10-CM | POA: Diagnosis present

## 2015-09-06 DIAGNOSIS — M5136 Other intervertebral disc degeneration, lumbar region: Secondary | ICD-10-CM | POA: Diagnosis present

## 2015-09-06 DIAGNOSIS — F0781 Postconcussional syndrome: Secondary | ICD-10-CM | POA: Diagnosis present

## 2015-09-06 DIAGNOSIS — M7712 Lateral epicondylitis, left elbow: Secondary | ICD-10-CM | POA: Insufficient documentation

## 2015-09-06 DIAGNOSIS — G43519 Persistent migraine aura without cerebral infarction, intractable, without status migrainosus: Secondary | ICD-10-CM | POA: Diagnosis not present

## 2015-09-06 DIAGNOSIS — G894 Chronic pain syndrome: Secondary | ICD-10-CM | POA: Insufficient documentation

## 2015-09-06 DIAGNOSIS — Z5181 Encounter for therapeutic drug level monitoring: Secondary | ICD-10-CM | POA: Insufficient documentation

## 2015-09-06 MED ORDER — HYDROCODONE-ACETAMINOPHEN 5-325 MG PO TABS: 1.0000 | ORAL_TABLET | Freq: Four times a day (QID) | ORAL | Status: DC | PRN

## 2015-09-06 MED ORDER — FENTANYL 25 MCG/HR TD PT72: 25.0000 ug | MEDICATED_PATCH | TRANSDERMAL | Status: DC

## 2015-09-06 NOTE — Progress Notes (Signed)
Subjective:    Patient ID: Patty Salas, female    DOB: 28-Jan-1956, 59 y.o.   MRN: [id number]  HPI  Pain Inventory Average Pain 10 Pain Right Now 10 My pain is constant, sharp, burning and stabbing  In the last 24 hours, has pain interfered with the following? General activity 10 Relation with others 10 Enjoyment of life 10 What TIME of day is your pain at its worst? Morning, Daytime, Evening and Night Sleep (in general) Fair  Pain is worse with: walking, bending, sitting, inactivity, standing and some activites Pain improves with: rest, heat/ice, therapy/exercise, medication and injections Relief from Meds: 5  Mobility walk without assistance walk with assistance use a cane use a walker how many minutes can you walk? 5 ability to climb steps?  yes do you drive?  yes needs help with transfers transfers alone  Function disabled: date disabled NA I need assistance with the following:  meal prep, household duties and shopping Do you have any goals in this area?  yes  Neuro/Psych bladder control problems bowel control problems weakness trouble walking spasms dizziness confusion depression anxiety  Prior Studies Any changes since last visit?  yes  Physicians involved in your care Any changes since last visit?  yes   Family History  Problem Relation Age of Onset  . Cancer Mother   . Migraines Mother   . Heart disease Father   . Hypertension Father   . Cancer Father     colon  . Hypertension Brother   . Migraines Brother   . Hypertension Brother   . Migraines Brother   . Migraines Daughter    Social History   Social History  . Marital Status: Married    Spouse Name: N/A  . Number of Children: 1  . Years of Education: COLLEGE1   Occupational History  . HOUSEWIFE    Social History Main Topics  . Smoking status: Never Smoker   . Smokeless tobacco: Never Used  . Alcohol Use: No  . Drug Use: No  . Sexual Activity: Not Asked   Other  Topics Concern  . None   Social History Narrative   Patient is right handed.   Patient drinks caffeine occasionally.   Past Surgical History  Procedure Laterality Date  . Neck surgery  2002  . Foot surgery      lt  . Cystoscopy    . Abdominal adhesion surgery    . Colonoscopy    . Cholecystectomy  1985  . Tonsillectomy  1976  . Appendectomy  1993  . Abdominal hysterectomy  1995  . Colonoscopy    . Lithotripsy    . Lymph node biopsy Right 03/06/2014    Procedure: right groin LYMPH NODE BIOPSY;  Surgeon: Merrie Roof, MD;  Location: [facility];  Service: General;  Laterality: Right;   Past Medical History  Diagnosis Date  . Myalgia and myositis, unspecified   . Fibromyalgia   . Anxiety   . Depression   . Cervical facet syndrome   . Calcifying tendinitis of shoulder   . Carpal tunnel syndrome   . Thoracic radiculopathy   . Headache(784.0)   . Hypertension   . Bipolar affective ([place])   . Gout   . Asthma   . Renal calculi   . Diverticulosis   . PTSD (post-traumatic stress disorder)   . Full dentures   . Wears glasses   . Falls    BP 131/72 mmHg  Pulse 109  SpO2 94%  Opioid Risk Score:   Fall Risk Score:  `1  Depression screen PHQ 2/9  Depression screen PHQ 2/9 06/25/2015  Decreased Interest 2  Down, Depressed, Hopeless 2  PHQ - 2 Score 4      Review of Systems  Constitutional: Positive for unexpected weight change.  Gastrointestinal: Positive for nausea, vomiting, abdominal pain and diarrhea.  Genitourinary:       Bladder control problems  Musculoskeletal: Positive for gait problem.       Spasms  Neurological: Positive for dizziness and weakness.  Psychiatric/Behavioral: Positive for confusion. The patient is nervous/anxious.        Depression  All other systems reviewed and are negative.      Objective:   Physical Exam  Constitutional: She is oriented to person, place, and time. She appears well-developed and well-nourished.  She is wearing a foam cervical collar.  HENT:  Head: Normocephalic and atraumatic.  Eyes: Conjunctivae and EOM are normal. Pupils are equal, round, and reactive to light.  Neck: Neck supple.  Cardiovascular: Normal rate and regular rhythm.  Pulmonary/Chest: Effort normal and breath sounds normal.  Abdominal: Soft. Bowel sounds are normal.  Musculoskeletal:  Posture rigid due to pain and cervical collar. Did not perform a myofascial exam. Sensation intact. EPB/APL less tendr. She has pain around lateral elbow along common extensor tendon. Has TP's in the SCM's, traps, and splenius capitis, and right lower lumbar spine.  Neurological: She is alert and oriented to person, place, and time. She has normal strength with some pain inhibition weakness in the RUE. Had difficulty tracking to the right and left---appeared to have nystagmus but closed her eyes to decreased the discomfort.  Gait pattern is stable. Balance improved  Psychiatric: Her speech is normal and behavior is normal. Judgment normal. She is very calm and collected today. In good spirits.   Assessment & Plan:   ASSESSMENT:  1. History of fibromyalgia with myofascial pain and multiple trigger points.  2. Chronic migraine headaches.  3. Lumbar degenerative disk disease, L4-5  4. History of nephrolithiasis. Cysts on left kidney  5. Right flexor pollicis longus tendonitis  6. Right CTS  7. DeQuervain's Tenosynovitis  8. Fall with concussion and PCS including increased headaches and BPPV--symptoms appear better.  9. Left lateral epicondylitis    PLAN:  1.Left lateral epicondylitis---stretches were provided---left forearm band recommended. Can inject if indicated  2. After informed consent and preparation of the skin with isopropyl alcohol, I injected the right and left lumbar paraspinals (x2), left upper and mid trap (x2)--once again. ---four total injections. The patient tolerated well, and no complications  were experienced. Post-injection instructions were provided.  3. Continue with HEP  5. Consider botox for migraine headaches at some point in the near future.  6. I refilled her Norco 5/325 at #60 which she had taken a few more of because of pain. Also refilled duragesic patch  5. She will follow up with our NP in about one month. 30 minutes of face to face patient care time were spent during this visit. All questions were encouraged and answered.

## 2015-09-06 NOTE — Patient Instructions (Addendum)
Lateral Epicondylitis With Rehab Lateral epicondylitis involves inflammation and pain around the outer portion of the elbow. The pain is caused by inflammation of the tendons in the forearm that bring back (extend) the wrist. Lateral epicondylitis is also called tennis elbow, because it is very common in tennis players. However, it may occur in any individual who extends the wrist repetitively. If lateral epicondylitis is left untreated, it may become a chronic problem. SYMPTOMS   Pain, tenderness, and inflammation on the outer (lateral) side of the elbow.  Pain or weakness with gripping activities.  Pain that increases with wrist-twisting motions (playing tennis, using a screwdriver, opening a door or a jar).  Pain with lifting objects, including a coffee cup. CAUSES  Lateral epicondylitis is caused by inflammation of the tendons that extend the wrist. Causes of injury may include:  Repetitive stress and strain on the muscles and tendons that extend the wrist.  Sudden change in activity level or intensity.  Incorrect grip in racquet sports.  Incorrect grip size of racquet (often too large).  Incorrect hitting position or technique (usually backhand, leading with the elbow).  Using a racket that is too heavy. RISK INCREASES WITH:  Sports or occupations that require repetitive and/or strenuous forearm and wrist movements (tennis, squash, racquetball, carpentry).  Poor wrist and forearm strength and flexibility.  Failure to warm up properly before activity.  Resuming activity before healing, rehabilitation, and conditioning are complete. PREVENTION   Warm up and stretch properly before activity.  Maintain physical fitness:  Strength, flexibility, and endurance.  Cardiovascular fitness.  Wear and use properly fitted equipment.  Learn and use proper technique and have a coach correct improper technique.  Wear a tennis elbow (counterforce) brace. PROGNOSIS  The course  of this condition depends on the degree of the injury. If treated properly, acute cases (symptoms lasting less than 4 weeks) are often resolved in 2 to 6 weeks. Chronic (longer lasting cases) often resolve in 3 to 6 months but may require physical therapy. RELATED COMPLICATIONS   Frequently recurring symptoms, resulting in a chronic problem. Properly treating the problem the first time decreases frequency of recurrence.  Chronic inflammation, scarring tendon degeneration, and partial tendon tear, requiring surgery.  Delayed healing or resolution of symptoms. TREATMENT  Treatment first involves the use of ice and medicine to reduce pain and inflammation. Strengthening and stretching exercises may help reduce discomfort if performed regularly. These exercises may be performed at home if the condition is an acute injury. Chronic cases may require a referral to a physical therapist for evaluation and treatment. Your caregiver may advise a corticosteroid injection to help reduce inflammation. Rarely, surgery is needed. MEDICATION  If pain medicine is needed, nonsteroidal anti-inflammatory medicines (aspirin and ibuprofen), or other minor pain relievers (acetaminophen), are often advised.  Do not take pain medicine for 7 days before surgery.  Prescription pain relievers may be given, if your caregiver thinks they are needed. Use only as directed and only as much as you need.  Corticosteroid injections may be recommended. These injections should be reserved only for the most severe cases, because they can only be given a certain number of times. HEAT AND COLD  Cold treatment (icing) should be applied for 10 to 15 minutes every 2 to 3 hours for inflammation and pain, and immediately after activity that aggravates your symptoms. Use ice packs or an ice massage.  Heat treatment may be used before performing stretching and strengthening activities prescribed by your caregiver, physical  therapist, or  athletic trainer. Use a heat pack or a warm water soak. SEEK MEDICAL CARE IF: Symptoms get worse or do not improve in 2 weeks, despite treatment. EXERCISES  RANGE OF MOTION (ROM) AND STRETCHING EXERCISES - Epicondylitis, Lateral (Tennis Elbow) These exercises may help you when beginning to rehabilitate your injury. Your symptoms may go away with or without further involvement from your physician, physical therapist, or athletic trainer. While completing these exercises, remember:   Restoring tissue flexibility helps normal motion to return to the joints. This allows healthier, less painful movement and activity.  An effective stretch should be held for at least 30 seconds.  A stretch should never be painful. You should only feel a gentle lengthening or release in the stretched tissue. RANGE OF MOTION - Wrist Flexion, Active-Assisted  Extend your right / left elbow with your fingers pointing down.*  Gently pull the back of your hand towards you, until you feel a gentle stretch on the top of your forearm.  Hold this position for __________ seconds. Repeat __________ times. Complete this exercise __________ times per day.  *If directed by your physician, physical therapist or athletic trainer, complete this stretch with your elbow bent, rather than extended. RANGE OF MOTION - Wrist Extension, Active-Assisted  Extend your right / left elbow and turn your palm upwards.*  Gently pull your palm and fingertips back, so your wrist extends and your fingers point more toward the ground.  You should feel a gentle stretch on the inside of your forearm.  Hold this position for __________ seconds. Repeat __________ times. Complete this exercise __________ times per day. *If directed by your physician, physical therapist or athletic trainer, complete this stretch with your elbow bent, rather than extended. STRETCH - Wrist Flexion  Place the back of your right / left hand on a tabletop, leaving your  elbow slightly bent. Your fingers should point away from your body.  Gently press the back of your hand down onto the table by straightening your elbow. You should feel a stretch on the top of your forearm.  Hold this position for __________ seconds. Repeat __________ times. Complete this stretch __________ times per day.  STRETCH - Wrist Extension   Place your right / left fingertips on a tabletop, leaving your elbow slightly bent. Your fingers should point backwards.  Gently press your fingers and palm down onto the table by straightening your elbow. You should feel a stretch on the inside of your forearm.  Hold this position for __________ seconds. Repeat __________ times. Complete this stretch __________ times per day.  STRENGTHENING EXERCISES - Epicondylitis, Lateral (Tennis Elbow) These exercises may help you when beginning to rehabilitate your injury. They may resolve your symptoms with or without further involvement from your physician, physical therapist, or athletic trainer. While completing these exercises, remember:   Muscles can gain both the endurance and the strength needed for everyday activities through controlled exercises.  Complete these exercises as instructed by your physician, physical therapist or athletic trainer. Increase the resistance and repetitions only as guided.  You may experience muscle soreness or fatigue, but the pain or discomfort you are trying to eliminate should never worsen during these exercises. If this pain does get worse, stop and make sure you are following the directions exactly. If the pain is still present after adjustments, discontinue the exercise until you can discuss the trouble with your caregiver. STRENGTH - Wrist Flexors  Sit with your right / left forearm palm-up and fully  supported on a table or countertop. Your elbow should be resting below the height of your shoulder. Allow your wrist to extend over the edge of the  surface.  Loosely holding a __________ weight, or a piece of rubber exercise band or tubing, slowly curl your hand up toward your forearm.  Hold this position for __________ seconds. Slowly lower the wrist back to the starting position in a controlled manner. Repeat __________ times. Complete this exercise __________ times per day.  STRENGTH - Wrist Extensors  Sit with your right / left forearm palm-down and fully supported on a table or countertop. Your elbow should be resting below the height of your shoulder. Allow your wrist to extend over the edge of the surface.  Loosely holding a __________ weight, or a piece of rubber exercise band or tubing, slowly curl your hand up toward your forearm.  Hold this position for __________ seconds. Slowly lower the wrist back to the starting position in a controlled manner. Repeat __________ times. Complete this exercise __________ times per day.  STRENGTH - Ulnar Deviators  Stand with a ____________________ weight in your right / left hand, or sit while holding a rubber exercise band or tubing, with your healthy arm supported on a table or countertop.  Move your wrist, so that your pinkie travels toward your forearm and your thumb moves away from your forearm.  Hold this position for __________ seconds and then slowly lower the wrist back to the starting position. Repeat __________ times. Complete this exercise __________ times per day STRENGTH - Radial Deviators  Stand with a ____________________ weight in your right / left hand, or sit while holding a rubber exercise band or tubing, with your injured arm supported on a table or countertop.  Raise your hand upward in front of you or pull up on the rubber tubing.  Hold this position for __________ seconds and then slowly lower the wrist back to the starting position. Repeat __________ times. Complete this exercise __________ times per day. STRENGTH - Forearm Supinators   Sit with your right /  left forearm supported on a table, keeping your elbow below shoulder height. Rest your hand over the edge, palm down.  Gently grip a hammer or a soup ladle.  Without moving your elbow, slowly turn your palm and hand upward to a "thumbs-up" position.  Hold this position for __________ seconds. Slowly return to the starting position. Repeat __________ times. Complete this exercise __________ times per day.  STRENGTH - Forearm Pronators   Sit with your right / left forearm supported on a table, keeping your elbow below shoulder height. Rest your hand over the edge, palm up.  Gently grip a hammer or a soup ladle.  Without moving your elbow, slowly turn your palm and hand upward to a "thumbs-up" position.  Hold this position for __________ seconds. Slowly return to the starting position. Repeat __________ times. Complete this exercise __________ times per day.  STRENGTH - Grip  Grasp a tennis ball, a dense sponge, or a large, rolled sock in your hand.  Squeeze as hard as you can, without increasing any pain.  Hold this position for __________ seconds. Release your grip slowly. Repeat __________ times. Complete this exercise __________ times per day.  STRENGTH - Elbow Extensors, Isometric  Stand or sit upright, on a firm surface. Place your right / left arm so that your palm faces your stomach, and it is at the height of your waist.  Place your opposite hand on the  underside of your forearm. Gently push up as your right / left arm resists. Push as hard as you can with both arms, without causing any pain or movement at your right / left elbow. Hold this stationary position for __________ seconds. Gradually release the tension in both arms. Allow your muscles to relax completely before repeating.   This information is not intended to replace advice given to you by your health care provider. Make sure you discuss any questions you have with your health care provider.   Document Released:  09/18/2005 Document Revised: 10/09/2014 Document Reviewed: 12/31/2008 Elsevier Interactive Patient Education 2016 [place] ME WITH ANY PROBLEMS OR QUESTIONS [phone]). HAVE A HAPPY HOLIDAY SEASON!!!

## 2015-09-09 ENCOUNTER — Encounter: Payer: Self-pay | Admitting: Podiatry

## 2015-09-09 ENCOUNTER — Ambulatory Visit (INDEPENDENT_AMBULATORY_CARE_PROVIDER_SITE_OTHER): Payer: 59 | Admitting: Podiatry

## 2015-09-09 VITALS — BP 120/76 | HR 96 | Resp 16

## 2015-09-09 DIAGNOSIS — Q828 Other specified congenital malformations of skin: Secondary | ICD-10-CM | POA: Diagnosis not present

## 2015-09-09 DIAGNOSIS — L600 Ingrowing nail: Secondary | ICD-10-CM | POA: Diagnosis not present

## 2015-09-09 NOTE — Patient Instructions (Signed)

## 2015-09-12 NOTE — Progress Notes (Signed)
Subjective:     Patient ID: Patty Salas, female   DOB: 21-Oct-1955, 59 y.o.   MRN: [id number]  HPI patient states that I need to have this nail removed all my left and the lesions on both my feet are sore   Review of Systems     Objective:   Physical Exam Neurovascular status is intact with good digital perfusion and found to have severely thickened hallux nail left it's painful when pressed and keratotic lesion sub-metatarsals of both feet    Assessment:     Damaged hallux nail left with porokeratotic lesions bilateral    Plan:     Reviewed both conditions and recommended nail removal left and explained procedure to patient and risk. Patient wants surgery and today I infiltrated 60 mg Xylocaine Marcaine mixture I removed the hallux nail exposed matrix and applied phenol 5 applications 30 seconds followed by alcohol lavage and sterile dressing. Gave instructions on soaks and debrided lesions on both feet with no iatrogenic bleeding noted and reappoint for recheck

## 2015-09-20 ENCOUNTER — Other Ambulatory Visit: Payer: Self-pay

## 2015-09-20 DIAGNOSIS — Z1231 Encounter for screening mammogram for malignant neoplasm of breast: Secondary | ICD-10-CM

## 2015-10-05 ENCOUNTER — Encounter: Payer: 59 | Admitting: Registered Nurse

## 2015-10-07 ENCOUNTER — Encounter: Payer: Self-pay | Admitting: Registered Nurse

## 2015-10-07 ENCOUNTER — Encounter: Payer: 59 | Attending: Physical Medicine and Rehabilitation | Admitting: Registered Nurse

## 2015-10-07 VITALS — BP 135/62 | HR 100

## 2015-10-07 DIAGNOSIS — G894 Chronic pain syndrome: Secondary | ICD-10-CM | POA: Diagnosis present

## 2015-10-07 DIAGNOSIS — Z5181 Encounter for therapeutic drug level monitoring: Secondary | ICD-10-CM | POA: Insufficient documentation

## 2015-10-07 DIAGNOSIS — M5136 Other intervertebral disc degeneration, lumbar region: Secondary | ICD-10-CM

## 2015-10-07 DIAGNOSIS — M4716 Other spondylosis with myelopathy, lumbar region: Secondary | ICD-10-CM | POA: Diagnosis present

## 2015-10-07 DIAGNOSIS — F0781 Postconcussional syndrome: Secondary | ICD-10-CM | POA: Insufficient documentation

## 2015-10-07 DIAGNOSIS — Z79899 Other long term (current) drug therapy: Secondary | ICD-10-CM | POA: Diagnosis present

## 2015-10-07 DIAGNOSIS — G43519 Persistent migraine aura without cerebral infarction, intractable, without status migrainosus: Secondary | ICD-10-CM | POA: Insufficient documentation

## 2015-10-07 DIAGNOSIS — M797 Fibromyalgia: Secondary | ICD-10-CM | POA: Insufficient documentation

## 2015-10-07 MED ORDER — FENTANYL 25 MCG/HR TD PT72: 25.0000 ug | MEDICATED_PATCH | TRANSDERMAL | Status: DC

## 2015-10-07 MED ORDER — HYDROCODONE-ACETAMINOPHEN 5-325 MG PO TABS: 1.0000 | ORAL_TABLET | Freq: Four times a day (QID) | ORAL | Status: DC | PRN

## 2015-10-07 NOTE — Progress Notes (Signed)
Subjective:    Patient ID: Patty Salas, female    DOB: May 14, 1956, 60 y.o.   MRN: [id number]  HPI: Patty Salas is a 60 year old female who returns for follow up for chronic pain and medication refill. She says her pain is located in her neck and lower back mainly left side. She rates her pain 10. Her current exercise regime is performing stretching exercises with bands and walking. Patty Salas has 50 tablets of Hydrocodone new script post dated she verbalizes understanding. S/P Trigger Point injections with good relief noted. Also states " she's getting over the flu", she had a fever a few days ago, denies fever today. Also states she will F/U with her PCP.  Pain Inventory Average Pain 10 Pain Right Now 10 My pain is constant, sharp, burning, stabbing and aching  In the last 24 hours, has pain interfered with the following? General activity 10 Relation with others 10 Enjoyment of life 10 What TIME of day is your pain at its worst? all Sleep (in general) Fair  Pain is worse with: walking, bending, sitting, inactivity, standing and some activites Pain improves with: rest, heat/ice, therapy/exercise and medication Relief from Meds: 4  Mobility walk without assistance walk with assistance use a cane how many minutes can you walk? 5 ability to climb steps?  yes do you drive?  yes  Function disabled: date disabled . I need assistance with the following:  meal prep, household duties and shopping  Neuro/Psych bladder control problems bowel control problems weakness numbness tremor tingling trouble walking spasms dizziness confusion depression anxiety  Prior Studies Any changes since last visit?  no  Physicians involved in your care Any changes since last visit?  no   Family History  Problem Relation Age of Onset  . Cancer Mother   . Migraines Mother   . Heart disease Father   . Hypertension Father   . Cancer Father     colon  . Hypertension  Brother   . Migraines Brother   . Hypertension Brother   . Migraines Brother   . Migraines Daughter    Social History   Social History  . Marital Status: Married    Spouse Name: N/A  . Number of Children: 1  . Years of Education: COLLEGE1   Occupational History  . HOUSEWIFE    Social History Main Topics  . Smoking status: Never Smoker   . Smokeless tobacco: Never Used  . Alcohol Use: No  . Drug Use: No  . Sexual Activity: Not Asked   Other Topics Concern  . None   Social History Narrative   Patient is right handed.   Patient drinks caffeine occasionally.   Past Surgical History  Procedure Laterality Date  . Neck surgery  2002  . Foot surgery      lt  . Cystoscopy    . Abdominal adhesion surgery    . Colonoscopy    . Cholecystectomy  1985  . Tonsillectomy  1976  . Appendectomy  1993  . Abdominal hysterectomy  1995  . Colonoscopy    . Lithotripsy    . Lymph node biopsy Right 03/06/2014    Procedure: right groin LYMPH NODE BIOPSY;  Surgeon: Merrie Roof, MD;  Location: [facility];  Service: General;  Laterality: Right;   Past Medical History  Diagnosis Date  . Myalgia and myositis, unspecified   . Fibromyalgia   . Anxiety   . Depression   .  Cervical facet syndrome   . Calcifying tendinitis of shoulder   . Carpal tunnel syndrome   . Thoracic radiculopathy   . Headache(784.0)   . Hypertension   . Bipolar affective ([place])   . Gout   . Asthma   . Renal calculi   . Diverticulosis   . PTSD (post-traumatic stress disorder)   . Full dentures   . Wears glasses   . Falls    BP 141/62 mmHg  Pulse 105  SpO2 97%  Opioid Risk Score:   Fall Risk Score:  `1  Depression screen PHQ 2/9  Depression screen PHQ 2/9 06/25/2015  Decreased Interest 2  Down, Depressed, Hopeless 2  PHQ - 2 Score 4     Review of Systems  Constitutional: Positive for fever, chills, diaphoresis and appetite change.  Gastrointestinal: Positive for nausea, vomiting,  abdominal pain, diarrhea and constipation.  All other systems reviewed and are negative.      Objective:   Physical Exam  Constitutional: She is oriented to person, place, and time. She appears well-developed and well-nourished.  HENT:  Head: Normocephalic and atraumatic.  Neck: Normal range of motion. Neck supple.  Cervical Paraspinal Tenderness: Mainly Left Side C-5- C-6  Cardiovascular: Normal rate and regular rhythm.   Pulmonary/Chest: Effort normal and breath sounds normal.  Musculoskeletal:  Normal Muscle Bulk and Muscle Testing Reveals: Upper Extremities: Full ROM and Muscle Strength 5/5 Bilateral Rhomboid Tenderness Lumbar Paraspinal Tenderness: L-3- L-5 Lower Extremities: Full ROM and Muscle Strength 5/5 Arises from chair with ease Narrow Based gait Using 4 Prong cane   Neurological: She is alert and oriented to person, place, and time.  Skin: Skin is warm and dry.  Psychiatric: She has a normal mood and affect.  Nursing note and vitals reviewed.         Assessment & Plan:  1. History of fibromyalgia with myofascial pain and multiple trigger points. Continue with Heat and exercise Regime. Continue with gabapentin.  2. Chronic migraine headaches.Continue to Monitor 3. Lumbar degenerative disk disease, L4-5 :  Refilled: Fentanyl Patch 25 mcg one patch every three days #10. Continue Hydrocodone 5/325 mg one tablet every 6 hours as needed #90. Script Post Dated 4. History of nephrolithiasis. Cysts on left kidney: Urology Following  5. 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  20 minutes of face to face patient care time was spent during this visit. All questions were encouraged and answered.   F/U in 1 month

## 2015-10-14 LAB — TOXASSURE SELECT,+ANTIDEPR,UR: PDF: 0

## 2015-10-19 ENCOUNTER — Ambulatory Visit: Payer: 59

## 2015-10-20 NOTE — Progress Notes (Signed)
Urine drug screen for this encounter is consistent for prescribed medication 

## 2015-10-21 ENCOUNTER — Ambulatory Visit (INDEPENDENT_AMBULATORY_CARE_PROVIDER_SITE_OTHER): Payer: 59 | Admitting: Podiatry

## 2015-10-21 ENCOUNTER — Encounter: Payer: Self-pay | Admitting: Podiatry

## 2015-10-21 DIAGNOSIS — L600 Ingrowing nail: Secondary | ICD-10-CM | POA: Diagnosis not present

## 2015-10-21 DIAGNOSIS — Q828 Other specified congenital malformations of skin: Secondary | ICD-10-CM

## 2015-10-21 NOTE — Progress Notes (Signed)
Subjective:     Patient ID: Patty Salas, female   DOB: 11-10-1955, 60 y.o.   MRN: [id number]  HPIpatient presents with lesions on both feet that are painful and also complaining about her nail on her left big toe that she wanted checked   Review of Systems     Objective:   Physical Exam Neurovascular status intact with area on the left hallux lateral side that's healing but slow and lesions plantar bilateral    Assessment:     Porokeratotic type lesions and slight irritation left hallux localized    Plan:     Advised on soaks for the left hallux and explain what to do if redness should occur and reappoint if it should start to become swollen and debris did lesions with no iatrogenic bleeding

## 2015-10-25 ENCOUNTER — Telehealth: Payer: Self-pay | Admitting: *Deleted

## 2015-10-25 NOTE — Telephone Encounter (Signed)
Pt says she spoke with you over the weekend.  She is passing kidney stones. She basically left a message stating that she has taken a couple of extra of her Norco's.    FYI

## 2015-10-25 NOTE — Telephone Encounter (Signed)
That is correct. i gave her permission

## 2015-11-03 ENCOUNTER — Encounter: Payer: Self-pay | Admitting: Physical Medicine & Rehabilitation

## 2015-11-03 ENCOUNTER — Encounter: Payer: 59 | Attending: Physical Medicine and Rehabilitation | Admitting: Physical Medicine & Rehabilitation

## 2015-11-03 VITALS — BP 146/49 | HR 105

## 2015-11-03 DIAGNOSIS — Z79899 Other long term (current) drug therapy: Secondary | ICD-10-CM | POA: Diagnosis present

## 2015-11-03 DIAGNOSIS — G894 Chronic pain syndrome: Secondary | ICD-10-CM | POA: Insufficient documentation

## 2015-11-03 DIAGNOSIS — M47812 Spondylosis without myelopathy or radiculopathy, cervical region: Secondary | ICD-10-CM | POA: Diagnosis not present

## 2015-11-03 DIAGNOSIS — M4716 Other spondylosis with myelopathy, lumbar region: Secondary | ICD-10-CM | POA: Diagnosis present

## 2015-11-03 DIAGNOSIS — Z5181 Encounter for therapeutic drug level monitoring: Secondary | ICD-10-CM | POA: Diagnosis present

## 2015-11-03 DIAGNOSIS — G43519 Persistent migraine aura without cerebral infarction, intractable, without status migrainosus: Secondary | ICD-10-CM | POA: Diagnosis present

## 2015-11-03 DIAGNOSIS — M25552 Pain in left hip: Secondary | ICD-10-CM

## 2015-11-03 DIAGNOSIS — M47816 Spondylosis without myelopathy or radiculopathy, lumbar region: Secondary | ICD-10-CM | POA: Diagnosis not present

## 2015-11-03 DIAGNOSIS — M797 Fibromyalgia: Secondary | ICD-10-CM

## 2015-11-03 DIAGNOSIS — M5136 Other intervertebral disc degeneration, lumbar region: Secondary | ICD-10-CM | POA: Insufficient documentation

## 2015-11-03 DIAGNOSIS — F0781 Postconcussional syndrome: Secondary | ICD-10-CM | POA: Diagnosis present

## 2015-11-03 DIAGNOSIS — N200 Calculus of kidney: Secondary | ICD-10-CM

## 2015-11-03 MED ORDER — FENTANYL 25 MCG/HR TD PT72: 25.0000 ug | MEDICATED_PATCH | TRANSDERMAL | Status: DC

## 2015-11-03 NOTE — Patient Instructions (Signed)
  PLEASE CALL ME WITH ANY PROBLEMS OR QUESTIONS (#[phone]).      

## 2015-11-03 NOTE — Progress Notes (Signed)
Subjective:    Patient ID: Patty Salas, female    DOB: 1956-05-25, 60 y.o.   MRN: [id number]  HPI   Castiel is here in follow up of her chronic pain. She has been struggling with her kidney stones again which cause frequent flares in her pain. She has urological follow up on Friday.  She is consciously trying to use less hydrocodone.   She had one fall since I last saw her.    Pain Inventory Average Pain 12+ Pain Right Now 15+ My pain is intermittent, constant, sharp, burning, stabbing and aching  In the last 24 hours, has pain interfered with the following? General activity 10 Relation with others 10 Enjoyment of life 10 What TIME of day is your pain at its worst? all Sleep (in general) Fair  Pain is worse with: walking, bending, sitting, standing and some activites Pain improves with: rest, heat/ice, therapy/exercise, medication and injections Relief from Meds: 4  Mobility walk without assistance walk with assistance use a cane use a walker how many minutes can you walk? 5 ability to climb steps?  no do you drive?  yes needs help with transfers transfers alone Do you have any goals in this area?  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 disabled: date disabled . I need assistance with the following:  meal prep, household duties and shopping Do you have any goals in this area?  yes  Neuro/Psych bladder control problems bowel control problems weakness numbness tremor tingling trouble walking spasms dizziness confusion depression anxiety  Prior Studies Any changes since last visit?  no  Physicians involved in your care Any changes since last visit?  no   Family History  Problem Relation Age of Onset  . Cancer Mother   . Migraines Mother   . Heart disease Father   . Hypertension Father   . Cancer Father     colon  . Hypertension Brother   . Migraines Brother   . Hypertension Brother   . Migraines  Brother   . Migraines Daughter    Social History   Social History  . Marital Status: Married    Spouse Name: N/A  . Number of Children: 1  . Years of Education: COLLEGE1   Occupational History  . HOUSEWIFE    Social History Main Topics  . Smoking status: Never Smoker   . Smokeless tobacco: Never Used  . Alcohol Use: No  . Drug Use: No  . Sexual Activity: Not Asked   Other Topics Concern  . None   Social History Narrative   Patient is right handed.   Patient drinks caffeine occasionally.   Past Surgical History  Procedure Laterality Date  . Neck surgery  2002  . Foot surgery      lt  . Cystoscopy    . Abdominal adhesion surgery    . Colonoscopy    . Cholecystectomy  1985  . Tonsillectomy  1976  . Appendectomy  1993  . Abdominal hysterectomy  1995  . Colonoscopy    . Lithotripsy    . Lymph node biopsy Right 03/06/2014    Procedure: right groin LYMPH NODE BIOPSY;  Surgeon: Merrie Roof, MD;  Location: [facility];  Service: General;  Laterality: Right;   Past Medical History  Diagnosis Date  . Myalgia and myositis, unspecified   . Fibromyalgia   . Anxiety   . Depression   . Cervical facet syndrome   .  Calcifying tendinitis of shoulder   . Carpal tunnel syndrome   . Thoracic radiculopathy   . Headache(784.0)   . Hypertension   . Bipolar affective ([place])   . Gout   . Asthma   . Renal calculi   . Diverticulosis   . PTSD (post-traumatic stress disorder)   . Full dentures   . Wears glasses   . Falls    BP 146/49 mmHg  Pulse 105  SpO2 95%  Opioid Risk Score:   Fall Risk Score:  `1  Depression screen PHQ 2/9  Depression screen PHQ 2/9 06/25/2015  Decreased Interest 2  Down, Depressed, Hopeless 2  PHQ - 2 Score 4     Review of Systems  Constitutional: Positive for fever, chills, diaphoresis, appetite change and unexpected weight change.  Gastrointestinal: Positive for nausea, vomiting, abdominal pain, diarrhea and constipation.    All other systems reviewed and are negative.      Objective:   Physical Exam  Constitutional: She is oriented to person, place, and time. She appears well-developed and well-nourished. She is wearing a foam cervical collar.  HENT:  Head: Normocephalic and atraumatic.  Eyes: Conjunctivae and EOM are normal. Pupils are equal, round, and reactive to light.  Neck: Neck supple.  Cardiovascular: Normal rate and regular rhythm.  Pulmonary/Chest: Effort normal and breath sounds normal.  Abdominal: Soft. Bowel sounds are normal.  Musculoskeletal:  Left hip tender with flexion and rotation. FABER + on left with pain in groin. Sensation intact. EPB/APL non tendr. She has pain around lateral elbow along common extensor tendon. Has TP's in the SCM's, traps, and splenius capitis, and right  And left lower lumbar spine.  Neurological: She is alert and oriented to person, place, and time. She has normal strength with some pain inhibition weakness in the RUE. Had difficulty tracking to the right and left---appeared to have nystagmus but closed her eyes to decreased the discomfort.  Gait pattern is stable. Balance stable. Psychiatric: Her speech is normal and behavior is normal. Judgment normal. She is very calm and collected today. In good spirits.     Assessment & Plan:   ASSESSMENT:  1. History of fibromyalgia with myofascial pain and multiple trigger points.  2. Chronic migraine headaches.  3. Lumbar degenerative disk disease, L4-5  4. History of nephrolithiasis. Cysts on left kidney  5. Right flexor pollicis longus tendonitis  6. Right CTS  7. DeQuervain's Tenosynovitis  8. Fall with concussion and PCS including increased headaches and BPPV--symptoms appear better.  9. Left lateral epicondylitis 10. Left hip pain. ?OA vs overlap from #4    PLAN:  1.Xrays of left hip to rule out degenerative disease.  2. After informed consent and preparation of the skin with isopropyl alcohol, I injected  the right and left lumbar paraspinals (x2), left upper and mid trap (x2)--once again. ---four total injections. The patient tolerated well, and no complications were experienced. Post-injection instructions were provided.  3. Continue with HEP  4. Botox migraine is still a consideration in the future.  6. Continue Norco 5/325 which she is using less of. Did not need to be refilled this month. She has only used about 24 pills since 10/07/15. Did refill duragesic patch  5. She will follow up with our NP in about one month. 30 minutes of face to face patient care time were spent during this visit. All questions were encouraged and answered.

## 2015-11-09 ENCOUNTER — Telehealth: Payer: Self-pay | Admitting: Physical Medicine & Rehabilitation

## 2015-11-09 ENCOUNTER — Ambulatory Visit (HOSPITAL_COMMUNITY)
Admission: RE | Admit: 2015-11-09 | Discharge: 2015-11-09 | Disposition: A | Discharge status: Home / Self Care | Payer: 59 | Source: Ambulatory Visit | Attending: Physical Medicine & Rehabilitation | Admitting: Physical Medicine & Rehabilitation

## 2015-11-09 DIAGNOSIS — M4716 Other spondylosis with myelopathy, lumbar region: Secondary | ICD-10-CM

## 2015-11-09 DIAGNOSIS — M25552 Pain in left hip: Secondary | ICD-10-CM

## 2015-11-09 DIAGNOSIS — M5136 Other intervertebral disc degeneration, lumbar region: Secondary | ICD-10-CM

## 2015-11-09 MED ORDER — HYDROCODONE-ACETAMINOPHEN 5-325 MG PO TABS: 1.0000 | ORAL_TABLET | Freq: Four times a day (QID) | ORAL | Status: DC | PRN

## 2015-11-09 NOTE — Telephone Encounter (Signed)
Printed for [employer] to sign.  She did not get refill at her last appt.

## 2015-11-09 NOTE — Telephone Encounter (Signed)
Patient presented in the office requesting refill of Norco 5/325, Patient has appointment [facility] indicating she has kidney stone that is going to have to be removed.

## 2015-11-10 ENCOUNTER — Telehealth: Payer: Self-pay | Admitting: Physical Medicine & Rehabilitation

## 2015-11-10 NOTE — Telephone Encounter (Signed)
Let Nicolette know her hip xray was completely normal. Thanks!!

## 2015-11-10 NOTE — Telephone Encounter (Signed)
Pt advised.

## 2015-11-15 ENCOUNTER — Other Ambulatory Visit: Payer: Self-pay | Admitting: Urology

## 2015-11-15 DIAGNOSIS — D4102 Neoplasm of uncertain behavior of left kidney: Secondary | ICD-10-CM

## 2015-12-01 ENCOUNTER — Encounter: Payer: 59 | Attending: Physical Medicine and Rehabilitation | Admitting: Physical Medicine & Rehabilitation

## 2015-12-01 ENCOUNTER — Encounter: Payer: Self-pay | Admitting: Physical Medicine & Rehabilitation

## 2015-12-01 VITALS — BP 134/48 | HR 101

## 2015-12-01 DIAGNOSIS — F0781 Postconcussional syndrome: Secondary | ICD-10-CM | POA: Insufficient documentation

## 2015-12-01 DIAGNOSIS — M797 Fibromyalgia: Secondary | ICD-10-CM

## 2015-12-01 DIAGNOSIS — Z79899 Other long term (current) drug therapy: Secondary | ICD-10-CM | POA: Insufficient documentation

## 2015-12-01 DIAGNOSIS — M47896 Other spondylosis, lumbar region: Secondary | ICD-10-CM

## 2015-12-01 DIAGNOSIS — M5136 Other intervertebral disc degeneration, lumbar region: Secondary | ICD-10-CM

## 2015-12-01 DIAGNOSIS — M47812 Spondylosis without myelopathy or radiculopathy, cervical region: Secondary | ICD-10-CM | POA: Diagnosis not present

## 2015-12-01 DIAGNOSIS — M51369 Other intervertebral disc degeneration, lumbar region without mention of lumbar back pain or lower extremity pain: Secondary | ICD-10-CM

## 2015-12-01 DIAGNOSIS — M4716 Other spondylosis with myelopathy, lumbar region: Secondary | ICD-10-CM

## 2015-12-01 DIAGNOSIS — Z5181 Encounter for therapeutic drug level monitoring: Secondary | ICD-10-CM | POA: Insufficient documentation

## 2015-12-01 DIAGNOSIS — G894 Chronic pain syndrome: Secondary | ICD-10-CM | POA: Insufficient documentation

## 2015-12-01 DIAGNOSIS — G43519 Persistent migraine aura without cerebral infarction, intractable, without status migrainosus: Secondary | ICD-10-CM | POA: Diagnosis present

## 2015-12-01 MED ORDER — FENTANYL 25 MCG/HR TD PT72: 25.0000 ug | MEDICATED_PATCH | TRANSDERMAL | Status: DC

## 2015-12-01 MED ORDER — FENTANYL 25 MCG/HR TD PT72
25.0000 ug | MEDICATED_PATCH | TRANSDERMAL | Status: DC
Start: 2015-12-01 — End: 2015-12-01

## 2015-12-01 NOTE — Progress Notes (Signed)
Subjective:    Patient ID: Patty Salas, female    DOB: 1956/07/03, 60 y.o.   MRN: [id number]  HPI  Patty Salas is here in follow up of her chronic pain. She has been doing fairly well. Patty Salas has full RX of hydrocodone so will not need new today. She is using less and less of the hydrocodone. She stays active with stretching and exercising.    She has an MRI scheduled tomorrow morning of her kidneys to follow up lesions seen on ultrasound. Urology is following her.   I reviewed her left hip xr which was essentially normal. Her back pain has been stable but can wax and wane depending upon the amount of activity she participates in.  Pain Inventory Average Pain 9 Pain Right Now 10 My pain is constant, sharp, burning, stabbing and aching  In the last 24 hours, has pain interfered with the following? General activity 9 Relation with others 9 Enjoyment of life 9 What TIME of day is your pain at its worst? all Sleep (in general) Fair  Pain is worse with: walking, bending, sitting, inactivity, standing and some activites Pain improves with: rest, heat/ice, therapy/exercise, medication and injections Relief from Meds: 4  Mobility walk without assistance how many minutes can you walk? 5 ability to climb steps?  yes do you drive?  yes  Function I need assistance with the following:  dressing, toileting, meal prep, household duties and shopping  Neuro/Psych bladder control problems bowel control problems weakness numbness tremor tingling trouble walking spasms dizziness confusion depression anxiety loss of taste or smell  Prior Studies Any changes since last visit?  yes  Going to have another MRI tomorrow for lymphadenopathy  Physicians involved in your care Any changes since last visit?  no   Family History  Problem Relation Age of Onset  . Cancer Mother   . Migraines Mother   . Heart disease Father   . Hypertension Father   . Cancer Father     colon  .  Hypertension Brother   . Migraines Brother   . Hypertension Brother   . Migraines Brother   . Migraines Daughter    Social History   Social History  . Marital Status: Married    Spouse Name: N/A  . Number of Children: 1  . Years of Education: COLLEGE1   Occupational History  . HOUSEWIFE    Social History Main Topics  . Smoking status: Never Smoker   . Smokeless tobacco: Never Used  . Alcohol Use: No  . Drug Use: No  . Sexual Activity: Not Asked   Other Topics Concern  . None   Social History Narrative   Patient is right handed.   Patient drinks caffeine occasionally.   Past Surgical History  Procedure Laterality Date  . Neck surgery  2002  . Foot surgery      lt  . Cystoscopy    . Abdominal adhesion surgery    . Colonoscopy    . Cholecystectomy  1985  . Tonsillectomy  1976  . Appendectomy  1993  . Abdominal hysterectomy  1995  . Colonoscopy    . Lithotripsy    . Lymph node biopsy Right 03/06/2014    Procedure: right groin LYMPH NODE BIOPSY;  Surgeon: Merrie Roof, MD;  Location: [facility];  Service: General;  Laterality: Right;   Past Medical History  Diagnosis Date  . Myalgia and myositis, unspecified   . Fibromyalgia   . Anxiety   .  Depression   . Cervical facet syndrome   . Calcifying tendinitis of shoulder   . Carpal tunnel syndrome   . Thoracic radiculopathy   . Headache(784.0)   . Hypertension   . Bipolar affective ([place])   . Gout   . Asthma   . Renal calculi   . Diverticulosis   . PTSD (post-traumatic stress disorder)   . Full dentures   . Wears glasses   . Falls   bp 134/48  p 101  o2   97% Opioid Risk Score:   Fall Risk Score:  `1  Depression screen PHQ 2/9  Depression screen [facility] 2/9 12/01/2015 06/25/2015  Decreased Interest 2 2  Down, Depressed, Hopeless 2 2  PHQ - 2 Score 4 4     Review of Systems  Constitutional: Positive for fever, chills, diaphoresis, appetite change and unexpected weight change.    Respiratory: Positive for cough and wheezing.   Cardiovascular: Positive for leg swelling.  Gastrointestinal: Positive for nausea, vomiting, abdominal pain, diarrhea and constipation.  All other systems reviewed and are negative.      Objective:   Physical Exam  Constitutional: She is oriented to person, place, and time. She appears well-developed and well-nourished. She is wearing a foam cervical collar.  HENT:  Head: Normocephalic and atraumatic.  Eyes: Conjunctivae and EOM are normal. Pupils are equal, round, and reactive to light.  Neck: Neck supple.  Cardiovascular: Normal rate and regular rhythm.  Pulmonary/Chest: Effort normal and breath sounds normal.  Abdominal: Soft. Bowel sounds are normal.  Musculoskeletal:  Left hip tender with flexion and rotation. FABER + on left with pain in groin. Sensation intact. EPB/APL non tendr. She has pain around lateral elbow along common extensor tendon. Has TP's in the SCM's, traps, and splenius capitis, and right And left lower lumbar spine.  Neurological: She is alert and oriented to person, place, and time. She has normal strength with some pain inhibition weakness in the RUE. Had difficulty tracking to the right and left---appeared to have nystagmus but closed her eyes to decreased the discomfort.  Gait pattern is stable. Balance stable. Psychiatric: Her speech is normal and behavior is normal. Judgment normal. She is very calm and collected today. In good spirits.     Assessment & Plan:   ASSESSMENT:  1. History of fibromyalgia with myofascial pain and multiple trigger points.  2. Chronic migraine headaches.  3. Lumbar degenerative disk disease, L4-5  4. History of nephrolithiasis. Cysts on left kidney  5. Right flexor pollicis longus tendonitis  6. Right CTS  7. DeQuervain's Tenosynovitis  8. Fall with concussion and PCS including increased headaches and BPPV--symptoms appear better.  9. Left lateral epicondylitis  10. Left hip  pain. ?OA vs kidney stones    PLAN:  1.Xrays of left hip to rule out degenerative disease.  2. After informed consent and preparation of the skin with isopropyl alcohol, I injected the right and left lumbar paraspinals (x2), left upper and mid trap (x2)--repeated same areas again today. ---four total injections. The patient tolerated well, and no complications were experienced. Post-injection instructions were provided.  3. Continue with HEP  4. Botox migraine is still a consideration in the future.  6. Continue Norco 5/325 which she is using less of. Did not need to be refilled this month. She has a norco script still unused.  Did refill duragesic patch x 2.  5. She will follow up with me or our NP in about 2 month. 30 minutes of  face to face patient care time were spent during this visit. All questions were encouraged and answered.

## 2015-12-01 NOTE — Patient Instructions (Signed)
CONTINUE WITH YOUR EXERCISES AND GOOD POSTURE AND STRETCHING    PLEASE CALL ME WITH ANY PROBLEMS OR QUESTIONS [phone]).

## 2015-12-02 ENCOUNTER — Ambulatory Visit (HOSPITAL_COMMUNITY)
Admission: RE | Admit: 2015-12-02 | Discharge: 2015-12-02 | Disposition: A | Discharge status: Home / Self Care | Payer: 59 | Source: Ambulatory Visit | Attending: Urology | Admitting: Urology

## 2015-12-02 ENCOUNTER — Telehealth: Payer: Self-pay | Admitting: *Deleted

## 2015-12-02 DIAGNOSIS — R599 Enlarged lymph nodes, unspecified: Secondary | ICD-10-CM | POA: Insufficient documentation

## 2015-12-02 DIAGNOSIS — D4102 Neoplasm of uncertain behavior of left kidney: Secondary | ICD-10-CM | POA: Diagnosis present

## 2015-12-02 DIAGNOSIS — N281 Cyst of kidney, acquired: Secondary | ICD-10-CM | POA: Diagnosis present

## 2015-12-02 LAB — POCT I-STAT CREATININE: Creatinine, Ser: 0.6 mg/dL (ref 0.44–1.00)

## 2015-12-02 MED ORDER — GADOBENATE DIMEGLUMINE 529 MG/ML IV SOLN
15.0000 mL | Freq: Once | INTRAVENOUS | Status: AC | PRN
Start: 2015-12-02 — End: 2015-12-02
  Administered 2015-12-02: 12 mL via INTRAVENOUS

## 2015-12-02 NOTE — Telephone Encounter (Signed)
Patty Salas called because she is having an MRI this morning and she just put a Fentanyl patch on and you cannot have any medical patches on during procedure.  She was asking if we had any starter kits that have one patch in it that we could give her.  I explained that we do not dispense narcotics out of our office and patches come 5 to a box and there is not a way to write for just one patch to replace this one.  She had 3 patches yesterday at her appt and she has Rx for the next 2 months and should not have a problem just replacing the patch when she gets home.  I reassured her I would notate in the chart that she has had to do this.

## 2015-12-02 NOTE — Telephone Encounter (Signed)
Ermel called an is tearful over MRI and wants Dr Naaman Plummer to take a look at it and give her a call.  She values Dr Naaman Plummer' opinion and wants him to weigh in on it. She knows there is a mass and the kidney is growing but I am not sure she was told other than the kidney might need to come out.

## 2015-12-03 ENCOUNTER — Telehealth: Payer: Self-pay | Admitting: Hematology and Oncology

## 2015-12-03 ENCOUNTER — Telehealth: Payer: Self-pay | Admitting: *Deleted

## 2015-12-03 NOTE — Telephone Encounter (Signed)
Spoke with [facility] and let them know they could fill RX

## 2015-12-03 NOTE — Telephone Encounter (Signed)
Pt states can't come in today to see Dr. Alvy Bimler.  She wants her husband to come w/ her and he can't make it today.  She will come on Monday at 10 am.

## 2015-12-03 NOTE — Telephone Encounter (Signed)
s.w. pt and advised on March appt....pt ok

## 2015-12-03 NOTE — Telephone Encounter (Signed)
Spoke with Patty Salas about scan. She is doing ok. Answered any questions she has. Fentanyl patch may be filled today.

## 2015-12-06 ENCOUNTER — Encounter: Payer: Self-pay | Admitting: Hematology and Oncology

## 2015-12-06 ENCOUNTER — Ambulatory Visit (HOSPITAL_BASED_OUTPATIENT_CLINIC_OR_DEPARTMENT_OTHER): Payer: 59 | Admitting: Hematology and Oncology

## 2015-12-06 VITALS — BP 139/52 | HR 94 | Temp 98.4°F | Resp 18 | Wt 136.2 lb

## 2015-12-06 DIAGNOSIS — N289 Disorder of kidney and ureter, unspecified: Secondary | ICD-10-CM

## 2015-12-06 DIAGNOSIS — R599 Enlarged lymph nodes, unspecified: Secondary | ICD-10-CM

## 2015-12-06 DIAGNOSIS — R591 Generalized enlarged lymph nodes: Secondary | ICD-10-CM

## 2015-12-06 DIAGNOSIS — N2889 Other specified disorders of kidney and ureter: Secondary | ICD-10-CM | POA: Insufficient documentation

## 2015-12-07 ENCOUNTER — Encounter: Payer: Self-pay | Admitting: Hematology and Oncology

## 2015-12-07 NOTE — Assessment & Plan Note (Signed)
Previously, I have discussed with her urologist extensively about the management of this patient. The patient has evidence of enlarging kidney mass, highly suspicious for renal cell carcinoma. The patient also had intermittent hematuria. She had evidence of chronic lymphadenopathy in the retroperitoneal area, suspicious for low-grade lymphoma, which is associated with risk of secondary malignancy such as primary kidney cancer Ultimately, we are in agreement for her to proceed with nephrectomy as recommended by her urologist. I intend to keep her appointment as scheduled in April to review final pathology result after her surgery

## 2015-12-07 NOTE — Progress Notes (Signed)
[place] OFFICE PROGRESS NOTE  Patient Care Team: [name], MD as PCP - General (Family Medicine) Richmond Campbell, MD as Consulting Physician (Gastroenterology)  SUMMARY OF ONCOLOGIC HISTORY:  The patient is seen  here because of nonspecific lymphadenopathy detected on recent CT scan. This patient have chronic kidney stone disease.  Ultrasound renal dated 02/14/2013 showed Probable complex cyst in the medial inferior left kidney. Recommend continued follow-up to assess stability since this is new compared to the CT from 2012. On 01/01/2014, she had CT scan without contrast for evaluation of kidney stones and was found to have multiple periaortic lymphadenopathy. The patient denies any recent infection. She does have history of diverticulosis and she thought she may have a bout of diverticulitis recently. PET CT scan dated 01/26/2014 show diffuse lymphadenopathy. On 03/06/2014, excisional lymph node biopsy is benign. She is observed. On 12/02/2015, MRI of the abdomen compound left renal mass, highly suspicious for renal cell carcinoma  INTERVAL HISTORY: Please see below for problem oriented charting. The patient had recent fall causing facial trauma. She continues to follow-up with physical medicine for chronic pain management. The patient had intermittent painful hematuria and was subsequently referred back to urologist for further evaluation and was noted to have complex renal mass suspicious for kidney cancer. She had diverticulitis and irritable bowel syndrome and underwent colonoscopy with polyps removal. According to the patient, it was benign. She complained of intermittent chills, along with trenching night sweats over the past few months  REVIEW OF SYSTEMS:   Eyes: Denies blurriness of vision Ears, nose, mouth, throat, and face: Denies mucositis or sore throat Respiratory: Denies cough, dyspnea or wheezes Cardiovascular: Denies palpitation, chest  discomfort or lower extremity swelling Skin: Denies abnormal skin rashes Lymphatics: Denies new lymphadenopathy or easy bruising Neurological:Denies numbness, tingling or new weaknesses Behavioral/Psych: Mood is stable, no new changes  All other systems were reviewed with the patient and are negative.  I have reviewed the past medical history, past surgical history, social history and family history with the patient and they are unchanged from previous note.  ALLERGIES:  is allergic to divalproex sodium; hydrochlorothiazide; imitrex; latex; mellaril; olanzapine; penicillins; topamax; aripiprazole; aspirin; dalmane; darifenacin hydrobromide er; metoclopramide hcl; seroquel; statins; stelazine; thorazine; abilify; allopurinol; barium-containing compounds; colchicine; diflucan; duract; iohexol; ivp dye; lyrica; myrbetriq; nsaids; reglan; renografin; risperdal; urocit - k; zyprexa; avelox; diclofenac; doxycycline; e-mycin; flagyl; lamictal; pregabalin; and risperidone.  MEDICATIONS:  Current Outpatient Prescriptions  Medication Sig Dispense Refill  . acyclovir (ZOVIRAX) 800 MG tablet     . almotriptan (AXERT) 12.5 MG tablet TAKE 1 TABLET (12.5 MG TOTAL) BY MOUTH AS NEEDED FOR MIGRAINE (MAX 2 TABS PER WEEK). 24 tablet 1  . bisacodyl (DULCOLAX) 5 MG EC tablet Take 10 mg by mouth daily as needed.    . cyclobenzaprine (FLEXERIL) 10 MG tablet Take 10 mg by mouth 3 (three) times daily.    Marland Kitchen docusate sodium (COLACE) 100 MG capsule Take 100 mg by mouth daily as needed.    Marland Kitchen estradiol (VIVELLE-DOT) 0.1 MG/24HR Place 1 patch onto the skin 2 (two) times a week.    . fentaNYL (DURAGESIC - DOSED MCG/HR) 25 MCG/HR patch Place 1 patch (25 mcg total) onto the skin every 3 (three) days. 10 patch 0  . furosemide (LASIX) 40 MG tablet Take 40-80 mg by mouth daily as needed.    . gabapentin (NEURONTIN) 300 MG capsule Take 600 mg by mouth at bedtime.    Marland Kitchen HYDROcodone-acetaminophen (NORCO/VICODIN) 5-325 MG  tablet Take 1  tablet by mouth every 6 (six) hours as needed for moderate pain. 90 tablet 0  . hyoscyamine (LEVSIN, ANASPAZ) 0.125 MG tablet Take 0.125 mg by mouth every 4 (four) hours as needed.    . lidocaine (XYLOCAINE) 2 % solution     . LORazepam (ATIVAN) 1 MG tablet Take 1 mg by mouth 3 (three) times daily.    . potassium chloride SA (K-DUR,KLOR-CON) 20 MEQ tablet Take 20 mEq by mouth 2 (two) times daily.    Marland Kitchen PROAIR HFA 108 (90 BASE) MCG/ACT inhaler Inhale 2 puffs into the lungs as directed.    . promethazine (PHENERGAN) 25 MG tablet every 6 (six) hours as needed.     . Trospium Chloride 60 MG CP24 Take 1 capsule by mouth Daily.    . Vitamin D, Ergocalciferol, (DRISDOL) 50000 UNITS CAPS capsule Take 50,000 Units by mouth every 7 (seven) days.     Marland Kitchen zolmitriptan (ZOMIG-ZMT) 5 MG disintegrating tablet Take 1 tablet (5 mg total) by mouth as needed for migraine (May use up to 2 tabs per week). 27 tablet 1  . EPIPEN 2-PAK 0.3 MG/0.3ML SOAJ injection Reported on 12/06/2015  0   No current facility-administered medications for this visit.    PHYSICAL EXAMINATION: ECOG PERFORMANCE STATUS: 1 - Symptomatic but completely ambulatory  Filed Vitals:   12/06/15 1009  BP: 139/52  Pulse: 94  Temp: 98.4 F (36.9 C)  Resp: 18   Filed Weights   12/06/15 1009  Weight: 136 lb 3.2 oz (61.78 kg)    GENERAL:alert, no distress and comfortable SKIN: skin color, texture, turgor are normal, no rashes or significant lesions. She has extensive facial bruising from recent fall EYES: normal, Conjunctiva are pink and non-injected, sclera clear OROPHARYNX:no exudate, no erythema and lips, buccal mucosa, and tongue normal  Musculoskeletal:no cyanosis of digits and no clubbing  NEURO: alert & oriented x 3 with fluent speech, no focal motor/sensory deficits  LABORATORY DATA:  I have reviewed the data as listed    Component Value Date/Time   NA 140 12/01/2014 1039   NA 138 03/05/2014 1130   K 2.8* 12/01/2014 1039   K  3.5* 03/05/2014 1130   CL 93* 03/05/2014 1130   CO2 30* 12/01/2014 1039   CO2 33* 03/05/2014 1130   GLUCOSE 143* 12/01/2014 1039   GLUCOSE 119* 03/05/2014 1130   BUN 14.3 12/01/2014 1039   BUN 9 03/05/2014 1130   CREATININE 0.60 12/02/2015 1153   CREATININE 0.8 12/01/2014 1039   CALCIUM 10.0 12/01/2014 1039   CALCIUM 9.7 03/05/2014 1130   PROT 8.4* 12/01/2014 1039   PROT 8.7* 11/04/2010 1505   ALBUMIN 4.1 12/01/2014 1039   ALBUMIN 4.5 11/04/2010 1505   AST 23 12/01/2014 1039   AST 44* 11/04/2010 1505   ALT 14 12/01/2014 1039   ALT 37* 11/04/2010 1505   ALKPHOS 71 12/01/2014 1039   ALKPHOS 90 11/04/2010 1505   BILITOT 0.22 12/01/2014 1039   BILITOT 0.3 11/04/2010 1505   GFRNONAA >90 03/05/2014 1130   GFRAA >90 03/05/2014 1130    No results found for: SPEP, UPEP  Lab Results  Component Value Date   WBC 6.4 12/01/2014   NEUTROABS 5.8 12/01/2014   HGB 12.2 12/01/2014   HCT 36.9 12/01/2014   MCV 92.0 12/01/2014   PLT 323 12/01/2014      Chemistry      Component Value Date/Time   NA 140 12/01/2014 1039   NA 138 03/05/2014 1130  K 2.8* 12/01/2014 1039   K 3.5* 03/05/2014 1130   CL 93* 03/05/2014 1130   CO2 30* 12/01/2014 1039   CO2 33* 03/05/2014 1130   BUN 14.3 12/01/2014 1039   BUN 9 03/05/2014 1130   CREATININE 0.60 12/02/2015 1153   CREATININE 0.8 12/01/2014 1039      Component Value Date/Time   CALCIUM 10.0 12/01/2014 1039   CALCIUM 9.7 03/05/2014 1130   ALKPHOS 71 12/01/2014 1039   ALKPHOS 90 11/04/2010 1505   AST 23 12/01/2014 1039   AST 44* 11/04/2010 1505   ALT 14 12/01/2014 1039   ALT 37* 11/04/2010 1505   BILITOT 0.22 12/01/2014 1039   BILITOT 0.3 11/04/2010 1505       RADIOGRAPHIC STUDIES:I reviewed her prior CT and MRI I have personally reviewed the radiological images as listed and agreed with the findings in the report.    ASSESSMENT & PLAN:  Left renal mass Previously, I have discussed with her urologist extensively about the  management of this patient. The patient has evidence of enlarging kidney mass, highly suspicious for renal cell carcinoma. The patient also had intermittent hematuria. She had evidence of chronic lymphadenopathy in the retroperitoneal area, suspicious for low-grade lymphoma, which is associated with risk of secondary malignancy such as primary kidney cancer Ultimately, we are in agreement for her to proceed with nephrectomy as recommended by her urologist. 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 Her prior lymph node biopsy in the inguinal region in 2015 was nondiagnostic. For her future planned surgery/left nephrectomy, I would discuss with her urologist to also obtain retroperitoneal lymph node biopsy to gain additional insight to see if she also have concurrent lymphoproliferative neoplasm She agrees with the plan of care   No orders of the defined types were placed in this encounter.   All questions were answered. The patient knows to call the clinic with any problems, questions or concerns. No barriers to learning was detected. I spent 15 minutes counseling the patient face to face. The total time spent in the appointment was 20 minutes and more than 50% was on counseling and review of test results     [facility], Allyanna Appleman, MD 12/07/2015 7:01 AM

## 2015-12-07 NOTE — Assessment & Plan Note (Signed)
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 Her prior lymph node biopsy in the inguinal region in 2015 was nondiagnostic. For her future planned surgery/left nephrectomy, I would discuss with her urologist to also obtain retroperitoneal lymph node biopsy to gain additional insight to see if she also have concurrent lymphoproliferative neoplasm She agrees with the plan of care

## 2015-12-10 ENCOUNTER — Other Ambulatory Visit: Payer: Self-pay | Admitting: Urology

## 2015-12-13 ENCOUNTER — Telehealth: Payer: Self-pay | Admitting: *Deleted

## 2015-12-13 ENCOUNTER — Telehealth: Payer: Self-pay | Admitting: Hematology and Oncology

## 2015-12-13 ENCOUNTER — Other Ambulatory Visit: Payer: Self-pay | Admitting: Urology

## 2015-12-13 ENCOUNTER — Other Ambulatory Visit: Payer: Self-pay | Admitting: Hematology and Oncology

## 2015-12-13 NOTE — Telephone Encounter (Signed)
per pof to sch pt appt-gave pt copy of avs °

## 2015-12-13 NOTE — Telephone Encounter (Signed)
Pt left message states having surgery to remove left kidney and a lymph node biopsy by Dr. Tresa Moore on 4/5.  So she will need to r/s her appts here on 4/6.

## 2015-12-13 NOTE — Telephone Encounter (Signed)
I sent new POF to see her on 4/20

## 2015-12-13 NOTE — Telephone Encounter (Signed)
s.w. pt and she does not want to keep appt she will call back to r/s after she has recouped from surgery

## 2015-12-21 ENCOUNTER — Telehealth: Payer: Self-pay | Admitting: Neurology

## 2015-12-21 ENCOUNTER — Telehealth: Payer: Self-pay | Admitting: *Deleted

## 2015-12-21 NOTE — Telephone Encounter (Signed)
Raziya called to report a bad fall in which she tripped while carrying items and fell hitting her face.  She had bloody nose and black eyes afterwards but is ok now. She just wanted Dr Naaman Plummer to know.  She is having her surgery on her kidney 01/05/16 and was asking about medications.  I told her standard is for the surgeon to prescribe in the post op period and when he releases her she would come back to resume meds here.  She will discuss with them and let us know if there is a problem.

## 2015-12-21 NOTE — Telephone Encounter (Signed)
Pt cancelled her appt in April. She says that she is going to have her left kidney removed. 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 Date right now is April 5th but want earlier.

## 2015-12-27 NOTE — Telephone Encounter (Signed)
Events noted

## 2015-12-28 ENCOUNTER — Telehealth: Payer: Self-pay | Admitting: *Deleted

## 2015-12-28 ENCOUNTER — Encounter (HOSPITAL_COMMUNITY)
Admission: RE | Admit: 2015-12-28 | Discharge: 2015-12-28 | Disposition: A | Discharge status: Home / Self Care | Payer: 59 | Source: Ambulatory Visit | Attending: Urology | Admitting: Urology

## 2015-12-28 ENCOUNTER — Encounter (HOSPITAL_COMMUNITY): Payer: Self-pay

## 2015-12-28 HISTORY — DX: Other specified postprocedural states: Z98.890

## 2015-12-28 HISTORY — DX: Irritable bowel syndrome, unspecified: K58.9

## 2015-12-28 HISTORY — DX: Unspecified osteoarthritis, unspecified site: M19.90

## 2015-12-28 HISTORY — DX: Restless legs syndrome: G25.81

## 2015-12-28 HISTORY — DX: Nausea with vomiting, unspecified: R11.2

## 2015-12-28 HISTORY — DX: Herpesviral infection, unspecified: B00.9

## 2015-12-28 HISTORY — DX: Other complications of anesthesia, initial encounter: T88.59XA

## 2015-12-28 HISTORY — DX: Generalized enlarged lymph nodes: R59.1

## 2015-12-28 HISTORY — DX: Adverse effect of unspecified anesthetic, initial encounter: T41.45XA

## 2015-12-28 HISTORY — DX: Vitamin D deficiency, unspecified: E55.9

## 2015-12-28 HISTORY — DX: Chronic pain syndrome: G89.4

## 2015-12-28 LAB — ABO/RH: ABO/RH(D): O POS

## 2015-12-28 LAB — CBC
HCT: 39.2 % (ref 36.0–46.0)
HEMOGLOBIN: 12.8 g/dL (ref 12.0–15.0)
MCH: 29.8 pg (ref 26.0–34.0)
MCHC: 32.7 g/dL (ref 30.0–36.0)
MCV: 91.4 fL (ref 78.0–100.0)
Platelets: 368 10*3/uL (ref 150–400)
RBC: 4.29 MIL/uL (ref 3.87–5.11)
RDW: 13.2 % (ref 11.5–15.5)
WBC: 7.9 10*3/uL (ref 4.0–10.5)

## 2015-12-28 LAB — BASIC METABOLIC PANEL
ANION GAP: 11 (ref 5–15)
BUN: 10 mg/dL (ref 6–20)
CALCIUM: 9.7 mg/dL (ref 8.9–10.3)
CHLORIDE: 96 mmol/L — AB (ref 101–111)
CO2: 32 mmol/L (ref 22–32)
Creatinine, Ser: 0.6 mg/dL (ref 0.44–1.00)
GFR calc non Af Amer: >60 mL/min (ref >60)
Glucose, Bld: 101 mg/dL — ABNORMAL HIGH (ref 65–99)
Potassium: 3.4 mmol/L — ABNORMAL LOW (ref 3.5–5.1)
Sodium: 139 mmol/L (ref 135–145)

## 2015-12-28 NOTE — Progress Notes (Signed)
List of ALLERGIES and SURGERIES IN CHART-NUMEROUS! 08/04/2004-Sleep Study from [place] at [facility] on chart.

## 2015-12-28 NOTE — Telephone Encounter (Signed)
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 Tammi Klippel tomorrow morning.  I requested surgery be moved up a week so I do not get dehydrated.  I'm doing my prep now.  Will arrive tomorrow at 5:30 am for 7:30 surgery.  The biopsy to lymph nodes behind intestines will be done as well.  Look for the pathology results soon.  I'll call to reschedule F/U when able."

## 2015-12-28 NOTE — Patient Instructions (Addendum)
Patty Salas  12/28/2015   Your procedure is scheduled on: Wednesday 12/29/2015  Report to [facility] Main  Entrance take [facility]  elevators to 3rd floor to  [place] at  [place]  AM.  Call this number if you have problems the morning of surgery [phone]   Remember: ONLY 1 PERSON MAY GO WITH YOU TO SHORT STAY TO GET  READY MORNING OF [place].              FOLLOW BOWEL PREP INSTRUCTIONS FROM DR. MANNY'S OFFICE WITH A CLEAR LIQUID DIET TODAY ALL DAY TIL MIDNIGHT!   Do not  drink liquids :After Midnight.     Take these medicines the morning of surgery with A SIP OF WATER: Lorazepam, use Pro-Air inhaler and bring it with you to hospital                                 You may not have any metal on your body including hair pins and              piercings  Do not wear jewelry, make-up, lotions, powders or perfumes, deodorant             Do not wear nail polish.  Do not shave  48 hours prior to surgery.              Men may shave face and neck.   Do not bring valuables to the hospital. [place].  Contacts, dentures or bridgework may not be worn into surgery.  Leave suitcase in the car. After surgery it may be brought to your room.                  Please read over the following fact sheets you were given: _____________________________________________________________________             [facility] - Preparing for Surgery Before surgery, you can play an important role.  Because skin is not sterile, your skin needs to be as free of germs as possible.  You can reduce the number of germs on your skin by washing with CHG (chlorahexidine gluconate) soap before surgery.  CHG is an antiseptic cleaner which kills germs and bonds with the skin to continue killing germs even after washing. Please DO NOT use if you have an allergy to CHG or antibacterial soaps.  If your skin becomes reddened/irritated stop  using the CHG and inform your nurse when you arrive at Short Stay. Do not shave (including legs and underarms) for at least 48 hours prior to the first CHG shower.  You may shave your face/neck. Please follow these instructions carefully:  1.  Shower with CHG Soap the night before surgery and the  morning of Surgery.  2.  If you choose to wash your hair, wash your hair first as usual with your  normal  shampoo.  3.  After you shampoo, rinse your hair and body thoroughly to remove the  shampoo.                           4.  Use CHG as you would any other liquid soap.  You can apply  chg directly  to the skin and wash                       Gently with a scrungie or clean washcloth.  5.  Apply the CHG Soap to your body ONLY FROM THE NECK DOWN.   Do not use on face/ open                           Wound or open sores. Avoid contact with eyes, ears mouth and genitals (private parts).                       Wash face,  Genitals (private parts) with your normal soap.             6.  Wash thoroughly, paying special attention to the area where your surgery  will be performed.  7.  Thoroughly rinse your body with warm water from the neck down.  8.  DO NOT shower/wash with your normal soap after using and rinsing off  the CHG Soap.                9.  Pat yourself dry with a clean towel.            10.  Wear clean pajamas.            11.  Place clean sheets on your bed the night of your first shower and do not  sleep with pets. Day of Surgery : Do not apply any lotions/deodorants the morning of surgery.  Please wear clean clothes to the hospital/surgery center.  FAILURE TO FOLLOW THESE INSTRUCTIONS MAY RESULT IN THE CANCELLATION OF YOUR SURGERY PATIENT SIGNATURE_________________________________  NURSE SIGNATURE__________________________________  ________________________________________________________________________   Adam Phenix  An incentive spirometer is a tool that can help keep your  lungs clear and active. This tool measures how well you are filling your lungs with each breath. Taking long deep breaths may help reverse or decrease the chance of developing breathing (pulmonary) problems (especially infection) following:  A long period of time when you are unable to move or be active. BEFORE THE PROCEDURE   If the spirometer includes an indicator to show your best effort, your nurse or respiratory therapist will set it to a desired goal.  If possible, sit up straight or lean slightly forward. Try not to slouch.  Hold the incentive spirometer in an upright position. INSTRUCTIONS FOR USE   Sit on the edge of your bed if possible, or sit up as far as you can in bed or on a chair.  Hold the incentive spirometer in an upright position.  Breathe out normally.  Place the mouthpiece in your mouth and seal your lips tightly around it.  Breathe in slowly and as deeply as possible, raising the piston or the ball toward the top of the column.  Hold your breath for 3-5 seconds or for as long as possible. Allow the piston or ball to fall to the bottom of the column.  Remove the mouthpiece from your mouth and breathe out normally.  Rest for a few seconds and repeat Steps 1 through 7 at least 10 times every 1-2 hours when you are awake. Take your time and take a few normal breaths between deep breaths.  The spirometer may include an indicator to show your best effort. Use the indicator as a goal to work toward during each repetition.  After each set of 10 deep breaths, practice coughing to be sure your lungs are clear. If you have an incision (the cut made at the time of surgery), support your incision when coughing by placing a pillow or rolled up towels firmly against it. Once you are able to get out of bed, walk around indoors and cough well. You may stop using the incentive spirometer when instructed by your caregiver.  RISKS AND COMPLICATIONS  Take your time so you do not  get dizzy or light-headed.  If you are in pain, you may need to take or ask for pain medication before doing incentive spirometry. It is harder to take a deep breath if you are having pain. AFTER USE  Rest and breathe slowly and easily.  It can be helpful to keep track of a log of your progress. Your caregiver can provide you with a simple table to help with this. If you are using the spirometer at home, follow these instructions: [place] IF:   You are having difficultly using the spirometer.  You have trouble using the spirometer as often as instructed.  Your pain medication is not giving enough relief while using the spirometer.  You develop fever of 100.5 F (38.1 C) or higher. SEEK IMMEDIATE MEDICAL CARE IF:   You cough up bloody sputum that had not been present before.  You develop fever of 102 F (38.9 C) or greater.  You develop worsening pain at or near the incision site. MAKE SURE YOU:   Understand these instructions.  Will watch your condition.  Will get help right away if you are not doing well or get worse. Document Released: 01/29/2007 Document Revised: 12/11/2011 Document Reviewed: 04/01/2007 ExitCare Patient Information 2014 ExitCare, [place].   ________________________________________________________________________  WHAT IS A BLOOD TRANSFUSION? Blood Transfusion Information  A transfusion is the replacement of blood or some of its parts. Blood is made up of multiple cells which provide different functions.  Red blood cells carry oxygen and are used for blood loss replacement.  White blood cells fight against infection.  Platelets control bleeding.  Plasma helps clot blood.  Other blood products are available for specialized needs, such as hemophilia or other clotting disorders. BEFORE THE TRANSFUSION  Who gives blood for transfusions?   Healthy volunteers who are fully evaluated to make sure their blood is safe. This is blood bank  blood. Transfusion therapy is the safest it has ever been in the practice of medicine. Before blood is taken from a donor, a complete history is taken to make sure that person has no history of diseases nor engages in risky social behavior (examples are intravenous drug use or sexual activity with multiple partners). The donor's travel history is screened to minimize risk of transmitting infections, such as malaria. The donated blood is tested for signs of infectious diseases, such as HIV and hepatitis. The blood is then tested to be sure it is compatible with you in order to minimize the chance of a transfusion reaction. If you or a relative donates blood, this is often done in anticipation of surgery and is not appropriate for emergency situations. It takes many days to process the donated blood. RISKS AND COMPLICATIONS Although transfusion therapy is very safe and saves many lives, the main dangers of transfusion include:   Getting an infectious disease.  Developing a transfusion reaction. This is an allergic reaction to something in the blood you were given. Every precaution is taken to prevent this. The decision  to have a blood transfusion has been considered carefully by your caregiver before blood is given. Blood is not given unless the benefits outweigh the risks. AFTER THE TRANSFUSION  Right after receiving a blood transfusion, you will usually feel much better and more energetic. This is especially true if your red blood cells have gotten low (anemic). The transfusion raises the level of the red blood cells which carry oxygen, and this usually causes an energy increase.  The nurse administering the transfusion will monitor you carefully for complications. HOME CARE INSTRUCTIONS  No special instructions are needed after a transfusion. You may find your energy is better. Speak with your caregiver about any limitations on activity for underlying diseases you may have. SEEK MEDICAL CARE IF:    Your condition is not improving after your transfusion.  You develop redness or irritation at the intravenous (IV) site. SEEK IMMEDIATE MEDICAL CARE IF:  Any of the following symptoms occur over the next 12 hours:  Shaking chills.  You have a temperature by mouth above 102 F (38.9 C), not controlled by medicine.  Chest, back, or muscle pain.  People around you feel you are not acting correctly or are confused.  Shortness of breath or difficulty breathing.  Dizziness and fainting.  You get a rash or develop hives.  You have a decrease in urine output.  Your urine turns a dark color or changes to pink, red, or brown. Any of the following symptoms occur over the next 10 days:  You have a temperature by mouth above 102 F (38.9 C), not controlled by medicine.  Shortness of breath.  Weakness after normal activity.  The white part of the eye turns yellow (jaundice).  You have a decrease in the amount of urine or are urinating less often.  Your urine turns a dark color or changes to pink, red, or brown. Document Released: 09/15/2000 Document Revised: 12/11/2011 Document Reviewed: 05/04/2008 [facility] Patient Information 2014 [place], [place].  _______________________________________________________________________

## 2015-12-28 NOTE — Telephone Encounter (Signed)
Patty Salas called to let DR Naaman Plummer know her surgery has been moved up to tomorrow morning at 7:30 at [facility].  They will remove her kidney (left ) and biopsy the nodes behind her bowel.

## 2015-12-29 ENCOUNTER — Encounter (HOSPITAL_COMMUNITY): Payer: Self-pay | Admitting: *Deleted

## 2015-12-29 ENCOUNTER — Encounter (HOSPITAL_COMMUNITY)
Admission: RE | Disposition: A | Discharge status: Home / Self Care | Payer: Self-pay | Source: Ambulatory Visit | Attending: Urology

## 2015-12-29 ENCOUNTER — Inpatient Hospital Stay (HOSPITAL_COMMUNITY): Payer: 59 | Admitting: Registered Nurse

## 2015-12-29 ENCOUNTER — Other Ambulatory Visit (HOSPITAL_COMMUNITY): Payer: 59

## 2015-12-29 ENCOUNTER — Inpatient Hospital Stay (HOSPITAL_COMMUNITY)
Admission: RE | Admit: 2015-12-29 | Discharge: 2015-12-30 | DRG: 657 | Disposition: A | Discharge status: Home / Self Care | Payer: 59 | Source: Ambulatory Visit | Attending: Urology | Admitting: Urology

## 2015-12-29 DIAGNOSIS — Z79899 Other long term (current) drug therapy: Secondary | ICD-10-CM

## 2015-12-29 DIAGNOSIS — I10 Essential (primary) hypertension: Secondary | ICD-10-CM | POA: Diagnosis present

## 2015-12-29 DIAGNOSIS — M109 Gout, unspecified: Secondary | ICD-10-CM | POA: Diagnosis present

## 2015-12-29 DIAGNOSIS — C8280 Other types of follicular lymphoma, unspecified site: Secondary | ICD-10-CM | POA: Diagnosis present

## 2015-12-29 DIAGNOSIS — Z809 Family history of malignant neoplasm, unspecified: Secondary | ICD-10-CM

## 2015-12-29 DIAGNOSIS — M4716 Other spondylosis with myelopathy, lumbar region: Secondary | ICD-10-CM

## 2015-12-29 DIAGNOSIS — K589 Irritable bowel syndrome without diarrhea: Secondary | ICD-10-CM | POA: Diagnosis present

## 2015-12-29 DIAGNOSIS — G894 Chronic pain syndrome: Secondary | ICD-10-CM | POA: Diagnosis present

## 2015-12-29 DIAGNOSIS — M797 Fibromyalgia: Secondary | ICD-10-CM | POA: Diagnosis present

## 2015-12-29 DIAGNOSIS — Z87442 Personal history of urinary calculi: Secondary | ICD-10-CM | POA: Diagnosis not present

## 2015-12-29 DIAGNOSIS — G8929 Other chronic pain: Secondary | ICD-10-CM | POA: Diagnosis present

## 2015-12-29 DIAGNOSIS — J45909 Unspecified asthma, uncomplicated: Secondary | ICD-10-CM | POA: Diagnosis present

## 2015-12-29 DIAGNOSIS — Z01812 Encounter for preprocedural laboratory examination: Secondary | ICD-10-CM | POA: Diagnosis not present

## 2015-12-29 DIAGNOSIS — F319 Bipolar disorder, unspecified: Secondary | ICD-10-CM | POA: Diagnosis present

## 2015-12-29 DIAGNOSIS — C642 Malignant neoplasm of left kidney, except renal pelvis: Principal | ICD-10-CM | POA: Diagnosis present

## 2015-12-29 DIAGNOSIS — N2889 Other specified disorders of kidney and ureter: Secondary | ICD-10-CM | POA: Diagnosis present

## 2015-12-29 DIAGNOSIS — Z981 Arthrodesis status: Secondary | ICD-10-CM

## 2015-12-29 DIAGNOSIS — M5136 Other intervertebral disc degeneration, lumbar region: Secondary | ICD-10-CM

## 2015-12-29 HISTORY — PX: LYMPHADENECTOMY: SHX5960

## 2015-12-29 HISTORY — PX: ROBOT ASSISTED LAPAROSCOPIC NEPHRECTOMY: SHX5140

## 2015-12-29 LAB — TYPE AND SCREEN
ABO/RH(D): O POS
Antibody Screen: NEGATIVE

## 2015-12-29 LAB — HEMOGLOBIN AND HEMATOCRIT, BLOOD
HCT: 36.1 % (ref 36.0–46.0)
Hemoglobin: 11.8 g/dL — ABNORMAL LOW (ref 12.0–15.0)

## 2015-12-29 SURGERY — NEPHRECTOMY, RADICAL, ROBOT-ASSISTED, LAPAROSCOPIC, ADULT
Anesthesia: General

## 2015-12-29 MED ORDER — CLINDAMYCIN PHOSPHATE 900 MG/50ML IV SOLN
INTRAVENOUS | Status: AC
  Filled 2015-12-29: qty 50

## 2015-12-29 MED ORDER — MIDAZOLAM HCL 2 MG/2ML IJ SOLN
INTRAMUSCULAR | Status: AC
  Filled 2015-12-29: qty 2

## 2015-12-29 MED ORDER — FENTANYL CITRATE (PF) 250 MCG/5ML IJ SOLN
INTRAMUSCULAR | Status: AC
  Filled 2015-12-29: qty 5

## 2015-12-29 MED ORDER — LACTATED RINGERS IR SOLN
Status: DC | PRN
  Administered 2015-12-29: 100 mL

## 2015-12-29 MED ORDER — CYCLOBENZAPRINE HCL 10 MG PO TABS
10.0000 mg | ORAL_TABLET | Freq: Three times a day (TID) | ORAL | Status: DC
  Administered 2015-12-29 – 2015-12-30 (×3): 10 mg via ORAL
  Filled 2015-12-29 (×3): qty 1

## 2015-12-29 MED ORDER — HYDROMORPHONE HCL 2 MG/ML IJ SOLN
INTRAMUSCULAR | Status: AC
  Filled 2015-12-29: qty 1

## 2015-12-29 MED ORDER — HYOSCYAMINE SULFATE 0.125 MG PO TABS
0.1250 mg | ORAL_TABLET | ORAL | Status: DC | PRN
  Filled 2015-12-29: qty 1

## 2015-12-29 MED ORDER — STERILE WATER FOR IRRIGATION IR SOLN
Status: DC | PRN
  Administered 2015-12-29: 1000 mL

## 2015-12-29 MED ORDER — ROCURONIUM BROMIDE 100 MG/10ML IV SOLN
INTRAVENOUS | Status: AC
  Filled 2015-12-29: qty 1

## 2015-12-29 MED ORDER — SODIUM CHLORIDE 0.9 % IJ SOLN
INTRAMUSCULAR | Status: AC
  Filled 2015-12-29: qty 20

## 2015-12-29 MED ORDER — HYDROMORPHONE HCL 1 MG/ML IJ SOLN
0.2500 mg | INTRAMUSCULAR | Status: DC | PRN
  Administered 2015-12-29 (×3): 0.5 mg via INTRAVENOUS

## 2015-12-29 MED ORDER — FENTANYL 25 MCG/HR TD PT72
25.0000 ug | MEDICATED_PATCH | TRANSDERMAL | Status: DC
  Administered 2015-12-29: 25 ug via TRANSDERMAL
  Filled 2015-12-29: qty 1

## 2015-12-29 MED ORDER — DEXAMETHASONE SODIUM PHOSPHATE 10 MG/ML IJ SOLN
INTRAMUSCULAR | Status: DC | PRN
  Administered 2015-12-29: 10 mg via INTRAVENOUS

## 2015-12-29 MED ORDER — EPHEDRINE SULFATE 50 MG/ML IJ SOLN
INTRAMUSCULAR | Status: DC | PRN
  Administered 2015-12-29: 5 mg via INTRAVENOUS

## 2015-12-29 MED ORDER — LIDOCAINE HCL (CARDIAC) 20 MG/ML IV SOLN
INTRAVENOUS | Status: AC
  Filled 2015-12-29: qty 5

## 2015-12-29 MED ORDER — HYDROMORPHONE HCL 1 MG/ML IJ SOLN
0.5000 mg | INTRAMUSCULAR | Status: DC | PRN
  Administered 2015-12-29 – 2015-12-30 (×4): 1 mg via INTRAVENOUS
  Filled 2015-12-29 (×4): qty 1

## 2015-12-29 MED ORDER — ACETAMINOPHEN 500 MG PO TABS
1000.0000 mg | ORAL_TABLET | Freq: Four times a day (QID) | ORAL | Status: AC
  Administered 2015-12-29 – 2015-12-30 (×4): 1000 mg via ORAL
  Filled 2015-12-29 (×4): qty 2

## 2015-12-29 MED ORDER — CIPROFLOXACIN IN D5W 400 MG/200ML IV SOLN
400.0000 mg | Freq: Two times a day (BID) | INTRAVENOUS | Status: DC
  Administered 2015-12-29: 400 mg via INTRAVENOUS

## 2015-12-29 MED ORDER — HYDROMORPHONE HCL 1 MG/ML IJ SOLN
INTRAMUSCULAR | Status: DC | PRN
  Administered 2015-12-29 (×4): 0.5 mg via INTRAVENOUS

## 2015-12-29 MED ORDER — ALBUTEROL SULFATE (2.5 MG/3ML) 0.083% IN NEBU: 3.0000 mL | INHALATION_SOLUTION | RESPIRATORY_TRACT | Status: DC | PRN

## 2015-12-29 MED ORDER — DEXAMETHASONE SODIUM PHOSPHATE 10 MG/ML IJ SOLN
INTRAMUSCULAR | Status: AC
  Filled 2015-12-29: qty 1

## 2015-12-29 MED ORDER — SODIUM CHLORIDE 0.9 % IJ SOLN
INTRAMUSCULAR | Status: DC | PRN
  Administered 2015-12-29: 20 mL

## 2015-12-29 MED ORDER — CLINDAMYCIN PHOSPHATE 900 MG/50ML IV SOLN
900.0000 mg | INTRAVENOUS | Status: AC
  Administered 2015-12-29: 900 mg via INTRAVENOUS

## 2015-12-29 MED ORDER — HYDROMORPHONE HCL 2 MG PO TABS
2.0000 mg | ORAL_TABLET | ORAL | Status: DC | PRN
  Administered 2015-12-30: 2 mg via ORAL
  Filled 2015-12-29: qty 1

## 2015-12-29 MED ORDER — FENTANYL CITRATE (PF) 100 MCG/2ML IJ SOLN
INTRAMUSCULAR | Status: DC | PRN
  Administered 2015-12-29 (×5): 50 ug via INTRAVENOUS

## 2015-12-29 MED ORDER — MIDAZOLAM HCL 5 MG/5ML IJ SOLN
INTRAMUSCULAR | Status: DC | PRN
  Administered 2015-12-29: 2 mg via INTRAVENOUS

## 2015-12-29 MED ORDER — DEXTROSE-NACL 5-0.45 % IV SOLN
INTRAVENOUS | Status: DC
  Administered 2015-12-29: 100 mL/h via INTRAVENOUS
  Administered 2015-12-29 – 2015-12-30 (×2): via INTRAVENOUS

## 2015-12-29 MED ORDER — SODIUM CHLORIDE 0.9 % IJ SOLN
INTRAMUSCULAR | Status: AC
  Filled 2015-12-29: qty 10

## 2015-12-29 MED ORDER — HYDROMORPHONE HCL 1 MG/ML IJ SOLN
INTRAMUSCULAR | Status: AC
  Filled 2015-12-29: qty 1

## 2015-12-29 MED ORDER — ONDANSETRON HCL 4 MG/2ML IJ SOLN: 4.0000 mg | Freq: Once | INTRAMUSCULAR | Status: DC

## 2015-12-29 MED ORDER — PROPOFOL 10 MG/ML IV BOLUS
INTRAVENOUS | Status: AC
  Filled 2015-12-29: qty 20

## 2015-12-29 MED ORDER — GABAPENTIN 300 MG PO CAPS
600.0000 mg | ORAL_CAPSULE | Freq: Every day | ORAL | Status: DC
  Administered 2015-12-29: 600 mg via ORAL
  Filled 2015-12-29: qty 2

## 2015-12-29 MED ORDER — SCOPOLAMINE 1 MG/3DAYS TD PT72
MEDICATED_PATCH | TRANSDERMAL | Status: DC | PRN
  Administered 2015-12-29: 1 via TRANSDERMAL

## 2015-12-29 MED ORDER — SUGAMMADEX SODIUM 200 MG/2ML IV SOLN
INTRAVENOUS | Status: AC
  Filled 2015-12-29: qty 2

## 2015-12-29 MED ORDER — CIPROFLOXACIN IN D5W 400 MG/200ML IV SOLN
INTRAVENOUS | Status: AC
  Filled 2015-12-29: qty 200

## 2015-12-29 MED ORDER — EPHEDRINE SULFATE 50 MG/ML IJ SOLN
INTRAMUSCULAR | Status: AC
  Filled 2015-12-29: qty 1

## 2015-12-29 MED ORDER — LACTATED RINGERS IV SOLN: INTRAVENOUS | Status: DC

## 2015-12-29 MED ORDER — BUPIVACAINE LIPOSOME 1.3 % IJ SUSP
20.0000 mL | Freq: Once | INTRAMUSCULAR | Status: AC
  Administered 2015-12-29: 20 mL
  Filled 2015-12-29: qty 20

## 2015-12-29 MED ORDER — DIPHENHYDRAMINE HCL 50 MG/ML IJ SOLN: 12.5000 mg | Freq: Four times a day (QID) | INTRAMUSCULAR | Status: DC | PRN

## 2015-12-29 MED ORDER — DOCUSATE SODIUM 100 MG PO CAPS: 100.0000 mg | ORAL_CAPSULE | Freq: Every day | ORAL | Status: DC | PRN

## 2015-12-29 MED ORDER — LACTATED RINGERS IV SOLN
INTRAVENOUS | Status: DC | PRN
  Administered 2015-12-29 (×2): via INTRAVENOUS

## 2015-12-29 MED ORDER — LIDOCAINE HCL (CARDIAC) 20 MG/ML IV SOLN
INTRAVENOUS | Status: DC | PRN
  Administered 2015-12-29: 80 mg via INTRAVENOUS

## 2015-12-29 MED ORDER — PROPOFOL 10 MG/ML IV BOLUS
INTRAVENOUS | Status: DC | PRN
  Administered 2015-12-29: 180 mg via INTRAVENOUS

## 2015-12-29 MED ORDER — HYDROCODONE-ACETAMINOPHEN 5-325 MG PO TABS: 1.0000 | ORAL_TABLET | Freq: Four times a day (QID) | ORAL | Status: DC | PRN

## 2015-12-29 MED ORDER — ROCURONIUM BROMIDE 100 MG/10ML IV SOLN
INTRAVENOUS | Status: DC | PRN
  Administered 2015-12-29: 50 mg via INTRAVENOUS
  Administered 2015-12-29: 5 mg via INTRAVENOUS

## 2015-12-29 MED ORDER — ACETAMINOPHEN 500 MG PO TABS: 500.0000 mg | ORAL_TABLET | Freq: Four times a day (QID) | ORAL | Status: DC | PRN

## 2015-12-29 MED ORDER — ATROPINE SULFATE 0.4 MG/ML IJ SOLN
INTRAMUSCULAR | Status: AC
  Filled 2015-12-29: qty 2

## 2015-12-29 MED ORDER — ONDANSETRON HCL 4 MG/2ML IJ SOLN: 4.0000 mg | INTRAMUSCULAR | Status: DC | PRN

## 2015-12-29 MED ORDER — SCOPOLAMINE 1 MG/3DAYS TD PT72
MEDICATED_PATCH | TRANSDERMAL | Status: AC
  Filled 2015-12-29: qty 1

## 2015-12-29 MED ORDER — LORAZEPAM 1 MG PO TABS
1.0000 mg | ORAL_TABLET | Freq: Three times a day (TID) | ORAL | Status: DC
  Administered 2015-12-29 – 2015-12-30 (×3): 1 mg via ORAL
  Filled 2015-12-29 (×3): qty 1

## 2015-12-29 MED ORDER — ONDANSETRON HCL 4 MG/2ML IJ SOLN
INTRAMUSCULAR | Status: AC
  Filled 2015-12-29: qty 2

## 2015-12-29 MED ORDER — SUGAMMADEX SODIUM 200 MG/2ML IV SOLN
INTRAVENOUS | Status: DC | PRN
  Administered 2015-12-29: 140 mg via INTRAVENOUS

## 2015-12-29 MED ORDER — DIPHENHYDRAMINE HCL 12.5 MG/5ML PO ELIX: 12.5000 mg | ORAL_SOLUTION | Freq: Four times a day (QID) | ORAL | Status: DC | PRN

## 2015-12-29 SURGICAL SUPPLY — 51 items
CHLORAPREP W/TINT 26ML (MISCELLANEOUS) ×3 IMPLANT
CLIP LIGATING HEM O LOK PURPLE (MISCELLANEOUS) IMPLANT
CLIP LIGATING HEMO LOK XL GOLD (MISCELLANEOUS) ×3 IMPLANT
CLIP LIGATING HEMO O LOK GREEN (MISCELLANEOUS) IMPLANT
COVER TIP SHEARS 8 DVNC (MISCELLANEOUS) ×2 IMPLANT
COVER TIP SHEARS 8MM DA VINCI (MISCELLANEOUS) ×1
DECANTER SPIKE VIAL GLASS SM (MISCELLANEOUS) IMPLANT
DRAIN CHANNEL 15F RND FF 3/16 (WOUND CARE) IMPLANT
DRAPE ARM DVNC X/XI (DISPOSABLE) ×8 IMPLANT
DRAPE COLUMN DVNC XI (DISPOSABLE) ×2 IMPLANT
DRAPE DA VINCI XI ARM (DISPOSABLE) ×4
DRAPE DA VINCI XI COLUMN (DISPOSABLE) ×1
DRAPE INCISE IOBAN 66X45 STRL (DRAPES) ×3 IMPLANT
DRAPE LAPAROSCOPIC ABDOMINAL (DRAPES) IMPLANT
DRAPE SHEET LG 3/4 BI-LAMINATE (DRAPES) ×3 IMPLANT
ELECT PENCIL ROCKER SW 15FT (MISCELLANEOUS) ×3 IMPLANT
ELECT REM PT RETURN 9FT ADLT (ELECTROSURGICAL) ×3
ELECTRODE REM PT RTRN 9FT ADLT (ELECTROSURGICAL) ×2 IMPLANT
EVACUATOR SILICONE 100CC (DRAIN) IMPLANT
GLOVE BIO SURGEON STRL SZ 6.5 (GLOVE) ×3 IMPLANT
GLOVE BIOGEL M STRL SZ7.5 (GLOVE) IMPLANT
GOWN STRL REUS W/TWL LRG LVL3 (GOWN DISPOSABLE) ×9 IMPLANT
KIT BASIN OR (CUSTOM PROCEDURE TRAY) ×3 IMPLANT
LIQUID BAND (GAUZE/BANDAGES/DRESSINGS) ×6 IMPLANT
LOOP VESSEL MAXI BLUE (MISCELLANEOUS) ×3 IMPLANT
NEEDLE INSUFFLATION 14GA 120MM (NEEDLE) ×3 IMPLANT
POUCH ENDO CATCH II 15MM (MISCELLANEOUS) ×3 IMPLANT
RELOAD STAPLER WHITE 60MM (STAPLE) ×10 IMPLANT
SEAL CANN UNIV 5-8 DVNC XI (MISCELLANEOUS) ×8 IMPLANT
SEAL XI 5MM-8MM UNIVERSAL (MISCELLANEOUS) ×4
SET TUBE IRRIG SUCTION NO TIP (IRRIGATION / IRRIGATOR) ×3 IMPLANT
SOLUTION ELECTROLUBE (MISCELLANEOUS) ×3 IMPLANT
SPONGE LAP 4X18 X RAY DECT (DISPOSABLE) ×3 IMPLANT
STAPLE ECHEON FLEX 60 POW ENDO (STAPLE) ×3 IMPLANT
STAPLER RELOAD WHITE 60MM (STAPLE) ×15
SUT ETHILON 3 0 PS 1 (SUTURE) IMPLANT
SUT MNCRL AB 4-0 PS2 18 (SUTURE) ×6 IMPLANT
SUT PDS AB 1 CT1 27 (SUTURE) ×9 IMPLANT
SUT VIC AB 2-0 SH 27 (SUTURE) ×1
SUT VIC AB 2-0 SH 27X BRD (SUTURE) ×2 IMPLANT
SUT VICRYL 0 UR6 27IN ABS (SUTURE) ×3 IMPLANT
TAPE STRIPS DRAPE STRL (GAUZE/BANDAGES/DRESSINGS) IMPLANT
TOWEL OR NON WOVEN STRL DISP B (DISPOSABLE) ×3 IMPLANT
TRAY FOLEY W/METER SILVER 14FR (SET/KITS/TRAYS/PACK) IMPLANT
TRAY FOLEY W/METER SILVER 16FR (SET/KITS/TRAYS/PACK) IMPLANT
TRAY LAPAROSCOPIC (CUSTOM PROCEDURE TRAY) ×3 IMPLANT
TROCAR BLADELESS OPT 12M 100M (ENDOMECHANICALS) ×3 IMPLANT
TROCAR BLADELESS OPT 5 100 (ENDOMECHANICALS) IMPLANT
TROCAR UNIVERSAL OPT 12M 100M (ENDOMECHANICALS) ×3 IMPLANT
TROCAR XCEL 12X100 BLDLESS (ENDOMECHANICALS) ×3 IMPLANT
WATER STERILE IRR 1500ML POUR (IV SOLUTION) ×3 IMPLANT

## 2015-12-29 NOTE — H&P (Signed)
Patty Salas is an 60 y.o. female.    Chief Complaint: Pre op LEFT robotic radical nephrectomy and retroperitoneal lymphadenectomy  HPI:     1 - Left solid enhancing renal mass - 3.5 cm left mid solid mass incidental by [place] then confrimed by dedicated MRI 2017. Mass very endophytic and abuts hilar fat and vessels. Renovascular anatomy somewhat complex with main artery / vein superiorly (large lumbar below level of artery of main vein trunk) and likely inferior accessory artery that orriginates from left common iliac artery. Some chronic / stable adenopathy. No obvious distant disease. Contralateral kidney nromal.   2 - Diffuse chronic Adenopathy - follows with Dr. 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 Had prior node biopsy that was inconclusive. Dr. Alvy Salas has reqeusted additional lymph node sampling if possible at time of left nephrectomy. She does have several <2cm nodes along peri-aoric area by most recent imaging.   PMH sig for IBS, Chronic Pain (chronic narcotics with fentanyl patch and pain contract with Dr. 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 Her husband "Patty Salas" is very involved with her care.   Today Patty Salas is seen to proceed with left radical nephrectomy and retroperitoneal lymph node dissection. No interval fevers.    Past Medical History  Diagnosis Date  . Myalgia and myositis, unspecified   . Fibromyalgia   . Anxiety   . Depression   . Cervical facet syndrome   . Calcifying tendinitis of shoulder   . Carpal tunnel syndrome   . Thoracic radiculopathy   . Hypertension   . Bipolar affective ([place])   . Gout   . Asthma   . Renal calculi   . Diverticulosis   . PTSD (post-traumatic stress disorder)   . Full dentures   . Wears glasses   . Falls   . Chronic pain syndrome   . Arthritis   . IBS (irritable bowel syndrome)     with diarrhea  . Headache(784.0)     migraines  .  Herpesviral infection   . Vitamin D deficiency   . Restless legs syndrome (RLS)   . Lymphadenopathy   . Complication of anesthesia   . PONV (postoperative nausea and vomiting)     Past Surgical History  Procedure Laterality Date  . Neck surgery  2002  . Foot surgery      lt  . Cystoscopy    . Abdominal adhesion surgery    . Colonoscopy    . Cholecystectomy  1985  . Tonsillectomy  1976  . Colonoscopy    . Lithotripsy    . Lymph node biopsy Right 03/06/2014    Procedure: right groin LYMPH NODE BIOPSY;  Surgeon: Patty Roof, MD;  Location: [facility];  Service: General;  Laterality: Right;  . Cervical disc surgery  06/04/2001    with fusion  .  kidney stones    . Jj stent  07/2002    with ureteroscopy, cystoscopy and then removal of stent in office  . Abdominal hysterectomy  1995    partial- hemmoraged after surgery-stitch came loose  . Appendectomy  1993    hemmoraged after surgery- stitch came loose    Family History  Problem Relation Age of Onset  . Cancer Mother   . Migraines Mother   . Heart disease Father   . Hypertension Father   . Cancer Father     colon  . Hypertension Brother   . Migraines  Brother   . Hypertension Brother   . Migraines Brother   . Migraines Daughter    Social History:  reports that she has never smoked. She has never used smokeless tobacco. She reports that she does not drink alcohol or use illicit drugs.  Allergies:  Allergies  Allergen Reactions  . Cymbalta [Duloxetine Hcl] Anaphylaxis    Suicidal.  . Divalproex Sodium Anaphylaxis  . Duract [Bromfenac] Anaphylaxis  . Hydrochlorothiazide Anaphylaxis    Pt loses facial movement uncontrolled;   . Imitrex [Sumatriptan Succinate] Anaphylaxis  . Latex Anaphylaxis  . Mellaril Anaphylaxis  . Olanzapine Anaphylaxis  . Penicillins Anaphylaxis  . Topamax Anaphylaxis and Other (See Comments)    Confusion, tremor, blurred vision  . Aripiprazole Other (See Comments)     Stiffened muscles  . Aspirin Hives and Itching  . Dalmane [Flurazepam Hcl] Other (See Comments)    Stiffened all muscles  . Darifenacin Hydrobromide Er Hives    Hives on back and looked like sunburn  . Metoclopramide Hcl Other (See Comments)    headache  . Seroquel [Quetiapine Fumerate] Other (See Comments)    extreme fatigue and bad dreams  . Statins Hives  . Stelazine Other (See Comments)    Stiffened muscles  . Thorazine [Chlorpromazine Hcl] Other (See Comments)    Stiffened muscles  . Abilify [Aripiprazole]     Stiffened muscles, extrapyramidal effects all over body.   . Alka-Seltzer [Aspirin Effervescent]     Mouth ulcers, swallowing problems.   . Allopurinol Hives  . Biofreeze [Menthol (Topical Analgesic)] Hives and Itching  . Capzasin [Capsaicin] Itching    Redness   . Colchicine Hives  . Cough Drops [Benzocaine]     orthicoat sprays containing menthol or lemon flavor--breaks inside of mouth out, red rash, mouth ulcers.   . Diflucan [Fluconazole] Hives and Itching  . Iohexol      Code: HIVES, Desc: pt broke out in red rash and hives after CT injection on 12/07/09.-pt needs 13 hr prep kit, Onset Date: [id number]   . Ivp Dye [Iodinated Diagnostic Agents]   . Lyrica [Pregabalin]   . Metamucil [Psyllium]     Mouth ulcers, swallowing problems.  Merril Abbe [Polyethylene Glycol] Nausea And Vomiting  . Myrbetriq [Mirabegron]     swelling  . Nsaids     Mouth ulcers, swelling of mouth.   . Other Itching and Other (See Comments)    Dust, mold, feathers, wool, flannel, cologne, chlorox, household cleaning products, scented candles, cinnamon, ivory soap, paints, sun sentitive, acidic fruits, ( lemons, limes, strawbery, tartrazine yellow#5 and 6 in foods, MSG food additives, sudiium lauryl sulates, hot peppers, spearmint, wintergreen peppermint, mentol, orange, jalapenos. ---headache, breathing, welps, canker sores inside mouth, uticaria  . Potassium-Containing Compounds     Undiluted  through IV. --Pain.   . Prednisone     No  Sleep at night.   . Reglan [Metoclopramide]     Headaches.   . Renografin [Diatrizoate]     welps   . Risperdal [Risperidone] Other (See Comments)    Too sedating.   Marland Kitchen Seroquel [Quetiapine Fumarate]     Bad dreams, too sedating.  . Simvastatin Other (See Comments)    Paranoid affect, muscle spasms.   Marland Kitchen Urocit - K [Potassium Citrate]   . Zyprexa [Olanzapine]   . Avelox [Moxifloxacin Hcl In Nacl] Nausea And Vomiting  . Barium-Containing Compounds Rash  . Diclofenac Rash  . Doxycycline Nausea Only    Stomach upset.   Marland Kitchen E-Mycin [Erythromycin  Base] Nausea Only  . Fiorinal [Butalbital-Aspirin-Caffeine] Itching and Rash    Welps,  . Flagyl [Metronidazole Hcl] Nausea And Vomiting  . Lamictal [Lamotrigine] Rash  . Pregabalin     Blurry vision, muscle stiffness   . Risperidone Other (See Comments)    Too sedating  . Toradol [Ketorolac Tromethamine] Itching and Rash    Medications Prior to Admission  Medication Sig Dispense Refill  . acetaminophen (TYLENOL) 500 MG tablet Take 500 mg by mouth every 6 (six) hours as needed for moderate pain.    Marland Kitchen acyclovir (ZOVIRAX) 800 MG tablet Take 800 mg by mouth 3 (three) times daily as needed (sores on mouth.).     Marland Kitchen almotriptan (AXERT) 12.5 MG tablet TAKE 1 TABLET (12.5 MG TOTAL) BY MOUTH AS NEEDED FOR MIGRAINE (MAX 2 TABS PER WEEK). 24 tablet 1  . bisacodyl (DULCOLAX) 5 MG EC tablet Take 10 mg by mouth daily as needed for mild constipation.     . cyclobenzaprine (FLEXERIL) 10 MG tablet Take 10 mg by mouth 3 (three) times daily.    Marland Kitchen docusate sodium (COLACE) 100 MG capsule Take 100 mg by mouth daily as needed for mild constipation.     Marland Kitchen EPIPEN 2-PAK 0.3 MG/0.3ML SOAJ injection Inject 0.3 mg into the skin daily as needed (allergic reaction.). Reported on 12/06/2015  0  . estradiol (VIVELLE-DOT) 0.1 MG/24HR Place 1 patch onto the skin 2 (two) times a week. Wed and Saturday.    . fentaNYL (DURAGESIC - DOSED  MCG/HR) 25 MCG/HR patch Place 1 patch (25 mcg total) onto the skin every 3 (three) days. 10 patch 0  . furosemide (LASIX) 40 MG tablet Take 40-80 mg by mouth daily as needed for fluid or edema.     . gabapentin (NEURONTIN) 300 MG capsule Take 600 mg by mouth at bedtime.    Marland Kitchen HYDROcodone-acetaminophen (NORCO/VICODIN) 5-325 MG tablet Take 1 tablet by mouth every 6 (six) hours as needed for moderate pain. 90 tablet 0  . hyoscyamine (LEVSIN, ANASPAZ) 0.125 MG tablet Take 0.125 mg by mouth every 4 (four) hours as needed for bladder spasms or cramping.     . lidocaine (XYLOCAINE) 2 % solution Use as directed 20 mLs in the mouth or throat daily as needed for mouth pain.     Marland Kitchen LORazepam (ATIVAN) 1 MG tablet Take 1 mg by mouth 3 (three) times daily.    Marland Kitchen MOVIPREP 100 g SOLR 1 PACKET AS DIRECTED AFTERNOON ON DAY BEFORE NEXT SURGERY  0  . potassium chloride SA (K-DUR,KLOR-CON) 20 MEQ tablet Take 20 mEq by mouth 2 (two) times daily.    Marland Kitchen PROAIR HFA 108 (90 BASE) MCG/ACT inhaler Inhale 2 puffs into the lungs every 4 (four) hours as needed for wheezing or shortness of breath.     . promethazine (PHENERGAN) 25 MG tablet Take 25 mg by mouth every 6 (six) hours as needed for nausea or vomiting.     . Trospium Chloride 60 MG CP24 Take 1 capsule by mouth every morning.     . Vitamin D, Ergocalciferol, (DRISDOL) 50000 UNITS CAPS capsule Take 50,000 Units by mouth every 7 (seven) days.     Marland Kitchen FLUARIX QUADRIVALENT 0.5 ML injection TO BE ADMINISTERED BY PHARMACIST FOR IMMUNIZATION  0  . zolmitriptan (ZOMIG-ZMT) 5 MG disintegrating tablet Take 1 tablet (5 mg total) by mouth as needed for migraine (May use up to 2 tabs per week). 27 tablet 1    Results for orders placed or performed  during the hospital encounter of 12/28/15 (from the past 48 hour(s))  Basic metabolic panel     Status: Abnormal   Collection Time: 12/28/15 12:50 PM  Result Value Ref Range   Sodium 139 135 - 145 mmol/L   Potassium 3.4 (L) 3.5 - 5.1 mmol/L    Chloride 96 (L) 101 - 111 mmol/L   CO2 32 22 - 32 mmol/L   Glucose, Bld 101 (H) 65 - 99 mg/dL   BUN 10 6 - 20 mg/dL   Creatinine, Ser 0.60 0.44 - 1.00 mg/dL   Calcium 9.7 8.9 - 10.3 mg/dL   GFR calc non Af Amer >60 >60 mL/min   GFR calc Af Amer >60 >60 mL/min    Comment: (NOTE) The eGFR has been calculated using the CKD EPI equation. This calculation has not been validated in all clinical situations. eGFR's persistently <60 mL/min signify possible Chronic Kidney Disease.    Anion gap 11 5 - 15  CBC     Status: None   Collection Time: 12/28/15 12:50 PM  Result Value Ref Range   WBC 7.9 4.0 - 10.5 K/uL   RBC 4.29 3.87 - 5.11 MIL/uL   Hemoglobin 12.8 12.0 - 15.0 g/dL   HCT 39.2 36.0 - 46.0 %   MCV 91.4 78.0 - 100.0 fL   MCH 29.8 26.0 - 34.0 pg   MCHC 32.7 30.0 - 36.0 g/dL   RDW 13.2 11.5 - 15.5 %   Platelets 368 150 - 400 K/uL  Type and screen All Cardiac and thoracic surgeries, spinal fusions, myomectomies, craniotomies, colon & liver resections, total joint revisions, same day c-section with placenta previa or accreta.     Status: None   Collection Time: 12/28/15 12:50 PM  Result Value Ref Range   ABO/RH(D) O POS    Antibody Screen NEG    Sample Expiration 01/11/2016    Extend sample reason NO TRANSFUSIONS OR PREGNANCY IN THE PAST 3 MONTHS   ABO/Rh     Status: None   Collection Time: 12/28/15  1:00 PM  Result Value Ref Range   ABO/RH(D) O POS    No results found.  Review of Systems  Constitutional: Negative.   HENT: Negative.   Eyes: Negative.   Respiratory: Negative.   Cardiovascular: Negative.   Gastrointestinal: Negative.   Genitourinary: Negative.   Musculoskeletal: Negative.   Skin: Negative.   Neurological: Negative.   Endo/Heme/Allergies: Negative.   Psychiatric/Behavioral: Negative.     Blood pressure 116/41, pulse 92, temperature 98.2 F (36.8 C), temperature source Oral, resp. rate 18, height 5' 5.5" (1.664 m), weight 61.145 kg (134 lb 12.8 oz),  SpO2 100 %. Physical Exam  Constitutional: She appears well-developed.  Stigmata of chronic opiod use  HENT:  Head: Normocephalic.  Eyes: Pupils are equal, round, and reactive to light.  Neck: Normal range of motion.  Cardiovascular: Normal rate.   Respiratory: Effort normal.  GI: Soft.  thin  Genitourinary:  No CVAT  Neurological: She is alert.  Skin: Skin is warm.  Psychiatric: Thought content normal.  Somewhat labile affect as per baseline     Assessment/Plan  1 -  Left solid enhancing renal mass - likely localized renal cell carcinoma. Location and endophytic nature do not favor nephron sparing. Rec left robotic radical nephretomy with concomitant retroperitoneal lymph node dissection to help in characterization of lymphoprolinerative issue as well as r/o renal metastatic disease.  We rediscussed the role of radical nephrectomy with the overall goal of complete surgical excision (  negative margins) and better staging / diagnosis. We specifically rediscussed that with removal of the kidney there would be an overall renal function decline with attendant risks of renal failure and need for dialysis in some cases, and need for kidney-friendly lifestyle post-op with excellent blood pressure and glycemic control. We then rediscussed surgical approaches including robotic and open techniques with robotic associated with a shorter convalescence. I showed the patient on their abdomen the approximately 4-6 incision (trocar) sites as well as presumed extraction sites with robotic approach as well as possible open incision sites. We specifically readdressed that there may be need to alter operative plans according to intraopertive findings including conversion to open procedure. We rediscussed specific peri-operative risks including bleeding, infection, deep vein thrombosis, pulmonary embolism, compartment syndrome, nuropathy / neuropraxia, heart attack, stroke, death, as well as long-term risks such  as non-cure / need for additional therapy and need for imaging and lab based post-op surveillance protocols. We rediscussed typical hospital course of approximately 2 day hospitalization, need for peri-operative drains / catheters, and typical post-hospital course with return to most non-strenuous activities by 2 weeks and ability to return to most jobs and more strenuous activity such as exercise by 6 weeks.    After this lengthy and detail discussion, including answering all of the patient's questions to their satisfaction, they have chosen to proceed with left robotic radical nephrectomy with retroperitoneal lymph node dissection. Frankly rediscussed that higher chance of open conversion / need for adhesiolysis with prior abd surgery and ipsilateral lithotripies. She and her husband have very good understanding.   2 - Diffuse chronic Adenopathy - will obtain additional node tissue as long as appears safe at time of nephrectomy  Alexis Frock, MD 12/29/2015, 5:35 AM

## 2015-12-29 NOTE — Brief Op Note (Signed)
12/29/2015  10:16 AM  PATIENT:  Patty Salas  60 y.o. female  PRE-OPERATIVE DIAGNOSIS:  LEFT RENAL MASS, ABDOMINAL  POST-OPERATIVE DIAGNOSIS:  LEFT RENAL MASS, ABDOMINAL  PROCEDURE:  Procedure(s): XI ROBOTIC ASSISTED LEFT LAPAROSCOPIC NEPHRECTOMY (Left) RETROPERITONEAL LYMPHADENECTOMY (N/A)  SURGEON:  Surgeon(s) and Role:    * Alexis Frock, MD - Primary  PHYSICIAN ASSISTANT:   ASSISTANTS: Debbrah Alar PA   ANESTHESIA:   local and general  EBL:  Total I/O In: 2000 [I.V.:2000] Out: 275 [Urine:150; Blood:125]  BLOOD ADMINISTERED:none  DRAINS: none   LOCAL MEDICATIONS USED:  MARCAINE     SPECIMEN:  Source of Specimen:  1 - left kidney 2 - periaortic lymp nodes  DISPOSITION OF SPECIMEN:  PATHOLOGY  COUNTS:  YES  TOURNIQUET:  * No tourniquets in log *  DICTATION: .Other Dictation: Dictation Number [id number]  PLAN OF CARE: Admit to inpatient   PATIENT DISPOSITION:  PACU - hemodynamically stable.   Delay start of Pharmacological VTE agent (>24hrs) due to surgical blood loss or risk of bleeding: yes

## 2015-12-29 NOTE — Op Note (Signed)
Patty Salas, Patty Salas NO.:  [id number]  MEDICAL RECORD NO.:  [id number]  LOCATION:  [id number]                         FACILITY:  [facility]  PHYSICIAN:  Alexis Frock, MD     DATE OF BIRTH:  01/16/1956  DATE OF PROCEDURE: 12/29/2015                               OPERATIVE REPORT  DIAGNOSES:  Central, enhancing, enlarging left renal mass, history of lymphoproliferative disorder.  PROCEDURES: 1. Robotic-assisted laparoscopic left radical nephrectomy. 2. Left retroperitoneal lymph node dissection.  ASSISTANT:  Debbrah Alar, PA.  ESTIMATED BLOOD LOSS:  50 mL.  COMPLICATION:  None.  SPECIMEN: 1. Left radical nephrectomy. 2. Left periaortic lymph nodes, fresh for lymphoma protocol.  FINDINGS: 1. Dominant single artery, single vein left renovascular anatomy. 2. Smaller lower pole accessory vessel as anticipated. 3. Mild desmoplastic reaction around the kidney consistent with prior     lithotripsy and known mild lymphoproliferative disorder. 4. Enlarged, but not bulky lymphadenopathy in the perihilar region,     periaortic region.  INDICATION:  Patty Salas is a 60 year old lady with history of chronic pain syndrome and sequelae from this, who was found incidentally of abdominal pain to have an endophytic left renal mass.  This was initially surveilled; however, this was found to be enlarging.  She was referred for consideration of definitive management.  Options were discussed including surveillance protocols versus ablative therapy versus surgical extirpation with and without nephron sparing.  We discussed the latter extensively.  She does have a history of nephrolithiasis, the mass though is enhancing, this is enlarged over several imaging surveillance studies and is very endophytic encroaching on hilar fat and vessels.  We therefore both agreed that radical nephrectomy be warranted.  The patient's medical oncologist also requested that if possible, we obtain  additional lymph node tissue for sampling for further characterization of her lymphoproliferative disorder.  She wished to proceed.  Informed consent was obtained and placed in the medical record.  PROCEDURE IN DETAIL:  The patient being Patty Salas, was verified. Procedure being left robotic radical nephrectomy with retroperitoneal lymph node dissection was confirmed.  Procedure was carried out.  Time- out was performed.  Intravenous antibiotics were administered.  General endotracheal anesthesia was introduced.  The patient was placed into a left side up full flank position employing 15 degrees of stable flexion, superior arm elevator, axillary roll, sequential compression devices, bottom leg bent, top leg straight.  She was further fashioned to the operating table using 3-inch tape over foam padding across her supraxiphoid chest and her pelvis.  Next, sterile field was created by prepping and draping the patient's entire left flank using chlorhexidine gluconate and a high-flow, low-pressure pneumoperitoneum was obtained using Veress technique in the left lower quadrant having passed the aspiration and drop test.  Next, an 8-mm robotic camera port was placed in position approximately 3.5 fingerbreadths superolateral to the umbilicus.  Laparoscopic examination of the peritoneal cavity revealed some mild adhesions in the infraumbilical region consistent with known prior gynecologic surgery.  There was a single loop of bowel that was adherent to the abdominal wall.  This appeared to be well below the umbilicus and that formal adhesiolysis would not be warranted, as  such, additional ports were placed as follows:  Left subcostal 8-mm robotic port, left far lateral 8-mm robotic port approximately 3 fingerbreadths superomedial to the anterior superior iliac spine, left paramedian inferior robotic port approximately 1 handbreadth superior to the pubic ramus, this carefully placed, such that,  it did not entering the area of pelvic adhesions and a 12-mm port in the supraumbilical fold and another one 3 fingerbreadths above this in the midline.  Robot was docked and passed through the electronic checks.  Attention was directed at development of the left retroperitoneum.  Incision was made lateral to the descending colon from the area of the splenic flexure towards the area of the internal ring.  This was carefully swept medially.  The lower pole of kidney was identified, placed on gentle lateral traction. Dissection proceeded medial to this.  The ureter was encountered as was the gonadal vein.  The gonadal vein was very large in caliber with numerous medial tributaries; therefore, it was kept medially at this point.  Dissection proceeded within this triangle superiorly towards the area of the renal hilum.  Renal hilum consisted of a dominant artery and dominant vein as anticipated.  There was a smaller lower accessory artery that did appear to be coursing from the iliacs as anticipated. This was controlled using medium-sized clip.  The gonadal vein was controlled using vascular load stapler, which was quite large in diameter.  This allowed better visualization of the left renal artery, which was circumferentially mobilized and controlled using extra large clip proximally and staple load distally.  The vein was controlled using vascular load stapler.  Additional load vascular load stapler was placed on the medial aspect of the left adrenal that stringed away from the great vessels.  Superior and lateral attachments were then taken down. The ureter was doubly clipped and ligated.  This freed up the left nephrectomy specimen, which was placed into the extra large EndoCatch bag for later retrieval.  Attention was directed at formal retroperitoneal lymphadenectomy, fibrofatty tissue within the prior shadow of the left kidney overlying the aorta and lateral to the aorta was very very  carefully mobilized.  Exquisite care was taken to achieve lymphostasis with cold clips an approximately 5 cm x 2 cm column. Periaortic lymph node was dissected free and set aside.  A separate specimen to set aside fresh for lymphoma protocol.  Following these maneuvers, all sponge and needle counts were correct.  Robot was undocked.  Specimen was retrieved by connecting two previous assistant port site in the midline.  This site was closed at the level of fascia using figure-of-eight PDS x5 followed by reapproximation of the Scarpa's with running Vicryl.  All incision sites were infiltrated with dilute lyophilized Marcaine, closed at the level of the skin using subcuticular Monocryl followed by Dermabond, procedure was terminated.  The patient tolerated the procedure well.  There were no immediate periprocedural complications.  The patient was taken to the postanesthesia care unit in stable condition.          ______________________________ Alexis Frock, MD     TM/MEDQ  D:  12/29/2015  T:  12/29/2015  Job:  [id number]

## 2015-12-29 NOTE — Discharge Instructions (Signed)

## 2015-12-29 NOTE — Anesthesia Procedure Notes (Signed)
Procedure Name: Intubation Date/Time: 12/29/2015 7:33 AM Performed by: Carleene Cooper A Pre-anesthesia Checklist: Patient identified, Emergency Drugs available, Suction available, Patient being monitored and Timeout performed Patient Re-evaluated:Patient Re-evaluated prior to inductionOxygen Delivery Method: Circle system utilized Preoxygenation: Pre-oxygenation with 100% oxygen Intubation Type: IV induction Ventilation: Mask ventilation without difficulty Laryngoscope Size: Mac and 4 Grade View: Grade I Tube type: Oral Tube size: 7.5 mm Number of attempts: 1 Airway Equipment and Method: Stylet Placement Confirmation: ETT inserted through vocal cords under direct vision,  positive ETCO2 and breath sounds checked- equal and bilateral Secured at: 21 cm Tube secured with: Tape Dental Injury: Teeth and Oropharynx as per pre-operative assessment

## 2015-12-29 NOTE — Transfer of Care (Signed)
Immediate Anesthesia Transfer of Care Note  Patient: SUMIE NESTA  Procedure(s) Performed: Procedure(s): XI ROBOTIC ASSISTED LEFT LAPAROSCOPIC NEPHRECTOMY (Left) RETROPERITONEAL LYMPHADENECTOMY (N/A)  Patient Location: PACU  Anesthesia Type:General  Level of Consciousness: awake, alert , oriented and patient cooperative  Airway & Oxygen Therapy: Patient Spontanous Breathing and Patient connected to face mask oxygen  Post-op Assessment: Report given to RN, Post -op Vital signs reviewed and stable and Patient moving all extremities  Post vital signs: Reviewed and stable  Last Vitals:  Filed Vitals:   12/29/15 0525  BP: 116/41  Pulse: 92  Temp: 36.8 C  Resp: 18    Complications: No apparent anesthesia complications

## 2015-12-29 NOTE — Anesthesia Preprocedure Evaluation (Signed)
Anesthesia Evaluation  Patient identified by MRN, date of birth, ID band Patient awake    Reviewed: Allergy & Precautions, NPO status , Patient's Chart, lab work & pertinent test results  History of Anesthesia Complications (+) PONV and history of anesthetic complications  Airway Mallampati: II  TM Distance: >3 FB Neck ROM: Full    Dental  (+) Lower Dentures, Upper Dentures   Pulmonary asthma ,    Pulmonary exam normal breath sounds clear to auscultation       Cardiovascular hypertension, Pt. on medications Normal cardiovascular exam Rhythm:Regular Rate:Normal     Neuro/Psych  Headaches, PSYCHIATRIC DISORDERS Anxiety Depression Bipolar Disorder  Neuromuscular disease    GI/Hepatic Neg liver ROS,   Endo/Other    Renal/GU Renal diseaseLeft Renal mass Hx/o Renal calculi  negative genitourinary   Musculoskeletal  (+) Arthritis , Fibromyalgia -Lumbar DDD Cervical and Lumbar Spondylosis   Abdominal   Peds  Hematology Gout   Anesthesia Other Findings   Reproductive/Obstetrics negative OB ROS                             Anesthesia Physical Anesthesia Plan  ASA: III  Anesthesia Plan: General   Post-op Pain Management:    Induction: Intravenous  Airway Management Planned: Oral ETT  Additional Equipment:   Intra-op Plan:   Post-operative Plan: Extubation in OR  Informed Consent: I have reviewed the patients History and Physical, chart, labs and discussed the procedure including the risks, benefits and alternatives for the proposed anesthesia with the patient or authorized representative who has indicated his/her understanding and acceptance.   Dental advisory given  Plan Discussed with: CRNA, Anesthesiologist and Surgeon  Anesthesia Plan Comments:         Anesthesia Quick Evaluation

## 2015-12-29 NOTE — Anesthesia Postprocedure Evaluation (Signed)
Anesthesia Post Note  Patient: Patty Salas  Procedure(s) Performed: Procedure(s) (LRB): XI ROBOTIC ASSISTED LEFT LAPAROSCOPIC NEPHRECTOMY (Left) RETROPERITONEAL LYMPHADENECTOMY (N/A)  Patient location during evaluation: PACU Anesthesia Type: General Level of consciousness: awake and alert and oriented Pain management: pain level controlled Vital Signs Assessment: post-procedure vital signs reviewed and stable Respiratory status: spontaneous breathing, nonlabored ventilation, respiratory function stable and patient connected to nasal cannula oxygen Cardiovascular status: blood pressure returned to baseline and stable Postop Assessment: no signs of nausea or vomiting Anesthetic complications: no    Last Vitals:  Filed Vitals:   12/29/15 1130 12/29/15 1145  BP: 115/65 116/82  Pulse: 77 67  Temp: 36.3 C 36.3 C  Resp: 9 12    Last Pain:  Filed Vitals:   12/29/15 1148  PainSc: 5                  Antiono Ettinger A.

## 2015-12-30 LAB — HEMOGLOBIN AND HEMATOCRIT, BLOOD
HCT: 30.1 % — ABNORMAL LOW (ref 36.0–46.0)
Hemoglobin: 9.9 g/dL — ABNORMAL LOW (ref 12.0–15.0)

## 2015-12-30 LAB — BASIC METABOLIC PANEL
ANION GAP: 8 (ref 5–15)
BUN: 9 mg/dL (ref 6–20)
CALCIUM: 8.7 mg/dL — AB (ref 8.9–10.3)
CO2: 30 mmol/L (ref 22–32)
CREATININE: 1.03 mg/dL — AB (ref 0.44–1.00)
Chloride: 103 mmol/L (ref 101–111)
GFR calc Af Amer: >60 mL/min (ref >60)
GFR, EST NON AFRICAN AMERICAN: 57 mL/min — AB (ref >60)
GLUCOSE: 118 mg/dL — AB (ref 65–99)
Potassium: 3.2 mmol/L — ABNORMAL LOW (ref 3.5–5.1)
Sodium: 141 mmol/L (ref 135–145)

## 2015-12-30 MED ORDER — HYDROMORPHONE HCL 2 MG PO TABS: 2.0000 mg | ORAL_TABLET | ORAL | Status: DC | PRN

## 2015-12-30 NOTE — Discharge Summary (Signed)
Date of admission: 12/29/2015  Date of discharge: 12/30/2015  Admission diagnosis: Left renal mass  Discharge diagnosis: same  Secondary diagnoses: lymphoproliverative disorder  History and Physical: For full details, please see admission history and physical. Briefly, Patty Salas is a 60 y.o. year old patient with left renal mass and lymphoma here for surgery.  Procedure(s): XI ROBOTIC ASSISTED LEFT LAPAROSCOPIC NEPHRECTOMY RETROPERITONEAL LYMPHADENECTOMY    Hospital Course: The patient underwent the above procedure. They tolerated surgery well and were transferred to the floor postoperatively. Their diet was slowly advanced and at the time of discharge they were tolerating a regular diet. At the time of discharge their pain was controlled with PO medications and they were ambulating without difficulty. Their foley was removed prior to discharge and they were voiding spontaneously. They were discharged home on post op day 1.    Laboratory values:  Recent Labs  12/28/15 1250 12/29/15 1112 12/30/15 0444  HGB 12.8 11.8* 9.9*  HCT 39.2 36.1 30.1*    Recent Labs  12/28/15 1250 12/30/15 0444  CREATININE 0.60 1.03*    Disposition: Home  Discharge instruction: The patient was instructed to be ambulatory but told to refrain from heavy lifting, strenuous activity, or driving.   Discharge medications:    Medication List    STOP taking these medications        MOVIPREP 100 g Solr  Generic drug:  peg 3350 powder      TAKE these medications        acetaminophen 500 MG tablet  Commonly known as:  TYLENOL  Take 500 mg by mouth every 6 (six) hours as needed for moderate pain.     acyclovir 800 MG tablet  Commonly known as:  ZOVIRAX  Take 800 mg by mouth 3 (three) times daily as needed (sores on mouth.).     almotriptan 12.5 MG tablet  Commonly known as:  AXERT  TAKE 1 TABLET (12.5 MG TOTAL) BY MOUTH AS NEEDED FOR MIGRAINE (MAX 2 TABS PER WEEK).     bisacodyl 5 MG EC  tablet  Commonly known as:  DULCOLAX  Take 10 mg by mouth daily as needed for mild constipation.     cyclobenzaprine 10 MG tablet  Commonly known as:  FLEXERIL  Take 10 mg by mouth 3 (three) times daily.     docusate sodium 100 MG capsule  Commonly known as:  COLACE  Take 100 mg by mouth daily as needed for mild constipation.     EPIPEN 2-PAK 0.3 mg/0.3 mL Soaj injection  Generic drug:  EPINEPHrine  Inject 0.3 mg into the skin daily as needed (allergic reaction.). Reported on 12/06/2015     estradiol 0.1 MG/24HR patch  Commonly known as:  VIVELLE-DOT  Place 1 patch onto the skin 2 (two) times a week. Wed and Saturday.     fentaNYL 25 MCG/HR patch  Commonly known as:  DURAGESIC - dosed mcg/hr  Place 1 patch (25 mcg total) onto the skin every 3 (three) days.     FLUARIX QUADRIVALENT 0.5 ML injection  Generic drug:  Influenza vac split quadrivalent PF  TO BE ADMINISTERED BY PHARMACIST FOR IMMUNIZATION     furosemide 40 MG tablet  Commonly known as:  LASIX  Take 40-80 mg by mouth daily as needed for fluid or edema.     gabapentin 300 MG capsule  Commonly known as:  NEURONTIN  Take 600 mg by mouth at bedtime.     HYDROcodone-acetaminophen 5-325 MG tablet  Commonly  known as:  NORCO/VICODIN  Take 1 tablet by mouth every 6 (six) hours as needed for moderate pain.     hyoscyamine 0.125 MG tablet  Commonly known as:  LEVSIN, ANASPAZ  Take 0.125 mg by mouth every 4 (four) hours as needed for bladder spasms or cramping.     lidocaine 2 % solution  Commonly known as:  XYLOCAINE  Use as directed 20 mLs in the mouth or throat daily as needed for mouth pain.     LORazepam 1 MG tablet  Commonly known as:  ATIVAN  Take 1 mg by mouth 3 (three) times daily.     potassium chloride SA 20 MEQ tablet  Commonly known as:  K-DUR,KLOR-CON  Take 20 mEq by mouth 2 (two) times daily.     PROAIR HFA 108 (90 Base) MCG/ACT inhaler  Generic drug:  albuterol  Inhale 2 puffs into the lungs every  4 (four) hours as needed for wheezing or shortness of breath.     promethazine 25 MG tablet  Commonly known as:  PHENERGAN  Take 25 mg by mouth every 6 (six) hours as needed for nausea or vomiting.     Trospium Chloride 60 MG Cp24  Take 1 capsule by mouth every morning.     Vitamin D (Ergocalciferol) 50000 units Caps capsule  Commonly known as:  DRISDOL  Take 50,000 Units by mouth every 7 (seven) days.     zolmitriptan 5 MG disintegrating tablet  Commonly known as:  ZOMIG-ZMT  Take 1 tablet (5 mg total) by mouth as needed for migraine (May use up to 2 tabs per week).        Followup:      Follow-up Information    Follow up with Alexis Frock, MD On 01/17/2016.   Specialty:  Urology   Why:  at 12:30.   Contact information:   [address] [place] [address] [phone]

## 2016-01-06 ENCOUNTER — Other Ambulatory Visit: Payer: 59

## 2016-01-06 ENCOUNTER — Ambulatory Visit: Payer: 59 | Admitting: Hematology and Oncology

## 2016-01-12 ENCOUNTER — Telehealth: Payer: Self-pay

## 2016-01-12 NOTE — Telephone Encounter (Signed)
FYI- pt called to let us know that her surgery was successful removing her kidney. Dr. Tresa Moore called her back with pathology results and her lymph nodes came back positive. She states that she did not like the Dilaudid she received in the hospital, so she is back to taking her Norco and fentanyl patch. She has enough to last her until her next appt with [facility]. She has a post-op appt on 01/17/16 with Dr. Tresa Moore.

## 2016-01-18 ENCOUNTER — Telehealth: Payer: Self-pay | Admitting: *Deleted

## 2016-01-18 MED ORDER — HYDROCODONE-ACETAMINOPHEN 5-325 MG PO TABS: 1.0000 | ORAL_TABLET | Freq: Three times a day (TID) | ORAL | Status: DC | PRN

## 2016-01-18 NOTE — Telephone Encounter (Signed)
Needs a refill on her hydrocodone.  She has #9.  She has #4 of her fentanyl patches so she will be ok until her 01/31/16 appt. Rx printed for Naaman Plummer to sign

## 2016-01-19 NOTE — Telephone Encounter (Signed)
Patient notified, says she will come tomorrow

## 2016-01-20 ENCOUNTER — Ambulatory Visit: Payer: 59 | Admitting: Neurology

## 2016-01-20 ENCOUNTER — Ambulatory Visit: Payer: 59 | Admitting: Hematology and Oncology

## 2016-01-20 ENCOUNTER — Other Ambulatory Visit: Payer: 59

## 2016-01-31 ENCOUNTER — Encounter: Payer: 59 | Attending: Physical Medicine and Rehabilitation | Admitting: Physical Medicine & Rehabilitation

## 2016-01-31 ENCOUNTER — Encounter: Payer: Self-pay | Admitting: Physical Medicine & Rehabilitation

## 2016-01-31 ENCOUNTER — Telehealth: Payer: Self-pay | Admitting: Hematology and Oncology

## 2016-01-31 VITALS — BP 132/69 | HR 106

## 2016-01-31 DIAGNOSIS — G43519 Persistent migraine aura without cerebral infarction, intractable, without status migrainosus: Secondary | ICD-10-CM

## 2016-01-31 DIAGNOSIS — M5136 Other intervertebral disc degeneration, lumbar region: Secondary | ICD-10-CM | POA: Insufficient documentation

## 2016-01-31 DIAGNOSIS — Z79899 Other long term (current) drug therapy: Secondary | ICD-10-CM | POA: Insufficient documentation

## 2016-01-31 DIAGNOSIS — G894 Chronic pain syndrome: Secondary | ICD-10-CM | POA: Diagnosis present

## 2016-01-31 DIAGNOSIS — M4716 Other spondylosis with myelopathy, lumbar region: Secondary | ICD-10-CM | POA: Insufficient documentation

## 2016-01-31 DIAGNOSIS — M797 Fibromyalgia: Secondary | ICD-10-CM | POA: Insufficient documentation

## 2016-01-31 DIAGNOSIS — Z5181 Encounter for therapeutic drug level monitoring: Secondary | ICD-10-CM | POA: Insufficient documentation

## 2016-01-31 DIAGNOSIS — F0781 Postconcussional syndrome: Secondary | ICD-10-CM | POA: Diagnosis present

## 2016-01-31 DIAGNOSIS — M47812 Spondylosis without myelopathy or radiculopathy, cervical region: Secondary | ICD-10-CM

## 2016-01-31 DIAGNOSIS — M654 Radial styloid tenosynovitis [de Quervain]: Secondary | ICD-10-CM

## 2016-01-31 MED ORDER — FENTANYL 25 MCG/HR TD PT72: 25.0000 ug | MEDICATED_PATCH | TRANSDERMAL | Status: DC

## 2016-01-31 MED ORDER — HYDROCODONE-ACETAMINOPHEN 5-325 MG PO TABS: 1.0000 | ORAL_TABLET | Freq: Three times a day (TID) | ORAL | Status: DC | PRN

## 2016-01-31 NOTE — Telephone Encounter (Signed)
s.w. pt and advised on 5.23 appt....pt ok and aware

## 2016-01-31 NOTE — Progress Notes (Signed)
Patty Salas is here for trigger point injections  After informed consent and preparation of the skin with isopropyl alcohol, I injected the left L1 paraspinals and the right T8, L1, L3 paraspinals each with 2cc of 1% lidocaine. The patient tolerated well, and no complications were experienced. Post-injection instructions were provided. Rx'es were provided . I'll see her back next month

## 2016-01-31 NOTE — Patient Instructions (Signed)
  PLEASE CALL ME WITH ANY PROBLEMS OR QUESTIONS (#[phone]).      

## 2016-02-05 LAB — TOXASSURE SELECT,+ANTIDEPR,UR

## 2016-02-09 NOTE — Progress Notes (Signed)
Urine drug screen for this encounter is consistent for prescribed medication 

## 2016-02-22 ENCOUNTER — Ambulatory Visit (HOSPITAL_BASED_OUTPATIENT_CLINIC_OR_DEPARTMENT_OTHER): Payer: 59 | Admitting: Hematology and Oncology

## 2016-02-22 ENCOUNTER — Encounter: Payer: Self-pay | Admitting: Hematology and Oncology

## 2016-02-22 ENCOUNTER — Telehealth: Payer: Self-pay | Admitting: Hematology and Oncology

## 2016-02-22 VITALS — BP 126/53 | HR 101 | Temp 98.2°F | Resp 18 | Ht 65.5 in | Wt 132.8 lb

## 2016-02-22 DIAGNOSIS — Z85528 Personal history of other malignant neoplasm of kidney: Secondary | ICD-10-CM | POA: Diagnosis not present

## 2016-02-22 DIAGNOSIS — C8206 Follicular lymphoma grade I, intrapelvic lymph nodes: Secondary | ICD-10-CM | POA: Insufficient documentation

## 2016-02-22 HISTORY — DX: Follicular lymphoma grade i, intrapelvic lymph nodes: C82.06

## 2016-02-22 NOTE — Assessment & Plan Note (Signed)
She has recovered fully from recent surgery. She is currently on surveillance program under the urologist.

## 2016-02-22 NOTE — Progress Notes (Signed)
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 Marlou Starks took out a lymph node in the right groin   01/01/2014 Imaging Ct showed lymphadenopathy   01/26/2014 PET scan Hypermetabolic retroperitoneal, periportal, and mesenteric lymph nodes is concerning for lymphoproliferative process.   03/06/2014 Pathology Results Accession: [id number] right groin LN biopsy came back benign   06/02/2014 PET scan Similar appearance of hypermetabolic retroperitoneal, periportal and mesenteric lymph nodes. Low grade lymphoproliferative disorder cannot be excluded.   12/01/2014 Imaging Mildly enlarged upper abdominal/ retroperitoneal lymph nodes, measuring up to 13 mm, stable versus minimally increased. No evidence of splenomegaly.   12/29/2015 Pathology Results Accession: [id number] LN biopsy of periaortic region was positive for low grade folliculr lymphoma. Leftf kidney specimen showed renal cell carcinoma   12/29/2015 Surgery She underwent robotic-assisted laparoscopic left radical nephrectomy. Left retroperitoneal lymph node dissection.    INTERVAL HISTORY: Please see below for problem oriented charting. She returns to review recent test results and treatment recommendation regarding lymphoma. She has fully recovered from recent surgery. She continues to have chronic pain, managed appropriately by her pain management team. She thought she self palpated a lump over the left supraclavicular region but it does not bother her. She denies night sweats, anorexia or weight loss.  REVIEW OF SYSTEMS:   Constitutional: Denies fevers, chills or abnormal weight loss Eyes:  Denies blurriness of vision Ears, nose, mouth, throat, and face: Denies mucositis or sore throat Respiratory: Denies cough, dyspnea or wheezes Cardiovascular: Denies palpitation, chest discomfort or lower extremity swelling Gastrointestinal:  Denies nausea, heartburn or change in bowel habits Skin: Denies abnormal skin rashes Neurological:Denies numbness, tingling or new weaknesses Behavioral/Psych: Mood is stable, no new changes  All other systems were reviewed with the patient and are negative.  I have reviewed the past medical history, past surgical history, social history and family history with the patient and they are unchanged from previous note.  ALLERGIES:  is allergic to cymbalta; divalproex sodium; duract; hydrochlorothiazide; imitrex; latex; mellaril; olanzapine; penicillins; topamax; aripiprazole; aspirin; dalmane; darifenacin hydrobromide er; metoclopramide hcl; seroquel; statins; stelazine; thorazine; abilify; alka-seltzer; allopurinol; biofreeze; capzasin; colchicine; cough drops; diflucan; iohexol; ivp dye; lyrica; metamucil; miralax; myrbetriq; nsaids; other; potassium-containing compounds; prednisone; reglan; renografin; risperdal; seroquel; simvastatin; urocit - k; zyprexa; avelox; barium-containing compounds; diclofenac; doxycycline; e-mycin; fiorinal; flagyl; lamictal; pregabalin; risperidone; and toradol.  MEDICATIONS:  Current Outpatient Prescriptions  Medication Sig Dispense Refill  . acetaminophen (TYLENOL) 500 MG tablet Take 500 mg by mouth every 6 (six) hours as needed for moderate pain.    Marland Kitchen acyclovir (ZOVIRAX) 800 MG tablet Take 800 mg by mouth 3 (three) times daily as needed (sores on mouth.).     Marland Kitchen almotriptan (AXERT) 12.5 MG tablet TAKE 1 TABLET (12.5 MG TOTAL) BY MOUTH AS NEEDED FOR MIGRAINE (MAX 2 TABS PER WEEK). 24 tablet 1  . bisacodyl (DULCOLAX) 5 MG EC tablet Take 10 mg by mouth daily as needed for mild constipation.     . cyclobenzaprine (FLEXERIL) 10 MG  tablet Take 10 mg by mouth 3 (three) times daily.    Marland Kitchen docusate sodium (COLACE) 100 MG capsule Take 100 mg by mouth daily as needed for mild constipation.     Marland Kitchen  EPIPEN 2-PAK 0.3 MG/0.3ML SOAJ injection Inject 0.3 mg into the skin daily as needed (allergic reaction.). Reported on 12/06/2015  0  . estradiol (VIVELLE-DOT) 0.1 MG/24HR Place 1 patch onto the skin 2 (two) times a week. Wed and Saturday.    . fentaNYL (DURAGESIC - DOSED MCG/HR) 25 MCG/HR patch Place 1 patch (25 mcg total) onto the skin every 3 (three) days. 10 patch 0  . FLUARIX QUADRIVALENT 0.5 ML injection TO BE ADMINISTERED BY PHARMACIST FOR IMMUNIZATION  0  . furosemide (LASIX) 40 MG tablet Take 40-80 mg by mouth daily as needed for fluid or edema.     . gabapentin (NEURONTIN) 300 MG capsule Take 600 mg by mouth at bedtime.    Marland Kitchen HYDROcodone-acetaminophen (NORCO/VICODIN) 5-325 MG tablet Take 1 tablet by mouth every 8 (eight) hours as needed for moderate pain. 90 tablet 0  . hyoscyamine (LEVSIN, ANASPAZ) 0.125 MG tablet Take 0.125 mg by mouth every 4 (four) hours as needed for bladder spasms or cramping.     . lidocaine (XYLOCAINE) 2 % solution Use as directed 20 mLs in the mouth or throat daily as needed for mouth pain.     Marland Kitchen LORazepam (ATIVAN) 1 MG tablet Take 1 mg by mouth 3 (three) times daily.    . potassium chloride SA (K-DUR,KLOR-CON) 20 MEQ tablet Take 20 mEq by mouth 2 (two) times daily.    Marland Kitchen PROAIR HFA 108 (90 BASE) MCG/ACT inhaler Inhale 2 puffs into the lungs every 4 (four) hours as needed for wheezing or shortness of breath.     . promethazine (PHENERGAN) 25 MG tablet Take 25 mg by mouth every 6 (six) hours as needed for nausea or vomiting.     . Trospium Chloride 60 MG CP24 Take 1 capsule by mouth every morning.     . Vitamin D, Ergocalciferol, (DRISDOL) 50000 UNITS CAPS capsule Take 50,000 Units by mouth every 7 (seven) days.     Marland Kitchen zolmitriptan (ZOMIG-ZMT) 5 MG disintegrating tablet Take 1 tablet (5 mg total) by mouth as  needed for migraine (May use up to 2 tabs per week). 27 tablet 1   No current facility-administered medications for this visit.    PHYSICAL EXAMINATION: ECOG PERFORMANCE STATUS: 0 - Asymptomatic  Filed Vitals:   02/22/16 0907  BP: 126/53  Pulse: 101  Temp: 98.2 F (36.8 C)  Resp: 18   Filed Weights   02/22/16 0907  Weight: 132 lb 12.8 oz (60.238 kg)    GENERAL:alert, no distress and comfortable SKIN: skin color, texture, turgor are normal, no rashes or significant lesions EYES: normal, Conjunctiva are pink and non-injected, sclera clear OROPHARYNX:no exudate, no erythema and lips, buccal mucosa, and tongue normal  NECK: supple, thyroid normal size, non-tender, without nodularity LYMPH:  She has palpable lymphadenopathy over the left supraclavicular region LUNGS: clear to auscultation and percussion with normal breathing effort HEART: regular rate & rhythm and no murmurs and no lower extremity edema ABDOMEN:abdomen soft, non-tender and normal bowel sounds. Well-healed surgical scar Musculoskeletal:no cyanosis of digits and no clubbing  NEURO: alert & oriented x 3 with fluent speech, no focal motor/sensory deficits  LABORATORY DATA:  I have reviewed the data as listed    Component Value Date/Time   NA 141 12/30/2015 0444   NA 140 12/01/2014 1039   K 3.2* 12/30/2015 0444   K 2.8* 12/01/2014 1039   CL 103 12/30/2015 0444   CO2 30 12/30/2015 0444   CO2 30* 12/01/2014 1039   GLUCOSE 118*  12/30/2015 0444   GLUCOSE 143* 12/01/2014 1039   BUN 9 12/30/2015 0444   BUN 14.3 12/01/2014 1039   CREATININE 1.03* 12/30/2015 0444   CREATININE 0.8 12/01/2014 1039   CALCIUM 8.7* 12/30/2015 0444   CALCIUM 10.0 12/01/2014 1039   PROT 8.4* 12/01/2014 1039   PROT 8.7* 11/04/2010 1505   ALBUMIN 4.1 12/01/2014 1039   ALBUMIN 4.5 11/04/2010 1505   AST 23 12/01/2014 1039   AST 44* 11/04/2010 1505   ALT 14 12/01/2014 1039   ALT 37* 11/04/2010 1505   ALKPHOS 71 12/01/2014 1039    ALKPHOS 90 11/04/2010 1505   BILITOT 0.22 12/01/2014 1039   BILITOT 0.3 11/04/2010 1505   GFRNONAA 57* 12/30/2015 0444   GFRAA >60 12/30/2015 0444    No results found for: SPEP, UPEP  Lab Results  Component Value Date   WBC 7.9 12/28/2015   NEUTROABS 5.8 12/01/2014   HGB 9.9* 12/30/2015   HCT 30.1* 12/30/2015   MCV 91.4 12/28/2015   PLT 368 12/28/2015      Chemistry      Component Value Date/Time   NA 141 12/30/2015 0444   NA 140 12/01/2014 1039   K 3.2* 12/30/2015 0444   K 2.8* 12/01/2014 1039   CL 103 12/30/2015 0444   CO2 30 12/30/2015 0444   CO2 30* 12/01/2014 1039   BUN 9 12/30/2015 0444   BUN 14.3 12/01/2014 1039   CREATININE 1.03* 12/30/2015 0444   CREATININE 0.8 12/01/2014 1039      Component Value Date/Time   CALCIUM 8.7* 12/30/2015 0444   CALCIUM 10.0 12/01/2014 1039   ALKPHOS 71 12/01/2014 1039   ALKPHOS 90 11/04/2010 1505   AST 23 12/01/2014 1039   AST 44* 11/04/2010 1505   ALT 14 12/01/2014 1039   ALT 37* 11/04/2010 1505   BILITOT 0.22 12/01/2014 1039   BILITOT 0.3 11/04/2010 1505      ASSESSMENT & PLAN:  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 I have reviewed her case at the hematology tumor board last month. Her current guidelines, I recommend PET imaging for staging. Previously, I felt that she was at stage II. Today, I palpated a small lymph node in the left supraclavicular region that could represent possible lymphoma involvement. I will schedule that to be done in 2 weeks and see her back to review test results and further treatment recommendation.  History of kidney cancer She has recovered fully from recent surgery. She is currently on surveillance program under the urologist.   Orders Placed This Encounter  Procedures  . NM PET Image Restag (PS) Skull Base To Thigh    Standing Status: Future     Number of Occurrences:      Standing Expiration Date: 04/23/2017     Order Specific Question:  Reason for Exam (SYMPTOM  OR DIAGNOSIS REQUIRED)    Answer:  staging lymphoma    Order Specific Question:  Preferred imaging location?    Answer:  [facility]   All questions were answered. The patient knows to call the clinic with any problems, questions or concerns. No barriers to learning was detected. I spent 20 minutes counseling the patient face to face. The total time spent in the appointment was 25 minutes and more than 50% was on counseling and review of test results     [facility], [place], MD 02/22/2016 10:03 AM

## 2016-02-22 NOTE — Assessment & Plan Note (Signed)
I reviewed the current guidelines with her. I have reviewed her case at the hematology tumor board last month. Her current guidelines, I recommend PET imaging for staging. Previously, I felt that she was at stage II. Today, I palpated a small lymph node in the left supraclavicular region that could represent possible lymphoma involvement. I will schedule that to be done in 2 weeks and see her back to review test results and further treatment recommendation.

## 2016-02-22 NOTE — Telephone Encounter (Signed)
Gave and printed appt sched and avs for pt for June °

## 2016-03-01 ENCOUNTER — Encounter: Payer: 59 | Admitting: Physical Medicine & Rehabilitation

## 2016-03-03 ENCOUNTER — Encounter: Payer: Self-pay | Admitting: Registered Nurse

## 2016-03-03 ENCOUNTER — Encounter: Payer: 59 | Attending: Physical Medicine and Rehabilitation | Admitting: Registered Nurse

## 2016-03-03 VITALS — BP 127/71 | HR 108 | Resp 16

## 2016-03-03 DIAGNOSIS — F0781 Postconcussional syndrome: Secondary | ICD-10-CM | POA: Insufficient documentation

## 2016-03-03 DIAGNOSIS — M5136 Other intervertebral disc degeneration, lumbar region: Secondary | ICD-10-CM | POA: Insufficient documentation

## 2016-03-03 DIAGNOSIS — Z5181 Encounter for therapeutic drug level monitoring: Secondary | ICD-10-CM | POA: Diagnosis present

## 2016-03-03 DIAGNOSIS — M797 Fibromyalgia: Secondary | ICD-10-CM | POA: Insufficient documentation

## 2016-03-03 DIAGNOSIS — G43519 Persistent migraine aura without cerebral infarction, intractable, without status migrainosus: Secondary | ICD-10-CM | POA: Insufficient documentation

## 2016-03-03 DIAGNOSIS — G894 Chronic pain syndrome: Secondary | ICD-10-CM | POA: Insufficient documentation

## 2016-03-03 DIAGNOSIS — M4716 Other spondylosis with myelopathy, lumbar region: Secondary | ICD-10-CM | POA: Insufficient documentation

## 2016-03-03 DIAGNOSIS — Z79899 Other long term (current) drug therapy: Secondary | ICD-10-CM

## 2016-03-03 MED ORDER — FENTANYL 25 MCG/HR TD PT72: 25.0000 ug | MEDICATED_PATCH | TRANSDERMAL | Status: DC

## 2016-03-03 NOTE — Progress Notes (Signed)
Subjective:    Patient ID: Patty Salas, female    DOB: [birth year], 60 y.o.   MRN: [id number]  HPI: [name] is a 60 year old female who returns for follow up for chronic pain and medication refill. She states her pain is located in her bilateral shoulders and lower back mainly right side. She rates her pain 9. Her current exercise regime is performing stretching exercises with bands and walking.  Pain Inventory Average Pain 9 Pain Right Now 9 My pain is constant, sharp, burning, stabbing and aching  In the last 24 hours, has pain interfered with the following? General activity 10 Relation with others 10 Enjoyment of life 10 What TIME of day is your pain at its worst? night Sleep (in general) Fair  Pain is worse with: walking, bending, sitting, inactivity, standing and some activites Pain improves with: rest, heat/ice, therapy/exercise, medication and injections Relief from Meds: 5  Mobility walk without assistance walk with assistance use a cane use a walker how many minutes can you walk? 5 ability to climb steps?  yes do you drive?  yes needs help with transfers transfers alone Do you have any goals in this area?  yes  Function I need assistance with the following:  dressing, toileting, meal prep, household duties and shopping Do you have any goals in this area?  yes  Neuro/Psych bladder control problems bowel control problems weakness numbness tremor tingling spasms dizziness confusion depression anxiety  Prior Studies Any changes since last visit?  yes  Physicians involved in your care Any changes since last visit?  yes   Family History  Problem Relation Age of Onset  . Cancer Mother   . Migraines Mother   . Heart disease Father   . Hypertension Father   . Cancer Father     colon  . Hypertension Brother   . Migraines Brother   . Hypertension Brother   . Migraines Brother   . Migraines Daughter    Social History   Social  History  . Marital Status: Married    Spouse Name: N/A  . Number of Children: 1  . Years of Education: COLLEGE1   Occupational History  . HOUSEWIFE    Social History Main Topics  . Smoking status: Never Smoker   . Smokeless tobacco: Never Used  . Alcohol Use: No  . Drug Use: No  . Sexual Activity: Not Asked   Other Topics Concern  . None   Social History Narrative   Patient is right handed.   Patient drinks caffeine occasionally.   Past Surgical History  Procedure Laterality Date  . Neck surgery  2002  . Foot surgery      lt  . Cystoscopy    . Abdominal adhesion surgery    . Colonoscopy    . Cholecystectomy  1985  . Tonsillectomy  1976  . Colonoscopy    . Lithotripsy    . Lymph node biopsy Right 03/06/2014    Procedure: right groin LYMPH NODE BIOPSY;  Surgeon: Merrie Roof, MD;  Location: [facility];  Service: General;  Laterality: Right;  . Cervical disc surgery  06/04/2001    with fusion  .  kidney stones    . Jj stent  07/2002    with ureteroscopy, cystoscopy and then removal of stent in office  . Abdominal hysterectomy  1995    partial- hemmoraged after surgery-stitch came loose  . Appendectomy  1993    hemmoraged  after surgery- stitch came loose  . Robot assisted laparoscopic nephrectomy Left 12/29/2015    Procedure: XI ROBOTIC ASSISTED LEFT LAPAROSCOPIC NEPHRECTOMY;  Surgeon: Alexis Frock, MD;  Location: [facility];  Service: Urology;  Laterality: Left;  . Lymphadenectomy N/A 12/29/2015    Procedure: RETROPERITONEAL LYMPHADENECTOMY;  Surgeon: Alexis Frock, MD;  Location: [facility];  Service: Urology;  Laterality: N/A;   Past Medical History  Diagnosis Date  . Myalgia and myositis, unspecified   . Fibromyalgia   . Anxiety   . Depression   . Cervical facet syndrome   . Calcifying tendinitis of shoulder   . Carpal tunnel syndrome   . Thoracic radiculopathy   . Hypertension   . Bipolar affective ([place])   . Gout   . Asthma   . Renal calculi    . Diverticulosis   . PTSD (post-traumatic stress disorder)   . Full dentures   . Wears glasses   . Falls   . Chronic pain syndrome   . Arthritis   . IBS (irritable bowel syndrome)     with diarrhea  . Headache(784.0)     migraines  . Herpesviral infection   . Vitamin D deficiency   . Restless legs syndrome (RLS)   . Lymphadenopathy   . Complication of anesthesia   . PONV (postoperative nausea and vomiting)   . 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  Gastrointestinal: Positive for nausea, vomiting, abdominal pain and constipation.  All other systems reviewed and are negative.      Objective:   Physical Exam  Constitutional: She is oriented to person, place, and time. She appears well-developed and well-nourished.  HENT:  Head: Normocephalic and atraumatic.  Neck: Normal range of motion. Neck supple.  Cardiovascular: Normal rate and regular rhythm.   Pulmonary/Chest: Effort normal and breath sounds normal.  Musculoskeletal:  Normal Muscle Bulk and Muscle Testing Reveals: Upper Extremities: Full ROM and Muscle Strength 4/5 Bilateral AC Joint Tenderness Left > Right Thoracic Paraspinal Tenderness: T-1- T-3 Lumbar Paraspinal Tenderness: L-3- L-5 Mainly Right Side Lower Extremities: Full ROM and Muscle Strength 5/5 Arises from chair with ease Narrow Based Gait   Neurological: She is alert and oriented to person, place, and time.  Skin: Skin is warm and dry.  Psychiatric: She has a normal mood and affect.  Nursing note and vitals reviewed.         Assessment & Plan:  1.  History of fibromyalgia with myofascial pain and multiple trigger points. Continue with Heat and exercise Regime. Continue with gabapentin.  2. Chronic migraine headaches.Continue to Monitor 3. Lumbar degenerative disk disease, L4-5 :  Refilled: Fentanyl Patch 25 mcg one patch every three days #10. Continue Hydrocodone 5/325 mg one tablet every 6 hours as needed #90. No script given for hydrocodone 4. History of Left Renal Mass: S/P Left Laparoscopic Nephrectomy 12/29/2015. Urology Following  5. 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  20 minutes of face to face patient care time was spent during this visit. All questions were encouraged  and answered.   F/U in 1 month

## 2016-03-06 ENCOUNTER — Ambulatory Visit (HOSPITAL_COMMUNITY)
Admission: RE | Admit: 2016-03-06 | Discharge: 2016-03-06 | Disposition: A | Discharge status: Home / Self Care | Payer: 59 | Source: Ambulatory Visit | Attending: Hematology and Oncology | Admitting: Hematology and Oncology

## 2016-03-06 DIAGNOSIS — C8206 Follicular lymphoma grade I, intrapelvic lymph nodes: Secondary | ICD-10-CM

## 2016-03-06 DIAGNOSIS — Z905 Acquired absence of kidney: Secondary | ICD-10-CM | POA: Insufficient documentation

## 2016-03-06 LAB — GLUCOSE, CAPILLARY: GLUCOSE-CAPILLARY: 107 mg/dL — AB (ref 65–99)

## 2016-03-06 MED ORDER — FLUDEOXYGLUCOSE F - 18 (FDG) INJECTION
6.5500 | Freq: Once | INTRAVENOUS | Status: AC | PRN
  Administered 2016-03-06: 6.55 via INTRAVENOUS

## 2016-03-07 ENCOUNTER — Ambulatory Visit: Payer: 59 | Admitting: Hematology and Oncology

## 2016-03-07 ENCOUNTER — Telehealth: Payer: Self-pay | Admitting: *Deleted

## 2016-03-07 ENCOUNTER — Other Ambulatory Visit: Payer: Self-pay | Admitting: *Deleted

## 2016-03-07 ENCOUNTER — Telehealth: Payer: Self-pay | Admitting: Hematology and Oncology

## 2016-03-07 NOTE — Telephone Encounter (Signed)
s.w. pt and cx appt....pt did not want to r/s yet will call back to r/s

## 2016-03-07 NOTE — Telephone Encounter (Signed)
OK I will defer to scheduler to reschedule

## 2016-03-07 NOTE — Telephone Encounter (Signed)
Pt left lengthy message explaining she cannot make it for her appt today basically due to Migraine.  The end of her message got cut off but it sounded like she needed a refill on something.  I called her back and left vm asking her to return call if she needs something else.  Informed pt we will have scheduler call her back w/ new appt d/t.  Triage nurse then called states just spoke w/ pt and she did not mention needing a refill.

## 2016-03-09 ENCOUNTER — Telehealth: Payer: Self-pay | Admitting: Hematology and Oncology

## 2016-03-09 NOTE — Telephone Encounter (Signed)
returned call and s.w. pt and r/s appt.Marland KitchenMarland KitchenMarland KitchenMarland Kitchenpt ok and aware of d.t

## 2016-03-15 ENCOUNTER — Encounter: Payer: 59 | Admitting: Physical Medicine & Rehabilitation

## 2016-03-21 ENCOUNTER — Ambulatory Visit: Payer: 59 | Admitting: Hematology and Oncology

## 2016-03-21 ENCOUNTER — Telehealth: Payer: Self-pay | Admitting: Hematology and Oncology

## 2016-03-21 NOTE — Telephone Encounter (Signed)
returned call and lvm to confirm cx appt for today...advised pt to call back to r/s

## 2016-03-22 ENCOUNTER — Encounter: Payer: 59 | Admitting: Physical Medicine & Rehabilitation

## 2016-03-29 ENCOUNTER — Telehealth: Payer: Self-pay | Admitting: Hematology and Oncology

## 2016-03-29 NOTE — Telephone Encounter (Signed)
Returned call and s.w. Pt and r/s cx appt....the patient ok and aware

## 2016-03-30 ENCOUNTER — Ambulatory Visit (HOSPITAL_BASED_OUTPATIENT_CLINIC_OR_DEPARTMENT_OTHER): Payer: 59 | Admitting: Hematology and Oncology

## 2016-03-30 ENCOUNTER — Encounter: Payer: Self-pay | Admitting: Hematology and Oncology

## 2016-03-30 ENCOUNTER — Telehealth: Payer: Self-pay | Admitting: Hematology and Oncology

## 2016-03-30 VITALS — BP 136/45 | HR 104 | Temp 98.6°F | Resp 18 | Ht 65.5 in | Wt 138.1 lb

## 2016-03-30 DIAGNOSIS — C8206 Follicular lymphoma grade I, intrapelvic lymph nodes: Secondary | ICD-10-CM | POA: Diagnosis not present

## 2016-03-30 DIAGNOSIS — Z85528 Personal history of other malignant neoplasm of kidney: Secondary | ICD-10-CM

## 2016-03-30 NOTE — Progress Notes (Signed)
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 Marlou Starks took out a lymph node in the right groin   01/01/2014 Imaging Ct showed lymphadenopathy   01/26/2014 PET scan Hypermetabolic retroperitoneal, periportal, and mesenteric lymph nodes is concerning for lymphoproliferative process.   03/06/2014 Pathology Results Accession: [id number] right groin LN biopsy came back benign   06/02/2014 PET scan Similar appearance of hypermetabolic retroperitoneal, periportal and mesenteric lymph nodes. Low grade lymphoproliferative disorder cannot be excluded.   12/01/2014 Imaging Mildly enlarged upper abdominal/ retroperitoneal lymph nodes, measuring up to 13 mm, stable versus minimally increased. No evidence of splenomegaly.   12/29/2015 Pathology Results Accession: [id number] LN biopsy of periaortic region was positive for low grade folliculr lymphoma. Leftf kidney specimen showed renal cell carcinoma   12/29/2015 Surgery She underwent robotic-assisted laparoscopic left radical nephrectomy. Left retroperitoneal lymph node dissection.   03/06/2016 PET scan PET Ct showed stable lymphadenopathy with no major change    INTERVAL HISTORY: Please see below for problem oriented charting. She returns for further follow-up. She had recent fever, resolved. She denies pain from recent surgery. She continues to have chronic musculoskeletal pain from other reasons. Denies recent changes in appetite or weight  REVIEW OF SYSTEMS:   Constitutional: Denies fevers, chills or abnormal weight loss Eyes: Denies blurriness of vision Ears, nose, mouth, throat, and face: Denies  mucositis or sore throat Respiratory: Denies cough, dyspnea or wheezes Cardiovascular: Denies palpitation, chest discomfort or lower extremity swelling Gastrointestinal:  Denies nausea, heartburn or change in bowel habits Skin: Denies abnormal skin rashes Lymphatics: Denies new lymphadenopathy or easy bruising Neurological:Denies numbness, tingling or new weaknesses Behavioral/Psych: Mood is stable, no new changes  All other systems were reviewed with the patient and are negative.  I have reviewed the past medical history, past surgical history, social history and family history with the patient and they are unchanged from previous note.  ALLERGIES:  is allergic to cymbalta; divalproex sodium; duract; hydrochlorothiazide; imitrex; latex; mellaril; olanzapine; penicillins; topamax; aripiprazole; aspirin; dalmane; darifenacin hydrobromide er; metoclopramide hcl; seroquel; statins; stelazine; thorazine; abilify; alka-seltzer; allopurinol; biofreeze; capzasin; colchicine; cough drops; diflucan; iohexol; ivp dye; lyrica; metamucil; miralax; myrbetriq; nsaids; other; potassium-containing compounds; prednisone; reglan; renografin; risperdal; seroquel; simvastatin; urocit - k; zyprexa; avelox; barium-containing compounds; diclofenac; doxycycline; e-mycin; fiorinal; flagyl; lamictal; pregabalin; risperidone; and toradol.  MEDICATIONS:  Current Outpatient Prescriptions  Medication Sig Dispense Refill  . acetaminophen (TYLENOL) 500 MG tablet Take 500 mg by mouth every 6 (six) hours as needed for moderate pain.    Marland Kitchen acyclovir (ZOVIRAX) 800 MG tablet Take 800 mg by mouth 3 (three) times daily as needed (sores on mouth.).     Marland Kitchen almotriptan (AXERT) 12.5 MG tablet TAKE 1 TABLET (12.5 MG TOTAL) BY MOUTH AS NEEDED FOR MIGRAINE (MAX 2 TABS PER WEEK). 24 tablet 1  . bisacodyl (DULCOLAX) 5 MG EC tablet Take 10 mg by mouth daily as needed for mild constipation.     . cyclobenzaprine (FLEXERIL) 10 MG tablet Take 10 mg  by mouth 3 (three) times daily.    Marland Kitchen docusate sodium (COLACE) 100 MG capsule Take 100 mg by mouth daily as needed for mild constipation.     Marland Kitchen EPIPEN 2-PAK  0.3 MG/0.3ML SOAJ injection Inject 0.3 mg into the skin daily as needed (allergic reaction.). Reported on 12/06/2015  0  . estradiol (VIVELLE-DOT) 0.1 MG/24HR Place 1 patch onto the skin 2 (two) times a week. Wed and Saturday.    . fentaNYL (DURAGESIC - DOSED MCG/HR) 25 MCG/HR patch Place 1 patch (25 mcg total) onto the skin every 3 (three) days. 10 patch 0  . FLUARIX QUADRIVALENT 0.5 ML injection TO BE ADMINISTERED BY PHARMACIST FOR IMMUNIZATION  0  . furosemide (LASIX) 40 MG tablet Take 40-80 mg by mouth daily as needed for fluid or edema.     . gabapentin (NEURONTIN) 300 MG capsule Take 600 mg by mouth at bedtime.    Marland Kitchen HYDROcodone-acetaminophen (NORCO/VICODIN) 5-325 MG tablet Take 1 tablet by mouth every 8 (eight) hours as needed for moderate pain. 90 tablet 0  . hyoscyamine (LEVSIN, ANASPAZ) 0.125 MG tablet Take 0.125 mg by mouth every 4 (four) hours as needed for bladder spasms or cramping.     . lidocaine (XYLOCAINE) 2 % solution Use as directed 20 mLs in the mouth or throat daily as needed for mouth pain.     Marland Kitchen LORazepam (ATIVAN) 1 MG tablet Take 1 mg by mouth 3 (three) times daily.    . potassium chloride SA (K-DUR,KLOR-CON) 20 MEQ tablet Take 20 mEq by mouth 2 (two) times daily.    Marland Kitchen PROAIR HFA 108 (90 BASE) MCG/ACT inhaler Inhale 2 puffs into the lungs every 4 (four) hours as needed for wheezing or shortness of breath.     . promethazine (PHENERGAN) 25 MG tablet Take 25 mg by mouth every 6 (six) hours as needed for nausea or vomiting.     . Trospium Chloride 60 MG CP24 Take 1 capsule by mouth every morning.     . Vitamin D, Ergocalciferol, (DRISDOL) 50000 UNITS CAPS capsule Take 50,000 Units by mouth every 7 (seven) days.     Marland Kitchen zolmitriptan (ZOMIG-ZMT) 5 MG disintegrating tablet Take 1 tablet (5 mg total) by mouth as needed for migraine  (May use up to 2 tabs per week). 27 tablet 1   No current facility-administered medications for this visit.    PHYSICAL EXAMINATION: ECOG PERFORMANCE STATUS: 0 - Asymptomatic  Filed Vitals:   03/30/16 1237  BP: 136/45  Pulse: 104  Temp: 98.6 F (37 C)  Resp: 18   Filed Weights   03/30/16 1237  Weight: 138 lb 1.6 oz (62.642 kg)    GENERAL:alert, no distress and comfortable SKIN: skin color, texture, turgor are normal, no rashes or significant lesions EYES: normal, Conjunctiva are pink and non-injected, sclera clear Musculoskeletal:no cyanosis of digits and no clubbing  NEURO: alert & oriented x 3 with fluent speech, no focal motor/sensory deficits  LABORATORY DATA:  I have reviewed the data as listed    Component Value Date/Time   NA 141 12/30/2015 0444   NA 140 12/01/2014 1039   K 3.2* 12/30/2015 0444   K 2.8* 12/01/2014 1039   CL 103 12/30/2015 0444   CO2 30 12/30/2015 0444   CO2 30* 12/01/2014 1039   GLUCOSE 118* 12/30/2015 0444   GLUCOSE 143* 12/01/2014 1039   BUN 9 12/30/2015 0444   BUN 14.3 12/01/2014 1039   CREATININE 1.03* 12/30/2015 0444   CREATININE 0.8 12/01/2014 1039   CALCIUM 8.7* 12/30/2015 0444   CALCIUM 10.0 12/01/2014 1039   PROT 8.4* 12/01/2014 1039   PROT 8.7* 11/04/2010 1505   ALBUMIN 4.1 12/01/2014 1039   ALBUMIN 4.5  11/04/2010 1505   AST 23 12/01/2014 1039   AST 44* 11/04/2010 1505   ALT 14 12/01/2014 1039   ALT 37* 11/04/2010 1505   ALKPHOS 71 12/01/2014 1039   ALKPHOS 90 11/04/2010 1505   BILITOT 0.22 12/01/2014 1039   BILITOT 0.3 11/04/2010 1505   GFRNONAA 57* 12/30/2015 0444   GFRAA >60 12/30/2015 0444    No results found for: SPEP, UPEP  Lab Results  Component Value Date   WBC 7.9 12/28/2015   NEUTROABS 5.8 12/01/2014   HGB 9.9* 12/30/2015   HCT 30.1* 12/30/2015   MCV 91.4 12/28/2015   PLT 368 12/28/2015      Chemistry      Component Value Date/Time   NA 141 12/30/2015 0444   NA 140 12/01/2014 1039   K 3.2*  12/30/2015 0444   K 2.8* 12/01/2014 1039   CL 103 12/30/2015 0444   CO2 30 12/30/2015 0444   CO2 30* 12/01/2014 1039   BUN 9 12/30/2015 0444   BUN 14.3 12/01/2014 1039   CREATININE 1.03* 12/30/2015 0444   CREATININE 0.8 12/01/2014 1039      Component Value Date/Time   CALCIUM 8.7* 12/30/2015 0444   CALCIUM 10.0 12/01/2014 1039   ALKPHOS 71 12/01/2014 1039   ALKPHOS 90 11/04/2010 1505   AST 23 12/01/2014 1039   AST 44* 11/04/2010 1505   ALT 14 12/01/2014 1039   ALT 37* 11/04/2010 1505   BILITOT 0.22 12/01/2014 1039   BILITOT 0.3 11/04/2010 1505       RADIOGRAPHIC STUDIES:I reviewed the recent PET CT with her I have personally reviewed the radiological images as listed and agreed with the findings in the report.    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 PET imaging showed low burden disease Previously, I felt that she was at stage II. She is not symptomatic. She does not have any symptoms to suggest the need for treatment. I recommend history, physical examination and blood work again in 6 months and imaging study in one year. She agreed with the plan of care  History of kidney cancer She has recovered fully from recent surgery. She is currently on surveillance program under the urologist.   Orders Placed This Encounter  Procedures  . CBC with Differential/Platelet    Standing Status: Future     Number of Occurrences:      Standing Expiration Date: 05/04/2017  . Comprehensive metabolic panel    Standing Status: Future     Number of Occurrences:      Standing Expiration Date: 05/04/2017  . Lactate dehydrogenase (LDH)    Standing Status: Future     Number of Occurrences:      Standing Expiration Date: 05/04/2017   All questions were answered. The patient knows to call the clinic with any problems, questions or concerns. No barriers to learning was detected. I spent 15 minutes  counseling the patient face to face. The total time spent in the appointment was 20 minutes and more than 50% was on counseling and review of test results     [facility], Kwadwo Taras, MD 03/30/2016 4:05 PM

## 2016-03-30 NOTE — Assessment & Plan Note (Signed)
I reviewed the current guidelines with her. PET imaging showed low burden disease Previously, I felt that she was at stage II. She is not symptomatic. She does not have any symptoms to suggest the need for treatment. I recommend history, physical examination and blood work again in 6 months and imaging study in one year. She agreed with the plan of care

## 2016-03-30 NOTE — Assessment & Plan Note (Signed)
She has recovered fully from recent surgery. She is currently on surveillance program under the urologist.

## 2016-03-30 NOTE — Telephone Encounter (Signed)
Gave patient avs report and appointments for December  °

## 2016-04-03 ENCOUNTER — Encounter: Payer: Self-pay | Admitting: Registered Nurse

## 2016-04-03 ENCOUNTER — Encounter: Payer: 59 | Attending: Physical Medicine and Rehabilitation | Admitting: Registered Nurse

## 2016-04-03 VITALS — BP 134/83 | HR 104 | Resp 14

## 2016-04-03 DIAGNOSIS — M4716 Other spondylosis with myelopathy, lumbar region: Secondary | ICD-10-CM | POA: Diagnosis present

## 2016-04-03 DIAGNOSIS — F0781 Postconcussional syndrome: Secondary | ICD-10-CM | POA: Diagnosis present

## 2016-04-03 DIAGNOSIS — Z79899 Other long term (current) drug therapy: Secondary | ICD-10-CM

## 2016-04-03 DIAGNOSIS — G43519 Persistent migraine aura without cerebral infarction, intractable, without status migrainosus: Secondary | ICD-10-CM | POA: Insufficient documentation

## 2016-04-03 DIAGNOSIS — M5136 Other intervertebral disc degeneration, lumbar region: Secondary | ICD-10-CM | POA: Diagnosis not present

## 2016-04-03 DIAGNOSIS — M51369 Other intervertebral disc degeneration, lumbar region without mention of lumbar back pain or lower extremity pain: Secondary | ICD-10-CM

## 2016-04-03 DIAGNOSIS — M797 Fibromyalgia: Secondary | ICD-10-CM | POA: Diagnosis present

## 2016-04-03 DIAGNOSIS — G894 Chronic pain syndrome: Secondary | ICD-10-CM | POA: Insufficient documentation

## 2016-04-03 DIAGNOSIS — Z5181 Encounter for therapeutic drug level monitoring: Secondary | ICD-10-CM | POA: Diagnosis present

## 2016-04-03 MED ORDER — HYDROCODONE-ACETAMINOPHEN 5-325 MG PO TABS: 1.0000 | ORAL_TABLET | Freq: Three times a day (TID) | ORAL | Status: DC | PRN

## 2016-04-03 MED ORDER — FENTANYL 25 MCG/HR TD PT72: 25.0000 ug | MEDICATED_PATCH | TRANSDERMAL | Status: DC

## 2016-04-03 NOTE — Progress Notes (Signed)
Subjective:    Patient ID: Patty Salas, female    DOB: Apr 29, 1956, 60 y.o.   MRN: [id number]  HPI: [name] is a 60 year old female who returns for follow up for chronic pain and medication refill. She states her pain is located in her neck and lower back. She rates her pain 10. Her current exercise regime is performing stretching exercises with bands and walking. Also states she has been diagnosed with Follicular Lymphoma, Dr. Alvy Bimler is following.  Pain Inventory Average Pain 10 Pain Right Now 10 My pain is constant, sharp, burning, stabbing and aching  In the last 24 hours, has pain interfered with the following? General activity 10 Relation with others 10 Enjoyment of life 10 What TIME of day is your pain at its worst? all Sleep (in general) Fair  Pain is worse with: walking, bending, sitting, inactivity, standing and some activites Pain improves with: rest, heat/ice, therapy/exercise, medication and injections Relief from Meds: 5  Mobility walk without assistance walk with assistance use a cane use a walker how many minutes can you walk? 5-10 ability to climb steps?  yes do you drive?  yes needs help with transfers transfers alone Do you have any goals in this area?  yes  Function I need assistance with the following:  dressing, toileting, meal prep, household duties and shopping Do you have any goals in this area?  yes  Neuro/Psych bladder control problems bowel control problems weakness numbness tremor tingling spasms dizziness confusion depression anxiety  Prior Studies Any changes since last visit?  no  Physicians involved in your care Any changes since last visit?  no   Family History  Problem Relation Age of Onset  . Cancer Mother   . Migraines Mother   . Heart disease Father   . Hypertension Father   . Cancer Father     colon  . Hypertension Brother   . Migraines Brother   . Hypertension Brother   . Migraines Brother     . Migraines Daughter    Social History   Social History  . Marital Status: Married    Spouse Name: N/A  . Number of Children: 1  . Years of Education: COLLEGE1   Occupational History  . HOUSEWIFE    Social History Main Topics  . Smoking status: Never Smoker   . Smokeless tobacco: Never Used  . Alcohol Use: No  . Drug Use: No  . Sexual Activity: Not Asked   Other Topics Concern  . None   Social History Narrative   Patient is right handed.   Patient drinks caffeine occasionally.   Past Surgical History  Procedure Laterality Date  . Neck surgery  2002  . Foot surgery      lt  . Cystoscopy    . Abdominal adhesion surgery    . Colonoscopy    . Cholecystectomy  1985  . Tonsillectomy  1976  . Colonoscopy    . Lithotripsy    . Lymph node biopsy Right 03/06/2014    Procedure: right groin LYMPH NODE BIOPSY;  Surgeon: Merrie Roof, MD;  Location: [facility];  Service: General;  Laterality: Right;  . Cervical disc surgery  06/04/2001    with fusion  .  kidney stones    . Jj stent  07/2002    with ureteroscopy, cystoscopy and then removal of stent in office  . Abdominal hysterectomy  1995    partial- hemmoraged after surgery-stitch came  loose  . Appendectomy  1993    hemmoraged after surgery- stitch came loose  . Robot assisted laparoscopic nephrectomy Left 12/29/2015    Procedure: XI ROBOTIC ASSISTED LEFT LAPAROSCOPIC NEPHRECTOMY;  Surgeon: Alexis Frock, MD;  Location: [facility];  Service: Urology;  Laterality: Left;  . Lymphadenectomy N/A 12/29/2015    Procedure: RETROPERITONEAL LYMPHADENECTOMY;  Surgeon: Alexis Frock, MD;  Location[facility];  Service: Urology;  Laterality: N/A;   Past Medical History  Diagnosis Date  . Myalgia and myositis, unspecified   . Fibromyalgia   . Anxiety   . Depression   . Cervical facet syndrome   . Calcifying tendinitis of shoulder   . Carpal tunnel syndrome   . Thoracic radiculopathy   . Hypertension   . Bipolar  affective ([place])   . Gout   . Asthma   . Renal calculi   . Diverticulosis   . PTSD (post-traumatic stress disorder)   . Full dentures   . Wears glasses   . Falls   . Chronic pain syndrome   . Arthritis   . IBS (irritable bowel syndrome)     with diarrhea  . Headache(784.0)     migraines  . Herpesviral infection   . Vitamin D deficiency   . Restless legs syndrome (RLS)   . Lymphadenopathy   . Complication of anesthesia   . PONV (postoperative nausea and vomiting)   . 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  Gastrointestinal: Positive for nausea, vomiting, abdominal pain, diarrhea and constipation.  Endocrine: Negative.   Genitourinary: Negative.   Musculoskeletal: Negative.   All other systems reviewed and are negative.      Objective:   Physical Exam  Constitutional: She is oriented to person, place, and time. She appears well-developed and well-nourished.  HENT:  Head: Normocephalic and atraumatic.  Neck: Normal range of motion. Neck supple.  Cervical Paraspinal Tenderness: C-5- C-6  Cardiovascular: Normal rate and regular rhythm.   Pulmonary/Chest: Effort normal and breath sounds normal.  Musculoskeletal:  Normal Muscle Bulk and Muscle Testing Reveals: Upper Extremities: Right:Full ROM and Muscle Strength 5/5 Left: Decreased ROM 90 Degrees and Muscle Strength 4/5 Thoracic and Lumbar Hypersensitivity Lower Extremities: Full ROM and Muscle Strength 5/5 Arises from Table with ease Narrow Based Gait  Neurological: She is alert and oriented to person, place, and time.  Skin: Skin  is warm and dry.  Psychiatric: She has a normal mood and affect.  Nursing note and vitals reviewed.         Assessment & Plan:  1. History of fibromyalgia with myofascial pain and multiple trigger points. Continue with Heat and exercise Regime. Continue with gabapentin.  2. Chronic migraine headaches.Continue to Monitor 3. Lumbar degenerative disk disease, L4-5 :  Refilled: Fentanyl Patch 25 mcg one patch every three days #10. Continue Hydrocodone 5/325 mg one tablet every 6 hours as needed #90.  4. History of Left Renal Mass: S/P Left Laparoscopic Nephrectomy 12/29/2015. Urology Following  5. 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  20 minutes  of face to face patient care time was spent during this visit. All questions were encouraged and answered.   F/U in 1 month

## 2016-05-04 ENCOUNTER — Encounter: Payer: Self-pay | Admitting: Registered Nurse

## 2016-05-04 ENCOUNTER — Encounter: Payer: 59 | Attending: Physical Medicine and Rehabilitation | Admitting: Registered Nurse

## 2016-05-04 VITALS — BP 112/71 | HR 110

## 2016-05-04 DIAGNOSIS — M5136 Other intervertebral disc degeneration, lumbar region: Secondary | ICD-10-CM | POA: Diagnosis present

## 2016-05-04 DIAGNOSIS — F0781 Postconcussional syndrome: Secondary | ICD-10-CM | POA: Diagnosis present

## 2016-05-04 DIAGNOSIS — G894 Chronic pain syndrome: Secondary | ICD-10-CM | POA: Insufficient documentation

## 2016-05-04 DIAGNOSIS — G43519 Persistent migraine aura without cerebral infarction, intractable, without status migrainosus: Secondary | ICD-10-CM | POA: Insufficient documentation

## 2016-05-04 DIAGNOSIS — M4716 Other spondylosis with myelopathy, lumbar region: Secondary | ICD-10-CM | POA: Insufficient documentation

## 2016-05-04 DIAGNOSIS — M797 Fibromyalgia: Secondary | ICD-10-CM | POA: Insufficient documentation

## 2016-05-04 DIAGNOSIS — Z5181 Encounter for therapeutic drug level monitoring: Secondary | ICD-10-CM | POA: Insufficient documentation

## 2016-05-04 DIAGNOSIS — Z79899 Other long term (current) drug therapy: Secondary | ICD-10-CM | POA: Diagnosis not present

## 2016-05-04 MED ORDER — HYDROCODONE-ACETAMINOPHEN 5-325 MG PO TABS: 1.0000 | ORAL_TABLET | Freq: Three times a day (TID) | ORAL | 0 refills | Status: DC | PRN

## 2016-05-04 MED ORDER — FENTANYL 25 MCG/HR TD PT72: 25.0000 ug | MEDICATED_PATCH | TRANSDERMAL | 0 refills | Status: DC

## 2016-05-04 NOTE — Progress Notes (Signed)
Subjective:    Patient ID: Patty Salas, female    DOB: [birth year], 60 y.o.   MRN: [id number]  HPI: Patty Salas is a 60 year old female who returns for follow up for chronic pain and medication refill. She states her pain is located in her neck and entire back. She rates her pain 10. Her current exercise regime is performing stretching exercises with bands and walking. Also states she has been diagnosed with Follicular Lymphoma, Dr. Alvy Bimler is following.  Pain Inventory Average Pain 9 Pain Right Now 10 My pain is constant, sharp, burning, stabbing and aching  In the last 24 hours, has pain interfered with the following? General activity 9 Relation with others 10 Enjoyment of life 9 What TIME of day is your pain at its worst? all Sleep (in general) Fair  Pain is worse with: walking, bending, sitting, inactivity, standing and some activites Pain improves with: rest, heat/ice, therapy/exercise, medication and injections Relief from Meds: 8  Mobility use a cane how many minutes can you walk? 5 ability to climb steps?  yes do you drive?  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  no  Physicians involved in your care Any changes since last visit?  no   Family History  Problem Relation Age of Onset  . Cancer Mother   . Migraines Mother   . Heart disease Father   . Hypertension Father   . Cancer Father     colon  . Hypertension Brother   . Migraines Brother   . Hypertension Brother   . Migraines Brother   . Migraines Daughter    Social History   Social History  . Marital status: Married    Spouse name: N/A  . Number of children: 1  . Years of  education: COLLEGE1   Occupational History  . HOUSEWIFE Unemployed   Social History Main Topics  . Smoking status: Never Smoker  . Smokeless tobacco: Never Used  . Alcohol use No  . Drug use: No  . Sexual activity: Not Asked   Other Topics Concern  . None   Social History Narrative   Patient is right handed.   Patient drinks caffeine occasionally.   Past Surgical History:  Procedure Laterality Date  .  kidney stones    . ABDOMINAL ADHESION SURGERY    . ABDOMINAL HYSTERECTOMY  1995   partial- hemmoraged after surgery-stitch came loose  . APPENDECTOMY  1993   hemmoraged after surgery- stitch came loose  . CERVICAL DISC SURGERY  06/04/2001   with fusion  . CHOLECYSTECTOMY  1985  . colonoscopy    . COLONOSCOPY    . CYSTOSCOPY    . FOOT SURGERY     lt  . jj stent  07/2002   with ureteroscopy, cystoscopy and then removal of stent in office  . LITHOTRIPSY    . LYMPH NODE BIOPSY Right 03/06/2014   Procedure: right groin LYMPH NODE BIOPSY;  Surgeon: Merrie Roof, MD;  Location: [facility];  Service: General;  Laterality: Right;  . LYMPHADENECTOMY N/A 12/29/2015   Procedure: RETROPERITONEAL LYMPHADENECTOMY;  Surgeon: Alexis Frock, MD;  [facility];  Service: Urology;  Laterality: N/A;  . NECK SURGERY  2002  .  ROBOT ASSISTED LAPAROSCOPIC NEPHRECTOMY Left 12/29/2015   Procedure: XI ROBOTIC ASSISTED LEFT LAPAROSCOPIC NEPHRECTOMY;  Surgeon: Alexis Frock, MD;  [facility];  Service: Urology;  Laterality: Left;  . TONSILLECTOMY  1976   Past Medical History:  Diagnosis Date  . Anxiety   . Arthritis   . Asthma   . Bipolar affective ([place])   . Calcifying tendinitis of shoulder   . Carpal tunnel syndrome   . Cervical facet syndrome   . Chronic pain syndrome   . Complication of anesthesia   . Depression   . Diverticulosis   . Falls   . Fibromyalgia   . 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 Full dentures   . Gout    . Headache(784.0)    migraines  . Herpesviral infection   . Hypertension   . IBS (irritable bowel syndrome)    with diarrhea  . Lymphadenopathy   . Myalgia and myositis, unspecified   . PONV (postoperative nausea and vomiting)   . PTSD (post-traumatic stress disorder)   . Renal calculi   . Restless legs syndrome (RLS)   . Thoracic radiculopathy   . Vitamin D deficiency   . Wears glasses    BP 112/71 (BP Location: Left Arm, Patient Position: Sitting, Cuff Size: Normal)   Pulse (!) 110   SpO2 96%   Opioid Risk Score:   Fall Risk Score:  `1  Depression screen PHQ 2/9  Depression screen [facility] 2/9 05/04/2016 03/03/2016 12/01/2015 06/25/2015  Decreased Interest 2 0 2 2  Down, Depressed, Hopeless 2 0 2 2  PHQ - 2 Score 4 0 4 4  Some recent data might be hidden    Review of Systems  Constitutional: Positive for appetite change, diaphoresis and unexpected weight change.  Gastrointestinal: Positive for abdominal pain, constipation, diarrhea, nausea and vomiting.  All other systems reviewed and are negative.      Objective:   Physical Exam  Constitutional: She is oriented to person, place, and time. She appears well-developed and well-nourished.  HENT:  Head: Normocephalic and atraumatic.  Neck: Normal range of motion. Neck supple.  Cardiovascular: Normal rate and regular rhythm.   Pulmonary/Chest: Effort normal and breath sounds normal.  Musculoskeletal:  Normal Muscle Bulk and Muscle Testing Reveals: Upper Extremities: Full ROM and Muscle Strength on the Right 4/5 and Left 3/5 Thoracic and Lumbar Hypersensitivity Lower extremities: Full ROM and Muscle Strength 5/5 Arises from table with ease Narrow based gait   Neurological: She is alert and oriented to person, place, and time.  Skin: Skin is warm and dry.  Nursing note and vitals reviewed.         Assessment & Plan:  1. History of fibromyalgia with myofascial pain and multiple trigger points. Continue with Heat and  exercise Regime. Continue with gabapentin. Scheduled for Trigger point injection on 05/17/16 with Dr. Naaman Plummer. 2. Chronic migraine headaches.Continue to Monitor 3. Lumbar degenerative disk disease, L4-5 :  Refilled: Fentanyl Patch 25 mcg one patch every three days #10. Continue Hydrocodone 5/325 mg one tablet every 6 hours as needed #90.  4. History of Left Renal Mass: S/P Left Laparoscopic Nephrectomy 12/29/2015. Urology Following  5. 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  20 minutes of face to face patient care time was spent during this visit. All questions were encouraged and answered.   F/U in 1 month

## 2016-05-17 ENCOUNTER — Encounter: Payer: Self-pay | Admitting: Physical Medicine & Rehabilitation

## 2016-05-17 ENCOUNTER — Encounter (HOSPITAL_BASED_OUTPATIENT_CLINIC_OR_DEPARTMENT_OTHER): Payer: 59 | Admitting: Physical Medicine & Rehabilitation

## 2016-05-17 VITALS — BP 122/75 | HR 100 | Resp 16

## 2016-05-17 DIAGNOSIS — M5136 Other intervertebral disc degeneration, lumbar region: Secondary | ICD-10-CM

## 2016-05-17 DIAGNOSIS — M797 Fibromyalgia: Secondary | ICD-10-CM

## 2016-05-17 DIAGNOSIS — G894 Chronic pain syndrome: Secondary | ICD-10-CM | POA: Diagnosis not present

## 2016-05-17 DIAGNOSIS — M51369 Other intervertebral disc degeneration, lumbar region without mention of lumbar back pain or lower extremity pain: Secondary | ICD-10-CM

## 2016-05-17 DIAGNOSIS — M4716 Other spondylosis with myelopathy, lumbar region: Secondary | ICD-10-CM

## 2016-05-17 MED ORDER — HYDROCODONE-ACETAMINOPHEN 5-325 MG PO TABS: 1.0000 | ORAL_TABLET | Freq: Three times a day (TID) | ORAL | 0 refills | Status: DC | PRN

## 2016-05-17 MED ORDER — FENTANYL 25 MCG/HR TD PT72: 25.0000 ug | MEDICATED_PATCH | TRANSDERMAL | 0 refills | Status: DC

## 2016-05-17 NOTE — Progress Notes (Signed)
Trigger point injection:  After informed consent and preparation of the skin with isopropyl alcohol, I injected the right trap, (upper/lower), right mid rhomboid, and right L4 paraspinals each with 2cc of 1% lidocaine. The patient tolerated well, and no complications were experienced. Post-injection instructions were provided.  Consider formal outpt PT to address posture and ongoing myofascial issues. In the meantime we discussed measures she can take at home to improve head forward posture

## 2016-05-17 NOTE — Patient Instructions (Signed)
PLEASE CALL ME WITH ANY PROBLEMS OR QUESTIONS ([phone])  

## 2016-05-23 ENCOUNTER — Other Ambulatory Visit: Payer: Self-pay | Admitting: Neurology

## 2016-05-23 MED ORDER — ALMOTRIPTAN MALATE 12.5 MG PO TABS: ORAL_TABLET | ORAL | 0 refills | Status: DC

## 2016-05-23 NOTE — Telephone Encounter (Signed)
Message sent to [place]. Pt was last seen 01/2015. Pt wants refill on  almotriptan (AXERT) 12.5 MG tablet Pt has appt in 07/2016 with Dr. Jannifer Franklin. Pt cancel appt in April for other medical issues.

## 2016-05-23 NOTE — Telephone Encounter (Signed)
Patient called back, previously spoke with [facility], scheduled appointment for October 19th (reschedule from April cancellation), patient requests refill of almotriptan (AXERT) 12.5 MG tablet to [facility] on [address]. Patient also advised the reason she cancelled April appointment was due to surgery for 2 types of cancer, Follicular Lymphoma and kidney cancer (entire left kidney removed).

## 2016-05-23 NOTE — Telephone Encounter (Signed)
I called patient. I called and up her prescription for the Axert. The patient has an appointment set up to see me in October. She reports some issues with vertigo recently.

## 2016-06-08 ENCOUNTER — Encounter: Payer: 59 | Attending: Physical Medicine and Rehabilitation | Admitting: Registered Nurse

## 2016-06-08 ENCOUNTER — Encounter: Payer: Self-pay | Admitting: Registered Nurse

## 2016-06-08 VITALS — BP 117/57 | HR 95 | Resp 95

## 2016-06-08 DIAGNOSIS — G43519 Persistent migraine aura without cerebral infarction, intractable, without status migrainosus: Secondary | ICD-10-CM

## 2016-06-08 DIAGNOSIS — Z79899 Other long term (current) drug therapy: Secondary | ICD-10-CM | POA: Diagnosis present

## 2016-06-08 DIAGNOSIS — G894 Chronic pain syndrome: Secondary | ICD-10-CM | POA: Insufficient documentation

## 2016-06-08 DIAGNOSIS — M5136 Other intervertebral disc degeneration, lumbar region: Secondary | ICD-10-CM

## 2016-06-08 DIAGNOSIS — Z5181 Encounter for therapeutic drug level monitoring: Secondary | ICD-10-CM | POA: Diagnosis present

## 2016-06-08 DIAGNOSIS — M4716 Other spondylosis with myelopathy, lumbar region: Secondary | ICD-10-CM

## 2016-06-08 DIAGNOSIS — F0781 Postconcussional syndrome: Secondary | ICD-10-CM | POA: Insufficient documentation

## 2016-06-08 DIAGNOSIS — M797 Fibromyalgia: Secondary | ICD-10-CM | POA: Diagnosis not present

## 2016-06-08 DIAGNOSIS — M51369 Other intervertebral disc degeneration, lumbar region without mention of lumbar back pain or lower extremity pain: Secondary | ICD-10-CM

## 2016-06-08 MED ORDER — FENTANYL 25 MCG/HR TD PT72: 25.0000 ug | MEDICATED_PATCH | TRANSDERMAL | 0 refills | Status: DC

## 2016-06-08 MED ORDER — HYDROCODONE-ACETAMINOPHEN 5-325 MG PO TABS: 1.0000 | ORAL_TABLET | Freq: Three times a day (TID) | ORAL | 0 refills | Status: DC | PRN

## 2016-06-08 NOTE — Progress Notes (Signed)
Subjective:    Patient ID: Patty Salas, female    DOB: March 19, 1956, 60 y.o.   MRN: [id number]  HPI: Patty Salas is a 60 year old female who returns for follow up for chronic pain and medication refill. She states her pain is located in her mid-lower back. She rates her pain 9. Her current exercise regime is performing stretching exercises with bands and walking.  She has been diagnosed with Follicular Lymphoma, Dr. Alvy Bimler is following.   Pain Inventory Average Pain 9 Pain Right Now 9 My pain is constant, sharp, burning, stabbing and aching  In the last 24 hours, has pain interfered with the following? General activity 10 Relation with others 10 Enjoyment of life 9 What TIME of day is your pain at its worst? constant Sleep (in general) Fair  Pain is worse with: walking, bending, sitting, inactivity, standing and some activites Pain improves with: rest, heat/ice, therapy/exercise, medication and injections Relief from Meds: 7  Mobility walk without assistance walk with assistance use a cane use a walker how many minutes can you walk? 5 ability to climb steps?  yes do you drive?  yes needs help with transfers transfers alone Do you have any goals in this area?  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  yes  Neuro/Psych bladder control problems bowel control problems weakness numbness tremor tingling trouble walking spasms dizziness confusion depression anxiety  Prior Studies Any changes since last visit?  no  Physicians involved in your care Any changes since last visit?  no   Family History  Problem Relation Age of Onset  . Cancer Mother   . Migraines Mother   . Heart disease Father   . Hypertension Father   . Cancer Father     colon  . Hypertension Brother   . Migraines Brother   . Hypertension Brother   . Migraines  Brother   . Migraines Daughter    Social History   Social History  . Marital status: Married    Spouse name: N/A  . Number of children: 1  . Years of education: COLLEGE1   Occupational History  . HOUSEWIFE Unemployed   Social History Main Topics  . Smoking status: Never Smoker  . Smokeless tobacco: Never Used  . Alcohol use No  . Drug use: No  . Sexual activity: Not Asked   Other Topics Concern  . None   Social History Narrative   Patient is right handed.   Patient drinks caffeine occasionally.   Past Surgical History:  Procedure Laterality Date  .  kidney stones    . ABDOMINAL ADHESION SURGERY    . ABDOMINAL HYSTERECTOMY  1995   partial- hemmoraged after surgery-stitch came loose  . APPENDECTOMY  1993   hemmoraged after surgery- stitch came loose  . CERVICAL DISC SURGERY  06/04/2001   with fusion  . CHOLECYSTECTOMY  1985  . colonoscopy    . COLONOSCOPY    . CYSTOSCOPY    . FOOT SURGERY     lt  . jj stent  07/2002   with ureteroscopy, cystoscopy and then removal of stent in office  . LITHOTRIPSY    . LYMPH NODE BIOPSY Right 03/06/2014   Procedure: right groin LYMPH NODE BIOPSY;  Surgeon: Merrie Roof, MD;  Location: [facility];  Service: General;  Laterality: Right;  .  LYMPHADENECTOMY N/A 12/29/2015   Procedure: RETROPERITONEAL LYMPHADENECTOMY;  Surgeon: Alexis Frock, MD;  [facility];  Service: Urology;  Laterality: N/A;  . NECK SURGERY  2002  . ROBOT ASSISTED LAPAROSCOPIC NEPHRECTOMY Left 12/29/2015   Procedure: XI ROBOTIC ASSISTED LEFT LAPAROSCOPIC NEPHRECTOMY;  Surgeon: Alexis Frock, MD;  [facility];  Service: Urology;  Laterality: Left;  . TONSILLECTOMY  1976   Past Medical History:  Diagnosis Date  . Anxiety   . Arthritis   . Asthma   . Bipolar affective ([place])   . Calcifying tendinitis of shoulder   . Carpal tunnel syndrome   . Cervical facet syndrome   . Chronic pain syndrome   . Complication of anesthesia   .  Depression   . Diverticulosis   . Falls   . Fibromyalgia   . 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 Full dentures   . Gout   . Headache(784.0)    migraines  . Herpesviral infection   . Hypertension   . IBS (irritable bowel syndrome)    with diarrhea  . Lymphadenopathy   . Myalgia and myositis, unspecified   . PONV (postoperative nausea and vomiting)   . PTSD (post-traumatic stress disorder)   . Renal calculi   . Restless legs syndrome (RLS)   . Thoracic radiculopathy   . Vitamin D deficiency   . Wears glasses    BP (!) 117/57 (BP Location: Right Arm, Patient Position: Sitting, Cuff Size: Normal)   Pulse 95   Resp (!) 95   SpO2 96%   Opioid Risk Score:   Fall Risk Score:  `1  Depression screen PHQ 2/9  Depression screen [facility] 2/9 05/17/2016 05/04/2016 03/03/2016 12/01/2015 06/25/2015  Decreased Interest 2 2 0 2 2  Down, Depressed, Hopeless 2 2 0 2 2  PHQ - 2 Score 4 4 0 4 4  Some recent data might be hidden    Review of Systems  Constitutional: Positive for appetite change, diaphoresis and unexpected weight change.  Eyes: Negative.   Respiratory: Negative.   Cardiovascular: Positive for leg swelling.  Gastrointestinal: Positive for abdominal pain, diarrhea, nausea and vomiting.  Endocrine: Negative.   Genitourinary: Positive for difficulty urinating.  Musculoskeletal: Positive for back pain and neck pain.       Spasms   Allergic/Immunologic: Negative.   Neurological: Positive for dizziness, tremors, weakness, numbness and headaches.       Tingling  Hematological: Negative.   Psychiatric/Behavioral: Positive for confusion and dysphoric mood. The patient is nervous/anxious.   All other systems reviewed and are negative.      Objective:   Physical Exam  Constitutional: She is oriented to person, place, and time. She appears well-developed and well-nourished.  HENT:  Head: Normocephalic and atraumatic.  Neck: Normal range of motion.  Neck supple.  Cardiovascular: Normal rate and regular rhythm.   Pulmonary/Chest: Effort normal and breath sounds normal.  Musculoskeletal:  Normal Muscle Bulk and Muscle Testing Reveals: Upper Extremities: Full ROM and Muscle Strength 5/5 Thoracic Paraspinal Tenderness: T-7- T-9 Lumbar Paraspinal Tenderness: L-3-L-5 Lower Extremities: Full ROM and Muscle Strength 5/5 Arises from table slowly Narrow Based Gait  Neurological: She is alert and oriented to person, place, and time.  Skin: Skin is warm and dry.  Psychiatric: She has a normal mood and affect.  Nursing note and vitals reviewed.         Assessment & Plan:  1. History of fibromyalgia with myofascial pain and multiple trigger  points. Continue with Heat and exercise Regime. Continue with gabapentin. Scheduled for Trigger point injection on 07/18/16 with Dr. Naaman Plummer. 2. Chronic migraine headaches.Continue to Monitor 3. Lumbar degenerative disk disease, L4-5 :  Refilled: Fentanyl Patch 25 mcg one patch every three days #10. Continue Hydrocodone 5/325 mg one tablet every 6 hours as needed #90.  4. History of Left Renal Mass: S/P Left Laparoscopic Nephrectomy 12/29/2015. Urology Following 5. 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  20 minutes of face to face patient care time was spent during this visit. All questions were encouraged and answered.   F/U in 1 month

## 2016-06-16 LAB — TOXASSURE SELECT,+ANTIDEPR,UR

## 2016-06-21 ENCOUNTER — Encounter: Payer: Self-pay | Admitting: Podiatry

## 2016-06-21 ENCOUNTER — Ambulatory Visit (INDEPENDENT_AMBULATORY_CARE_PROVIDER_SITE_OTHER): Payer: 59 | Admitting: Podiatry

## 2016-06-21 VITALS — BP 113/74 | HR 105 | Resp 16

## 2016-06-21 DIAGNOSIS — Q828 Other specified congenital malformations of skin: Secondary | ICD-10-CM | POA: Diagnosis not present

## 2016-06-21 NOTE — Progress Notes (Signed)
Subjective:     Patient ID: Patty Salas, female   DOB: 1956-08-26, 60 y.o.   MRN: [id number]  HPI patient presents with lesions plantar aspect right and stated she had to have a kidney removed   Review of Systems     Objective:   Physical Exam Neurovascular status intact muscle strength adequate with keratotic lesion sub-second metatarsal right that are painful when pressed    Assessment:     Lesion secondary to pressure    Plan:     Debridement accomplished with sterile dressing applied with a small amount of bleeding and advised on soaks and reappoint as needed

## 2016-06-21 NOTE — Progress Notes (Signed)
Urine drug screen for this encounter is consistent for prescribed medications.   

## 2016-06-22 ENCOUNTER — Telehealth: Payer: Self-pay | Admitting: Physical Medicine & Rehabilitation

## 2016-06-22 NOTE — Telephone Encounter (Signed)
Patient had foot surgery Dr. Paulla Dolly done yesterday, she didn't get any medication from them.  Patient okay on medication.  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

## 2016-06-22 NOTE — Telephone Encounter (Signed)
FYI

## 2016-06-23 ENCOUNTER — Ambulatory Visit: Payer: 59 | Admitting: Podiatry

## 2016-07-18 ENCOUNTER — Encounter: Payer: Self-pay | Admitting: Physical Medicine & Rehabilitation

## 2016-07-18 ENCOUNTER — Encounter: Payer: 59 | Attending: Physical Medicine and Rehabilitation | Admitting: Physical Medicine & Rehabilitation

## 2016-07-18 VITALS — BP 113/75 | HR 104 | Resp 14

## 2016-07-18 DIAGNOSIS — Z79899 Other long term (current) drug therapy: Secondary | ICD-10-CM | POA: Insufficient documentation

## 2016-07-18 DIAGNOSIS — G5600 Carpal tunnel syndrome, unspecified upper limb: Secondary | ICD-10-CM

## 2016-07-18 DIAGNOSIS — M797 Fibromyalgia: Secondary | ICD-10-CM | POA: Insufficient documentation

## 2016-07-18 DIAGNOSIS — H8110 Benign paroxysmal vertigo, unspecified ear: Secondary | ICD-10-CM

## 2016-07-18 DIAGNOSIS — M4716 Other spondylosis with myelopathy, lumbar region: Secondary | ICD-10-CM

## 2016-07-18 DIAGNOSIS — G43519 Persistent migraine aura without cerebral infarction, intractable, without status migrainosus: Secondary | ICD-10-CM

## 2016-07-18 DIAGNOSIS — M5136 Other intervertebral disc degeneration, lumbar region: Secondary | ICD-10-CM | POA: Diagnosis present

## 2016-07-18 DIAGNOSIS — G894 Chronic pain syndrome: Secondary | ICD-10-CM | POA: Insufficient documentation

## 2016-07-18 DIAGNOSIS — Z5181 Encounter for therapeutic drug level monitoring: Secondary | ICD-10-CM | POA: Diagnosis present

## 2016-07-18 DIAGNOSIS — N200 Calculus of kidney: Secondary | ICD-10-CM

## 2016-07-18 DIAGNOSIS — F0781 Postconcussional syndrome: Secondary | ICD-10-CM | POA: Insufficient documentation

## 2016-07-18 MED ORDER — FENTANYL 25 MCG/HR TD PT72: 25.0000 ug | MEDICATED_PATCH | TRANSDERMAL | 0 refills | Status: DC

## 2016-07-18 MED ORDER — HYDROCODONE-ACETAMINOPHEN 5-325 MG PO TABS: 1.0000 | ORAL_TABLET | Freq: Three times a day (TID) | ORAL | 0 refills | Status: DC | PRN

## 2016-07-18 NOTE — Progress Notes (Signed)
Subjective:    Patient ID: Patty Salas, female    DOB: 1956-06-30, 60 y.o.   MRN: [id number]  HPI   Tris is here in follow up of her chronic pain. She continues to have pain in her neck, upper back and low back among other areas. She has concerns that a recent fall may have affected the "alignment" in her neck and shoulders. She is also having tingling and pain in her arms as well which is concerning to her.    She continues to work on regular posture and exercises at home to improve her alignment.    Pain Inventory Average Pain 9 Pain Right Now 9 My pain is constant, sharp, burning, stabbing and aching  In the last 24 hours, has pain interfered with the following? General activity 9 Relation with others 9 Enjoyment of life 10 What TIME of day is your pain at its worst? Constant Sleep (in general) Fair  Pain is worse with: walking, bending, sitting, inactivity, standing and some activites Pain improves with: All Relief from Meds: 5  Mobility walk without assistance walk with assistance use a cane use a walker how many minutes can you walk? 5-10 ability to climb steps?  yes do you drive?  yes transfers alone Do you have any goals in this area?  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  yes  Neuro/Psych bladder control problems bowel control problems weakness numbness tremor tingling trouble walking spasms dizziness confusion depression anxiety  Prior Studies Any changes since last visit?  no  Physicians involved in your care Any changes since last visit?  no   Family History  Problem Relation Age of Onset  . Cancer Mother   . Migraines Mother   . Heart disease Father   . Hypertension Father   . Cancer Father     colon  . Hypertension Brother   . Migraines Brother   . Hypertension Brother   . Migraines Brother   .  Migraines Daughter    Social History   Social History  . Marital status: Married    Spouse name: N/A  . Number of children: 1  . Years of education: COLLEGE1   Occupational History  . HOUSEWIFE Unemployed   Social History Main Topics  . Smoking status: Never Smoker  . Smokeless tobacco: Never Used  . Alcohol use No  . Drug use: No  . Sexual activity: Not Asked   Other Topics Concern  . None   Social History Narrative   Patient is right handed.   Patient drinks caffeine occasionally.   Past Surgical History:  Procedure Laterality Date  .  kidney stones    . ABDOMINAL ADHESION SURGERY    . ABDOMINAL HYSTERECTOMY  1995   partial- hemmoraged after surgery-stitch came loose  . APPENDECTOMY  1993   hemmoraged after surgery- stitch came loose  . CERVICAL DISC SURGERY  06/04/2001   with fusion  . CHOLECYSTECTOMY  1985  . colonoscopy    . COLONOSCOPY    . CYSTOSCOPY    . FOOT SURGERY     lt  . jj stent  07/2002   with ureteroscopy, cystoscopy and then removal of stent in office  . LITHOTRIPSY    . LYMPH NODE BIOPSY Right 03/06/2014   Procedure: right groin LYMPH NODE BIOPSY;  Surgeon: Merrie Roof, MD;  Location: [place];  Service: General;  Laterality: Right;  . LYMPHADENECTOMY N/A 12/29/2015   Procedure: RETROPERITONEAL LYMPHADENECTOMY;  Surgeon: Alexis Frock, MD;  Location: [facility];  Service: Urology;  Laterality: N/A;  . NECK SURGERY  2002  . ROBOT ASSISTED LAPAROSCOPIC NEPHRECTOMY Left 12/29/2015   Procedure: XI ROBOTIC ASSISTED LEFT LAPAROSCOPIC NEPHRECTOMY;  Surgeon: Alexis Frock, MD;  Location: [facility];  Service: Urology;  Laterality: Left;  . TONSILLECTOMY  1976   Past Medical History:  Diagnosis Date  . Anxiety   . Arthritis   . Asthma   . Bipolar affective ([place])   . Calcifying tendinitis of shoulder   . Carpal tunnel syndrome   . Cervical facet syndrome   . Chronic pain syndrome   . Complication of anesthesia   . Depression   .  Diverticulosis   . Falls   . Fibromyalgia   . 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 Full dentures   . Gout   . Headache(784.0)    migraines  . Herpesviral infection   . Hypertension   . IBS (irritable bowel syndrome)    with diarrhea  . Lymphadenopathy   . Myalgia and myositis, unspecified   . PONV (postoperative nausea and vomiting)   . PTSD (post-traumatic stress disorder)   . Renal calculi   . Restless legs syndrome (RLS)   . Thoracic radiculopathy   . Vitamin D deficiency   . Wears glasses    BP 113/75   Pulse (!) 104   Resp 14   SpO2 96%   Opioid Risk Score:   Fall Risk Score:  `1  Depression screen PHQ 2/9  Depression screen [facility] 2/9 05/17/2016 05/04/2016 03/03/2016 12/01/2015 06/25/2015  Decreased Interest 2 2 0 2 2  Down, Depressed, Hopeless 2 2 0 2 2  PHQ - 2 Score 4 4 0 4 4  Some recent data might be hidden     Review of Systems  Constitutional: Positive for chills and unexpected weight change.  Gastrointestinal: Positive for abdominal pain, diarrhea, nausea and vomiting.  Musculoskeletal: Positive for joint swelling.       Limb swelling  Skin: Positive for rash.  All other systems reviewed and are negative.      Objective:   Physical Exam  Constitutional: She is oriented to person, place, and time. She appears well-developed and well-nourished. She is wearing a foam cervical collar.  HENT:  Head: Normocephalic and atraumatic.  Eyes: Conjunctivae and EOM are normal. Pupils are equal, round, and reactive to light.  Neck: Neck supple.  Cardiovascular: Normal rate and regular rhythm.  Pulmonary/Chest: Effort normal and breath sounds normal.  Abdominal: Soft. Bowel sounds are normal.  Musculoskeletal:  Left hip tender with flexion and rotation. FABER + on left with pain in groin. She has pain around lateral elbow along common extensor tendon. Has TP's in the bilateral traps, and splenius capitis, and  left lower lumbar  spine.  Neurological: She is alert and oriented to person, place, and time. She has normal strength with some pain inhibition weakness in the RUE. Had difficulty tracking to the right and left---appeared to have nystagmus but closed her eyes to decreased the discomfort. DTR;s 1+ to 2+ in biceps, triceps, ECR bilaterally. Spurling's negative. Strength normal Gait pattern is stable. Balance stable. Psychiatric: Her speech is normal and behavior is normal. Judgment normal. She is very calm and collected today. In good spirits.    Assessment & Plan:  ASSESSMENT:  1. History of fibromyalgia with myofascial pain and multiple trigger points.  2. Chronic migraine headaches.  3. Lumbar degenerative disk disease, L4-5  4. History of nephrolithiasis. Cysts on left kidney  5. Bilateral arm tingling, hx of cervical fusion----I see no signs of radiculopathy on exam today 6. Right CTS  7. DeQuervain's Tenosynovitis  8. Fall with concussion and PCS including increased headaches and BPPV--symptoms appear better.  9. Left lateral epicondylitis  10. Left hip pain. ?OA vs kidney stones    PLAN:  1.No indication for NCS or cervical imaging at this point 2. After informed consent and preparation of the skin with isopropyl alcohol, I injected the left lumbar paraspinal, bilateral splenius capitis, left mid trap---four total injections. The patient tolerated well, and no complications were experienced. Post-injection instructions were provided.  3. Continue with HEP  4. Botox migraine is still a consideration in the future.  6. Continue Norco 5/325 and duragesic patch. refillled  x 2.  5. She will follow up with me or our NP in about 2 month. 30 minutes of face to face patient care time were spent during this visit. All questions were encouraged and answered.

## 2016-07-18 NOTE — Patient Instructions (Signed)
CONTINUE TO WORK ON YOUR POSTURE.   HEAD BACK, AND CHEST FORWARD!!!   PLEASE CALL ME WITH ANY PROBLEMS OR QUESTIONS [phone])

## 2016-07-19 ENCOUNTER — Telehealth: Payer: Self-pay | Admitting: Neurology

## 2016-07-19 NOTE — Telephone Encounter (Addendum)
Pt called said her husband will not bring her to the appt tomorrow.  She was crying and said she had trigger point yesterday and flu shot yesterday. Today she has aching in joints and a migraine. I advised her the last OV was 4/16 and she would need to be seen before future medications could be refilled. She said she will not be out of medication until the middle of November. She has a f/u appt with PCP on 10/26.  She will call back to r/s appt as soon as she is feeling better. Pt requested msg to be sent to Dr Viona Gilmore. I was on the phone with this pt for over 15 min. FYI

## 2016-07-20 ENCOUNTER — Ambulatory Visit: Payer: 59 | Admitting: Neurology

## 2016-07-24 ENCOUNTER — Telehealth: Payer: Self-pay | Admitting: Neurology

## 2016-07-24 NOTE — Telephone Encounter (Signed)
Pt called to advise she was sent OV forms to fill out prior to her visit. She was sent the forms but she is not sure who sent them to her, she talked with [facility] last but she is not sure who actually sent her the form.  She said she rec'd pg 1 and not pg 2. I will talk with ladies at the front to get this 2nd form to her.

## 2016-08-07 ENCOUNTER — Encounter: Payer: Self-pay | Admitting: Podiatry

## 2016-08-07 ENCOUNTER — Ambulatory Visit (INDEPENDENT_AMBULATORY_CARE_PROVIDER_SITE_OTHER): Payer: 59 | Admitting: Podiatry

## 2016-08-07 DIAGNOSIS — Q828 Other specified congenital malformations of skin: Secondary | ICD-10-CM | POA: Diagnosis not present

## 2016-08-07 DIAGNOSIS — M722 Plantar fascial fibromatosis: Secondary | ICD-10-CM

## 2016-08-07 DIAGNOSIS — M779 Enthesopathy, unspecified: Secondary | ICD-10-CM

## 2016-08-07 NOTE — Progress Notes (Signed)
Subjective:     Patient ID: Patty Salas, female   DOB: 22-Jan-1956, 60 y.o.   MRN: [id number]  HPI patient presents stating she has inflammation around the lesser MPJs right with lesion formation and also become painful   Review of Systems     Objective:   Physical Exam Neurovascular status intact with inflammatory changes around the lesser MPJs right with porokeratotic type lesions that upon debridement are very painful when pressed and superficial    Assessment:     Chronic lesion formation with inflammatory capsulitis    Plan:     Due to long-standing nature we went ahead and we scanned for customized orthotic devices today to reduce plantar pressure and I debrided approximate 6 separate lesions to take pressure off

## 2016-08-15 ENCOUNTER — Telehealth: Payer: Self-pay | Admitting: *Deleted

## 2016-08-15 NOTE — Telephone Encounter (Signed)
Pt states she will have an appt to pick up orthotics and she would like to make sure Dr. Paulla Dolly has enough time to shave her sweat glands more deeply.

## 2016-08-16 IMAGING — DX DG HIP (WITH OR WITHOUT PELVIS) 2-3V*L*
3 series · 3 of 3 positions shown · non-contrast
Comparison: None.

CLINICAL DATA: Patient with left hip pain for 2 weeks. Prior fall.
Initial encounter.

EXAM:
DG HIP (WITH OR WITHOUT PELVIS) 2-3V LEFT

[pelvis ap (1 of 2)]
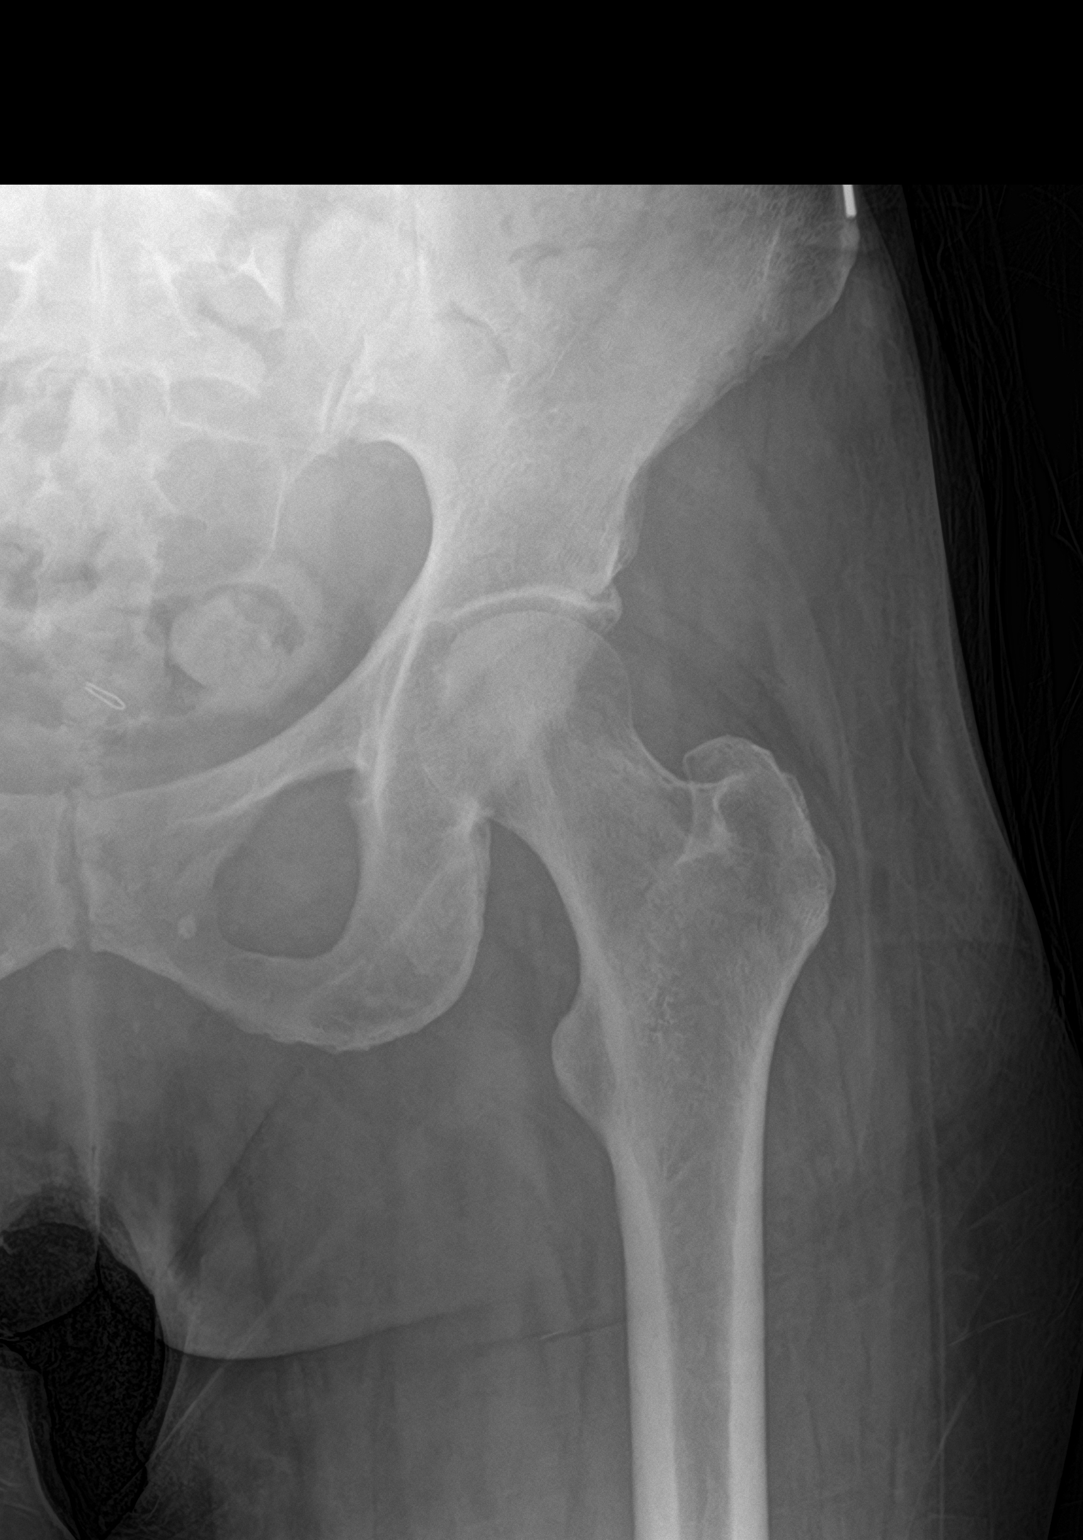

[hip lat]
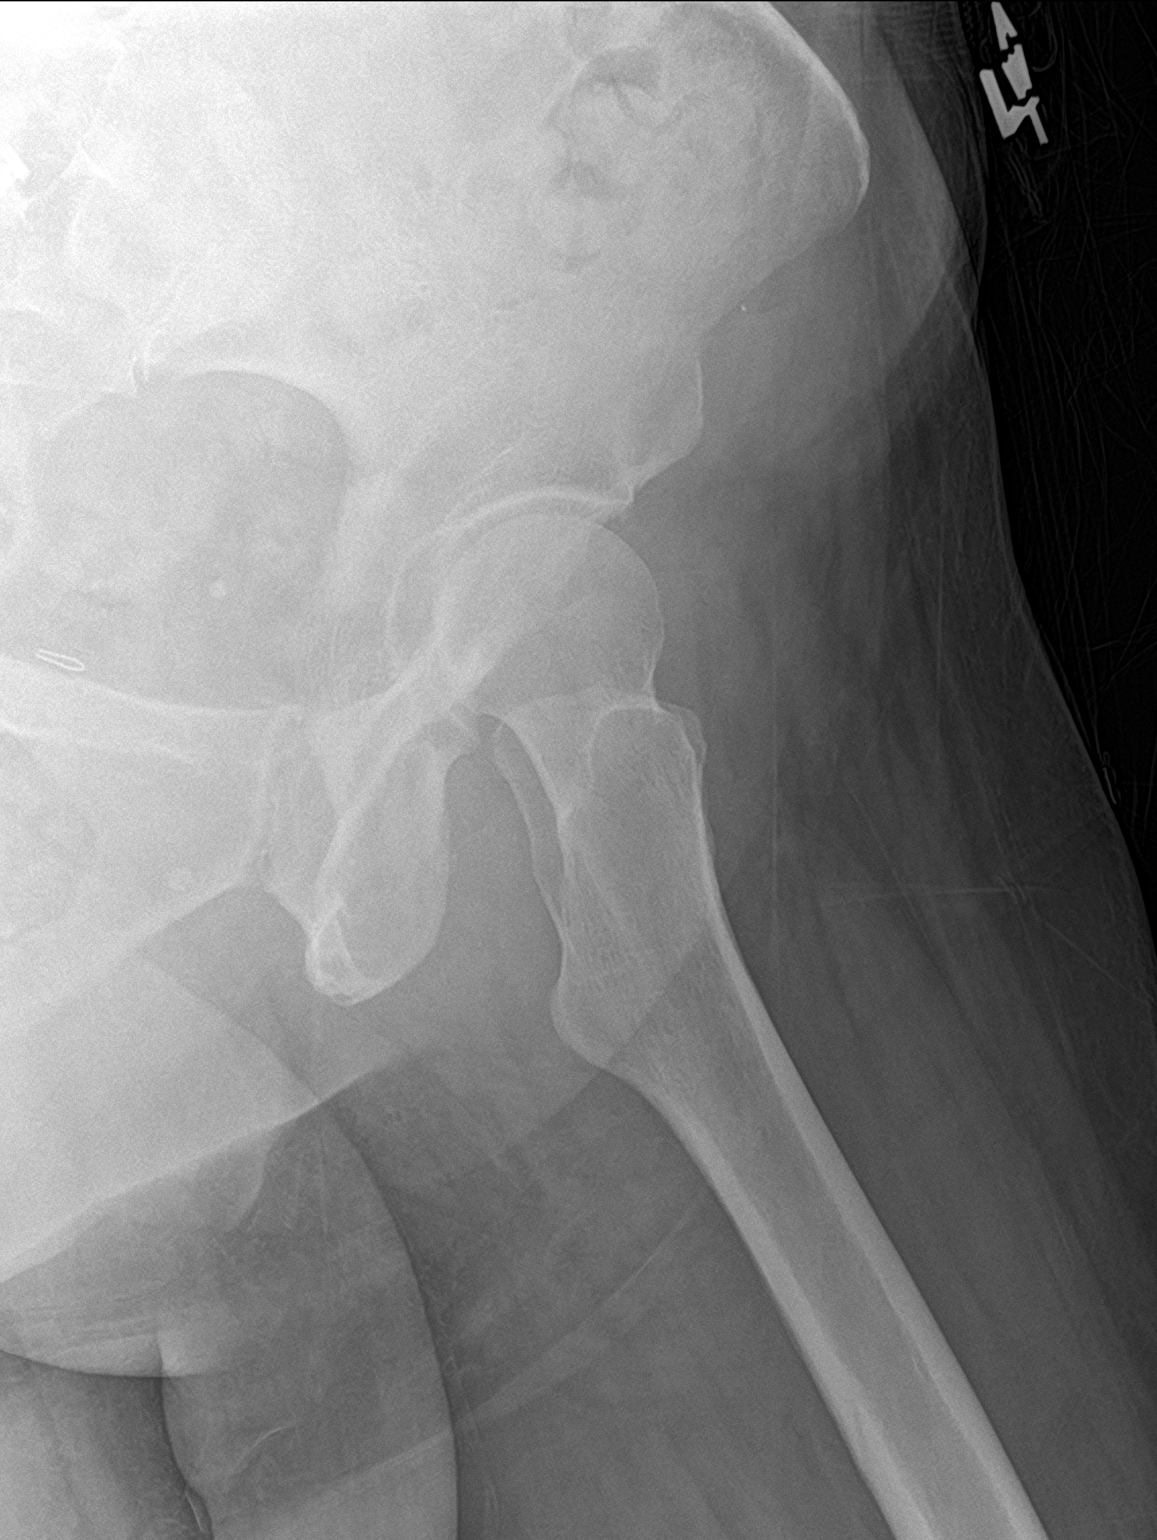

[pelvis ap (2 of 2)]
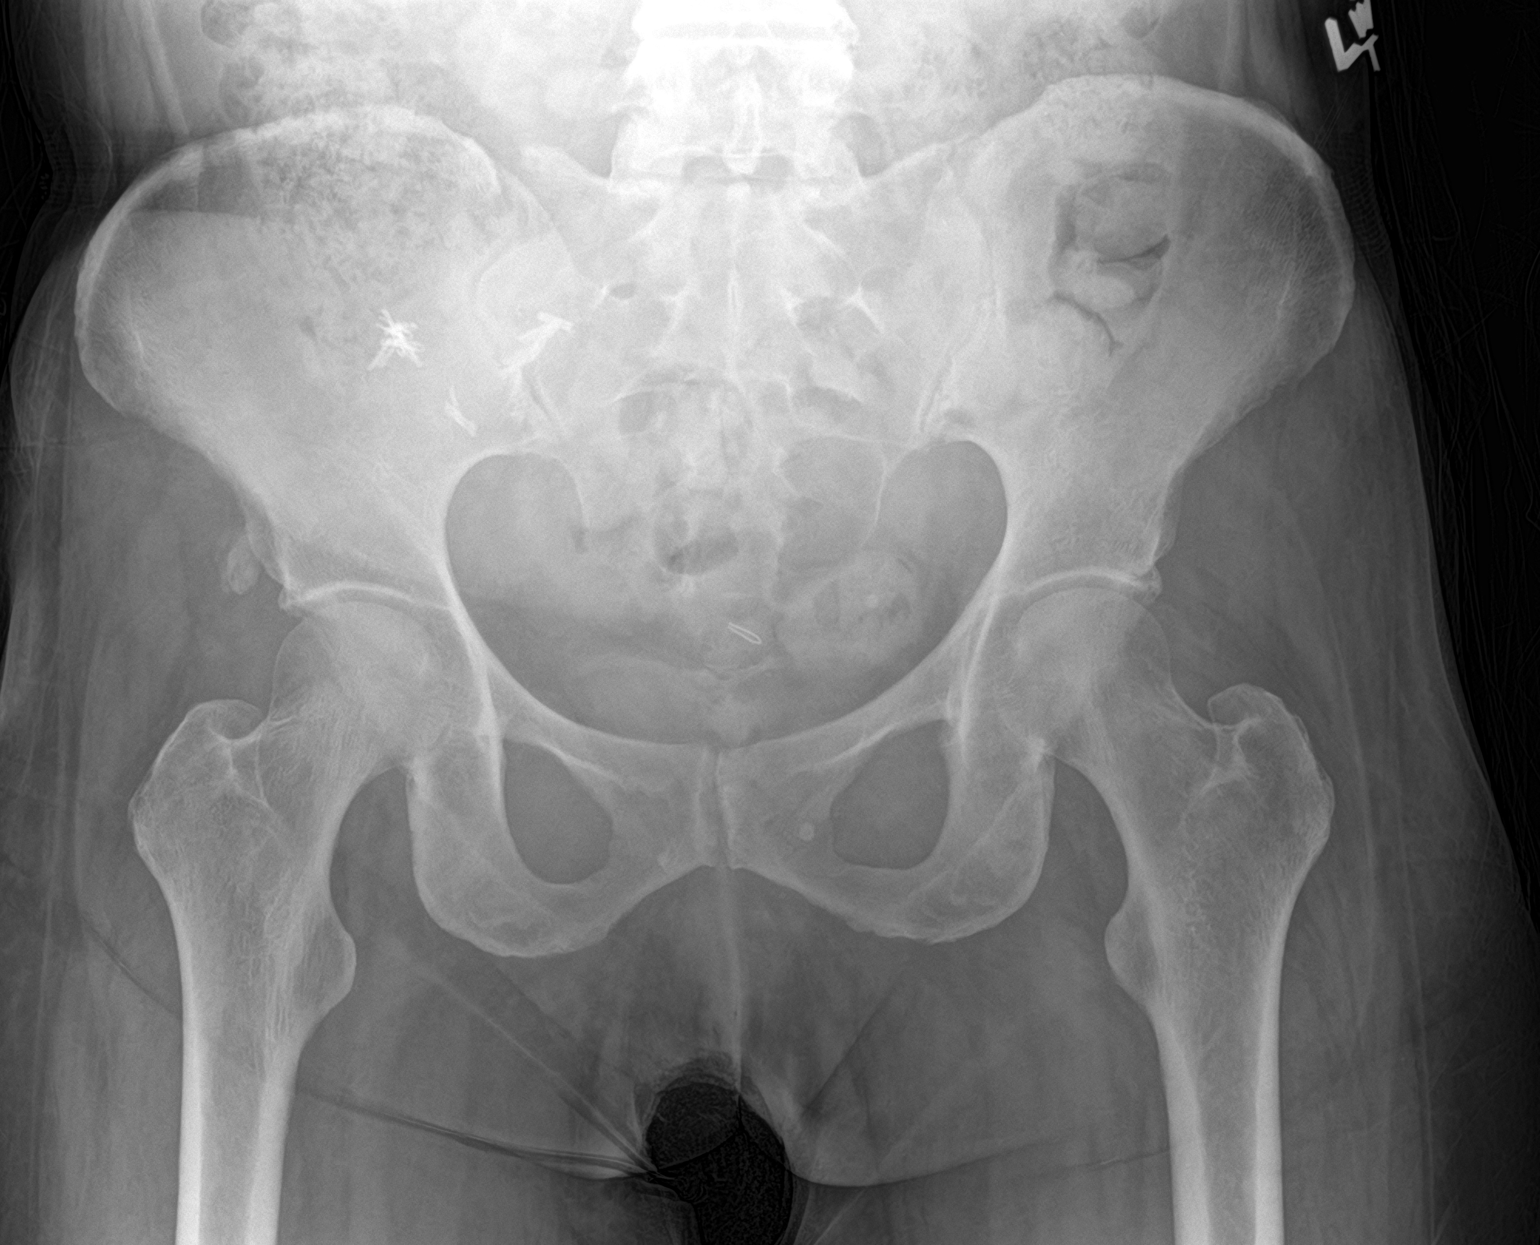

[3 of 3 positions shown; findings below may reference images not displayed]

FINDINGS: Normal anatomic alignment. No evidence for acute fracture
dislocation. No significant osseous degenerative change. SI joints
are unremarkable. Right hemipelvis surgical clips.
IMPRESSION: No acute osseous abnormality.

## 2016-08-30 ENCOUNTER — Encounter: Payer: Self-pay | Admitting: Podiatry

## 2016-08-30 ENCOUNTER — Ambulatory Visit (INDEPENDENT_AMBULATORY_CARE_PROVIDER_SITE_OTHER): Payer: 59 | Admitting: Podiatry

## 2016-08-30 DIAGNOSIS — M779 Enthesopathy, unspecified: Secondary | ICD-10-CM

## 2016-08-30 DIAGNOSIS — Q828 Other specified congenital malformations of skin: Secondary | ICD-10-CM | POA: Diagnosis not present

## 2016-08-30 DIAGNOSIS — M79671 Pain in right foot: Secondary | ICD-10-CM

## 2016-08-30 NOTE — Patient Instructions (Signed)

## 2016-08-31 NOTE — Progress Notes (Signed)
Subjective:     Patient ID: Patty Salas, female   DOB: 03/14/1956, 60 y.o.   MRN: [id number]  HPI patient presents with chronic pain in her right foot with distal lesion formation that become painful   Review of Systems     Objective:   Physical Exam Neurovascular status unchanged with keratotic lesion plantar aspect right that has a nucleated core with patient also noted to have moderate symptoms in the forefoot in general right    Assessment:     Inflammatory capsulitis with lesion formation right    Plan:     Debrided keratotic lesion and advised on orthotics and patient will begin orthotics at this time with instructions given to patient. Reappoint when symptomatic

## 2016-09-01 ENCOUNTER — Other Ambulatory Visit: Payer: Self-pay | Admitting: Neurology

## 2016-09-05 ENCOUNTER — Encounter: Payer: Self-pay | Admitting: Neurology

## 2016-09-05 ENCOUNTER — Ambulatory Visit (INDEPENDENT_AMBULATORY_CARE_PROVIDER_SITE_OTHER): Payer: 59 | Admitting: Neurology

## 2016-09-05 VITALS — BP 125/68 | HR 89 | Ht 65.5 in | Wt 139.0 lb

## 2016-09-05 DIAGNOSIS — G43519 Persistent migraine aura without cerebral infarction, intractable, without status migrainosus: Secondary | ICD-10-CM | POA: Diagnosis not present

## 2016-09-05 MED ORDER — ALMOTRIPTAN MALATE 12.5 MG PO TABS: ORAL_TABLET | ORAL | 3 refills | Status: DC

## 2016-09-05 NOTE — Progress Notes (Signed)
Reason for visit: Headache  Patty Salas is an 60 y.o. female  History of present illness:  Patty Salas is a 60 year old right-handed white female with a history of fibromyalgia and chronic daily headaches. 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 The patient has been underlying stress with recent deaths of her mother, daughter, and father. The patient takes Axert for migraines, she is followed by Dr. Tessa Lerner and gets trigger point injections which are helpful. The patient returns this office for an evaluation.  Past Medical History:  Diagnosis Date  . Anxiety   . Arthritis   . Asthma   . Bipolar affective ([place])   . Calcifying tendinitis of shoulder   . Carpal tunnel syndrome   . Cervical facet syndrome   . Chronic pain syndrome   . Complication of anesthesia   . Depression   . Diverticulosis   . Falls   . Fibromyalgia   . 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 Full dentures   . Gout   . Headache(784.0)    migraines  . Herpesviral infection   . Hypertension   . IBS (irritable bowel syndrome)    with diarrhea  . Lymphadenopathy   . Myalgia and myositis, unspecified   . PONV (postoperative nausea and vomiting)   . PTSD (post-traumatic stress disorder)   . Renal calculi   . Restless legs syndrome (RLS)   . Thoracic radiculopathy   . Vitamin D deficiency   . Wears glasses     Past Surgical History:  Procedure Laterality Date  .  kidney stones    . ABDOMINAL ADHESION SURGERY    . ABDOMINAL HYSTERECTOMY  1995   partial- hemmoraged after surgery-stitch came loose  . APPENDECTOMY  1993   hemmoraged after surgery- stitch came loose  . CERVICAL DISC SURGERY  06/04/2001   with fusion  . CHOLECYSTECTOMY  1985  . colonoscopy    . COLONOSCOPY    . CYSTOSCOPY    . FOOT SURGERY     lt  . jj stent  07/2002   with ureteroscopy, cystoscopy and then removal of stent in  office  . LITHOTRIPSY    . LYMPH NODE BIOPSY Right 03/06/2014   Procedure: right groin LYMPH NODE BIOPSY;  Surgeon: Merrie Roof, MD;  Location: [place];  Service: General;  Laterality: Right;  . LYMPHADENECTOMY N/A 12/29/2015   Procedure: RETROPERITONEAL LYMPHADENECTOMY;  Surgeon: Alexis Frock, MD;  [facility];  Service: Urology;  Laterality: N/A;  . NECK SURGERY  2002  . ROBOT ASSISTED LAPAROSCOPIC NEPHRECTOMY Left 12/29/2015   Procedure: XI ROBOTIC ASSISTED LEFT LAPAROSCOPIC NEPHRECTOMY;  Surgeon: Alexis Frock, MD;  Location: [facility];  Service: Urology;  Laterality: Left;  . TONSILLECTOMY  1976    Family History  Problem Relation Age of Onset  . Cancer Mother   . Migraines Mother   . Heart disease Father   . Hypertension Father   . Cancer Father     colon  . Hypertension Brother   . Migraines Brother   . Hypertension Brother   . Migraines Brother   . Migraines Daughter     Social history:  reports that she has never smoked. She has never used smokeless tobacco. She reports that she does not drink alcohol or use drugs.    Allergies  Allergen Reactions  . Cymbalta [Duloxetine Hcl] Anaphylaxis  Suicidal if in combination w/ Axert  . Divalproex Sodium Anaphylaxis  . Duract [Bromfenac] Anaphylaxis  . Hydrochlorothiazide Anaphylaxis    Pt loses facial movement uncontrolled;   . Imitrex [Sumatriptan Succinate] Anaphylaxis  . Latex Anaphylaxis  . Mellaril Anaphylaxis  . Olanzapine Anaphylaxis  . Penicillins Anaphylaxis  . Topamax Anaphylaxis and Other (See Comments)    Confusion, tremor, blurred vision  . Aripiprazole Other (See Comments)    Stiffened muscles  . Aspirin Hives and Itching  . Dalmane [Flurazepam Hcl] Other (See Comments)    Stiffened all muscles  . Darifenacin Hydrobromide Er Hives    Hives on back and looked like sunburn  . Metoclopramide Hcl Other (See Comments)    headache  . Seroquel [Quetiapine Fumerate] Other (See  Comments)    extreme fatigue and bad dreams  . Statins Hives  . Stelazine Other (See Comments)    Stiffened muscles  . Thorazine [Chlorpromazine Hcl] Other (See Comments)    Stiffened muscles  . Abilify [Aripiprazole]     Stiffened muscles, extrapyramidal effects all over body.   . Alka-Seltzer [Aspirin Effervescent]     Mouth ulcers, swallowing problems.   . Allopurinol Hives  . Biofreeze [Menthol (Topical Analgesic)] Hives and Itching  . Capzasin [Capsaicin] Itching    Redness   . Colchicine Hives  . Cough Drops [Benzocaine]     orthicoat sprays containing menthol or lemon flavor--breaks inside of mouth out, red rash, mouth ulcers.   . Diflucan [Fluconazole] Hives and Itching  . Iohexol      Code: HIVES, Desc: pt broke out in red rash and hives after CT injection on 12/07/09.-pt needs 13 hr prep kit, Onset Date: [id number]   . Ivp Dye [Iodinated Diagnostic Agents]   . Lyrica [Pregabalin]   . Metamucil [Psyllium]     Mouth ulcers, swallowing problems.  Merril Abbe [Polyethylene Glycol] Nausea And Vomiting  . Myrbetriq [Mirabegron]     swelling  . Nsaids     Mouth ulcers, swelling of mouth.   . Other Itching and Other (See Comments)    Dust, mold, feathers, wool, flannel, cologne, chlorox, household cleaning products, scented candles, cinnamon, ivory soap, paints, sun sentitive, acidic fruits, ( lemons, limes, strawbery, tartrazine yellow#5 and 6 in foods, MSG food additives, sudiium lauryl sulates, hot peppers, spearmint, wintergreen peppermint, mentol, orange, jalapenos. ---headache, breathing, welps, canker sores inside mouth, uticaria  . Potassium-Containing Compounds     Undiluted through IV. --Pain.   . Prednisone     No  Sleep at night.   . Reglan [Metoclopramide]     Headaches.   . Renografin [Diatrizoate]     welps   . Risperdal [Risperidone] Other (See Comments)    Too sedating.   Marland Kitchen Seroquel [Quetiapine Fumarate]     Bad dreams, too sedating.  . Simvastatin Other  (See Comments)    Paranoid affect, muscle spasms.   Marland Kitchen Urocit - K [Potassium Citrate]   . Zyprexa [Olanzapine]   . Avelox [Moxifloxacin Hcl In Nacl] Nausea And Vomiting  . Barium-Containing Compounds Rash  . Diclofenac Rash  . Doxycycline Nausea Only    Stomach upset.   Marland Kitchen E-Mycin [Erythromycin Base] Nausea Only  . Fiorinal [Butalbital-Aspirin-Caffeine] Itching and Rash    Welps,  . Flagyl [Metronidazole Hcl] Nausea And Vomiting  . Lamictal [Lamotrigine] Rash  . Pregabalin     Blurry vision, muscle stiffness   . Risperidone Other (See Comments)    Too sedating  . Toradol [Ketorolac Tromethamine] Itching and  Rash    Medications:  Prior to Admission medications   Medication Sig Start Date End Date Taking? Authorizing Provider  acetaminophen (TYLENOL) 500 MG tablet Take 500 mg by mouth every 6 (six) hours as needed for moderate pain.   Yes Historical Provider, MD  acyclovir (ZOVIRAX) 800 MG tablet Take 800 mg by mouth 3 (three) times daily as needed (sores on mouth.).  02/12/15  Yes Historical Provider, MD  almotriptan (AXERT) 12.5 MG tablet TAKE 1 TABLET (12.5 MG TOTAL) BY MOUTH AS NEEDED FOR MIGRAINE (MAX 2 TABS PER WEEK). 05/23/16  Yes Kathrynn Ducking, MD  bisacodyl (DULCOLAX) 5 MG EC tablet Take 10 mg by mouth daily as needed for mild constipation.    Yes Historical Provider, MD  cyclobenzaprine (FLEXERIL) 10 MG tablet Take 10 mg by mouth 3 (three) times daily.   Yes Historical Provider, MD  docusate sodium (COLACE) 100 MG capsule Take 100 mg by mouth daily as needed for mild constipation.    Yes Historical Provider, MD  EPIPEN 2-PAK 0.3 MG/0.3ML SOAJ injection Inject 0.3 mg into the skin daily as needed (allergic reaction.). Reported on 12/06/2015 07/22/14  Yes Historical Provider, MD  estradiol (VIVELLE-DOT) 0.1 MG/24HR Place 1 patch onto the skin 2 (two) times a week. Wed and Saturday.   Yes Historical Provider, MD  fentaNYL (DURAGESIC - DOSED MCG/HR) 25 MCG/HR patch Place 1 patch (25  mcg total) onto the skin every 3 (three) days. 07/18/16  Yes Meredith Staggers, MD  furosemide (LASIX) 40 MG tablet Take 40-80 mg by mouth daily as needed for fluid or edema.    Yes Historical Provider, MD  gabapentin (NEURONTIN) 300 MG capsule Take 600 mg by mouth at bedtime.   Yes Ricard Dillon, MD  HYDROcodone-acetaminophen (NORCO/VICODIN) 5-325 MG tablet Take 1 tablet by mouth every 8 (eight) hours as needed for moderate pain. 07/18/16  Yes Meredith Staggers, MD  hyoscyamine (LEVSIN, ANASPAZ) 0.125 MG tablet Take 0.125 mg by mouth every 4 (four) hours as needed for bladder spasms or cramping.    Yes Historical Provider, MD  lidocaine (XYLOCAINE) 2 % solution Use as directed 20 mLs in the mouth or throat daily as needed for mouth pain.  02/12/15  Yes Historical Provider, MD  LORazepam (ATIVAN) 1 MG tablet Take 1 mg by mouth 3 (three) times daily.   Yes Historical Provider, MD  potassium chloride SA (K-DUR,KLOR-CON) 20 MEQ tablet Take 20 mEq by mouth 2 (two) times daily.   Yes Historical Provider, MD  PROAIR HFA 108 (90 BASE) MCG/ACT inhaler Inhale 2 puffs into the lungs every 4 (four) hours as needed for wheezing or shortness of breath.  05/29/12  Yes Historical Provider, MD  promethazine (PHENERGAN) 25 MG tablet Take 25 mg by mouth every 6 (six) hours as needed for nausea or vomiting.  08/22/12  Yes Historical Provider, MD  Trospium Chloride 60 MG CP24 Take 1 capsule by mouth every morning.  03/28/12  Yes Festus Aloe, MD  Vitamin D, Ergocalciferol, (DRISDOL) 50000 UNITS CAPS capsule Take 50,000 Units by mouth every 7 (seven) days.  11/17/13  Yes Historical Provider, MD    ROS:  Out of a complete 14 system review of symptoms, the patient complains only of the following symptoms, and all other reviewed systems are negative.  Chills, weight loss, fatigue Heart murmur, swelling in the legs Hearing loss, vertigo Dry skin Blurred vision, loss of vision Wheezing Diarrhea, constipation,  irritable bowel syndrome Urinary incontinence Enlarged lymph nodes  Joint pain, joint swelling, muscle cramps, aching muscles Allergies Skin sensitivity Confusion, headache, numbness, weakness, slurred speech, dizziness, passing out, tremor Depression, anxiety, too much sleep, not enough sleep Snoring, restless legs  Blood pressure 125/68, pulse 89, height 5' 5.5" (1.664 m), weight 139 lb (63 kg).  Physical Exam  General: The patient is alert and cooperative at the time of the examination.  Skin: No significant peripheral edema is noted.   Neurologic Exam  Mental status: The patient is alert and oriented x 3 at the time of the examination. The patient has apparent normal recent and remote memory, with an apparently normal attention span and concentration ability.   Cranial nerves: Facial symmetry is present. Speech is normal, no aphasia or dysarthria is noted. Extraocular movements are full. Visual fields are full.  Motor: The patient has good strength in all 4 extremities.  Sensory examination: Soft touch sensation is symmetric on the legs, decreased on the left face and arm.  Coordination: The patient has good finger-nose-finger and heel-to-shin bilaterally.  Gait and station: The patient has a slightly wide-based gait, patient walks with a cane. Tandem gait was not tested. Romberg is negative. No drift is seen.  Reflexes: Deep tendon reflexes are symmetric.   Assessment/Plan:  1. Chronic daily headache  The patient will be given another prescription for Axert, she gets no other medications through this office. She will follow-up in one year, sooner if needed.  Jill Alexanders MD 09/05/2016 1:33 PM  [facility] [address] [place], [place] [address]  Phone [phone] Fax [phone]

## 2016-09-06 ENCOUNTER — Encounter: Payer: 59 | Attending: Physical Medicine and Rehabilitation | Admitting: Physical Medicine & Rehabilitation

## 2016-09-06 ENCOUNTER — Encounter: Payer: Self-pay | Admitting: Physical Medicine & Rehabilitation

## 2016-09-06 VITALS — BP 128/70 | HR 88 | Resp 14

## 2016-09-06 DIAGNOSIS — M4716 Other spondylosis with myelopathy, lumbar region: Secondary | ICD-10-CM | POA: Diagnosis not present

## 2016-09-06 DIAGNOSIS — G43519 Persistent migraine aura without cerebral infarction, intractable, without status migrainosus: Secondary | ICD-10-CM

## 2016-09-06 DIAGNOSIS — Z79899 Other long term (current) drug therapy: Secondary | ICD-10-CM | POA: Diagnosis present

## 2016-09-06 DIAGNOSIS — Z5181 Encounter for therapeutic drug level monitoring: Secondary | ICD-10-CM | POA: Insufficient documentation

## 2016-09-06 DIAGNOSIS — G894 Chronic pain syndrome: Secondary | ICD-10-CM | POA: Diagnosis not present

## 2016-09-06 DIAGNOSIS — M5136 Other intervertebral disc degeneration, lumbar region: Secondary | ICD-10-CM | POA: Insufficient documentation

## 2016-09-06 DIAGNOSIS — F0781 Postconcussional syndrome: Secondary | ICD-10-CM | POA: Insufficient documentation

## 2016-09-06 DIAGNOSIS — M797 Fibromyalgia: Secondary | ICD-10-CM | POA: Insufficient documentation

## 2016-09-06 MED ORDER — FENTANYL 25 MCG/HR TD PT72: 25.0000 ug | MEDICATED_PATCH | TRANSDERMAL | 0 refills | Status: DC

## 2016-09-06 MED ORDER — HYDROCODONE-ACETAMINOPHEN 5-325 MG PO TABS: 1.0000 | ORAL_TABLET | Freq: Three times a day (TID) | ORAL | 0 refills | Status: DC | PRN

## 2016-09-06 NOTE — Progress Notes (Signed)
PROCEDURE VISIT DX: myofascial pain Procedure: trigger point injections.    After informed consent and preparation of the skin with betadine and isopropyl alcohol, I injected 6mg  (1cc) of celestone and 4cc of 1% lidocaine into  the right and left splenius capitis muscles as well as the high lumbar paraspinals bilaterally. Additionally, aspiration was performed prior to injection. The patient tolerated well, and no complications were encountered. Afterward the area was cleaned and dressed. Post- injection instructions were provided.    Meredith Staggers, MD, [facility] 09/06/2016

## 2016-09-06 NOTE — Patient Instructions (Signed)
PLEASE CALL ME WITH ANY PROBLEMS OR QUESTIONS ([phone])   HAPPY HOLIDAYS!!!!                    *                * *             *   *   *         *  *   *  *  *     *  *  *  *  *  *  * *  *  *  *  *  *  *  *  *  * *               *  *               *  *               *  *  

## 2016-09-27 ENCOUNTER — Telehealth: Payer: Self-pay | Admitting: *Deleted

## 2016-09-27 NOTE — Telephone Encounter (Signed)
"  I'm calling to give 24 hour notice that I will not be in tomorrow.  I have a migraine, IBS with diarrhea and will not be in tomorrow.  I'll be taking Phenergan and can't drive,  I can't think about rescheduling right now and will call back.  Would like to be seen in January." scheduling message sent.

## 2016-09-27 NOTE — Telephone Encounter (Signed)
Acknowledge cancellation Will wait for her to call back

## 2016-09-28 ENCOUNTER — Ambulatory Visit: Payer: 59 | Admitting: Hematology and Oncology

## 2016-09-28 ENCOUNTER — Other Ambulatory Visit: Payer: 59

## 2016-10-06 ENCOUNTER — Encounter: Payer: Self-pay | Admitting: Registered Nurse

## 2016-10-06 ENCOUNTER — Encounter: Payer: 59 | Attending: Physical Medicine and Rehabilitation | Admitting: Registered Nurse

## 2016-10-06 VITALS — BP 145/77 | HR 102 | Resp 14

## 2016-10-06 DIAGNOSIS — M47812 Spondylosis without myelopathy or radiculopathy, cervical region: Secondary | ICD-10-CM | POA: Diagnosis not present

## 2016-10-06 DIAGNOSIS — M5136 Other intervertebral disc degeneration, lumbar region: Secondary | ICD-10-CM | POA: Insufficient documentation

## 2016-10-06 DIAGNOSIS — M797 Fibromyalgia: Secondary | ICD-10-CM | POA: Diagnosis present

## 2016-10-06 DIAGNOSIS — F0781 Postconcussional syndrome: Secondary | ICD-10-CM | POA: Diagnosis present

## 2016-10-06 DIAGNOSIS — G894 Chronic pain syndrome: Secondary | ICD-10-CM

## 2016-10-06 DIAGNOSIS — Z79899 Other long term (current) drug therapy: Secondary | ICD-10-CM | POA: Diagnosis not present

## 2016-10-06 DIAGNOSIS — Z5181 Encounter for therapeutic drug level monitoring: Secondary | ICD-10-CM

## 2016-10-06 DIAGNOSIS — M4716 Other spondylosis with myelopathy, lumbar region: Secondary | ICD-10-CM | POA: Diagnosis not present

## 2016-10-06 DIAGNOSIS — G43519 Persistent migraine aura without cerebral infarction, intractable, without status migrainosus: Secondary | ICD-10-CM | POA: Insufficient documentation

## 2016-10-06 DIAGNOSIS — H8113 Benign paroxysmal vertigo, bilateral: Secondary | ICD-10-CM | POA: Diagnosis not present

## 2016-10-06 MED ORDER — HYDROCODONE-ACETAMINOPHEN 5-325 MG PO TABS: 1.0000 | ORAL_TABLET | Freq: Three times a day (TID) | ORAL | 0 refills | Status: DC | PRN

## 2016-10-06 MED ORDER — FENTANYL 25 MCG/HR TD PT72: 25.0000 ug | MEDICATED_PATCH | TRANSDERMAL | 0 refills | Status: DC

## 2016-10-06 NOTE — Progress Notes (Signed)
Subjective:    Patient ID: Patty Salas, female    DOB: 05/03/1956, 61 y.o.   MRN: [id number]  HPI:  Patty Salas is a 61 year old female who returns for follow up appointment for chronic pain and medication refill. She states her pain is located in her neck and lowerback mainly left side. She rates her pain 10. Her current exercise regime is performing stretching exercises with bands and walking. S/P Trigger Point Injection with good relief noted.  She asked about ESI her last procedure was on 08/25/2014 L-4-L-5, she wants to speak with Dr. Naaman Plummer regarding the procedure next month, all questions answered. She verbalizes understanding.  Also states she has an appointment with Opthamology Dr. Shirley Muscat for Nystagmus.   She has been diagnosed with Follicular Lymphoma, Dr. Alvy Bimler is following.   Pain Inventory Average Pain 9 Pain Right Now 10 My pain is constant, sharp, burning, stabbing and aching  In the last 24 hours, has pain interfered with the following? General activity 9 Relation with others 9 Enjoyment of life 10 What TIME of day is your pain at its worst? morning, night Sleep (in general) Fair  Pain is worse with: walking, bending, sitting, inactivity, standing and some activites Pain improves with: rest, heat/ice, therapy/exercise, pacing activities, medication and injections Relief from Meds: 4  Mobility walk without assistance walk with assistance use a cane use a walker how many minutes can you walk? 5 ability to climb steps?  yes do you drive?  yes Do you have any goals in this area?  yes  Function I need assistance with the following:  meal prep, household duties and shopping Do you have any goals in this area?  yes  Neuro/Psych bladder control problems bowel control problems weakness numbness tremor tingling trouble walking spasms dizziness confusion depression anxiety  Prior Studies Any changes since last visit?   no  Physicians involved in your care Any changes since last visit?  no   Family History  Problem Relation Age of Onset  . Cancer Mother   . Migraines Mother   . Heart disease Father   . Hypertension Father   . Cancer Father     colon  . Hypertension Brother   . Migraines Brother   . Hypertension Brother   . Migraines Brother   . Migraines Daughter    Social History   Social History  . Marital status: Married    Spouse name: N/A  . Number of children: 1  . Years of education: COLLEGE1   Occupational History  . HOUSEWIFE Unemployed   Social History Main Topics  . Smoking status: Never Smoker  . Smokeless tobacco: Never Used  . Alcohol use No  . Drug use: No  . Sexual activity: Not Asked   Other Topics Concern  . None   Social History Narrative   Patient is right handed.   Patient drinks caffeine occasionally.   Past Surgical History:  Procedure Laterality Date  .  kidney stones    . ABDOMINAL ADHESION SURGERY    . ABDOMINAL HYSTERECTOMY  1995   partial- hemmoraged after surgery-stitch came loose  . APPENDECTOMY  1993   hemmoraged after surgery- stitch came loose  . CERVICAL DISC SURGERY  06/04/2001   with fusion  . CHOLECYSTECTOMY  1985  . colonoscopy    . COLONOSCOPY    . CYSTOSCOPY    . FOOT SURGERY     lt  . jj stent  07/2002  with ureteroscopy, cystoscopy and then removal of stent in office  . LITHOTRIPSY    . LYMPH NODE BIOPSY Right 03/06/2014   Procedure: right groin LYMPH NODE BIOPSY;  Surgeon: Merrie Roof, MD;  Location: [facility];  Service: General;  Laterality: Right;  . LYMPHADENECTOMY N/A 12/29/2015   Procedure: RETROPERITONEAL LYMPHADENECTOMY;  Surgeon: Alexis Frock, MD;  Location: [facility];  Service: Urology;  Laterality: N/A;  . NECK SURGERY  2002  . ROBOT ASSISTED LAPAROSCOPIC NEPHRECTOMY Left 12/29/2015   Procedure: XI ROBOTIC ASSISTED LEFT LAPAROSCOPIC NEPHRECTOMY;  Surgeon: Alexis Frock, MD;  Location: [facility];   Service: Urology;  Laterality: Left;  . TONSILLECTOMY  1976   Past Medical History:  Diagnosis Date  . Anxiety   . Arthritis   . Asthma   . Bipolar affective ([place])   . Calcifying tendinitis of shoulder   . Carpal tunnel syndrome   . Cervical facet syndrome   . Chronic pain syndrome   . Complication of anesthesia   . Depression   . Diverticulosis   . Falls   . Fibromyalgia   . 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 Full dentures   . Gout   . Headache(784.0)    migraines  . Herpesviral infection   . Hypertension   . IBS (irritable bowel syndrome)    with diarrhea  . Lymphadenopathy   . Myalgia and myositis, unspecified   . PONV (postoperative nausea and vomiting)   . PTSD (post-traumatic stress disorder)   . Renal calculi   . Restless legs syndrome (RLS)   . Thoracic radiculopathy   . Vitamin D deficiency   . Wears glasses    There were no vitals taken for this visit.  Opioid Risk Score:   Fall Risk Score:  `1  Depression screen PHQ 2/9  Depression screen [facility] 2/9 05/17/2016 05/04/2016 03/03/2016 12/01/2015 06/25/2015  Decreased Interest 2 2 0 2 2  Down, Depressed, Hopeless 2 2 0 2 2  PHQ - 2 Score 4 4 0 4 4  Some recent data might be hidden    Review of Systems  Constitutional: Positive for appetite change, diaphoresis and unexpected weight change.  HENT: Negative.   Eyes: Negative.   Respiratory: Positive for wheezing.   Cardiovascular: Positive for leg swelling.  Gastrointestinal: Positive for abdominal pain, constipation, diarrhea, nausea and vomiting.  Endocrine: Negative.   Musculoskeletal: Positive for arthralgias, back pain, gait problem, myalgias, neck pain and neck stiffness.       Spasms   Skin: Negative.   Allergic/Immunologic: Negative.   Neurological: Positive for dizziness, tremors, weakness, numbness and headaches.       Tingling vertigo  Hematological: Negative.   Psychiatric/Behavioral: Positive for  confusion and dysphoric mood. The patient is nervous/anxious.        Objective:   Physical Exam  Constitutional: She is oriented to person, place, and time. She appears well-developed and well-nourished.  HENT:  Head: Normocephalic and atraumatic.  Neck: Normal range of motion. Neck supple.  Cardiovascular: Normal rate and regular rhythm.   Pulmonary/Chest: Effort normal and breath sounds normal.  Musculoskeletal:  Normal Muscle Bulk and Muscle Testing Reveals: Upper Extremities: Full ROM and Muscle Strength on the Right 4/5 and Left 3/5 Thoracic and Lumbar Hypersensitivity Lower Extremities: Full ROM and Muscle Strength 5/5 Arises from Table with ease  Narrow Based gait   Neurological: She is alert and oriented to person, place, and time.  Skin: Skin  is warm and dry.  Psychiatric: She has a normal mood and affect.  Nursing note and vitals reviewed.         Assessment & Plan:  1. History of fibromyalgia with myofascial pain and multiple trigger points. Continue with Heat and exercise Regime. Continue with gabapentin. S/P Trigger point injection on 09/06/16 with Dr. Naaman Plummer, she had good relief noted. 2. Chronic migraine headaches.Continue to Monitor 3. Lumbar degenerative disk disease, L4-5 :  Refilled: Fentanyl Patch 25 mcg one patch every three days #10 and Hydrocodone 5/325 mg one tablet every 6 hours as needed #90.  4. History of Left Renal Mass: S/P Left Laparoscopic Nephrectomy 12/29/2015. Urology Following  5. 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  20 minutes of face to face patient care time was spent during this visit. All questions were encouraged and answered.   F/U in 1 month

## 2016-10-12 LAB — TOXASSURE SELECT,+ANTIDEPR,UR

## 2016-10-12 NOTE — Progress Notes (Signed)
Urine drug screen for this encounter is consistent for prescribed medication 

## 2016-10-19 ENCOUNTER — Telehealth: Payer: Self-pay | Admitting: Registered Nurse

## 2016-10-19 NOTE — Telephone Encounter (Signed)
On January 18,2018 NCCSR was reviewed: No conflict was seen on the [place] with Multiple Prescribers. Patty Salas has a signed  Narcotic Contract with our office. If there were any discrepancies this would have been  reported to her  Physcian.

## 2016-10-31 ENCOUNTER — Telehealth: Payer: Self-pay | Admitting: *Deleted

## 2016-10-31 DIAGNOSIS — M797 Fibromyalgia: Secondary | ICD-10-CM

## 2016-10-31 DIAGNOSIS — M4716 Other spondylosis with myelopathy, lumbar region: Secondary | ICD-10-CM

## 2016-10-31 DIAGNOSIS — M5136 Other intervertebral disc degeneration, lumbar region: Secondary | ICD-10-CM

## 2016-10-31 MED ORDER — FENTANYL 25 MCG/HR TD PT72: 25.0000 ug | MEDICATED_PATCH | TRANSDERMAL | 0 refills | Status: DC

## 2016-10-31 MED ORDER — HYDROCODONE-ACETAMINOPHEN 5-325 MG PO TABS: 1.0000 | ORAL_TABLET | Freq: Three times a day (TID) | ORAL | 0 refills | Status: DC | PRN

## 2016-10-31 NOTE — Telephone Encounter (Signed)
RXs printed for [place] to sign.  She has appt with Naaman Plummer 11/22/16. [place] notified.

## 2016-11-02 ENCOUNTER — Encounter: Payer: 59 | Admitting: Registered Nurse

## 2016-11-03 DIAGNOSIS — Z0000 Encounter for general adult medical examination without abnormal findings: Secondary | ICD-10-CM | POA: Diagnosis not present

## 2016-11-03 DIAGNOSIS — Z23 Encounter for immunization: Secondary | ICD-10-CM | POA: Diagnosis not present

## 2016-11-03 DIAGNOSIS — E559 Vitamin D deficiency, unspecified: Secondary | ICD-10-CM | POA: Diagnosis not present

## 2016-11-03 DIAGNOSIS — R197 Diarrhea, unspecified: Secondary | ICD-10-CM | POA: Diagnosis not present

## 2016-11-03 DIAGNOSIS — E7800 Pure hypercholesterolemia, unspecified: Secondary | ICD-10-CM | POA: Diagnosis not present

## 2016-11-03 DIAGNOSIS — E876 Hypokalemia: Secondary | ICD-10-CM | POA: Diagnosis not present

## 2016-11-03 LAB — HEPATIC FUNCTION PANEL
ALT: 40 — AB (ref 7–35)
ALT: 40 — AB (ref 7–35)
AST: 64 — AB (ref 13–35)
AST: 64 — AB (ref 13–35)
Alkaline Phosphatase: 117 (ref 25–125)
Alkaline Phosphatase: 117 (ref 25–125)
BILIRUBIN, TOTAL: 0.7
Bilirubin, Total: 0.7

## 2016-11-03 LAB — BASIC METABOLIC PANEL
BUN: 11 (ref 4–21)
BUN: 11 (ref 4–21)
CREATININE: 0.9 (ref 0.5–1.1)
CREATININE: 0.9 (ref 0.5–1.1)
Glucose: 87
Glucose: 87
POTASSIUM: 4.2 (ref 3.4–5.3)
SODIUM: 137 (ref 137–147)

## 2016-11-03 LAB — LIPID PANEL
CHOLESTEROL: 167 (ref 0–200)
Cholesterol: 167 (ref 0–200)
HDL: 51 (ref 35–70)
HDL: 51 (ref 35–70)
LDL CALC: 93
LDL Cholesterol: 93
TRIGLYCERIDES: 115 (ref 40–160)
TRIGLYCERIDES: 115 (ref 40–160)

## 2016-11-03 LAB — CBC AND DIFFERENTIAL
HCT: 36 (ref 36–46)
HCT: 36 (ref 36–46)
Hemoglobin: 12 (ref 12.0–16.0)
Hemoglobin: 12 (ref 12.0–16.0)
Platelets: 344 (ref 150–399)
Platelets: 344 (ref 150–399)
WBC: 7.2
WBC: 7.2

## 2016-11-03 LAB — TSH
TSH: 1.24 (ref 0.41–5.90)
TSH: 1.24 (ref 0.41–5.90)

## 2016-11-03 LAB — VITAMIN D 25 HYDROXY (VIT D DEFICIENCY, FRACTURES)
VIT D 25 HYDROXY: 37.7
Vit D, 25-Hydroxy: 37.7

## 2016-11-22 ENCOUNTER — Encounter: Payer: 59 | Attending: Physical Medicine and Rehabilitation | Admitting: Physical Medicine & Rehabilitation

## 2016-11-22 ENCOUNTER — Encounter: Payer: Self-pay | Admitting: Physical Medicine & Rehabilitation

## 2016-11-22 VITALS — BP 129/79 | HR 102 | Resp 14

## 2016-11-22 DIAGNOSIS — Z79899 Other long term (current) drug therapy: Secondary | ICD-10-CM | POA: Insufficient documentation

## 2016-11-22 DIAGNOSIS — G894 Chronic pain syndrome: Secondary | ICD-10-CM | POA: Insufficient documentation

## 2016-11-22 DIAGNOSIS — M5136 Other intervertebral disc degeneration, lumbar region: Secondary | ICD-10-CM | POA: Diagnosis present

## 2016-11-22 DIAGNOSIS — Z5181 Encounter for therapeutic drug level monitoring: Secondary | ICD-10-CM | POA: Insufficient documentation

## 2016-11-22 DIAGNOSIS — F0781 Postconcussional syndrome: Secondary | ICD-10-CM | POA: Diagnosis present

## 2016-11-22 DIAGNOSIS — M4716 Other spondylosis with myelopathy, lumbar region: Secondary | ICD-10-CM | POA: Diagnosis present

## 2016-11-22 DIAGNOSIS — G43519 Persistent migraine aura without cerebral infarction, intractable, without status migrainosus: Secondary | ICD-10-CM | POA: Insufficient documentation

## 2016-11-22 DIAGNOSIS — M797 Fibromyalgia: Secondary | ICD-10-CM

## 2016-11-22 DIAGNOSIS — M51369 Other intervertebral disc degeneration, lumbar region without mention of lumbar back pain or lower extremity pain: Secondary | ICD-10-CM

## 2016-11-22 MED ORDER — HYDROCODONE-ACETAMINOPHEN 5-325 MG PO TABS: 1.0000 | ORAL_TABLET | Freq: Three times a day (TID) | ORAL | 0 refills | Status: DC | PRN

## 2016-11-22 MED ORDER — FENTANYL 12 MCG/HR TD PT72: 12.5000 ug | MEDICATED_PATCH | TRANSDERMAL | 0 refills | Status: DC

## 2016-11-22 MED ORDER — FENTANYL 25 MCG/HR TD PT72: 25.0000 ug | MEDICATED_PATCH | TRANSDERMAL | 0 refills | Status: DC

## 2016-11-22 NOTE — Patient Instructions (Addendum)
PLEASE FEEL FREE TO CALL OUR OFFICE WITH ANY PROBLEMS OR QUESTIONS ([phone])      

## 2016-11-22 NOTE — Progress Notes (Signed)
Procedure Note: Trigger Point Injections:  DX: chronic myosfascial pain.    After informed consent and preparation of the skin with isopropyl alcohol, I injected the bilateral traps (3 access points each), levator scapulae (1 each), and left L3 paraspinals with 1- 2cc of 1% lidocaine. The patient tolerated well, and no complications were experienced. Post-injection instructions were provided.    Will decrease fentanyl patch to 50mcg. Refilled hydrocodone  Follow up with NP in 6 weeks.   See me in 8-10 weeks for migraine botox.

## 2016-12-01 ENCOUNTER — Encounter: Payer: 59 | Admitting: Registered Nurse

## 2016-12-01 ENCOUNTER — Ambulatory Visit (INDEPENDENT_AMBULATORY_CARE_PROVIDER_SITE_OTHER): Payer: 59 | Admitting: Podiatry

## 2016-12-01 ENCOUNTER — Encounter: Payer: Self-pay | Admitting: Podiatry

## 2016-12-01 DIAGNOSIS — Q828 Other specified congenital malformations of skin: Secondary | ICD-10-CM | POA: Diagnosis not present

## 2016-12-03 NOTE — Progress Notes (Signed)
Subjective:     Patient ID: Patty Salas, female   DOB: Jan 13, 1956, 61 y.o.   MRN: [id number]  HPI patient presents with multiple lesions plantar right foot   Review of Systems     Objective:   Physical Exam Neurovascular status intact with porokeratotic lesions right    Assessment:     Chronic porokeratotic lesions    Plan:     Debride lesions on right foot with no iatrogenic bleeding noted

## 2016-12-05 ENCOUNTER — Telehealth: Payer: Self-pay | Admitting: Hematology and Oncology

## 2016-12-05 NOTE — Telephone Encounter (Signed)
sw pt to sch f/u appt. Pt will call back to confirm appt 3/30 at 245 pm

## 2016-12-14 ENCOUNTER — Telehealth: Payer: Self-pay | Admitting: Hematology and Oncology

## 2016-12-14 ENCOUNTER — Telehealth: Payer: Self-pay | Admitting: *Deleted

## 2016-12-14 ENCOUNTER — Encounter: Payer: Self-pay | Admitting: Hematology

## 2016-12-14 NOTE — Telephone Encounter (Signed)
4/12 and 4/19  Are the only days she can't come in but needs to reschedule.  She has other dr appointments and lab work  Please call before scheduling   [phone]

## 2016-12-14 NOTE — Telephone Encounter (Signed)
I sent scheduling msg 

## 2016-12-14 NOTE — Telephone Encounter (Signed)
[place] called and the decrease in Fentanyl is not working.  She is in more pain. (she is resistant to taking more of her hydrocodone).  Please advise.

## 2016-12-14 NOTE — Telephone Encounter (Signed)
Error

## 2016-12-15 NOTE — Telephone Encounter (Signed)
Can she use 2 of the 12 mcg patches until she comes back in to see [name]?  She has an appt 12/27/16 which we maybe can move up--to avoid wasting the patches and have coverage over the weekend. She is due to put new patch on tomorrow.

## 2016-12-15 NOTE — Telephone Encounter (Signed)
We can try going back to the former dose of 26mcg. She would need to bring in old, un-used patches and we can provide a replacement rx. This could be an appt with Patty Salas

## 2016-12-15 NOTE — Telephone Encounter (Signed)
Per Dr Naaman Plummer she may use 2 of the 12 mcg patches every 3 days and when she comes in for appt with [name] new RX for 25 mcg fentanyl can be written .

## 2016-12-16 ENCOUNTER — Telehealth: Payer: Self-pay | Admitting: Hematology and Oncology

## 2016-12-16 NOTE — Telephone Encounter (Signed)
Per 3/15 los cxd 3/30 lab/fu and moved to April. Patient will hold off scheduling lab at this time due to just having wellness check that including las and a pending appointment with Dr. Tresa Moore 4/19 that may also include labs. Patient will bring all lab results with her and return to lab after 4/24 visit with NG if NG requires additional labs.   Patient has new f/u appointment for 4/24.

## 2016-12-21 ENCOUNTER — Telehealth: Payer: Self-pay | Admitting: Physical Medicine & Rehabilitation

## 2016-12-21 DIAGNOSIS — M797 Fibromyalgia: Secondary | ICD-10-CM

## 2016-12-21 DIAGNOSIS — M4716 Other spondylosis with myelopathy, lumbar region: Secondary | ICD-10-CM

## 2016-12-21 DIAGNOSIS — M51369 Other intervertebral disc degeneration, lumbar region without mention of lumbar back pain or lower extremity pain: Secondary | ICD-10-CM

## 2016-12-21 DIAGNOSIS — M5136 Other intervertebral disc degeneration, lumbar region: Secondary | ICD-10-CM

## 2016-12-21 MED ORDER — FENTANYL 25 MCG/HR TD PT72: 25.0000 ug | MEDICATED_PATCH | TRANSDERMAL | 0 refills | Status: DC

## 2016-12-21 MED ORDER — HYDROCODONE-ACETAMINOPHEN 5-325 MG PO TABS: 1.0000 | ORAL_TABLET | Freq: Three times a day (TID) | ORAL | 0 refills | Status: DC | PRN

## 2016-12-21 NOTE — Telephone Encounter (Signed)
Ptn phone states she is having chills and has had to take Phenegran - but chills may be related to med change wants to know if she should cancel appt.  tfer to nurse line.

## 2016-12-21 NOTE — Telephone Encounter (Signed)
We will cancel her appt and let her pick up prescriptions.  Will not need to get until Monday.  She will bring back leftover 12 mcg patch of Fentanyl to her next scheduled appt with Dr Naaman Plummer.  She is returning to her previous dose of Fentanyl 25 mcg q 72.

## 2016-12-22 ENCOUNTER — Encounter: Payer: 59 | Admitting: Registered Nurse

## 2016-12-25 ENCOUNTER — Other Ambulatory Visit: Payer: Self-pay | Admitting: Physical Medicine & Rehabilitation

## 2016-12-25 NOTE — Telephone Encounter (Signed)
Patient called at 3:34 pm 03.23.18 states [facility] on [address] trying to get prior auth on fentanyl patch due to change in micrograms - needs to speak to ET or someone to contact [facility]. Copying nurse and ET for follow up

## 2016-12-25 NOTE — Telephone Encounter (Signed)
Prior authorization  Submitted and approved. Patient notified

## 2016-12-27 ENCOUNTER — Ambulatory Visit: Payer: 59 | Admitting: Registered Nurse

## 2016-12-29 ENCOUNTER — Other Ambulatory Visit: Payer: 59

## 2016-12-29 ENCOUNTER — Ambulatory Visit: Payer: 59 | Admitting: Hematology and Oncology

## 2017-01-09 ENCOUNTER — Encounter: Payer: 59 | Admitting: Physical Medicine & Rehabilitation

## 2017-01-17 ENCOUNTER — Encounter: Payer: 59 | Attending: Physical Medicine and Rehabilitation | Admitting: Registered Nurse

## 2017-01-17 ENCOUNTER — Encounter: Payer: Self-pay | Admitting: Registered Nurse

## 2017-01-17 VITALS — BP 116/65 | HR 104

## 2017-01-17 DIAGNOSIS — M797 Fibromyalgia: Secondary | ICD-10-CM | POA: Diagnosis present

## 2017-01-17 DIAGNOSIS — Z5181 Encounter for therapeutic drug level monitoring: Secondary | ICD-10-CM | POA: Diagnosis not present

## 2017-01-17 DIAGNOSIS — M4716 Other spondylosis with myelopathy, lumbar region: Secondary | ICD-10-CM | POA: Diagnosis not present

## 2017-01-17 DIAGNOSIS — G43519 Persistent migraine aura without cerebral infarction, intractable, without status migrainosus: Secondary | ICD-10-CM | POA: Diagnosis present

## 2017-01-17 DIAGNOSIS — F0781 Postconcussional syndrome: Secondary | ICD-10-CM | POA: Diagnosis present

## 2017-01-17 DIAGNOSIS — M5136 Other intervertebral disc degeneration, lumbar region: Secondary | ICD-10-CM

## 2017-01-17 DIAGNOSIS — G894 Chronic pain syndrome: Secondary | ICD-10-CM | POA: Diagnosis not present

## 2017-01-17 DIAGNOSIS — Z79899 Other long term (current) drug therapy: Secondary | ICD-10-CM

## 2017-01-17 DIAGNOSIS — M47812 Spondylosis without myelopathy or radiculopathy, cervical region: Secondary | ICD-10-CM

## 2017-01-17 MED ORDER — HYDROCODONE-ACETAMINOPHEN 5-325 MG PO TABS: 1.0000 | ORAL_TABLET | Freq: Three times a day (TID) | ORAL | 0 refills | Status: DC | PRN

## 2017-01-17 MED ORDER — FENTANYL 25 MCG/HR TD PT72: 25.0000 ug | MEDICATED_PATCH | TRANSDERMAL | 0 refills | Status: DC

## 2017-01-17 NOTE — Progress Notes (Signed)
Subjective:    Patient ID: Patty Salas, female    DOB: 1956-02-08, 61 y.o.   MRN: [id number]  HPI: Patty Salas is a 61 year old female who returns for follow up appointment for chronic pain and medication refill. She states her pain is located in her neck and lowerback. She rates her pain 9.Her current exercise regime is performing stretching exercises with bands and walking. S/P Trigger Point Injection with good relief noted.  Last UDS was on 10/06/2016 was consistent, her Lorazepam ordered by Dr. Reece Levy.   Pain Inventory Average Pain 9 Pain Right Now 9 My pain is constant, sharp, burning, stabbing and aching  In the last 24 hours, has pain interfered with the following? General activity 9 Relation with others 9 Enjoyment of life 9 What TIME of day is your pain at its worst? all Sleep (in general) Fair  Pain is worse with: walking, bending, sitting, inactivity, standing and some activites Pain improves with: rest, heat/ice, therapy/exercise, medication and injections Relief from Meds: 6  Mobility walk without assistance how many minutes can you walk? 5-7 ability to climb steps?  yes do you drive?  yes  Function I need assistance with the following:  meal prep, household duties and shopping Do you have any goals in this area?  yes  Neuro/Psych bladder control problems bowel control problems weakness numbness tremor tingling trouble walking spasms dizziness confusion depression anxiety  Prior Studies Any changes since last visit?  no  Physicians involved in your care Any changes since last visit?  no   Family History  Problem Relation Age of Onset  . Cancer Mother   . Migraines Mother   . Heart disease Father   . Hypertension Father   . Cancer Father     colon  . Hypertension Brother   . Migraines Brother   . Hypertension Brother   . Migraines Brother   . Migraines Daughter    Social History   Social History  . Marital status:  Married    Spouse name: N/A  . Number of children: 1  . Years of education: COLLEGE1   Occupational History  . HOUSEWIFE Unemployed   Social History Main Topics  . Smoking status: Never Smoker  . Smokeless tobacco: Never Used  . Alcohol use No  . Drug use: No  . Sexual activity: Not Asked   Other Topics Concern  . None   Social History Narrative   Patient is right handed.   Patient drinks caffeine occasionally.   Past Surgical History:  Procedure Laterality Date  .  kidney stones    . ABDOMINAL ADHESION SURGERY    . ABDOMINAL HYSTERECTOMY  1995   partial- hemmoraged after surgery-stitch came loose  . APPENDECTOMY  1993   hemmoraged after surgery- stitch came loose  . CERVICAL DISC SURGERY  06/04/2001   with fusion  . CHOLECYSTECTOMY  1985  . colonoscopy    . COLONOSCOPY    . CYSTOSCOPY    . FOOT SURGERY     lt  . jj stent  07/2002   with ureteroscopy, cystoscopy and then removal of stent in office  . LITHOTRIPSY    . LYMPH NODE BIOPSY Right 03/06/2014   Procedure: right groin LYMPH NODE BIOPSY;  Surgeon: Merrie Roof, MD;  Location: [facility];  Service: General;  Laterality: Right;  . LYMPHADENECTOMY N/A 12/29/2015   Procedure: RETROPERITONEAL LYMPHADENECTOMY;  Surgeon: Alexis Frock, MD;  [facility];  Service: Urology;  Laterality: N/A;  . NECK SURGERY  2002  . ROBOT ASSISTED LAPAROSCOPIC NEPHRECTOMY Left 12/29/2015   Procedure: XI ROBOTIC ASSISTED LEFT LAPAROSCOPIC NEPHRECTOMY;  Surgeon: Alexis Frock, MD;  Location[facility];  Service: Urology;  Laterality: Left;  . TONSILLECTOMY  1976   Past Medical History:  Diagnosis Date  . Anxiety   . Arthritis   . Asthma   . Bipolar affective ([place])   . Calcifying tendinitis of shoulder   . Carpal tunnel syndrome   . Cervical facet syndrome ([place])   . Chronic pain syndrome   . Complication of anesthesia   . Depression   . Diverticulosis   . Falls   . Fibromyalgia   . Follicular lymphoma  grade I of intrapelvic lymph nodes ([place]) 02/22/2016  . Full dentures   . Gout   . Headache(784.0)    migraines  . Herpesviral infection   . Hypertension   . IBS (irritable bowel syndrome)    with diarrhea  . Lymphadenopathy   . Myalgia and myositis, unspecified   . PONV (postoperative nausea and vomiting)   . PTSD (post-traumatic stress disorder)   . Renal calculi   . Restless legs syndrome (RLS)   . Thoracic radiculopathy   . Vitamin D deficiency   . Wears glasses    BP 116/65 (BP Location: Right Arm, Patient Position: Sitting, Cuff Size: Normal)   Pulse (!) 104   SpO2 96%   Opioid Risk Score:   Fall Risk Score:  `1  Depression screen PHQ 2/9  Depression screen [facility] 2/9 01/17/2017 05/17/2016 05/04/2016 03/03/2016 12/01/2015 06/25/2015  Decreased Interest 2 2 2  0 2 2  Down, Depressed, Hopeless 2 2 2  0 2 2  PHQ - 2 Score 4 4 4  0 4 4  Some recent data might be hidden    Review of Systems  Constitutional: Positive for chills, diaphoresis and unexpected weight change.  HENT: Negative.   Eyes: Negative.   Respiratory: Negative.   Cardiovascular: Positive for leg swelling.  Gastrointestinal: Positive for diarrhea, nausea and vomiting.  Endocrine: Negative.   Genitourinary: Negative.   Musculoskeletal: Positive for gait problem and myalgias.  Skin: Negative.   Allergic/Immunologic: Negative.   Neurological: Positive for dizziness, tremors, weakness, numbness and headaches.  Hematological: Negative.   Psychiatric/Behavioral: Positive for confusion and dysphoric mood. The patient is nervous/anxious.   All other systems reviewed and are negative.      Objective:   Physical Exam  Constitutional: She is oriented to person, place, and time. She appears well-developed and well-nourished.  HENT:  Head: Normocephalic and atraumatic.  Neck: Normal range of motion. Neck supple.  Cervical Paraspinal Tenderness: C-5-C-6  Cardiovascular: Normal rate and regular rhythm.     Pulmonary/Chest: Effort normal and breath sounds normal.  Musculoskeletal:  Normal Muscle Bulk and Muscle Testing Reveals:  Upper Extremities: Full ROM and Muscle Strength 5/5 Bilateral AC Joint Tenderness Thoracic Hypersensitivity Lumbar Paraspinal Tenderness: L-3-L-5 Lower Extremities: Full ROM and Muscle Strength 5/5 Bilateral Lower Extremities Flexion Produces Pain into Lower Back Arises from Table with ease Narrow Based gait   Neurological: She is alert and oriented to person, place, and time.  Skin: Skin is warm and dry.  Psychiatric: She has a normal mood and affect.  Nursing note and vitals reviewed.         Assessment & Plan:  1. History of fibromyalgia with myofascial pain and multiple trigger points. Continue with Heat and exercise Regime. Continue with gabapentin. S/P Trigger  point injection on 02/02118 with Dr. Naaman Plummer, she had good relief noted. 01/17/2017 2. Chronic migraine headaches.Continue to Monitor. Awaiting on Botox approval. 01/17/2017 3. Lumbar Spondylosis/Lumbar degenerative disk disease, L4-5 : 01/17/2017 Refilled: Fentanyl Patch 25 mcg one patch every three days #10 and Hydrocodone 5/325 mg one tablet every 6 hours as needed #90.  4. History of Left Renal Mass: S/P Left Laparoscopic Nephrectomy 12/29/2015. Urology Following. 01/17/2017 5. 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 01/17/2017 6. Cervical Spondylosis: Continue with HEP and Continue to monitor. 01/17/2017  20 minutes of face to face patient care time was spent during this visit. All questions were encouraged and answered.   F/U in 1 month

## 2017-01-18 ENCOUNTER — Telehealth: Payer: Self-pay | Admitting: Registered Nurse

## 2017-01-18 NOTE — Telephone Encounter (Signed)
On 01/18/2017 the Patty Salas was reviewed no conflict was seen on the Patty Salas with multiple prescribers. Patty Salas has a signed narcotic contract with our office. If there were any discrepancies this would have been reported to her physician.

## 2017-01-21 LAB — TOXASSURE SELECT,+ANTIDEPR,UR

## 2017-01-23 ENCOUNTER — Telehealth: Payer: Self-pay | Admitting: *Deleted

## 2017-01-23 ENCOUNTER — Ambulatory Visit: Payer: 59 | Admitting: Hematology and Oncology

## 2017-01-23 ENCOUNTER — Telehealth: Payer: Self-pay | Admitting: Hematology and Oncology

## 2017-01-23 NOTE — Telephone Encounter (Signed)
Looks like her appt was rescheduled a few times as she is not ready Please ask her to call back in the summer when she is ready rather than rescheduling now

## 2017-01-23 NOTE — Telephone Encounter (Signed)
Called with below message, verbalized understanding. 

## 2017-01-23 NOTE — Telephone Encounter (Signed)
Patient called and cancelled appointment due to a migraine and will reschedule at a later time.  Patient states that she is having botox to help with migraine and will bring to notes from other doctor to her next appointment.  If  you would like to talk to her or make another appointment she left her number [phone] for you to call

## 2017-01-23 NOTE — Telephone Encounter (Signed)
Urine drug screen for this encounter is consistent for prescribed medication 

## 2017-02-05 ENCOUNTER — Telehealth: Payer: Self-pay

## 2017-02-05 MED ORDER — METHYLPREDNISOLONE 4 MG PO TBPK: ORAL_TABLET | ORAL | 0 refills | Status: DC

## 2017-02-05 NOTE — Telephone Encounter (Signed)
Medrol dose pack rx'ed

## 2017-02-05 NOTE — Telephone Encounter (Signed)
[name] notified.

## 2017-02-05 NOTE — Telephone Encounter (Signed)
Patient called, stated having right wrist pain and swelling related to her deQuervain in her wrist and was asking for a medrol dose pack

## 2017-02-16 ENCOUNTER — Encounter: Payer: 59 | Admitting: Registered Nurse

## 2017-02-21 ENCOUNTER — Encounter: Payer: 59 | Admitting: Registered Nurse

## 2017-02-21 ENCOUNTER — Telehealth: Payer: Self-pay

## 2017-02-21 NOTE — Telephone Encounter (Signed)
Patient states she can not come to today's appointment due to having a migraine. Patient states she will give [place] a call back to reschdule. Patient also states she would like a call back from the clinic staff regarding medications.

## 2017-02-21 NOTE — Telephone Encounter (Signed)
I spoke with Helene Kelp.  She will come in on Friday to see [place].

## 2017-02-23 ENCOUNTER — Encounter: Payer: Self-pay | Admitting: Registered Nurse

## 2017-02-23 ENCOUNTER — Encounter: Payer: 59 | Attending: Physical Medicine and Rehabilitation | Admitting: Registered Nurse

## 2017-02-23 VITALS — BP 129/87 | HR 100

## 2017-02-23 DIAGNOSIS — M797 Fibromyalgia: Secondary | ICD-10-CM

## 2017-02-23 DIAGNOSIS — M51369 Other intervertebral disc degeneration, lumbar region without mention of lumbar back pain or lower extremity pain: Secondary | ICD-10-CM

## 2017-02-23 DIAGNOSIS — G894 Chronic pain syndrome: Secondary | ICD-10-CM | POA: Diagnosis not present

## 2017-02-23 DIAGNOSIS — M5136 Other intervertebral disc degeneration, lumbar region: Secondary | ICD-10-CM

## 2017-02-23 DIAGNOSIS — Z5181 Encounter for therapeutic drug level monitoring: Secondary | ICD-10-CM

## 2017-02-23 DIAGNOSIS — M4716 Other spondylosis with myelopathy, lumbar region: Secondary | ICD-10-CM | POA: Diagnosis not present

## 2017-02-23 DIAGNOSIS — F0781 Postconcussional syndrome: Secondary | ICD-10-CM | POA: Insufficient documentation

## 2017-02-23 DIAGNOSIS — Z79899 Other long term (current) drug therapy: Secondary | ICD-10-CM | POA: Diagnosis present

## 2017-02-23 DIAGNOSIS — M654 Radial styloid tenosynovitis [de Quervain]: Secondary | ICD-10-CM

## 2017-02-23 DIAGNOSIS — G43519 Persistent migraine aura without cerebral infarction, intractable, without status migrainosus: Secondary | ICD-10-CM | POA: Diagnosis present

## 2017-02-23 MED ORDER — HYDROCODONE-ACETAMINOPHEN 5-325 MG PO TABS: 1.0000 | ORAL_TABLET | Freq: Three times a day (TID) | ORAL | 0 refills | Status: DC | PRN

## 2017-02-23 MED ORDER — FENTANYL 25 MCG/HR TD PT72: 25.0000 ug | MEDICATED_PATCH | TRANSDERMAL | 0 refills | Status: DC

## 2017-02-23 NOTE — Progress Notes (Signed)
Subjective:    Patient ID: Patty Salas, female    DOB: 02/03/1956, 61 y.o.   MRN: [id number]  HPI: [name] is a 61 year old female who returns for follow up appointmentfor chronic pain and medication refill. She states her pain is located in her neck, right wrist and lowerback. She asked for another medrol dose pak, she will be scheduled for Trigger Point Injection with Dr. Naaman Plummer  next month, she verbalizes understanding. She rates her pain 10. Her current exercise regime is performing stretching exercises with bands and walking.  Patty Salas states her mother-in-law passed on 02/21/2017.   Last UDS was on 01/17/2017 was consistent, her Lorazepam ordered by Dr. Reece Levy.  Pain Inventory Average Pain 10 Pain Right Now 10 My pain is constant, sharp, stabbing and aching  In the last 24 hours, has pain interfered with the following? General activity 10 Relation with others 10 Enjoyment of life 10 What TIME of day is your pain at its worst? all times Sleep (in general) Fair  Pain is worse with: walking, bending, sitting, inactivity, standing and some activites Pain improves with: rest, heat/ice, therapy/exercise, medication and injections Relief from Meds: 5  Mobility walk without assistance walk with assistance use a cane use a walker how many minutes can you walk? 5 ability to climb steps?  walk without assistance walk with assistance use a cane use a walker how many minutes can you walk? 5 ability to climb steps?  yes do you drive?  yes use a wheelchair needs help with transfers transfers alone Do you have any goals in this area?  yes do you drive?  yes use a wheelchair needs help with transfers transfers alone Do you have any goals in this area?  yes  Function I need assistance with the following:  meal prep, household duties and shopping Do you have any goals in this area?  yes  Neuro/Psych bladder control problems bowel control  problems weakness numbness tremor tingling trouble walking spasms dizziness confusion depression anxiety  Prior Studies Any changes since last visit?  no  Physicians involved in your care Any changes since last visit?  no   Family History  Problem Relation Age of Onset  . Cancer Mother   . Migraines Mother   . Heart disease Father   . Hypertension Father   . Cancer Father        colon  . Hypertension Brother   . Migraines Brother   . Hypertension Brother   . Migraines Brother   . Migraines Daughter    Social History   Social History  . Marital status: Married    Spouse name: N/A  . Number of children: 1  . Years of education: COLLEGE1   Occupational History  . HOUSEWIFE Unemployed   Social History Main Topics  . Smoking status: Never Smoker  . Smokeless tobacco: Never Used  . Alcohol use No  . Drug use: No  . Sexual activity: Not Asked   Other Topics Concern  . None   Social History Narrative   Patient is right handed.   Patient drinks caffeine occasionally.   Past Surgical History:  Procedure Laterality Date  .  kidney stones    . ABDOMINAL ADHESION SURGERY    . ABDOMINAL HYSTERECTOMY  1995   partial- hemmoraged after surgery-stitch came loose  . APPENDECTOMY  1993   hemmoraged after surgery- stitch came loose  . CERVICAL DISC SURGERY  06/04/2001   with fusion  .  CHOLECYSTECTOMY  1985  . colonoscopy    . COLONOSCOPY    . CYSTOSCOPY    . FOOT SURGERY     lt  . jj stent  07/2002   with ureteroscopy, cystoscopy and then removal of stent in office  . LITHOTRIPSY    . LYMPH NODE BIOPSY Right 03/06/2014   Procedure: right groin LYMPH NODE BIOPSY;  Surgeon: Merrie Roof, MD;  Location: [facility];  Service: General;  Laterality: Right;  . LYMPHADENECTOMY N/A 12/29/2015   Procedure: RETROPERITONEAL LYMPHADENECTOMY;  Surgeon: Alexis Frock, MD;  Location: [facility];  Service: Urology;  Laterality: N/A;  . NECK SURGERY  2002  .  ROBOT ASSISTED LAPAROSCOPIC NEPHRECTOMY Left 12/29/2015   Procedure: XI ROBOTIC ASSISTED LEFT LAPAROSCOPIC NEPHRECTOMY;  Surgeon: Alexis Frock, MD;  Location: [facility];  Service: Urology;  Laterality: Left;  . TONSILLECTOMY  1976   Past Medical History:  Diagnosis Date  . Anxiety   . Arthritis   . Asthma   . Bipolar affective ([place])   . Calcifying tendinitis of shoulder   . Carpal tunnel syndrome   . Cervical facet syndrome ([place])   . Chronic pain syndrome   . Complication of anesthesia   . Depression   . Diverticulosis   . Falls   . Fibromyalgia   . 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 Full dentures   . Gout   . Headache(784.0)    migraines  . Herpesviral infection   . Hypertension   . IBS (irritable bowel syndrome)    with diarrhea  . Lymphadenopathy   . Myalgia and myositis, unspecified   . PONV (postoperative nausea and vomiting)   . PTSD (post-traumatic stress disorder)   . Renal calculi   . Restless legs syndrome (RLS)   . Thoracic radiculopathy   . Vitamin D deficiency   . Wears glasses    BP 129/87   Pulse 100   SpO2 96%   Opioid Risk Score:   Fall Risk Score:  `1  Depression screen PHQ 2/9  Depression screen [facility] 2/9 01/17/2017 05/17/2016 05/04/2016 03/03/2016 12/01/2015 06/25/2015  Decreased Interest 2 2 2  0 2 2  Down, Depressed, Hopeless 2 2 2  0 2 2  PHQ - 2 Score 4 4 4  0 4 4  Some recent data might be hidden    Review of Systems  Constitutional: Positive for chills, diaphoresis, fever and unexpected weight change.  HENT: Negative.   Eyes: Negative.   Respiratory: Negative.   Cardiovascular: Positive for leg swelling.  Gastrointestinal: Positive for abdominal pain, constipation, diarrhea, nausea and vomiting.  Endocrine: Negative.   Genitourinary: Negative.   Musculoskeletal: Positive for back pain.  Skin: Negative.   Allergic/Immunologic: Negative.   Neurological: Positive for dizziness, tremors, weakness and  numbness.  Psychiatric/Behavioral: Positive for confusion and dysphoric mood. The patient is nervous/anxious.        Objective:   Physical Exam  Constitutional: She is oriented to person, place, and time. She appears well-developed and well-nourished.  HENT:  Head: Normocephalic and atraumatic.  Neck: Normal range of motion. Neck supple.  Cervical Paraspinal Tenderness: C-5-C-6  Cardiovascular: Normal rate and regular rhythm.   Pulmonary/Chest: Effort normal and breath sounds normal.  Musculoskeletal:  Normal Muscle Bulk and Muscle Testing Reveals: Upper Extremities: Full ROM and Muscle Strength 5/5 Thoracic Hypersensitivity: T-7-T-10 Lumbar Paraspinal Tenderness: L-3-L-5 Lower Extremities: Full ROM and Muscle Strength 5/5 Arises from Table Slowlyy using cane for support  Narrow Based Gait   Neurological: She is alert and oriented to person, place, and time.  Skin: Skin is warm and dry.  Psychiatric: She has a normal mood and affect.  Nursing note and vitals reviewed.         Assessment & Plan:  1. History of fibromyalgia with myofascial pain and multiple trigger points. Continue with Heat and exercise Regime. Continue with gabapentin. ScheduleTrigger point injection on 03/12/2017 with Dr. Naaman Plummer,.  02/23/2017 2. Chronic migraine headaches.Continue to Monitor. Awaiting on Botox approval. 02/23/2017 3. Lumbar Spondylosis/Lumbar degenerative disk disease, L4-5 : 02/23/2017 Refilled: Fentanyl Patch 25 mcg one patch every three days #10 andHydrocodone 5/325 mg one tablet every 6 hours as needed #90.  4. 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 Urology Following. 02/23/2017 5. 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 02/23/2017 6. Cervical Spondylosis: Continue with HEP and Continue to monitor. 02/23/2017  20 minutes of face to face patient care time was spent during this visit. All questions were encouraged and answered.   F/U in 1  month

## 2017-03-12 ENCOUNTER — Encounter: Payer: Self-pay | Admitting: Physical Medicine & Rehabilitation

## 2017-03-12 ENCOUNTER — Encounter: Payer: 59 | Attending: Physical Medicine and Rehabilitation | Admitting: Physical Medicine & Rehabilitation

## 2017-03-12 VITALS — BP 113/56 | HR 95

## 2017-03-12 DIAGNOSIS — M5136 Other intervertebral disc degeneration, lumbar region: Secondary | ICD-10-CM

## 2017-03-12 DIAGNOSIS — G5601 Carpal tunnel syndrome, right upper limb: Secondary | ICD-10-CM

## 2017-03-12 DIAGNOSIS — Z79899 Other long term (current) drug therapy: Secondary | ICD-10-CM | POA: Diagnosis not present

## 2017-03-12 DIAGNOSIS — M4716 Other spondylosis with myelopathy, lumbar region: Secondary | ICD-10-CM | POA: Insufficient documentation

## 2017-03-12 DIAGNOSIS — G894 Chronic pain syndrome: Secondary | ICD-10-CM | POA: Insufficient documentation

## 2017-03-12 DIAGNOSIS — M797 Fibromyalgia: Secondary | ICD-10-CM | POA: Insufficient documentation

## 2017-03-12 DIAGNOSIS — F0781 Postconcussional syndrome: Secondary | ICD-10-CM | POA: Insufficient documentation

## 2017-03-12 DIAGNOSIS — G43519 Persistent migraine aura without cerebral infarction, intractable, without status migrainosus: Secondary | ICD-10-CM | POA: Insufficient documentation

## 2017-03-12 DIAGNOSIS — Z5181 Encounter for therapeutic drug level monitoring: Secondary | ICD-10-CM | POA: Insufficient documentation

## 2017-03-12 MED ORDER — FENTANYL 25 MCG/HR TD PT72: 25.0000 ug | MEDICATED_PATCH | TRANSDERMAL | 0 refills | Status: DC

## 2017-03-12 MED ORDER — HYDROCODONE-ACETAMINOPHEN 5-325 MG PO TABS: 1.0000 | ORAL_TABLET | Freq: Three times a day (TID) | ORAL | 0 refills | Status: DC | PRN

## 2017-03-12 NOTE — Progress Notes (Signed)
PROCEDURE NOTE  DIAGNOSIS: Right CTS  INTERVENTION:  Right Carpal Tunnel Injection     After informed consent and preparation of the skin with betadine and isopropyl alcohol, I injected 6mg  (1cc) of celestone and 2cc of 1% lidocaine into  the right Carpal Tunnel via anterior approach. Additionally, aspiration was performed prior to injection. The patient tolerated well, and no complications were encountered. Afterward the area was cleaned and dressed. Post- injection instructions were provided.      Meredith Staggers, MD, [facility] 03/12/2017

## 2017-03-12 NOTE — Patient Instructions (Signed)
PLEASE FEEL FREE TO CALL OUR OFFICE WITH ANY PROBLEMS OR QUESTIONS ([phone])      

## 2017-03-14 ENCOUNTER — Ambulatory Visit (INDEPENDENT_AMBULATORY_CARE_PROVIDER_SITE_OTHER): Payer: 59 | Admitting: Podiatry

## 2017-03-14 ENCOUNTER — Encounter: Payer: Self-pay | Admitting: Podiatry

## 2017-03-14 DIAGNOSIS — L600 Ingrowing nail: Secondary | ICD-10-CM | POA: Diagnosis not present

## 2017-03-14 DIAGNOSIS — Q828 Other specified congenital malformations of skin: Secondary | ICD-10-CM

## 2017-03-15 NOTE — Progress Notes (Signed)
Subjective:    Patient ID: Patty Salas, female   DOB: 61 y.o.   MRN: [id number]   HPI patient has painful calluses    ROS      Objective:  Physical Exam neurovascular status intact with keratotic lesion sub-foot right over left     Assessment:    Chronic lesion formation     Plan:     Debridement of lesions and patient with very thin skin. She had a slight amount of bleeding on the plantar aspect right and I did go ahead and I applied Neosporin with sterile dressing and she'll begin soaks and will let us know if any issues should occur and if not we'll be seen back for regular visit

## 2017-04-18 ENCOUNTER — Telehealth: Payer: Self-pay | Admitting: Physical Medicine & Rehabilitation

## 2017-04-18 NOTE — Telephone Encounter (Signed)
PER JEAN AT [employer] THEY DO NOT ALLOW SYNVISC FOR ANY OF THE DIAGNOSIS ATTACHED TO [place] -SEE MY EMAIL IF YOU WANT TO APPEAL

## 2017-04-24 ENCOUNTER — Encounter: Payer: Self-pay | Admitting: Physical Medicine & Rehabilitation

## 2017-04-24 ENCOUNTER — Encounter: Payer: 59 | Attending: Physical Medicine and Rehabilitation | Admitting: Physical Medicine & Rehabilitation

## 2017-04-24 ENCOUNTER — Other Ambulatory Visit: Payer: Self-pay | Admitting: *Deleted

## 2017-04-24 VITALS — BP 153/80 | HR 107

## 2017-04-24 DIAGNOSIS — Z5181 Encounter for therapeutic drug level monitoring: Secondary | ICD-10-CM | POA: Insufficient documentation

## 2017-04-24 DIAGNOSIS — M5136 Other intervertebral disc degeneration, lumbar region: Secondary | ICD-10-CM

## 2017-04-24 DIAGNOSIS — M797 Fibromyalgia: Secondary | ICD-10-CM | POA: Diagnosis not present

## 2017-04-24 DIAGNOSIS — F0781 Postconcussional syndrome: Secondary | ICD-10-CM | POA: Insufficient documentation

## 2017-04-24 DIAGNOSIS — G5601 Carpal tunnel syndrome, right upper limb: Secondary | ICD-10-CM

## 2017-04-24 DIAGNOSIS — Z79899 Other long term (current) drug therapy: Secondary | ICD-10-CM | POA: Insufficient documentation

## 2017-04-24 DIAGNOSIS — M4716 Other spondylosis with myelopathy, lumbar region: Secondary | ICD-10-CM | POA: Diagnosis present

## 2017-04-24 DIAGNOSIS — G894 Chronic pain syndrome: Secondary | ICD-10-CM | POA: Insufficient documentation

## 2017-04-24 DIAGNOSIS — G43519 Persistent migraine aura without cerebral infarction, intractable, without status migrainosus: Secondary | ICD-10-CM | POA: Diagnosis present

## 2017-04-24 DIAGNOSIS — M51369 Other intervertebral disc degeneration, lumbar region without mention of lumbar back pain or lower extremity pain: Secondary | ICD-10-CM

## 2017-04-24 MED ORDER — FENTANYL 25 MCG/HR TD PT72: 25.0000 ug | MEDICATED_PATCH | TRANSDERMAL | 0 refills | Status: DC

## 2017-04-24 MED ORDER — HYDROCODONE-ACETAMINOPHEN 5-325 MG PO TABS: 1.0000 | ORAL_TABLET | Freq: Three times a day (TID) | ORAL | 0 refills | Status: DC | PRN

## 2017-04-24 NOTE — Patient Instructions (Signed)
PLEASE FEEL FREE TO CALL OUR OFFICE WITH ANY PROBLEMS OR QUESTIONS ([phone])      

## 2017-04-24 NOTE — Progress Notes (Signed)
PROCEDURE NOTE:  PROCEDURE: TRIGGER POINT INJECTIONS  INDICATION: MYOFASCIAL PAIN   After informed consent and preparation of the skin with isopropyl alcohol, I injected the bilateral splenius capitis, left mid trap and left L4 paraspinals each with 2cc of 1% lidocaine. The patient tolerated well, and no complications were experienced. Post-injection instructions were provided. A total 4 areas were injected.  Refilled fentanyl and hydrocodone.   Will have her return in about a month for botox migraine

## 2017-05-30 ENCOUNTER — Encounter: Payer: Self-pay | Admitting: Physical Medicine & Rehabilitation

## 2017-05-30 ENCOUNTER — Encounter: Payer: 59 | Attending: Physical Medicine and Rehabilitation | Admitting: Physical Medicine & Rehabilitation

## 2017-05-30 VITALS — BP 142/82 | HR 96 | Resp 14

## 2017-05-30 DIAGNOSIS — Z5181 Encounter for therapeutic drug level monitoring: Secondary | ICD-10-CM | POA: Insufficient documentation

## 2017-05-30 DIAGNOSIS — Z79899 Other long term (current) drug therapy: Secondary | ICD-10-CM | POA: Insufficient documentation

## 2017-05-30 DIAGNOSIS — G43519 Persistent migraine aura without cerebral infarction, intractable, without status migrainosus: Secondary | ICD-10-CM | POA: Diagnosis not present

## 2017-05-30 DIAGNOSIS — M4716 Other spondylosis with myelopathy, lumbar region: Secondary | ICD-10-CM | POA: Diagnosis not present

## 2017-05-30 DIAGNOSIS — M797 Fibromyalgia: Secondary | ICD-10-CM | POA: Diagnosis not present

## 2017-05-30 DIAGNOSIS — G5601 Carpal tunnel syndrome, right upper limb: Secondary | ICD-10-CM

## 2017-05-30 DIAGNOSIS — G894 Chronic pain syndrome: Secondary | ICD-10-CM | POA: Insufficient documentation

## 2017-05-30 DIAGNOSIS — M5136 Other intervertebral disc degeneration, lumbar region: Secondary | ICD-10-CM | POA: Insufficient documentation

## 2017-05-30 DIAGNOSIS — F0781 Postconcussional syndrome: Secondary | ICD-10-CM | POA: Insufficient documentation

## 2017-05-30 DIAGNOSIS — M51369 Other intervertebral disc degeneration, lumbar region without mention of lumbar back pain or lower extremity pain: Secondary | ICD-10-CM

## 2017-05-30 MED ORDER — FENTANYL 25 MCG/HR TD PT72: 25.0000 ug | MEDICATED_PATCH | TRANSDERMAL | 0 refills | Status: DC

## 2017-05-30 MED ORDER — HYDROCODONE-ACETAMINOPHEN 5-325 MG PO TABS: 1.0000 | ORAL_TABLET | Freq: Three times a day (TID) | ORAL | 0 refills | Status: DC | PRN

## 2017-05-30 NOTE — Patient Instructions (Signed)
PLEASE FEEL FREE TO CALL OUR OFFICE WITH ANY PROBLEMS OR QUESTIONS ([phone])      

## 2017-05-30 NOTE — Progress Notes (Signed)
Botox Injection for chronic migraine headaches ICD 10: Migraine aura, persistent, intractable    Dilution: 100 Units/ 73ml preservative free NS Indication: refractory headaches (At least 15 days per month/headache lasting greater than 4 hours per day) incompletely responsive to other more conservative measures.  Informed consent was obtained after describing risks and benefits of the procedure with the patient. This includes bleeding, bruising, infection, excessive weakness, or medication side effects. A REMS form is on file and signed. Needle: 30g 1/2 inch needle   Number of units per muscle:  Right temporalis 20 units, 4 access points Left temporalis 20 units,  4 access points Right frontalis 10 units, 2 access points Left frontalis 10 units, 2 access points Procerus 5 units, 1 access point Right corrugator 5 units, 1 access point Left corrugator 5 units, 1 access point Right occipitalis 15 units, 3 access points Left occipitalis 15 units, 3 access points Right cervical paraspinal 10 units, 2 access points Left cervical paraspinals 10 units, 2 access points  Right trapezius 15 units, 3 access points Left trapezius 15 units, 3 access points   25 units was injected in the bilateral mid traps and 20 units was wasted    All injections were done after  after negative drawback for blood. The patient tolerated the procedure well. Post procedure instructions were given. Return in about 1 month (around 06/30/2017) for NP visit.

## 2017-06-13 ENCOUNTER — Ambulatory Visit (INDEPENDENT_AMBULATORY_CARE_PROVIDER_SITE_OTHER): Payer: 59 | Admitting: Podiatry

## 2017-06-13 ENCOUNTER — Encounter: Payer: Self-pay | Admitting: Podiatry

## 2017-06-13 DIAGNOSIS — Q828 Other specified congenital malformations of skin: Secondary | ICD-10-CM

## 2017-06-13 NOTE — Progress Notes (Signed)
Subjective:    Patient ID: Patty Salas, female   DOB: 61 y.o.   MRN: [id number]   HPI patient presents with chronic cervical and pain right    ROS      Objective:  Physical Exam porokeratotic lesions that are painful right     Assessment:   Chronic lesion formation right     Plan:   Debris lesions with no iatrogenic bleeding noted

## 2017-06-28 ENCOUNTER — Encounter: Payer: 59 | Admitting: Registered Nurse

## 2017-07-03 ENCOUNTER — Encounter: Payer: 59 | Admitting: Registered Nurse

## 2017-07-05 ENCOUNTER — Encounter: Payer: Self-pay | Admitting: Registered Nurse

## 2017-07-05 ENCOUNTER — Encounter: Payer: 59 | Attending: Physical Medicine and Rehabilitation | Admitting: Registered Nurse

## 2017-07-05 ENCOUNTER — Telehealth: Payer: Self-pay | Admitting: Registered Nurse

## 2017-07-05 VITALS — BP 132/79 | HR 104

## 2017-07-05 DIAGNOSIS — M797 Fibromyalgia: Secondary | ICD-10-CM

## 2017-07-05 DIAGNOSIS — M4716 Other spondylosis with myelopathy, lumbar region: Secondary | ICD-10-CM

## 2017-07-05 DIAGNOSIS — G5601 Carpal tunnel syndrome, right upper limb: Secondary | ICD-10-CM | POA: Diagnosis not present

## 2017-07-05 DIAGNOSIS — M542 Cervicalgia: Secondary | ICD-10-CM | POA: Diagnosis not present

## 2017-07-05 DIAGNOSIS — F0781 Postconcussional syndrome: Secondary | ICD-10-CM | POA: Diagnosis present

## 2017-07-05 DIAGNOSIS — Z5181 Encounter for therapeutic drug level monitoring: Secondary | ICD-10-CM

## 2017-07-05 DIAGNOSIS — G43519 Persistent migraine aura without cerebral infarction, intractable, without status migrainosus: Secondary | ICD-10-CM

## 2017-07-05 DIAGNOSIS — Z79899 Other long term (current) drug therapy: Secondary | ICD-10-CM

## 2017-07-05 DIAGNOSIS — M5412 Radiculopathy, cervical region: Secondary | ICD-10-CM | POA: Diagnosis not present

## 2017-07-05 DIAGNOSIS — G8929 Other chronic pain: Secondary | ICD-10-CM

## 2017-07-05 DIAGNOSIS — M5136 Other intervertebral disc degeneration, lumbar region: Secondary | ICD-10-CM | POA: Insufficient documentation

## 2017-07-05 DIAGNOSIS — M51369 Other intervertebral disc degeneration, lumbar region without mention of lumbar back pain or lower extremity pain: Secondary | ICD-10-CM

## 2017-07-05 DIAGNOSIS — M47812 Spondylosis without myelopathy or radiculopathy, cervical region: Secondary | ICD-10-CM

## 2017-07-05 DIAGNOSIS — G894 Chronic pain syndrome: Secondary | ICD-10-CM

## 2017-07-05 DIAGNOSIS — M25562 Pain in left knee: Secondary | ICD-10-CM

## 2017-07-05 DIAGNOSIS — M545 Low back pain: Secondary | ICD-10-CM

## 2017-07-05 MED ORDER — FENTANYL 25 MCG/HR TD PT72: 25.0000 ug | MEDICATED_PATCH | TRANSDERMAL | 0 refills | Status: DC

## 2017-07-05 MED ORDER — HYDROCODONE-ACETAMINOPHEN 5-325 MG PO TABS: 1.0000 | ORAL_TABLET | Freq: Three times a day (TID) | ORAL | 0 refills | Status: DC | PRN

## 2017-07-05 NOTE — Progress Notes (Signed)
Subjective:    Patient ID: Patty Salas, female    DOB: [birth year], 61 y.o.   MRN: [id number]  HPI: [name] is a 61 year old female who returns for follow up appointmentfor chronic pain and medication refill. She states her pain is located in her neck radiating into her bilateral shoulders,mid-lowerback pain and left knee pain. [name] states her left knee pain has increased in intensity, stated she had a fall two months ago, and has noticed increase intensity of pain. Will order left knee X-ray, she verbalizes understanding. She rates her pain 10. Her current exercise regime is performing stretching exercises with bands and walking.  [name] status post Botox for migraines, with some relief noted.   [name] Morphine equivalent is  75 MME.  She is also prescribed lorazepam by Dr. Reece Levy. We have discussed the black box warning of using opioids and benzodiazepines. I highlighted the dangers of using these drugs together and discussed the adverse events including respiratory suppression, overdose, cognitive impairment and importance of  compliance with current regimen. she verbalizes understanding, we will continue to monitor and adjust as indicated.  She is being closely monitored and under the care of her psychiatrist Dr. Reece Levy.  Also reports her PCP Dr. Kenton Kingfisher has discharged her from his office due to cancellation appointments, she states she had to  cancel the appointments due to migraines. [name] was encouraged to obtain a PCP she verbalizes understanding.   Last UDS was on 01/17/2017 was consistent, her Lorazepam ordered by Dr. Reece Levy.  Pain Inventory Average Pain 9 Pain Right Now 10 My pain is constant, sharp, stabbing and aching  In the last 24 hours, has pain interfered with the following? General activity 9 Relation with others 10 Enjoyment of life 10 What TIME of day is your pain at its worst? all times Sleep (in general) Fair  Pain is worse with: walking,  bending, sitting, inactivity, standing and some activites Pain improves with: rest, heat/ice, therapy/exercise, medication and injections Relief from Meds: 6  Mobility walk without assistance walk with assistance use a cane use a walker how many minutes can you walk? 5-10 ability to climb steps?  no do you drive?  yes use a wheelchair needs help with transfers transfers alone Do you have any goals in this area?  yes  Function I need assistance with the following:  meal prep, household duties and shopping Do you have any goals in this area?  yes  Neuro/Psych bladder control problems bowel control problems weakness numbness tremor tingling trouble walking spasms dizziness confusion depression anxiety  Prior Studies Any changes since last visit?  no  Physicians involved in your care Any changes since last visit?  no   Family History  Problem Relation Age of Onset  . Cancer Mother   . Migraines Mother   . Heart disease Father   . Hypertension Father   . Cancer Father        colon  . Hypertension Brother   . Migraines Brother   . Hypertension Brother   . Migraines Brother   . Migraines Daughter    Social History   Social History  . Marital status: Married    Spouse name: N/A  . Number of children: 1  . Years of education: COLLEGE1   Occupational History  . HOUSEWIFE Unemployed   Social History Main Topics  . Smoking status: Never Smoker  . Smokeless tobacco: Never Used  . Alcohol use No  .  Drug use: No  . Sexual activity: Not on file   Other Topics Concern  . Not on file   Social History Narrative   Patient is right handed.   Patient drinks caffeine occasionally.   Past Surgical History:  Procedure Laterality Date  .  kidney stones    . ABDOMINAL ADHESION SURGERY    . ABDOMINAL HYSTERECTOMY  1995   partial- hemmoraged after surgery-stitch came loose  . APPENDECTOMY  1993   hemmoraged after surgery- stitch came loose  . CERVICAL DISC  SURGERY  06/04/2001   with fusion  . CHOLECYSTECTOMY  1985  . colonoscopy    . COLONOSCOPY    . CYSTOSCOPY    . FOOT SURGERY     lt  . jj stent  07/2002   with ureteroscopy, cystoscopy and then removal of stent in office  . LITHOTRIPSY    . LYMPH NODE BIOPSY Right 03/06/2014   Procedure: right groin LYMPH NODE BIOPSY;  Surgeon: Merrie Roof, MD;  Location: [facility];  Service: General;  Laterality: Right;  . LYMPHADENECTOMY N/A 12/29/2015   Procedure: RETROPERITONEAL LYMPHADENECTOMY;  Surgeon: Alexis Frock, MD;  [facility];  Service: Urology;  Laterality: N/A;  . NECK SURGERY  2002  . ROBOT ASSISTED LAPAROSCOPIC NEPHRECTOMY Left 12/29/2015   Procedure: XI ROBOTIC ASSISTED LEFT LAPAROSCOPIC NEPHRECTOMY;  Surgeon: Alexis Frock, MD;  [facility];  Service: Urology;  Laterality: Left;  . TONSILLECTOMY  1976   Past Medical History:  Diagnosis Date  . Anxiety   . Arthritis   . Asthma   . Bipolar affective ([place])   . Calcifying tendinitis of shoulder   . Carpal tunnel syndrome   . Cervical facet syndrome ([place])   . Chronic pain syndrome   . Complication of anesthesia   . Depression   . Diverticulosis   . Falls   . Fibromyalgia   . 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 Full dentures   . Gout   . Headache(784.0)    migraines  . Herpesviral infection   . Hypertension   . IBS (irritable bowel syndrome)    with diarrhea  . Lymphadenopathy   . Myalgia and myositis, unspecified   . PONV (postoperative nausea and vomiting)   . PTSD (post-traumatic stress disorder)   . Renal calculi   . Restless legs syndrome (RLS)   . Thoracic radiculopathy   . Vitamin D deficiency   . Wears glasses    BP 132/79   Pulse (!) 104   SpO2 96%   Opioid Risk Score:11   Fall Risk Score:  `1  Depression screen PHQ 2/9  Depression screen [facility] 2/9 07/05/2017 04/24/2017 01/17/2017 05/17/2016 05/04/2016 03/03/2016 12/01/2015  Decreased Interest  3 2 2 2 2  0 2  Down, Depressed, Hopeless 3 2 2 2 2  0 2  PHQ - 2 Score 6 4 4 4 4  0 4  Some recent data might be hidden    Review of Systems  Constitutional: Positive for chills, diaphoresis, fever and unexpected weight change.  HENT: Negative.   Eyes: Negative.   Respiratory: Negative.   Cardiovascular: Positive for leg swelling.  Gastrointestinal: Positive for abdominal pain, constipation, diarrhea, nausea and vomiting.  Endocrine: Negative.   Genitourinary: Negative.   Musculoskeletal: Positive for back pain.  Skin: Negative.   Allergic/Immunologic: Negative.   Neurological: Positive for dizziness, tremors, weakness and numbness.  Psychiatric/Behavioral: Positive for confusion and dysphoric mood. The patient is  nervous/anxious.        Objective:   Physical Exam  Constitutional: She is oriented to person, place, and time. She appears well-developed and well-nourished.  HENT:  Head: Normocephalic and atraumatic.  Neck: Normal range of motion. Neck supple.  Cervical Paraspinal Tenderness: C-5-C-6  Cardiovascular: Normal rate and regular rhythm.   Pulmonary/Chest: Effort normal and breath sounds normal.  Musculoskeletal:  Normal Muscle Bulk and Muscle Testing Reveals: Upper Extremities: Full  ROM and Muscle Strength 5/5 Thoracic Paraspinal Tenderness: T-7-T-9 Lumbar Paraspinal Tenderness: L-3-L-5 Lower Extremities: Right: Full ROM and Muscle Strength 5/5 Left: Decreased ROM and Muscle Strength 4/5  Left Lower Extremity Flexion Produces Pain into Patella Arises from Table Slowlyy using cane for support Narrow Based Gait   Neurological: She is alert and oriented to person, place, and time.  Skin: Skin is warm and dry.  Psychiatric: She has a normal mood and affect.  Nursing note and vitals reviewed.         Assessment & Plan:  1. History of fibromyalgia with myofascial pain and multiple trigger points. Continue with Heat and exercise Regime. Continue with gabapentin.  07/05/2017 2. Chronic migraine headaches.Continue to Monitor. S/P Botox.  07/05/2017 3. Midline Low Back Pain/ Lumbar Spondylosis/Lumbar degenerative disk disease, L4-5 : Refilled: Fentanyl Patch 25 mcg one patch every three days #10 andHydrocodone 5/325 mg one tablet every 6 hours as needed #90. 07/05/2017 4. 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 Urology Following. 07/05/2017 5. 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 07/05/2017 6. Cervicalgia/ Cervical RadiculitisCervical Spondylosis: Continue Gabapentin. Continue  with HEP and Continue to monitor. 07/05/2017 7.Acute on Chronic Left Knee Pain: RX: X-ray  40 minutes of face to face patient care time was spent during this visit. All questions were encouraged and answered.   F/U in 1 month

## 2017-07-05 NOTE — Telephone Encounter (Signed)
On 07/05/2017 the  [place] was reviewed no conflict was seen on the [place] with multiple prescribers. Patty Salas has a signed narcotic contract with our office. If there were any discrepancies this would have been reported to her physician.

## 2017-07-30 ENCOUNTER — Telehealth: Payer: Self-pay | Admitting: *Deleted

## 2017-07-30 NOTE — Telephone Encounter (Signed)
Prior auth for Fentanyl 25 mcg initiated and approved From [facility] from   11/25/16-12/25/17

## 2017-08-02 ENCOUNTER — Encounter: Payer: Self-pay | Admitting: Registered Nurse

## 2017-08-02 ENCOUNTER — Encounter: Payer: 59 | Attending: Physical Medicine and Rehabilitation | Admitting: Registered Nurse

## 2017-08-02 VITALS — BP 137/80 | HR 101

## 2017-08-02 DIAGNOSIS — G43519 Persistent migraine aura without cerebral infarction, intractable, without status migrainosus: Secondary | ICD-10-CM | POA: Diagnosis present

## 2017-08-02 DIAGNOSIS — F0781 Postconcussional syndrome: Secondary | ICD-10-CM | POA: Insufficient documentation

## 2017-08-02 DIAGNOSIS — M542 Cervicalgia: Secondary | ICD-10-CM | POA: Diagnosis not present

## 2017-08-02 DIAGNOSIS — M47812 Spondylosis without myelopathy or radiculopathy, cervical region: Secondary | ICD-10-CM | POA: Diagnosis not present

## 2017-08-02 DIAGNOSIS — G894 Chronic pain syndrome: Secondary | ICD-10-CM | POA: Diagnosis not present

## 2017-08-02 DIAGNOSIS — Z5181 Encounter for therapeutic drug level monitoring: Secondary | ICD-10-CM | POA: Insufficient documentation

## 2017-08-02 DIAGNOSIS — M545 Low back pain: Secondary | ICD-10-CM

## 2017-08-02 DIAGNOSIS — G5601 Carpal tunnel syndrome, right upper limb: Secondary | ICD-10-CM

## 2017-08-02 DIAGNOSIS — M4716 Other spondylosis with myelopathy, lumbar region: Secondary | ICD-10-CM | POA: Diagnosis present

## 2017-08-02 DIAGNOSIS — Z79899 Other long term (current) drug therapy: Secondary | ICD-10-CM | POA: Insufficient documentation

## 2017-08-02 DIAGNOSIS — M5136 Other intervertebral disc degeneration, lumbar region: Secondary | ICD-10-CM | POA: Diagnosis present

## 2017-08-02 DIAGNOSIS — M797 Fibromyalgia: Secondary | ICD-10-CM | POA: Diagnosis present

## 2017-08-02 DIAGNOSIS — M5412 Radiculopathy, cervical region: Secondary | ICD-10-CM

## 2017-08-02 MED ORDER — HYDROCODONE-ACETAMINOPHEN 5-325 MG PO TABS: 1.0000 | ORAL_TABLET | Freq: Three times a day (TID) | ORAL | 0 refills | Status: DC | PRN

## 2017-08-02 MED ORDER — FENTANYL 25 MCG/HR TD PT72: 25.0000 ug | MEDICATED_PATCH | TRANSDERMAL | 0 refills | Status: DC

## 2017-08-02 NOTE — Progress Notes (Signed)
Subjective:    Patient ID: Patty Salas, female    DOB: [birth year], 61 y.o.   MRN: [id number]  HPI: Patty Salas is a 61 year old female who returns for follow up appointmentfor chronic pain and medication refill. She states her pain is located in her neck radiating into her bilateral shoulders,mid-lowerback pain.  She rates her pain 10. Her current exercise regime is performing stretching exercises with bands and walking.  [name] status post Botox for migraines, with some relief noted. Reports today the Botox has causedneck stiffness. Also states she doesn't want to receive any more Botox Injections.   [name] Morphine equivalent is  75 MME.  She is also prescribed lorazepam by Dr. Reece Levy. We have discussed the black box warning of using opioids and benzodiazepines. I highlighted the dangers of using these drugs together and discussed the adverse events including respiratory suppression, overdose, cognitive impairment and importance of  compliance with current regimen. she verbalizes understanding, we will continue to monitor and adjust as indicated.  She is being closely monitored and under the care of her psychiatrist Dr. Reece Levy.  Last UDS was on 01/17/2017 was consistent, her Lorazepam ordered by Dr. Reece Levy.  Pain Inventory Average Pain 9 Pain Right Now 10 My pain is constant, sharp, stabbing and aching  In the last 24 hours, has pain interfered with the following? General activity 10 Relation with others 10 Enjoyment of life 10 What TIME of day is your pain at its worst? all times Sleep (in general) Fair  Pain is worse with: walking, bending, sitting, inactivity, standing and some activites Pain improves with: rest, heat/ice, therapy/exercise, medication and injections Relief from Meds: 5  Mobility walk without assistance walk with assistance use a cane use a walker how many minutes can you walk? 5-10 ability to climb steps?  yes do you drive?  yes use a  wheelchair needs help with transfers transfers alone Do you have any goals in this area?  yes  Function I need assistance with the following:  meal prep, household duties and shopping Do you have any goals in this area?  yes  Neuro/Psych bladder control problems bowel control problems weakness numbness tremor tingling trouble walking spasms dizziness confusion depression anxiety  Prior Studies Any changes since last visit?  no  Physicians involved in your care Any changes since last visit?  no   Family History  Problem Relation Age of Onset  . Cancer Mother   . Migraines Mother   . Heart disease Father   . Hypertension Father   . Cancer Father        colon  . Hypertension Brother   . Migraines Brother   . Hypertension Brother   . Migraines Brother   . Migraines Daughter    Social History   Social History  . Marital status: Married    Spouse name: N/A  . Number of children: 1  . Years of education: COLLEGE1   Occupational History  . HOUSEWIFE Unemployed   Social History Main Topics  . Smoking status: Never Smoker  . Smokeless tobacco: Never Used  . Alcohol use No  . Drug use: No  . Sexual activity: Not on file   Other Topics Concern  . Not on file   Social History Narrative   Patient is right handed.   Patient drinks caffeine occasionally.   Past Surgical History:  Procedure Laterality Date  .  kidney stones    . ABDOMINAL ADHESION SURGERY    .  ABDOMINAL HYSTERECTOMY  1995   partial- hemmoraged after surgery-stitch came loose  . APPENDECTOMY  1993   hemmoraged after surgery- stitch came loose  . CERVICAL DISC SURGERY  06/04/2001   with fusion  . CHOLECYSTECTOMY  1985  . colonoscopy    . COLONOSCOPY    . CYSTOSCOPY    . FOOT SURGERY     lt  . jj stent  07/2002   with ureteroscopy, cystoscopy and then removal of stent in office  . LITHOTRIPSY    . LYMPH NODE BIOPSY Right 03/06/2014   Procedure: right groin LYMPH NODE BIOPSY;   Surgeon: Merrie Roof, MD;  Location: [facility];  Service: General;  Laterality: Right;  . LYMPHADENECTOMY N/A 12/29/2015   Procedure: RETROPERITONEAL LYMPHADENECTOMY;  Surgeon: Alexis Frock, MD;  Location: [facility];  Service: Urology;  Laterality: N/A;  . NECK SURGERY  2002  . ROBOT ASSISTED LAPAROSCOPIC NEPHRECTOMY Left 12/29/2015   Procedure: XI ROBOTIC ASSISTED LEFT LAPAROSCOPIC NEPHRECTOMY;  Surgeon: Alexis Frock, MD;  Location: [facility];  Service: Urology;  Laterality: Left;  . TONSILLECTOMY  1976   Past Medical History:  Diagnosis Date  . Anxiety   . Arthritis   . Asthma   . Bipolar affective ([place])   . Calcifying tendinitis of shoulder   . Carpal tunnel syndrome   . Cervical facet syndrome   . Chronic pain syndrome   . Complication of anesthesia   . Depression   . Diverticulosis   . Falls   . Fibromyalgia   . 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 Full dentures   . Gout   . Headache(784.0)    migraines  . Herpesviral infection   . Hypertension   . IBS (irritable bowel syndrome)    with diarrhea  . Lymphadenopathy   . Myalgia and myositis, unspecified   . PONV (postoperative nausea and vomiting)   . PTSD (post-traumatic stress disorder)   . Renal calculi   . Restless legs syndrome (RLS)   . Thoracic radiculopathy   . Vitamin D deficiency   . Wears glasses    There were no vitals taken for this visit.  Opioid Risk Score:11   Fall Risk Score:  `1  Depression screen PHQ 2/9  Depression screen [facility] 2/9 08/02/2017 07/05/2017 04/24/2017 01/17/2017 05/17/2016 05/04/2016 03/03/2016  Decreased Interest 1 3 2 2 2 2  0  Down, Depressed, Hopeless 1 3 2 2 2 2  0  PHQ - 2 Score 2 6 4 4 4 4  0  Some recent data might be hidden    Review of Systems  Constitutional: Positive for chills, diaphoresis, fever and unexpected weight change.  HENT: Negative.   Eyes: Negative.   Respiratory: Negative.   Cardiovascular: Positive for leg  swelling.  Gastrointestinal: Positive for abdominal pain, constipation, diarrhea, nausea and vomiting.  Endocrine: Negative.   Genitourinary: Negative.   Musculoskeletal: Positive for back pain.  Skin: Negative.   Allergic/Immunologic: Negative.   Neurological: Positive for dizziness, tremors, weakness and numbness.  Psychiatric/Behavioral: Positive for confusion and dysphoric mood. The patient is nervous/anxious.        Objective:   Physical Exam  Constitutional: She is oriented to person, place, and time. She appears well-developed and well-nourished.  HENT:  Head: Normocephalic and atraumatic.  Neck: Normal range of motion. Neck supple.  Cervical Paraspinal Tenderness: C-5-C-6  Cardiovascular: Normal rate and regular rhythm.   Pulmonary/Chest: Effort normal and breath sounds normal.  Musculoskeletal:  Normal Muscle Bulk and Muscle Testing Reveals: Upper Extremities: Full  ROM and Muscle Strength 4/5 Bilateral AC Joint Tenderness Thoracic Paraspinal Tenderness: T-7-T-9 Lumbar Paraspinal Tenderness: L-4-L-5 Lower Extremities: Right: Full ROM and Muscle Strength 5/5 Arises from Table Slowlyy using cane for support Narrow Based Gait   Neurological: She is alert and oriented to person, place, and time.  Skin: Skin is warm and dry.  Psychiatric: She has a normal mood and affect.  Nursing note and vitals reviewed.         Assessment & Plan:  1. History of fibromyalgia with myofascial pain and multiple trigger points. Continue with Heat and exercise Regime. Continue with gabapentin. 08/02/2017 2. Chronic migraine headaches.Continue to Monitor. S/P Botox.on 05/30/2017  08/02/2017 3. Midline Low Back Pain/ Lumbar Spondylosis/Lumbar degenerative disk disease, L4-5 : Refilled: Fentanyl Patch 25 mcg one patch every three days #10 andHydrocodone 5/325 mg one tablet every 6 hours as needed #90. 08/02/2017 4. 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  Urology Following. 08/02/2017 5. 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 08/02/2017 6. Cervicalgia/ Cervical RadiculitisCervical Spondylosis: Continue Gabapentin. Continue  with HEP and Continue to monitor. 08/02/2017  20 minutes of face to face patient care time was spent during this visit. All questions were encouraged and answered.   F/U in 1 month

## 2017-08-08 DIAGNOSIS — R1084 Generalized abdominal pain: Secondary | ICD-10-CM | POA: Diagnosis not present

## 2017-08-08 DIAGNOSIS — R3914 Feeling of incomplete bladder emptying: Secondary | ICD-10-CM | POA: Diagnosis not present

## 2017-08-08 DIAGNOSIS — R109 Unspecified abdominal pain: Secondary | ICD-10-CM | POA: Diagnosis not present

## 2017-08-08 DIAGNOSIS — Z87442 Personal history of urinary calculi: Secondary | ICD-10-CM | POA: Diagnosis not present

## 2017-08-08 LAB — DRUG TOX MONITOR 1 W/CONF, ORAL FLD
Amphetamines: NEGATIVE ng/mL (ref <10)
BUPRENORPHINE: NEGATIVE ng/mL (ref <0.10)
Barbiturates: NEGATIVE ng/mL (ref <10)
Benzodiazepines: NEGATIVE ng/mL (ref <0.50)
CODEINE: NEGATIVE ng/mL (ref <2.5)
Cocaine: NEGATIVE ng/mL (ref <5.0)
DIHYDROCODEINE: NEGATIVE ng/mL (ref <2.5)
FENTANYL: 0.38 ng/mL — AB (ref <0.10)
Fentanyl: POSITIVE ng/mL — AB (ref <0.10)
HEROIN METABOLITE: NEGATIVE ng/mL (ref <1.0)
Hydrocodone: 8 ng/mL — ABNORMAL HIGH (ref <2.5)
Hydromorphone: NEGATIVE ng/mL (ref <2.5)
MARIJUANA: NEGATIVE ng/mL (ref <2.5)
MDMA: NEGATIVE ng/mL (ref <10)
MEPROBAMATE: NEGATIVE ng/mL (ref <2.5)
METHADONE: NEGATIVE ng/mL (ref <5.0)
MORPHINE: NEGATIVE ng/mL (ref <2.5)
NICOTINE METABOLITE: NEGATIVE ng/mL (ref <5.0)
NOROXYCODONE: NEGATIVE ng/mL (ref <2.5)
Norhydrocodone: NEGATIVE ng/mL (ref <2.5)
OPIATES: POSITIVE ng/mL — AB (ref <2.5)
OXYCODONE: NEGATIVE ng/mL (ref <2.5)
OXYMORPHONE: NEGATIVE ng/mL (ref <2.5)
PHENCYCLIDINE: NEGATIVE ng/mL (ref <10)
TAPENTADOL: NEGATIVE ng/mL (ref <5.0)
TRAMADOL: NEGATIVE ng/mL (ref <5.0)
Zolpidem: NEGATIVE ng/mL (ref <5.0)

## 2017-08-08 LAB — DRUG TOX ALC METAB W/CON, ORAL FLD: ALCOHOL METABOLITE: NEGATIVE ng/mL (ref <25)

## 2017-08-10 ENCOUNTER — Ambulatory Visit: Payer: 59 | Admitting: Family Medicine

## 2017-08-10 ENCOUNTER — Encounter: Payer: Self-pay | Admitting: Family Medicine

## 2017-08-10 VITALS — BP 138/76 | HR 97 | Wt 141.0 lb

## 2017-08-10 DIAGNOSIS — Z87892 Personal history of anaphylaxis: Secondary | ICD-10-CM

## 2017-08-10 DIAGNOSIS — N951 Menopausal and female climacteric states: Secondary | ICD-10-CM

## 2017-08-10 DIAGNOSIS — B009 Herpesviral infection, unspecified: Secondary | ICD-10-CM | POA: Insufficient documentation

## 2017-08-10 DIAGNOSIS — G43519 Persistent migraine aura without cerebral infarction, intractable, without status migrainosus: Secondary | ICD-10-CM

## 2017-08-10 DIAGNOSIS — F3162 Bipolar disorder, current episode mixed, moderate: Secondary | ICD-10-CM | POA: Insufficient documentation

## 2017-08-10 DIAGNOSIS — M797 Fibromyalgia: Secondary | ICD-10-CM

## 2017-08-10 MED ORDER — EPINEPHRINE 0.3 MG/0.3ML IJ SOAJ: 0.3000 mg | Freq: Every day | INTRAMUSCULAR | 0 refills | Status: DC | PRN

## 2017-08-10 NOTE — Progress Notes (Signed)
New patient office visit note:  Impression and Recommendations:    1. Migraine aura, persistent, intractable   2. History of anaphylaxis   3. Postmenopausal syndrome   4. Bipolar 1 disorder, mixed, moderate (HCC)   5. Fibromyalgia/myofascial pain syndrome   6. Herpes simplex- oral lesions       Education and routine counseling performed. Handouts provided.  - Pt was in the office today for 60+ minutes, with over 50% time spent in face to face counseling of patients various medical conditions, treatment plans of those medical conditions including medicine management and lifestyle modification, strategies to improve health and well being; and in coordination of care. SEE ABOVE FOR DETAILS Gross side effects, risk and benefits, and alternatives of medications discussed with patient.  Patient is aware that all medications have potential side effects and we are unable to predict every side effect or drug-drug interaction that may occur.  Expresses verbal understanding and consents to current therapy plan and treatment regimen.  Return for f/up to discuss chronic issues further as your convenience .  Please see AVS handed out to patient at the end of our visit for further patient instructions/ counseling done pertaining to today's office visit.    Note: This document was prepared using Dragon voice recognition software and may include unintentional dictation errors.  ----------------------------------------------------------------------------------------------------------------------    Subjective:    Chief complaint:   Chief Complaint  Patient presents with  . Establish Care     HPI: Patty Salas is a pleasant 61 y.o. female who presents to [facility] today to review their medical history with me and establish care.   I asked the patient to review their chronic problem list with me to ensure everything was updated and accurate.    All  recent office visits with other providers, any medical records that patient brought in etc  - I reviewed today.     Also asked pt to get me medical records from [facility] providers/ specialists that they had seen within the past 3-5 years- if they are in private practice and/or do not work for a [employer], [facility], [facility], [facility] or [employer] owned practice.  Told them to call their specialists to clarify this if they are not sure.   Prior PCP was Dr. Kenton Kingfisher[facility].   Pt missed appts and was discharged from their practice.        Patient has a history of migraines and woke up this morning with 1.    Pt gets aura and facial palsy- which she did not get with this one.  Now pain is currently a 8 out of 10 on a scale, but looks extremely comfortable/ not in any discomfort.   She complains of sensitivity to the light but is not physically acting sensitive to it.  Patient is seeing Dr. Jannifer Franklin of neurology for treatment of her migraines she takes Almotriptan for abortive therapy-she is not able to take that more than twice per week and she is already met her max this week.  She is not sure what she can do for her headaches today.  Can't take phenergan as well- b/c she is driving.   - Has failed botox injections with Dr. Tessa Lerner of [facility] on 829 of 2018 for her migraines but it was minimally helpful.   - Pt has significant myofascial pains all over her body.  - not the worst ha of her life.   [facility]  [facility]  The psychiatrist Dr. Reece Levy treats her for her bipolar, Generalized anxiety disorder, posttraumatic stress disorder.  Denyse Amass- her counselor at the [facility]- this is where she gets her neurontin, valium from.   She has for refills of cyclobenzaprine for her fibromyalgia.  She has a large bottle who is completely filled with refills.  Told her she will need to get this from the [facility] doctor/doctor treating her chronic pain in the  future.   Problem  Herpes simplex- oral lesions  Bipolar 1 Disorder, Mixed, Moderate (Hcc)  History of Anaphylaxis   Several things she is allergic to including drugs etc. she needs to have EpiPen on hand.  Hers expired earlier this month.  Patient has not had to use it at all in the past couple years.  She uses Benadryl as needed.   Postmenopausal Syndrome      Wt Readings from Last 3 Encounters:  08/10/17 141 lb (64 kg)  09/05/16 139 lb (63 kg)  03/30/16 138 lb 1.6 oz (62.6 kg)   BP Readings from Last 3 Encounters:  08/10/17 138/76  08/02/17 137/80  07/05/17 132/79   Pulse Readings from Last 3 Encounters:  08/10/17 97  08/02/17 (!) 101  07/05/17 (!) 104   BMI Readings from Last 3 Encounters:  08/10/17 23.11 kg/m  09/05/16 22.78 kg/m  03/30/16 22.63 kg/m    Patient Care Team    Relationship Specialty Notifications Start End  Mellody Dance, DO PCP - General Family Medicine  08/10/17   Richmond Campbell, MD Consulting Physician Gastroenterology  11/11/14   Festus Aloe, MD Consulting Physician Urology  12/07/15   Alexis Frock, MD Consulting Physician Urology  12/07/15   Bayard Hugger, NP Nurse Practitioner Physical Medicine and Rehabilitation  08/10/17   Meredith Staggers, MD Consulting Physician Physical Medicine and Rehabilitation  08/10/17    Comment: Pain specialist.  Is where pt gets her narcotics from  Kathrynn Ducking, MD Consulting Physician Neurology  08/10/17     Patient Active Problem List   Diagnosis Date Noted  . Herpes simplex- oral lesions 08/10/2017    Priority: Medium  . Bipolar 1 disorder, mixed, moderate ([place]) 08/10/2017  . History of anaphylaxis 08/10/2017  . Postmenopausal syndrome 08/10/2017  . 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 History of kidney cancer 02/22/2016  . Renal mass 12/29/2015  . Left lateral epicondylitis 09/06/2015  . De Quervain's tenosynovitis, right 04/24/2014  . Enlarged  lymph node 02/12/2014  . BPPV (benign paroxysmal positional vertigo) 12/05/2013  . Cervical spondylosis without myelopathy 09/17/2013  . Post concussion syndrome 09/17/2013  . Nephrolithiasis 02/11/2013  . Carpal tunnel syndrome 01/01/2013  . Wrist tendonitis 01/10/2012  . Fibromyalgia/myofascial pain syndrome 12/06/2011  . Lumbar spondylosis 12/06/2011  . Lumbar degenerative disc disease 12/06/2011  . Depression with anxiety 12/06/2011  . Migraine aura, persistent, intractable 12/06/2011     Past Medical History:  Diagnosis Date  . Anxiety   . Arthritis   . Asthma   . Bipolar affective ([place])   . Calcifying tendinitis of shoulder   . Carpal tunnel syndrome   . Cervical facet syndrome   . Chronic pain syndrome   . Complication of anesthesia   . Depression   . Diverticulosis   . Falls   . Fibromyalgia   . 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 Full dentures   . Gout   .  Headache(784.0)    migraines  . Herpesviral infection   . Hypertension   . IBS (irritable bowel syndrome)    with diarrhea  . Lymphadenopathy   . Myalgia and myositis, unspecified   . PONV (postoperative nausea and vomiting)   . PTSD (post-traumatic stress disorder)   . Renal calculi   . Restless legs syndrome (RLS)   . Thoracic radiculopathy   . Vitamin D deficiency   . Wears glasses      Past Medical History:  Diagnosis Date  . Anxiety   . Arthritis   . Asthma   . Bipolar affective ([place])   . Calcifying tendinitis of shoulder   . Carpal tunnel syndrome   . Cervical facet syndrome   . Chronic pain syndrome   . Complication of anesthesia   . Depression   . Diverticulosis   . Falls   . Fibromyalgia   . 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 Full dentures   . Gout   . Headache(784.0)    migraines  . Herpesviral infection   . Hypertension   . IBS (irritable bowel syndrome)    with diarrhea  . Lymphadenopathy   .  Myalgia and myositis, unspecified   . PONV (postoperative nausea and vomiting)   . PTSD (post-traumatic stress disorder)   . Renal calculi   . Restless legs syndrome (RLS)   . Thoracic radiculopathy   . Vitamin D deficiency   . Wears glasses      Past Surgical History:  Procedure Laterality Date  .  kidney stones    . ABDOMINAL ADHESION SURGERY    . ABDOMINAL HYSTERECTOMY  1995   partial- hemmoraged after surgery-stitch came loose  . APPENDECTOMY  1993   hemmoraged after surgery- stitch came loose  . CERVICAL DISC SURGERY  06/04/2001   with fusion  . CHOLECYSTECTOMY  1985  . colonoscopy    . COLONOSCOPY    . CYSTOSCOPY    . FOOT SURGERY     lt  . jj stent  07/2002   with ureteroscopy, cystoscopy and then removal of stent in office  . LITHOTRIPSY    . NECK SURGERY  2002  . TONSILLECTOMY  1976     Family History  Problem Relation Age of Onset  . Cancer Mother   . Migraines Mother   . Heart disease Father   . Hypertension Father   . Cancer Father        colon  . Hypertension Brother   . Migraines Brother   . Hypertension Brother   . Migraines Brother   . Migraines Daughter      Social History   Substance and Sexual Activity  Drug Use No     Social History   Substance and Sexual Activity  Alcohol Use No     Social History   Tobacco Use  Smoking Status Never Smoker  Smokeless Tobacco Never Used     Outpatient Encounter Medications as of 08/10/2017  Medication Sig Note  . acetaminophen (TYLENOL) 500 MG tablet Take 500 mg by mouth every 6 (six) hours as needed for moderate pain.   Marland Kitchen acyclovir (ZOVIRAX) 800 MG tablet Take 800 mg by mouth 3 (three) times daily as needed (sores on mouth.).    Marland Kitchen almotriptan (AXERT) 12.5 MG tablet TAKE 1 TABLET (12.5 MG TOTAL) BY MOUTH AS NEEDED FOR MIGRAINE (MAX 2 TABS PER WEEK).   . cyclobenzaprine (FLEXERIL) 10 MG tablet Take 10 mg by  mouth 3 (three) times daily.   Marland Kitchen docusate sodium (COLACE) 100 MG capsule Take 100  mg by mouth daily as needed for mild constipation.    Marland Kitchen EPINEPHrine (EPIPEN 2-PAK) 0.3 mg/0.3 mL IJ SOAJ injection Inject 0.3 mLs (0.3 mg total) daily as needed into the skin (allergic reaction.). Reported on 12/06/2015   . estradiol (VIVELLE-DOT) 0.1 MG/24HR Place 1 patch onto the skin 2 (two) times a week. Wed and Saturday.   . fentaNYL (DURAGESIC - DOSED MCG/HR) 25 MCG/HR patch Place 1 patch (25 mcg total) onto the skin every 3 (three) days.   . furosemide (LASIX) 40 MG tablet Take 20 mg 2 (two) times daily by mouth.   . gabapentin (NEURONTIN) 300 MG capsule Take 600 mg by mouth at bedtime.   Marland Kitchen HYDROcodone-acetaminophen (NORCO/VICODIN) 5-325 MG tablet Take 1 tablet by mouth every 8 (eight) hours as needed for moderate pain.   . hyoscyamine (LEVSIN, ANASPAZ) 0.125 MG tablet Take 0.125 mg by mouth every 4 (four) hours as needed for bladder spasms or cramping.    . lidocaine (XYLOCAINE) 2 % solution Use as directed 20 mLs in the mouth or throat daily as needed for mouth pain.    Marland Kitchen LORazepam (ATIVAN) 1 MG tablet Take 1 mg by mouth 3 (three) times daily.   . potassium chloride SA (K-DUR,KLOR-CON) 20 MEQ tablet Take 20 mEq by mouth 2 (two) times daily. 12/28/2015: Has not had in 3 weeks.   Marland Kitchen PROAIR HFA 108 (90 BASE) MCG/ACT inhaler Inhale 2 puffs into the lungs every 4 (four) hours as needed for wheezing or shortness of breath.    . promethazine (PHENERGAN) 25 MG tablet Take 25 mg by mouth every 6 (six) hours as needed for nausea or vomiting.    . Trospium Chloride 60 MG CP24 Take 1 capsule by mouth every morning.    . Vitamin D, Ergocalciferol, (DRISDOL) 50000 UNITS CAPS capsule Take 50,000 Units by mouth every 7 (seven) days.  12/28/2015: Tuesdays. Has not done in three weeks.   . [DISCONTINUED] EPIPEN 2-PAK 0.3 MG/0.3ML SOAJ injection Inject 0.3 mg into the skin daily as needed (allergic reaction.). Reported on 12/06/2015   . bisacodyl (DULCOLAX) 5 MG EC tablet Take 10 mg by mouth daily as needed for mild  constipation.     No facility-administered encounter medications on file as of 08/10/2017.     Allergies: Cymbalta [duloxetine hcl]; Divalproex sodium; Duract [bromfenac]; Hydrochlorothiazide; Imitrex [sumatriptan succinate]; Latex; Mellaril; Olanzapine; Penicillins; Topamax; Aripiprazole; Aspirin; Dalmane [flurazepam hcl]; Darifenacin hydrobromide er; Metoclopramide hcl; Seroquel [quetiapine fumerate]; Statins; Stelazine; Thorazine [chlorpromazine hcl]; Abilify [aripiprazole]; Alka-seltzer [aspirin effervescent]; Allopurinol; Biofreeze [menthol (topical analgesic)]; Capzasin [capsaicin]; Colchicine; Cough drops [benzocaine]; Diflucan [fluconazole]; Iohexol; Ivp dye [iodinated diagnostic agents]; Lyrica [pregabalin]; Metamucil [psyllium]; Miralax [polyethylene glycol]; Myrbetriq [mirabegron]; Nsaids; Other; Potassium-containing compounds; Prednisone; Reglan [metoclopramide]; Renografin [diatrizoate]; Risperdal [risperidone]; Seroquel [quetiapine fumarate]; Simvastatin; Urocit - k [potassium citrate]; Zyprexa [olanzapine]; Avelox [moxifloxacin hcl in nacl]; Barium-containing compounds; Diclofenac; Doxycycline; E-mycin [erythromycin base]; Fiorinal [butalbital-aspirin-caffeine]; Flagyl [metronidazole hcl]; Lamictal [lamotrigine]; Pregabalin; Risperidone; and Toradol [ketorolac tromethamine]   ROS   Objective:   Blood pressure 138/76, pulse 97, weight 141 lb (64 kg). Body mass index is 23.11 kg/m. General: Well Developed, well nourished, and in no acute distress.  Neuro: Alert and oriented x3, extra-ocular muscles intact, sensation grossly intact.  HEENT:[place]/AT, PERRLA, neck supple, No carotid bruits Skin: no gross rashes  Cardiac: Regular rate and rhythm Respiratory: Essentially clear to auscultation bilaterally. Not using accessory muscles, speaking in  full sentences.  Abdominal: not grossly distended Musculoskeletal: Ambulates w/o diff, FROM * 4 ext.  Vasc: less 2 sec cap RF, warm and pink    Psych:  No HI/SI, judgement and insight good, Euthymic mood. Full Affect.    Recent Results (from the past 2160 hour(s))  Drug Tox Alc Metab w/Con, Oral Fld     Status: None   Collection Time: 08/02/17  1:05 PM  Result Value Ref Range   Alcohol Metabolite NEGATIVE <25 ng/mL    Comment: . For additional information, please refer to http://education.questdiagnostics.com/faq/FAQ186 (This link is being provided for informational/ educational purposes only.) . This drug testing is for medical treatment only. Analysis was performed as non-forensic testing and these results should be used only by healthcare providers to render diagnosis or treatment, or to monitor progress of medical conditions. . For assistance with interpreting these drug results, please contact a [employer] Toxicology Specialist: [phone] [place] [phone]), M-F, 8am-6pm EST. Marland Kitchen These tests were developed and their analytical performance characteristics have been determined by [facility]. They have not been cleared or approved by the FDA. These assays have been validated pursuant to the CLIA regulations and are used for clinical purposes.   Drug Tox Monitor 1 w/Conf, Oral Fld     Status: Abnormal   Collection Time: 08/02/17  1:05 PM  Result Value Ref Range   Amphetamines NEGATIVE <10 ng/mL   Barbiturates NEGATIVE <10 ng/mL   Benzodiazepines NEGATIVE <0.50 ng/mL   Buprenorphine NEGATIVE <0.10 ng/mL   Cocaine NEGATIVE <5.0 ng/mL   Fentanyl POSITIVE (A) <0.10 ng/mL   Fentanyl 0.38 (H) <0.10 ng/mL   Heroin Metabolite NEGATIVE <1.0 ng/mL   MARIJUANA NEGATIVE <2.5 ng/mL   MDMA NEGATIVE <10 ng/mL   Meprobamate NEGATIVE <2.5 ng/mL   Methadone NEGATIVE <5.0 ng/mL   Nicotine Metabolite NEGATIVE <5.0 ng/mL   Opiates POSITIVE (A) <2.5 ng/mL   Codeine Negative <2.5 ng/mL   Dihydrocodeine Negative <2.5 ng/mL   Hydrocodone 8.0 (H) <2.5 ng/mL    Comment: . Hydrocodone is a minor metabolite of  codeine as well as prescribed drug. Hydrocodone is a pharmaceutical impurity of oxycodone (up to 1%). Marland Kitchen    Hydromorphone Negative <2.5 ng/mL   Morphine Negative <2.5 ng/mL   Norhydrocodone Negative <2.5 ng/mL   Noroxycodone Negative <2.5 ng/mL   Oxycodone Negative <2.5 ng/mL   Oxymorphone Negative <2.5 ng/mL   Phencyclidine NEGATIVE <10 ng/mL   Tapentadol NEGATIVE <5.0 ng/mL   Tramadol NEGATIVE <5.0 ng/mL   Zolpidem NEGATIVE <5.0 ng/mL    Comment: . For additional information, please refer to http://education.questdiagnostics.com/faq/FAQ186 (This link is being provided for informational/ educational purposes only.) . This drug testing is for medical treatment only. Analysis was performed as non-forensic testing and these results should be used only by healthcare providers to render diagnosis or treatment, or to monitor progress of medical conditions. . For assistance with interpreting these drug results, please contact a [employer] Toxicology Specialist: [phone] [place] [phone]), M-F, 8am-6pm EST. Marland Kitchen These tests were developed and their analytical performance characteristics have been determined by [facility]. They have not been cleared or approved by the FDA. These assays have been validated pursuant to the CLIA regulations and are used for clinical purposes.

## 2017-08-10 NOTE — Patient Instructions (Addendum)
Please use heating pad for your migraines HA's as well as cold compresses to face.  Please f/up with your migraine specialist for definitive treatment   Make sure you complete all the paperwork we handed to you today as it is very important we know exactly who is on our care team and all other specialist you are seeing that treat you for various conditions.  Please bring in this paperwork over the next week completed so we can input this into your chart!  Please make sure you sign paperwork for release of all your medical records from any : Doctors or any specialist.  -Patient will obtain a GYN for her Vivelle patches\hormone replacement therapy.  She had seen one in the past but has not been in a couple of years.  She will call for appointment.  She understands I do not do hormone replacement therapy  --> Please take one half a tablet of your 40 mg Lasix tab twice daily for 4-6 weeks.  She has well over 40 pills or so in her bottle.   Then we will bring you back and further discuss chronic issues.  We will also check lab work done as well then  She was told by her prior PCP will need to follow-up with cardiology for continued treatment of her lower extremity edema.  Patient will call for appointment with Cards.  Has not been seen in many years.       Migraine Headache A migraine headache is an intense, throbbing pain on one side or both sides of the head. Migraines may also cause other symptoms, such as nausea, vomiting, and sensitivity to light and noise. What are the causes? Doing or taking certain things may also trigger migraines, such as:  Alcohol.  Smoking.  Medicines, such as: ? Medicine used to treat chest pain (nitroglycerine). ? Birth control pills. ? Estrogen pills. ? Certain blood pressure medicines.  Aged cheeses, chocolate, or caffeine.  Foods or drinks that contain nitrates, glutamate, aspartame, or tyramine.  Physical activity.  Other things that may trigger a  migraine include:  Menstruation.  Pregnancy.  Hunger.  Stress, lack of sleep, too much sleep, or fatigue.  Weather changes.  What increases the risk? The following factors may make you more likely to experience migraine headaches:  Age. Risk increases with age.  Family history of migraine headaches.  Being Caucasian.  Depression and anxiety.  Obesity.  Being a woman.  Having a hole in the heart (patent foramen ovale) or other heart problems.  What are the signs or symptoms? The main symptom of this condition is pulsating or throbbing pain. Pain may:  Happen in any area of the head, such as on one side or both sides.  Interfere with daily activities.  Get worse with physical activity.  Get worse with exposure to bright lights or loud noises.  Other symptoms may include:  Nausea.  Vomiting.  Dizziness.  General sensitivity to bright lights, loud noises, or smells.  Before you get a migraine, you may get warning signs that a migraine is developing (aura). An aura may include:  Seeing flashing lights or having blind spots.  Seeing bright spots, halos, or zigzag lines.  Having tunnel vision or blurred vision.  Having numbness or a tingling feeling.  Having trouble talking.  Having muscle weakness.  How is this diagnosed? A migraine headache can be diagnosed based on:  Your symptoms.  A physical exam.  Tests, such as CT scan or MRI of  the head. These imaging tests can help rule out other causes of headaches.  Taking fluid from the spine (lumbar puncture) and analyzing it (cerebrospinal fluid analysis, or CSF analysis).  How is this treated? A migraine headache is usually treated with medicines that:  Relieve pain.  Relieve nausea.  Prevent migraines from coming back.  Treatment may also include:  Acupuncture.  Lifestyle changes like avoiding foods that trigger migraines.  Follow these instructions at home: Medicines  Take  over-the-counter and prescription medicines only as told by your health care provider.  Do not drive or use heavy machinery while taking prescription pain medicine.  To prevent or treat constipation while you are taking prescription pain medicine, your health care provider may recommend that you: ? Drink enough fluid to keep your urine clear or pale yellow. ? Take over-the-counter or prescription medicines. ? Eat foods that are high in fiber, such as fresh fruits and vegetables, whole grains, and beans. ? Limit foods that are high in fat and processed sugars, such as fried and sweet foods. Lifestyle  Avoid alcohol use.  Do not use any products that contain nicotine or tobacco, such as cigarettes and e-cigarettes. If you need help quitting, ask your health care provider.  Get at least 8 hours of sleep every night.  Limit your stress. General instructions   Keep a journal to find out what may trigger your migraine headaches. For example, write down: ? What you eat and drink. ? How much sleep you get. ? Any change to your diet or medicines.  If you have a migraine: ? Avoid things that make your symptoms worse, such as bright lights. ? It may help to lie down in a dark, quiet room. ? Do not drive or use heavy machinery. ? Ask your health care provider what activities are safe for you while you are experiencing symptoms.  Keep all follow-up visits as told by your health care provider. This is important. Contact a health care provider if:  You develop symptoms that are different or more severe than your usual migraine symptoms. Get help right away if:  Your migraine becomes severe.  You have a fever.  You have a stiff neck.  You have vision loss.  Your muscles feel weak or like you cannot control them.  You start to lose your balance often.  You develop trouble walking.  You faint. This information is not intended to replace advice given to you by your health care  provider. Make sure you discuss any questions you have with your health care provider. Document Released: 09/18/2005 Document Revised: 04/07/2016 Document Reviewed: 03/06/2016 Elsevier Interactive Patient Education  2017 [employer].

## 2017-08-13 ENCOUNTER — Telehealth: Payer: Self-pay | Admitting: *Deleted

## 2017-08-13 NOTE — Telephone Encounter (Signed)
Oral swab drug screen was consistent for prescribed medications.  ?

## 2017-08-22 ENCOUNTER — Telehealth: Payer: Self-pay

## 2017-08-22 ENCOUNTER — Other Ambulatory Visit: Payer: Self-pay

## 2017-08-22 ENCOUNTER — Telehealth: Payer: Self-pay | Admitting: Family Medicine

## 2017-08-22 MED ORDER — CYCLOBENZAPRINE HCL 10 MG PO TABS: 10.0000 mg | ORAL_TABLET | Freq: Three times a day (TID) | ORAL | 4 refills | Status: DC

## 2017-08-22 NOTE — Telephone Encounter (Signed)
Prescription filled.

## 2017-08-22 NOTE — Telephone Encounter (Signed)
Patty Salas notified.

## 2017-08-22 NOTE — Telephone Encounter (Signed)
Patient called stating that she has an appointment with her new primary care physician on the 9th (09/09/17) and that the only medication the Dr. Is refilling is the epi pen and that is all. She is wondering if Dr. Naaman Plummer can refill her flexeril.

## 2017-08-22 NOTE — Telephone Encounter (Signed)
Noted and informed Dr. Opalski. MPulliam, CMA/RT(R)  

## 2017-08-22 NOTE — Telephone Encounter (Signed)
Patient wanted Dr. Raliegh Scarlet notified she is doing well & that pain mgt provider/ Dr. Alger Simons gv her Rx for 10 pills of Flexeril. --glh

## 2017-08-28 ENCOUNTER — Ambulatory Visit: Payer: 59 | Admitting: Family Medicine

## 2017-08-28 ENCOUNTER — Encounter: Payer: Self-pay | Admitting: Family Medicine

## 2017-08-28 VITALS — BP 128/70 | HR 102 | Ht 64.0 in | Wt 148.0 lb

## 2017-08-28 DIAGNOSIS — I10 Essential (primary) hypertension: Secondary | ICD-10-CM | POA: Diagnosis not present

## 2017-08-28 DIAGNOSIS — C8206 Follicular lymphoma grade I, intrapelvic lymph nodes: Secondary | ICD-10-CM

## 2017-08-28 DIAGNOSIS — M797 Fibromyalgia: Secondary | ICD-10-CM

## 2017-08-28 DIAGNOSIS — E559 Vitamin D deficiency, unspecified: Secondary | ICD-10-CM

## 2017-08-28 DIAGNOSIS — G43519 Persistent migraine aura without cerebral infarction, intractable, without status migrainosus: Secondary | ICD-10-CM

## 2017-08-28 DIAGNOSIS — Z85528 Personal history of other malignant neoplasm of kidney: Secondary | ICD-10-CM

## 2017-08-28 DIAGNOSIS — F3162 Bipolar disorder, current episode mixed, moderate: Secondary | ICD-10-CM

## 2017-08-28 DIAGNOSIS — N2889 Other specified disorders of kidney and ureter: Secondary | ICD-10-CM

## 2017-08-28 DIAGNOSIS — Z1239 Encounter for other screening for malignant neoplasm of breast: Secondary | ICD-10-CM

## 2017-08-28 DIAGNOSIS — Z1231 Encounter for screening mammogram for malignant neoplasm of breast: Secondary | ICD-10-CM

## 2017-08-28 NOTE — Progress Notes (Signed)
Impression and Recommendations:    1. Screening for breast cancer   2. Migraine aura, persistent, intractable   3. Bipolar 1 disorder, mixed, moderate ([place])   4. Fibromyalgia/myofascial pain syndrome   5. Vitamin D deficiency   6. Essential hypertension   7. 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 Renal mass   9. History of kidney cancer     Orders Placed This Encounter  Procedures  . MM DIGITAL SCREENING BILATERAL  . CBC with Differential/Platelet  . Comprehensive metabolic panel  . Hemoglobin A1c  . Lipid panel  . Phosphorus  . Magnesium  . VITAMIN D 25 Hydroxy (Vit-D Deficiency, Fractures)  . TSH  . T4, free  . Vitamin B12   Current Meds  Medication Sig  . acetaminophen (TYLENOL) 500 MG tablet Take 500 mg by mouth every 6 (six) hours as needed for moderate pain.  Marland Kitchen acyclovir (ZOVIRAX) 800 MG tablet Take 800 mg by mouth 3 (three) times daily as needed (sores on mouth.).   Marland Kitchen almotriptan (AXERT) 12.5 MG tablet TAKE 1 TABLET (12.5 MG TOTAL) BY MOUTH AS NEEDED FOR MIGRAINE (MAX 2 TABS PER WEEK).  . bisacodyl (DULCOLAX) 5 MG EC tablet Take 10 mg by mouth daily as needed for mild constipation.   . cyclobenzaprine (FLEXERIL) 10 MG tablet Take 1 tablet (10 mg total) by mouth 3 (three) times daily.  Marland Kitchen docusate sodium (COLACE) 100 MG capsule Take 100 mg by mouth daily as needed for mild constipation.   Marland Kitchen EPINEPHrine (EPIPEN 2-PAK) 0.3 mg/0.3 mL IJ SOAJ injection Inject 0.3 mLs (0.3 mg total) daily as needed into the skin (allergic reaction.). Reported on 12/06/2015  . estradiol (VIVELLE-DOT) 0.1 MG/24HR Place 1 patch onto the skin 2 (two) times a week. Wed and Saturday.  . fentaNYL (DURAGESIC - DOSED MCG/HR) 25 MCG/HR patch Place 1 patch (25 mcg total) onto the skin every 3 (three) days.  Marland Kitchen gabapentin (NEURONTIN) 300 MG capsule Take 600 mg by mouth at bedtime.  Marland Kitchen HYDROcodone-acetaminophen (NORCO/VICODIN) 5-325 MG tablet Take 1 tablet by mouth every 8  (eight) hours as needed for moderate pain.  . hyoscyamine (LEVSIN, ANASPAZ) 0.125 MG tablet Take 0.125 mg by mouth every 4 (four) hours as needed for bladder spasms or cramping.   . lidocaine (XYLOCAINE) 2 % solution Use as directed 20 mLs in the mouth or throat daily as needed for mouth pain.   Marland Kitchen LORazepam (ATIVAN) 1 MG tablet Take 1 mg by mouth 3 (three) times daily.  . potassium chloride SA (K-DUR,KLOR-CON) 20 MEQ tablet Take 20 mEq by mouth 2 (two) times daily.  Marland Kitchen PROAIR HFA 108 (90 BASE) MCG/ACT inhaler Inhale 2 puffs into the lungs every 4 (four) hours as needed for wheezing or shortness of breath.   . promethazine (PHENERGAN) 25 MG tablet Take 25 mg by mouth every 6 (six) hours as needed for nausea or vomiting.   . Trospium Chloride 60 MG CP24 Take 1 capsule by mouth every morning.   . Vitamin D, Ergocalciferol, (DRISDOL) 50000 UNITS CAPS capsule Take 50,000 Units by mouth every 7 (seven) days.   . [DISCONTINUED] furosemide (LASIX) 40 MG tablet Take 20 mg by mouth daily.      1. Screening for breast cancer   2. Migraine aura, persistent, intractable   3. Bipolar 1 disorder, mixed, moderate ([place])   4. Fibromyalgia/myofascial pain syndrome   5. Vitamin D deficiency   6. Essential hypertension  7. 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 Renal mass   9. History of kidney cancer      Plan   Gross side effects, risk and benefits, and alternatives of medications and treatment plan in general discussed with patient.  Patient is aware that all medications have potential side effects and we are unable to predict every side effect or drug-drug interaction that may occur.   Patient will call with any questions prior to using medication if they have concerns.  Expresses verbal understanding and consents to current therapy and treatment regimen.  No barriers to understanding were identified.  Red flag symptoms and signs discussed in detail.  Patient expressed  understanding regarding what to do in case of emergency\urgent symptoms  Please see AVS handed out to patient at the end of our visit for further patient instructions/ counseling done pertaining to today's office visit.   No Follow-up on file.    Note: This note was prepared with assistance of Dragon voice recognition software. Occasional wrong-word or sound-a-like substitutions may have occurred due to the inherent limitations of voice recognition software.  Eri Mcevers 11:12 AM --------------------------------------------------------------------------------------------------------------------------------------------------------------------------------------------------------------------------------------------    Subjective:    CC:  Chief Complaint  Patient presents with  . Follow-up    HPI: MCKAY BRANDT is a 61 y.o. female who presents to [place] [facility] today for issues as discussed below.  1)  - PT HAS BEEN ON THE VIVELLE PATCH SINCE 1999- SHE WOULD LIKE TO COME OFF IT.  Dr Ubaldo Glassing of GYN was her last GYn care.  Last seen 2012.  Dr Jeanell Sparrow and Tamala Julian retired- then Dr Ubaldo Glassing put her on it.   We discussed her to avoid use after 61yo due to increased risk CVA, MI, dementia etc.  2) - Pt worried about the swelling in legs.  She cut back on her lasix to one tab daily since last OV. No inc swelling in legs- no SOB.     Bp - not checking at home much but when she does- 118's/ 70's.  3)   HRT-   Pt WENT OFF vIVELLE - she took her patch off on 08/23/2017.  Did not put another one on.     Last office visit was spent a significant amount of time with patient.  On last office visit AVS she was told to  "Please use heating pad for your migraines HA's as well as cold compresses to face.  Please f/up with your migraine specialist for definitive treatment   Make sure you complete all the paperwork we handed to you today as it is very important we know exactly who is  on our care team and all other specialist you are seeing that treat you for various conditions.  Please bring in this paperwork over the next week completed so we can input this into your chart!  Please make sure you sign paperwork for release of all your medical records from any : Doctors or any specialist.  -Patient will obtain a GYN for her Vivelle patches\ hormone replacement therapy.  She had seen one in the past but has not been in a couple of years.  She will call for appointment.  She understands I do not do hormone replacement therapy  --> Please take one half a tablet of your 40 mg Lasix tab twice daily for 4-6 weeks.  She has well over 40 pills or so in her bottle.   Then  we will bring you back and further discuss chronic issues.  We will also check lab work done as well then  She was told by her prior PCP will need to follow-up with cardiology for continued treatment of her lower extremity edema.  Patient will call for appointment with Cards.  Has not been seen in many years".    Problem  Hypertension  Fibromyalgia/myofascial pain syndrome   Treated by [facility] Dr. Tessa Lerner   Bipolar 1 Disorder, Mixed, Moderate ([facility])   [facility]  The psychiatrist Dr. Reece Levy treats her for her bipolar, Generalized anxiety disorder, posttraumatic stress disorder.   Migraine Aura, Persistent, Intractable   08/10/17:  Patient has a history of migraines and woke up this morning with 1.    Pt gets aura and facial palsy- which she did not get with this one.  Now pain is currently a 8 out of 10 on a scale, but looks extremely comfortable/ not in any discomfort.   She complains of sensitivity to the light but is not physically acting sensitive to it.  Patient is seeing Dr. Jannifer Franklin of neurology for treatment of her migraines she takes Almotriptan for abortive therapy-she is not able to take that more than twice per week and she is already met her max this week.  She is  not sure what she can do for her headaches today.  Can't take phenergan as well- b/c she is driving.   - Has failed botox injections with Dr. Tessa Lerner of [facility] on 829 of 2018 for her migraines but it was minimally helpful.   - Pt has significant myofascial pains all over her body.  - not the worst ha of her life.    Vitamin D Deficiency  Herpes simplex- oral lesions  Follicular Lymphoma Grade I of Intrapelvic Lymph Nodes ([facility])   Patient follows with Dr. Jackquline Denmark of oncology for this.      Wt Readings from Last 3 Encounters:  08/28/17 148 lb (67.1 kg)  08/10/17 141 lb (64 kg)  09/05/16 139 lb (63 kg)   BP Readings from Last 3 Encounters:  08/28/17 128/70  08/10/17 138/76  08/02/17 137/80   Pulse Readings from Last 3 Encounters:  08/28/17 (!) 102  08/10/17 97  08/02/17 (!) 101   BMI Readings from Last 3 Encounters:  08/28/17 25.40 kg/m  08/10/17 23.11 kg/m  09/05/16 22.78 kg/m     Patient Care Team    Relationship Specialty Notifications Start End  Mellody Dance, DO PCP - General Family Medicine  08/10/17   Richmond Campbell, MD Consulting Physician Gastroenterology  11/11/14   Festus Aloe, MD Consulting Physician Urology  12/07/15   Alexis Frock, MD Consulting Physician Urology  12/07/15   Bayard Hugger, NP Nurse Practitioner Physical Medicine and Rehabilitation  08/10/17   Meredith Staggers, MD Consulting Physician Physical Medicine and Rehabilitation  08/10/17    Comment: Pain specialist.  Is where pt gets her narcotics from  Kathrynn Ducking, MD Consulting Physician Neurology  08/10/17   Wallene Huh, DPM Consulting Physician Podiatry  08/28/17   Ricard Dillon, MD  Psychiatry  08/28/17    Comment: Treats patient for her bipolar, generalized anxiety disorder etc.  Thea Gist  Physical Medicine and Rehabilitation  08/28/17   Sueanne Margarita, MD Consulting Physician Cardiology  08/28/17    Comment: per pt- she had been seen by her int he  past.  I do not see those OV notes  though.   Newt Minion, MD Consulting Physician Orthopedic Surgery  08/28/17   Calton Dach, MD Referring Physician Optometry  08/28/17   Heath Lark, MD Consulting Physician Hematology and Oncology  08/28/17      Patient Active Problem List   Diagnosis Date Noted  . Hypertension 08/28/2017    Priority: High  . Fibromyalgia/myofascial pain syndrome 12/06/2011    Priority: High  . Bipolar 1 disorder, mixed, moderate ([place]) 08/10/2017    Priority: Medium  . Migraine aura, persistent, intractable 12/06/2011    Priority: Medium  . Vitamin D deficiency 08/28/2017    Priority: Low  . Herpes simplex- oral lesions 08/10/2017    Priority: Low  . History of anaphylaxis 08/10/2017  . Postmenopausal syndrome 08/10/2017  . 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 History of kidney cancer 02/22/2016  . Renal mass 12/29/2015  . Left lateral epicondylitis 09/06/2015  . De Quervain's tenosynovitis, right 04/24/2014  . Enlarged lymph node 02/12/2014  . BPPV (benign paroxysmal positional vertigo) 12/05/2013  . Cervical spondylosis without myelopathy 09/17/2013  . Post concussion syndrome 09/17/2013  . Nephrolithiasis 02/11/2013  . Carpal tunnel syndrome 01/01/2013  . Wrist tendonitis 01/10/2012  . Lumbar spondylosis 12/06/2011  . Lumbar degenerative disc disease 12/06/2011  . Depression with anxiety 12/06/2011    Past Medical history, Surgical history, Family history, Social history, Allergies and Medications have been entered into the medical record, reviewed and changed as needed.    Current Meds  Medication Sig  . acetaminophen (TYLENOL) 500 MG tablet Take 500 mg by mouth every 6 (six) hours as needed for moderate pain.  Marland Kitchen acyclovir (ZOVIRAX) 800 MG tablet Take 800 mg by mouth 3 (three) times daily as needed (sores on mouth.).   Marland Kitchen almotriptan (AXERT) 12.5 MG tablet TAKE 1 TABLET (12.5 MG TOTAL) BY MOUTH AS  NEEDED FOR MIGRAINE (MAX 2 TABS PER WEEK).  . bisacodyl (DULCOLAX) 5 MG EC tablet Take 10 mg by mouth daily as needed for mild constipation.   . cyclobenzaprine (FLEXERIL) 10 MG tablet Take 1 tablet (10 mg total) by mouth 3 (three) times daily.  Marland Kitchen docusate sodium (COLACE) 100 MG capsule Take 100 mg by mouth daily as needed for mild constipation.   Marland Kitchen EPINEPHrine (EPIPEN 2-PAK) 0.3 mg/0.3 mL IJ SOAJ injection Inject 0.3 mLs (0.3 mg total) daily as needed into the skin (allergic reaction.). Reported on 12/06/2015  . estradiol (VIVELLE-DOT) 0.1 MG/24HR Place 1 patch onto the skin 2 (two) times a week. Wed and Saturday.  . fentaNYL (DURAGESIC - DOSED MCG/HR) 25 MCG/HR patch Place 1 patch (25 mcg total) onto the skin every 3 (three) days.  Marland Kitchen gabapentin (NEURONTIN) 300 MG capsule Take 600 mg by mouth at bedtime.  Marland Kitchen HYDROcodone-acetaminophen (NORCO/VICODIN) 5-325 MG tablet Take 1 tablet by mouth every 8 (eight) hours as needed for moderate pain.  . hyoscyamine (LEVSIN, ANASPAZ) 0.125 MG tablet Take 0.125 mg by mouth every 4 (four) hours as needed for bladder spasms or cramping.   . lidocaine (XYLOCAINE) 2 % solution Use as directed 20 mLs in the mouth or throat daily as needed for mouth pain.   Marland Kitchen LORazepam (ATIVAN) 1 MG tablet Take 1 mg by mouth 3 (three) times daily.  . potassium chloride SA (K-DUR,KLOR-CON) 20 MEQ tablet Take 20 mEq by mouth 2 (two) times daily.  Marland Kitchen PROAIR HFA 108 (90 BASE) MCG/ACT inhaler Inhale 2 puffs into the lungs every 4 (four) hours as needed for  wheezing or shortness of breath.   . promethazine (PHENERGAN) 25 MG tablet Take 25 mg by mouth every 6 (six) hours as needed for nausea or vomiting.   . Trospium Chloride 60 MG CP24 Take 1 capsule by mouth every morning.   . Vitamin D, Ergocalciferol, (DRISDOL) 50000 UNITS CAPS capsule Take 50,000 Units by mouth every 7 (seven) days.   . [DISCONTINUED] furosemide (LASIX) 40 MG tablet Take 20 mg by mouth daily.    Allergies:  Allergies    Allergen Reactions  . Cymbalta [Duloxetine Hcl] Anaphylaxis    Suicidal if in combination w/ Axert  . Divalproex Sodium Anaphylaxis  . Duract [Bromfenac] Anaphylaxis  . Hydrochlorothiazide Anaphylaxis    Pt loses facial movement uncontrolled;   . Imitrex [Sumatriptan Succinate] Anaphylaxis  . Latex Anaphylaxis  . Mellaril Anaphylaxis  . Olanzapine Anaphylaxis  . Penicillins Anaphylaxis  . Topamax Anaphylaxis and Other (See Comments)    Confusion, tremor, blurred vision  . Aripiprazole Other (See Comments)    Stiffened muscles  . Aspirin Hives and Itching  . Dalmane [Flurazepam Hcl] Other (See Comments)    Stiffened all muscles  . Darifenacin Hydrobromide Er Hives    Hives on back and looked like sunburn  . Metoclopramide Hcl Other (See Comments)    headache  . Seroquel [Quetiapine Fumerate] Other (See Comments)    extreme fatigue and bad dreams  . Statins Hives  . Stelazine Other (See Comments)    Stiffened muscles  . Thorazine [Chlorpromazine Hcl] Other (See Comments)    Stiffened muscles  . Abilify [Aripiprazole]     Stiffened muscles, extrapyramidal effects all over body.   . Alka-Seltzer [Aspirin Effervescent]     Mouth ulcers, swallowing problems.   . Allopurinol Hives  . Biofreeze [Menthol (Topical Analgesic)] Hives and Itching  . Capzasin [Capsaicin] Itching    Redness   . Colchicine Hives  . Cough Drops [Benzocaine]     orthicoat sprays containing menthol or lemon flavor--breaks inside of mouth out, red rash, mouth ulcers.   . Diflucan [Fluconazole] Hives and Itching  . Iohexol      Code: HIVES, Desc: pt broke out in red rash and hives after CT injection on 12/07/09.-pt needs 13 hr prep kit, Onset Date: [id number]   . Ivp Dye [Iodinated Diagnostic Agents]   . Lyrica [Pregabalin]   . Metamucil [Psyllium]     Mouth ulcers, swallowing problems.  Merril Abbe [Polyethylene Glycol] Nausea And Vomiting  . Myrbetriq [Mirabegron]     swelling  . Nsaids     Mouth  ulcers, swelling of mouth.   . Other Itching and Other (See Comments)    Dust, mold, feathers, wool, flannel, cologne, chlorox, household cleaning products, scented candles, cinnamon, ivory soap, paints, sun sentitive, acidic fruits, ( lemons, limes, strawbery, tartrazine yellow#5 and 6 in foods, MSG food additives, sudiium lauryl sulates, hot peppers, spearmint, wintergreen peppermint, mentol, orange, jalapenos. ---headache, breathing, welps, canker sores inside mouth, uticaria  . Potassium-Containing Compounds     Undiluted through IV. --Pain.   . Prednisone     No  Sleep at night.   . Reglan [Metoclopramide]     Headaches.   . Renografin [Diatrizoate]     welps   . Risperdal [Risperidone] Other (See Comments)    Too sedating.   Marland Kitchen Seroquel [Quetiapine Fumarate]     Bad dreams, too sedating.  . Simvastatin Other (See Comments)    Paranoid affect, muscle spasms.   Marland Kitchen Urocit - K [Potassium Citrate]   .  Zyprexa [Olanzapine]   . Avelox [Moxifloxacin Hcl In Nacl] Nausea And Vomiting  . Barium-Containing Compounds Rash  . Diclofenac Rash  . Doxycycline Nausea Only    Stomach upset.   Marland Kitchen E-Mycin [Erythromycin Base] Nausea Only  . Fiorinal [Butalbital-Aspirin-Caffeine] Itching and Rash    Welps,  . Flagyl [Metronidazole Hcl] Nausea And Vomiting  . Lamictal [Lamotrigine] Rash  . Pregabalin     Blurry vision, muscle stiffness   . Risperidone Other (See Comments)    Too sedating  . Toradol [Ketorolac Tromethamine] Itching and Rash     Review of Systems: General:   Denies fever, chills, unexplained weight loss.  Optho/Auditory:   Denies visual changes, blurred vision/LOV Respiratory:   Denies wheeze, DOE more than baseline levels.  Cardiovascular:   Denies chest pain, palpitations, new onset peripheral edema  Gastrointestinal:   Denies nausea, vomiting, diarrhea, abd pain.  Genitourinary: Denies dysuria, freq/ urgency, flank pain or discharge from genitals.  Endocrine:     Denies hot  or cold intolerance, polyuria, polydipsia. Musculoskeletal:   Denies unexplained myalgias, joint swelling, unexplained arthralgias, gait problems.  Skin:  Denies new onset rash, suspicious lesions Neurological:     Denies dizziness, unexplained weakness, numbness  Psychiatric/Behavioral:   Denies mood changes, suicidal or homicidal ideations, hallucinations    Objective:   Blood pressure 128/70, pulse (!) 102, height 5' 4"  (1.626 m), weight 148 lb (67.1 kg). Body mass index is 25.4 kg/m. General:  Well Developed, well nourished, appropriate for stated age.  Neuro:  Alert and oriented,  extra-ocular muscles intact  HEENT:  Normocephalic, atraumatic, neck supple, no carotid bruits appreciated  Skin:  no gross rash, warm, pink. Cardiac:  RRR, S1 S2 Respiratory:  ECTA B/L and A/P, Not using accessory muscles, speaking in full sentences- unlabored. Vascular:  Ext warm, no cyanosis apprec.; cap RF less 2 sec. Psych:  No HI/SI, judgement and insight good, Euthymic mood. Full Affect.

## 2017-08-28 NOTE — Patient Instructions (Addendum)
Go off Vivelle patches due to being on them for years and increased risk of CVA, MI, dementia etc. with the prolonged use after 60  -Stop lasix.   Please check your blood pressure at home after you are sitting and relaxed for 15-20 minutes and not in pain etc. check a couple times a day for 2-4 weeks and bring in your blood pressure log of what they have been running next office visit with me.

## 2017-08-29 LAB — TSH: TSH: 1.46 u[IU]/mL (ref 0.450–4.500)

## 2017-08-29 LAB — CBC WITH DIFFERENTIAL/PLATELET
BASOS ABS: 0.1 10*3/uL (ref 0.0–0.2)
Basos: 1 %
EOS (ABSOLUTE): 0.2 10*3/uL (ref 0.0–0.4)
Eos: 3 %
Hematocrit: 37.1 % (ref 34.0–46.6)
Hemoglobin: 12 g/dL (ref 11.1–15.9)
IMMATURE GRANS (ABS): 0 10*3/uL (ref 0.0–0.1)
Immature Granulocytes: 1 %
LYMPHS ABS: 1.4 10*3/uL (ref 0.7–3.1)
Lymphs: 23 %
MCH: 30.5 pg (ref 26.6–33.0)
MCHC: 32.3 g/dL (ref 31.5–35.7)
MCV: 94 fL (ref 79–97)
MONOS ABS: 0.5 10*3/uL (ref 0.1–0.9)
Monocytes: 8 %
Neutrophils Absolute: 4 10*3/uL (ref 1.4–7.0)
Neutrophils: 64 %
PLATELETS: 350 10*3/uL (ref 150–379)
RBC: 3.93 x10E6/uL (ref 3.77–5.28)
RDW: 14.1 % (ref 12.3–15.4)
WBC: 6.1 10*3/uL (ref 3.4–10.8)

## 2017-08-29 LAB — LIPID PANEL
CHOLESTEROL TOTAL: 250 mg/dL — AB (ref 100–199)
Chol/HDL Ratio: 3.7 ratio (ref 0.0–4.4)
HDL: 67 mg/dL (ref >39)
LDL Calculated: 159 mg/dL — ABNORMAL HIGH (ref 0–99)
TRIGLYCERIDES: 120 mg/dL (ref 0–149)
VLDL Cholesterol Cal: 24 mg/dL (ref 5–40)

## 2017-08-29 LAB — COMPREHENSIVE METABOLIC PANEL
A/G RATIO: 1.4 (ref 1.2–2.2)
ALK PHOS: 89 IU/L (ref 39–117)
ALT: 15 IU/L (ref 0–32)
AST: 29 IU/L (ref 0–40)
Albumin: 4.8 g/dL (ref 3.6–4.8)
BILIRUBIN TOTAL: 0.4 mg/dL (ref 0.0–1.2)
BUN/Creatinine Ratio: 15 (ref 12–28)
BUN: 13 mg/dL (ref 8–27)
CHLORIDE: 98 mmol/L (ref 96–106)
CO2: 29 mmol/L (ref 20–29)
Calcium: 10.1 mg/dL (ref 8.7–10.3)
Creatinine, Ser: 0.88 mg/dL (ref 0.57–1.00)
GFR calc non Af Amer: 71 mL/min/{1.73_m2} (ref >59)
GFR, EST AFRICAN AMERICAN: 81 mL/min/{1.73_m2} (ref >59)
GLUCOSE: 102 mg/dL — AB (ref 65–99)
Globulin, Total: 3.4 g/dL (ref 1.5–4.5)
POTASSIUM: 4.2 mmol/L (ref 3.5–5.2)
Sodium: 143 mmol/L (ref 134–144)
TOTAL PROTEIN: 8.2 g/dL (ref 6.0–8.5)

## 2017-08-29 LAB — VITAMIN D 25 HYDROXY (VIT D DEFICIENCY, FRACTURES): VIT D 25 HYDROXY: 23.1 ng/mL — AB (ref 30.0–100.0)

## 2017-08-29 LAB — PHOSPHORUS: Phosphorus: 3.1 mg/dL (ref 2.5–4.5)

## 2017-08-29 LAB — HEMOGLOBIN A1C
ESTIMATED AVERAGE GLUCOSE: 117 mg/dL
HEMOGLOBIN A1C: 5.7 % — AB (ref 4.8–5.6)

## 2017-08-29 LAB — T4, FREE: Free T4: 1.01 ng/dL (ref 0.82–1.77)

## 2017-08-29 LAB — MAGNESIUM: MAGNESIUM: 2.1 mg/dL (ref 1.6–2.3)

## 2017-08-29 LAB — VITAMIN B12: VITAMIN B 12: 299 pg/mL (ref 232–1245)

## 2017-09-03 ENCOUNTER — Ambulatory Visit: Payer: 59 | Admitting: Registered Nurse

## 2017-09-03 ENCOUNTER — Telehealth: Payer: Self-pay | Admitting: Neurology

## 2017-09-03 NOTE — Telephone Encounter (Signed)
Pt called she is sick and cannot keep appt with Dr Jannifer Franklin on 09/06/17. Pt said she has fallen a couple of times on 11/22 and is seeing her PCP/Deborah Opalski tomorrow and then pain management on Wednesday. Pt said she saw Hoyle Sauer when Dr Erling Cruz was at the clinic, an appt has been scheduled 12/19.

## 2017-09-03 NOTE — Telephone Encounter (Signed)
error 

## 2017-09-04 ENCOUNTER — Ambulatory Visit: Payer: 59

## 2017-09-04 ENCOUNTER — Ambulatory Visit (INDEPENDENT_AMBULATORY_CARE_PROVIDER_SITE_OTHER): Payer: 59 | Admitting: Family Medicine

## 2017-09-04 ENCOUNTER — Encounter: Payer: Self-pay | Admitting: Family Medicine

## 2017-09-04 VITALS — BP 127/86 | HR 96 | Resp 98 | Ht 64.0 in | Wt 145.0 lb

## 2017-09-04 DIAGNOSIS — M419 Scoliosis, unspecified: Secondary | ICD-10-CM

## 2017-09-04 DIAGNOSIS — Z9181 History of falling: Secondary | ICD-10-CM | POA: Diagnosis not present

## 2017-09-04 DIAGNOSIS — M546 Pain in thoracic spine: Secondary | ICD-10-CM

## 2017-09-04 NOTE — Telephone Encounter (Signed)
Noted  
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
## 2017-09-05 ENCOUNTER — Telehealth: Payer: Self-pay | Admitting: *Deleted

## 2017-09-05 ENCOUNTER — Encounter: Payer: 59 | Attending: Physical Medicine and Rehabilitation | Admitting: Registered Nurse

## 2017-09-05 ENCOUNTER — Encounter: Payer: Self-pay | Admitting: Registered Nurse

## 2017-09-05 VITALS — BP 132/67 | HR 102 | Resp 14

## 2017-09-05 DIAGNOSIS — M47812 Spondylosis without myelopathy or radiculopathy, cervical region: Secondary | ICD-10-CM

## 2017-09-05 DIAGNOSIS — M4716 Other spondylosis with myelopathy, lumbar region: Secondary | ICD-10-CM | POA: Diagnosis present

## 2017-09-05 DIAGNOSIS — M5136 Other intervertebral disc degeneration, lumbar region: Secondary | ICD-10-CM | POA: Diagnosis present

## 2017-09-05 DIAGNOSIS — Z79899 Other long term (current) drug therapy: Secondary | ICD-10-CM | POA: Diagnosis not present

## 2017-09-05 DIAGNOSIS — G894 Chronic pain syndrome: Secondary | ICD-10-CM | POA: Insufficient documentation

## 2017-09-05 DIAGNOSIS — G43519 Persistent migraine aura without cerebral infarction, intractable, without status migrainosus: Secondary | ICD-10-CM | POA: Insufficient documentation

## 2017-09-05 DIAGNOSIS — M797 Fibromyalgia: Secondary | ICD-10-CM | POA: Insufficient documentation

## 2017-09-05 DIAGNOSIS — M542 Cervicalgia: Secondary | ICD-10-CM | POA: Diagnosis not present

## 2017-09-05 DIAGNOSIS — M5412 Radiculopathy, cervical region: Secondary | ICD-10-CM

## 2017-09-05 DIAGNOSIS — Z5181 Encounter for therapeutic drug level monitoring: Secondary | ICD-10-CM

## 2017-09-05 DIAGNOSIS — G8929 Other chronic pain: Secondary | ICD-10-CM

## 2017-09-05 DIAGNOSIS — M546 Pain in thoracic spine: Secondary | ICD-10-CM | POA: Diagnosis not present

## 2017-09-05 DIAGNOSIS — F0781 Postconcussional syndrome: Secondary | ICD-10-CM | POA: Diagnosis present

## 2017-09-05 MED ORDER — HYDROCODONE-ACETAMINOPHEN 5-325 MG PO TABS: 1.0000 | ORAL_TABLET | Freq: Three times a day (TID) | ORAL | 0 refills | Status: DC | PRN

## 2017-09-05 MED ORDER — FENTANYL 25 MCG/HR TD PT72: 25.0000 ug | MEDICATED_PATCH | TRANSDERMAL | 0 refills | Status: DC

## 2017-09-05 NOTE — Progress Notes (Signed)
Subjective:    Patient ID: Patty Salas, female    DOB: 1956-03-13, 61 y.o.   MRN: [id number]  HPI: Patty Salas is a 61 year old female who returns for follow up appointmentfor chronic pain and medication refill. She states her pain is located in her neck, mid-lower back and left knee pain.  She rated her pain 15+. Her current exercise regime is performing stretching exercises with bands and walking.  Patty Salas states she had two falls one on 08/23/2017, reports she was locking her front door and tripped over a throw rug. Educated to remove all throw rugs and runners. She verbalizes understanding.  Patty Salas reports she had two falls on 08/31/2017, she was in the kitchen and tripped over runners, and when she was in the bathroom her toe became tangled up in the rug. She was instructed to remove all rugs and runners. She verbalizes understanding. Also states her husband keeps putting the runners and rugs down after she picks them up. This provider asked if she could speak to her husband again, Patty Salas doesn't want provider to speak to her husband. Patty Salas understanding.   Patty Salas Morphine equivalent is  75 MME.  She is also prescribed lorazepam by Dr. Reece Levy. We have reviewed the black box warning regarding using opioids and benzodiazepines. I highlighted the dangers of using these drugs together and discussed the adverse events including respiratory suppression, overdose, cognitive impairment and importance of  compliance with current regimen. she verbalizes understanding, we will continue to monitor and adjust as indicated.  She is being closely monitored and under the care of her psychiatrist Dr. Reece Levy.  Oral Swab  Was performed on 08/02/2017 was consistent.   Pain Inventory Average Pain 10 Pain Right Now 15+ My pain is constant, sharp, burning and stabbing  In the last 24 hours, has pain interfered with the following? General activity 10+ Relation with others  10+ Enjoyment of life 10+ What TIME of day is your pain at its worst? all times Sleep (in general) Fair  Pain is worse with: walking, bending, sitting, inactivity, standing and some activites Pain improves with: rest, heat/ice, therapy/exercise, medication and injections Relief from Meds: 4  Mobility walk without assistance walk with assistance use a cane use a walker how many minutes can you walk? 5-10 ability to climb steps?  yes do you drive?  yes needs help with transfers transfers alone Do you have any goals in this area?  yes  Function I need assistance with the following:  meal prep, household duties and shopping Do you have any goals in this area?  yes  Neuro/Psych bladder control problems bowel control problems weakness numbness tremor tingling trouble walking spasms dizziness confusion depression anxiety  Prior Studies Any changes since last visit?  no  Physicians involved in your care Any changes since last visit?  no   Family History  Problem Relation Age of Onset  . Cancer Mother        stomach  . Migraines Mother   . Arthritis Mother   . Emphysema Mother   . Heart disease Father   . Hypertension Father   . Cancer Father        colon  . Dementia Father   . Hypertension Brother   . Migraines Brother   . Hypertension Brother   . Migraines Brother   . Alcohol abuse Brother   . Bipolar disorder Brother   . Migraines Daughter   .  Arthritis Maternal Grandmother   . Cancer Maternal Grandmother        stomach  . Diabetes Maternal Grandmother   . Stroke Maternal Grandmother   . Cancer Maternal Aunt        lung  . Emphysema Maternal Aunt   . Cancer Paternal Uncle        colon  . Hypertension Maternal Aunt   . Heart disease Maternal Aunt   . Cancer Paternal Uncle        lung   Social History   Socioeconomic History  . Marital status: Married    Spouse name: Not on file  . Number of children: 1  . Years of education: COLLEGE1  .  Highest education level: Not on file  Social Needs  . Financial resource strain: Not on file  . Food insecurity - worry: Not on file  . Food insecurity - inability: Not on file  . Transportation needs - medical: Not on file  . Transportation needs - non-medical: Not on file  Occupational History  . Occupation: HOUSEWIFE    Employer: UNEMPLOYED  Tobacco Use  . Smoking status: Never Smoker  . Smokeless tobacco: Never Used  Substance and Sexual Activity  . Alcohol use: No  . Drug use: No  . Sexual activity: Not on file  Other Topics Concern  . Not on file  Social History Narrative   Patient is right handed.   Patient drinks caffeine occasionally.   Past Surgical History:  Procedure Laterality Date  .  kidney stones    . ABDOMINAL ADHESION SURGERY    . ABDOMINAL HYSTERECTOMY  1995   partial- hemmoraged after surgery-stitch came loose  . APPENDECTOMY  1993   hemmoraged after surgery- stitch came loose  . CERVICAL DISC SURGERY  06/04/2001   with fusion  . CHOLECYSTECTOMY  1985  . colonoscopy    . COLONOSCOPY    . CYSTOSCOPY    . FOOT SURGERY     lt  . jj stent  07/2002   with ureteroscopy, cystoscopy and then removal of stent in office  . LITHOTRIPSY    . LYMPH NODE BIOPSY Right 03/06/2014   Procedure: right groin LYMPH NODE BIOPSY;  Surgeon: Merrie Roof, MD;  Location: [facility];  Service: General;  Laterality: Right;  . LYMPHADENECTOMY N/A 12/29/2015   Procedure: RETROPERITONEAL LYMPHADENECTOMY;  Surgeon: Alexis Frock, MD;  Location: [facility];  Service: Urology;  Laterality: N/A;  . NECK SURGERY  2002  . ROBOT ASSISTED LAPAROSCOPIC NEPHRECTOMY Left 12/29/2015   Procedure: XI ROBOTIC ASSISTED LEFT LAPAROSCOPIC NEPHRECTOMY;  Surgeon: Alexis Frock, MD;  Location: [facility];  Service: Urology;  Laterality: Left;  . TONSILLECTOMY  1976   Past Medical History:  Diagnosis Date  . Anxiety   . Arthritis   . Asthma   . Bipolar affective ([place])   . Calcifying  tendinitis of shoulder   . Carpal tunnel syndrome   . Cervical facet syndrome   . Chronic pain syndrome   . Complication of anesthesia   . Depression   . Diverticulosis   . Falls   . Fibromyalgia   . 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 Full dentures   . Gout   . Headache(784.0)    migraines  . Herpesviral infection   . Hypertension   . IBS (irritable bowel syndrome)    with diarrhea  . Lymphadenopathy   . Myalgia and myositis, unspecified   .  PONV (postoperative nausea and vomiting)   . PTSD (post-traumatic stress disorder)   . Renal calculi   . Restless legs syndrome (RLS)   . Thoracic radiculopathy   . Vitamin D deficiency   . Wears glasses    There were no vitals taken for this visit.  Opioid Risk Score:11   Fall Risk Score:  `1  Depression screen PHQ 2/9  Depression screen [facility] 2/9 08/02/2017 07/05/2017 04/24/2017 01/17/2017 05/17/2016 05/04/2016 03/03/2016  Decreased Interest 1 3 2 2 2 2  0  Down, Depressed, Hopeless 1 3 2 2 2 2  0  PHQ - 2 Score 2 6 4 4 4 4  0  Some recent data might be hidden    Review of Systems  Constitutional: Positive for chills, diaphoresis, fever and unexpected weight change.  HENT: Negative.   Eyes: Negative.   Respiratory: Negative.   Gastrointestinal: Positive for abdominal pain, diarrhea, nausea and vomiting.  Endocrine: Negative.   Genitourinary: Negative.   Musculoskeletal: Positive for arthralgias, back pain, gait problem, myalgias and neck pain.       Spasms  Skin: Positive for rash.  Allergic/Immunologic: Negative.   Neurological: Positive for dizziness, tremors, weakness and numbness.       Tingling  Psychiatric/Behavioral: Positive for confusion and dysphoric mood. The patient is nervous/anxious.        Objective:   Physical Exam  Constitutional: She is oriented to person, place, and time. She appears well-developed and well-nourished.  HENT:  Head: Normocephalic and atraumatic.  Neck:  Normal range of motion. Neck supple.  Cervical Paraspinal Tenderness: C-5-C-6  Cardiovascular: Normal rate and regular rhythm.  Pulmonary/Chest: Effort normal and breath sounds normal.  Musculoskeletal:  Normal Muscle Bulk and Muscle Testing Reveals: Upper Extremities: Full  ROM and Muscle Strength 4/5 Bilateral AC Joint Tenderness Thoracic and Lumbar Hypersensitivity Resolving Ecchymosis Noted ( Thoracic T-7-T-9) Lower Extremities: Right: Full ROM and Muscle Strength 5/5 Left Knee Abrasion Noted Arises from Table Slowlyy using cane for support Narrow Based Gait   Neurological: She is alert and oriented to person, place, and time.  Skin: Skin is warm and dry.  Psychiatric: She has a normal mood and affect.  Nursing note and vitals reviewed.         Assessment & Plan:  1. History of fibromyalgia with myofascial pain and multiple trigger points. Continue with Heat and exercise Regime. Continue with gabapentin. 09/05/2017 2. Chronic migraine headaches.Continue to Monitor. S/P Botox.on 05/30/2017  09/05/2017 3. Midline Low Back Pain/ Lumbar Spondylosis/Lumbar degenerative disk disease, L4-5 : Refilled: Fentanyl Patch 25 mcg one patch every three days #10 andHydrocodone 5/325 mg one tablet every 6 hours as needed #90. 09/05/2017 4. 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 Urology Following. 09/05/2017 5. 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 09/05/2017 6. Cervicalgia/ Cervical RadiculitisCervical Spondylosis: Continue Gabapentin. Continue  with HEP and Continue to monitor. 09/05/2017  20 minutes of face to face patient care time was spent during this visit. All questions were encouraged and answered.   F/U in 1 month

## 2017-09-05 NOTE — Telephone Encounter (Signed)
Spoke to family member and he will give message to call me back.  Calling to make sure appt is about migraines and not a new problem.

## 2017-09-06 ENCOUNTER — Ambulatory Visit: Payer: 59 | Admitting: Neurology

## 2017-09-06 NOTE — Telephone Encounter (Signed)
Spoke to pt and she will be seeing [place] for migraines.  She has had falls (rugs, which she knows not have have down).  She wanted to relay is doing exercises for BPPV.  See's PM, Dr. Mellody Dance as pcp now,  [facility], and Dr. Pollyann Savoy.  Dr. Raliegh Scarlet has done recent labs if needs results.  09-19-17 at 1245.  (she was very talkative).

## 2017-09-18 ENCOUNTER — Telehealth: Payer: Self-pay | Admitting: Neurology

## 2017-09-18 MED ORDER — ALMOTRIPTAN MALATE 12.5 MG PO TABS: ORAL_TABLET | ORAL | 1 refills | Status: DC

## 2017-09-18 NOTE — Telephone Encounter (Signed)
I called and spoke with patient and we rescheduled her to 12/10/16. I have sent a refill for her Ax[facility] to [facility].

## 2017-09-18 NOTE — Telephone Encounter (Signed)
Pt has an appt tomorrow with Cecille Rubin. She called in she thinks she has the flu. She cannot make appt tomorrow.

## 2017-09-18 NOTE — Telephone Encounter (Signed)
This patient has an appt tomorrow with Cecille Rubin. She called in she thinks she has the flu and canceled her appt. I looked for an opening with Hoyle Sauer and did not see anything until March. Patient is almost out of her meds. She would like a call back to R/S appt and to see what she can do about her meds dg

## 2017-09-19 ENCOUNTER — Ambulatory Visit: Payer: 59 | Admitting: Nurse Practitioner

## 2017-09-28 ENCOUNTER — Ambulatory Visit: Payer: 59

## 2017-10-03 ENCOUNTER — Encounter: Payer: 59 | Admitting: Registered Nurse

## 2017-10-05 ENCOUNTER — Encounter: Payer: 59 | Attending: Physical Medicine and Rehabilitation | Admitting: Registered Nurse

## 2017-10-05 ENCOUNTER — Other Ambulatory Visit: Payer: Self-pay

## 2017-10-05 ENCOUNTER — Encounter: Payer: Self-pay | Admitting: Registered Nurse

## 2017-10-05 VITALS — BP 138/85 | HR 99

## 2017-10-05 DIAGNOSIS — M5416 Radiculopathy, lumbar region: Secondary | ICD-10-CM | POA: Diagnosis not present

## 2017-10-05 DIAGNOSIS — M546 Pain in thoracic spine: Secondary | ICD-10-CM | POA: Diagnosis not present

## 2017-10-05 DIAGNOSIS — M51369 Other intervertebral disc degeneration, lumbar region without mention of lumbar back pain or lower extremity pain: Secondary | ICD-10-CM

## 2017-10-05 DIAGNOSIS — M797 Fibromyalgia: Secondary | ICD-10-CM

## 2017-10-05 DIAGNOSIS — G43519 Persistent migraine aura without cerebral infarction, intractable, without status migrainosus: Secondary | ICD-10-CM

## 2017-10-05 DIAGNOSIS — Z5181 Encounter for therapeutic drug level monitoring: Secondary | ICD-10-CM

## 2017-10-05 DIAGNOSIS — Z79899 Other long term (current) drug therapy: Secondary | ICD-10-CM

## 2017-10-05 DIAGNOSIS — M47812 Spondylosis without myelopathy or radiculopathy, cervical region: Secondary | ICD-10-CM | POA: Diagnosis not present

## 2017-10-05 DIAGNOSIS — G894 Chronic pain syndrome: Secondary | ICD-10-CM | POA: Diagnosis not present

## 2017-10-05 DIAGNOSIS — M5136 Other intervertebral disc degeneration, lumbar region: Secondary | ICD-10-CM | POA: Insufficient documentation

## 2017-10-05 DIAGNOSIS — M5412 Radiculopathy, cervical region: Secondary | ICD-10-CM | POA: Diagnosis not present

## 2017-10-05 DIAGNOSIS — F0781 Postconcussional syndrome: Secondary | ICD-10-CM | POA: Insufficient documentation

## 2017-10-05 DIAGNOSIS — G8929 Other chronic pain: Secondary | ICD-10-CM

## 2017-10-05 DIAGNOSIS — M4716 Other spondylosis with myelopathy, lumbar region: Secondary | ICD-10-CM | POA: Diagnosis present

## 2017-10-05 DIAGNOSIS — M542 Cervicalgia: Secondary | ICD-10-CM | POA: Diagnosis not present

## 2017-10-05 MED ORDER — FENTANYL 25 MCG/HR TD PT72: 25.0000 ug | MEDICATED_PATCH | TRANSDERMAL | 0 refills | Status: DC

## 2017-10-05 MED ORDER — HYDROCODONE-ACETAMINOPHEN 5-325 MG PO TABS: 1.0000 | ORAL_TABLET | Freq: Three times a day (TID) | ORAL | 0 refills | Status: DC | PRN

## 2017-10-05 NOTE — Progress Notes (Signed)
Subjective:    Patient ID: Patty Salas, female    DOB: 02-01-1956, 62 y.o.   MRN: [id number]  HPI: [name] is a 62 year old female who returns for follow up appointmentfor chronic pain and medication refill. She states her pain is located in her neck radiating into her bilateral shoulders, mid-lower back radiating into her bilateral lower extremities and bilateral knee pain. She rates her pain 10. Her current exercise regime is performing stretching exercises with bands and walking.  [name]  reports she was walking in her home on 10/02/2017 when she tripped over a door stopper, she landed on her knees. She was able to pick herself up. Also states her husband will not remove all throw rugs and runners.  Educated on Falls Prevention she verbalizes understanding.   [name] Morphine equivalent is  79.00 MME.  She is also prescribed lorazepam by Dr. Reece Levy. We have reviewed the black box warning regarding using opioids and benzodiazepines. I highlighted the dangers of using these drugs together and discussed the adverse events including respiratory suppression, overdose, cognitive impairment and importance of  compliance with current regimen. she verbalizes understanding, we will continue to monitor and adjust as indicated.  She is being closely monitored and under the care of her psychiatrist Dr. Reece Levy.  Oral Swab  Was performed on 08/02/2017 was consistent.   Pain Inventory Average Pain 9 Pain Right Now 10 My pain is constant, sharp, burning and stabbing  In the last 24 hours, has pain interfered with the following? General activity 8 Relation with others 9 Enjoyment of life 9 What TIME of day is your pain at its worst? all times Sleep (in general) Fair  Pain is worse with: walking, bending, sitting, inactivity and standing Pain improves with: rest, heat/ice, therapy/exercise, medication and injections Relief from Meds: 5  Mobility walk without assistance walk with  assistance use a cane use a walker how many minutes can you walk? 5-10 ability to climb steps?  yes do you drive?  yes needs help with transfers transfers alone Do you have any goals in this area?  yes  Function I need assistance with the following:  meal prep, household duties and shopping Do you have any goals in this area?  yes  Neuro/Psych bladder control problems bowel control problems weakness numbness tremor tingling spasms dizziness depression anxiety  Prior Studies Any changes since last visit?  yes  Physicians involved in your care Any changes since last visit?  no   Family History  Problem Relation Age of Onset  . Cancer Mother        stomach  . Migraines Mother   . Arthritis Mother   . Emphysema Mother   . Heart disease Father   . Hypertension Father   . Cancer Father        colon  . Dementia Father   . Hypertension Brother   . Migraines Brother   . Hypertension Brother   . Migraines Brother   . Alcohol abuse Brother   . Bipolar disorder Brother   . Migraines Daughter   . Arthritis Maternal Grandmother   . Cancer Maternal Grandmother        stomach  . Diabetes Maternal Grandmother   . Stroke Maternal Grandmother   . Cancer Maternal Aunt        lung  . Emphysema Maternal Aunt   . Cancer Paternal Uncle        colon  . Hypertension Maternal  Aunt   . Heart disease Maternal Aunt   . Cancer Paternal Uncle        lung   Social History   Socioeconomic History  . Marital status: Married    Spouse name: Not on file  . Number of children: 1  . Years of education: COLLEGE1  . Highest education level: Not on file  Social Needs  . Financial resource strain: Not on file  . Food insecurity - worry: Not on file  . Food insecurity - inability: Not on file  . Transportation needs - medical: Not on file  . Transportation needs - non-medical: Not on file  Occupational History  . Occupation: HOUSEWIFE    Employer: UNEMPLOYED  Tobacco Use  .  Smoking status: Never Smoker  . Smokeless tobacco: Never Used  Substance and Sexual Activity  . Alcohol use: No  . Drug use: No  . Sexual activity: Not on file  Other Topics Concern  . Not on file  Social History Narrative   Patient is right handed.   Patient drinks caffeine occasionally.   Past Surgical History:  Procedure Laterality Date  .  kidney stones    . ABDOMINAL ADHESION SURGERY    . ABDOMINAL HYSTERECTOMY  1995   partial- hemmoraged after surgery-stitch came loose  . APPENDECTOMY  1993   hemmoraged after surgery- stitch came loose  . CERVICAL DISC SURGERY  06/04/2001   with fusion  . CHOLECYSTECTOMY  1985  . colonoscopy    . COLONOSCOPY    . CYSTOSCOPY    . FOOT SURGERY     lt  . jj stent  07/2002   with ureteroscopy, cystoscopy and then removal of stent in office  . LITHOTRIPSY    . LYMPH NODE BIOPSY Right 03/06/2014   Procedure: right groin LYMPH NODE BIOPSY;  Surgeon: Merrie Roof, MD;  Location: [facility];  Service: General;  Laterality: Right;  . LYMPHADENECTOMY N/A 12/29/2015   Procedure: RETROPERITONEAL LYMPHADENECTOMY;  Surgeon: Alexis Frock, MD;  [facility];  Service: Urology;  Laterality: N/A;  . NECK SURGERY  2002  . ROBOT ASSISTED LAPAROSCOPIC NEPHRECTOMY Left 12/29/2015   Procedure: XI ROBOTIC ASSISTED LEFT LAPAROSCOPIC NEPHRECTOMY;  Surgeon: Alexis Frock, MD;  [facility];  Service: Urology;  Laterality: Left;  . TONSILLECTOMY  1976   Past Medical History:  Diagnosis Date  . Anxiety   . Arthritis   . Asthma   . Bipolar affective ([place])   . Calcifying tendinitis of shoulder   . Carpal tunnel syndrome   . Cervical facet syndrome   . Chronic pain syndrome   . Complication of anesthesia   . Depression   . Diverticulosis   . Falls   . Fibromyalgia   . 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 Full dentures   . Gout   . Headache(784.0)    migraines  . Herpesviral infection   .  Hypertension   . IBS (irritable bowel syndrome)    with diarrhea  . Lymphadenopathy   . Myalgia and myositis, unspecified   . PONV (postoperative nausea and vomiting)   . PTSD (post-traumatic stress disorder)   . Renal calculi   . Restless legs syndrome (RLS)   . Thoracic radiculopathy   . Vitamin D deficiency   . Wears glasses    There were no vitals taken for this visit.  Opioid Risk Score:11   Fall Risk Score:  `1  Depression screen  PHQ 2/9  Depression screen PHQ 2/9 10/05/2017 09/04/2017 08/02/2017 07/05/2017 04/24/2017 01/17/2017 05/17/2016  Decreased Interest 1 1 1 3 2 2 2   Down, Depressed, Hopeless 1 1 1 3 2 2 2   PHQ - 2 Score 2 2 2 6 4 4 4   Altered sleeping - 1 - - - - -  Tired, decreased energy - 2 - - - - -  Change in appetite - 2 - - - - -  Feeling bad or failure about yourself  - 2 - - - - -  Trouble concentrating - 1 - - - - -  Moving slowly or fidgety/restless - 1 - - - - -  Suicidal thoughts - 0 - - - - -  PHQ-9 Score - 11 - - - - -  Difficult doing work/chores - Somewhat difficult - - - - -  Some recent data might be hidden    Review of Systems  Constitutional: Positive for chills, diaphoresis, fever and unexpected weight change.  HENT: Negative.   Eyes: Negative.   Respiratory: Negative.   Gastrointestinal: Positive for abdominal pain, diarrhea, nausea and vomiting.  Endocrine: Negative.   Genitourinary: Negative.   Musculoskeletal: Positive for arthralgias, back pain, gait problem, myalgias and neck pain.       Spasms  Skin: Positive for rash.  Allergic/Immunologic: Negative.   Neurological: Positive for dizziness, tremors, weakness and numbness.       Tingling  Psychiatric/Behavioral: Positive for confusion and dysphoric mood. The patient is nervous/anxious.        Objective:   Physical Exam  Constitutional: She is oriented to person, place, and time. She appears well-developed and well-nourished.  HENT:  Head: Normocephalic and atraumatic.  Neck:  Normal range of motion. Neck supple.  Cervical Paraspinal Tenderness: C-5-C-6  Cardiovascular: Normal rate and regular rhythm.  Pulmonary/Chest: Effort normal and breath sounds normal.  Musculoskeletal:  Normal Muscle Bulk and Muscle Testing Reveals: Upper Extremities: Full  ROM and Muscle Strength 4/5 Thoracic and Lumbar Hypersensitivity Lower Extremities: Full ROM and Muscle Strength 5/5 Left Lower Extremity Flexion Produces Pain into Patella Arises from Table Slowlyy using cane for support Narrow Based Gait   Neurological: She is alert and oriented to person, place, and time.  Skin: Skin is warm and dry.  Psychiatric: She has a normal mood and affect.  Nursing note and vitals reviewed.         Assessment & Plan:  1. History of fibromyalgia with myofascial pain and multiple trigger points. Continue with Heat and exercise Regime. Continue with gabapentin. 10/05/2017 2. Chronic migraine headaches.Continue to Monitor. S/P Botox.on 05/30/2017  10/05/2017 3. Midline Low Back Pain/ Lumbar Spondylosis/Lumbar degenerative disk disease, L4-5 : Refilled: Fentanyl Patch 25 mcg one patch every three days #10 andHydrocodone 5/325 mg one tablet every 6 hours as needed #90. 10/05/2017 4. 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 Urology Following. 10/05/2017 5. 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 10/05/2017 6. Cervicalgia/ Cervical RadiculitisCervical Spondylosis: Continue Gabapentin. Continue  with HEP and Continue to monitor. 10/05/2017  20 minutes of face to face patient care time was spent during this visit. All questions were encouraged and answered.   F/U in 1 month

## 2017-10-23 ENCOUNTER — Ambulatory Visit: Payer: 59

## 2017-11-05 ENCOUNTER — Encounter: Payer: 59 | Admitting: Physical Medicine & Rehabilitation

## 2017-11-07 ENCOUNTER — Encounter: Payer: 59 | Attending: Physical Medicine and Rehabilitation | Admitting: Physical Medicine & Rehabilitation

## 2017-11-07 ENCOUNTER — Encounter: Payer: Self-pay | Admitting: Physical Medicine & Rehabilitation

## 2017-11-07 VITALS — BP 147/82 | HR 110

## 2017-11-07 DIAGNOSIS — Z5181 Encounter for therapeutic drug level monitoring: Secondary | ICD-10-CM | POA: Diagnosis not present

## 2017-11-07 DIAGNOSIS — Z79899 Other long term (current) drug therapy: Secondary | ICD-10-CM | POA: Diagnosis not present

## 2017-11-07 DIAGNOSIS — F0781 Postconcussional syndrome: Secondary | ICD-10-CM | POA: Diagnosis present

## 2017-11-07 DIAGNOSIS — M4716 Other spondylosis with myelopathy, lumbar region: Secondary | ICD-10-CM | POA: Insufficient documentation

## 2017-11-07 DIAGNOSIS — M5136 Other intervertebral disc degeneration, lumbar region: Secondary | ICD-10-CM

## 2017-11-07 DIAGNOSIS — M7918 Myalgia, other site: Secondary | ICD-10-CM | POA: Diagnosis not present

## 2017-11-07 DIAGNOSIS — M797 Fibromyalgia: Secondary | ICD-10-CM | POA: Insufficient documentation

## 2017-11-07 DIAGNOSIS — G43519 Persistent migraine aura without cerebral infarction, intractable, without status migrainosus: Secondary | ICD-10-CM | POA: Diagnosis present

## 2017-11-07 DIAGNOSIS — G894 Chronic pain syndrome: Secondary | ICD-10-CM | POA: Insufficient documentation

## 2017-11-07 MED ORDER — HYDROCODONE-ACETAMINOPHEN 5-325 MG PO TABS: 1.0000 | ORAL_TABLET | Freq: Three times a day (TID) | ORAL | 0 refills | Status: DC | PRN

## 2017-11-07 MED ORDER — FENTANYL 25 MCG/HR TD PT72: 25.0000 ug | MEDICATED_PATCH | TRANSDERMAL | 0 refills | Status: DC

## 2017-11-07 MED ORDER — CYCLOBENZAPRINE HCL 10 MG PO TABS: 10.0000 mg | ORAL_TABLET | Freq: Three times a day (TID) | ORAL | 4 refills | Status: DC

## 2017-11-07 NOTE — Progress Notes (Signed)
  Procedure Note:  Trigger point injections Dx: myofascial pain   After informed consent and preparation of the skin with isopropyl alcohol, I injected the left upper rhomboids, mid trap and splenius capitis as well as left lower L3 paraspinals each with 2cc of 1% lidocaine. The patient tolerated well, and no complications were experienced. Post-injection instructions were provided.   Refilled fentanyl and hydrocodone today also. Changed flexeril rx to read #90   Meredith Staggers, MD, [facility] 11/07/2017

## 2017-11-07 NOTE — Patient Instructions (Signed)
Continue to work on regular exercise and have some fun!!!

## 2017-11-12 LAB — TOXASSURE SELECT,+ANTIDEPR,UR

## 2017-11-15 ENCOUNTER — Telehealth: Payer: Self-pay | Admitting: *Deleted

## 2017-11-15 NOTE — Telephone Encounter (Signed)
Urine drug screen for this encounter is consistent for prescribed medication 

## 2017-12-04 ENCOUNTER — Encounter: Payer: 59 | Admitting: Registered Nurse

## 2017-12-06 ENCOUNTER — Encounter: Payer: 59 | Attending: Physical Medicine and Rehabilitation | Admitting: Registered Nurse

## 2017-12-06 ENCOUNTER — Encounter: Payer: Self-pay | Admitting: Registered Nurse

## 2017-12-06 VITALS — BP 153/80 | HR 102 | Resp 14

## 2017-12-06 DIAGNOSIS — M47812 Spondylosis without myelopathy or radiculopathy, cervical region: Secondary | ICD-10-CM

## 2017-12-06 DIAGNOSIS — M4716 Other spondylosis with myelopathy, lumbar region: Secondary | ICD-10-CM | POA: Diagnosis not present

## 2017-12-06 DIAGNOSIS — M5412 Radiculopathy, cervical region: Secondary | ICD-10-CM

## 2017-12-06 DIAGNOSIS — M51369 Other intervertebral disc degeneration, lumbar region without mention of lumbar back pain or lower extremity pain: Secondary | ICD-10-CM

## 2017-12-06 DIAGNOSIS — M5136 Other intervertebral disc degeneration, lumbar region: Secondary | ICD-10-CM | POA: Diagnosis not present

## 2017-12-06 DIAGNOSIS — Z79899 Other long term (current) drug therapy: Secondary | ICD-10-CM | POA: Diagnosis not present

## 2017-12-06 DIAGNOSIS — Z5181 Encounter for therapeutic drug level monitoring: Secondary | ICD-10-CM | POA: Diagnosis not present

## 2017-12-06 DIAGNOSIS — G894 Chronic pain syndrome: Secondary | ICD-10-CM

## 2017-12-06 DIAGNOSIS — M797 Fibromyalgia: Secondary | ICD-10-CM | POA: Diagnosis not present

## 2017-12-06 DIAGNOSIS — M7918 Myalgia, other site: Secondary | ICD-10-CM

## 2017-12-06 DIAGNOSIS — G8929 Other chronic pain: Secondary | ICD-10-CM

## 2017-12-06 DIAGNOSIS — G43519 Persistent migraine aura without cerebral infarction, intractable, without status migrainosus: Secondary | ICD-10-CM | POA: Insufficient documentation

## 2017-12-06 DIAGNOSIS — M546 Pain in thoracic spine: Secondary | ICD-10-CM | POA: Diagnosis not present

## 2017-12-06 DIAGNOSIS — R296 Repeated falls: Secondary | ICD-10-CM

## 2017-12-06 DIAGNOSIS — M542 Cervicalgia: Secondary | ICD-10-CM

## 2017-12-06 DIAGNOSIS — F0781 Postconcussional syndrome: Secondary | ICD-10-CM | POA: Diagnosis present

## 2017-12-06 DIAGNOSIS — H9201 Otalgia, right ear: Secondary | ICD-10-CM

## 2017-12-06 MED ORDER — FENTANYL 25 MCG/HR TD PT72: 25.0000 ug | MEDICATED_PATCH | TRANSDERMAL | 0 refills | Status: DC

## 2017-12-06 MED ORDER — HYDROCODONE-ACETAMINOPHEN 5-325 MG PO TABS: 1.0000 | ORAL_TABLET | Freq: Three times a day (TID) | ORAL | 0 refills | Status: DC | PRN

## 2017-12-06 NOTE — Progress Notes (Signed)
Subjective:    Patient ID: Patty Salas, female    DOB: 01-24-1956, 62 y.o.   MRN: [id number]  HPI: [name] is a 62 year old female who returns for follow up appointmentfor chronic pain and medication refill. She states her pain is located in her right ear, she states she wants to f/u with her PCP regarding her ear pain. Also reports neck pain radiating into her bilateral shoulders, mid-lower back pain and bilateral hip pain.She rates her pain 10. Her current exercise regime is performing stretching exercises with bands and walking.  [name] states she has fallen twice last month, ( 11/19/2017 and 11/22/2017, both falls were in the bathroom where she tripped on the bathroom rug.  She was instructed to removed all throw rugs she states she removes them but  her husband keeps putting them down. She was encouraged to ask her husband to remove all throw rugs, she verbalizes understanding. She has an appointment with neurology on Monday 12/10/2017 she states.   [name] brought in her Fentanyl patches, one of the patches wouldn't adhere to her skin, she brought the patch in a plastic bag, this patch was destroyed per office protocol.   [name] Morphine equivalent is  78.50 MME.  She is also prescribed lorazepam by Dr. Reece Levy. We have reviewed the black box warning regarding using opioids and benzodiazepines. I highlighted the dangers of using these drugs together and discussed the adverse events including respiratory suppression, overdose, cognitive impairment and importance of  compliance with current regimen. she verbalizes understanding, we will continue to monitor and adjust as indicated.  She is being closely monitored and under the care of her psychiatrist Dr. Reece Levy.  Oral Swab  Was performed on 08/02/2017 was consistent.   Pain Inventory Average Pain 9 Pain Right Now 10 My pain is constant, sharp, burning, stabbing and aching  In the last 24 hours, has pain interfered with  the following? General activity 9 Relation with others 10 Enjoyment of life 10 What TIME of day is your pain at its worst? all  Sleep (in general) Fair  Pain is worse with: walking, bending, sitting, inactivity and standing Pain improves with: rest, heat/ice, therapy/exercise, medication and injections Relief from Meds: 4  Mobility walk without assistance walk with assistance use a cane use a walker how many minutes can you walk? 5-10 ability to climb steps?  yes do you drive?  yes needs help with transfers transfers alone Do you have any goals in this area?  yes  Function I need assistance with the following:  meal prep, household duties and shopping Do you have any goals in this area?  yes  Neuro/Psych bladder control problems bowel control problems weakness numbness tremor tingling trouble walking spasms dizziness confusion depression anxiety  Prior Studies Any changes since last visit?  yes  Physicians involved in your care Any changes since last visit?  no   Family History  Problem Relation Age of Onset  . Cancer Mother        stomach  . Migraines Mother   . Arthritis Mother   . Emphysema Mother   . Heart disease Father   . Hypertension Father   . Cancer Father        colon  . Dementia Father   . Hypertension Brother   . Migraines Brother   . Hypertension Brother   . Migraines Brother   . Alcohol abuse Brother   . Bipolar disorder Brother   .  Migraines Daughter   . Arthritis Maternal Grandmother   . Cancer Maternal Grandmother        stomach  . Diabetes Maternal Grandmother   . Stroke Maternal Grandmother   . Cancer Maternal Aunt        lung  . Emphysema Maternal Aunt   . Cancer Paternal Uncle        colon  . Hypertension Maternal Aunt   . Heart disease Maternal Aunt   . Cancer Paternal Uncle        lung   Social History   Socioeconomic History  . Marital status: Married    Spouse name: Not on file  . Number of children: 1    . Years of education: COLLEGE1  . Highest education level: Not on file  Social Needs  . Financial resource strain: Not on file  . Food insecurity - worry: Not on file  . Food insecurity - inability: Not on file  . Transportation needs - medical: Not on file  . Transportation needs - non-medical: Not on file  Occupational History  . Occupation: HOUSEWIFE    Employer: UNEMPLOYED  Tobacco Use  . Smoking status: Never Smoker  . Smokeless tobacco: Never Used  Substance and Sexual Activity  . Alcohol use: No  . Drug use: No  . Sexual activity: Not on file  Other Topics Concern  . Not on file  Social History Narrative   Patient is right handed.   Patient drinks caffeine occasionally.   Past Surgical History:  Procedure Laterality Date  .  kidney stones    . ABDOMINAL ADHESION SURGERY    . ABDOMINAL HYSTERECTOMY  1995   partial- hemmoraged after surgery-stitch came loose  . APPENDECTOMY  1993   hemmoraged after surgery- stitch came loose  . CERVICAL DISC SURGERY  06/04/2001   with fusion  . CHOLECYSTECTOMY  1985  . colonoscopy    . COLONOSCOPY    . CYSTOSCOPY    . FOOT SURGERY     lt  . jj stent  07/2002   with ureteroscopy, cystoscopy and then removal of stent in office  . LITHOTRIPSY    . LYMPH NODE BIOPSY Right 03/06/2014   Procedure: right groin LYMPH NODE BIOPSY;  Surgeon: Merrie Roof, MD;  Location: [facility];  Service: General;  Laterality: Right;  . LYMPHADENECTOMY N/A 12/29/2015   Procedure: RETROPERITONEAL LYMPHADENECTOMY;  Surgeon: Alexis Frock, MD;  Location: [facility];  Service: Urology;  Laterality: N/A;  . NECK SURGERY  2002  . ROBOT ASSISTED LAPAROSCOPIC NEPHRECTOMY Left 12/29/2015   Procedure: XI ROBOTIC ASSISTED LEFT LAPAROSCOPIC NEPHRECTOMY;  Surgeon: Alexis Frock, MD;  Location: [facility];  Service: Urology;  Laterality: Left;  . TONSILLECTOMY  1976   Past Medical History:  Diagnosis Date  . Anxiety   . Arthritis   . Asthma   .  Bipolar affective ([place])   . Calcifying tendinitis of shoulder   . Carpal tunnel syndrome   . Cervical facet syndrome   . Chronic pain syndrome   . Complication of anesthesia   . Depression   . Diverticulosis   . Falls   . Fibromyalgia   . 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 Full dentures   . Gout   . Headache(784.0)    migraines  . Herpesviral infection   . Hypertension   . IBS (irritable bowel syndrome)    with diarrhea  . Lymphadenopathy   . Myalgia  and myositis, unspecified   . PONV (postoperative nausea and vomiting)   . PTSD (post-traumatic stress disorder)   . Renal calculi   . Restless legs syndrome (RLS)   . Thoracic radiculopathy   . Vitamin D deficiency   . Wears glasses    There were no vitals taken for this visit.  Opioid Risk Score:11   Fall Risk Score:  `1  Depression screen PHQ 2/9  Depression screen [facility] 2/9 10/05/2017 09/04/2017 08/02/2017 07/05/2017 04/24/2017 01/17/2017 05/17/2016  Decreased Interest 1 1 1 3 2 2 2   Down, Depressed, Hopeless 1 1 1 3 2 2 2   PHQ - 2 Score 2 2 2 6 4 4 4   Altered sleeping - 1 - - - - -  Tired, decreased energy - 2 - - - - -  Change in appetite - 2 - - - - -  Feeling bad or failure about yourself  - 2 - - - - -  Trouble concentrating - 1 - - - - -  Moving slowly or fidgety/restless - 1 - - - - -  Suicidal thoughts - 0 - - - - -  PHQ-9 Score - 11 - - - - -  Difficult doing work/chores - Somewhat difficult - - - - -  Some recent data might be hidden    Review of Systems  Constitutional: Positive for chills, diaphoresis, fever and unexpected weight change.  HENT: Negative.   Eyes: Negative.   Respiratory: Positive for wheezing.   Gastrointestinal: Positive for abdominal pain, diarrhea, nausea and vomiting.  Endocrine: Negative.   Genitourinary: Positive for difficulty urinating.  Musculoskeletal: Positive for arthralgias, back pain, gait problem, myalgias and neck pain.       Spasms    Skin: Positive for rash.  Allergic/Immunologic: Negative.   Neurological: Positive for dizziness, tremors, weakness and numbness.       Tingling  Psychiatric/Behavioral: Positive for confusion and dysphoric mood. The patient is nervous/anxious.        Objective:   Physical Exam  Constitutional: She is oriented to person, place, and time. She appears well-developed and well-nourished.  HENT:  Head: Normocephalic and atraumatic.  Neck: Normal range of motion. Neck supple.  Cervical Paraspinal Tenderness: C-5-C-6  Cardiovascular: Normal rate and regular rhythm.  Pulmonary/Chest: Effort normal and breath sounds normal.  Musculoskeletal:  Normal Muscle Bulk and Muscle Testing Reveals: Upper Extremities: Full ROM and Muscle Strength 4/5  Bilateral AC Joint Tenderness Thoracic and Lumbar Hypersensitivity Lower Extremities: Full ROM and Muscle Strength 5/5 Arises from Table Slowlyy using cane for support Narrow Based Gait   Neurological: She is alert and oriented to person, place, and time.  Skin: Skin is warm and dry.  Psychiatric: She has a normal mood and affect.  Nursing note and vitals reviewed.         Assessment & Plan:  1. History of fibromyalgia with myofascial pain and multiple trigger points.S/P Trigger Point Injection with relief noted.  Continue with Heat and exercise Regime. Continue with gabapentin. 12/06/2017 2. Chronic migraine headaches.Continue to Monitor. S/P Botox.on 05/30/2017  12/06/2017 3. Midline Low Back Pain/ Lumbar Spondylosis/Lumbar degenerative disk disease, L4-5 : Refilled: Fentanyl Patch 25 mcg one patch every three days #10 andHydrocodone 5/325 mg one tablet every 6 hours as needed #90. 12/06/2017 4. 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 Urology Following. 12/06/2017 5. 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 12/06/2017 6. Cervicalgia/ Cervical RadiculitisCervical Spondylosis: Continue Gabapentin.  Continue  with HEP  and Continue to monitor. 12/06/2017 7. Frequent Falls: Instructed to remove all rugs from home. Has a Neurology Appointment on Monday 12/10/2017 8. Right Ear Pain: She states she will make an appointment with her PCP.  9. Muscle Spasm: Continue Flexeril as needed. 12/06/2017  20 minutes of face to face patient care time was spent during this visit. All questions were encouraged and answered.   F/U in 1 month

## 2017-12-07 NOTE — Progress Notes (Deleted)
 [facility]  PATIENT: Patty Salas DOB: 01/14/1956   REASON FOR VISIT: Follow-up for migraine HISTORY FROM:    HISTORY OF PRESENT ILLNESS: Ms. Stalnaker is a 62-year-old right-handed white female with a history of fibromyalgia and chronic daily headaches. 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 The patient has been underlying stress with recent deaths of her mother, daughter, and father. The patient takes Axert for migraines, she is followed by Dr. Schwartz and gets trigger point injections which are helpful. The patient returns this office for an evaluation.   REVIEW OF SYSTEMS: Full 14 system review of systems performed and notable only for those listed, all others are neg:  Constitutional: neg  Cardiovascular: neg Ear/Nose/Throat: neg  Skin: neg Eyes: neg Respiratory: neg Gastroitestinal: neg  Hematology/Lymphatic: neg  Endocrine: neg Musculoskeletal:neg Allergy/Immunology: neg Neurological: neg Psychiatric: neg Sleep : neg   ALLERGIES: Allergies  Allergen Reactions  . Cymbalta [Duloxetine Hcl] Anaphylaxis    Suicidal if in combination w/ Axert  . Divalproex Sodium Anaphylaxis  . Duract [Bromfenac] Anaphylaxis  . Hydrochlorothiazide Anaphylaxis    Pt loses facial movement uncontrolled;   . Imitrex [Sumatriptan Succinate] Anaphylaxis  . Latex Anaphylaxis  . Mellaril Anaphylaxis  . Olanzapine Anaphylaxis  . Penicillins Anaphylaxis  . Topamax Anaphylaxis and Other (See Comments)    Confusion, tremor, blurred vision  . Aripiprazole Other (See Comments)    Stiffened muscles  . Aspirin Hives and Itching  . Dalmane [Flurazepam Hcl] Other (See Comments)    Stiffened all muscles  . Darifenacin Hydrobromide Er Hives    Hives on back and looked like sunburn  . Metoclopramide Hcl Other (See Comments)    headache  . Seroquel [Quetiapine Fumerate] Other (See Comments)    extreme  fatigue and bad dreams  . Statins Hives  . Stelazine Other (See Comments)    Stiffened muscles  . Thorazine [Chlorpromazine Hcl] Other (See Comments)    Stiffened muscles  . Abilify [Aripiprazole]     Stiffened muscles, extrapyramidal effects all over body.   . Alka-Seltzer [Aspirin Effervescent]     Mouth ulcers, swallowing problems.   . Allopurinol Hives  . Biofreeze [Menthol (Topical Analgesic)] Hives and Itching  . Capzasin [Capsaicin] Itching    Redness   . Colchicine Hives  . Cough Drops [Benzocaine]     orthicoat sprays containing menthol or lemon flavor--breaks inside of mouth out, red rash, mouth ulcers.   . Diflucan [Fluconazole] Hives and Itching  . Iohexol      Code: HIVES, Desc: pt broke out in red rash and hives after CT injection on 12/07/09.-pt needs 13 hr prep kit, Onset Date: [id number]   . Ivp Dye [Iodinated Diagnostic Agents]   . Lyrica [Pregabalin]   . Metamucil [Psyllium]     Mouth ulcers, swallowing problems.  . Miralax [Polyethylene Glycol] Nausea And Vomiting  . Myrbetriq [Mirabegron]     swelling  . Nsaids     Mouth ulcers, swelling of mouth.   . Other Itching and Other (See Comments)    Dust, mold, feathers, wool, flannel, cologne, chlorox, household cleaning products, scented candles, cinnamon, ivory soap, paints, sun sentitive, acidic fruits, ( lemons, limes, strawbery, tartrazine yellow#5 and 6 in foods, MSG food additives, sudiium lauryl sulates, hot peppers, spearmint, wintergreen peppermint, mentol, orange, jalapenos. ---headache, breathing, welps, canker sores inside mouth, uticaria  . Potassium-Containing Compounds     Undiluted through IV. --Pain.   .   Prednisone     No  Sleep at night.   . Reglan [Metoclopramide]     Headaches.   . Renografin [Diatrizoate]     welps   . Risperdal [Risperidone] Other (See Comments)    Too sedating.   Marland Kitchen Seroquel [Quetiapine Fumarate]     Bad dreams, too sedating.  . Simvastatin Other (See Comments)     Paranoid affect, muscle spasms.   Marland Kitchen Urocit - K [Potassium Citrate]   . Zyprexa [Olanzapine]   . Avelox [Moxifloxacin Hcl In Nacl] Nausea And Vomiting  . Barium-Containing Compounds Rash  . Diclofenac Rash  . Doxycycline Nausea Only    Stomach upset.   Marland Kitchen E-Mycin [Erythromycin Base] Nausea Only  . Fiorinal [Butalbital-Aspirin-Caffeine] Itching and Rash    Welps,  . Flagyl [Metronidazole Hcl] Nausea And Vomiting  . Lamictal [Lamotrigine] Rash  . Pregabalin     Blurry vision, muscle stiffness   . Risperidone Other (See Comments)    Too sedating  . Toradol [Ketorolac Tromethamine] Itching and Rash    HOME MEDICATIONS: Outpatient Medications Prior to Visit  Medication Sig Dispense Refill  . acetaminophen (TYLENOL) 500 MG tablet Take 500 mg by mouth every 6 (six) hours as needed for moderate pain.    Marland Kitchen acyclovir (ZOVIRAX) 800 MG tablet Take 800 mg by mouth 3 (three) times daily as needed (sores on mouth.).     Marland Kitchen almotriptan (AXERT) 12.5 MG tablet TAKE 1 TABLET (12.5 MG TOTAL) BY MOUTH AS NEEDED FOR MIGRAINE (MAX 2 TABS PER WEEK). 24 tablet 1  . bisacodyl (DULCOLAX) 5 MG EC tablet Take 10 mg by mouth daily as needed for mild constipation.     . cyclobenzaprine (FLEXERIL) 10 MG tablet Take 1 tablet (10 mg total) by mouth 3 (three) times daily. 90 tablet 4  . docusate sodium (COLACE) 100 MG capsule Take 100 mg by mouth daily as needed for mild constipation.     Marland Kitchen EPINEPHrine (EPIPEN 2-PAK) 0.3 mg/0.3 mL IJ SOAJ injection Inject 0.3 mLs (0.3 mg total) daily as needed into the skin (allergic reaction.). Reported on 12/06/2015 1 Device 0  . fentaNYL (DURAGESIC - DOSED MCG/HR) 25 MCG/HR patch Place 1 patch (25 mcg total) onto the skin every 3 (three) days. 10 patch 0  . gabapentin (NEURONTIN) 300 MG capsule Take 600 mg by mouth at bedtime.    Marland Kitchen HYDROcodone-acetaminophen (NORCO/VICODIN) 5-325 MG tablet Take 1 tablet by mouth every 8 (eight) hours as needed for moderate pain. 90 tablet 0  .  hyoscyamine (LEVSIN, ANASPAZ) 0.125 MG tablet Take 0.125 mg by mouth every 4 (four) hours as needed for bladder spasms or cramping.     . lidocaine (XYLOCAINE) 2 % solution Use as directed 20 mLs in the mouth or throat daily as needed for mouth pain.     Marland Kitchen LORazepam (ATIVAN) 1 MG tablet Take 1 mg by mouth 3 (three) times daily.    . potassium chloride SA (K-DUR,KLOR-CON) 20 MEQ tablet Take 20 mEq by mouth 2 (two) times daily.    Marland Kitchen PROAIR HFA 108 (90 BASE) MCG/ACT inhaler Inhale 2 puffs into the lungs every 4 (four) hours as needed for wheezing or shortness of breath.     . promethazine (PHENERGAN) 25 MG tablet Take 25 mg by mouth every 6 (six) hours as needed for nausea or vomiting.     . Trospium Chloride 60 MG CP24 Take 1 capsule by mouth every morning.     . Vitamin D, Ergocalciferol, (DRISDOL) 50000  UNITS CAPS capsule Take 50,000 Units by mouth every 7 (seven) days.      No facility-administered medications prior to visit.     PAST MEDICAL HISTORY: Past Medical History:  Diagnosis Date  . Anxiety   . Arthritis   . Asthma   . Bipolar affective ([facility])   . Calcifying tendinitis of shoulder   . Carpal tunnel syndrome   . Cervical facet syndrome   . Chronic pain syndrome   . Complication of anesthesia   . Depression   . Diverticulosis   . Falls   . Fibromyalgia   . 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 Full dentures   . Gout   . Headache(784.0)    migraines  . Herpesviral infection   . Hypertension   . IBS (irritable bowel syndrome)    with diarrhea  . Lymphadenopathy   . Myalgia and myositis, unspecified   . PONV (postoperative nausea and vomiting)   . PTSD (post-traumatic stress disorder)   . Renal calculi   . Restless legs syndrome (RLS)   . Thoracic radiculopathy   . Vitamin D deficiency   . Wears glasses     PAST SURGICAL HISTORY: Past Surgical History:  Procedure Laterality Date  .  kidney stones    . ABDOMINAL ADHESION  SURGERY    . ABDOMINAL HYSTERECTOMY  1995   partial- hemmoraged after surgery-stitch came loose  . APPENDECTOMY  1993   hemmoraged after surgery- stitch came loose  . CERVICAL DISC SURGERY  06/04/2001   with fusion  . CHOLECYSTECTOMY  1985  . colonoscopy    . COLONOSCOPY    . CYSTOSCOPY    . FOOT SURGERY     lt  . jj stent  07/2002   with ureteroscopy, cystoscopy and then removal of stent in office  . LITHOTRIPSY    . LYMPH NODE BIOPSY Right 03/06/2014   Procedure: right groin LYMPH NODE BIOPSY;  Surgeon: Paul S Toth III, MD;  Location: [facility];  Service: General;  Laterality: Right;  . LYMPHADENECTOMY N/A 12/29/2015   Procedure: RETROPERITONEAL LYMPHADENECTOMY;  Surgeon: Theodore Manny, MD;  Location: [facility];  Service: Urology;  Laterality: N/A;  . NECK SURGERY  2002  . ROBOT ASSISTED LAPAROSCOPIC NEPHRECTOMY Left 12/29/2015   Procedure: XI ROBOTIC ASSISTED LEFT LAPAROSCOPIC NEPHRECTOMY;  Surgeon: Theodore Manny, MD;  Location: [facility];  Service: Urology;  Laterality: Left;  . TONSILLECTOMY  1976    FAMILY HISTORY: Family History  Problem Relation Age of Onset  . Cancer Mother        stomach  . Migraines Mother   . Arthritis Mother   . Emphysema Mother   . Heart disease Father   . Hypertension Father   . Cancer Father        colon  . Dementia Father   . Hypertension Brother   . Migraines Brother   . Hypertension Brother   . Migraines Brother   . Alcohol abuse Brother   . Bipolar disorder Brother   . Migraines Daughter   . Arthritis Maternal Grandmother   . Cancer Maternal Grandmother        stomach  . Diabetes Maternal Grandmother   . Stroke Maternal Grandmother   . Cancer Maternal Aunt        lung  . Emphysema Maternal Aunt   . Cancer Paternal Uncle        colon  . Hypertension Maternal Aunt   . Heart   disease Maternal Aunt   . Cancer Paternal Uncle        lung    SOCIAL HISTORY: Social History   Socioeconomic History  . Marital status:  Married    Spouse name: Not on file  . Number of children: 1  . Years of education: COLLEGE1  . Highest education level: Not on file  Social Needs  . Financial resource strain: Not on file  . Food insecurity - worry: Not on file  . Food insecurity - inability: Not on file  . Transportation needs - medical: Not on file  . Transportation needs - non-medical: Not on file  Occupational History  . Occupation: HOUSEWIFE    Employer: UNEMPLOYED  Tobacco Use  . Smoking status: Never Smoker  . Smokeless tobacco: Never Used  Substance and Sexual Activity  . Alcohol use: No  . Drug use: No  . Sexual activity: Not on file  Other Topics Concern  . Not on file  Social History Narrative   Patient is right handed.   Patient drinks caffeine occasionally.     PHYSICAL EXAM  There were no vitals filed for this visit. There is no height or weight on file to calculate BMI.  Generalized: Well developed, in no acute distress  Head: normocephalic and atraumatic,. Oropharynx benign  Neck: Supple, no carotid bruits  Cardiac: Regular rate rhythm, no murmur  Musculoskeletal: No deformity   Neurological examination   Mentation: Alert oriented to time, place, history taking. Attention span and concentration appropriate. Recent and remote memory intact.  Follows all commands speech and language fluent.   Cranial nerve II-XII: Fundoscopic exam reveals sharp disc margins.Pupils were equal round reactive to light extraocular movements were full, visual field were full on confrontational test. Facial sensation and strength were normal. hearing was intact to finger rubbing bilaterally. Uvula tongue midline. head turning and shoulder shrug were normal and symmetric.Tongue protrusion into cheek strength was normal. Motor: normal bulk and tone, full strength in the BUE, BLE, fine finger movements normal, no pronator drift. No focal weakness Sensory: normal and symmetric to light touch, pinprick, and   Vibration, proprioception  Coordination: finger-nose-finger, heel-to-shin bilaterally, no dysmetria Reflexes: Brachioradialis 2/2, biceps 2/2, triceps 2/2, patellar 2/2, Achilles 2/2, plantar responses were flexor bilaterally. Gait and Station: Rising up from seated position without assistance, normal stance,  moderate stride, good arm swing, smooth turning, able to perform tiptoe, and heel walking without difficulty. Tandem gait is steady  DIAGNOSTIC DATA (LABS, IMAGING, TESTING) - I reviewed patient records, labs, notes, testing and imaging myself where available.  Lab Results  Component Value Date   WBC 6.1 08/28/2017   HGB 12.0 08/28/2017   HCT 37.1 08/28/2017   MCV 94 08/28/2017   PLT 350 08/28/2017      Component Value Date/Time   NA 143 08/28/2017 1114   NA 140 12/01/2014 1039   K 4.2 08/28/2017 1114   K 2.8 (LL) 12/01/2014 1039   CL 98 08/28/2017 1114   CO2 29 08/28/2017 1114   CO2 30 (H) 12/01/2014 1039   GLUCOSE 102 (H) 08/28/2017 1114   GLUCOSE 118 (H) 12/30/2015 0444   GLUCOSE 143 (H) 12/01/2014 1039   BUN 13 08/28/2017 1114   BUN 14.3 12/01/2014 1039   CREATININE 0.88 08/28/2017 1114   CREATININE 0.8 12/01/2014 1039   CALCIUM 10.1 08/28/2017 1114   CALCIUM 10.0 12/01/2014 1039   PROT 8.2 08/28/2017 1114   PROT 8.4 (H) 12/01/2014 1039   ALBUMIN 4.8 08/28/2017   1114   ALBUMIN 4.1 12/01/2014 1039   AST 29 08/28/2017 1114   AST 23 12/01/2014 1039   ALT 15 08/28/2017 1114   ALT 14 12/01/2014 1039   ALKPHOS 89 08/28/2017 1114   ALKPHOS 71 12/01/2014 1039   BILITOT 0.4 08/28/2017 1114   BILITOT 0.22 12/01/2014 1039   GFRNONAA 71 08/28/2017 1114   GFRAA 81 08/28/2017 1114   Lab Results  Component Value Date   CHOL 250 (H) 08/28/2017   HDL 67 08/28/2017   LDLCALC 159 (H) 08/28/2017   TRIG 120 08/28/2017   CHOLHDL 3.7 08/28/2017   Lab Results  Component Value Date   HGBA1C 5.7 (H) 08/28/2017   Lab Results  Component Value Date   VITAMINB12 299  08/28/2017   Lab Results  Component Value Date   TSH 1.460 08/28/2017    ***  ASSESSMENT AND PLAN  62 y.o. year old female  With Chronic daily headache  The patient will be given another prescription for Axert, she gets no other medications through this office. She will follow-up in one year, sooner if needed.  Dennie Bible, [facility], [facility], APRN  [facility] [address], [place], [place] [address] [phone]

## 2017-12-10 ENCOUNTER — Telehealth: Payer: Self-pay | Admitting: *Deleted

## 2017-12-10 ENCOUNTER — Ambulatory Visit: Payer: Self-pay | Admitting: Nurse Practitioner

## 2017-12-10 ENCOUNTER — Encounter: Payer: Self-pay | Admitting: Nurse Practitioner

## 2017-12-10 NOTE — Telephone Encounter (Signed)
Patient called today and cancelled her follow up; stated she has the flu.  She rescheduled.

## 2017-12-13 NOTE — Progress Notes (Deleted)
[facility]  PATIENT: Patty Salas DOB: 1956-08-27   REASON FOR VISIT: Follow-up for migraine HISTORY FROM:    HISTORY OF PRESENT ILLNESS: Patty Salas is a 62 year old right-handed white female with a history of fibromyalgia and chronic daily headaches. 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 The patient has been underlying stress with recent deaths of her mother, daughter, and father. The patient takes Axert for migraines, she is followed by Dr. Tessa Lerner and gets trigger point injections which are helpful. The patient returns this office for an evaluation.   REVIEW OF SYSTEMS: Full 14 system review of systems performed and notable only for those listed, all others are neg:  Constitutional: neg  Cardiovascular: neg Ear/Nose/Throat: neg  Skin: neg Eyes: neg Respiratory: neg Gastroitestinal: neg  Hematology/Lymphatic: neg  Endocrine: neg Musculoskeletal:neg Allergy/Immunology: neg Neurological: neg Psychiatric: neg Sleep : neg   ALLERGIES: Allergies  Allergen Reactions  . Cymbalta [Duloxetine Hcl] Anaphylaxis    Suicidal if in combination w/ Axert  . Divalproex Sodium Anaphylaxis  . Duract [Bromfenac] Anaphylaxis  . Hydrochlorothiazide Anaphylaxis    Pt loses facial movement uncontrolled;   . Imitrex [Sumatriptan Succinate] Anaphylaxis  . Latex Anaphylaxis  . Mellaril Anaphylaxis  . Olanzapine Anaphylaxis  . Penicillins Anaphylaxis  . Topamax Anaphylaxis and Other (See Comments)    Confusion, tremor, blurred vision  . Aripiprazole Other (See Comments)    Stiffened muscles  . Aspirin Hives and Itching  . Dalmane [Flurazepam Hcl] Other (See Comments)    Stiffened all muscles  . Darifenacin Hydrobromide Er Hives    Hives on back and looked like sunburn  . Metoclopramide Hcl Other (See Comments)    headache  . Seroquel [Quetiapine Fumerate] Other (See Comments)    extreme  fatigue and bad dreams  . Statins Hives  . Stelazine Other (See Comments)    Stiffened muscles  . Thorazine [Chlorpromazine Hcl] Other (See Comments)    Stiffened muscles  . Abilify [Aripiprazole]     Stiffened muscles, extrapyramidal effects all over body.   . Alka-Seltzer [Aspirin Effervescent]     Mouth ulcers, swallowing problems.   . Allopurinol Hives  . Biofreeze [Menthol (Topical Analgesic)] Hives and Itching  . Capzasin [Capsaicin] Itching    Redness   . Colchicine Hives  . Cough Drops [Benzocaine]     orthicoat sprays containing menthol or lemon flavor--breaks inside of mouth out, red rash, mouth ulcers.   . Diflucan [Fluconazole] Hives and Itching  . Iohexol      Code: HIVES, Desc: pt broke out in red rash and hives after CT injection on 12/07/09.-pt needs 13 hr prep kit, Onset Date: [id number]   . Ivp Dye [Iodinated Diagnostic Agents]   . Lyrica [Pregabalin]   . Metamucil [Psyllium]     Mouth ulcers, swallowing problems.  Merril Abbe [Polyethylene Glycol] Nausea And Vomiting  . Myrbetriq [Mirabegron]     swelling  . Nsaids     Mouth ulcers, swelling of mouth.   . Other Itching and Other (See Comments)    Dust, mold, feathers, wool, flannel, cologne, chlorox, household cleaning products, scented candles, cinnamon, ivory soap, paints, sun sentitive, acidic fruits, ( lemons, limes, strawbery, tartrazine yellow#5 and 6 in foods, MSG food additives, sudiium lauryl sulates, hot peppers, spearmint, wintergreen peppermint, mentol, orange, jalapenos. ---headache, breathing, welps, canker sores inside mouth, uticaria  . Potassium-Containing Compounds     Undiluted through IV. --Pain.   Marland Kitchen  Prednisone     No  Sleep at night.   . Reglan [Metoclopramide]     Headaches.   . Renografin [Diatrizoate]     welps   . Risperdal [Risperidone] Other (See Comments)    Too sedating.   Marland Kitchen Seroquel [Quetiapine Fumarate]     Bad dreams, too sedating.  . Simvastatin Other (See Comments)     Paranoid affect, muscle spasms.   Marland Kitchen Urocit - K [Potassium Citrate]   . Zyprexa [Olanzapine]   . Avelox [Moxifloxacin Hcl In Nacl] Nausea And Vomiting  . Barium-Containing Compounds Rash  . Diclofenac Rash  . Doxycycline Nausea Only    Stomach upset.   Marland Kitchen E-Mycin [Erythromycin Base] Nausea Only  . Fiorinal [Butalbital-Aspirin-Caffeine] Itching and Rash    Welps,  . Flagyl [Metronidazole Hcl] Nausea And Vomiting  . Lamictal [Lamotrigine] Rash  . Pregabalin     Blurry vision, muscle stiffness   . Risperidone Other (See Comments)    Too sedating  . Toradol [Ketorolac Tromethamine] Itching and Rash    HOME MEDICATIONS: Outpatient Medications Prior to Visit  Medication Sig Dispense Refill  . acetaminophen (TYLENOL) 500 MG tablet Take 500 mg by mouth every 6 (six) hours as needed for moderate pain.    Marland Kitchen acyclovir (ZOVIRAX) 800 MG tablet Take 800 mg by mouth 3 (three) times daily as needed (sores on mouth.).     Marland Kitchen almotriptan (AXERT) 12.5 MG tablet TAKE 1 TABLET (12.5 MG TOTAL) BY MOUTH AS NEEDED FOR MIGRAINE (MAX 2 TABS PER WEEK). 24 tablet 1  . bisacodyl (DULCOLAX) 5 MG EC tablet Take 10 mg by mouth daily as needed for mild constipation.     . cyclobenzaprine (FLEXERIL) 10 MG tablet Take 1 tablet (10 mg total) by mouth 3 (three) times daily. 90 tablet 4  . docusate sodium (COLACE) 100 MG capsule Take 100 mg by mouth daily as needed for mild constipation.     Marland Kitchen EPINEPHrine (EPIPEN 2-PAK) 0.3 mg/0.3 mL IJ SOAJ injection Inject 0.3 mLs (0.3 mg total) daily as needed into the skin (allergic reaction.). Reported on 12/06/2015 1 Device 0  . fentaNYL (DURAGESIC - DOSED MCG/HR) 25 MCG/HR patch Place 1 patch (25 mcg total) onto the skin every 3 (three) days. 10 patch 0  . gabapentin (NEURONTIN) 300 MG capsule Take 600 mg by mouth at bedtime.    Marland Kitchen HYDROcodone-acetaminophen (NORCO/VICODIN) 5-325 MG tablet Take 1 tablet by mouth every 8 (eight) hours as needed for moderate pain. 90 tablet 0  .  hyoscyamine (LEVSIN, ANASPAZ) 0.125 MG tablet Take 0.125 mg by mouth every 4 (four) hours as needed for bladder spasms or cramping.     . lidocaine (XYLOCAINE) 2 % solution Use as directed 20 mLs in the mouth or throat daily as needed for mouth pain.     Marland Kitchen LORazepam (ATIVAN) 1 MG tablet Take 1 mg by mouth 3 (three) times daily.    . potassium chloride SA (K-DUR,KLOR-CON) 20 MEQ tablet Take 20 mEq by mouth 2 (two) times daily.    Marland Kitchen PROAIR HFA 108 (90 BASE) MCG/ACT inhaler Inhale 2 puffs into the lungs every 4 (four) hours as needed for wheezing or shortness of breath.     . promethazine (PHENERGAN) 25 MG tablet Take 25 mg by mouth every 6 (six) hours as needed for nausea or vomiting.     . Trospium Chloride 60 MG CP24 Take 1 capsule by mouth every morning.     . Vitamin D, Ergocalciferol, (DRISDOL) 50000  UNITS CAPS capsule Take 50,000 Units by mouth every 7 (seven) days.      No facility-administered medications prior to visit.     PAST MEDICAL HISTORY: Past Medical History:  Diagnosis Date  . Anxiety   . Arthritis   . Asthma   . Bipolar affective ([place])   . Calcifying tendinitis of shoulder   . Carpal tunnel syndrome   . Cervical facet syndrome   . Chronic pain syndrome   . Complication of anesthesia   . Depression   . Diverticulosis   . Falls   . Fibromyalgia   . 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 Full dentures   . Gout   . Headache(784.0)    migraines  . Herpesviral infection   . Hypertension   . IBS (irritable bowel syndrome)    with diarrhea  . Lymphadenopathy   . Myalgia and myositis, unspecified   . PONV (postoperative nausea and vomiting)   . PTSD (post-traumatic stress disorder)   . Renal calculi   . Restless legs syndrome (RLS)   . Thoracic radiculopathy   . Vitamin D deficiency   . Wears glasses     PAST SURGICAL HISTORY: Past Surgical History:  Procedure Laterality Date  .  kidney stones    . ABDOMINAL ADHESION  SURGERY    . ABDOMINAL HYSTERECTOMY  1995   partial- hemmoraged after surgery-stitch came loose  . APPENDECTOMY  1993   hemmoraged after surgery- stitch came loose  . CERVICAL DISC SURGERY  06/04/2001   with fusion  . CHOLECYSTECTOMY  1985  . colonoscopy    . COLONOSCOPY    . CYSTOSCOPY    . FOOT SURGERY     lt  . jj stent  07/2002   with ureteroscopy, cystoscopy and then removal of stent in office  . LITHOTRIPSY    . LYMPH NODE BIOPSY Right 03/06/2014   Procedure: right groin LYMPH NODE BIOPSY;  Surgeon: Merrie Roof, MD;  Location: [facility];  Service: General;  Laterality: Right;  . LYMPHADENECTOMY N/A 12/29/2015   Procedure: RETROPERITONEAL LYMPHADENECTOMY;  Surgeon: Alexis Frock, MD;  Location: [facility];  Service: Urology;  Laterality: N/A;  . NECK SURGERY  2002  . ROBOT ASSISTED LAPAROSCOPIC NEPHRECTOMY Left 12/29/2015   Procedure: XI ROBOTIC ASSISTED LEFT LAPAROSCOPIC NEPHRECTOMY;  Surgeon: Alexis Frock, MD;  Location: [facility];  Service: Urology;  Laterality: Left;  . TONSILLECTOMY  1976    FAMILY HISTORY: Family History  Problem Relation Age of Onset  . Cancer Mother        stomach  . Migraines Mother   . Arthritis Mother   . Emphysema Mother   . Heart disease Father   . Hypertension Father   . Cancer Father        colon  . Dementia Father   . Hypertension Brother   . Migraines Brother   . Hypertension Brother   . Migraines Brother   . Alcohol abuse Brother   . Bipolar disorder Brother   . Migraines Daughter   . Arthritis Maternal Grandmother   . Cancer Maternal Grandmother        stomach  . Diabetes Maternal Grandmother   . Stroke Maternal Grandmother   . Cancer Maternal Aunt        lung  . Emphysema Maternal Aunt   . Cancer Paternal Uncle        colon  . Hypertension Maternal Aunt   . Heart  disease Maternal Aunt   . Cancer Paternal Uncle        lung    SOCIAL HISTORY: Social History   Socioeconomic History  . Marital status:  Married    Spouse name: Not on file  . Number of children: 1  . Years of education: COLLEGE1  . Highest education level: Not on file  Social Needs  . Financial resource strain: Not on file  . Food insecurity - worry: Not on file  . Food insecurity - inability: Not on file  . Transportation needs - medical: Not on file  . Transportation needs - non-medical: Not on file  Occupational History  . Occupation: HOUSEWIFE    Employer: UNEMPLOYED  Tobacco Use  . Smoking status: Never Smoker  . Smokeless tobacco: Never Used  Substance and Sexual Activity  . Alcohol use: No  . Drug use: No  . Sexual activity: Not on file  Other Topics Concern  . Not on file  Social History Narrative   Patient is right handed.   Patient drinks caffeine occasionally.     PHYSICAL EXAM  There were no vitals filed for this visit. There is no height or weight on file to calculate BMI.  Generalized: Well developed, in no acute distress  Head: normocephalic and atraumatic,. Oropharynx benign  Neck: Supple, no carotid bruits  Cardiac: Regular rate rhythm, no murmur  Musculoskeletal: No deformity   Neurological examination   Mentation: Alert oriented to time, place, history taking. Attention span and concentration appropriate. Recent and remote memory intact.  Follows all commands speech and language fluent.   Cranial nerve II-XII: Fundoscopic exam reveals sharp disc margins.Pupils were equal round reactive to light extraocular movements were full, visual field were full on confrontational test. Facial sensation and strength were normal. hearing was intact to finger rubbing bilaterally. Uvula tongue midline. head turning and shoulder shrug were normal and symmetric.Tongue protrusion into cheek strength was normal. Motor: normal bulk and tone, full strength in the BUE, BLE, fine finger movements normal, no pronator drift. No focal weakness Sensory: normal and symmetric to light touch, pinprick, and   Vibration, proprioception  Coordination: finger-nose-finger, heel-to-shin bilaterally, no dysmetria Reflexes: Brachioradialis 2/2, biceps 2/2, triceps 2/2, patellar 2/2, Achilles 2/2, plantar responses were flexor bilaterally. Gait and Station: Rising up from seated position without assistance, normal stance,  moderate stride, good arm swing, smooth turning, able to perform tiptoe, and heel walking without difficulty. Tandem gait is steady  DIAGNOSTIC DATA (LABS, IMAGING, TESTING) - I reviewed patient records, labs, notes, testing and imaging myself where available.  Lab Results  Component Value Date   WBC 6.1 08/28/2017   HGB 12.0 08/28/2017   HCT 37.1 08/28/2017   MCV 94 08/28/2017   PLT 350 08/28/2017      Component Value Date/Time   NA 143 08/28/2017 1114   NA 140 12/01/2014 1039   K 4.2 08/28/2017 1114   K 2.8 (LL) 12/01/2014 1039   CL 98 08/28/2017 1114   CO2 29 08/28/2017 1114   CO2 30 (H) 12/01/2014 1039   GLUCOSE 102 (H) 08/28/2017 1114   GLUCOSE 118 (H) 12/30/2015 0444   GLUCOSE 143 (H) 12/01/2014 1039   BUN 13 08/28/2017 1114   BUN 14.3 12/01/2014 1039   CREATININE 0.88 08/28/2017 1114   CREATININE 0.8 12/01/2014 1039   CALCIUM 10.1 08/28/2017 1114   CALCIUM 10.0 12/01/2014 1039   PROT 8.2 08/28/2017 1114   PROT 8.4 (H) 12/01/2014 1039   ALBUMIN 4.8 08/28/2017  1114   ALBUMIN 4.1 12/01/2014 1039   AST 29 08/28/2017 1114   AST 23 12/01/2014 1039   ALT 15 08/28/2017 1114   ALT 14 12/01/2014 1039   ALKPHOS 89 08/28/2017 1114   ALKPHOS 71 12/01/2014 1039   BILITOT 0.4 08/28/2017 1114   BILITOT 0.22 12/01/2014 1039   GFRNONAA 71 08/28/2017 1114   GFRAA 81 08/28/2017 1114   Lab Results  Component Value Date   CHOL 250 (H) 08/28/2017   HDL 67 08/28/2017   LDLCALC 159 (H) 08/28/2017   TRIG 120 08/28/2017   CHOLHDL 3.7 08/28/2017   Lab Results  Component Value Date   HGBA1C 5.7 (H) 08/28/2017   Lab Results  Component Value Date   VITAMINB12 299  08/28/2017   Lab Results  Component Value Date   TSH 1.460 08/28/2017    ***  ASSESSMENT AND PLAN  62 y.o. year old female  With Chronic daily headache  The patient will be given another prescription for Axert, she gets no other medications through this office. She will follow-up in one year, sooner if needed.  Dennie Bible, [facility], [facility], APRN  [facility] [address], [place], [place] [address] [phone]

## 2017-12-18 ENCOUNTER — Ambulatory Visit: Payer: 59 | Admitting: Nurse Practitioner

## 2017-12-20 ENCOUNTER — Ambulatory Visit: Payer: 59

## 2018-01-03 ENCOUNTER — Encounter: Payer: 59 | Admitting: Registered Nurse

## 2018-01-04 ENCOUNTER — Encounter: Payer: 59 | Admitting: Registered Nurse

## 2018-01-07 ENCOUNTER — Encounter: Payer: Self-pay | Admitting: Registered Nurse

## 2018-01-07 ENCOUNTER — Encounter: Payer: 59 | Attending: Physical Medicine and Rehabilitation | Admitting: Registered Nurse

## 2018-01-07 VITALS — BP 139/53 | HR 104 | Resp 14 | Ht 65.0 in | Wt 151.0 lb

## 2018-01-07 DIAGNOSIS — M546 Pain in thoracic spine: Secondary | ICD-10-CM

## 2018-01-07 DIAGNOSIS — M47812 Spondylosis without myelopathy or radiculopathy, cervical region: Secondary | ICD-10-CM

## 2018-01-07 DIAGNOSIS — G8929 Other chronic pain: Secondary | ICD-10-CM

## 2018-01-07 DIAGNOSIS — K648 Other hemorrhoids: Secondary | ICD-10-CM | POA: Diagnosis not present

## 2018-01-07 DIAGNOSIS — M797 Fibromyalgia: Secondary | ICD-10-CM | POA: Diagnosis present

## 2018-01-07 DIAGNOSIS — F0781 Postconcussional syndrome: Secondary | ICD-10-CM | POA: Diagnosis present

## 2018-01-07 DIAGNOSIS — Z79899 Other long term (current) drug therapy: Secondary | ICD-10-CM | POA: Insufficient documentation

## 2018-01-07 DIAGNOSIS — G894 Chronic pain syndrome: Secondary | ICD-10-CM | POA: Diagnosis not present

## 2018-01-07 DIAGNOSIS — G43519 Persistent migraine aura without cerebral infarction, intractable, without status migrainosus: Secondary | ICD-10-CM | POA: Insufficient documentation

## 2018-01-07 DIAGNOSIS — K580 Irritable bowel syndrome with diarrhea: Secondary | ICD-10-CM | POA: Diagnosis not present

## 2018-01-07 DIAGNOSIS — M4716 Other spondylosis with myelopathy, lumbar region: Secondary | ICD-10-CM | POA: Insufficient documentation

## 2018-01-07 DIAGNOSIS — M542 Cervicalgia: Secondary | ICD-10-CM | POA: Diagnosis not present

## 2018-01-07 DIAGNOSIS — Z800 Family history of malignant neoplasm of digestive organs: Secondary | ICD-10-CM | POA: Insufficient documentation

## 2018-01-07 DIAGNOSIS — M7918 Myalgia, other site: Secondary | ICD-10-CM | POA: Diagnosis not present

## 2018-01-07 DIAGNOSIS — M5136 Other intervertebral disc degeneration, lumbar region: Secondary | ICD-10-CM

## 2018-01-07 DIAGNOSIS — M5412 Radiculopathy, cervical region: Secondary | ICD-10-CM | POA: Diagnosis not present

## 2018-01-07 DIAGNOSIS — Z5181 Encounter for therapeutic drug level monitoring: Secondary | ICD-10-CM

## 2018-01-07 DIAGNOSIS — Z86010 Personal history of colonic polyps: Secondary | ICD-10-CM | POA: Insufficient documentation

## 2018-01-07 MED ORDER — HYDROCODONE-ACETAMINOPHEN 5-325 MG PO TABS: 1.0000 | ORAL_TABLET | Freq: Three times a day (TID) | ORAL | 0 refills | Status: DC | PRN

## 2018-01-07 MED ORDER — FENTANYL 25 MCG/HR TD PT72: 25.0000 ug | MEDICATED_PATCH | TRANSDERMAL | 0 refills | Status: DC

## 2018-01-07 NOTE — Progress Notes (Signed)
Subjective:    Patient ID: Patty Salas, female    DOB: November 04, 1955, 62 y.o.   MRN: [id number]  HPI: Mrs.Patty Salas is a 62 year old female who returns for follow up appointment for chronic pain and medication refill. She states her pain is located in her neck radiating into her bilateral shoulders, mid-lower back and bilateral knee pain. She rates her pain 10. Her current exercise regime is performing stretching exercises and walking.   [name] Morphine Equivalent is 70.00 MME. She is also prescribed lorazepam by Dr. Reece Levy. We have discussed the black box warning of using opioids and benzodiazepines. I highlighted the dangers of using these drugs together and discussed the adverse events including respiratory suppression, overdose, cognitive impairment and importance of  compliance with current regimen. She verbalizes understanding, we will continue to monitor and adjust as indicated.  She is being closely monitored and under the care of her psychiatrist Dr. Reece Levy.   Last UDS was performed on 11/07/2017, it was consistent.    Pain Inventory Average Pain 9 Pain Right Now 10 My pain is constant, sharp, burning, stabbing and aching  In the last 24 hours, has pain interfered with the following? General activity 9 Relation with others 9 Enjoyment of life 9 What TIME of day is your pain at its worst? all Sleep (in general) Fair  Pain is worse with: walking, bending, sitting, inactivity, standing and some activites Pain improves with: rest, heat/ice, therapy/exercise, medication and injections Relief from Meds: 5  Mobility walk without assistance walk with assistance use a cane use a walker how many minutes can you walk? 5-10 ability to climb steps?  yes do you drive?  yes needs help with transfers transfers alone Do you have any goals in this area?  yes  Function I need assistance with the following:  meal prep, household duties and shopping Do you have any goals in this  area?  yes  Neuro/Psych bladder control problems bowel control problems weakness numbness tremor tingling trouble walking spasms dizziness confusion depression anxiety  Prior Studies Any changes since last visit?  no  Physicians involved in your care Any changes since last visit?  no   Family History  Problem Relation Age of Onset  . Cancer Mother        stomach  . Migraines Mother   . Arthritis Mother   . Emphysema Mother   . Heart disease Father   . Hypertension Father   . Cancer Father        colon  . Dementia Father   . Hypertension Brother   . Migraines Brother   . Hypertension Brother   . Migraines Brother   . Alcohol abuse Brother   . Bipolar disorder Brother   . Migraines Daughter   . Arthritis Maternal Grandmother   . Cancer Maternal Grandmother        stomach  . Diabetes Maternal Grandmother   . Stroke Maternal Grandmother   . Cancer Maternal Aunt        lung  . Emphysema Maternal Aunt   . Cancer Paternal Uncle        colon  . Hypertension Maternal Aunt   . Heart disease Maternal Aunt   . Cancer Paternal Uncle        lung   Social History   Socioeconomic History  . Marital status: Married    Spouse name: Not on file  . Number of children: 1  . Years of education: COLLEGE1  .  Highest education level: Not on file  Occupational History  . Occupation: HOUSEWIFE    Employer: UNEMPLOYED  Social Needs  . Financial resource strain: Not on file  . Food insecurity:    Worry: Not on file    Inability: Not on file  . Transportation needs:    Medical: Not on file    Non-medical: Not on file  Tobacco Use  . Smoking status: Never Smoker  . Smokeless tobacco: Never Used  Substance and Sexual Activity  . Alcohol use: No  . Drug use: No  . Sexual activity: Not on file  Lifestyle  . Physical activity:    Days per week: Not on file    Minutes per session: Not on file  . Stress: Not on file  Relationships  . Social connections:    Talks  on phone: Not on file    Gets together: Not on file    Attends religious service: Not on file    Active member of club or organization: Not on file    Attends meetings of clubs or organizations: Not on file    Relationship status: Not on file  Other Topics Concern  . Not on file  Social History Narrative   Patient is right handed.   Patient drinks caffeine occasionally.   Past Surgical History:  Procedure Laterality Date  .  kidney stones    . ABDOMINAL ADHESION SURGERY    . ABDOMINAL HYSTERECTOMY  1995   partial- hemmoraged after surgery-stitch came loose  . APPENDECTOMY  1993   hemmoraged after surgery- stitch came loose  . CERVICAL DISC SURGERY  06/04/2001   with fusion  . CHOLECYSTECTOMY  1985  . colonoscopy    . COLONOSCOPY    . CYSTOSCOPY    . FOOT SURGERY     lt  . jj stent  07/2002   with ureteroscopy, cystoscopy and then removal of stent in office  . LITHOTRIPSY    . LYMPH NODE BIOPSY Right 03/06/2014   Procedure: right groin LYMPH NODE BIOPSY;  Surgeon: Merrie Roof, MD;  Location: [place];  Service: General;  Laterality: Right;  . LYMPHADENECTOMY N/A 12/29/2015   Procedure: RETROPERITONEAL LYMPHADENECTOMY;  Surgeon: Alexis Frock, MD;  Location: [facility];  Service: Urology;  Laterality: N/A;  . NECK SURGERY  2002  . ROBOT ASSISTED LAPAROSCOPIC NEPHRECTOMY Left 12/29/2015   Procedure: XI ROBOTIC ASSISTED LEFT LAPAROSCOPIC NEPHRECTOMY;  Surgeon: Alexis Frock, MD;  Location: [facility];  Service: Urology;  Laterality: Left;  . TONSILLECTOMY  1976   Past Medical History:  Diagnosis Date  . Anxiety   . Arthritis   . Asthma   . Bipolar affective ([place])   . Calcifying tendinitis of shoulder   . Carpal tunnel syndrome   . Cervical facet syndrome   . Chronic pain syndrome   . Complication of anesthesia   . Depression   . Diverticulosis   . Falls   . Fibromyalgia   . 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 Full  dentures   . Gout   . Headache(784.0)    migraines  . Herpesviral infection   . Hypertension   . IBS (irritable bowel syndrome)    with diarrhea  . Lymphadenopathy   . Myalgia and myositis, unspecified   . PONV (postoperative nausea and vomiting)   . PTSD (post-traumatic stress disorder)   . Renal calculi   . Restless legs syndrome (RLS)   . Thoracic  radiculopathy   . Vitamin D deficiency   . Wears glasses    BP (!) 139/53 (BP Location: Right Arm, Patient Position: Sitting, Cuff Size: Normal)   Pulse (!) 104   Resp 14   Ht 5\' 5"  (1.651 m)   Wt 151 lb (68.5 kg)   SpO2 95%   BMI 25.13 kg/m   Opioid Risk Score:   Fall Risk Score:  `1  Depression screen PHQ 2/9  Depression screen [facility] 2/9 10/05/2017 09/04/2017 08/02/2017 07/05/2017 04/24/2017 01/17/2017 05/17/2016  Decreased Interest 1 1 1 3 2 2 2   Down, Depressed, Hopeless 1 1 1 3 2 2 2   PHQ - 2 Score 2 2 2 6 4 4 4   Altered sleeping - 1 - - - - -  Tired, decreased energy - 2 - - - - -  Change in appetite - 2 - - - - -  Feeling bad or failure about yourself  - 2 - - - - -  Trouble concentrating - 1 - - - - -  Moving slowly or fidgety/restless - 1 - - - - -  Suicidal thoughts - 0 - - - - -  PHQ-9 Score - 11 - - - - -  Difficult doing work/chores - Somewhat difficult - - - - -  Some recent data might be hidden    Review of Systems  Constitutional: Positive for appetite change.  HENT: Negative.   Eyes: Negative.   Respiratory: Positive for wheezing.   Cardiovascular: Positive for leg swelling.  Gastrointestinal: Positive for abdominal pain, constipation, diarrhea, nausea and vomiting.  Endocrine: Positive for polyphagia.  Genitourinary: Positive for difficulty urinating.  Musculoskeletal: Positive for arthralgias, back pain, gait problem, myalgias, neck pain and neck stiffness.  Skin: Negative.   Allergic/Immunologic: Negative.   Neurological: Positive for dizziness, tremors, weakness and numbness.       Tingling     Psychiatric/Behavioral: Positive for dysphoric mood. The patient is nervous/anxious.        Objective:   Physical Exam  Constitutional: She is oriented to person, place, and time. She appears well-developed and well-nourished.  HENT:  Head: Normocephalic and atraumatic.  Neck: Normal range of motion. Neck supple.  Cervical Paraspinal Tenderness: C-5-C-6  Cardiovascular: Normal rate and regular rhythm.  Pulmonary/Chest: Effort normal and breath sounds normal.  Musculoskeletal:  Normal Muscle Bulk and Muscle Testing Reveals: Upper Extremities: Full ROM and Muscle Strength 5/5 Bilateral AC Joint Tenderness Thoracic Paraspinal Tenderness: T-1-T-3 T-7-T-9  Lumbar Paraspinal Tenderness: L-4-L-5 Lower Extremities: Full ROM and Muscle Strength 5/5 Bilateral Lower Extremities Flexion Produces Pain into Bilateral Patella's Arises from Table with ease Narrow Based Gait  Neurological: She is alert and oriented to person, place, and time.  Skin: Skin is warm and dry.  Psychiatric: She has a normal mood and affect.  Nursing note and vitals reviewed.         Assessment & Plan:  1. History of fibromyalgia with myofascial pain and multiple trigger points.S/P Trigger Point Injection with relief noted.  Continue with Heat and exercise Regime. Continue with current medication regimen with  gabapentin. 01/07/2018 2. Chronic migraine headaches.Continue to Monitor. S/P Botox.on 05/30/2017  01/07/2018 3. Midline Low Back Pain/ Lumbar Spondylosis/Lumbar degenerative disk disease, L4-5 :Continue with HEP and current medication regimen.  Refilled: Fentanyl Patch 25 mcg one patch every three days #10 andHydrocodone 5/325 mg one tablet every 6 hours as needed #90. 01/07/2018 4. 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 Urology Following.  01/07/2018 5. 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 01/07/2018 6. Cervicalgia/ Cervical RadiculitisCervical Spondylosis:  Continue current medication regiment with  Gabapentin. Continue  with HEP and Continue to monitor. 01/07/2018 7. Frequent Falls: Instructed to remove all rugs from home.  Neurology Following. Continue to Monitor 01/07/2018.  8. Muscle Spasm: Continue current medication regiment with Flexeril as needed. 01/07/2018  20 minutes of face to face patient care time was spent during this visit. All questions were encouraged and answered.  Follow up in 1 month

## 2018-01-22 ENCOUNTER — Telehealth: Payer: Self-pay | Admitting: Family Medicine

## 2018-01-22 ENCOUNTER — Telehealth: Payer: Self-pay

## 2018-01-22 NOTE — Telephone Encounter (Signed)
Noted and passed on information to Dr. Raliegh Scarlet.  MPulliam, CMA/RT(R)

## 2018-01-22 NOTE — Telephone Encounter (Signed)
Patient called states is scheduled to have a Colonoscopy w/ Dr. Earlean Shawl due to family Hx of Colon Ca--- Pt says now has purchased a BP monitor & is tracking BP and keeping log as told to by Dr. Raliegh Scarlet--- just pcp to know.   Forwarding message to medical assistant. --Dion Body

## 2018-01-22 NOTE — Telephone Encounter (Signed)
Pt called stating that she is having colonoscopy April 30 and minor surgery for hemorrhoids.

## 2018-01-29 DIAGNOSIS — K648 Other hemorrhoids: Secondary | ICD-10-CM | POA: Diagnosis not present

## 2018-01-29 DIAGNOSIS — Z86010 Personal history of colonic polyps: Secondary | ICD-10-CM | POA: Diagnosis not present

## 2018-01-29 DIAGNOSIS — K635 Polyp of colon: Secondary | ICD-10-CM | POA: Diagnosis not present

## 2018-01-29 DIAGNOSIS — D123 Benign neoplasm of transverse colon: Secondary | ICD-10-CM | POA: Diagnosis not present

## 2018-01-29 DIAGNOSIS — Z1211 Encounter for screening for malignant neoplasm of colon: Secondary | ICD-10-CM | POA: Diagnosis not present

## 2018-02-04 ENCOUNTER — Encounter: Payer: 59 | Admitting: Physical Medicine & Rehabilitation

## 2018-02-05 ENCOUNTER — Encounter: Payer: 59 | Attending: Physical Medicine and Rehabilitation | Admitting: Registered Nurse

## 2018-02-05 ENCOUNTER — Other Ambulatory Visit: Payer: Self-pay

## 2018-02-05 ENCOUNTER — Encounter: Payer: Self-pay | Admitting: Registered Nurse

## 2018-02-05 VITALS — BP 139/83 | HR 103 | Ht 65.0 in | Wt 148.0 lb

## 2018-02-05 DIAGNOSIS — M4716 Other spondylosis with myelopathy, lumbar region: Secondary | ICD-10-CM | POA: Diagnosis present

## 2018-02-05 DIAGNOSIS — G8929 Other chronic pain: Secondary | ICD-10-CM

## 2018-02-05 DIAGNOSIS — G894 Chronic pain syndrome: Secondary | ICD-10-CM | POA: Insufficient documentation

## 2018-02-05 DIAGNOSIS — Z5181 Encounter for therapeutic drug level monitoring: Secondary | ICD-10-CM | POA: Diagnosis not present

## 2018-02-05 DIAGNOSIS — M542 Cervicalgia: Secondary | ICD-10-CM

## 2018-02-05 DIAGNOSIS — M5136 Other intervertebral disc degeneration, lumbar region: Secondary | ICD-10-CM | POA: Insufficient documentation

## 2018-02-05 DIAGNOSIS — M797 Fibromyalgia: Secondary | ICD-10-CM | POA: Diagnosis present

## 2018-02-05 DIAGNOSIS — M47812 Spondylosis without myelopathy or radiculopathy, cervical region: Secondary | ICD-10-CM | POA: Diagnosis not present

## 2018-02-05 DIAGNOSIS — M7918 Myalgia, other site: Secondary | ICD-10-CM | POA: Diagnosis not present

## 2018-02-05 DIAGNOSIS — M5412 Radiculopathy, cervical region: Secondary | ICD-10-CM

## 2018-02-05 DIAGNOSIS — Z79899 Other long term (current) drug therapy: Secondary | ICD-10-CM | POA: Insufficient documentation

## 2018-02-05 DIAGNOSIS — G43519 Persistent migraine aura without cerebral infarction, intractable, without status migrainosus: Secondary | ICD-10-CM | POA: Diagnosis present

## 2018-02-05 DIAGNOSIS — F0781 Postconcussional syndrome: Secondary | ICD-10-CM | POA: Insufficient documentation

## 2018-02-05 DIAGNOSIS — M546 Pain in thoracic spine: Secondary | ICD-10-CM

## 2018-02-05 MED ORDER — FENTANYL 25 MCG/HR TD PT72: 25.0000 ug | MEDICATED_PATCH | TRANSDERMAL | 0 refills | Status: DC

## 2018-02-05 MED ORDER — HYDROCODONE-ACETAMINOPHEN 5-325 MG PO TABS: 1.0000 | ORAL_TABLET | Freq: Three times a day (TID) | ORAL | 0 refills | Status: DC | PRN

## 2018-02-05 NOTE — Progress Notes (Signed)
Subjective:    Patient ID: Patty Salas, female    DOB: 10-03-1955, 62 y.o.   MRN: [id number]  HPI: Patty Salas is a 62 year old female who returns for follow up appointment for chronic pain and medication refill. She states her pain is located in her neck radiating into her left shoulder and mid-lower back. She rates her pain 10. Her current exercise regime is walking and performing stretching exercises.  Patty Salas reports on 01/31/2018 around 5:00 am she was walking in her bedroom and lost footing and landed on her left side. She has ecchymosis noted on her neck and left shoulder it is resolving. Patty Salas was instructed to use her cane at all times and make sure she has adequate lights. Also educated on falls prevention she verbalizes understanding.   Patty Salas Morphine Equivalent is 72.50 MME. She is also prescribed Lorazepam by Dr. Reece Levy. We have discussed the black box warning of using opioids and benzodiazepines. I highlighted the dangers of using these drugs together and discussed the adverse events including respiratory suppression, overdose, cognitive impairment and importance of compliance with current regimen. We will continue to monitor and adjust as indicated.   She is being closely monitored and under the care of her psychiatrist Dr. Reece Levy.  .  Last UDS was Performed on 11/07/2017, it was consistent.   Pain Inventory Average Pain 10 Pain Right Now 10 My pain is constant, sharp, burning, stabbing and aching  In the last 24 hours, has pain interfered with the following? General activity 10 Relation with others 10 Enjoyment of life 10 What TIME of day is your pain at its worst? all Sleep (in general) Fair  Pain is worse with: walking, bending, sitting, inactivity, standing and some activites Pain improves with: rest, heat/ice, therapy/exercise, medication and injections Relief from Meds: 5  Mobility walk without assistance walk with assistance use a cane how  many minutes can you walk? 4 ability to climb steps?  yes do you drive?  yes needs help with transfers transfers alone  Function I need assistance with the following:  meal prep, household duties and shopping  Neuro/Psych bladder control problems bowel control problems weakness numbness tremor tingling trouble walking spasms dizziness confusion depression anxiety  Prior Studies bone scan x-rays CT/MRI nerve study  Physicians involved in your care Any changes since last visit?  yes Primary care n/a Neurologist n/a Psychiatrist n/a   Family History  Problem Relation Age of Onset  . Cancer Mother        stomach  . Migraines Mother   . Arthritis Mother   . Emphysema Mother   . Heart disease Father   . Hypertension Father   . Cancer Father        colon  . Dementia Father   . Hypertension Brother   . Migraines Brother   . Hypertension Brother   . Migraines Brother   . Alcohol abuse Brother   . Bipolar disorder Brother   . Migraines Daughter   . Arthritis Maternal Grandmother   . Cancer Maternal Grandmother        stomach  . Diabetes Maternal Grandmother   . Stroke Maternal Grandmother   . Cancer Maternal Aunt        lung  . Emphysema Maternal Aunt   . Cancer Paternal Uncle        colon  . Hypertension Maternal Aunt   . Heart disease Maternal Aunt   . Cancer Paternal Uncle  lung   Social History   Socioeconomic History  . Marital status: Married    Spouse name: Not on file  . Number of children: 1  . Years of education: COLLEGE1  . Highest education level: Not on file  Occupational History  . Occupation: HOUSEWIFE    Employer: UNEMPLOYED  Social Needs  . Financial resource strain: Not on file  . Food insecurity:    Worry: Not on file    Inability: Not on file  . Transportation needs:    Medical: Not on file    Non-medical: Not on file  Tobacco Use  . Smoking status: Never Smoker  . Smokeless tobacco: Never Used  Substance and  Sexual Activity  . Alcohol use: No  . Drug use: No  . Sexual activity: Not on file  Lifestyle  . Physical activity:    Days per week: Not on file    Minutes per session: Not on file  . Stress: Not on file  Relationships  . Social connections:    Talks on phone: Not on file    Gets together: Not on file    Attends religious service: Not on file    Active member of club or organization: Not on file    Attends meetings of clubs or organizations: Not on file    Relationship status: Not on file  Other Topics Concern  . Not on file  Social History Narrative   Patient is right handed.   Patient drinks caffeine occasionally.   Past Surgical History:  Procedure Laterality Date  .  kidney stones    . ABDOMINAL ADHESION SURGERY    . ABDOMINAL HYSTERECTOMY  1995   partial- hemmoraged after surgery-stitch came loose  . APPENDECTOMY  1993   hemmoraged after surgery- stitch came loose  . CERVICAL DISC SURGERY  06/04/2001   with fusion  . CHOLECYSTECTOMY  1985  . colonoscopy    . COLONOSCOPY    . CYSTOSCOPY    . FOOT SURGERY     lt  . jj stent  07/2002   with ureteroscopy, cystoscopy and then removal of stent in office  . LITHOTRIPSY    . LYMPH NODE BIOPSY Right 03/06/2014   Procedure: right groin LYMPH NODE BIOPSY;  Surgeon: Merrie Roof, MD;  Location: [place];  Service: General;  Laterality: Right;  . LYMPHADENECTOMY N/A 12/29/2015   Procedure: RETROPERITONEAL LYMPHADENECTOMY;  Surgeon: Alexis Frock, MD;  Location: [facility];  Service: Urology;  Laterality: N/A;  . NECK SURGERY  2002  . ROBOT ASSISTED LAPAROSCOPIC NEPHRECTOMY Left 12/29/2015   Procedure: XI ROBOTIC ASSISTED LEFT LAPAROSCOPIC NEPHRECTOMY;  Surgeon: Alexis Frock, MD;  Location: [facility];  Service: Urology;  Laterality: Left;  . TONSILLECTOMY  1976   Past Medical History:  Diagnosis Date  . Anxiety   . Arthritis   . Asthma   . Bipolar affective ([place])   . Calcifying tendinitis of shoulder   .  Carpal tunnel syndrome   . Cervical facet syndrome   . Chronic pain syndrome   . Complication of anesthesia   . Depression   . Diverticulosis   . Falls   . Fibromyalgia   . 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 Full dentures   . Gout   . Headache(784.0)    migraines  . Herpesviral infection   . Hypertension   . IBS (irritable bowel syndrome)    with diarrhea  . Lymphadenopathy   .  Myalgia and myositis, unspecified   . PONV (postoperative nausea and vomiting)   . PTSD (post-traumatic stress disorder)   . Renal calculi   . Restless legs syndrome (RLS)   . Thoracic radiculopathy   . Vitamin D deficiency   . Wears glasses    BP 139/83   Pulse (!) 103   Ht 5\' 5"  (1.651 m)   Wt 148 lb (67.1 kg)   SpO2 98%   BMI 24.63 kg/m   Opioid Risk Score:   Fall Risk Score:  `1  Depression screen PHQ 2/9  Depression screen [facility] 2/9 02/05/2018 10/05/2017 09/04/2017 08/02/2017 07/05/2017 04/24/2017 01/17/2017  Decreased Interest 1 1 1 1 3 2 2   Down, Depressed, Hopeless 1 1 1 1 3 2 2   PHQ - 2 Score 2 2 2 2 6 4 4   Altered sleeping - - 1 - - - -  Tired, decreased energy - - 2 - - - -  Change in appetite - - 2 - - - -  Feeling bad or failure about yourself  - - 2 - - - -  Trouble concentrating - - 1 - - - -  Moving slowly or fidgety/restless - - 1 - - - -  Suicidal thoughts - - 0 - - - -  PHQ-9 Score - - 11 - - - -  Difficult doing work/chores - - Somewhat difficult - - - -  Some recent data might be hidden   Review of Systems  Constitutional: Positive for fever and unexpected weight change.  HENT: Negative.   Respiratory: Negative.   Cardiovascular: Negative.   Gastrointestinal: Positive for abdominal pain.  Genitourinary: Negative.   Musculoskeletal: Negative.   Skin: Negative.   Allergic/Immunologic: Negative.   Neurological: Negative.   Hematological: Negative.   Psychiatric/Behavioral: Negative.   All other systems reviewed and are  negative.      Objective:   Physical Exam  Constitutional: She is oriented to person, place, and time. She appears well-developed and well-nourished.  HENT:  Head: Normocephalic and atraumatic.  Neck: Normal range of motion. Neck supple.  Cervical Paraspinal Tenderness: C-5-C-6 Ecchymosis resolving  Cardiovascular: Normal rate and regular rhythm.  Pulmonary/Chest: Effort normal and breath sounds normal.  Musculoskeletal:  Normal Muscle Bulk and Muscle Testing Reveals: Upper Extremities: Full ROM and Muscle Strength 5/5 Left AC Joint Tenderness Left Shoulder Ecchymosis Resolving  Thoracic Paraspinal Tenderness Noted T-1-T-3 Mainly Left Side Thoracic Paraspinal Tenderness: T-7-T-9 Lumbar Paraspinal Tenderness: L-3-L-5 Lower Extremities: Full ROM and Muscle Strength 5/5 Arises from Table with ease Narrow Based gait  Neurological: She is alert and oriented to person, place, and time.  Skin: Skin is warm and dry.  Psychiatric: She has a normal mood and affect.  Nursing note and vitals reviewed.         Assessment & Plan:  1. History of fibromyalgia with myofascial pain and multiple trigger points.Continue with Heat and exercise Regime. Continue with current medication regimen with  gabapentin. 02/05/2018 2. Chronic migraine headaches.Continue to Monitor. S/P Botox.on 05/30/2017 02/05/2018 3. Midline Low Back Pain/ Lumbar Spondylosis/Lumbar degenerative disk disease, L4-5 :Continue with HEP and current medication regimen.  Refilled: Fentanyl Patch 25 mcg one patch every three days #10 andHydrocodone 5/325 mg one tablet every 6 hours as needed #90. 02/04/2018 4. 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 Urology Following. 02/05/2018 5. 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 02/05/2018 6. Cervicalgia/ Cervical RadiculitisCervical Spondylosis: Continue current medication regiment with  Gabapentin. Continue with  HEP and Continue to monitor.  02/05/2018 7. Frequent Falls: Educated on Falls Prevention she will scheduled an appointment with her neurologist she reports. Neurology Following. Continue to Monitor 02/05/2018.  8. Muscle Spasm: Continue current medication regiment with Flexeril as needed. 02/05/2018  20 minutes of face to face patient care time was spent during this visit. All questions were encouraged and answered.  Follow up in 1 month

## 2018-03-02 ENCOUNTER — Other Ambulatory Visit: Payer: Self-pay | Admitting: Neurology

## 2018-03-06 ENCOUNTER — Encounter: Payer: 59 | Admitting: Registered Nurse

## 2018-03-06 NOTE — Progress Notes (Signed)
[facility]  PATIENT: Patty Salas DOB: [birth year]   REASON FOR VISIT: follow up for migraine HISTORY FROM:patient    HISTORY OF PRESENT ILLNESS:UPDATE 03/07/18 CM Patty Salas, 62 year old female returns for follow-up with history of chronic headache.  She is currently going to pain clinic and receives trigger point injections.  She is on Norco and fentanyl patch.  She takes Axert  acutely for her headaches.  Sometimes they are relieved by Phenergan 12.5 and lying down with a cold pack.  She is allergic to multiple medications.  She has failed Topamax and verapamil as migraine preventives, she had a reaction to Botox in the past.  She returns for reevaluation.   12/5/17[name]. Patty Salas is a 62 year old right-handed white female with a history of fibromyalgia and chronic daily headaches. 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 The patient has been underlying stress with recent deaths of her mother, daughter, and father. The patient takes Axert for migraines, she is followed by Dr. Tessa Lerner and gets trigger point injections which are helpful. The patient returns this office for an evaluation.   REVIEW OF SYSTEMS: Full 14 system review of systems performed and notable only for those listed, all others are neg:  Constitutional: Chills weight loss Cardiovascular: Normal leg swelling Ear/Nose/Throat: Hearing loss Skin: Dry skin Eyes: Blurred vision Respiratory: Wheezing Gastroitestinal: Irritable bowel syndrome Hematology/Lymphatic: Easy bruising Endocrine: neg Musculoskeletal: Chronic pain goes to pain clinic Allergy/Immunology: Environmental allergies Neurological: Headaches dizziness weakness Psychiatric: Depression anxiety bipolar Sleep : Restless legs   ALLERGIES: Allergies  Allergen Reactions  . Cymbalta [Duloxetine Hcl] Anaphylaxis    Suicidal if in combination w/ Axert  . Divalproex Sodium  Anaphylaxis  . Duract [Bromfenac] Anaphylaxis  . Hydrochlorothiazide Anaphylaxis    Pt loses facial movement uncontrolled;   . Imitrex [Sumatriptan Succinate] Anaphylaxis  . Latex Anaphylaxis  . Mellaril Anaphylaxis  . Olanzapine Anaphylaxis  . Penicillins Anaphylaxis  . Topamax Anaphylaxis and Other (See Comments)    Confusion, tremor, blurred vision  . Aripiprazole Other (See Comments)    Stiffened muscles  . Aspirin Hives and Itching  . Dalmane [Flurazepam Hcl] Other (See Comments)    Stiffened all muscles  . Darifenacin Hydrobromide Er Hives    Hives on back and looked like sunburn  . Metoclopramide Hcl Other (See Comments)    headache  . Seroquel [Quetiapine Fumerate] Other (See Comments)    extreme fatigue and bad dreams  . Statins Hives  . Stelazine Other (See Comments)    Stiffened muscles  . Thorazine [Chlorpromazine Hcl] Other (See Comments)    Stiffened muscles  . Abilify [Aripiprazole]     Stiffened muscles, extrapyramidal effects all over body.   . Alka-Seltzer [Aspirin Effervescent]     Mouth ulcers, swallowing problems.   . Allopurinol Hives  . Biofreeze [Menthol (Topical Analgesic)] Hives and Itching  . Capzasin [Capsaicin] Itching    Redness   . Colchicine Hives  . Cough Drops [Benzocaine]     orthicoat sprays containing menthol or lemon flavor--breaks inside of mouth out, red rash, mouth ulcers.   . Diflucan [Fluconazole] Hives and Itching  . Iohexol      Code: HIVES, Desc: pt broke out in red rash and hives after CT injection on 12/07/09.-pt needs 13 hr prep kit, Onset Date: [id number]   . Ivp Dye [Iodinated Diagnostic Agents]   . Lyrica [Pregabalin]   . Metamucil [Psyllium]     Mouth  ulcers, swallowing problems.  Merril Abbe [Polyethylene Glycol] Nausea And Vomiting  . Myrbetriq [Mirabegron]     swelling  . Nsaids     Mouth ulcers, swelling of mouth.   . Other Itching and Other (See Comments)    Dust, mold, feathers, wool, flannel, cologne, chlorox,  household cleaning products, scented candles, cinnamon, ivory soap, paints, sun sentitive, acidic fruits, ( lemons, limes, strawbery, tartrazine yellow#5 and 6 in foods, MSG food additives, sudiium lauryl sulates, hot peppers, spearmint, wintergreen peppermint, mentol, orange, jalapenos. ---headache, breathing, welps, canker sores inside mouth, uticaria  . Potassium-Containing Compounds     Undiluted through IV. --Pain.   . Prednisone     No  Sleep at night.   . Reglan [Metoclopramide]     Headaches.   . Renografin [Diatrizoate]     welps   . Risperdal [Risperidone] Other (See Comments)    Too sedating.   Marland Kitchen Seroquel [Quetiapine Fumarate]     Bad dreams, too sedating.  . Simvastatin Other (See Comments)    Paranoid affect, muscle spasms.   Marland Kitchen Urocit - K [Potassium Citrate]   . Zyprexa [Olanzapine]   . Avelox [Moxifloxacin Hcl In Nacl] Nausea And Vomiting  . Barium-Containing Compounds Rash  . Diclofenac Rash  . Doxycycline Nausea Only    Stomach upset.   Marland Kitchen E-Mycin [Erythromycin Base] Nausea Only  . Fiorinal [Butalbital-Aspirin-Caffeine] Itching and Rash    Welps,  . Flagyl [Metronidazole Hcl] Nausea And Vomiting  . Lamictal [Lamotrigine] Rash  . Pregabalin     Blurry vision, muscle stiffness   . Risperidone Other (See Comments)    Too sedating  . Toradol [Ketorolac Tromethamine] Itching and Rash    HOME MEDICATIONS: Outpatient Medications Prior to Visit  Medication Sig Dispense Refill  . acetaminophen (TYLENOL) 500 MG tablet Take 500 mg by mouth every 6 (six) hours as needed for moderate pain.    Marland Kitchen acyclovir (ZOVIRAX) 800 MG tablet Take 800 mg by mouth 3 (three) times daily as needed (sores on mouth.).     Marland Kitchen bisacodyl (DULCOLAX) 5 MG EC tablet Take 10 mg by mouth daily as needed for mild constipation.     . cyclobenzaprine (FLEXERIL) 10 MG tablet Take 1 tablet (10 mg total) by mouth 3 (three) times daily. 90 tablet 4  . docusate sodium (COLACE) 100 MG capsule Take 100 mg by  mouth daily as needed for mild constipation.     Marland Kitchen EPINEPHrine (EPIPEN 2-PAK) 0.3 mg/0.3 mL IJ SOAJ injection Inject 0.3 mLs (0.3 mg total) daily as needed into the skin (allergic reaction.). Reported on 12/06/2015 1 Device 0  . fentaNYL (DURAGESIC - DOSED MCG/HR) 25 MCG/HR patch Place 1 patch (25 mcg total) onto the skin every 3 (three) days. 10 patch 0  . gabapentin (NEURONTIN) 300 MG capsule Take 600 mg by mouth at bedtime.    Marland Kitchen HYDROcodone-acetaminophen (NORCO/VICODIN) 5-325 MG tablet Take 1 tablet by mouth every 8 (eight) hours as needed for moderate pain. 90 tablet 0  . hyoscyamine (LEVSIN, ANASPAZ) 0.125 MG tablet Take 0.125 mg by mouth every 4 (four) hours as needed for bladder spasms or cramping.     . lidocaine (XYLOCAINE) 2 % solution Use as directed 20 mLs in the mouth or throat daily as needed for mouth pain.     Marland Kitchen LORazepam (ATIVAN) 1 MG tablet Take 1 mg by mouth 3 (three) times daily.    . potassium chloride SA (K-DUR,KLOR-CON) 20 MEQ tablet Take 20 mEq by mouth 2 (  two) times daily.    Marland Kitchen PROAIR HFA 108 (90 BASE) MCG/ACT inhaler Inhale 2 puffs into the lungs every 4 (four) hours as needed for wheezing or shortness of breath.     . promethazine (PHENERGAN) 25 MG tablet Take 25 mg by mouth every 6 (six) hours as needed for nausea or vomiting.     . Trospium Chloride 60 MG CP24 Take 1 capsule by mouth every morning.     . Vitamin D, Ergocalciferol, (DRISDOL) 50000 UNITS CAPS capsule Take 50,000 Units by mouth every 7 (seven) days.     Marland Kitchen almotriptan (AXERT) 12.5 MG tablet TAKE 1 TABLET (12.5 MG TOTAL) BY MOUTH AS NEEDED FOR MIGRAINE (MAX 2 TABS PER WEEK). (Patient not taking: Reported on 03/07/2018) 24 tablet 1   No facility-administered medications prior to visit.     PAST MEDICAL HISTORY: Past Medical History:  Diagnosis Date  . Anxiety   . Arthritis   . Asthma   . Bipolar affective ([place])   . Calcifying tendinitis of shoulder   . Carpal tunnel syndrome   . Cervical facet syndrome     . Chronic pain syndrome   . Complication of anesthesia   . Depression   . Diverticulosis   . Falls   . Fibromyalgia   . 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 Full dentures   . Gout   . Headache(784.0)    migraines  . Herpesviral infection   . Hypertension   . IBS (irritable bowel syndrome)    with diarrhea  . Lymphadenopathy   . Myalgia and myositis, unspecified   . PONV (postoperative nausea and vomiting)   . PTSD (post-traumatic stress disorder)   . Renal calculi   . Restless legs syndrome (RLS)   . Thoracic radiculopathy   . Vitamin D deficiency   . Wears glasses     PAST SURGICAL HISTORY: Past Surgical History:  Procedure Laterality Date  .  kidney stones    . ABDOMINAL ADHESION SURGERY    . ABDOMINAL HYSTERECTOMY  1995   partial- hemmoraged after surgery-stitch came loose  . APPENDECTOMY  1993   hemmoraged after surgery- stitch came loose  . CERVICAL DISC SURGERY  06/04/2001   with fusion  . CHOLECYSTECTOMY  1985  . colonoscopy    . COLONOSCOPY    . CYSTOSCOPY    . FOOT SURGERY     lt  . jj stent  07/2002   with ureteroscopy, cystoscopy and then removal of stent in office  . LITHOTRIPSY    . LYMPH NODE BIOPSY Right 03/06/2014   Procedure: right groin LYMPH NODE BIOPSY;  Surgeon: Merrie Roof, MD;  Location: [facility];  Service: General;  Laterality: Right;  . LYMPHADENECTOMY N/A 12/29/2015   Procedure: RETROPERITONEAL LYMPHADENECTOMY;  Surgeon: Alexis Frock, MD;  Location: [facility];  Service: Urology;  Laterality: N/A;  . NECK SURGERY  2002  . ROBOT ASSISTED LAPAROSCOPIC NEPHRECTOMY Left 12/29/2015   Procedure: XI ROBOTIC ASSISTED LEFT LAPAROSCOPIC NEPHRECTOMY;  Surgeon: Alexis Frock, MD;  Location: [facility];  Service: Urology;  Laterality: Left;  . TONSILLECTOMY  1976    FAMILY HISTORY: Family History  Problem Relation Age of Onset  . Cancer Mother        stomach  . Migraines Mother   . Arthritis  Mother   . Emphysema Mother   . Heart disease Father   . Hypertension Father   . Cancer Father  colon  . Dementia Father   . Hypertension Brother   . Migraines Brother   . Hypertension Brother   . Migraines Brother   . Alcohol abuse Brother   . Bipolar disorder Brother   . Migraines Daughter   . Arthritis Maternal Grandmother   . Cancer Maternal Grandmother        stomach  . Diabetes Maternal Grandmother   . Stroke Maternal Grandmother   . Cancer Maternal Aunt        lung  . Emphysema Maternal Aunt   . Cancer Paternal Uncle        colon  . Hypertension Maternal Aunt   . Heart disease Maternal Aunt   . Cancer Paternal Uncle        lung    SOCIAL HISTORY: Social History   Socioeconomic History  . Marital status: Married    Spouse name: Not on file  . Number of children: 1  . Years of education: COLLEGE1  . Highest education level: Not on file  Occupational History  . Occupation: HOUSEWIFE    Employer: UNEMPLOYED  Social Needs  . Financial resource strain: Not on file  . Food insecurity:    Worry: Not on file    Inability: Not on file  . Transportation needs:    Medical: Not on file    Non-medical: Not on file  Tobacco Use  . Smoking status: Never Smoker  . Smokeless tobacco: Never Used  Substance and Sexual Activity  . Alcohol use: No  . Drug use: No  . Sexual activity: Not on file  Lifestyle  . Physical activity:    Days per week: Not on file    Minutes per session: Not on file  . Stress: Not on file  Relationships  . Social connections:    Talks on phone: Not on file    Gets together: Not on file    Attends religious service: Not on file    Active member of club or organization: Not on file    Attends meetings of clubs or organizations: Not on file    Relationship status: Not on file  . Intimate partner violence:    Fear of current or ex partner: Not on file    Emotionally abused: Not on file    Physically abused: Not on file    Forced  sexual activity: Not on file  Other Topics Concern  . Not on file  Social History Narrative   Patient is right handed.   Patient drinks caffeine occasionally.     PHYSICAL EXAM  Vitals:   03/07/18 1252  BP: (!) 142/75  Pulse: (!) 103  Weight: 147 lb 9.6 oz (67 kg)  Height: _0  (1.651 m)   Body mass index is 24.56 kg/m.  Generalized: Well developed, in no acute distress  Head: normocephalic and atraumatic,. Oropharynx benign  Neck: Supple,  Musculoskeletal: No deformity   Neurological examination   Mentation: Alert oriented to time, place, history taking. Attention span and concentration appropriate. Recent and remote memory intact.  Follows all commands speech and language fluent.   Cranial nerve II-XII: Pupils were equal round reactive to light extraocular movements were full, visual field were full on confrontational test. Facial sensation and strength were normal. hearing was intact to finger rubbing bilaterally. Uvula tongue midline. head turning and shoulder shrug were normal and symmetric.Tongue protrusion into cheek strength was normal. Motor: normal bulk and tone, full strength in the BUE, BLE, Sensory: normal and symmetric to  light touch, pinprick, and  Vibration, in the upper and lower extremities Coordination: finger-nose-finger, heel-to-shin bilaterally, no dysmetria Reflexes: Brachioradialis 2/2, biceps 2/2, triceps 2/2, patellar 2/2, Achilles 2/2, plantar responses were flexor bilaterally. Gait and Station: Rising up from seated position without assistance, wide-based stance,  moderate stride, ambulates with a quad cane, no difficulty with turns  DIAGNOSTIC DATA (LABS, IMAGING, TESTING) - I reviewed patient records, labs, notes, testing and imaging myself where available.  Lab Results  Component Value Date   WBC 6.1 08/28/2017   HGB 12.0 08/28/2017   HCT 37.1 08/28/2017   MCV 94 08/28/2017   PLT 350 08/28/2017      Component Value Date/Time   NA 143  08/28/2017 1114   NA 140 12/01/2014 1039   K 4.2 08/28/2017 1114   K 2.8 (LL) 12/01/2014 1039   CL 98 08/28/2017 1114   CO2 29 08/28/2017 1114   CO2 30 (H) 12/01/2014 1039   GLUCOSE 102 (H) 08/28/2017 1114   GLUCOSE 118 (H) 12/30/2015 0444   GLUCOSE 143 (H) 12/01/2014 1039   BUN 13 08/28/2017 1114   BUN 14.3 12/01/2014 1039   CREATININE 0.88 08/28/2017 1114   CREATININE 0.8 12/01/2014 1039   CALCIUM 10.1 08/28/2017 1114   CALCIUM 10.0 12/01/2014 1039   PROT 8.2 08/28/2017 1114   PROT 8.4 (H) 12/01/2014 1039   ALBUMIN 4.8 08/28/2017 1114   ALBUMIN 4.1 12/01/2014 1039   AST 29 08/28/2017 1114   AST 23 12/01/2014 1039   ALT 15 08/28/2017 1114   ALT 14 12/01/2014 1039   ALKPHOS 89 08/28/2017 1114   ALKPHOS 71 12/01/2014 1039   BILITOT 0.4 08/28/2017 1114   BILITOT 0.22 12/01/2014 1039   GFRNONAA 71 08/28/2017 1114   GFRAA 81 08/28/2017 1114   Lab Results  Component Value Date   CHOL 250 (H) 08/28/2017   HDL 67 08/28/2017   LDLCALC 159 (H) 08/28/2017   TRIG 120 08/28/2017   CHOLHDL 3.7 08/28/2017   Lab Results  Component Value Date   HGBA1C 5.7 (H) 08/28/2017   Lab Results  Component Value Date   VITAMINB12 299 08/28/2017   Lab Results  Component Value Date   TSH 1.460 08/28/2017      ASSESSMENT AND PLAN  62 y.o. year old female here to follow-up for chronic  headache  Continue Axert will refill Has had multiple allergies to meds Follow up yearly and prn  Dennie Bible, [facility], [facility], APRN  [facility] [address], [place], [place] [address] [phone]

## 2018-03-07 ENCOUNTER — Encounter

## 2018-03-07 ENCOUNTER — Ambulatory Visit: Payer: 59 | Admitting: Nurse Practitioner

## 2018-03-07 ENCOUNTER — Encounter: Payer: Self-pay | Admitting: Nurse Practitioner

## 2018-03-07 VITALS — BP 142/75 | HR 103 | Ht 65.0 in | Wt 147.6 lb

## 2018-03-07 DIAGNOSIS — G43519 Persistent migraine aura without cerebral infarction, intractable, without status migrainosus: Secondary | ICD-10-CM

## 2018-03-07 MED ORDER — ALMOTRIPTAN MALATE 12.5 MG PO TABS: ORAL_TABLET | ORAL | 3 refills | Status: DC

## 2018-03-07 NOTE — Progress Notes (Signed)
I have read the note, and I agree with the clinical assessment and plan.  Patty Salas   

## 2018-03-07 NOTE — Patient Instructions (Signed)
Continue Axert will refill Has had multiple allergies to meds Follow up yearly and prn

## 2018-03-08 ENCOUNTER — Encounter: Payer: Self-pay | Admitting: Registered Nurse

## 2018-03-08 ENCOUNTER — Encounter: Payer: 59 | Attending: Physical Medicine and Rehabilitation | Admitting: Registered Nurse

## 2018-03-08 VITALS — BP 134/74 | HR 102 | Ht 65.0 in | Wt 147.0 lb

## 2018-03-08 DIAGNOSIS — M5412 Radiculopathy, cervical region: Secondary | ICD-10-CM

## 2018-03-08 DIAGNOSIS — F0781 Postconcussional syndrome: Secondary | ICD-10-CM | POA: Insufficient documentation

## 2018-03-08 DIAGNOSIS — M546 Pain in thoracic spine: Secondary | ICD-10-CM | POA: Diagnosis not present

## 2018-03-08 DIAGNOSIS — M542 Cervicalgia: Secondary | ICD-10-CM | POA: Diagnosis not present

## 2018-03-08 DIAGNOSIS — G43519 Persistent migraine aura without cerebral infarction, intractable, without status migrainosus: Secondary | ICD-10-CM | POA: Diagnosis present

## 2018-03-08 DIAGNOSIS — Z5181 Encounter for therapeutic drug level monitoring: Secondary | ICD-10-CM | POA: Insufficient documentation

## 2018-03-08 DIAGNOSIS — G8929 Other chronic pain: Secondary | ICD-10-CM

## 2018-03-08 DIAGNOSIS — G894 Chronic pain syndrome: Secondary | ICD-10-CM | POA: Diagnosis not present

## 2018-03-08 DIAGNOSIS — M47812 Spondylosis without myelopathy or radiculopathy, cervical region: Secondary | ICD-10-CM | POA: Diagnosis not present

## 2018-03-08 DIAGNOSIS — M5136 Other intervertebral disc degeneration, lumbar region: Secondary | ICD-10-CM | POA: Diagnosis present

## 2018-03-08 DIAGNOSIS — M797 Fibromyalgia: Secondary | ICD-10-CM | POA: Insufficient documentation

## 2018-03-08 DIAGNOSIS — M4716 Other spondylosis with myelopathy, lumbar region: Secondary | ICD-10-CM

## 2018-03-08 DIAGNOSIS — Z79899 Other long term (current) drug therapy: Secondary | ICD-10-CM | POA: Diagnosis not present

## 2018-03-08 MED ORDER — CYCLOBENZAPRINE HCL 10 MG PO TABS: 10.0000 mg | ORAL_TABLET | Freq: Three times a day (TID) | ORAL | 4 refills | Status: DC

## 2018-03-08 MED ORDER — HYDROCODONE-ACETAMINOPHEN 5-325 MG PO TABS: 1.0000 | ORAL_TABLET | Freq: Three times a day (TID) | ORAL | 0 refills | Status: DC | PRN

## 2018-03-08 MED ORDER — FENTANYL 25 MCG/HR TD PT72: 25.0000 ug | MEDICATED_PATCH | TRANSDERMAL | 0 refills | Status: DC

## 2018-03-08 NOTE — Progress Notes (Signed)
Subjective:    Patient ID: Patty Salas, female    DOB: 1955-12-10, 62 y.o.   MRN: [id number]  HPI: [name] is a 62 year old female who returns for follow up appointment for chronic pain and medication refill. She states her pain is located in her neck radiating into her bilateral shoulders and mid-lower back pain. She  rates her pain 10. Her current exercise regime is walking and performing stretching exercises.   [name] Morphine Equivalent is 75.00 MME. She is also prescribed Lorazapam by Dr.Reedy .We have discussed the black box warning of using opioids and benzodiazepines. I highlighted the dangers of using these drugs together and discussed the adverse events including respiratory suppression, overdose, cognitive impairment and importance of compliance with current regimen. We will continue to monitor and adjust as indicated. She is being closely monitored and under the care of her psychiatrist Dr. Fay Records.  Pain Inventory Average Pain 10 Pain Right Now 10 My pain is constant, burning and stabbing  In the last 24 hours, has pain interfered with the following? General activity 10 Relation with others 10 Enjoyment of life 10 What TIME of day is your pain at its worst? 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 Cancer Mother        stomach  . Migraines Mother   . Arthritis Mother   .  Emphysema Mother   . Heart disease Father   . Hypertension Father   . Cancer Father        colon  . Dementia Father   . Hypertension Brother   . Migraines Brother   . Hypertension Brother   . Migraines Brother   . Alcohol abuse Brother   . Bipolar disorder Brother   . Migraines Daughter   . Arthritis Maternal Grandmother   . Cancer Maternal Grandmother        stomach  . Diabetes Maternal Grandmother   . Stroke Maternal Grandmother   . Cancer Maternal Aunt        lung  . Emphysema Maternal Aunt   . Cancer Paternal Uncle        colon  . Hypertension Maternal Aunt   . Heart disease Maternal Aunt   . Cancer Paternal Uncle        lung   Social History   Socioeconomic History  . Marital status: Married    Spouse name: Not on file  . Number of children: 1  . Years of education: COLLEGE1  . Highest education level: Not on file  Occupational History  . Occupation: HOUSEWIFE    Employer: UNEMPLOYED  Social Needs  . Financial resource strain: Not on file  . Food insecurity:    Worry: Not on file    Inability: Not on file  . Transportation needs:    Medical: Not on file    Non-medical: Not on file  Tobacco Use  . Smoking status: Never Smoker  . Smokeless tobacco: Never Used  Substance and Sexual Activity  .  Alcohol use: No  . Drug use: No  . Sexual activity: Not on file  Lifestyle  . Physical activity:    Days per week: Not on file    Minutes per session: Not on file  . Stress: Not on file  Relationships  . Social connections:    Talks on phone: Not on file    Gets together: Not on file    Attends religious service: Not on file    Active member of club or organization: Not on file    Attends meetings of clubs or organizations: Not on file    Relationship status: Not on file  Other Topics Concern  . Not on file  Social History Narrative   Patient is right handed.   Patient drinks caffeine occasionally.   Past Surgical History:  Procedure Laterality Date    .  kidney stones    . ABDOMINAL ADHESION SURGERY    . ABDOMINAL HYSTERECTOMY  1995   partial- hemmoraged after surgery-stitch came loose  . APPENDECTOMY  1993   hemmoraged after surgery- stitch came loose  . CERVICAL DISC SURGERY  06/04/2001   with fusion  . CHOLECYSTECTOMY  1985  . colonoscopy    . COLONOSCOPY    . CYSTOSCOPY    . FOOT SURGERY     lt  . jj stent  07/2002   with ureteroscopy, cystoscopy and then removal of stent in office  . LITHOTRIPSY    . LYMPH NODE BIOPSY Right 03/06/2014   Procedure: right groin LYMPH NODE BIOPSY;  Surgeon: Merrie Roof, MD;  Location: [place];  Service: General;  Laterality: Right;  . LYMPHADENECTOMY N/A 12/29/2015   Procedure: RETROPERITONEAL LYMPHADENECTOMY;  Surgeon: Alexis Frock, MD;  Location: [facility];  Service: Urology;  Laterality: N/A;  . NECK SURGERY  2002  . ROBOT ASSISTED LAPAROSCOPIC NEPHRECTOMY Left 12/29/2015   Procedure: XI ROBOTIC ASSISTED LEFT LAPAROSCOPIC NEPHRECTOMY;  Surgeon: Alexis Frock, MD;  Location: [facility];  Service: Urology;  Laterality: Left;  . TONSILLECTOMY  1976   Past Medical History:  Diagnosis Date  . Anxiety   . Arthritis   . Asthma   . Bipolar affective ([place])   . Calcifying tendinitis of shoulder   . Carpal tunnel syndrome   . Cervical facet syndrome   . Chronic pain syndrome   . Complication of anesthesia   . Depression   . Diverticulosis   . Falls   . Fibromyalgia   . 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 Full dentures   . Gout   . Headache(784.0)    migraines  . Herpesviral infection   . Hypertension   . IBS (irritable bowel syndrome)    with diarrhea  . Lymphadenopathy   . Myalgia and myositis, unspecified   . PONV (postoperative nausea and vomiting)   . PTSD (post-traumatic stress disorder)   . Renal calculi   . Restless legs syndrome (RLS)   . Thoracic radiculopathy   . Vitamin D deficiency   . Wears glasses    BP 134/74    Pulse (!) 102   Ht 5\' 5"  (1.651 m)   Wt 147 lb (66.7 kg)   SpO2 95%   BMI 24.46 kg/m   Opioid Risk Score:   Fall Risk Score:  `1  Depression screen PHQ 2/9  Depression screen [facility] 2/9 02/05/2018 10/05/2017 09/04/2017 08/02/2017 07/05/2017 04/24/2017 01/17/2017  Decreased Interest 1 1 1 1 3 2 2   Down,  Depressed, Hopeless 1 1 1 1 3 2 2   PHQ - 2 Score 2 2 2 2 6 4 4   Altered sleeping - - 1 - - - -  Tired, decreased energy - - 2 - - - -  Change in appetite - - 2 - - - -  Feeling bad or failure about yourself  - - 2 - - - -  Trouble concentrating - - 1 - - - -  Moving slowly or fidgety/restless - - 1 - - - -  Suicidal thoughts - - 0 - - - -  PHQ-9 Score - - 11 - - - -  Difficult doing work/chores - - Somewhat difficult - - - -  Some recent data might be hidden     Review of Systems  Constitutional: Positive for activity change, appetite change and fever.  Respiratory: Positive for wheezing.   Gastrointestinal: Positive for abdominal pain, constipation, diarrhea, nausea and vomiting.  All other systems reviewed and are negative.      Objective:   Physical Exam  Constitutional: She is oriented to person, place, and time. She appears well-developed and well-nourished.  HENT:  Head: Normocephalic and atraumatic.  Neck: Normal range of motion. Neck supple.  Cervical Paraspinal Tenderness: C-5-C-6  Cardiovascular: Normal rate and regular rhythm.  Pulmonary/Chest: Effort normal and breath sounds normal.  Musculoskeletal:  Normal Muscle Bulk and Muscle Testing Reveals: Upper Extremities: Full ROM and Muscle Strength 5/5 Bilateral AC Joint Tenderness Thoracic and Lumbar Hypersensitivity Lower Extremities: Full ROM and Muscle Strength 5/5 Bilateral Lower Extremities Flexion Produces Pain into Lower Extremities.  Arises from Table slowly using cane for support Narrow Based Gait  Neurological: She is alert and oriented to person, place, and time.  Skin: Skin is warm and dry.   Psychiatric: She has a normal mood and affect.  Nursing note and vitals reviewed.         Assessment & Plan:  1. History of fibromyalgia with myofascial pain and multiple trigger points.Continue with Heat and exercise Regime. Continue withcurrent medication regimen withgabapentin. 03/08/2018 2. Chronic migraine headaches.Continue to Monitor. S/P Botox.on 05/30/2017 03/08/2018 3. Midline Low Back Pain/ Lumbar Spondylosis/Lumbar degenerative disk disease, L4-5 :Continue with HEP and current medication regimen. Refilled: Fentanyl Patch 25 mcg one patch every three days #10 andHydrocodone 5/325 mg one tablet every 6 hours as needed #90.Second script of Hydrocodone e-scribe to accommodate scheduled appointment.  03/08/2018 4. 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 Urology Following. 03/08/2018 5. 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 03/08/2018 6. Cervicalgia/ Cervical RadiculitisCervical Spondylosis: Continuecurrent medication regiment withGabapentin. Continue with HEP and Continue to monitor. 03/08/2018 7. Muscle Spasm: Continuecurrent medication regiment withFlexeril as needed. 03/08/2018  20 minutes of face to face patient care time was spent during this visit. All questions were encouraged and answered.  Follow up in 1 month

## 2018-03-29 ENCOUNTER — Encounter: Payer: Self-pay | Admitting: Podiatry

## 2018-03-29 ENCOUNTER — Other Ambulatory Visit: Payer: Self-pay | Admitting: Podiatry

## 2018-03-29 ENCOUNTER — Ambulatory Visit (INDEPENDENT_AMBULATORY_CARE_PROVIDER_SITE_OTHER): Payer: 59

## 2018-03-29 ENCOUNTER — Ambulatory Visit: Payer: 59 | Admitting: Podiatry

## 2018-03-29 ENCOUNTER — Encounter

## 2018-03-29 DIAGNOSIS — M779 Enthesopathy, unspecified: Secondary | ICD-10-CM

## 2018-03-29 DIAGNOSIS — M79671 Pain in right foot: Secondary | ICD-10-CM

## 2018-03-29 DIAGNOSIS — L600 Ingrowing nail: Secondary | ICD-10-CM | POA: Diagnosis not present

## 2018-03-29 DIAGNOSIS — M79672 Pain in left foot: Principal | ICD-10-CM

## 2018-03-29 MED ORDER — TRIAMCINOLONE ACETONIDE 10 MG/ML IJ SUSP
10.0000 mg | Freq: Once | INTRAMUSCULAR | Status: AC
Start: 2018-03-29 — End: 2018-03-29
  Administered 2018-03-29: 10 mg

## 2018-03-29 NOTE — Progress Notes (Signed)
Subjective:   Patient ID: Patty Salas, female   DOB: 62 y.o.   MRN: [id number]   HPI Patient presents with chronic pain around the second metatarsal phalangeal joint of both feet and does not remember specific injury and also concerned about a thickened painful second nail left that is hard for her to cut   ROS      Objective:  Physical Exam  Neurovascular status is unchanged with patient found to have inflammatory complex second MPJ bilateral with fluid buildup around the joint surface and pain with palpation     Assessment:  Acute inflammatory capsulitis second MPJ bilateral with nail disease and thickness of the second nail left foot     Plan:  H&P both conditions reviewed x-rays reviewed and today careful injection administered second MPJ after questioning her on allergies.  States she does not have any problem with Kenalog Xylocaine and Marcaine which is what I used in each joint half cc and she tolerated the procedure well and left in good condition.  The nail will not be addressed at this time but Nate may require removal at one point and I educated her on the procedure which may be necessary to exclude this.  X-rays indicate that there is no indications of arthritis or other pathology and this appears to be soft tissue and does not appear to be a bony procedure or other pathology currently conveyed to patient

## 2018-04-01 ENCOUNTER — Telehealth: Payer: Self-pay | Admitting: Physical Medicine & Rehabilitation

## 2018-04-01 NOTE — Telephone Encounter (Signed)
Patient saw Dr. Paulla Dolly last week and had some injections in her feet.  She wants to make sure this won't interfere with her Trigger point injections with Dr. Naaman Plummer.  If someone would please call her back.

## 2018-04-08 NOTE — Telephone Encounter (Signed)
Patient notified

## 2018-04-08 NOTE — Telephone Encounter (Signed)
That should not be a problem

## 2018-04-10 ENCOUNTER — Encounter: Payer: Self-pay | Admitting: Physical Medicine & Rehabilitation

## 2018-04-10 ENCOUNTER — Encounter: Payer: 59 | Attending: Physical Medicine and Rehabilitation | Admitting: Physical Medicine & Rehabilitation

## 2018-04-10 DIAGNOSIS — M7918 Myalgia, other site: Secondary | ICD-10-CM

## 2018-04-10 DIAGNOSIS — M4716 Other spondylosis with myelopathy, lumbar region: Secondary | ICD-10-CM | POA: Insufficient documentation

## 2018-04-10 DIAGNOSIS — M5136 Other intervertebral disc degeneration, lumbar region: Secondary | ICD-10-CM | POA: Insufficient documentation

## 2018-04-10 DIAGNOSIS — F0781 Postconcussional syndrome: Secondary | ICD-10-CM | POA: Insufficient documentation

## 2018-04-10 DIAGNOSIS — M47812 Spondylosis without myelopathy or radiculopathy, cervical region: Secondary | ICD-10-CM

## 2018-04-10 DIAGNOSIS — G894 Chronic pain syndrome: Secondary | ICD-10-CM | POA: Insufficient documentation

## 2018-04-10 DIAGNOSIS — G43519 Persistent migraine aura without cerebral infarction, intractable, without status migrainosus: Secondary | ICD-10-CM | POA: Diagnosis present

## 2018-04-10 DIAGNOSIS — Z79891 Long term (current) use of opiate analgesic: Secondary | ICD-10-CM | POA: Diagnosis not present

## 2018-04-10 DIAGNOSIS — Z79899 Other long term (current) drug therapy: Secondary | ICD-10-CM | POA: Insufficient documentation

## 2018-04-10 DIAGNOSIS — Z5181 Encounter for therapeutic drug level monitoring: Secondary | ICD-10-CM | POA: Diagnosis not present

## 2018-04-10 DIAGNOSIS — M797 Fibromyalgia: Secondary | ICD-10-CM | POA: Diagnosis present

## 2018-04-10 MED ORDER — FENTANYL 25 MCG/HR TD PT72: 25.0000 ug | MEDICATED_PATCH | TRANSDERMAL | 0 refills | Status: DC

## 2018-04-10 MED ORDER — HYDROCODONE-ACETAMINOPHEN 5-325 MG PO TABS: 1.0000 | ORAL_TABLET | Freq: Three times a day (TID) | ORAL | 0 refills | Status: DC | PRN

## 2018-04-10 NOTE — Patient Instructions (Signed)
PLEASE FEEL FREE TO CALL OUR OFFICE WITH ANY PROBLEMS OR QUESTIONS ([phone])      

## 2018-04-10 NOTE — Progress Notes (Signed)
Procedure Note  Trigger point injections  Dx: myofascial pain   After informed consent and preparation of the skin with isopropyl alcohol, I injected the the left trap, right trap, right and left lumbar paraspinals each with 2cc of 1% lidocaine (4 injections in total). The patient tolerated well, and no complications were experienced. Post-injection instructions were provided.   Refilled fentanyl patch as well as hydrocodone. Would like her to wean hydrocodone if possible.

## 2018-04-15 LAB — TOXASSURE SELECT,+ANTIDEPR,UR

## 2018-04-16 ENCOUNTER — Telehealth: Payer: Self-pay | Admitting: *Deleted

## 2018-04-16 NOTE — Telephone Encounter (Signed)
Urine drug screen for this encounter is consistent for prescribed medication 

## 2018-05-09 ENCOUNTER — Encounter: Payer: Self-pay | Admitting: Registered Nurse

## 2018-05-09 ENCOUNTER — Encounter: Payer: 59 | Attending: Physical Medicine and Rehabilitation | Admitting: Registered Nurse

## 2018-05-09 VITALS — BP 138/81 | HR 102 | Resp 14 | Ht 65.0 in | Wt 152.0 lb

## 2018-05-09 DIAGNOSIS — Z5181 Encounter for therapeutic drug level monitoring: Secondary | ICD-10-CM | POA: Insufficient documentation

## 2018-05-09 DIAGNOSIS — G43519 Persistent migraine aura without cerebral infarction, intractable, without status migrainosus: Secondary | ICD-10-CM | POA: Insufficient documentation

## 2018-05-09 DIAGNOSIS — M5136 Other intervertebral disc degeneration, lumbar region: Secondary | ICD-10-CM | POA: Diagnosis present

## 2018-05-09 DIAGNOSIS — M7918 Myalgia, other site: Secondary | ICD-10-CM

## 2018-05-09 DIAGNOSIS — F0781 Postconcussional syndrome: Secondary | ICD-10-CM | POA: Insufficient documentation

## 2018-05-09 DIAGNOSIS — M4716 Other spondylosis with myelopathy, lumbar region: Secondary | ICD-10-CM | POA: Insufficient documentation

## 2018-05-09 DIAGNOSIS — Z79891 Long term (current) use of opiate analgesic: Secondary | ICD-10-CM

## 2018-05-09 DIAGNOSIS — M5416 Radiculopathy, lumbar region: Secondary | ICD-10-CM

## 2018-05-09 DIAGNOSIS — G894 Chronic pain syndrome: Secondary | ICD-10-CM | POA: Diagnosis present

## 2018-05-09 DIAGNOSIS — Z79899 Other long term (current) drug therapy: Secondary | ICD-10-CM | POA: Diagnosis not present

## 2018-05-09 DIAGNOSIS — M797 Fibromyalgia: Secondary | ICD-10-CM | POA: Diagnosis present

## 2018-05-09 DIAGNOSIS — M542 Cervicalgia: Secondary | ICD-10-CM | POA: Diagnosis not present

## 2018-05-09 DIAGNOSIS — M546 Pain in thoracic spine: Secondary | ICD-10-CM

## 2018-05-09 DIAGNOSIS — M5412 Radiculopathy, cervical region: Secondary | ICD-10-CM | POA: Diagnosis not present

## 2018-05-09 DIAGNOSIS — G8929 Other chronic pain: Secondary | ICD-10-CM

## 2018-05-09 DIAGNOSIS — M47812 Spondylosis without myelopathy or radiculopathy, cervical region: Secondary | ICD-10-CM

## 2018-05-09 MED ORDER — FENTANYL 25 MCG/HR TD PT72
25.0000 ug | MEDICATED_PATCH | TRANSDERMAL | 0 refills | Status: DC
Start: 2018-05-09 — End: 2018-06-14

## 2018-05-09 MED ORDER — HYDROCODONE-ACETAMINOPHEN 5-325 MG PO TABS: 1.0000 | ORAL_TABLET | Freq: Three times a day (TID) | ORAL | 0 refills | Status: DC | PRN

## 2018-05-09 NOTE — Progress Notes (Signed)
Subjective:    Patient ID: Patty Salas, female    DOB: 01-31-1956, 62 y.o.   MRN: [id number]  HPI: Patty Salas is a 62 year old female who returns for follow up appointment for chronic pain and medication refill. She states her pain is located in her neck radiating into her bilateral shoulders and mid- lower back pain radiating into her bilateral lower extremities. She rates her pain 10. Her current exercise regime is walking and performing stretching exercises.   Patty Salas Morphine Equivalent is 75.00 MME. She is also prescribed Lorazepam  by Dr. Reece Levy .We have discussed the black box warning of using opioids and benzodiazepines. I highlighted the dangers of using these drugs together and discussed the adverse events including respiratory suppression, overdose, cognitive impairment and importance of compliance with current regimen. We will continue to monitor and adjust as indicated.  She is being closely monitored and under the care of her psychiatrist Dr. Reece Levy  Last UDS was Performed on 04/10/2018 it was consistent.   Pain Inventory Average Pain 9 Pain Right Now 10 My pain is constant, sharp, burning, dull, stabbing and aching  In the last 24 hours, has pain interfered with the following? General activity 9 Relation with others 8 Enjoyment of life 8 What TIME of day is your pain at its worst? all Sleep (in general) Fair  Pain is worse with: walking, bending, sitting, inactivity, standing and some activites Pain improves with: rest, heat/ice, therapy/exercise, medication and injections Relief from Meds: 5  Mobility walk without assistance walk with assistance use a cane use a walker how many minutes can you walk? 5-10 ability to climb steps?  yes do you drive?  yes needs help with transfers transfers alone Do you have any goals in this area?  yes  Function not employed: date last employed . disabled: date disabled . I need assistance with the following:  meal  prep, household duties and shopping Do you have any goals in this area?  yes  Neuro/Psych bladder control problems bowel control problems weakness numbness tremor tingling trouble walking spasms dizziness confusion depression anxiety  Prior Studies Any changes since last visit?  no  Physicians involved in your care Any changes since last visit?  no   Family History  Problem Relation Age of Onset  . Cancer Mother        stomach  . Migraines Mother   . Arthritis Mother   . Emphysema Mother   . Heart disease Father   . Hypertension Father   . Cancer Father        colon  . Dementia Father   . Hypertension Brother   . Migraines Brother   . Hypertension Brother   . Migraines Brother   . Alcohol abuse Brother   . Bipolar disorder Brother   . Migraines Daughter   . Arthritis Maternal Grandmother   . Cancer Maternal Grandmother        stomach  . Diabetes Maternal Grandmother   . Stroke Maternal Grandmother   . Cancer Maternal Aunt        lung  . Emphysema Maternal Aunt   . Cancer Paternal Uncle        colon  . Hypertension Maternal Aunt   . Heart disease Maternal Aunt   . Cancer Paternal Uncle        lung   Social History   Socioeconomic History  . Marital status: Married    Spouse name: Not on file  .  Number of children: 1  . Years of education: COLLEGE1  . Highest education level: Not on file  Occupational History  . Occupation: HOUSEWIFE    Employer: UNEMPLOYED  Social Needs  . Financial resource strain: Not on file  . Food insecurity:    Worry: Not on file    Inability: Not on file  . Transportation needs:    Medical: Not on file    Non-medical: Not on file  Tobacco Use  . Smoking status: Never Smoker  . Smokeless tobacco: Never Used  Substance and Sexual Activity  . Alcohol use: No  . Drug use: No  . Sexual activity: Not on file  Lifestyle  . Physical activity:    Days per week: Not on file    Minutes per session: Not on file  .  Stress: Not on file  Relationships  . Social connections:    Talks on phone: Not on file    Gets together: Not on file    Attends religious service: Not on file    Active member of club or organization: Not on file    Attends meetings of clubs or organizations: Not on file    Relationship status: Not on file  Other Topics Concern  . Not on file  Social History Narrative   Patient is right handed.   Patient drinks caffeine occasionally.   Past Surgical History:  Procedure Laterality Date  .  kidney stones    . ABDOMINAL ADHESION SURGERY    . ABDOMINAL HYSTERECTOMY  1995   partial- hemmoraged after surgery-stitch came loose  . APPENDECTOMY  1993   hemmoraged after surgery- stitch came loose  . CERVICAL DISC SURGERY  06/04/2001   with fusion  . CHOLECYSTECTOMY  1985  . colonoscopy    . COLONOSCOPY    . CYSTOSCOPY    . FOOT SURGERY     lt  . jj stent  07/2002   with ureteroscopy, cystoscopy and then removal of stent in office  . LITHOTRIPSY    . LYMPH NODE BIOPSY Right 03/06/2014   Procedure: right groin LYMPH NODE BIOPSY;  Surgeon: Merrie Roof, MD;  Location: [place];  Service: General;  Laterality: Right;  . LYMPHADENECTOMY N/A 12/29/2015   Procedure: RETROPERITONEAL LYMPHADENECTOMY;  Surgeon: Alexis Frock, MD;  Location: [facility];  Service: Urology;  Laterality: N/A;  . NECK SURGERY  2002  . ROBOT ASSISTED LAPAROSCOPIC NEPHRECTOMY Left 12/29/2015   Procedure: XI ROBOTIC ASSISTED LEFT LAPAROSCOPIC NEPHRECTOMY;  Surgeon: Alexis Frock, MD;  Location[facility];  Service: Urology;  Laterality: Left;  . TONSILLECTOMY  1976   Past Medical History:  Diagnosis Date  . Anxiety   . Arthritis   . Asthma   . Bipolar affective ([place])   . Calcifying tendinitis of shoulder   . Carpal tunnel syndrome   . Cervical facet syndrome   . Chronic pain syndrome   . Complication of anesthesia   . Depression   . Diverticulosis   . Falls   . Fibromyalgia   . 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 Full dentures   . Gout   . Headache(784.0)    migraines  . Herpesviral infection   . Hypertension   . IBS (irritable bowel syndrome)    with diarrhea  . Lymphadenopathy   . Myalgia and myositis, unspecified   . PONV (postoperative nausea and vomiting)   . PTSD (post-traumatic stress disorder)   . Renal  calculi   . Restless legs syndrome (RLS)   . Thoracic radiculopathy   . Vitamin D deficiency   . Wears glasses    BP 138/81 (BP Location: Right Arm, Patient Position: Sitting, Cuff Size: Normal)   Pulse (!) 102   Resp 14   Ht 5\' 5"  (1.651 m)   Wt 152 lb (68.9 kg)   SpO2 93%   BMI 25.29 kg/m   Opioid Risk Score:   Fall Risk Score:  `1  Depression screen PHQ 2/9  Depression screen [facility] 2/9 02/05/2018 10/05/2017 09/04/2017 08/02/2017 07/05/2017 04/24/2017 01/17/2017  Decreased Interest 1 1 1 1 3 2 2   Down, Depressed, Hopeless 1 1 1 1 3 2 2   PHQ - 2 Score 2 2 2 2 6 4 4   Altered sleeping - - 1 - - - -  Tired, decreased energy - - 2 - - - -  Change in appetite - - 2 - - - -  Feeling bad or failure about yourself  - - 2 - - - -  Trouble concentrating - - 1 - - - -  Moving slowly or fidgety/restless - - 1 - - - -  Suicidal thoughts - - 0 - - - -  PHQ-9 Score - - 11 - - - -  Difficult doing work/chores - - Somewhat difficult - - - -  Some recent data might be hidden    Review of Systems  Constitutional: Positive for diaphoresis and unexpected weight change.  HENT: Negative.   Eyes: Negative.   Respiratory: Positive for wheezing.   Cardiovascular: Positive for leg swelling.  Gastrointestinal: Positive for abdominal pain, constipation, diarrhea, nausea and vomiting.  Endocrine: Negative.   Genitourinary: Positive for difficulty urinating.  Musculoskeletal: Positive for arthralgias, back pain, gait problem, myalgias, neck pain and neck stiffness.  Allergic/Immunologic: Negative.   Neurological: Positive for dizziness,  tremors, weakness, numbness and headaches.       Tingling   Psychiatric/Behavioral: Positive for confusion and dysphoric mood. The patient is nervous/anxious.        Objective:   Physical Exam  Constitutional: She is oriented to person, place, and time. She appears well-developed and well-nourished.  HENT:  Head: Normocephalic and atraumatic.  Neck: Normal range of motion. Neck supple.  Cervical Paraspinal Tenderness: C-5-C-6  Musculoskeletal:  Normal Muscle Bulk and Muscle Testing Reveals: Upper Extremities: Full ROM and Muscle Strength 5/5  Bilateral AC Joint Tenderness  Thoracic Paraspinal Tenderness: T-1-T-3 Lumbar Paraspinal Tenderness: L-3-L-5 Lower Extremities: Decreased ROM and Muscle Strength 5/5 Arises from Table with ease Narrow Based Gait  Neurological: She is alert and oriented to person, place, and time.  Skin: Skin is warm and dry.  Psychiatric: She has a normal mood and affect. Her behavior is normal.  Nursing note and vitals reviewed.         Assessment & Plan:  1. History of fibromyalgia with myofascial pain and multiple trigger points.Continue with Heat and exercise Regime. Continue withcurrent medication regimen withgabapentin. 05/10/2018 2. Chronic migraine headaches.Continue to Monitor. S/P Botox.on 05/30/2017 05/10/2018 3. Midline Low Back Pain/ Lumbar Spondylosis/Lumbar degenerative disk disease, L4-5 :Continue with HEP and current medication regimen. Refilled: Fentanyl Patch 25 mcg one patch every three days #10 andHydrocodone 5/325 mg one tablet every 6 hours as needed #90.Second script of Hydrocodone e-scribe to accommodate scheduled appointment.  05/10/2018 4. 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 Urology Following. 05/10/2018 5. 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 05/10/2018 6. Cervicalgia/ Cervical  RadiculitisCervical Spondylosis: Continuecurrent medication regiment withGabapentin. Continue  with HEP and Continue to monitor. 05/10/2018 7. Muscle Spasm: Continuecurrent medication regiment withFlexeril as needed. 05/10/2018  20 minutes of face to face patient care time was spent during this visit. All questions were encouraged and answered.  Follow up in 1 month

## 2018-05-17 DIAGNOSIS — N200 Calculus of kidney: Secondary | ICD-10-CM | POA: Diagnosis not present

## 2018-05-17 DIAGNOSIS — C642 Malignant neoplasm of left kidney, except renal pelvis: Secondary | ICD-10-CM | POA: Diagnosis not present

## 2018-05-17 DIAGNOSIS — N3281 Overactive bladder: Secondary | ICD-10-CM | POA: Diagnosis not present

## 2018-05-29 ENCOUNTER — Ambulatory Visit: Payer: 59

## 2018-06-07 ENCOUNTER — Other Ambulatory Visit: Payer: Self-pay | Admitting: Hematology and Oncology

## 2018-06-07 DIAGNOSIS — Z85528 Personal history of other malignant neoplasm of kidney: Secondary | ICD-10-CM

## 2018-06-07 DIAGNOSIS — C8206 Follicular lymphoma grade I, intrapelvic lymph nodes: Secondary | ICD-10-CM

## 2018-06-10 ENCOUNTER — Telehealth: Payer: Self-pay | Admitting: *Deleted

## 2018-06-10 NOTE — Telephone Encounter (Signed)
"  My Temp = 98.2 F.  I've taken a Norco and a tylenol 500 mg earlier this morning."

## 2018-06-10 NOTE — Telephone Encounter (Signed)
"  Patty Salas [phone]) calling because I think I have the flu.  Diarrhea started yesterday.  Experiencing vomiting and weakness today.  Body feeling all over as if I have a fever.  My body aches all over, I do not feel like doing anything.  I sneeze occasionally, throat sore from throwing up, no chest discomfort.  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  Cancel tomorrow's appointments."  Patient asked if she may return call with temperature.  Does not have digital thermometer on hand.  Awaiting return call with temperature.

## 2018-06-10 NOTE — Telephone Encounter (Signed)
Spoke with Buddy Duty with provider information.  "I may see my PCP tomorrow.  I'll call back to reschedule when I feel better."

## 2018-06-10 NOTE — Telephone Encounter (Signed)
OK to cancel I hope she feels better soon She may need to be seen at urgent care locally

## 2018-06-11 ENCOUNTER — Inpatient Hospital Stay: Payer: 59 | Admitting: Hematology and Oncology

## 2018-06-11 ENCOUNTER — Inpatient Hospital Stay: Payer: 59

## 2018-06-12 ENCOUNTER — Encounter: Payer: 59 | Admitting: Registered Nurse

## 2018-06-12 IMAGING — DX DG THORACIC SPINE 3V
2 series · 2 of 2 positions shown · non-contrast
Comparison: PA and lateral chest x-ray January 01, 2007

CLINICAL DATA: Acute midline back pain.

EXAM:
THORACIC SPINE - 3 VIEWS

[thoracic spine lat]
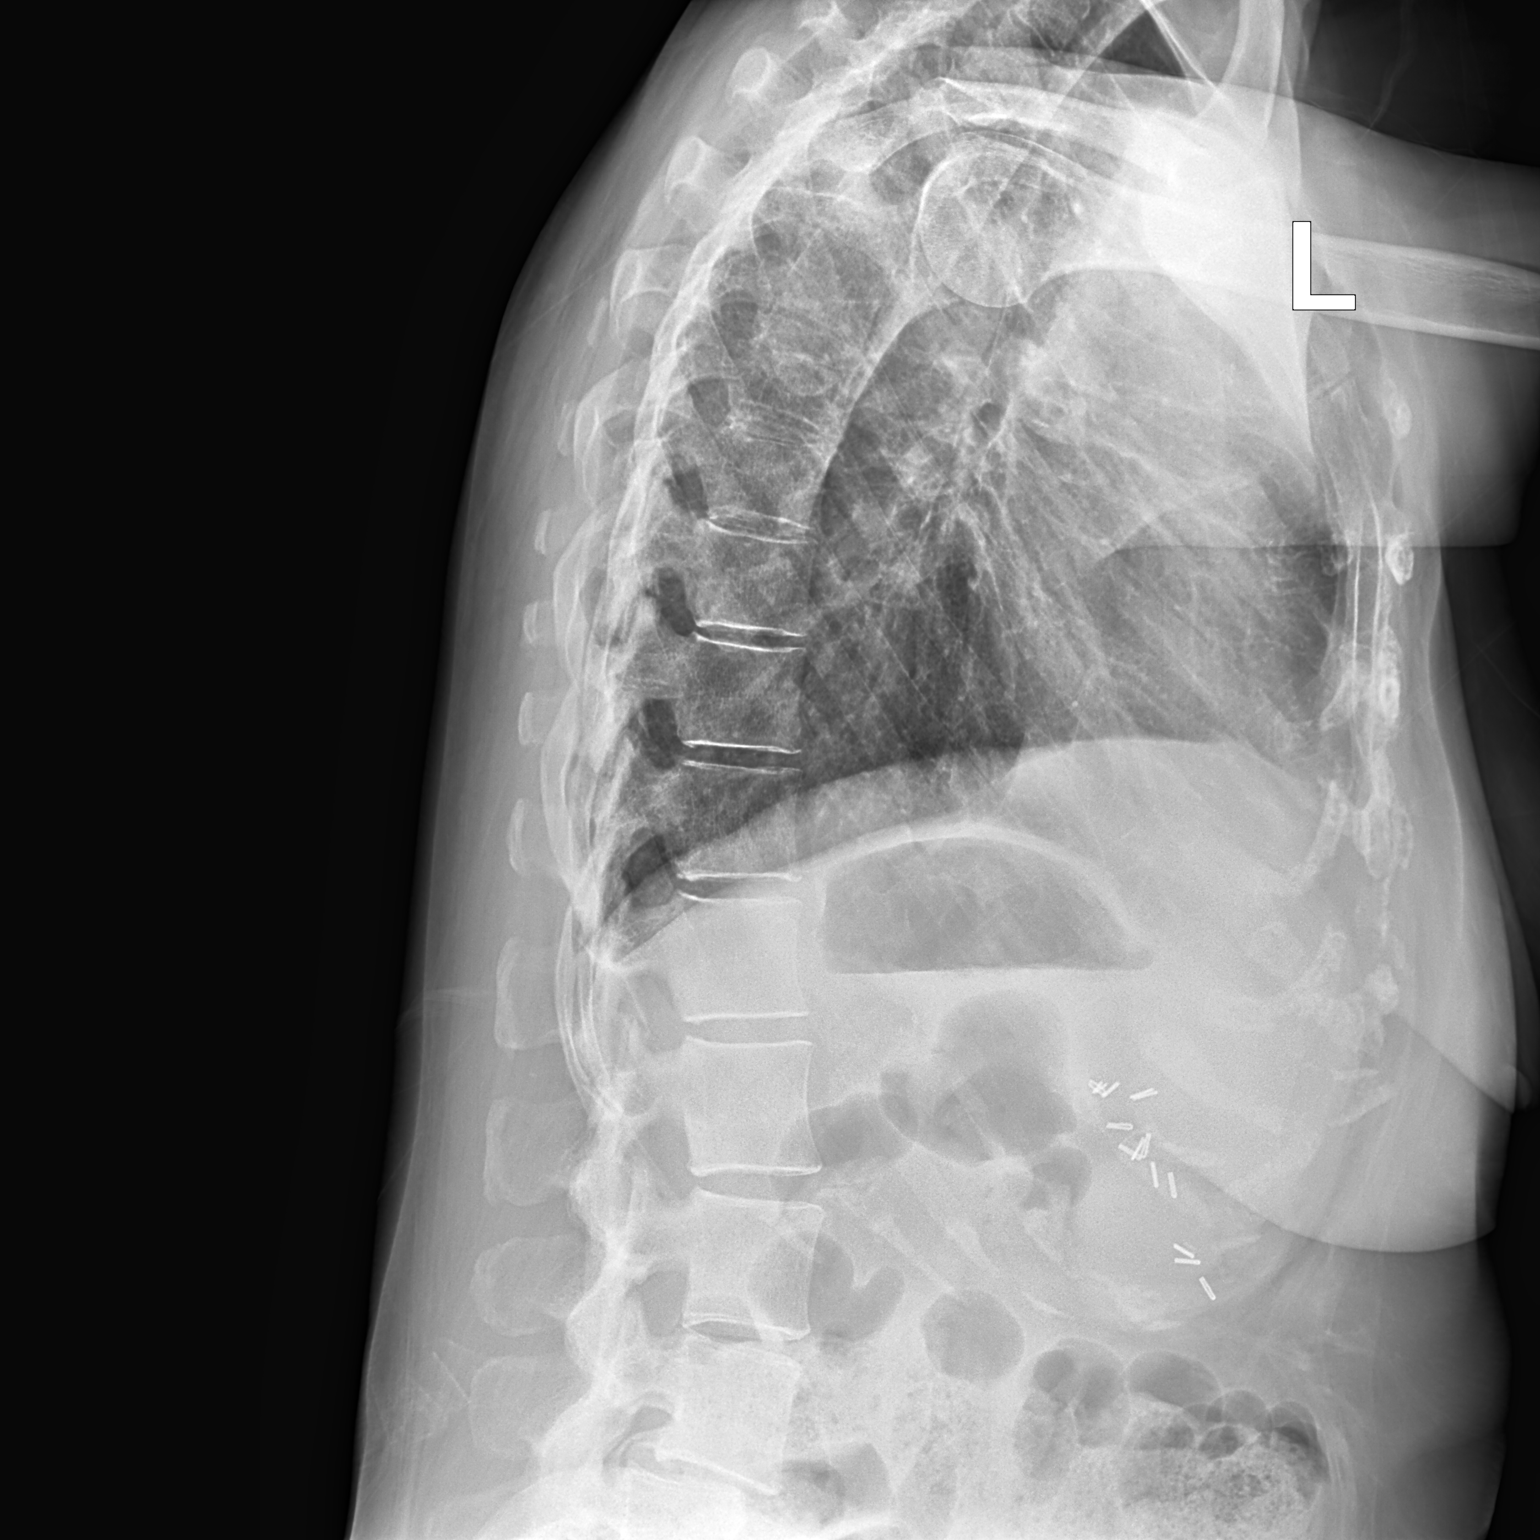

[thoracic spine ap]
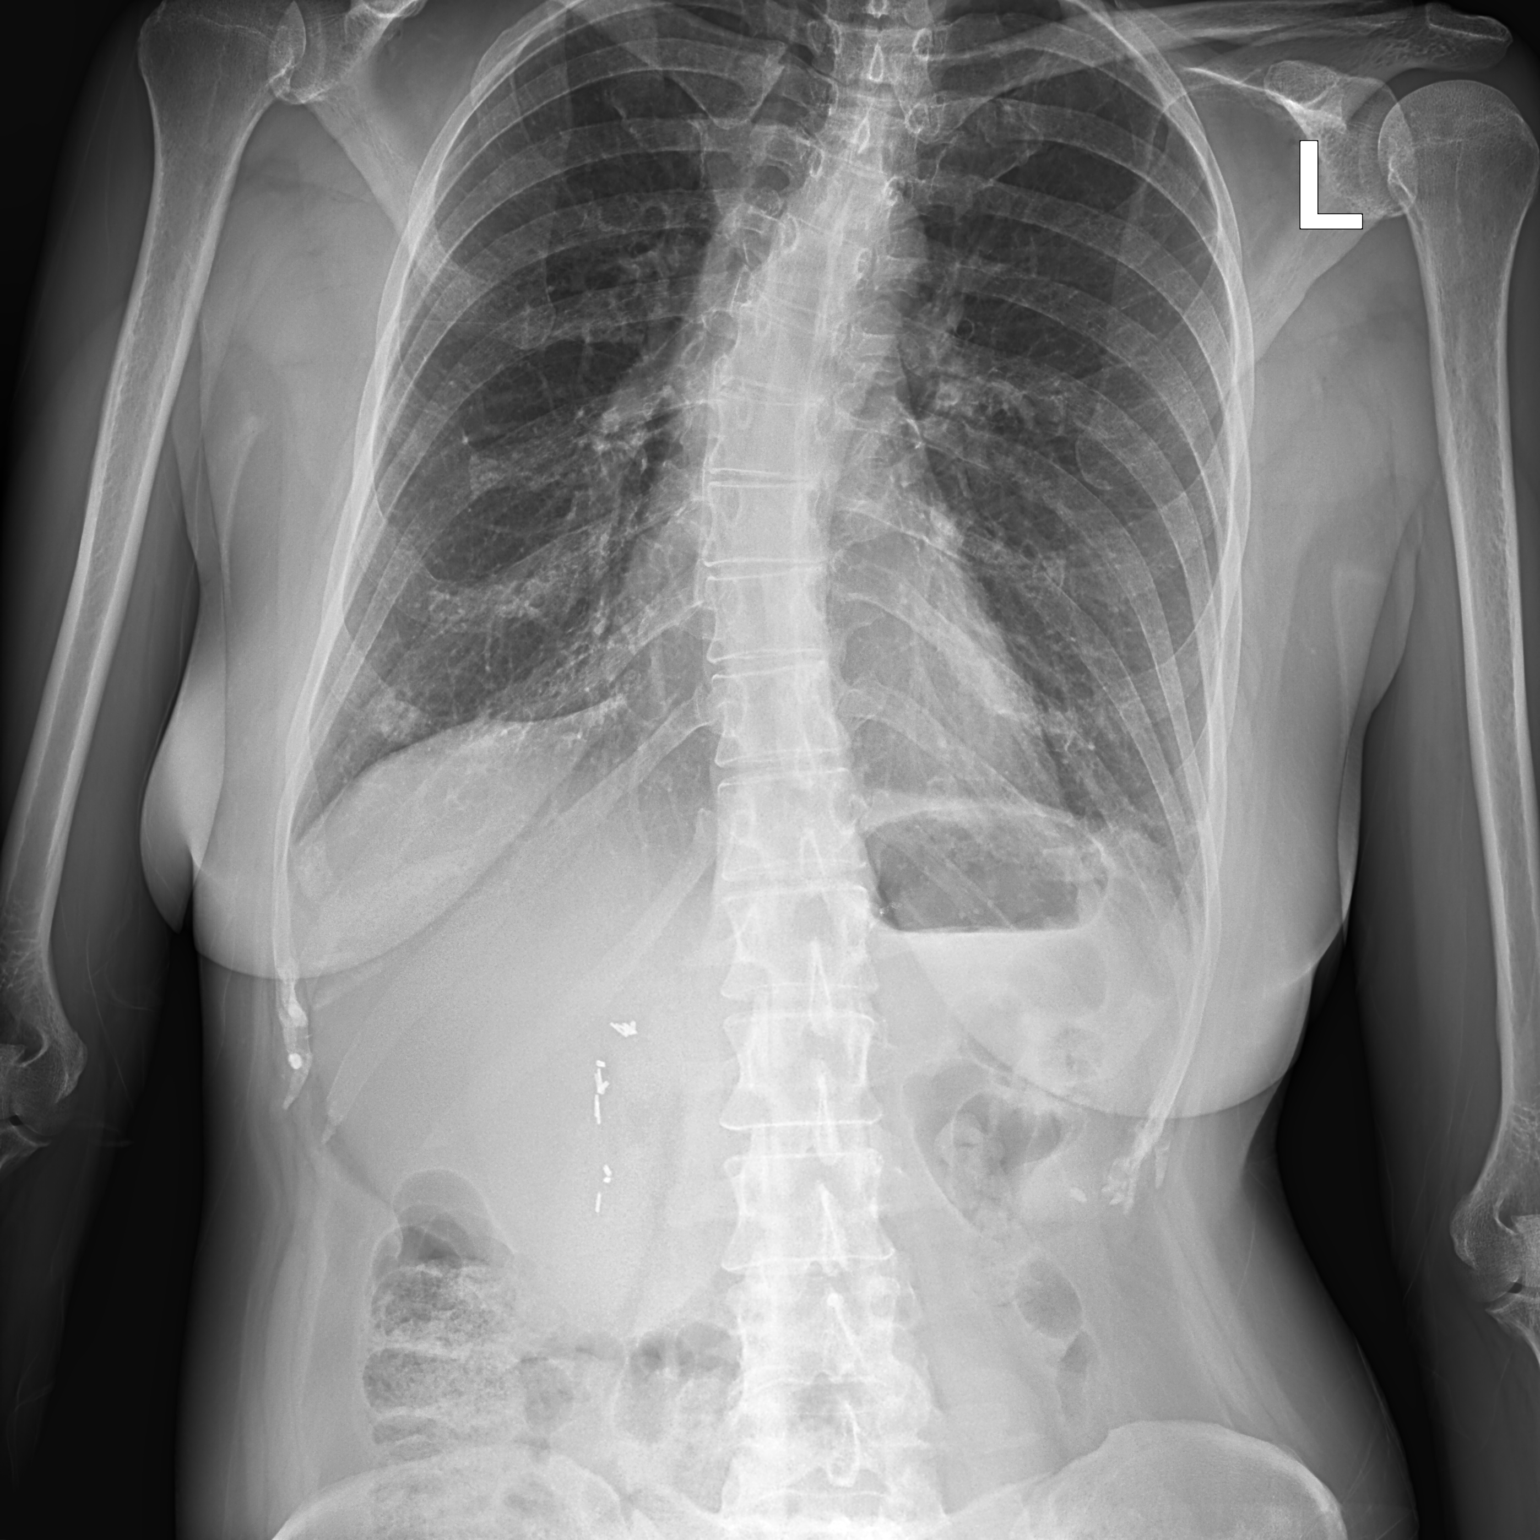

[2 of 2 positions shown; findings below may reference images not displayed]

FINDINGS: The thoracic vertebral bodies are preserved in height. There is
moderate dextrocurvature centered in the midthoracic spine. The disc
space heights are well maintained. Fine detail of the
cervicothoracic junction is not available on today's images.
IMPRESSION: No acute bony abnormality of the visualized portions of the thoracic
spine. The cervicothoracic junction cannot be fully assessed due to
technical factors. Chronic midthoracic dextroscoliosis.

## 2018-06-14 ENCOUNTER — Encounter: Payer: 59 | Attending: Physical Medicine and Rehabilitation | Admitting: Registered Nurse

## 2018-06-14 ENCOUNTER — Encounter: Payer: Self-pay | Admitting: Registered Nurse

## 2018-06-14 VITALS — BP 144/77 | HR 101 | Ht 65.0 in | Wt 151.2 lb

## 2018-06-14 DIAGNOSIS — M5136 Other intervertebral disc degeneration, lumbar region: Secondary | ICD-10-CM | POA: Insufficient documentation

## 2018-06-14 DIAGNOSIS — F0781 Postconcussional syndrome: Secondary | ICD-10-CM | POA: Diagnosis present

## 2018-06-14 DIAGNOSIS — M47812 Spondylosis without myelopathy or radiculopathy, cervical region: Secondary | ICD-10-CM | POA: Diagnosis not present

## 2018-06-14 DIAGNOSIS — M5416 Radiculopathy, lumbar region: Secondary | ICD-10-CM

## 2018-06-14 DIAGNOSIS — M5412 Radiculopathy, cervical region: Secondary | ICD-10-CM

## 2018-06-14 DIAGNOSIS — M546 Pain in thoracic spine: Secondary | ICD-10-CM

## 2018-06-14 DIAGNOSIS — G894 Chronic pain syndrome: Secondary | ICD-10-CM | POA: Diagnosis not present

## 2018-06-14 DIAGNOSIS — Z79899 Other long term (current) drug therapy: Secondary | ICD-10-CM | POA: Insufficient documentation

## 2018-06-14 DIAGNOSIS — M542 Cervicalgia: Secondary | ICD-10-CM | POA: Diagnosis not present

## 2018-06-14 DIAGNOSIS — M797 Fibromyalgia: Secondary | ICD-10-CM

## 2018-06-14 DIAGNOSIS — M7918 Myalgia, other site: Secondary | ICD-10-CM

## 2018-06-14 DIAGNOSIS — M4716 Other spondylosis with myelopathy, lumbar region: Secondary | ICD-10-CM | POA: Diagnosis present

## 2018-06-14 DIAGNOSIS — Z5181 Encounter for therapeutic drug level monitoring: Secondary | ICD-10-CM

## 2018-06-14 DIAGNOSIS — G43519 Persistent migraine aura without cerebral infarction, intractable, without status migrainosus: Secondary | ICD-10-CM | POA: Insufficient documentation

## 2018-06-14 DIAGNOSIS — G8929 Other chronic pain: Secondary | ICD-10-CM

## 2018-06-14 DIAGNOSIS — Z79891 Long term (current) use of opiate analgesic: Secondary | ICD-10-CM

## 2018-06-14 MED ORDER — FENTANYL 25 MCG/HR TD PT72: 25.0000 ug | MEDICATED_PATCH | TRANSDERMAL | 0 refills | Status: DC

## 2018-06-14 NOTE — Progress Notes (Signed)
Subjective:    Patient ID: Patty Salas, female    DOB: 12-Jan-1956, 62 y.o.   MRN: [id number]  HPI: [name] is a 62 year old female who returns for follow up appointment for chronic pain and medication refill. She states her pain is located in her neck radiating into her bilateral shoulders, mid- lower back radiating into her bilateral lower extremities. She rates her pain 9. Her current exercise regime is walking and performing stretching exercise.   Patty Salas reports on 05/23/2018, she was walking to the bathroom at 2:30 am and tripped over the rug in the bathroom. She hit her head on the floor she states and landed on her left side, she didn't seek medical attention. She states she was able to pick herself up. I have asked Patty Salas to remove all rugs in the home, she states she removes them and her husband puts them back down. Educated on falls prevention, asked for her to speak with her husband again about removing the rugs, she verbalizes understanding.   Patty Salas Morphine Equivalent is 75.00 MME. She is also prescribed Lorazepam  by Dr. Reece Levy. We have discussed the black box warning of using opioids and benzodiazepines. I highlighted the dangers of using these drugs together and discussed the adverse events including respiratory suppression, overdose, cognitive impairment and importance of compliance with current regimen. We will continue to monitor and adjust as indicated. She is being closely monitored and under the care of her psychiatrist Dr. Reece Levy..  Pain Inventory Average Pain 9 Pain Right Now 9 My pain is constant, sharp, burning, dull, stabbing and aching  In the last 24 hours, has pain interfered with the following? General activity 9 Relation with others 8 Enjoyment of life 9 What TIME of day is your pain at its worst? all Sleep (in general) Fair  Pain is worse with: walking, bending, sitting, inactivity, standing and some activites Pain improves with: rest,  heat/ice, therapy/exercise, pacing activities, medication and injections Relief from Meds: 6  Mobility walk with assistance use a cane use a walker ability to climb steps?  yes do you drive?  yes  Function not employed: date last employed . I need assistance with the following:  meal prep, household duties and shopping  Neuro/Psych bladder control problems bowel control problems weakness numbness tremor tingling trouble walking spasms dizziness confusion depression anxiety  Prior Studies Any changes since last visit?  no  Physicians involved in your care Any changes since last visit?  no   Family History  Problem Relation Age of Onset  . Cancer Mother        stomach  . Migraines Mother   . Arthritis Mother   . Emphysema Mother   . Heart disease Father   . Hypertension Father   . Cancer Father        colon  . Dementia Father   . Hypertension Brother   . Migraines Brother   . Hypertension Brother   . Migraines Brother   . Alcohol abuse Brother   . Bipolar disorder Brother   . Migraines Daughter   . Arthritis Maternal Grandmother   . Cancer Maternal Grandmother        stomach  . Diabetes Maternal Grandmother   . Stroke Maternal Grandmother   . Cancer Maternal Aunt        lung  . Emphysema Maternal Aunt   . Cancer Paternal Uncle        colon  .  Hypertension Maternal Aunt   . Heart disease Maternal Aunt   . Cancer Paternal Uncle        lung   Social History   Socioeconomic History  . Marital status: Married    Spouse name: Not on file  . Number of children: 1  . Years of education: COLLEGE1  . Highest education level: Not on file  Occupational History  . Occupation: HOUSEWIFE    Employer: UNEMPLOYED  Social Needs  . Financial resource strain: Not on file  . Food insecurity:    Worry: Not on file    Inability: Not on file  . Transportation needs:    Medical: Not on file    Non-medical: Not on file  Tobacco Use  . Smoking status: Never  Smoker  . Smokeless tobacco: Never Used  Substance and Sexual Activity  . Alcohol use: No  . Drug use: No  . Sexual activity: Not on file  Lifestyle  . Physical activity:    Days per week: Not on file    Minutes per session: Not on file  . Stress: Not on file  Relationships  . Social connections:    Talks on phone: Not on file    Gets together: Not on file    Attends religious service: Not on file    Active member of club or organization: Not on file    Attends meetings of clubs or organizations: Not on file    Relationship status: Not on file  Other Topics Concern  . Not on file  Social History Narrative   Patient is right handed.   Patient drinks caffeine occasionally.   Past Surgical History:  Procedure Laterality Date  .  kidney stones    . ABDOMINAL ADHESION SURGERY    . ABDOMINAL HYSTERECTOMY  1995   partial- hemmoraged after surgery-stitch came loose  . APPENDECTOMY  1993   hemmoraged after surgery- stitch came loose  . CERVICAL DISC SURGERY  06/04/2001   with fusion  . CHOLECYSTECTOMY  1985  . colonoscopy    . COLONOSCOPY    . CYSTOSCOPY    . FOOT SURGERY     lt  . jj stent  07/2002   with ureteroscopy, cystoscopy and then removal of stent in office  . LITHOTRIPSY    . LYMPH NODE BIOPSY Right 03/06/2014   Procedure: right groin LYMPH NODE BIOPSY;  Surgeon: Merrie Roof, MD;  Location: [facility];  Service: General;  Laterality: Right;  . LYMPHADENECTOMY N/A 12/29/2015   Procedure: RETROPERITONEAL LYMPHADENECTOMY;  Surgeon: Alexis Frock, MD;  Location: [facility];  Service: Urology;  Laterality: N/A;  . NECK SURGERY  2002  . ROBOT ASSISTED LAPAROSCOPIC NEPHRECTOMY Left 12/29/2015   Procedure: XI ROBOTIC ASSISTED LEFT LAPAROSCOPIC NEPHRECTOMY;  Surgeon: Alexis Frock, MD;  Location: [facility];  Service: Urology;  Laterality: Left;  . TONSILLECTOMY  1976   Past Medical History:  Diagnosis Date  . Anxiety   . Arthritis   . Asthma   . Bipolar  affective ([place])   . Calcifying tendinitis of shoulder   . Carpal tunnel syndrome   . Cervical facet syndrome   . Chronic pain syndrome   . Complication of anesthesia   . Depression   . Diverticulosis   . Falls   . Fibromyalgia   . 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 Full dentures   . Gout   . Headache(784.0)    migraines  .  Herpesviral infection   . Hypertension   . IBS (irritable bowel syndrome)    with diarrhea  . Lymphadenopathy   . Myalgia and myositis, unspecified   . PONV (postoperative nausea and vomiting)   . PTSD (post-traumatic stress disorder)   . Renal calculi   . Restless legs syndrome (RLS)   . Thoracic radiculopathy   . Vitamin D deficiency   . Wears glasses    BP (!) 144/77   Pulse (!) 101   Ht 5\' 5"  (1.651 m)   Wt 151 lb 3.2 oz (68.6 kg)   SpO2 97%   BMI 25.16 kg/m   Opioid Risk Score:   Fall Risk Score:  `1  Depression screen PHQ 2/9  Depression screen [facility] 2/9 02/05/2018 10/05/2017 09/04/2017 08/02/2017 07/05/2017 04/24/2017 01/17/2017  Decreased Interest 1 1 1 1 3 2 2   Down, Depressed, Hopeless 1 1 1 1 3 2 2   PHQ - 2 Score 2 2 2 2 6 4 4   Altered sleeping - - 1 - - - -  Tired, decreased energy - - 2 - - - -  Change in appetite - - 2 - - - -  Feeling bad or failure about yourself  - - 2 - - - -  Trouble concentrating - - 1 - - - -  Moving slowly or fidgety/restless - - 1 - - - -  Suicidal thoughts - - 0 - - - -  PHQ-9 Score - - 11 - - - -  Difficult doing work/chores - - Somewhat difficult - - - -  Some recent data might be hidden     Review of Systems  Constitutional: Positive for chills, diaphoresis, fever and unexpected weight change.  HENT: Negative.   Eyes: Negative.   Respiratory: Positive for wheezing.   Cardiovascular: Negative.   Gastrointestinal: Positive for abdominal pain, diarrhea, nausea and vomiting.  Endocrine: Negative.   Genitourinary: Negative.   Musculoskeletal: Positive for  arthralgias, gait problem and myalgias.  Skin: Negative.   Allergic/Immunologic: Negative.   Neurological: Positive for dizziness, tremors, weakness and numbness.  Hematological: Negative.   Psychiatric/Behavioral: Positive for confusion and dysphoric mood. The patient is nervous/anxious.   All other systems reviewed and are negative.      Objective:   Physical Exam  Constitutional: She is oriented to person, place, and time. She appears well-developed and well-nourished.  HENT:  Head: Normocephalic and atraumatic.  Neck: Normal range of motion. Neck supple.  Cervical Paraspinal Tenderness: C-5-C-6  Cardiovascular: Normal rate and regular rhythm.  Pulmonary/Chest: Effort normal and breath sounds normal.  Musculoskeletal:  Normal Muscle Bulk and Muscle testing Reveals: Upper Extremities: Full ROM and Muscle Strength 5/5 Bilateral AC Joint Tenderness Thoracic Hypersensitivity: T-1-T-3  Thoracic Paraspinal Tenderness: T-10-T-12 Lumbar Paraspinal Tenderness: L-4-L-5 Lower Extremities: Full ROM and Muscle Strength 5/5 Arises from Table with ease Narrow Based Gait  Neurological: She is alert and oriented to person, place, and time.  Skin: Skin is warm and dry.  Psychiatric: She has a normal mood and affect. Her behavior is normal.  Nursing note and vitals reviewed.         Assessment & Plan:  1. History of fibromyalgia with myofascial pain and multiple trigger points.Continue with Heat and exercise Regime. Continue withcurrent medication regimen withgabapentin. 06/14/2018 2. Chronic migraine headaches.Continue to Monitor. S/P Botox.on 05/30/2017 06/14/2018 3. Midline Low Back Pain/ Lumbar Spondylosis/Lumbar degenerative disk disease, L4-5 :Continue with HEP and current medication regimen. Refilled: Fentanyl Patch 25 mcg one patch  every three days #10 and Continue Hydrocodone 5/325 mg one tablet every 6 hours as needed #90. 06/14/2018. 4. History of Left Renal Mass: S/P  Left Laparoscopic Nephrectomy 02/24/2016. Urology Following. 06/14/2018. 5. 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 06/14/2018 6. Cervicalgia/ Cervical RadiculitisCervical Spondylosis: Continuecurrent medication regiment withGabapentin. Continue with HEP and Continue to monitor. 06/14/2018 7. Muscle Spasm: Continuecurrent medication regiment withFlexeril as needed. 06/14/2018  20 minutes of face to face patient care time was spent during this visit. All questions were encouraged and answered.  Follow up in 1 month

## 2018-06-20 ENCOUNTER — Ambulatory Visit: Payer: 59 | Admitting: Podiatry

## 2018-06-20 ENCOUNTER — Ambulatory Visit: Payer: 59

## 2018-06-20 DIAGNOSIS — M778 Other enthesopathies, not elsewhere classified: Secondary | ICD-10-CM

## 2018-06-20 DIAGNOSIS — M779 Enthesopathy, unspecified: Secondary | ICD-10-CM | POA: Diagnosis not present

## 2018-06-20 DIAGNOSIS — M2021 Hallux rigidus, right foot: Secondary | ICD-10-CM

## 2018-06-20 DIAGNOSIS — M2022 Hallux rigidus, left foot: Secondary | ICD-10-CM

## 2018-06-21 ENCOUNTER — Ambulatory Visit: Payer: 59 | Admitting: Podiatry

## 2018-06-24 NOTE — Progress Notes (Signed)
Subjective: 62 year old female presents the office today for concerns of recurrent pain and she points to the second MPJs bilaterally the right side worse than the left.  She states it feels the same as it did previously.  The injection that she had previously with Dr. Paulla Dolly did help.  She states that the more she is on her feet starts to hurt more.  She notices some minimal swelling on the right side more than left.  No recent injury or falls.  She has orthotics which does help.  Orthotics are couple years old at this time. Denies any systemic complaints such as fevers, chills, nausea, vomiting. No acute changes since last appointment, and no other complaints at this time.   Objective: AAO x3, NAD DP/PT pulses palpable bilaterally, CRT less than 3 seconds There is absent range of motion of the first MPJs bilaterally consistent with significant hallux rigidus.  There is tenderness on the second MPJs bilaterally the right side worse than left on the right side there is minimal swelling to the area.  There is no area pinpoint tenderness.  There is no erythema or increase in warmth.  No open lesions or pre-ulcerative lesions.  No pain with calf compression, swelling, warmth, erythema  Assessment: Right second MPJ capsulitis due to hallux rigidus, biomechanical changes  Plan: -All treatment options discussed with the patient including all alternatives, risks, complications.  -X-rays from last appointment were reviewed with her today.  There is arthritic changes present the first MPJ.  There is no evidence of fracture identified today. -I do think the reason why she is getting pain on the second MPJ is from compensation given the significant hallux rigidus.  I do think that she will benefit from new inserts and had Liliane Channel evaluate her today she was to go him proceed with the inserts and recommended her today.  The old inserts were not provided enough offloading for the lesser metatarsals and also they are  worn out splint.  Also continue wearing a stiffer soled shoe. -Patient encouraged to call the office with any questions, concerns, change in symptoms.   Trula Slade DPM

## 2018-06-25 ENCOUNTER — Ambulatory Visit: Payer: 59 | Admitting: Nurse Practitioner

## 2018-07-09 ENCOUNTER — Encounter: Payer: 59 | Admitting: Physical Medicine & Rehabilitation

## 2018-07-11 ENCOUNTER — Other Ambulatory Visit: Payer: 59 | Admitting: Orthotics

## 2018-07-11 ENCOUNTER — Ambulatory Visit: Payer: 59 | Admitting: Registered Nurse

## 2018-07-12 ENCOUNTER — Other Ambulatory Visit: Payer: Self-pay

## 2018-07-12 ENCOUNTER — Encounter: Payer: 59 | Attending: Physical Medicine and Rehabilitation | Admitting: Registered Nurse

## 2018-07-12 ENCOUNTER — Encounter: Payer: Self-pay | Admitting: Registered Nurse

## 2018-07-12 VITALS — BP 138/82 | HR 96 | Ht 65.0 in | Wt 150.2 lb

## 2018-07-12 DIAGNOSIS — M546 Pain in thoracic spine: Secondary | ICD-10-CM

## 2018-07-12 DIAGNOSIS — F0781 Postconcussional syndrome: Secondary | ICD-10-CM | POA: Insufficient documentation

## 2018-07-12 DIAGNOSIS — G894 Chronic pain syndrome: Secondary | ICD-10-CM

## 2018-07-12 DIAGNOSIS — M797 Fibromyalgia: Secondary | ICD-10-CM

## 2018-07-12 DIAGNOSIS — Z79899 Other long term (current) drug therapy: Secondary | ICD-10-CM | POA: Insufficient documentation

## 2018-07-12 DIAGNOSIS — M7062 Trochanteric bursitis, left hip: Secondary | ICD-10-CM

## 2018-07-12 DIAGNOSIS — M5416 Radiculopathy, lumbar region: Secondary | ICD-10-CM

## 2018-07-12 DIAGNOSIS — M5136 Other intervertebral disc degeneration, lumbar region: Secondary | ICD-10-CM | POA: Diagnosis present

## 2018-07-12 DIAGNOSIS — M542 Cervicalgia: Secondary | ICD-10-CM | POA: Diagnosis not present

## 2018-07-12 DIAGNOSIS — M47812 Spondylosis without myelopathy or radiculopathy, cervical region: Secondary | ICD-10-CM

## 2018-07-12 DIAGNOSIS — M5412 Radiculopathy, cervical region: Secondary | ICD-10-CM | POA: Diagnosis not present

## 2018-07-12 DIAGNOSIS — M4716 Other spondylosis with myelopathy, lumbar region: Secondary | ICD-10-CM

## 2018-07-12 DIAGNOSIS — G8929 Other chronic pain: Secondary | ICD-10-CM

## 2018-07-12 DIAGNOSIS — G43519 Persistent migraine aura without cerebral infarction, intractable, without status migrainosus: Secondary | ICD-10-CM | POA: Diagnosis present

## 2018-07-12 DIAGNOSIS — M79671 Pain in right foot: Secondary | ICD-10-CM

## 2018-07-12 DIAGNOSIS — M654 Radial styloid tenosynovitis [de Quervain]: Secondary | ICD-10-CM

## 2018-07-12 DIAGNOSIS — M79672 Pain in left foot: Secondary | ICD-10-CM

## 2018-07-12 DIAGNOSIS — Z5181 Encounter for therapeutic drug level monitoring: Secondary | ICD-10-CM

## 2018-07-12 DIAGNOSIS — Z79891 Long term (current) use of opiate analgesic: Secondary | ICD-10-CM

## 2018-07-12 DIAGNOSIS — M7061 Trochanteric bursitis, right hip: Secondary | ICD-10-CM

## 2018-07-12 MED ORDER — METHYLPREDNISOLONE 4 MG PO TBPK: ORAL_TABLET | ORAL | 0 refills | Status: DC

## 2018-07-12 MED ORDER — FENTANYL 25 MCG/HR TD PT72: 25.0000 ug | MEDICATED_PATCH | TRANSDERMAL | 0 refills | Status: DC

## 2018-07-12 MED ORDER — HYDROCODONE-ACETAMINOPHEN 5-325 MG PO TABS: 1.0000 | ORAL_TABLET | Freq: Three times a day (TID) | ORAL | 0 refills | Status: DC | PRN

## 2018-07-12 NOTE — Progress Notes (Signed)
Subjective:    Patient ID: Patty Salas, female    DOB: 1956/01/10, 62 y.o.   MRN: [id number]  HPI: Patty Salas is a 62 year old female who returns for follow up appointment for chronic pain and medication refill. She states her pain is located in her right wrist also reports increase intensity of pain, neck radiating into her bilateral shoulders, mid-lower back radiating into her bilateral lower extremities R>L, bilateral hips R>L and right foot pain. She rates her pain 10. Her current exercise regime is  Walking and performing stretching exercises. Also reports she has followed up with her podiatrist regarding her foot pain, she's having orthotics made she states.   [name] Morphine Equivalent is 75.00 MME. She is also prescribed Lorazepam by Dr. Reece Levy.We have discussed the black box warning of using opioids and benzodiazepines. I highlighted the dangers of using these drugs together and discussed the adverse events including respiratory suppression, overdose, cognitive impairment and importance of compliance with current regimen. We will continue to monitor and adjust as indicated.   She is being closely monitored and under the care of her psychiatrist Dr. Reece Levy.   Last UDS was Performed on 04/10/2018, it was consistent.   Pain Inventory Average Pain 9 Pain Right Now 10 My pain is constant, sharp, burning, dull, stabbing and aching  In the last 24 hours, has pain interfered with the following? General activity 9 Relation with others 9 Enjoyment of life 9 What TIME of day is your pain at its worst? all Sleep (in general) Fair  Pain is worse with: walking, bending, sitting, inactivity, standing and some activites Pain improves with: rest, heat/ice, therapy/exercise, medication and injections Relief from Meds: 6  Mobility walk with assistance use a cane use a walker how many minutes can you walk? 5 ability to climb steps?  yes do you drive?  yes  Function disabled:  date disabled n/a I need assistance with the following:  meal prep, household duties and shopping  Neuro/Psych bladder control problems bowel control problems weakness numbness tremor tingling trouble walking spasms dizziness confusion depression anxiety  Prior Studies Any changes since last visit?  yes bone scan x-rays CT/MRI nerve study  Physicians involved in your care Any changes since last visit?  yes   Family History  Problem Relation Age of Onset  . Cancer Mother        stomach  . Migraines Mother   . Arthritis Mother   . Emphysema Mother   . Heart disease Father   . Hypertension Father   . Cancer Father        colon  . Dementia Father   . Hypertension Brother   . Migraines Brother   . Hypertension Brother   . Migraines Brother   . Alcohol abuse Brother   . Bipolar disorder Brother   . Migraines Daughter   . Arthritis Maternal Grandmother   . Cancer Maternal Grandmother        stomach  . Diabetes Maternal Grandmother   . Stroke Maternal Grandmother   . Cancer Maternal Aunt        lung  . Emphysema Maternal Aunt   . Cancer Paternal Uncle        colon  . Hypertension Maternal Aunt   . Heart disease Maternal Aunt   . Cancer Paternal Uncle        lung   Social History   Socioeconomic History  . Marital status: Married    Spouse name:  Not on file  . Number of children: 1  . Years of education: COLLEGE1  . Highest education level: Not on file  Occupational History  . Occupation: HOUSEWIFE    Employer: UNEMPLOYED  Social Needs  . Financial resource strain: Not on file  . Food insecurity:    Worry: Not on file    Inability: Not on file  . Transportation needs:    Medical: Not on file    Non-medical: Not on file  Tobacco Use  . Smoking status: Never Smoker  . Smokeless tobacco: Never Used  Substance and Sexual Activity  . Alcohol use: No  . Drug use: No  . Sexual activity: Not on file  Lifestyle  . Physical activity:    Days per  week: Not on file    Minutes per session: Not on file  . Stress: Not on file  Relationships  . Social connections:    Talks on phone: Not on file    Gets together: Not on file    Attends religious service: Not on file    Active member of club or organization: Not on file    Attends meetings of clubs or organizations: Not on file    Relationship status: Not on file  Other Topics Concern  . Not on file  Social History Narrative   Patient is right handed.   Patient drinks caffeine occasionally.   Past Surgical History:  Procedure Laterality Date  .  kidney stones    . ABDOMINAL ADHESION SURGERY    . ABDOMINAL HYSTERECTOMY  1995   partial- hemmoraged after surgery-stitch came loose  . APPENDECTOMY  1993   hemmoraged after surgery- stitch came loose  . CERVICAL DISC SURGERY  06/04/2001   with fusion  . CHOLECYSTECTOMY  1985  . colonoscopy    . COLONOSCOPY    . CYSTOSCOPY    . FOOT SURGERY     lt  . jj stent  07/2002   with ureteroscopy, cystoscopy and then removal of stent in office  . LITHOTRIPSY    . LYMPH NODE BIOPSY Right 03/06/2014   Procedure: right groin LYMPH NODE BIOPSY;  Surgeon: Merrie Roof, MD;  Location: [facility];  Service: General;  Laterality: Right;  . LYMPHADENECTOMY N/A 12/29/2015   Procedure: RETROPERITONEAL LYMPHADENECTOMY;  Surgeon: Alexis Frock, MD;  [facility];  Service: Urology;  Laterality: N/A;  . NECK SURGERY  2002  . ROBOT ASSISTED LAPAROSCOPIC NEPHRECTOMY Left 12/29/2015   Procedure: XI ROBOTIC ASSISTED LEFT LAPAROSCOPIC NEPHRECTOMY;  Surgeon: Alexis Frock, MD;  [facility];  Service: Urology;  Laterality: Left;  . TONSILLECTOMY  1976   Past Medical History:  Diagnosis Date  . Anxiety   . Arthritis   . Asthma   . Bipolar affective ([place])   . Calcifying tendinitis of shoulder   . Carpal tunnel syndrome   . Cervical facet syndrome   . Chronic pain syndrome   . Complication of anesthesia   . Depression   .  Diverticulosis   . Falls   . Fibromyalgia   . 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 Full dentures   . Gout   . Headache(784.0)    migraines  . Herpesviral infection   . Hypertension   . IBS (irritable bowel syndrome)    with diarrhea  . Lymphadenopathy   . Myalgia and myositis, unspecified   . PONV (postoperative nausea and vomiting)   . PTSD (post-traumatic stress  disorder)   . Renal calculi   . Restless legs syndrome (RLS)   . Thoracic radiculopathy   . Vitamin D deficiency   . Wears glasses    BP 138/82   Pulse 96   Ht 5\' 5"  (1.651 m)   Wt 150 lb 3.2 oz (68.1 kg)   SpO2 97%   BMI 24.99 kg/m   Opioid Risk Score:   Fall Risk Score:  `1  Depression screen PHQ 2/9  Depression screen [facility] 2/9 07/12/2018 02/05/2018 10/05/2017 09/04/2017 08/02/2017 07/05/2017 04/24/2017  Decreased Interest 1 1 1 1 1 3 2   Down, Depressed, Hopeless 1 1 1 1 1 3 2   PHQ - 2 Score 2 2 2 2 2 6 4   Altered sleeping - - - 1 - - -  Tired, decreased energy - - - 2 - - -  Change in appetite - - - 2 - - -  Feeling bad or failure about yourself  - - - 2 - - -  Trouble concentrating - - - 1 - - -  Moving slowly or fidgety/restless - - - 1 - - -  Suicidal thoughts - - - 0 - - -  PHQ-9 Score - - - 11 - - -  Difficult doing work/chores - - - Somewhat difficult - - -  Some recent data might be hidden    Review of Systems  Constitutional: Positive for diaphoresis and unexpected weight change.  HENT: Negative.   Eyes: Negative.   Respiratory: Positive for cough and wheezing.   Cardiovascular: Negative.   Gastrointestinal: Positive for abdominal pain, constipation, diarrhea and nausea.  Endocrine: Negative.   Genitourinary: Negative.   Musculoskeletal: Negative.   Skin: Negative.   Allergic/Immunologic: Negative.   Neurological: Negative.   Hematological: Negative.   Psychiatric/Behavioral: Negative.   All other systems reviewed and are negative.      Objective:     Physical Exam  Constitutional: She is oriented to person, place, and time. She appears well-developed and well-nourished.  HENT:  Head: Normocephalic and atraumatic.  Neck: Normal range of motion. Neck supple.  Cervical Paraspinal Tenderness: C-5-C-6  Cardiovascular: Normal rate and regular rhythm.  Pulmonary/Chest: Effort normal and breath sounds normal.  Musculoskeletal:  Normal Muscle Bulk and Muscle Testing Reveals: Upper Extremities: Full ROM and Muscle Strength 5/5 Bilateral AC Joint Tenderness Thoracic Hypersensitivity Lumbar Paraspinal Tenderness: L-3-L-5 Bilateral Greater Trochanter tenderness Lower Extremities: Full ROM and Muscle Strength 5/5 Arises from Table slowly using cane for support Narrow Based gait  Neurological: She is alert and oriented to person, place, and time.  Skin: Skin is warm and dry.  Psychiatric: She has a normal mood and affect. Her behavior is normal.  Nursing note and vitals reviewed.         Assessment & Plan:  1. History of fibromyalgia with myofascial pain and multiple trigger points.Continue with Heat and exercise Regime. Continue withcurrent medication regimen withgabapentin. 07/12/2018 2. Chronic migraine headaches.Continue current medication regimen. Continue to Monitor. 07/12/2018. 3. Midline Low Back Pain/ Lumbar Spondylosis/Lumbar degenerative disk disease, L4-5/ Lumbar Radiculitis : Continue Gabapentin. Continue with HEP and current medication regimen.07/12/2018 Refilled: Fentanyl Patch 25 mcg one patch every three days #10 and Continue Hydrocodone 5/325 mg one tablet every 6 hours as needed #90. 06/14/2018. 4. 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 Urology Following. 07/12/2018. 5. 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 07/12/2018 6. Cervicalgia/ Cervical RadiculitisCervical Spondylosis: Continuecurrent medication regiment withGabapentin. Continue with HEP and Continue to  monitor.  07/12/2018 7. Muscle Spasm: Continuecurrent medication regiment withFlexeril as needed. 07/12/2018 8. Bilateral Foot Pain: Podiatrist Following.  9. De Quervain's Tenosynovitis: RX: Medrol Dose Pak.   20 minutes of face to face patient care time was spent during this visit. All questions were encouraged and answered.  Follow up in 1 month

## 2018-07-15 ENCOUNTER — Telehealth: Payer: Self-pay | Admitting: Family Medicine

## 2018-07-15 NOTE — Telephone Encounter (Signed)
Patient called states she thinks she has pneumonia, has severe flu like symptoms & wishes nurse/ provider to call her @ [phone]-- made pt appt w/ provider for Wed 10/16-- offered Tuesday w/ [place] pt unavailable

## 2018-07-16 ENCOUNTER — Other Ambulatory Visit: Payer: 59 | Admitting: Orthotics

## 2018-07-16 NOTE — Telephone Encounter (Signed)
Spoke to patient, she was wanting to know if we would do any blood work at appointment that she would need to be fasting.  Advised patient that she does not need to fast for appointment. MPulliam, CMA/RT(R)

## 2018-07-17 ENCOUNTER — Ambulatory Visit: Payer: 59 | Admitting: Family Medicine

## 2018-07-25 ENCOUNTER — Other Ambulatory Visit: Payer: Self-pay | Admitting: Registered Nurse

## 2018-07-25 ENCOUNTER — Telehealth: Payer: Self-pay | Admitting: Registered Nurse

## 2018-07-25 NOTE — Telephone Encounter (Signed)
Return Patty Salas call, she was prescribed Medrol dose pak on 10/11/ 2019 She was requesting another dose, due to right hand pain. She has a scheduled appointment with Dr. Naaman Plummer on 08/06/2018,  Patty Salas asked about steroid injection, it will be Dr. Naaman Plummer decision regarding steroid injection for right hand tendonitis, she verbalizes understanding.

## 2018-07-25 NOTE — Telephone Encounter (Signed)
Pt called requesting refill on Medrol Dosepak

## 2018-08-06 ENCOUNTER — Encounter: Payer: Self-pay | Admitting: Physical Medicine & Rehabilitation

## 2018-08-06 ENCOUNTER — Encounter: Payer: 59 | Attending: Physical Medicine and Rehabilitation | Admitting: Physical Medicine & Rehabilitation

## 2018-08-06 VITALS — BP 137/81 | HR 111 | Ht 66.0 in | Wt 153.0 lb

## 2018-08-06 DIAGNOSIS — G43519 Persistent migraine aura without cerebral infarction, intractable, without status migrainosus: Secondary | ICD-10-CM | POA: Insufficient documentation

## 2018-08-06 DIAGNOSIS — M47812 Spondylosis without myelopathy or radiculopathy, cervical region: Secondary | ICD-10-CM

## 2018-08-06 DIAGNOSIS — M797 Fibromyalgia: Secondary | ICD-10-CM | POA: Diagnosis present

## 2018-08-06 DIAGNOSIS — F0781 Postconcussional syndrome: Secondary | ICD-10-CM | POA: Diagnosis present

## 2018-08-06 DIAGNOSIS — M4716 Other spondylosis with myelopathy, lumbar region: Secondary | ICD-10-CM | POA: Insufficient documentation

## 2018-08-06 DIAGNOSIS — Z5181 Encounter for therapeutic drug level monitoring: Secondary | ICD-10-CM | POA: Insufficient documentation

## 2018-08-06 DIAGNOSIS — M5136 Other intervertebral disc degeneration, lumbar region: Secondary | ICD-10-CM | POA: Diagnosis present

## 2018-08-06 DIAGNOSIS — Z79899 Other long term (current) drug therapy: Secondary | ICD-10-CM | POA: Insufficient documentation

## 2018-08-06 DIAGNOSIS — G894 Chronic pain syndrome: Secondary | ICD-10-CM | POA: Diagnosis not present

## 2018-08-06 DIAGNOSIS — Z79891 Long term (current) use of opiate analgesic: Secondary | ICD-10-CM

## 2018-08-06 DIAGNOSIS — M7918 Myalgia, other site: Secondary | ICD-10-CM | POA: Diagnosis not present

## 2018-08-06 MED ORDER — HYDROCODONE-ACETAMINOPHEN 5-325 MG PO TABS: 1.0000 | ORAL_TABLET | Freq: Three times a day (TID) | ORAL | 0 refills | Status: DC | PRN

## 2018-08-06 MED ORDER — CYCLOBENZAPRINE HCL 10 MG PO TABS: 10.0000 mg | ORAL_TABLET | Freq: Three times a day (TID) | ORAL | 4 refills | Status: DC

## 2018-08-06 MED ORDER — FENTANYL 25 MCG/HR TD PT72: 25.0000 ug | MEDICATED_PATCH | TRANSDERMAL | 0 refills | Status: DC

## 2018-08-06 NOTE — Patient Instructions (Signed)
PLEASE FEEL FREE TO CALL OUR OFFICE WITH ANY PROBLEMS OR QUESTIONS ([phone])      

## 2018-08-06 NOTE — Progress Notes (Signed)
Procedure: Trigger point injection  Dx: myofascial pain, trigger points   Procedure report:  After informed consent and preparation of the skin with isopropyl alcohol, I injected the bilateral upper traps and mid lumbar paraspinals each (4 total) with 2cc of 1% lidocaine. The patient tolerated well, and no complications were experienced. Post-injection instructions were provided.     Additionally, I refilled fentanyl and hydrcodone rx'es today. She will continue to work on further taper.

## 2018-08-06 NOTE — Addendum Note (Signed)
Addended by: Alger Simons T on: 08/06/2018 01:03 PM   Modules accepted: Orders

## 2018-08-14 DIAGNOSIS — H40033 Anatomical narrow angle, bilateral: Secondary | ICD-10-CM | POA: Diagnosis not present

## 2018-08-14 DIAGNOSIS — H3589 Other specified retinal disorders: Secondary | ICD-10-CM | POA: Diagnosis not present

## 2018-08-14 DIAGNOSIS — H40013 Open angle with borderline findings, low risk, bilateral: Secondary | ICD-10-CM | POA: Diagnosis not present

## 2018-09-04 ENCOUNTER — Encounter: Payer: 59 | Admitting: Registered Nurse

## 2018-09-04 ENCOUNTER — Encounter: Payer: Self-pay | Admitting: Physical Medicine & Rehabilitation

## 2018-09-04 ENCOUNTER — Encounter: Payer: 59 | Attending: Physical Medicine and Rehabilitation | Admitting: Physical Medicine & Rehabilitation

## 2018-09-04 DIAGNOSIS — M797 Fibromyalgia: Secondary | ICD-10-CM

## 2018-09-04 DIAGNOSIS — M654 Radial styloid tenosynovitis [de Quervain]: Secondary | ICD-10-CM | POA: Diagnosis not present

## 2018-09-04 DIAGNOSIS — M4716 Other spondylosis with myelopathy, lumbar region: Secondary | ICD-10-CM | POA: Diagnosis present

## 2018-09-04 DIAGNOSIS — Z5181 Encounter for therapeutic drug level monitoring: Secondary | ICD-10-CM | POA: Insufficient documentation

## 2018-09-04 DIAGNOSIS — M47812 Spondylosis without myelopathy or radiculopathy, cervical region: Secondary | ICD-10-CM

## 2018-09-04 DIAGNOSIS — G43519 Persistent migraine aura without cerebral infarction, intractable, without status migrainosus: Secondary | ICD-10-CM | POA: Diagnosis present

## 2018-09-04 DIAGNOSIS — G894 Chronic pain syndrome: Secondary | ICD-10-CM | POA: Diagnosis not present

## 2018-09-04 DIAGNOSIS — M5136 Other intervertebral disc degeneration, lumbar region: Secondary | ICD-10-CM | POA: Insufficient documentation

## 2018-09-04 DIAGNOSIS — F0781 Postconcussional syndrome: Secondary | ICD-10-CM | POA: Diagnosis present

## 2018-09-04 DIAGNOSIS — Z79899 Other long term (current) drug therapy: Secondary | ICD-10-CM | POA: Insufficient documentation

## 2018-09-04 MED ORDER — FENTANYL 25 MCG/HR TD PT72: 25.0000 ug | MEDICATED_PATCH | TRANSDERMAL | 0 refills | Status: DC

## 2018-09-04 MED ORDER — HYDROCODONE-ACETAMINOPHEN 5-325 MG PO TABS: 1.0000 | ORAL_TABLET | Freq: Three times a day (TID) | ORAL | 0 refills | Status: DC | PRN

## 2018-09-04 NOTE — Addendum Note (Signed)
Addended by: Alger Simons T on: 09/04/2018 02:42 PM   Modules accepted: Orders, Level of Service

## 2018-09-04 NOTE — Progress Notes (Addendum)
   Right peri-tendon injection  Dx: Patty Salas Tenosynovitis RUE    After informed consent and preparation of the skin with betadine and isopropyl alcohol, I injected 6mg  (1cc) of celestone and 4cc of 1% lidocaine along APL and EPB tendons via anterior approach. Additionally, aspiration was performed prior to injection. The patient tolerated well, and no complications were encountered. Afterward the area was cleaned and dressed. Post- injection instructions were provided.    Refilled fentanyl today  She has reduced her hydrocodone use and didn't require refill today!

## 2018-09-04 NOTE — Patient Instructions (Signed)
PLEASE FEEL FREE TO CALL OUR OFFICE WITH ANY PROBLEMS OR QUESTIONS ([phone])      

## 2018-09-04 NOTE — Addendum Note (Signed)
Addended by: Caro Hight on: 09/04/2018 03:05 PM   Modules accepted: Orders

## 2018-10-01 ENCOUNTER — Other Ambulatory Visit: Payer: 59 | Admitting: Orthotics

## 2018-10-03 ENCOUNTER — Encounter: Payer: 59 | Attending: Physical Medicine and Rehabilitation | Admitting: Registered Nurse

## 2018-10-03 ENCOUNTER — Encounter: Payer: Self-pay | Admitting: Registered Nurse

## 2018-10-03 ENCOUNTER — Other Ambulatory Visit: Payer: Self-pay

## 2018-10-03 VITALS — BP 145/80 | HR 108 | Ht 65.0 in | Wt 154.0 lb

## 2018-10-03 DIAGNOSIS — Z79891 Long term (current) use of opiate analgesic: Secondary | ICD-10-CM

## 2018-10-03 DIAGNOSIS — F0781 Postconcussional syndrome: Secondary | ICD-10-CM | POA: Insufficient documentation

## 2018-10-03 DIAGNOSIS — Z79899 Other long term (current) drug therapy: Secondary | ICD-10-CM | POA: Insufficient documentation

## 2018-10-03 DIAGNOSIS — M4716 Other spondylosis with myelopathy, lumbar region: Secondary | ICD-10-CM

## 2018-10-03 DIAGNOSIS — M5416 Radiculopathy, lumbar region: Secondary | ICD-10-CM

## 2018-10-03 DIAGNOSIS — Z5181 Encounter for therapeutic drug level monitoring: Secondary | ICD-10-CM | POA: Diagnosis present

## 2018-10-03 DIAGNOSIS — M797 Fibromyalgia: Secondary | ICD-10-CM

## 2018-10-03 DIAGNOSIS — M542 Cervicalgia: Secondary | ICD-10-CM | POA: Diagnosis not present

## 2018-10-03 DIAGNOSIS — M51369 Other intervertebral disc degeneration, lumbar region without mention of lumbar back pain or lower extremity pain: Secondary | ICD-10-CM

## 2018-10-03 DIAGNOSIS — M5136 Other intervertebral disc degeneration, lumbar region: Secondary | ICD-10-CM | POA: Insufficient documentation

## 2018-10-03 DIAGNOSIS — M47812 Spondylosis without myelopathy or radiculopathy, cervical region: Secondary | ICD-10-CM

## 2018-10-03 DIAGNOSIS — M7061 Trochanteric bursitis, right hip: Secondary | ICD-10-CM

## 2018-10-03 DIAGNOSIS — G894 Chronic pain syndrome: Secondary | ICD-10-CM

## 2018-10-03 DIAGNOSIS — G43519 Persistent migraine aura without cerebral infarction, intractable, without status migrainosus: Secondary | ICD-10-CM | POA: Diagnosis present

## 2018-10-03 DIAGNOSIS — M546 Pain in thoracic spine: Secondary | ICD-10-CM

## 2018-10-03 DIAGNOSIS — G8929 Other chronic pain: Secondary | ICD-10-CM

## 2018-10-03 DIAGNOSIS — M5412 Radiculopathy, cervical region: Secondary | ICD-10-CM

## 2018-10-03 DIAGNOSIS — M7062 Trochanteric bursitis, left hip: Secondary | ICD-10-CM

## 2018-10-03 DIAGNOSIS — M7918 Myalgia, other site: Secondary | ICD-10-CM

## 2018-10-03 MED ORDER — FENTANYL 25 MCG/HR TD PT72: 25.0000 ug | MEDICATED_PATCH | TRANSDERMAL | 0 refills | Status: DC

## 2018-10-03 MED ORDER — HYDROCODONE-ACETAMINOPHEN 5-325 MG PO TABS: 1.0000 | ORAL_TABLET | Freq: Three times a day (TID) | ORAL | 0 refills | Status: DC | PRN

## 2018-10-03 NOTE — Progress Notes (Signed)
Subjective:    Patient ID: Patty Salas, female    DOB: Jan 01, 1956, 63 y.o.   MRN: [id number]  HPI: Patty Salas is a 63 y.o. female who returns for follow up appointment for chronic pain and medication refill. She states her  pain is located in her neck radiating into her bilateral shoulders, mid- lower back pain radiating into her lower extremities, bilateral hip pain and bilateral knee pain. She rates her pain 10. Hercurrent exercise regime is walking and performing stretching exercises.  Patty Salas is 75.00  MME. She is also prescribed Lorazepam by Patty Salas .We have discussed the black box warning of using opioids and benzodiazepines. I highlighted the dangers of using these drugs together and discussed the adverse events including respiratory suppression, overdose, cognitive impairment and importance of compliance with current regimen. We will continue to monitor and adjust as indicated.  She is being closely monitored and under the care of her psychiatrist Patty Salas.    Pain Inventory Average Pain 10 Pain Right Now 10 My pain is constant, sharp, burning, dull, stabbing and aching  In the last 24 hours, has pain interfered with the following? General activity 10 Relation with others 10 Enjoyment of life 10 What TIME of day is your pain at its worst? all Sleep (in general) Fair  Pain is worse with: walking, bending, sitting, inactivity, standing and some activites Pain improves with: rest, heat/ice, therapy/exercise, medication and injections Relief from Meds: 6  Mobility walk with assistance use a cane use a walker how many minutes can you walk? 5 ability to climb steps?  yes do you drive?  yes  Function disabled: date disabled n/a I need assistance with the following:  meal prep, household duties and shopping  Neuro/Psych bladder control problems bowel control problems weakness numbness tremor tingling trouble  walking spasms dizziness confusion depression anxiety  Prior Studies Any changes since last visit?  no  Physicians involved in your care Any changes since last visit?  yes   Family History  Problem Relation Age of Onset  . Cancer Mother        stomach  . Migraines Mother   . Arthritis Mother   . Emphysema Mother   . Heart disease Father   . Hypertension Father   . Cancer Father        colon  . Dementia Father   . Hypertension Brother   . Migraines Brother   . Hypertension Brother   . Migraines Brother   . Alcohol abuse Brother   . Bipolar disorder Brother   . Migraines Daughter   . Arthritis Maternal Grandmother   . Cancer Maternal Grandmother        stomach  . Diabetes Maternal Grandmother   . Stroke Maternal Grandmother   . Cancer Maternal Aunt        lung  . Emphysema Maternal Aunt   . Cancer Paternal Uncle        colon  . Hypertension Maternal Aunt   . Heart disease Maternal Aunt   . Cancer Paternal Uncle        lung   Social History   Socioeconomic History  . Marital status: Married    Spouse name: Not on file  . Number of children: 1  . Years of education: COLLEGE1  . Highest education level: Not on file  Occupational History  . Occupation: HOUSEWIFE    Employer: UNEMPLOYED  Social Needs  . Financial resource strain: Not on file  .  Food insecurity:    Worry: Not on file    Inability: Not on file  . Transportation needs:    Medical: Not on file    Non-medical: Not on file  Tobacco Use  . Smoking status: Never Smoker  . Smokeless tobacco: Never Used  Substance and Sexual Activity  . Alcohol use: No  . Drug use: No  . Sexual activity: Not on file  Lifestyle  . Physical activity:    Days per week: Not on file    Minutes per session: Not on file  . Stress: Not on file  Relationships  . Social connections:    Talks on phone: Not on file    Gets together: Not on file    Attends religious service: Not on file    Active member of club  or organization: Not on file    Attends meetings of clubs or organizations: Not on file    Relationship status: Not on file  Other Topics Concern  . Not on file  Social History Narrative   Patient is right handed.   Patient drinks caffeine occasionally.   Past Surgical History:  Procedure Laterality Date  .  kidney stones    . ABDOMINAL ADHESION SURGERY    . ABDOMINAL HYSTERECTOMY  1995   partial- hemmoraged after surgery-stitch came loose  . APPENDECTOMY  1993   hemmoraged after surgery- stitch came loose  . CERVICAL DISC SURGERY  06/04/2001   with fusion  . CHOLECYSTECTOMY  1985  . colonoscopy    . COLONOSCOPY    . CYSTOSCOPY    . FOOT SURGERY     lt  . jj stent  07/2002   with ureteroscopy, cystoscopy and then removal of stent in office  . LITHOTRIPSY    . LYMPH NODE BIOPSY Right 03/06/2014   Procedure: right groin LYMPH NODE BIOPSY;  Surgeon: Patty Roof, MD;  Location: [facility];  Service: General;  Laterality: Right;  . LYMPHADENECTOMY N/A 12/29/2015   Procedure: RETROPERITONEAL LYMPHADENECTOMY;  Surgeon: Patty Frock, MD;  Location: [facility];  Service: Urology;  Laterality: N/A;  . NECK SURGERY  2002  . ROBOT ASSISTED LAPAROSCOPIC NEPHRECTOMY Left 12/29/2015   Procedure: XI ROBOTIC ASSISTED LEFT LAPAROSCOPIC NEPHRECTOMY;  Surgeon: Patty Frock, MD;  Location: [facility];  Service: Urology;  Laterality: Left;  . TONSILLECTOMY  1976   Past Medical History:  Diagnosis Date  . Anxiety   . Arthritis   . Asthma   . Bipolar affective ([place])   . Calcifying tendinitis of shoulder   . Carpal tunnel syndrome   . Cervical facet syndrome   . Chronic pain syndrome   . Complication of anesthesia   . Depression   . Diverticulosis   . Falls   . Fibromyalgia   . 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 Full dentures   . Gout   . Headache(784.0)    migraines  . Herpesviral infection   . Hypertension   . IBS (irritable bowel  syndrome)    with diarrhea  . Lymphadenopathy   . Myalgia and myositis, unspecified   . PONV (postoperative nausea and vomiting)   . PTSD (post-traumatic stress disorder)   . Renal calculi   . Restless legs syndrome (RLS)   . Thoracic radiculopathy   . Vitamin D deficiency   . Wears glasses    BP (!) 145/80   Pulse (!) 108   Ht 5\' 5"  (1.651 m)  Wt 154 lb (69.9 kg)   SpO2 98%   BMI 25.63 kg/m   Opioid Risk Score:   Fall Risk Score:  `1  Depression screen PHQ 2/9  Depression screen [facility] 2/9 07/12/2018 02/05/2018 10/05/2017 09/04/2017 08/02/2017 07/05/2017 04/24/2017  Decreased Interest 1 1 1 1 1 3 2   Down, Depressed, Hopeless 1 1 1 1 1 3 2   PHQ - 2 Score 2 2 2 2 2 6 4   Altered sleeping - - - 1 - - -  Tired, decreased energy - - - 2 - - -  Change in appetite - - - 2 - - -  Feeling bad or failure about yourself  - - - 2 - - -  Trouble concentrating - - - 1 - - -  Moving slowly or fidgety/restless - - - 1 - - -  Suicidal thoughts - - - 0 - - -  PHQ-9 Score - - - 11 - - -  Difficult doing work/chores - - - Somewhat difficult - - -  Some recent data might be hidden     Review of Systems  Constitutional: Positive for diaphoresis and fever.  HENT: Negative.   Eyes: Negative.   Respiratory: Negative.   Cardiovascular: Negative.   Gastrointestinal: Positive for abdominal pain, constipation, diarrhea and nausea.  Endocrine: Negative.   Genitourinary: Negative.   Musculoskeletal: Negative.   Skin: Negative.   Allergic/Immunologic: Negative.   Neurological: Negative.   Hematological: Negative.   Psychiatric/Behavioral: Negative.   All other systems reviewed and are negative.      Objective:   Physical Exam Vitals signs and nursing note reviewed.  Constitutional:      Appearance: Normal appearance.  Neck:     Musculoskeletal: Normal range of motion and neck supple.  Cardiovascular:     Rate and Rhythm: Normal rate and regular rhythm.     Pulses: Normal pulses.      Heart sounds: Normal heart sounds.  Pulmonary:     Effort: Pulmonary effort is normal.     Breath sounds: Normal breath sounds.  Musculoskeletal:     Comments: Normal Muscle Bulk and Muscle Testing Reveals:  Upper Extremities: Full ROM and Muscle Strength 5/5 Bilateral AC Joint Tenderness  Thoracic and Lumbar Hypersensitivity Lower Extremities: Full ROM and Muscle Strength 5/5 Arises from Table with ease Narrow Based Gait   Skin:    General: Skin is warm and dry.  Neurological:     Mental Status: She is alert and oriented to person, place, and time.  Psychiatric:        Mood and Affect: Mood normal.        Behavior: Behavior normal.           Assessment & Plan:  1. History of fibromyalgia with myofascial pain and multiple trigger points.Continue with Heat and exercise Regime. Continue withcurrent medication regimen withgabapentin. 10/03/2018 2. Chronic migraine headaches.Continue to Monitor. S/P Botox.on 05/30/2017 10/03/2018 3. Midline Low Back Pain/ Lumbar Spondylosis/Lumbar degenerative disk disease, L4-5/ Lumbar Radiculitis::Continue with HEP and current medication regimen. Refilled: Fentanyl Patch 25 mcg one patch every three days #10 and Continue Hydrocodone 5/325 mg one tablet every 6 hours as needed #90. 10/03/2018. 4. 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 Urology Following. 10/03/2018. 5. 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 01/1022020 6. Cervicalgia/ Cervical RadiculitisCervical Spondylosis: Continuecurrent medication regiment withGabapentin. Continue with HEP and Continue to monitor. 10/03/2018 7. Muscle Spasm: Continuecurrent medication regiment withFlexeril as needed. 10/03/2018 8. Bluford Kaufmann  Tenosynovitis: S/P Right Tendon Injection with relief noted.  9. Bilateral Greater Trochanter Bursitis> Continue to alternate with ice and heat therapy. Continue HEP as Tolerated. Continue to monitor.   20 minutes of  face to face patient care time was spent during this visit. All questions were encouraged and answered.  Follow up in 1 month

## 2018-10-04 ENCOUNTER — Telehealth: Payer: Self-pay | Admitting: *Deleted

## 2018-10-04 DIAGNOSIS — M797 Fibromyalgia: Secondary | ICD-10-CM

## 2018-10-04 DIAGNOSIS — G894 Chronic pain syndrome: Secondary | ICD-10-CM

## 2018-10-04 DIAGNOSIS — M47812 Spondylosis without myelopathy or radiculopathy, cervical region: Secondary | ICD-10-CM

## 2018-10-04 DIAGNOSIS — M4716 Other spondylosis with myelopathy, lumbar region: Secondary | ICD-10-CM

## 2018-10-04 MED ORDER — FENTANYL 25 MCG/HR TD PT72: 25.0000 ug | MEDICATED_PATCH | TRANSDERMAL | 0 refills | Status: DC

## 2018-10-04 NOTE — Telephone Encounter (Signed)
Fentanyl prescription re-submitted per Pharmacist request.

## 2018-10-04 NOTE — Telephone Encounter (Signed)
[facility] pharmacy left a message stating that the electronic submission for Patty Salas's fentanyl  Was corrupted. They tried to fix the problem internally.  They were unable to resolve.  They are asking for [name], ANP to resubmit a new electronic script for her fentanyl patches as soon as possible so patient can pick up her meds on Monday

## 2018-10-08 ENCOUNTER — Other Ambulatory Visit: Payer: 59 | Admitting: Orthotics

## 2018-10-14 ENCOUNTER — Ambulatory Visit: Payer: 59 | Admitting: Physical Medicine & Rehabilitation

## 2018-10-31 ENCOUNTER — Telehealth: Payer: Self-pay | Admitting: Nurse Practitioner

## 2018-10-31 MED ORDER — PROMETHAZINE HCL 25 MG PO TABS: 25.0000 mg | ORAL_TABLET | Freq: Four times a day (QID) | ORAL | 0 refills | Status: DC | PRN

## 2018-10-31 NOTE — Telephone Encounter (Signed)
Patient calling to request new prescription for generic Phenergan 25 mg 1 tab every 6 hours if needed to be sent to [place] [phone].  She is requesting a 90 day supply.She was previously receiving from her PCP but has now changed to a different PCP Dr Eber [facility] at [address] who wants her to get this medication from [place] since it relates to our care. She has an appointment to see Dr Jannifer Franklin 04/10/2019. Please call to let her know.

## 2018-10-31 NOTE — Telephone Encounter (Signed)
Yes please refill she has nausea with her migraines

## 2018-10-31 NOTE — Addendum Note (Signed)
Addended by: Florian Buff C on: 10/31/2018 05:00 PM   Modules accepted: Orders

## 2018-10-31 NOTE — Telephone Encounter (Signed)
Per Daun Peacock, NP, promethazine refilled. Disp #90 with note to pharmacy: 90 day supply.

## 2018-11-05 ENCOUNTER — Encounter: Payer: Self-pay | Admitting: Family Medicine

## 2018-11-05 ENCOUNTER — Ambulatory Visit: Payer: 59 | Admitting: Family Medicine

## 2018-11-05 ENCOUNTER — Encounter: Payer: 59 | Admitting: Registered Nurse

## 2018-11-05 VITALS — BP 135/89 | HR 106 | Temp 98.9°F | Ht 65.0 in | Wt 150.0 lb

## 2018-11-05 DIAGNOSIS — J069 Acute upper respiratory infection, unspecified: Secondary | ICD-10-CM

## 2018-11-05 DIAGNOSIS — R6883 Chills (without fever): Secondary | ICD-10-CM | POA: Diagnosis not present

## 2018-11-05 DIAGNOSIS — R52 Pain, unspecified: Secondary | ICD-10-CM

## 2018-11-05 LAB — POCT INFLUENZA A/B
INFLUENZA B, POC: NEGATIVE
Influenza A, POC: NEGATIVE

## 2018-11-05 NOTE — Patient Instructions (Signed)
Naliya, I have not seen you since December 2018.  I am sorry I could not address all your concerns today however, I need to see you to discuss your chronic concerns.  If you have to make multiple 15-minute appointments to address all of your concerns, please do so.  I am happy to see you to address all of them.  Please follow your gastroenterologist diet that he has laid out for you when your IBS gets flared.

## 2018-11-05 NOTE — Progress Notes (Signed)
Acute Care Office visit  Assessment and plan:  1. Viral upper respiratory tract infection   2. Chills   3. Body aches     -Patient has completely reassuring exam today with no acute physical exam findings in the upper respiratory, lower respiratory or GI realm  - Supportive care and various OTC medications discussed in addition to any prescribed.  - Call or RTC if new symptoms, or if no improvement or worse over next several days.    - Avoid all foods that are spicy, fried, fatty, and dairy.  Continue eating plain ritz crackers, ginger ale, etc.  - Advised patient to let her GI tract settle down for a few days.  Orders Placed This Encounter  Procedures  . POCT Influenza A/B    Gross side effects, risk and benefits, and alternatives of medications discussed with patient.  Patient is aware that all medications have potential side effects and we are unable to predict every sideeffect or drug-drug interaction that may occur.  Expresses verbal understanding and consents to current therapy plan and treatment regiment.   Education and routine counseling performed. Handouts provided.  Anticipatory guidance and routine counseling done re: condition, txmnt options and need for follow up. All questions of patient's were answered.  Return if symptoms worsen or fail to improve, for Follow-up chronic care as previously discussed.  Please see AVS handed out to patient at the end of our visit for additional patient instructions/ counseling done pertaining to today's office visit.  Note:  This document was partially repared using Dragon voice recognition software and may include unintentional dictation errors.  This document serves as a record of services personally performed by Mellody Dance, DO. It was created on her behalf by Toni Amend, a trained medical scribe. The creation of this record is based on the scribe's personal observations and the provider's statements to  them.   I have reviewed the above medical documentation for accuracy and completeness and I concur.  Mellody Dance, DO 11/05/2018 5:25 PM       Subjective:    Chief Complaint  Patient presents with  . Emesis  . Cough    sneezing, post nasal drianage, chills    HPI:  Pt presents with Sx for 3 days.  Her symptoms started on Saturday morning, hitting her all of a sudden.  States she was exposed to the flu.  Her husband had it (it was not officially confirmed or tested).  "I didn't really think anything about it, was coughing and sneezing and sounded like he had a chest cold, sort of."  Says "I just feel like [I have the flu]."  "It's not like the fibromyalgia, it's worse."   C/o:  Has been experiencing vomiting, diarrhea at times, overall headache, aches and pains.  Has had chills.  Had to pull over on the way over here because she threw up and "got soaking wet all over."  She vomits easily with migraines, and carries bags with her in the car.  States that she's having sharp pain in her abdomen.  Denies:  Isn't sure if she's had a fever.    For symptoms patient has tried:  Took a Norco and one 500 mg tylenol an hour ago.  Overall getting:  States she threw up on the way over here.  Has been eating plain rtiz crackers, ginger ale, and a little bit of water.  Says the juices are too acidic for her to tolerate.  Patient Care Team    Relationship Specialty Notifications Start End  Mellody Dance, DO PCP - General Family Medicine  08/10/17   Richmond Campbell, MD Consulting Physician Gastroenterology  11/11/14   Festus Aloe, MD Consulting Physician Urology  12/07/15   Alexis Frock, MD Consulting Physician Urology  12/07/15   Bayard Hugger, NP Nurse Practitioner Physical Medicine and Rehabilitation  08/10/17   Meredith Staggers, MD Consulting Physician Physical Medicine and Rehabilitation  08/10/17    Comment: Pain specialist.  Is where pt gets her narcotics from    Kathrynn Ducking, MD Consulting Physician Neurology  08/10/17   Wallene Huh, [place] Consulting Physician Podiatry  08/28/17   Ricard Dillon, MD  Psychiatry  08/28/17    Comment: Treats patient for her bipolar, generalized anxiety disorder etc.  Thea Gist  Physical Medicine and Rehabilitation  08/28/17   Sueanne Margarita, MD Consulting Physician Cardiology  08/28/17    Comment: per pt- she had been seen by her int he past.  I do not see those OV notes though.   Newt Minion, MD Consulting Physician Orthopedic Surgery  08/28/17   Calton Dach, MD Referring Physician Optometry  08/28/17   Heath Lark, MD Consulting Physician Hematology and Oncology  08/28/17     Past medical history, Surgical history, Family history reviewed and noted below, Social history, Allergies, and Medications have been entered into the medical record, reviewed and changed as needed.   Allergies  Allergen Reactions  . Cymbalta [Duloxetine Hcl] Anaphylaxis    Suicidal if in combination w/ Axert  . Divalproex Sodium Anaphylaxis  . Duract [Bromfenac] Anaphylaxis  . Hydrochlorothiazide Anaphylaxis    Pt loses facial movement uncontrolled;   . Imitrex [Sumatriptan Succinate] Anaphylaxis  . Latex Anaphylaxis  . Mellaril Anaphylaxis  . Olanzapine Anaphylaxis  . Penicillins Anaphylaxis  . Topamax Anaphylaxis and Other (See Comments)    Confusion, tremor, blurred vision  . Aripiprazole Other (See Comments)    Stiffened muscles  . Aspirin Hives and Itching  . Dalmane [Flurazepam Hcl] Other (See Comments)    Stiffened all muscles  . Darifenacin Hydrobromide Er Hives    Hives on back and looked like sunburn  . Metoclopramide Hcl Other (See Comments)    headache  . Seroquel [Quetiapine Fumerate] Other (See Comments)    extreme fatigue and bad dreams  . Statins Hives  . Stelazine Other (See Comments)    Stiffened muscles  . Thorazine [Chlorpromazine Hcl] Other (See Comments)     Stiffened muscles  . Abilify [Aripiprazole]     Stiffened muscles, extrapyramidal effects all over body.   . Alka-Seltzer [Aspirin Effervescent]     Mouth ulcers, swallowing problems.   . Allopurinol Hives  . Biofreeze [Menthol (Topical Analgesic)] Hives and Itching  . Capzasin [Capsaicin] Itching    Redness   . Clove Oil   . Colchicine Hives  . Cough Drops [Benzocaine]     orthicoat sprays containing menthol or lemon flavor--breaks inside of mouth out, red rash, mouth ulcers.   . Diflucan [Fluconazole] Hives and Itching  . Garlic   . Iohexol      Code: HIVES, Desc: pt broke out in red rash and hives after CT injection on 12/07/09.-pt needs 13 hr prep kit, Onset Date: [id number]   . Ivp Dye [Iodinated Diagnostic Agents]   . Lyrica [Pregabalin]   . Metamucil [Psyllium]     Mouth ulcers, swallowing problems.  Merril Abbe [Polyethylene Glycol] Nausea And  Vomiting  . Myrbetriq [Mirabegron]     swelling  . Nsaids     Mouth ulcers, swelling of mouth.   . Other Itching and Other (See Comments)    Dust, mold, feathers, wool, flannel, cologne, chlorox, household cleaning products, scented candles, cinnamon, ivory soap, paints, sun sentitive, acidic fruits, ( lemons, limes, strawbery, tartrazine yellow#5 and 6 in foods, MSG food additives, sudiium lauryl sulates, hot peppers, spearmint, wintergreen peppermint, mentol, orange, jalapenos. ---headache, breathing, welps, canker sores inside mouth, uticaria  . Potassium-Containing Compounds     Undiluted through IV. --Pain.   . Prednisone     No  Sleep at night.   . Reglan [Metoclopramide]     Headaches.   . Renografin [Diatrizoate]     welps   . Risperdal [Risperidone] Other (See Comments)    Too sedating.   Marland Kitchen Seroquel [Quetiapine Fumarate]     Bad dreams, too sedating.  . Simvastatin Other (See Comments)    Paranoid affect, muscle spasms.   Marland Kitchen Urocit - K [Potassium Citrate]   . Zyprexa [Olanzapine]   . Avelox [Moxifloxacin Hcl In Nacl]  Nausea And Vomiting  . Barium-Containing Compounds Rash  . Diclofenac Rash  . Doxycycline Nausea Only    Stomach upset.   Marland Kitchen E-Mycin [Erythromycin Base] Nausea Only  . Fiorinal [Butalbital-Aspirin-Caffeine] Itching and Rash    Welps,  . Flagyl [Metronidazole Hcl] Nausea And Vomiting  . Lamictal [Lamotrigine] Rash  . Pregabalin     Blurry vision, muscle stiffness   . Risperidone Other (See Comments)    Too sedating  . Toradol [Ketorolac Tromethamine] Itching and Rash    Review of Systems: - see above HPI for pertinent positives General:   No F/C, wt loss Pulm:   No DIB, pleuritic chest pain Card:  No CP, palpitations Abd:  No n/v/d or pain Ext:  No inc edema from baseline   Objective:   Blood pressure 135/89, pulse (!) 106, temperature 98.9 F (37.2 C), height 5' 5"  (1.651 m), weight 150 lb (68 kg), SpO2 96 %. Body mass index is 24.96 kg/m. General: Well Developed, well nourished, appropriate for stated age.  Neuro: Alert and oriented x3, extra-ocular muscles intact, sensation grossly intact.  HEENT: Normocephalic, atraumatic, pupils equal round reactive to light, neck supple, no masses, no painful lymphadenopathy, TM's intact B/L, no acute findings.  Nares:  patent, clear d/c, OP- clear, mild erythema, No TTP sinuses Skin: Warm and dry, no gross rash. Cardiac: RRR, S1 S2,  no murmurs rubs or gallops.  Respiratory: ECTA B/L and A/P, Not using accessory muscles, speaking in full sentences- unlabored. Vascular:  No gross lower ext edema, cap RF less 2 sec. Psych: No HI/SI, judgement and insight good, Euthymic mood. Full Affect.

## 2018-11-07 ENCOUNTER — Encounter: Payer: 59 | Admitting: Registered Nurse

## 2018-11-11 ENCOUNTER — Ambulatory Visit: Payer: 59 | Admitting: Family Medicine

## 2018-11-11 ENCOUNTER — Encounter: Payer: 59 | Admitting: Registered Nurse

## 2018-11-11 ENCOUNTER — Telehealth: Payer: Self-pay | Admitting: *Deleted

## 2018-11-11 DIAGNOSIS — M4716 Other spondylosis with myelopathy, lumbar region: Secondary | ICD-10-CM

## 2018-11-11 DIAGNOSIS — M797 Fibromyalgia: Secondary | ICD-10-CM

## 2018-11-11 DIAGNOSIS — G894 Chronic pain syndrome: Secondary | ICD-10-CM

## 2018-11-11 DIAGNOSIS — M47812 Spondylosis without myelopathy or radiculopathy, cervical region: Secondary | ICD-10-CM

## 2018-11-11 MED ORDER — HYDROCODONE-ACETAMINOPHEN 5-325 MG PO TABS: 1.0000 | ORAL_TABLET | Freq: Three times a day (TID) | ORAL | 0 refills | Status: DC | PRN

## 2018-11-11 MED ORDER — FENTANYL 25 MCG/HR TD PT72: 1.0000 | MEDICATED_PATCH | TRANSDERMAL | 0 refills | Status: DC

## 2018-11-11 NOTE — Telephone Encounter (Signed)
PMP was reviewed, last prescriptions were filled on 10/07/2018. Fentanyl and Hydrocodone e-scribed. Placed a call to Ms. Bene she is aware of the above, she has an appointment with her PCP today, she states.

## 2018-11-11 NOTE — Telephone Encounter (Signed)
Patient left a message stating she had to cancel her appointment due to upper respiratory infection.  She states she has a low grade fever, persistent cough and sneezing. Does not want to jeopardize the staff.  She is asking for her meds to be sent to her pharmacy without office visit, just this one time.

## 2018-11-12 ENCOUNTER — Encounter: Payer: Self-pay | Admitting: Family Medicine

## 2018-11-12 ENCOUNTER — Ambulatory Visit: Payer: 59 | Admitting: Family Medicine

## 2018-11-12 VITALS — BP 140/80 | HR 100 | Temp 99.2°F | Ht 65.0 in | Wt 148.0 lb

## 2018-11-12 DIAGNOSIS — J310 Chronic rhinitis: Secondary | ICD-10-CM

## 2018-11-12 DIAGNOSIS — R05 Cough: Secondary | ICD-10-CM | POA: Diagnosis not present

## 2018-11-12 DIAGNOSIS — J309 Allergic rhinitis, unspecified: Secondary | ICD-10-CM

## 2018-11-12 DIAGNOSIS — J329 Chronic sinusitis, unspecified: Secondary | ICD-10-CM

## 2018-11-12 DIAGNOSIS — R059 Cough, unspecified: Secondary | ICD-10-CM

## 2018-11-12 DIAGNOSIS — R0982 Postnasal drip: Secondary | ICD-10-CM

## 2018-11-12 MED ORDER — CLINDAMYCIN HCL 300 MG PO CAPS: 300.0000 mg | ORAL_CAPSULE | Freq: Three times a day (TID) | ORAL | 0 refills | Status: DC

## 2018-11-12 MED ORDER — FEXOFENADINE HCL 180 MG PO TABS: 180.0000 mg | ORAL_TABLET | Freq: Every day | ORAL | 0 refills | Status: DC

## 2018-11-12 MED ORDER — METHYLPREDNISOLONE ACETATE 40 MG/ML IJ SUSP
40.0000 mg | Freq: Once | INTRAMUSCULAR | Status: AC
  Administered 2018-11-12: 40 mg via INTRAMUSCULAR

## 2018-11-12 MED ORDER — DEXAMETHASONE SODIUM PHOSPHATE 4 MG/ML IJ SOLN
4.0000 mg | Freq: Once | INTRAMUSCULAR | Status: AC
  Administered 2018-11-12: 4 mg via INTRAMUSCULAR

## 2018-11-12 NOTE — Progress Notes (Signed)
Acute Care Office visit  Assessment and plan:  1. Rhinosinusitis   2. Cough   3. Allergic rhinitis with postnasal drip     - Viral vs Allergic vs Bacterial causes for pt's symptoms reveiwed.    - Extensive education provided.  - Supportive care and various OTC medications discussed in addition to any prescribed.  - Steroid injection recommended and provided today. - Clindamycin prescribed.  Per patient, has taken this in the past and done well with it.  - Advised the patient to begin using AYR or Neilmed sinus rinses BID.  Advised that the patient may also incorporate allegra or claritin PRN.   - Call or RTC if new symptoms, or if no improvement or worse over next several days.      Meds ordered this encounter  Medications  . clindamycin (CLEOCIN) 300 MG capsule    Sig: Take 1 capsule (300 mg total) by mouth 3 (three) times daily.    Dispense:  21 capsule    Refill:  0  . fexofenadine (ALLEGRA) 180 MG tablet    Sig: Take 1 tablet (180 mg total) by mouth daily.    Dispense:  30 tablet    Refill:  0  . methylPREDNISolone acetate (DEPO-MEDROL) injection 40 mg  . dexamethasone (DECADRON) injection 4 mg    Medications Discontinued During This Encounter  Medication Reason  . PROAIR HFA 108 (90 BASE) MCG/ACT inhaler      Gross side effects, risk and benefits, and alternatives of medications discussed with patient.  Patient is aware that all medications have potential side effects and we are unable to predict every sideeffect or drug-drug interaction that may occur.  Expresses verbal understanding and consents to current therapy plan and treatment regiment.   Education and routine counseling performed. Handouts provided.  Anticipatory guidance and routine counseling done re: condition, txmnt options and need for follow up. All questions of patient's were answered.  Return if symptoms worsen or fail to improve.  Please see AVS handed out to patient at the end of our  visit for additional patient instructions/ counseling done pertaining to today's office visit.  Note:  This document was partially repared using Dragon voice recognition software and may include unintentional dictation errors.  This document serves as a record of services personally performed by Mellody Dance, DO. It was created on her behalf by Toni Amend, a trained medical scribe. The creation of this record is based on the scribe's personal observations and the provider's statements to them.   I have reviewed the above medical documentation for accuracy and completeness and I concur.  Mellody Dance, DO 11/13/2018 9:51 PM       Subjective:    Chief Complaint  Patient presents with  . Cough    HPI:  Pt presents with Sx for 2-3 days   C/o:  Has been coughing for 2-3 days, "now down in my chest."  Experiencing yellow discharge from her nose.  Feels she has been running a fever.  States her highest temperature at home was "99-something, with Norco."  Feels her chest congestion/chest pain related to "bronchitis congestion" was less when she was on her pain meds, mentioning Fentanyl.  States that her sick contact is her husband, who doesn't go to the doctor, and doesn't cover his mouth.  Denies:  Says she feels wheezy and short of breath, worse while lying down.    For symptoms patient has tried:  Using robitussin DM, Norco, Albuterol.  Takes benadryl for runny nose and head congestion sometimes.  Says she's been pushing water, Ginger Ale, and crackers.  Overall getting:  Feels her symptoms are getting worse, and her yellow discharge is new.   Patient Care Team    Relationship Specialty Notifications Start End  Mellody Dance, DO PCP - General Family Medicine  08/10/17   Richmond Campbell, MD Consulting Physician Gastroenterology  11/11/14   Festus Aloe, MD Consulting Physician Urology  12/07/15   Alexis Frock, MD Consulting Physician Urology  12/07/15     Bayard Hugger, NP Nurse Practitioner Physical Medicine and Rehabilitation  08/10/17   Meredith Staggers, MD Consulting Physician Physical Medicine and Rehabilitation  08/10/17    Comment: Pain specialist.  Is where pt gets her narcotics from  Kathrynn Ducking, MD Consulting Physician Neurology  08/10/17   Wallene Huh, [place] Consulting Physician Podiatry  08/28/17   Ricard Dillon, MD  Psychiatry  08/28/17    Comment: Treats patient for her bipolar, generalized anxiety disorder etc.  Thea Gist  Physical Medicine and Rehabilitation  08/28/17   Sueanne Margarita, MD Consulting Physician Cardiology  08/28/17    Comment: per pt- she had been seen by her int he past.  I do not see those OV notes though.   Newt Minion, MD Consulting Physician Orthopedic Surgery  08/28/17   Calton Dach, MD Referring Physician Optometry  08/28/17   Heath Lark, MD Consulting Physician Hematology and Oncology  08/28/17     Past medical history, Surgical history, Family history reviewed and noted below, Social history, Allergies, and Medications have been entered into the medical record, reviewed and changed as needed.   Allergies  Allergen Reactions  . Cymbalta [Duloxetine Hcl] Anaphylaxis    Suicidal if in combination w/ Axert  . Divalproex Sodium Anaphylaxis  . Duract [Bromfenac] Anaphylaxis  . Hydrochlorothiazide Anaphylaxis    Pt loses facial movement uncontrolled;   . Imitrex [Sumatriptan Succinate] Anaphylaxis  . Latex Anaphylaxis  . Mellaril Anaphylaxis  . Olanzapine Anaphylaxis  . Penicillins Anaphylaxis  . Topamax Anaphylaxis and Other (See Comments)    Confusion, tremor, blurred vision  . Aripiprazole Other (See Comments)    Stiffened muscles  . Aspirin Hives and Itching  . Dalmane [Flurazepam Hcl] Other (See Comments)    Stiffened all muscles  . Darifenacin Hydrobromide Er Hives    Hives on back and looked like sunburn  . Metoclopramide Hcl Other (See Comments)     headache  . Seroquel [Quetiapine Fumerate] Other (See Comments)    extreme fatigue and bad dreams  . Statins Hives  . Stelazine Other (See Comments)    Stiffened muscles  . Thorazine [Chlorpromazine Hcl] Other (See Comments)    Stiffened muscles  . Abilify [Aripiprazole]     Stiffened muscles, extrapyramidal effects all over body.   . Alka-Seltzer [Aspirin Effervescent]     Mouth ulcers, swallowing problems.   . Allopurinol Hives  . Biofreeze [Menthol (Topical Analgesic)] Hives and Itching  . Capzasin [Capsaicin] Itching    Redness   . Clove Oil   . Colchicine Hives  . Cough Drops [Benzocaine]     orthicoat sprays containing menthol or lemon flavor--breaks inside of mouth out, red rash, mouth ulcers.   . Diflucan [Fluconazole] Hives and Itching  . Garlic   . Iohexol      Code: HIVES, Desc: pt broke out in red rash and hives after CT injection on 12/07/09.-pt needs 13 hr prep  kit, Onset Date: [id number]   . Ivp Dye [Iodinated Diagnostic Agents]   . Lyrica [Pregabalin]   . Metamucil [Psyllium]     Mouth ulcers, swallowing problems.  Merril Abbe [Polyethylene Glycol] Nausea And Vomiting  . Myrbetriq [Mirabegron]     swelling  . Nsaids     Mouth ulcers, swelling of mouth.   . Other Itching and Other (See Comments)    Dust, mold, feathers, wool, flannel, cologne, chlorox, household cleaning products, scented candles, cinnamon, ivory soap, paints, sun sentitive, acidic fruits, ( lemons, limes, strawbery, tartrazine yellow#5 and 6 in foods, MSG food additives, sudiium lauryl sulates, hot peppers, spearmint, wintergreen peppermint, mentol, orange, jalapenos. ---headache, breathing, welps, canker sores inside mouth, uticaria  . Potassium-Containing Compounds     Undiluted through IV. --Pain.   . Prednisone     No  Sleep at night.   . Reglan [Metoclopramide]     Headaches.   . Renografin [Diatrizoate]     welps   . Risperdal [Risperidone] Other (See Comments)    Too sedating.   Marland Kitchen  Seroquel [Quetiapine Fumarate]     Bad dreams, too sedating.  . Simvastatin Other (See Comments)    Paranoid affect, muscle spasms.   Marland Kitchen Urocit - K [Potassium Citrate]   . Zyprexa [Olanzapine]   . Avelox [Moxifloxacin Hcl In Nacl] Nausea And Vomiting  . Barium-Containing Compounds Rash  . Diclofenac Rash  . Doxycycline Nausea Only    Stomach upset.   Marland Kitchen E-Mycin [Erythromycin Base] Nausea Only  . Fiorinal [Butalbital-Aspirin-Caffeine] Itching and Rash    Welps,  . Flagyl [Metronidazole Hcl] Nausea And Vomiting  . Lamictal [Lamotrigine] Rash  . Pregabalin     Blurry vision, muscle stiffness   . Risperidone Other (See Comments)    Too sedating  . Toradol [Ketorolac Tromethamine] Itching and Rash    Review of Systems: - see above HPI for pertinent positives General:   No F/C, wt loss Pulm:   No DIB, pleuritic chest pain Card:  No CP, palpitations Abd:  No n/v/d or pain Ext:  No inc edema from baseline   Objective:   Blood pressure 140/80, pulse 100, temperature 99.2 F (37.3 C), height 5' 5"  (1.651 m), weight 148 lb (67.1 kg), SpO2 96 %. Body mass index is 24.63 kg/m. General: Well Developed, well nourished, appropriate for stated age.  Neuro: Alert and oriented x3, extra-ocular muscles intact, sensation grossly intact.  HEENT: Normocephalic, atraumatic, pupils equal round reactive to light, neck supple, no masses, no painful lymphadenopathy, TM's intact B/L, no acute findings. Nares- patent, clear d/c, OP- clear, mild erythema, No TTP sinuses Skin: Warm and dry, no gross rash. Cardiac: RRR, S1 S2,  no murmurs rubs or gallops.  Respiratory: ECTA B/L and A/P, Not using accessory muscles, speaking in full sentences- unlabored. Vascular:  No gross lower ext edema, cap RF less 2 sec. Psych: No HI/SI, judgement and insight good, Euthymic mood. Full Affect.

## 2018-11-12 NOTE — Patient Instructions (Signed)
Please do sinus rinses of a Y are or Milta Deiters med sinus rinses twice daily.  You will do half a bottle in one side of your nostril and half a bottle and the other in the morning as well as in the evening.  This is very important to helping keep your sinuses clear   Since you said that you were taking clindamycin tolerating well in the past we will do a trial of that since you have had upper respiratory symptoms for 14 days or so now.   Upper Respiratory Infection, Adult An upper respiratory infection (URI) is a common viral infection of the nose, throat, and upper air passages that lead to the lungs. The most common type of URI is the common cold. URIs usually get better on their own, without medical treatment. What are the causes? A URI is caused by a virus. You may catch a virus by:  Breathing in droplets from an infected person's cough or sneeze.  Touching something that has been exposed to the virus (contaminated) and then touching your mouth, nose, or eyes. What increases the risk? You are more likely to get a URI if:  You are very young or very old.  It is autumn or winter.  You have close contact with others, such as at a daycare, school, or health care facility.  You smoke.  You have long-term (chronic) heart or lung disease.  You have a weakened disease-fighting (immune) system.  You have nasal allergies or asthma.  You are experiencing a lot of stress.  You work in an area that has poor air circulation.  You have poor nutrition. What are the signs or symptoms? A URI usually involves some of the following symptoms:  Runny or stuffy (congested) nose.  Sneezing.  Cough.  Sore throat.  Headache.  Fatigue.  Fever.  Loss of appetite.  Pain in your forehead, behind your eyes, and over your cheekbones (sinus pain).  Muscle aches.  Redness or irritation of the eyes.  Pressure in the ears or face. How is this diagnosed? This condition may be diagnosed based  on your medical history and symptoms, and a physical exam. Your health care provider may use a cotton swab to take a mucus sample from your nose (nasal swab). This sample can be tested to determine what virus is causing the illness. How is this treated? URIs usually get better on their own within 7-10 days. You can take steps at home to relieve your symptoms. Medicines cannot cure URIs, but your health care provider may recommend certain medicines to help relieve symptoms, such as:  Over-the-counter cold medicines.  Cough suppressants. Coughing is a type of defense against infection that helps to clear the respiratory system, so take these medicines only as recommended by your health care provider.  Fever-reducing medicines. Follow these instructions at home: Activity  Rest as needed.  If you have a fever, stay home from work or school until your fever is gone or until your health care provider says you are no longer contagious. Your health care provider may have you wear a face mask to prevent your infection from spreading. Relieving symptoms  Gargle with a salt-water mixture 3-4 times a day or as needed. To make a salt-water mixture, completely dissolve -1 tsp of salt in 1 cup of warm water.  Use a cool-mist humidifier to add moisture to the air. This can help you breathe more easily. Eating and drinking   Drink enough fluid to keep your  urine pale yellow.  Eat soups and other clear broths. General instructions   Take over-the-counter and prescription medicines only as told by your health care provider. These include cold medicines, fever reducers, and cough suppressants.  Do not use any products that contain nicotine or tobacco, such as cigarettes and e-cigarettes. If you need help quitting, ask your health care provider.  Stay away from secondhand smoke.  Stay up to date on all immunizations, including the yearly (annual) flu vaccine.  Keep all follow-up visits as told by  your health care provider. This is important. How to prevent the spread of infection to others   URIs can be passed from person to person (are contagious). To prevent the infection from spreading: ? Wash your hands often with soap and water. If soap and water are not available, use hand sanitizer. ? Avoid touching your mouth, face, eyes, or nose. ? Cough or sneeze into a tissue or your sleeve or elbow instead of into your hand or into the air. Contact a health care provider if:  You are getting worse instead of better.  You have a fever or chills.  Your mucus is brown or red.  You have yellow or brown discharge coming from your nose.  You have pain in your face, especially when you bend forward.  You have swollen neck glands.  You have pain while swallowing.  You have white areas in the back of your throat. Get help right away if:  You have shortness of breath that gets worse.  You have severe or persistent: ? Headache. ? Ear pain. ? Sinus pain. ? Chest pain.  You have chronic lung disease along with any of the following: ? Wheezing. ? Prolonged cough. ? Coughing up blood. ? A change in your usual mucus.  You have a stiff neck.  You have changes in your: ? Vision. ? Hearing. ? Thinking. ? Mood. Summary  An upper respiratory infection (URI) is a common infection of the nose, throat, and upper air passages that lead to the lungs.  A URI is caused by a virus.  URIs usually get better on their own within 7-10 days.  Medicines cannot cure URIs, but your health care provider may recommend certain medicines to help relieve symptoms. This information is not intended to replace advice given to you by your health care provider. Make sure you discuss any questions you have with your health care provider. Document Released: 03/14/2001 Document Revised: 05/04/2017 Document Reviewed: 05/04/2017 Elsevier Interactive Patient Education  2019 [employer].

## 2018-11-15 DIAGNOSIS — N200 Calculus of kidney: Secondary | ICD-10-CM | POA: Diagnosis not present

## 2018-11-18 ENCOUNTER — Telehealth: Payer: Self-pay | Admitting: Family Medicine

## 2018-11-18 ENCOUNTER — Telehealth: Payer: Self-pay | Admitting: Podiatry

## 2018-11-18 NOTE — Telephone Encounter (Signed)
Pt left message for me to please call her back.  I returned call and she wanted to make sure we had not discarded her orthotics that she is still wanting to get them but has had other health issues going on and when they are taken care of she will call to schedule an appt come in to pick them up.

## 2018-11-18 NOTE — Telephone Encounter (Signed)
Patient is requesting a call back from clinic staff about the clindamycin that she was prescribed and some non urgent side effects that she thinks she is having (loose stool, heartburn). Please contact patient when available

## 2018-11-19 DIAGNOSIS — Z8553 Personal history of malignant neoplasm of renal pelvis: Secondary | ICD-10-CM | POA: Diagnosis not present

## 2018-11-19 DIAGNOSIS — C642 Malignant neoplasm of left kidney, except renal pelvis: Secondary | ICD-10-CM | POA: Diagnosis not present

## 2018-11-20 NOTE — Telephone Encounter (Signed)
Spoke to patient and she states that she stopped the Clindamycin Friday due to bad reflux and loose stools.  Patient has appointment to follow up tomorrow. MPulliam, CMA/RT(R)

## 2018-11-21 ENCOUNTER — Encounter: Payer: Self-pay | Admitting: Family Medicine

## 2018-11-21 ENCOUNTER — Ambulatory Visit: Payer: 59 | Admitting: Family Medicine

## 2018-11-21 VITALS — BP 139/79 | HR 101 | Temp 98.6°F | Ht 65.0 in | Wt 153.5 lb

## 2018-11-21 DIAGNOSIS — F3162 Bipolar disorder, current episode mixed, moderate: Secondary | ICD-10-CM | POA: Diagnosis not present

## 2018-11-21 DIAGNOSIS — C649 Malignant neoplasm of unspecified kidney, except renal pelvis: Secondary | ICD-10-CM | POA: Diagnosis not present

## 2018-11-21 DIAGNOSIS — I10 Essential (primary) hypertension: Secondary | ICD-10-CM

## 2018-11-21 DIAGNOSIS — C8206 Follicular lymphoma grade I, intrapelvic lymph nodes: Secondary | ICD-10-CM | POA: Diagnosis not present

## 2018-11-21 NOTE — Progress Notes (Signed)
Impression and Recommendations:    1. Essential hypertension   2. 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 Bipolar 1 disorder, mixed, moderate (HCC) Chronic  4. Renal cell carcinoma, unspecified laterality (HCC)     Rhinosinusitis:  -Patient symptoms vastly improving since last visit.  -Patient to discontinue use of clindamycin due to severe GI symptoms (flare of her IBS causing diarrhea and new onset GERD).  - asked cma to add this ABX to med list as S-E   HTN:  Patient BP stable - Pulse recheck by myself in the office at 90.     H/o Renal Cell Carcinoma  -Hx of prior kidney cancer followed by Urologist- Dr Tresa Moore-  per pt.  ( I don't have records )  -Follow up with Urologists as scheduled.     H/o Follicular lymphoma, grade 1 of intrapelvic lymph nodes:   -Last appointment with Medical Oncologist, Dr. Alvy Bimler on 03/30/2016 with poor follow up compliance due to patient having severe GI symptoms or migraines.     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 Marlou Starks took out a lymph node in the right groin    01/01/2014 Imaging    Ct showed lymphadenopathy    01/26/2014 PET scan    Hypermetabolic retroperitoneal, periportal, and mesenteric lymph nodes is concerning for lymphoproliferative process.    03/06/2014 Pathology Results    Accession: [id number] right groin LN biopsy came back benign    06/02/2014 PET scan    Similar appearance of hypermetabolic retroperitoneal, periportal and mesenteric lymph nodes. Low grade lymphoproliferative disorder cannot be excluded.    12/01/2014 Imaging    Mildly enlarged upper abdominal/ retroperitoneal lymph nodes, measuring up to 13 mm, stable versus minimally increased. No evidence of splenomegaly.    12/29/2015 Pathology Results    Accession: [id number] LN biopsy of periaortic region was positive for low grade folliculr lymphoma. Leftf kidney specimen  showed renal cell carcinoma    12/29/2015 Surgery    She underwent robotic-assisted laparoscopic left radical nephrectomy. Left retroperitoneal lymph node dissection.    03/06/2016 PET scan    PET Ct showed stable lymphadenopathy with no major change     Follow up for CPE and fasting blood work in the near future.   Expresses verbal understanding and consents to current therapy and treatment regimen.  No barriers to understanding were identified.  Red flag symptoms and signs discussed in detail.  Patient expressed understanding regarding what to do in case of emergency\urgent symptoms  Please see AVS handed out to patient at the end of our visit for further patient instructions/ counseling done pertaining to today's office visit.   Return for come in for a CPE and FBW near future at your convenience.     Note:  This note was prepared with assistance of Dragon voice recognition software. Occasional wrong-word or sound-a-like substitutions may have occurred due to the inherent limitations of voice recognition software.   This document serves as a record of services personally performed by Mellody Dance, DO. It was created on her behalf by Steva Colder, a trained medical scribe. The creation of this record is based on the scribe's personal observations and the provider's statements to them.   I have reviewed the above medical documentation for accuracy and completeness and I concur.  Mellody Dance, DO 11/21/2018 4:46 PM       --------------------------------------------------------------------------------------------------------------------------------------------------------------------------------------------------------------------------------------------  Subjective:     HPI: Patty Salas is a 63 y.o. female who presents to [facility] today for issues as discussed below.  Her symptoms have vastly improved since she started taking the meds  that were prescribed on her last visits. She has been gargling salt water and completing the saltwater rinses. However, she was unable to take clindamycin due to adverse effects (severe GI symptoms). She has a hx of IBS and she notes that her abdominal symptoms are feeling better now. She reports chills (from prednisone). She denies ear pain and any other symptoms. She notes that she can take Cipro and levaquin.   She completes vertigo exercises, however, she hasn't started walking yet.   She was on lasix before prescribed by a former provider.     Wt Readings from Last 3 Encounters:  11/21/18 153 lb 8 oz (69.6 kg)  11/12/18 148 lb (67.1 kg)  11/05/18 150 lb (68 kg)   BP Readings from Last 3 Encounters:  11/21/18 139/79  11/12/18 140/80  11/05/18 135/89   Pulse Readings from Last 3 Encounters:  11/21/18 (!) 101  11/12/18 100  11/05/18 (!) 106   BMI Readings from Last 3 Encounters:  11/21/18 25.54 kg/m  11/12/18 24.63 kg/m  11/05/18 24.96 kg/m     Patient Care Team    Relationship Specialty Notifications Start End  Mellody Dance, DO PCP - General Family Medicine  08/10/17   Richmond Campbell, MD Consulting Physician Gastroenterology  11/11/14   Festus Aloe, MD Consulting Physician Urology  12/07/15   Alexis Frock, MD Consulting Physician Urology  12/07/15   Bayard Hugger, NP Nurse Practitioner Physical Medicine and Rehabilitation  08/10/17   Meredith Staggers, MD Consulting Physician Physical Medicine and Rehabilitation  08/10/17    Comment: Pain specialist.  Is where pt gets her narcotics from  Kathrynn Ducking, MD Consulting Physician Neurology  08/10/17   Wallene Huh, DPM Consulting Physician Podiatry  08/28/17   Ricard Dillon, MD  Psychiatry  08/28/17    Comment: Treats patient for her bipolar, generalized anxiety disorder etc.  Thea Gist  Physical Medicine and Rehabilitation  08/28/17   Sueanne Margarita, MD Consulting Physician Cardiology   08/28/17    Comment: per pt- she had been seen by her int he past.  I do not see those OV notes though.   Newt Minion, MD Consulting Physician Orthopedic Surgery  08/28/17   Calton Dach, MD Referring Physician Optometry  08/28/17   Heath Lark, MD Consulting Physician Hematology and Oncology  08/28/17      Patient Active Problem List   Diagnosis Date Noted  . Hypertension 08/28/2017    Priority: High  . Fibromyalgia/myofascial pain syndrome 12/06/2011    Priority: High  . Bipolar 1 disorder, mixed, moderate ([place]) 08/10/2017    Priority: Medium  . Migraine aura, persistent, intractable 12/06/2011    Priority: Medium  . Vitamin D deficiency 08/28/2017    Priority: Low  . Herpes simplex- oral lesions 08/10/2017    Priority: Low  . Renal cell cancer ([place]) 11/21/2018  . History of anaphylaxis 08/10/2017  . Postmenopausal syndrome 08/10/2017  . 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 History of kidney cancer 02/22/2016  . Renal mass 12/29/2015  . Left lateral epicondylitis 09/06/2015  . De Quervain's tenosynovitis, right 04/24/2014  . Enlarged lymph node 02/12/2014  . BPPV (benign paroxysmal positional vertigo) 12/05/2013  .  Cervical spondylosis without myelopathy 09/17/2013  . Post concussion syndrome 09/17/2013  . Nephrolithiasis 02/11/2013  . Carpal tunnel syndrome 01/01/2013  . Wrist tendonitis 01/10/2012  . Lumbar spondylosis 12/06/2011  . Lumbar degenerative disc disease 12/06/2011  . Depression with anxiety 12/06/2011    Past Medical history, Surgical history, Family history, Social history, Allergies and Medications have been entered into the medical record, reviewed and changed as needed.    Current Meds  Medication Sig  . acetaminophen (TYLENOL) 500 MG tablet Take 500 mg by mouth every 6 (six) hours as needed for moderate pain.  Marland Kitchen acyclovir (ZOVIRAX) 800 MG tablet Take 800 mg by mouth 3 (three) times daily as needed  (sores on mouth.).   Marland Kitchen albuterol (PROVENTIL HFA;VENTOLIN HFA) 108 (90 Base) MCG/ACT inhaler Inhale 2 puffs into the lungs every 6 (six) hours as needed for wheezing or shortness of breath.  Marland Kitchen almotriptan (AXERT) 12.5 MG tablet TAKE 1 TABLET (12.5 MG TOTAL) BY MOUTH AS NEEDED FOR MIGRAINE (MAX 2 TABS PER WEEK).  . bisacodyl (DULCOLAX) 5 MG EC tablet Take 10 mg by mouth daily as needed for mild constipation.   . cyclobenzaprine (FLEXERIL) 10 MG tablet Take 1 tablet (10 mg total) by mouth 3 (three) times daily.  Marland Kitchen docusate sodium (COLACE) 100 MG capsule Take 100 mg by mouth daily as needed for mild constipation.   Marland Kitchen EPINEPHrine (EPIPEN 2-PAK) 0.3 mg/0.3 mL IJ SOAJ injection Inject 0.3 mLs (0.3 mg total) daily as needed into the skin (allergic reaction.). Reported on 12/06/2015  . fentaNYL (DURAGESIC) 25 MCG/HR Place 1 patch onto the skin every 3 (three) days.  . fexofenadine (ALLEGRA) 180 MG tablet Take 1 tablet (180 mg total) by mouth daily.  Marland Kitchen gabapentin (NEURONTIN) 300 MG capsule Take 600 mg by mouth at bedtime.  Marland Kitchen HYDROcodone-acetaminophen (NORCO/VICODIN) 5-325 MG tablet Take 1 tablet by mouth every 8 (eight) hours as needed for moderate pain.  . hyoscyamine (LEVSIN, ANASPAZ) 0.125 MG tablet Take 0.125 mg by mouth every 4 (four) hours as needed for bladder spasms or cramping.   . lidocaine (XYLOCAINE) 2 % solution Use as directed 20 mLs in the mouth or throat daily as needed for mouth pain.   Marland Kitchen LORazepam (ATIVAN) 1 MG tablet Take 1 mg by mouth 3 (three) times daily.  . potassium chloride SA (K-DUR,KLOR-CON) 20 MEQ tablet Take 20 mEq by mouth 2 (two) times daily.  . promethazine (PHENERGAN) 25 MG tablet Take 1 tablet (25 mg total) by mouth every 6 (six) hours as needed for nausea or vomiting. (Patient taking differently: Take 12.5 mg by mouth every 6 (six) hours as needed for nausea or vomiting. )  . Trospium Chloride 60 MG CP24 Take 1 capsule by mouth every morning.   . Vitamin D, Ergocalciferol,  (DRISDOL) 50000 UNITS CAPS capsule Take 50,000 Units by mouth every 7 (seven) days.     Allergies:  Allergies  Allergen Reactions  . Cymbalta [Duloxetine Hcl] Anaphylaxis    Suicidal if in combination w/ Axert  . Divalproex Sodium Anaphylaxis  . Duract [Bromfenac] Anaphylaxis  . Hydrochlorothiazide Anaphylaxis    Pt loses facial movement uncontrolled;   . Imitrex [Sumatriptan Succinate] Anaphylaxis  . Latex Anaphylaxis  . Mellaril Anaphylaxis  . Olanzapine Anaphylaxis  . Penicillins Anaphylaxis  . Topamax Anaphylaxis and Other (See Comments)    Confusion, tremor, blurred vision  . Aripiprazole Other (See Comments)    Stiffened muscles  . Aspirin Hives and Itching  . Dalmane [Flurazepam Hcl] Other (  See Comments)    Stiffened all muscles  . Darifenacin Hydrobromide Er Hives    Hives on back and looked like sunburn  . Metoclopramide Hcl Other (See Comments)    headache  . Seroquel [Quetiapine Fumerate] Other (See Comments)    extreme fatigue and bad dreams  . Statins Hives  . Stelazine Other (See Comments)    Stiffened muscles  . Thorazine [Chlorpromazine Hcl] Other (See Comments)    Stiffened muscles  . Abilify [Aripiprazole]     Stiffened muscles, extrapyramidal effects all over body.   . Alka-Seltzer [Aspirin Effervescent]     Mouth ulcers, swallowing problems.   . Allopurinol Hives  . Biofreeze [Menthol (Topical Analgesic)] Hives and Itching  . Capzasin [Capsaicin] Itching    Redness   . Clindamycin/Lincomycin     Bad reflux and loose stools   . Clove Oil   . Colchicine Hives  . Cough Drops [Benzocaine]     orthicoat sprays containing menthol or lemon flavor--breaks inside of mouth out, red rash, mouth ulcers.   . Diflucan [Fluconazole] Hives and Itching  . Garlic   . Iohexol      Code: HIVES, Desc: pt broke out in red rash and hives after CT injection on 12/07/09.-pt needs 13 hr prep kit, Onset Date: [id number]   . Ivp Dye [Iodinated Diagnostic Agents]   .  Lyrica [Pregabalin]   . Metamucil [Psyllium]     Mouth ulcers, swallowing problems.  Merril Abbe [Polyethylene Glycol] Nausea And Vomiting  . Myrbetriq [Mirabegron]     swelling  . Nsaids     Mouth ulcers, swelling of mouth.   . Other Itching and Other (See Comments)    Dust, mold, feathers, wool, flannel, cologne, chlorox, household cleaning products, scented candles, cinnamon, ivory soap, paints, sun sentitive, acidic fruits, ( lemons, limes, strawbery, tartrazine yellow#5 and 6 in foods, MSG food additives, sudiium lauryl sulates, hot peppers, spearmint, wintergreen peppermint, mentol, orange, jalapenos. ---headache, breathing, welps, canker sores inside mouth, uticaria  . Potassium-Containing Compounds     Undiluted through IV. --Pain.   . Prednisone     No  Sleep at night.   . Reglan [Metoclopramide]     Headaches.   . Renografin [Diatrizoate]     welps   . Risperdal [Risperidone] Other (See Comments)    Too sedating.   Marland Kitchen Seroquel [Quetiapine Fumarate]     Bad dreams, too sedating.  . Simvastatin Other (See Comments)    Paranoid affect, muscle spasms.   Marland Kitchen Urocit - K [Potassium Citrate]   . Zyprexa [Olanzapine]   . Avelox [Moxifloxacin Hcl In Nacl] Nausea And Vomiting  . Barium-Containing Compounds Rash  . Diclofenac Rash  . Doxycycline Nausea Only    Stomach upset.   Marland Kitchen E-Mycin [Erythromycin Base] Nausea Only  . Fiorinal [Butalbital-Aspirin-Caffeine] Itching and Rash    Welps,  . Flagyl [Metronidazole Hcl] Nausea And Vomiting  . Lamictal [Lamotrigine] Rash  . Pregabalin     Blurry vision, muscle stiffness   . Risperidone Other (See Comments)    Too sedating  . Toradol [Ketorolac Tromethamine] Itching and Rash     Review of Systems:  A fourteen system review of systems was performed and found to be positive as per HPI.   Objective:   Blood pressure 139/79, pulse (!) 101, temperature 98.6 F (37 C), height _0  (1.651 m), weight 153 lb 8 oz (69.6 kg), SpO2 98  %. Body mass index is 25.54 kg/m. General:  Well Developed, well  nourished, appropriate for stated age.  Neuro:  Alert and oriented,  extra-ocular muscles intact  HEENT:  Normocephalic, atraumatic, neck supple, no carotid bruits appreciated  Skin:  no gross rash, warm, pink. Cardiac:  RRR, S1 S2 Respiratory:  ECTA B/L and A/P, Not using accessory muscles, speaking in full sentences- unlabored. Vascular:  Ext warm, no cyanosis apprec.; cap RF less 2 sec. Psych:  No HI/SI, judgement and insight good, Euthymic mood. Full Affect.

## 2018-12-02 ENCOUNTER — Other Ambulatory Visit: Payer: Self-pay | Admitting: Family Medicine

## 2018-12-06 ENCOUNTER — Encounter: Payer: Self-pay | Admitting: Registered Nurse

## 2018-12-06 ENCOUNTER — Encounter: Payer: 59 | Attending: Physical Medicine and Rehabilitation | Admitting: Registered Nurse

## 2018-12-06 VITALS — BP 155/96 | HR 96 | Resp 14 | Ht 65.0 in | Wt 153.0 lb

## 2018-12-06 DIAGNOSIS — F0781 Postconcussional syndrome: Secondary | ICD-10-CM | POA: Diagnosis present

## 2018-12-06 DIAGNOSIS — Z79899 Other long term (current) drug therapy: Secondary | ICD-10-CM | POA: Diagnosis not present

## 2018-12-06 DIAGNOSIS — M4716 Other spondylosis with myelopathy, lumbar region: Secondary | ICD-10-CM | POA: Diagnosis present

## 2018-12-06 DIAGNOSIS — M5136 Other intervertebral disc degeneration, lumbar region: Secondary | ICD-10-CM | POA: Insufficient documentation

## 2018-12-06 DIAGNOSIS — M5412 Radiculopathy, cervical region: Secondary | ICD-10-CM

## 2018-12-06 DIAGNOSIS — M47812 Spondylosis without myelopathy or radiculopathy, cervical region: Secondary | ICD-10-CM

## 2018-12-06 DIAGNOSIS — M546 Pain in thoracic spine: Secondary | ICD-10-CM

## 2018-12-06 DIAGNOSIS — M7061 Trochanteric bursitis, right hip: Secondary | ICD-10-CM

## 2018-12-06 DIAGNOSIS — G894 Chronic pain syndrome: Secondary | ICD-10-CM | POA: Diagnosis not present

## 2018-12-06 DIAGNOSIS — M797 Fibromyalgia: Secondary | ICD-10-CM | POA: Diagnosis present

## 2018-12-06 DIAGNOSIS — Z5181 Encounter for therapeutic drug level monitoring: Secondary | ICD-10-CM | POA: Diagnosis not present

## 2018-12-06 DIAGNOSIS — M7062 Trochanteric bursitis, left hip: Secondary | ICD-10-CM

## 2018-12-06 DIAGNOSIS — Z79891 Long term (current) use of opiate analgesic: Secondary | ICD-10-CM | POA: Diagnosis not present

## 2018-12-06 DIAGNOSIS — G43519 Persistent migraine aura without cerebral infarction, intractable, without status migrainosus: Secondary | ICD-10-CM | POA: Insufficient documentation

## 2018-12-06 DIAGNOSIS — M542 Cervicalgia: Secondary | ICD-10-CM

## 2018-12-06 DIAGNOSIS — M5416 Radiculopathy, lumbar region: Secondary | ICD-10-CM

## 2018-12-06 DIAGNOSIS — M7918 Myalgia, other site: Secondary | ICD-10-CM

## 2018-12-06 DIAGNOSIS — G8929 Other chronic pain: Secondary | ICD-10-CM

## 2018-12-06 MED ORDER — CYCLOBENZAPRINE HCL 10 MG PO TABS: 10.0000 mg | ORAL_TABLET | Freq: Three times a day (TID) | ORAL | 4 refills | Status: DC

## 2018-12-06 MED ORDER — HYDROCODONE-ACETAMINOPHEN 5-325 MG PO TABS: 1.0000 | ORAL_TABLET | Freq: Three times a day (TID) | ORAL | 0 refills | Status: DC | PRN

## 2018-12-06 MED ORDER — FENTANYL 25 MCG/HR TD PT72: 1.0000 | MEDICATED_PATCH | TRANSDERMAL | 0 refills | Status: DC

## 2018-12-06 NOTE — Progress Notes (Signed)
Subjective:    Patient ID: Patty Salas, female    DOB: Feb 11, 1956, 63 y.o.   MRN: [id number]  HPI: Patty Salas is a 63 y.o. female who returns for follow up appointment for chronic pain and medication refill. She states her pain is located in her neck radiating into her bilateral shoulders, mid- lower back pain radiating into her bilateral lower extremities. She rates her pain 10. Her current exercise regime is walking and performing stretching exercises.  Patty Salas arrived hypertensive, blood pressure was re-checked.   Patty Salas Morphine equivalent is 75.00 MME. She is also prescribed Lorazepam  by Dr. Reece Levy.We have discussed the black box warning of using opioids and benzodiazepines. I highlighted the dangers of using these drugs together and discussed the adverse events including respiratory suppression, overdose, cognitive impairment and importance of compliance with current regimen. We will continue to monitor and adjust as indicated.  She  is being closely monitored and under the care of her psychiatrist Dr. Reece Levy.   Last UDS was Performed on 04/10/2018, it was consistent. UDS ordered today.    Pain Inventory Average Pain 10 Pain Right Now 10 My pain is intermittent, constant, sharp, burning, dull, stabbing, tingling and aching  In the last 24 hours, has pain interfered with the following? General activity 10 Relation with others 10 Enjoyment of life 10 What TIME of day is your pain at its worst? all Sleep (in general) Fair  Pain is worse with: walking, bending, sitting, inactivity, standing and some activites Pain improves with: rest, heat/ice, therapy/exercise, medication and injections Relief from Meds: 6  Mobility walk with assistance use a cane use a walker ability to climb steps?  yes do you drive?  yes needs help with transfers transfers alone Do you have any goals in this area?  yes  Function not employed: date last employed . disabled: date disabled  . I need assistance with the following:  meal prep, household duties and shopping Do you have any goals in this area?  yes  Neuro/Psych bladder control problems bowel control problems weakness numbness tremor tingling trouble walking spasms dizziness confusion depression anxiety  Prior Studies Any changes since last visit?  no  Physicians involved in your care Any changes since last visit?  no   Family History  Problem Relation Age of Onset  . Cancer Mother        stomach  . Migraines Mother   . Arthritis Mother   . Emphysema Mother   . Heart disease Father   . Hypertension Father   . Cancer Father        colon  . Dementia Father   . Hypertension Brother   . Migraines Brother   . Hypertension Brother   . Migraines Brother   . Alcohol abuse Brother   . Bipolar disorder Brother   . Migraines Daughter   . Arthritis Maternal Grandmother   . Cancer Maternal Grandmother        stomach  . Diabetes Maternal Grandmother   . Stroke Maternal Grandmother   . Cancer Maternal Aunt        lung  . Emphysema Maternal Aunt   . Cancer Paternal Uncle        colon  . Hypertension Maternal Aunt   . Heart disease Maternal Aunt   . Cancer Paternal Uncle        lung   Social History   Socioeconomic History  . Marital status: Married    Spouse name:  Not on file  . Number of children: 1  . Years of education: COLLEGE1  . Highest education level: Not on file  Occupational History  . Occupation: HOUSEWIFE    Employer: UNEMPLOYED  Social Needs  . Financial resource strain: Not on file  . Food insecurity:    Worry: Not on file    Inability: Not on file  . Transportation needs:    Medical: Not on file    Non-medical: Not on file  Tobacco Use  . Smoking status: Never Smoker  . Smokeless tobacco: Never Used  Substance and Sexual Activity  . Alcohol use: No  . Drug use: No  . Sexual activity: Not on file  Lifestyle  . Physical activity:    Days per week: Not on  file    Minutes per session: Not on file  . Stress: Not on file  Relationships  . Social connections:    Talks on phone: Not on file    Gets together: Not on file    Attends religious service: Not on file    Active member of club or organization: Not on file    Attends meetings of clubs or organizations: Not on file    Relationship status: Not on file  Other Topics Concern  . Not on file  Social History Narrative   Patient is right handed.   Patient drinks caffeine occasionally.   Past Surgical History:  Procedure Laterality Date  .  kidney stones    . ABDOMINAL ADHESION SURGERY    . ABDOMINAL HYSTERECTOMY  1995   partial- hemmoraged after surgery-stitch came loose  . APPENDECTOMY  1993   hemmoraged after surgery- stitch came loose  . CERVICAL DISC SURGERY  06/04/2001   with fusion  . CHOLECYSTECTOMY  1985  . colonoscopy    . COLONOSCOPY    . CYSTOSCOPY    . FOOT SURGERY     lt  . jj stent  07/2002   with ureteroscopy, cystoscopy and then removal of stent in office  . LITHOTRIPSY    . LYMPH NODE BIOPSY Right 03/06/2014   Procedure: right groin LYMPH NODE BIOPSY;  Surgeon: Merrie Roof, MD;  Location: [facility];  Service: General;  Laterality: Right;  . LYMPHADENECTOMY N/A 12/29/2015   Procedure: RETROPERITONEAL LYMPHADENECTOMY;  Surgeon: Alexis Frock, MD;  Location: [facility];  Service: Urology;  Laterality: N/A;  . NECK SURGERY  2002  . ROBOT ASSISTED LAPAROSCOPIC NEPHRECTOMY Left 12/29/2015   Procedure: XI ROBOTIC ASSISTED LEFT LAPAROSCOPIC NEPHRECTOMY;  Surgeon: Alexis Frock, MD;  Location: [facility];  Service: Urology;  Laterality: Left;  . TONSILLECTOMY  1976   Past Medical History:  Diagnosis Date  . Anxiety   . Arthritis   . Asthma   . Bipolar affective ([place])   . Calcifying tendinitis of shoulder   . Carpal tunnel syndrome   . Cervical facet syndrome   . Chronic pain syndrome   . Complication of anesthesia   . Depression   . Diverticulosis    . Falls   . Fibromyalgia   . 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 Full dentures   . Gout   . Headache(784.0)    migraines  . Herpesviral infection   . Hypertension   . IBS (irritable bowel syndrome)    with diarrhea  . Lymphadenopathy   . Myalgia and myositis, unspecified   . PONV (postoperative nausea and vomiting)   . PTSD (post-traumatic stress  disorder)   . Renal calculi   . Restless legs syndrome (RLS)   . Thoracic radiculopathy   . Vitamin D deficiency   . Wears glasses    Ht 5\' 5"  (1.651 m)   Wt 153 lb (69.4 kg)   BMI 25.46 kg/m   Opioid Risk Score:   Fall Risk Score:  `1  Depression screen PHQ 2/9  Depression screen [facility] 2/9 11/21/2018 11/12/2018 11/05/2018 10/03/2018 07/12/2018 02/05/2018 10/05/2017  Decreased Interest 1 1 1 1 1 1 1   Down, Depressed, Hopeless 1 1 0 1 1 1 1   PHQ - 2 Score 2 2 1 2 2 2 2   Altered sleeping 1 1 0 - - - -  Tired, decreased energy 2 1 2  - - - -  Change in appetite 1 1 1  - - - -  Feeling bad or failure about yourself  0 0 0 - - - -  Trouble concentrating 1 1 0 - - - -  Moving slowly or fidgety/restless 1 1 1  - - - -  Suicidal thoughts 0 0 0 - - - -  PHQ-9 Score 8 7 5  - - - -  Difficult doing work/chores Somewhat difficult Somewhat difficult Somewhat difficult - - - -  Some recent data might be hidden    Review of Systems  Constitutional: Positive for chills, diaphoresis, fever and unexpected weight change.  HENT: Negative.   Eyes: Negative.   Respiratory: Negative.   Cardiovascular: Positive for leg swelling.  Gastrointestinal: Positive for abdominal pain, diarrhea and nausea.  Endocrine: Negative.   Genitourinary: Positive for difficulty urinating.  Musculoskeletal: Positive for arthralgias, back pain, gait problem, myalgias, neck pain and neck stiffness.       Spasms   Skin: Negative.   Allergic/Immunologic: Negative.   Neurological: Positive for dizziness, tremors, weakness and numbness.        Tingling  Psychiatric/Behavioral: Positive for confusion and dysphoric mood. The patient is nervous/anxious.   All other systems reviewed and are negative.      Objective:   Physical Exam Vitals signs and nursing note reviewed.  Constitutional:      Appearance: Normal appearance.  Neck:     Musculoskeletal: Normal range of motion and neck supple.  Cardiovascular:     Rate and Rhythm: Normal rate and regular rhythm.     Pulses: Normal pulses.     Heart sounds: Normal heart sounds.  Pulmonary:     Effort: Pulmonary effort is normal.     Breath sounds: Normal breath sounds.  Musculoskeletal:     Comments: Normal Muscle Bulk and Muscle Testing Reveals:  Upper Extremities: Full ROM and Muscle Strength 5/5 Bilateral AC Joint Tenderness  Thoracic and Lumbar Hypersensitivity Bilateral Greater Trochanter Tenderness Lower Extremities: Full ROM and Muscle Strength 5/5 Bilateral Lower Extremities Flexion Produces pain into Bilateral Lower Extremities Arises from chair with ease Narrow Based Gait   Skin:    General: Skin is warm and dry.  Neurological:     Mental Status: She is alert and oriented to person, place, and time.  Psychiatric:        Mood and Affect: Mood normal.        Behavior: Behavior normal.           Assessment & Plan:  1. History of fibromyalgia with myofascial pain and multiple trigger points.Continue with Heat and exercise Regime. Continue withcurrent medication regimen withgabapentin. 12/06/2018 2. Chronic migraine headaches.Continue to Monitor. S/P Botox.on 05/30/2017 12/06/2018 3. Midline Low Back  Pain/ Lumbar Spondylosis/Lumbar degenerative disk disease, L4-5/ Lumbar Radiculitis::Continue with HEP and current medication regimen. Refilled: Fentanyl Patch 25 mcg one patch every three days #10 and ContinueHydrocodone 5/325 mg one tablet every 6 hours as needed #90. 12/06/2018. 4. History of Left Renal Mass: S/P Left Laparoscopic Nephrectomy  02/24/2016. Urology Following. 12/06/2018. 5. 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 12/06/2018 6. Cervicalgia/ Cervical RadiculitisCervical Spondylosis: Continuecurrent medication regiment withGabapentin. Continue with HEP and Continue to monitor. 12/06/2018 7. Muscle Spasm: Continuecurrent medication regiment withFlexeril as needed. 12/06/2018 9. Bilateral Greater Trochanter Bursitis: No complaints today. Continue to alternate with ice and heat therapy. Continue HEP as Tolerated. Continue to monitor. 12/06/2018  20 minutes of face to face patient care time was spent during this visit. All questions were encouraged and answered.  Follow up in 1 month

## 2018-12-13 LAB — TOXASSURE SELECT,+ANTIDEPR,UR

## 2018-12-17 ENCOUNTER — Telehealth: Payer: Self-pay | Admitting: *Deleted

## 2018-12-17 NOTE — Telephone Encounter (Signed)
Urine drug screen for this encounter is consistent for prescribed medication 

## 2018-12-20 ENCOUNTER — Telehealth: Payer: Self-pay | Admitting: Family Medicine

## 2018-12-20 NOTE — Telephone Encounter (Signed)
Patient called states she has blisters on lips & in the past has been prescribed by her prior PCP :  acyclovir (ZOVIRAX) 800 MG tablet [[id number]]   Order Details  Dose: 800 mg Route: Oral Frequency: 3 times daily PRN for sores on mouth.  Dispense Quantity: -- Refills: -- Fills remaining: --        Sig: Take 800 mg by mouth 3 (three) times daily as needed (sores on mouth.).      ----Forwarding message to medical assistant if approved send to :   [id number] [address], [place]. [phone] (Phone) [phone] (Fax)   --Dion Body

## 2018-12-23 ENCOUNTER — Other Ambulatory Visit: Payer: Self-pay

## 2018-12-23 NOTE — Telephone Encounter (Signed)
Good Afternoon Patty Salas, Last time it was listed as active was 3 years ago System lists as an allergy Please have Dr. Raliegh Scarlet- address and review tomorrow Thanks! Valetta Fuller

## 2018-12-23 NOTE — Telephone Encounter (Signed)
Sent to provider to review as we have not written for the patient. MPulliam, CMA/RT(R)

## 2018-12-23 NOTE — Telephone Encounter (Signed)
Patient is requesting a refill on acyclovir that was last filled by a pervious provider.  Patient takes as needed for cold sores and states that she currently is having a flare up.  LOV 11/21/2017.  Please review and advise. MPulliam, CMA/RT(R)

## 2018-12-24 NOTE — Telephone Encounter (Signed)
Sent a MyChart message to ask the patient how she has tolerated med in the past. MPulliam, CMA/RT(R)

## 2018-12-24 NOTE — Telephone Encounter (Signed)
Spoke to the patient and she states that she does tolerate the acyclovir well. Please review and advise. MPulliam, CMA/RT(R)

## 2018-12-25 MED ORDER — ACYCLOVIR 800 MG PO TABS: 800.0000 mg | ORAL_TABLET | Freq: Three times a day (TID) | ORAL | 0 refills | Status: AC

## 2018-12-25 NOTE — Telephone Encounter (Signed)
Verbal approved by Dr. Raliegh Scarlet

## 2019-01-07 ENCOUNTER — Other Ambulatory Visit: Payer: Self-pay

## 2019-01-07 ENCOUNTER — Encounter: Payer: 59 | Attending: Physical Medicine and Rehabilitation | Admitting: Registered Nurse

## 2019-01-07 VITALS — BP 126/72 | HR 92 | Ht 65.0 in | Wt 153.0 lb

## 2019-01-07 DIAGNOSIS — G8929 Other chronic pain: Secondary | ICD-10-CM

## 2019-01-07 DIAGNOSIS — Z5181 Encounter for therapeutic drug level monitoring: Secondary | ICD-10-CM | POA: Insufficient documentation

## 2019-01-07 DIAGNOSIS — G894 Chronic pain syndrome: Secondary | ICD-10-CM

## 2019-01-07 DIAGNOSIS — M7918 Myalgia, other site: Secondary | ICD-10-CM

## 2019-01-07 DIAGNOSIS — M4716 Other spondylosis with myelopathy, lumbar region: Secondary | ICD-10-CM | POA: Insufficient documentation

## 2019-01-07 DIAGNOSIS — M5136 Other intervertebral disc degeneration, lumbar region: Secondary | ICD-10-CM

## 2019-01-07 DIAGNOSIS — M7061 Trochanteric bursitis, right hip: Secondary | ICD-10-CM

## 2019-01-07 DIAGNOSIS — M542 Cervicalgia: Secondary | ICD-10-CM

## 2019-01-07 DIAGNOSIS — G43519 Persistent migraine aura without cerebral infarction, intractable, without status migrainosus: Secondary | ICD-10-CM | POA: Insufficient documentation

## 2019-01-07 DIAGNOSIS — M546 Pain in thoracic spine: Secondary | ICD-10-CM

## 2019-01-07 DIAGNOSIS — M7062 Trochanteric bursitis, left hip: Secondary | ICD-10-CM

## 2019-01-07 DIAGNOSIS — Z79899 Other long term (current) drug therapy: Secondary | ICD-10-CM | POA: Insufficient documentation

## 2019-01-07 DIAGNOSIS — M5412 Radiculopathy, cervical region: Secondary | ICD-10-CM

## 2019-01-07 DIAGNOSIS — M47812 Spondylosis without myelopathy or radiculopathy, cervical region: Secondary | ICD-10-CM

## 2019-01-07 DIAGNOSIS — Z79891 Long term (current) use of opiate analgesic: Secondary | ICD-10-CM

## 2019-01-07 DIAGNOSIS — M5416 Radiculopathy, lumbar region: Secondary | ICD-10-CM | POA: Diagnosis not present

## 2019-01-07 DIAGNOSIS — M797 Fibromyalgia: Secondary | ICD-10-CM

## 2019-01-07 DIAGNOSIS — F0781 Postconcussional syndrome: Secondary | ICD-10-CM | POA: Insufficient documentation

## 2019-01-07 MED ORDER — FENTANYL 25 MCG/HR TD PT72: 1.0000 | MEDICATED_PATCH | TRANSDERMAL | 0 refills | Status: DC

## 2019-01-07 MED ORDER — HYDROCODONE-ACETAMINOPHEN 5-325 MG PO TABS: 1.0000 | ORAL_TABLET | Freq: Three times a day (TID) | ORAL | 0 refills | Status: DC | PRN

## 2019-01-07 NOTE — Progress Notes (Signed)
Subjective:    Patient ID: Patty Salas, female    DOB: 12-05-1955, 63 y.o.   MRN: [id number]  HPI: Patty Salas is a 63 y.o. female her appointment was changed, due to national recommendations of social distancing due to [place] 19, an audio/video telehealth visit is felt to be most appropriate for this patient at this time.  See Chart message from today for the patient's consent to telehealth from [place].     She states her pain is located in her neck radiating into her head and bilateral shoulders, mid- lower back radiating into her bilateral lower extremities. She  rates her pain 10. Her current exercise regime is walking and performing stretching exercises.  Ms. Foulks Morphine equivalent is 75.00MME.  She  is also prescribed Lorazepam  Dr. Reece Levy .We have discussed the black box warning of using opioids and benzodiazepines. I highlighted the dangers of using these drugs together and discussed the adverse events including respiratory suppression, overdose, cognitive impairment and importance of compliance with current regimen. We will continue to monitor and adjust as indicated.  She  is being closely monitored and under the care of her psychiatrist Dr. Reece Levy.   Kennon Rounds CMA asked the Health and History Questions. This Provider and Kathrin Ruddy verified we were speaking with the correct person using two identifiers.   Pain Inventory Average Pain 9 Pain Right Now 10 My pain is constant and sharp  In the last 24 hours, has pain interfered with the following? General activity 10 Relation with others 10 Enjoyment of life 10 What TIME of day is your pain at its worst? night Sleep (in general) Poor  Pain is worse with: sitting and standing Pain improves with: rest and medication Relief from Meds: 7  Mobility use a cane how many minutes can you walk? 5 ability to climb steps?  no do you drive?  yes  Function disabled: date disabled na I  need assistance with the following:  meal prep and household duties  Neuro/Psych trouble walking dizziness  Prior Studies CT/MRI  Physicians involved in your care Dr. Tammi Klippel- Urology   Family History  Problem Relation Age of Onset  . Cancer Mother        stomach  . Migraines Mother   . Arthritis Mother   . Emphysema Mother   . Heart disease Father   . Hypertension Father   . Cancer Father        colon  . Dementia Father   . Hypertension Brother   . Migraines Brother   . Hypertension Brother   . Migraines Brother   . Alcohol abuse Brother   . Bipolar disorder Brother   . Migraines Daughter   . Arthritis Maternal Grandmother   . Cancer Maternal Grandmother        stomach  . Diabetes Maternal Grandmother   . Stroke Maternal Grandmother   . Cancer Maternal Aunt        lung  . Emphysema Maternal Aunt   . Cancer Paternal Uncle        colon  . Hypertension Maternal Aunt   . Heart disease Maternal Aunt   . Cancer Paternal Uncle        lung   Social History   Socioeconomic History  . Marital status: Married    Spouse name: Not on file  . Number of children: 1  . Years of education: COLLEGE1  . Highest education level: Not on file  Occupational History  . Occupation: HOUSEWIFE    Employer: UNEMPLOYED  Social Needs  . Financial resource strain: Not on file  . Food insecurity:    Worry: Not on file    Inability: Not on file  . Transportation needs:    Medical: Not on file    Non-medical: Not on file  Tobacco Use  . Smoking status: Never Smoker  . Smokeless tobacco: Never Used  Substance and Sexual Activity  . Alcohol use: No  . Drug use: No  . Sexual activity: Not on file  Lifestyle  . Physical activity:    Days per week: Not on file    Minutes per session: Not on file  . Stress: Not on file  Relationships  . Social connections:    Talks on phone: Not on file    Gets together: Not on file    Attends religious service: Not on file    Active  member of club or organization: Not on file    Attends meetings of clubs or organizations: Not on file    Relationship status: Not on file  Other Topics Concern  . Not on file  Social History Narrative   Patient is right handed.   Patient drinks caffeine occasionally.   Past Surgical History:  Procedure Laterality Date  .  kidney stones    . ABDOMINAL ADHESION SURGERY    . ABDOMINAL HYSTERECTOMY  1995   partial- hemmoraged after surgery-stitch came loose  . APPENDECTOMY  1993   hemmoraged after surgery- stitch came loose  . CERVICAL DISC SURGERY  06/04/2001   with fusion  . CHOLECYSTECTOMY  1985  . colonoscopy    . COLONOSCOPY    . CYSTOSCOPY    . FOOT SURGERY     lt  . jj stent  07/2002   with ureteroscopy, cystoscopy and then removal of stent in office  . LITHOTRIPSY    . LYMPH NODE BIOPSY Right 03/06/2014   Procedure: right groin LYMPH NODE BIOPSY;  Surgeon: Merrie Roof, MD;  Location: [facility];  Service: General;  Laterality: Right;  . LYMPHADENECTOMY N/A 12/29/2015   Procedure: RETROPERITONEAL LYMPHADENECTOMY;  Surgeon: Alexis Frock, MD;  [facility];  Service: Urology;  Laterality: N/A;  . NECK SURGERY  2002  . ROBOT ASSISTED LAPAROSCOPIC NEPHRECTOMY Left 12/29/2015   Procedure: XI ROBOTIC ASSISTED LEFT LAPAROSCOPIC NEPHRECTOMY;  Surgeon: Alexis Frock, MD;  [facility];  Service: Urology;  Laterality: Left;  . TONSILLECTOMY  1976   Past Medical History:  Diagnosis Date  . Anxiety   . Arthritis   . Asthma   . Bipolar affective ([place])   . Calcifying tendinitis of shoulder   . Carpal tunnel syndrome   . Cervical facet syndrome   . Chronic pain syndrome   . Complication of anesthesia   . Depression   . Diverticulosis   . Falls   . Fibromyalgia   . 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 Full dentures   . Gout   . Headache(784.0)    migraines  . Herpesviral infection   . Hypertension   . IBS  (irritable bowel syndrome)    with diarrhea  . Lymphadenopathy   . Myalgia and myositis, unspecified   . PONV (postoperative nausea and vomiting)   . PTSD (post-traumatic stress disorder)   . Renal calculi   . Restless legs syndrome (RLS)   . Thoracic radiculopathy   . Vitamin D deficiency   .  Wears glasses    BP 126/72 Comment: pt reported, virtual call visit  Pulse 92 Comment: pt reported, virtual call visit  Ht 5\' 5"  (1.651 m)   Wt 153 lb (69.4 kg)   BMI 25.46 kg/m   Opioid Risk Score:   Fall Risk Score:  `1  Depression screen PHQ 2/9  Depression screen [facility] 2/9 01/07/2019 11/21/2018 11/12/2018 11/05/2018 10/03/2018 07/12/2018 02/05/2018  Decreased Interest 1 1 1 1 1 1 1   Down, Depressed, Hopeless 1 1 1  0 1 1 1   PHQ - 2 Score 2 2 2 1 2 2 2   Altered sleeping - 1 1 0 - - -  Tired, decreased energy - 2 1 2  - - -  Change in appetite - 1 1 1  - - -  Feeling bad or failure about yourself  - 0 0 0 - - -  Trouble concentrating - 1 1 0 - - -  Moving slowly or fidgety/restless - 1 1 1  - - -  Suicidal thoughts - 0 0 0 - - -  PHQ-9 Score - 8 7 5  - - -  Difficult doing work/chores - Somewhat difficult Somewhat difficult Somewhat difficult - - -  Some recent data might be hidden    Review of Systems  Constitutional: Positive for chills.  HENT: Positive for ear pain and sore throat.   Eyes: Negative.   Respiratory: Positive for cough and chest tightness.   Cardiovascular: Negative.   Gastrointestinal: Positive for vomiting.  Endocrine: Negative.   Genitourinary: Negative.   Musculoskeletal: Positive for back pain and neck pain.  Skin: Negative.   Allergic/Immunologic: Negative.   Neurological: Positive for dizziness.  Hematological: Negative.   Psychiatric/Behavioral: Positive for dysphoric mood. The patient is nervous/anxious.   All other systems reviewed and are negative.      Objective:   Physical Exam Vitals signs and nursing note reviewed.  Musculoskeletal:     Comments:  No Physical Exam Performed: Virtual Visit  Neurological:     Mental Status: She is oriented to person, place, and time.           Assessment & Plan:    1. History of fibromyalgia with myofascial pain and multiple trigger points.Continue with Heat and exercise Regime. Continue withcurrent medication regimen withgabapentin. 01/07/2019 2. Chronic migraine headaches.Continue to Monitor. S/P Botox.on 05/30/2017 01/07/2019 3. Midline Low Back Pain/ Lumbar Spondylosis/Lumbar degenerative disk disease, L4-5/ Lumbar Radiculitis::Continue with HEP and current medication regimen. Refilled: Fentanyl Patch 25 mcg one patch every three days #10 and ContinueHydrocodone 5/325 mg one tablet every 6 hours as needed #90. 01/07/2019. 4. 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 Urology Following. 01/07/2019. 5. 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 01/07/2019 6. Cervicalgia/ Cervical RadiculitisCervical Spondylosis: Continuecurrent medication regiment withGabapentin. Continue with HEP and Continue to monitor. 01/07/2019 7. Muscle Spasm: Continuecurrent medication regiment withFlexeril as needed. 01/07/2019 9. Bilateral Greater Trochanter Bursitis: No complaints today. Continue to alternate with ice and heat therapy. Continue HEP as Tolerated. Continue to monitor.01/07/2019  Follow up in 1 month  Location of patient: In her Home Location of provider: Office Established patient Time spent on call: 15 minutes

## 2019-01-13 ENCOUNTER — Encounter: Payer: Self-pay | Admitting: Registered Nurse

## 2019-01-31 ENCOUNTER — Telehealth: Payer: Self-pay | Admitting: Physical Medicine & Rehabilitation

## 2019-01-31 MED ORDER — METHYLPREDNISOLONE 4 MG PO TBPK: ORAL_TABLET | ORAL | 0 refills | Status: DC

## 2019-01-31 NOTE — Telephone Encounter (Signed)
Patty Salas called office with complaints of joint pain and inflammation. Medrol dose pak e-scribed. Patty Salas is aware of the above.

## 2019-01-31 NOTE — Telephone Encounter (Signed)
Patient is needing a refill on Medrol Dose Pak for her wrist.  Having pain and the Medrol Dose Pak helps her with this.

## 2019-02-04 DIAGNOSIS — N200 Calculus of kidney: Secondary | ICD-10-CM | POA: Diagnosis not present

## 2019-02-04 DIAGNOSIS — N3281 Overactive bladder: Secondary | ICD-10-CM | POA: Diagnosis not present

## 2019-02-04 DIAGNOSIS — C642 Malignant neoplasm of left kidney, except renal pelvis: Secondary | ICD-10-CM | POA: Diagnosis not present

## 2019-02-05 ENCOUNTER — Encounter: Payer: 59 | Attending: Physical Medicine and Rehabilitation | Admitting: Registered Nurse

## 2019-02-05 ENCOUNTER — Encounter: Payer: Self-pay | Admitting: Registered Nurse

## 2019-02-05 ENCOUNTER — Ambulatory Visit: Payer: 59 | Admitting: Physical Medicine & Rehabilitation

## 2019-02-05 ENCOUNTER — Other Ambulatory Visit: Payer: Self-pay

## 2019-02-05 VITALS — BP 154/88 | HR 103 | Temp 99.3°F | Ht 66.0 in | Wt 152.0 lb

## 2019-02-05 DIAGNOSIS — Z5181 Encounter for therapeutic drug level monitoring: Secondary | ICD-10-CM | POA: Insufficient documentation

## 2019-02-05 DIAGNOSIS — M542 Cervicalgia: Secondary | ICD-10-CM | POA: Diagnosis not present

## 2019-02-05 DIAGNOSIS — M797 Fibromyalgia: Secondary | ICD-10-CM | POA: Insufficient documentation

## 2019-02-05 DIAGNOSIS — M47812 Spondylosis without myelopathy or radiculopathy, cervical region: Secondary | ICD-10-CM

## 2019-02-05 DIAGNOSIS — M5416 Radiculopathy, lumbar region: Secondary | ICD-10-CM | POA: Diagnosis not present

## 2019-02-05 DIAGNOSIS — M7918 Myalgia, other site: Secondary | ICD-10-CM

## 2019-02-05 DIAGNOSIS — Z79891 Long term (current) use of opiate analgesic: Secondary | ICD-10-CM

## 2019-02-05 DIAGNOSIS — M546 Pain in thoracic spine: Secondary | ICD-10-CM

## 2019-02-05 DIAGNOSIS — G894 Chronic pain syndrome: Secondary | ICD-10-CM | POA: Insufficient documentation

## 2019-02-05 DIAGNOSIS — M4716 Other spondylosis with myelopathy, lumbar region: Secondary | ICD-10-CM | POA: Insufficient documentation

## 2019-02-05 DIAGNOSIS — Z79899 Other long term (current) drug therapy: Secondary | ICD-10-CM | POA: Insufficient documentation

## 2019-02-05 DIAGNOSIS — M7062 Trochanteric bursitis, left hip: Secondary | ICD-10-CM

## 2019-02-05 DIAGNOSIS — M7061 Trochanteric bursitis, right hip: Secondary | ICD-10-CM

## 2019-02-05 DIAGNOSIS — G8929 Other chronic pain: Secondary | ICD-10-CM

## 2019-02-05 DIAGNOSIS — M5412 Radiculopathy, cervical region: Secondary | ICD-10-CM

## 2019-02-05 DIAGNOSIS — M5136 Other intervertebral disc degeneration, lumbar region: Secondary | ICD-10-CM | POA: Insufficient documentation

## 2019-02-05 DIAGNOSIS — G43519 Persistent migraine aura without cerebral infarction, intractable, without status migrainosus: Secondary | ICD-10-CM

## 2019-02-05 DIAGNOSIS — F0781 Postconcussional syndrome: Secondary | ICD-10-CM | POA: Insufficient documentation

## 2019-02-05 MED ORDER — HYDROCODONE-ACETAMINOPHEN 5-325 MG PO TABS: 1.0000 | ORAL_TABLET | Freq: Three times a day (TID) | ORAL | 0 refills | Status: DC | PRN

## 2019-02-05 MED ORDER — FENTANYL 25 MCG/HR TD PT72: 1.0000 | MEDICATED_PATCH | TRANSDERMAL | 0 refills | Status: DC

## 2019-02-05 NOTE — Progress Notes (Signed)
Subjective:    Patient ID: Patty Salas, female    DOB: [birth year], 63 y.o.   MRN: [id number]  HPI: Patty Salas is a 63 y.o. female her appointment was changed, due to national recommendations of social distancing due to [place] 19, an audio/video telehealth visit is felt to be most appropriate for this patient at this time.  See Chart message from today for the patient's consent to telehealth from [place].   She states her pain is located in her neck radiating into her bilateral shoulders, mid-lower back pain radiating into her bilateral hips and bilateral lower extremities and bilateral knee pain. She rates her pain 9. Her current exercise regime is walking and performing stretching exercises.  Patty Salas also reports she had a fever a few days ago and a sore throat, she will be following up with her PCP, she was encouraged to call her PCP, she verbalizes understanding.   Patty Salas is 75.00MME. She is also prescribed Lorazepam  by Dr. Reece Levy.We have discussed the black box warning of using opioids and benzodiazepines. I highlighted the dangers of using these drugs together and discussed the adverse events including respiratory suppression, overdose, cognitive impairment and importance of compliance with current regimen. We will continue to monitor and adjust as indicated.  she is being closely monitored and under the care of her psychiatrist Dr. Reece Levy.  Patty Salas CMA asked the Health and History Questions. This provider and Patty Salas  speaking with the correct person using two identifiers.   Pain Inventory Average Pain 8 Pain Right Now 9 My pain is constant, sharp and stabbing  In the last 24 hours, has pain interfered with the following? General activity 9 Relation with others 10 Enjoyment of life 8 What TIME of day is your pain at its worst? all Sleep (in general) Fair  Pain is worse with: bending,  inactivity and standing Pain improves with: therapy/exercise and medication Relief from Meds: 7  Mobility walk without assistance walk with assistance ability to climb steps?  no do you drive?  yes  Function not employed: date last employed .  Neuro/Psych trouble walking dizziness  Prior Studies Any changes since last visit?  no  Physicians involved in your care Any changes since last visit?  no   Family History  Problem Relation Age of Onset  . Cancer Mother        stomach  . Migraines Mother   . Arthritis Mother   . Emphysema Mother   . Heart disease Father   . Hypertension Father   . Cancer Father        colon  . Dementia Father   . Hypertension Brother   . Migraines Brother   . Hypertension Brother   . Migraines Brother   . Alcohol abuse Brother   . Bipolar disorder Brother   . Migraines Daughter   . Arthritis Maternal Grandmother   . Cancer Maternal Grandmother        stomach  . Diabetes Maternal Grandmother   . Stroke Maternal Grandmother   . Cancer Maternal Aunt        lung  . Emphysema Maternal Aunt   . Cancer Paternal Uncle        colon  . Hypertension Maternal Aunt   . Heart disease Maternal Aunt   . Cancer Paternal Uncle        lung   Social History   Socioeconomic History  . Marital  status: Married    Spouse name: Not on file  . Number of children: 1  . Years of education: COLLEGE1  . Highest education level: Not on file  Occupational History  . Occupation: HOUSEWIFE    Employer: UNEMPLOYED  Social Needs  . Financial resource strain: Not on file  . Food insecurity:    Worry: Not on file    Inability: Not on file  . Transportation needs:    Medical: Not on file    Non-medical: Not on file  Tobacco Use  . Smoking status: Never Smoker  . Smokeless tobacco: Never Used  Substance and Sexual Activity  . Alcohol use: No  . Drug use: No  . Sexual activity: Not on file  Lifestyle  . Physical activity:    Days per week: Not on  file    Minutes per session: Not on file  . Stress: Not on file  Relationships  . Social connections:    Talks on phone: Not on file    Gets together: Not on file    Attends religious service: Not on file    Active member of club or organization: Not on file    Attends meetings of clubs or organizations: Not on file    Relationship status: Not on file  Other Topics Concern  . Not on file  Social History Narrative   Patient is right handed.   Patient drinks caffeine occasionally.   Past Surgical History:  Procedure Laterality Date  .  kidney stones    . ABDOMINAL ADHESION SURGERY    . ABDOMINAL HYSTERECTOMY  1995   partial- hemmoraged after surgery-stitch came loose  . APPENDECTOMY  1993   hemmoraged after surgery- stitch came loose  . CERVICAL DISC SURGERY  06/04/2001   with fusion  . CHOLECYSTECTOMY  1985  . colonoscopy    . COLONOSCOPY    . CYSTOSCOPY    . FOOT SURGERY     lt  . jj stent  07/2002   with ureteroscopy, cystoscopy and then removal of stent in office  . LITHOTRIPSY    . LYMPH NODE BIOPSY Right 03/06/2014   Procedure: right groin LYMPH NODE BIOPSY;  Surgeon: Merrie Roof, MD;  Location: [facility];  Service: General;  Laterality: Right;  . LYMPHADENECTOMY N/A 12/29/2015   Procedure: RETROPERITONEAL LYMPHADENECTOMY;  Surgeon: Alexis Frock, MD;  Location: [facility];  Service: Urology;  Laterality: N/A;  . NECK SURGERY  2002  . ROBOT ASSISTED LAPAROSCOPIC NEPHRECTOMY Left 12/29/2015   Procedure: XI ROBOTIC ASSISTED LEFT LAPAROSCOPIC NEPHRECTOMY;  Surgeon: Alexis Frock, MD;  Location: [facility];  Service: Urology;  Laterality: Left;  . TONSILLECTOMY  1976   Past Medical History:  Diagnosis Date  . Anxiety   . Arthritis   . Asthma   . Bipolar affective ([place])   . Calcifying tendinitis of shoulder   . Carpal tunnel syndrome   . Cervical facet syndrome   . Chronic pain syndrome   . Complication of anesthesia   . Depression   . Diverticulosis    . Falls   . Fibromyalgia   . 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 Full dentures   . Gout   . Headache(784.0)    migraines  . Herpesviral infection   . Hypertension   . IBS (irritable bowel syndrome)    with diarrhea  . Lymphadenopathy   . Myalgia and myositis, unspecified   . PONV (postoperative nausea and  vomiting)   . PTSD (post-traumatic stress disorder)   . Renal calculi   . Restless legs syndrome (RLS)   . Thoracic radiculopathy   . Vitamin D deficiency   . Wears glasses    BP (!) 154/88   Pulse (!) 103   Temp 99.3 F (37.4 C)   Ht 5\' 6"  (1.676 m)   Wt 152 lb (68.9 kg)   BMI 24.53 kg/m   Opioid Risk Score:   Fall Risk Score:  `1  Depression screen PHQ 2/9  Depression screen [facility] 2/9 01/07/2019 11/21/2018 11/12/2018 11/05/2018 10/03/2018 07/12/2018 02/05/2018  Decreased Interest 1 1 1 1 1 1 1   Down, Depressed, Hopeless 1 1 1  0 1 1 1   PHQ - 2 Score 2 2 2 1 2 2 2   Altered sleeping - 1 1 0 - - -  Tired, decreased energy - 2 1 2  - - -  Change in appetite - 1 1 1  - - -  Feeling bad or failure about yourself  - 0 0 0 - - -  Trouble concentrating - 1 1 0 - - -  Moving slowly or fidgety/restless - 1 1 1  - - -  Suicidal thoughts - 0 0 0 - - -  PHQ-9 Score - 8 7 5  - - -  Difficult doing work/chores - Somewhat difficult Somewhat difficult Somewhat difficult - - -  Some recent data might be hidden    Review of Systems  Constitutional: Positive for fever.  HENT: Positive for postnasal drip and sore throat.   Eyes: Negative.   Respiratory: Positive for cough.   Gastrointestinal: Positive for constipation and diarrhea.  Genitourinary:       Incontinence  Musculoskeletal: Positive for arthralgias, back pain, gait problem and neck pain.  Skin: Negative.   Allergic/Immunologic: Negative.   All other systems reviewed and are negative.      Objective:   Physical Exam Vitals signs and nursing note reviewed.  Musculoskeletal:      Comments: No Physical Exam Perform: Virtual Visit  Neurological:     Mental Status: She is oriented to person, place, and time.           Assessment & Plan:  1. History of fibromyalgia with myofascial pain and multiple trigger points.Continue with Heat and exercise Regime. Continue withcurrent medication regimen withgabapentin. 02/05/2019 2. Chronic migraine headaches.Continue to Monitor. S/P Botox.on 05/30/2017 02/05/2019 3. Midline Low Back Pain/ Lumbar Spondylosis/Lumbar degenerative disk disease, L4-5/ Lumbar Radiculitis::Continue with HEP and current medication regimen. Refilled: Fentanyl Patch 25 mcg one patch every three days #10 and ContinueHydrocodone 5/325 mg one tablet every 6 hours as needed #90. 02/05/2019. 4. 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 Urology Following. 02/05/2019. 5. 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 02/05/2019 6. Cervicalgia/ Cervical RadiculitisCervical Spondylosis: Continuecurrent medication regiment withGabapentin. Continue with HEP and Continue to monitor. 02/05/2019 7. Muscle Spasm: Continuecurrent medication regiment withFlexeril as needed. 02/05/2019 9. Bilateral Greater Trochanter Bursitis: Continue to alternate with ice and heat therapy. Continue HEP as Tolerated. Continue to monitor.02/05/2019  F/U in 1 month  Telephone Call Location of patient: In her Home Location of provider: Office Established patient Time spent on call: 15 Minutes

## 2019-02-12 ENCOUNTER — Telehealth: Payer: Self-pay | Admitting: Neurology

## 2019-02-12 MED ORDER — ALMOTRIPTAN MALATE 12.5 MG PO TABS: ORAL_TABLET | ORAL | 0 refills | Status: DC

## 2019-02-12 NOTE — Telephone Encounter (Signed)
Pt called in and wants to know if her appt in July can be a telephone visit, she states she also would need meds refilled by June 9, I advised her she can call to refill when she is down to 5 days worth of meds.

## 2019-02-12 NOTE — Telephone Encounter (Signed)
I contacted the patient and advised we would readdress her appointment and if we are still limiting the number of patient's coming into the clinic we could look at doing a telephone visit in July.   I advised I would send the Almotriptan 12.5 mg and ask pharm to be placed on hold until patient needs refill.   Patient was agreeable to this information.

## 2019-03-06 ENCOUNTER — Encounter: Payer: 59 | Attending: Physical Medicine and Rehabilitation | Admitting: Registered Nurse

## 2019-03-06 ENCOUNTER — Encounter: Payer: Self-pay | Admitting: Registered Nurse

## 2019-03-06 ENCOUNTER — Other Ambulatory Visit: Payer: Self-pay

## 2019-03-06 VITALS — BP 121/73 | HR 96 | Temp 98.7°F | Ht 64.0 in | Wt 154.8 lb

## 2019-03-06 DIAGNOSIS — M797 Fibromyalgia: Secondary | ICD-10-CM | POA: Insufficient documentation

## 2019-03-06 DIAGNOSIS — F0781 Postconcussional syndrome: Secondary | ICD-10-CM | POA: Insufficient documentation

## 2019-03-06 DIAGNOSIS — M542 Cervicalgia: Secondary | ICD-10-CM

## 2019-03-06 DIAGNOSIS — M5412 Radiculopathy, cervical region: Secondary | ICD-10-CM

## 2019-03-06 DIAGNOSIS — M4716 Other spondylosis with myelopathy, lumbar region: Secondary | ICD-10-CM | POA: Insufficient documentation

## 2019-03-06 DIAGNOSIS — M5416 Radiculopathy, lumbar region: Secondary | ICD-10-CM

## 2019-03-06 DIAGNOSIS — G8929 Other chronic pain: Secondary | ICD-10-CM

## 2019-03-06 DIAGNOSIS — Z79899 Other long term (current) drug therapy: Secondary | ICD-10-CM | POA: Insufficient documentation

## 2019-03-06 DIAGNOSIS — M7062 Trochanteric bursitis, left hip: Secondary | ICD-10-CM

## 2019-03-06 DIAGNOSIS — M7918 Myalgia, other site: Secondary | ICD-10-CM

## 2019-03-06 DIAGNOSIS — M7061 Trochanteric bursitis, right hip: Secondary | ICD-10-CM

## 2019-03-06 DIAGNOSIS — G43519 Persistent migraine aura without cerebral infarction, intractable, without status migrainosus: Secondary | ICD-10-CM | POA: Insufficient documentation

## 2019-03-06 DIAGNOSIS — G894 Chronic pain syndrome: Secondary | ICD-10-CM | POA: Insufficient documentation

## 2019-03-06 DIAGNOSIS — M5136 Other intervertebral disc degeneration, lumbar region: Secondary | ICD-10-CM | POA: Insufficient documentation

## 2019-03-06 DIAGNOSIS — M546 Pain in thoracic spine: Secondary | ICD-10-CM

## 2019-03-06 DIAGNOSIS — M47812 Spondylosis without myelopathy or radiculopathy, cervical region: Secondary | ICD-10-CM

## 2019-03-06 DIAGNOSIS — Z5181 Encounter for therapeutic drug level monitoring: Secondary | ICD-10-CM

## 2019-03-06 DIAGNOSIS — Z79891 Long term (current) use of opiate analgesic: Secondary | ICD-10-CM

## 2019-03-06 MED ORDER — FENTANYL 25 MCG/HR TD PT72: 1.0000 | MEDICATED_PATCH | TRANSDERMAL | 0 refills | Status: DC

## 2019-03-06 NOTE — Progress Notes (Signed)
Subjective:    Patient ID: Patty Salas, female    DOB: December 27, 1955, 63 y.o.   MRN: [id number]  HPI: Patty Salas is a 62 y.o. female her appointment was changed, due to national recommendations of social distancing due to [place] 19, an audio/video telehealth visit is felt to be most appropriate for this patient at this time.  See Chart message from today for the patient's consent to telehealth from [place].  She states her pain is located in her neck radiating into her bilateral shoulders,mid- lower back pain radiating into her bilateral lower extremities and bilateral hip pain. She rates her pain 10. Her current exercise regime is walking and performing stretching exercises.   Patty Salas Morphine equivalent is 75.00 MME.She  is also prescribed Lorazepam by Dr. Reece Levy. We have discussed the black box warning of using opioids and benzodiazepines. I highlighted the dangers of using these drugs together and discussed the adverse events including respiratory suppression, overdose, cognitive impairment and importance of compliance with current regimen. We will continue to monitor and adjust as indicated.  She  is being closely monitored and under the care of her psychiatrist Dr. Reece Levy at [facility].    Last UDS was Performed on 12/06/2018, it was consistent.   Patty Salas CMA asked the Health and History Questions. This provider and Patty Salas verified we were speaking with the correct person using two identifiers.  Pain Inventory Average Pain 8 Pain Right Now 10 My pain is constant, sharp and stabbing  In the last 24 hours, has pain interfered with the following? General activity 9 Relation with others 10 Enjoyment of life 8 What TIME of day is your pain at its worst? all Sleep (in general) Fair  Pain is worse with: bending, inactivity and standing Pain improves with: therapy/exercise and medication Relief from Meds: 7  Mobility walk  with assistance ability to climb steps?  no do you drive?  no  Function not employed: date last employed na  Neuro/Psych trouble walking dizziness  Prior Studies Any changes since last visit?  no  Physicians involved in your care Patty Salas   Family History  Problem Relation Age of Onset  . Cancer Mother        stomach  . Migraines Mother   . Arthritis Mother   . Emphysema Mother   . Heart disease Father   . Hypertension Father   . Cancer Father        colon  . Dementia Father   . Hypertension Brother   . Migraines Brother   . Hypertension Brother   . Migraines Brother   . Alcohol abuse Brother   . Bipolar disorder Brother   . Migraines Daughter   . Arthritis Maternal Grandmother   . Cancer Maternal Grandmother        stomach  . Diabetes Maternal Grandmother   . Stroke Maternal Grandmother   . Cancer Maternal Aunt        lung  . Emphysema Maternal Aunt   . Cancer Paternal Uncle        colon  . Hypertension Maternal Aunt   . Heart disease Maternal Aunt   . Cancer Paternal Uncle        lung   Social History   Socioeconomic History  . Marital status: Married    Spouse name: Not on file  . Number of children: 1  . Years of education: COLLEGE1  . Highest  education level: Not on file  Occupational History  . Occupation: HOUSEWIFE    Employer: UNEMPLOYED  Social Needs  . Financial resource strain: Not on file  . Food insecurity:    Worry: Not on file    Inability: Not on file  . Transportation needs:    Medical: Not on file    Non-medical: Not on file  Tobacco Use  . Smoking status: Never Smoker  . Smokeless tobacco: Never Used  Substance and Sexual Activity  . Alcohol use: No  . Drug use: No  . Sexual activity: Not on file  Lifestyle  . Physical activity:    Days per week: Not on file    Minutes per session: Not on file  . Stress: Not on file  Relationships  . Social connections:    Talks on phone: Not on file     Gets together: Not on file    Attends religious service: Not on file    Active member of club or organization: Not on file    Attends meetings of clubs or organizations: Not on file    Relationship status: Not on file  Other Topics Concern  . Not on file  Social History Narrative   Patient is right handed.   Patient drinks caffeine occasionally.   Past Surgical History:  Procedure Laterality Date  .  kidney stones    . ABDOMINAL ADHESION SURGERY    . ABDOMINAL HYSTERECTOMY  1995   partial- hemmoraged after surgery-stitch came loose  . APPENDECTOMY  1993   hemmoraged after surgery- stitch came loose  . CERVICAL DISC SURGERY  06/04/2001   with fusion  . CHOLECYSTECTOMY  1985  . colonoscopy    . COLONOSCOPY    . CYSTOSCOPY    . FOOT SURGERY     lt  . jj stent  07/2002   with ureteroscopy, cystoscopy and then removal of stent in office  . LITHOTRIPSY    . LYMPH NODE BIOPSY Right 03/06/2014   Procedure: right groin LYMPH NODE BIOPSY;  Surgeon: Merrie Roof, MD;  Location: [place] [facility];  Service: General;  Laterality: Right;  . LYMPHADENECTOMY N/A 12/29/2015   Procedure: RETROPERITONEAL LYMPHADENECTOMY;  Surgeon: Alexis Frock, MD;  [facility];  Service: Urology;  Laterality: N/A;  . NECK SURGERY  2002  . ROBOT ASSISTED LAPAROSCOPIC NEPHRECTOMY Left 12/29/2015   Procedure: XI ROBOTIC ASSISTED LEFT LAPAROSCOPIC NEPHRECTOMY;  Surgeon: Alexis Frock, MD;  [facility];  Service: Urology;  Laterality: Left;  . TONSILLECTOMY  1976   Past Medical History:  Diagnosis Date  . Anxiety   . Arthritis   . Asthma   . Bipolar affective ([place])   . Calcifying tendinitis of shoulder   . Carpal tunnel syndrome   . Cervical facet syndrome   . Chronic pain syndrome   . Complication of anesthesia   . Depression   . Diverticulosis   . Falls   . Fibromyalgia   . 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 Full dentures   . Gout   .  Headache(784.0)    migraines  . Herpesviral infection   . Hypertension   . IBS (irritable bowel syndrome)    with diarrhea  . Lymphadenopathy   . Myalgia and myositis, unspecified   . PONV (postoperative nausea and vomiting)   . PTSD (post-traumatic stress disorder)   . Renal calculi   . Restless legs syndrome (RLS)   . Thoracic radiculopathy   .  Vitamin D deficiency   . Wears glasses    BP 121/73   Pulse 96   Temp 98.7 F (37.1 C)   Ht 5\' 4"  (1.626 m)   Wt 154 lb 12.8 oz (70.2 kg)   BMI 26.57 kg/m   Opioid Risk Score:   Fall Risk Score:  `1  Depression screen PHQ 2/9  Depression screen [facility] 2/9 03/06/2019 01/07/2019 11/21/2018 11/12/2018 11/05/2018 10/03/2018 07/12/2018  Decreased Interest 1 1 1 1 1 1 1   Down, Depressed, Hopeless 1 1 1 1  0 1 1  PHQ - 2 Score 2 2 2 2 1 2 2   Altered sleeping - - 1 1 0 - -  Tired, decreased energy - - 2 1 2  - -  Change in appetite - - 1 1 1  - -  Feeling bad or failure about yourself  - - 0 0 0 - -  Trouble concentrating - - 1 1 0 - -  Moving slowly or fidgety/restless - - 1 1 1  - -  Suicidal thoughts - - 0 0 0 - -  PHQ-9 Score - - 8 7 5  - -  Difficult doing work/chores - - Somewhat difficult Somewhat difficult Somewhat difficult - -  Some recent data might be hidden    Review of Systems  Constitutional: Positive for fever.  HENT: Positive for postnasal drip and sore throat.   Eyes: Negative.   Respiratory: Positive for cough.   Cardiovascular: Negative.   Gastrointestinal: Positive for constipation.  Endocrine: Negative.   Genitourinary: Negative.   Musculoskeletal: Positive for arthralgias, back pain, gait problem and neck pain.  Skin: Negative.   Allergic/Immunologic: Negative.   Psychiatric/Behavioral: Positive for dysphoric mood. The patient is nervous/anxious.   All other systems reviewed and are negative.      Objective:   Physical Exam Vitals signs and nursing note reviewed.  Musculoskeletal:     Comments: No Physical Exam  Performed: Virtual Visit  Neurological:     Mental Status: She is oriented to person, place, and time.           Assessment & Plan:  1. History of fibromyalgia with myofascial pain and multiple trigger points.Continue with Heat and exercise Regime. Continue withcurrent medication regimen withgabapentin. 03/06/2019 2. Chronic migraine headaches.Continue to Monitor. S/P Botox.on 05/30/2017 03/06/2019 3. Midline Low Back Pain/ Lumbar Spondylosis/Lumbar degenerative disk disease, L4-5/ Lumbar Radiculitis::Continue with HEP and current medication regimen. Refilled: Fentanyl Patch 25 mcg one patch every three days #10 and ContinueHydrocodone 5/325 mg one tablet every 6 hours as needed #90. 03/06/2019. 4. 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 Urology Following. 03/06/2019. 5. 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 03/06/2019 6. Cervicalgia/ Cervical RadiculitisCervical Spondylosis: Continuecurrent medication regiment withGabapentin. Continue with HEP and Continue to monitor. 03/06/2019 7. Muscle Spasm: Continuecurrent medication regiment withFlexeril as needed. 03/06/2019 9. Bilateral Greater Trochanter Bursitis: Continue to alternate with ice and heat therapy. Continue HEP as Tolerated. Continue to monitor.03/06/2019  F/U in 1 month  Telephone Call  Location of patient: In Her Home  Location of provider: Office Established patient Time spent on call: 10 Minutes

## 2019-03-11 ENCOUNTER — Ambulatory Visit: Payer: 59 | Admitting: Physical Medicine & Rehabilitation

## 2019-03-11 ENCOUNTER — Ambulatory Visit: Payer: 59 | Admitting: Nurse Practitioner

## 2019-03-20 ENCOUNTER — Telehealth: Payer: Self-pay | Admitting: Physical Medicine & Rehabilitation

## 2019-03-20 MED ORDER — HYDROCODONE-ACETAMINOPHEN 5-325 MG PO TABS: 1.0000 | ORAL_TABLET | Freq: Three times a day (TID) | ORAL | 0 refills | Status: DC | PRN

## 2019-03-20 NOTE — Telephone Encounter (Signed)
Patient was told by Patty Salas to touch base with her this week in regards to her Norco refill.  I explained to her that if she has 24 pills left,maybe she should wait until she is about a weeks worth down and call for refill.  Please advise.

## 2019-03-20 NOTE — Telephone Encounter (Signed)
Return Patty Salas call, Hydrocodone e-scribed with post dated date, she verbalizes understanding.

## 2019-03-26 ENCOUNTER — Telehealth: Payer: Self-pay | Admitting: Neurology

## 2019-03-26 NOTE — Telephone Encounter (Signed)
Pt called wanting RN to call her back to discuss medications and her upcoming appt as well. Please advise.

## 2019-03-26 NOTE — Telephone Encounter (Signed)
I reached out the pt and had an extensive conversation. Pt states she will only do a telephone visit on 04/10/19.    Pt understands that although there may be some limitations with this type of visit, we will take all precautions to reduce any security or privacy concerns.  Pt understands that this will be treated like an in office visit and we will file with pt's insurance, and there may be a patient responsible charge related to this service.  Pt states no refills are needed at this time. Chart has been updated for 04/10/19 visit.

## 2019-03-26 NOTE — Telephone Encounter (Signed)
LVM asking pt to call me back to further discuss.   [facility] # and hours have been provided.

## 2019-03-31 ENCOUNTER — Telehealth: Payer: Self-pay

## 2019-03-31 ENCOUNTER — Ambulatory Visit: Payer: 59

## 2019-03-31 ENCOUNTER — Ambulatory Visit: Payer: 59 | Admitting: Podiatry

## 2019-03-31 ENCOUNTER — Other Ambulatory Visit: Payer: Self-pay

## 2019-03-31 ENCOUNTER — Encounter: Payer: Self-pay | Admitting: Podiatry

## 2019-03-31 DIAGNOSIS — Q828 Other specified congenital malformations of skin: Secondary | ICD-10-CM | POA: Diagnosis not present

## 2019-03-31 DIAGNOSIS — M79672 Pain in left foot: Secondary | ICD-10-CM

## 2019-03-31 DIAGNOSIS — M779 Enthesopathy, unspecified: Secondary | ICD-10-CM | POA: Diagnosis not present

## 2019-03-31 NOTE — Patient Instructions (Signed)

## 2019-03-31 NOTE — Telephone Encounter (Signed)
Pt called and asked about injection received. Pt was informed it was a 1cc Marcaine injection.

## 2019-04-01 ENCOUNTER — Telehealth: Payer: Self-pay

## 2019-04-01 NOTE — Telephone Encounter (Signed)
Patient called today, stated accidentally scrubbed off a fentanyl patch while in the shower and is having problems with it now adhering to her skin.  Called her back and recommended that she can try using coban or ace wrap to hold in place since she has problems with certain adhesives and skin issus. Stated she will try this method and let us know how it works for her.

## 2019-04-01 NOTE — Progress Notes (Signed)
Subjective:   Patient ID: Patty Salas, female   DOB: 64 y.o.   MRN: [id number]   HPI Patient presents with a lot of pain in the outside of the left foot and also lesion formation that becomes painful both feet   ROS      Objective:  Physical Exam  Neurovascular status unchanged with patient in very poor health history of fibromyalgia with inflammation of the peroneal tendon left insertion base fifth metatarsal and chronic lesion formation bilateral     Assessment:  Tendinitis and porokeratotic lesion     Plan:  H&P conditions reviewed and today I did a sterile prep and injected the lateral tendon complex 3 mg Dexasone Kenalog 5 mg Xylocaine and debrided the lesions on both feet with no iatrogenic bleeding and reappoint to recheck  X-ray dated today indicates no signs of fracture associated with this with mild osteoporosis arthritis

## 2019-04-10 ENCOUNTER — Encounter: Payer: Self-pay | Admitting: Neurology

## 2019-04-10 ENCOUNTER — Ambulatory Visit (INDEPENDENT_AMBULATORY_CARE_PROVIDER_SITE_OTHER): Payer: 59 | Admitting: Neurology

## 2019-04-10 DIAGNOSIS — G43519 Persistent migraine aura without cerebral infarction, intractable, without status migrainosus: Secondary | ICD-10-CM | POA: Diagnosis not present

## 2019-04-10 MED ORDER — ALMOTRIPTAN MALATE 12.5 MG PO TABS: ORAL_TABLET | ORAL | 0 refills | Status: DC

## 2019-04-10 MED ORDER — AIMOVIG 140 MG/ML ~~LOC~~ SOAJ: 140.0000 mg | SUBCUTANEOUS | 4 refills | Status: DC

## 2019-04-10 NOTE — Progress Notes (Signed)
     Virtual Visit via Telephone Note  I connected with Patty Salas on 04/10/19 at  1:30 PM EDT by telephone and verified that I am speaking with the correct person using two identifiers.  Location: Patient: The patient is at home. Provider: The physician is in the office.   I discussed the limitations, risks, security and privacy concerns of performing an evaluation and management service by telephone and the availability of in person appointments. I also discussed with the patient that there may be a patient responsible charge related to this service. The patient expressed understanding and agreed to proceed.   History of Present Illness: Patty Salas is a 63 year old right handed white female with a history of intractable migraine headache. She reports that the headaches are activated by weather changes and by emotional stress. She is trying to get off of the hydrocodone and eventually the fentanyl and the ativan. She will take Axert if needed, no more than twice a week. She denies a lot of nausea with the headache. She does have a history of renal cell carcinoma that is in remission, she has had multiple renal calculi.  She reports a recent diagnosis of right de Quervain's tenosynovitis.  She has history of episodic vertigo but this has been improved by the use of Epley maneuvers.  The patient has been on a multitude of medications for her migraine that have been not been tolerated or have not been effective.  She did not tolerate Botox injections.   Observations/Objective: On the telephone evaluation the patient is somewhat circumlocutous with tangential speech.  She otherwise appears to be relatively oriented and cooperative.  Speech is well enunciated, not aphasic or dysarthric.  She claims that her blood pressure is 106/72 with a heart rate of 95 today with a weight of 151 pounds.  Assessment and Plan: 1.  Intractable migraine headache  2.  Episodic vertigo  The patient will be  tried on Aimovig for her headaches as a preventative.  She will continue on the Axert if needed.  The patient will follow-up in 3 months.  Follow Up Instructions: 47-month follow-up, may see nurse practitioner.   I discussed the assessment and treatment plan with the patient. The patient was provided an opportunity to ask questions and all were answered. The patient agreed with the plan and demonstrated an understanding of the instructions.   The patient was advised to call back or seek an in-person evaluation if the symptoms worsen or if the condition fails to improve as anticipated.  I provided 25 minutes of non-face-to-face time during this encounter.   Kathrynn Ducking, MD

## 2019-04-13 ENCOUNTER — Other Ambulatory Visit: Payer: Self-pay | Admitting: Registered Nurse

## 2019-04-14 NOTE — Telephone Encounter (Signed)
Patient requesting refill on Methyprednisone. Stating hand is swollen and warm to the touch. Not on med list. Is it ok to refill?

## 2019-04-16 ENCOUNTER — Telehealth: Payer: Self-pay | Admitting: *Deleted

## 2019-04-16 NOTE — Telephone Encounter (Signed)
Patty Salas called again about her wrist/hand and asking about the streroid.  [facility] sent you the refill request and it was denied. I just need to know what to tell her when I call her back.

## 2019-04-16 NOTE — Telephone Encounter (Signed)
The problem is that we can't just have a standing order or refills for steroids without the patient being seen. The use of steroids should not be taking lightly in a post-menopausal female. Trying to look out for her even though I know she still has pain.

## 2019-04-17 NOTE — Telephone Encounter (Signed)
I notified Joselyn. She understands.

## 2019-04-30 ENCOUNTER — Encounter: Payer: Self-pay | Admitting: Physical Medicine & Rehabilitation

## 2019-04-30 ENCOUNTER — Encounter: Payer: 59 | Attending: Physical Medicine and Rehabilitation | Admitting: Physical Medicine & Rehabilitation

## 2019-04-30 ENCOUNTER — Other Ambulatory Visit: Payer: Self-pay

## 2019-04-30 DIAGNOSIS — M797 Fibromyalgia: Secondary | ICD-10-CM | POA: Insufficient documentation

## 2019-04-30 DIAGNOSIS — G43519 Persistent migraine aura without cerebral infarction, intractable, without status migrainosus: Secondary | ICD-10-CM | POA: Diagnosis present

## 2019-04-30 DIAGNOSIS — F0781 Postconcussional syndrome: Secondary | ICD-10-CM | POA: Diagnosis present

## 2019-04-30 DIAGNOSIS — M4716 Other spondylosis with myelopathy, lumbar region: Secondary | ICD-10-CM | POA: Diagnosis present

## 2019-04-30 DIAGNOSIS — M5136 Other intervertebral disc degeneration, lumbar region: Secondary | ICD-10-CM | POA: Diagnosis present

## 2019-04-30 DIAGNOSIS — M47812 Spondylosis without myelopathy or radiculopathy, cervical region: Secondary | ICD-10-CM

## 2019-04-30 DIAGNOSIS — Z5181 Encounter for therapeutic drug level monitoring: Secondary | ICD-10-CM | POA: Insufficient documentation

## 2019-04-30 DIAGNOSIS — M7918 Myalgia, other site: Secondary | ICD-10-CM | POA: Diagnosis not present

## 2019-04-30 DIAGNOSIS — G894 Chronic pain syndrome: Secondary | ICD-10-CM | POA: Diagnosis not present

## 2019-04-30 DIAGNOSIS — Z79899 Other long term (current) drug therapy: Secondary | ICD-10-CM | POA: Insufficient documentation

## 2019-04-30 MED ORDER — HYDROCODONE-ACETAMINOPHEN 5-325 MG PO TABS: 1.0000 | ORAL_TABLET | Freq: Three times a day (TID) | ORAL | 0 refills | Status: DC | PRN

## 2019-04-30 MED ORDER — CYCLOBENZAPRINE HCL 10 MG PO TABS: 10.0000 mg | ORAL_TABLET | Freq: Three times a day (TID) | ORAL | 4 refills | Status: DC

## 2019-04-30 MED ORDER — FENTANYL 25 MCG/HR TD PT72: 1.0000 | MEDICATED_PATCH | TRANSDERMAL | 0 refills | Status: DC

## 2019-04-30 MED ORDER — METHYLPREDNISOLONE 4 MG PO TBPK: ORAL_TABLET | ORAL | 0 refills | Status: DC

## 2019-04-30 NOTE — Patient Instructions (Signed)
PLEASE FEEL FREE TO CALL OUR OFFICE WITH ANY PROBLEMS OR QUESTIONS ([phone])      

## 2019-04-30 NOTE — Addendum Note (Signed)
Addended by: Alger Simons T on: 04/30/2019 02:26 PM   Modules accepted: Orders

## 2019-04-30 NOTE — Progress Notes (Signed)
PROCEDURE NOTE  DX: myofascial pain refractory to conservative measures, stretching, etc.    After informed consent and preparation of the skin with isopropyl alcohol, I injected the bilateral traps and lumbar paraspinals (4 in total) each with 2cc of 1% lidocaine. The patient tolerated well, and no complications were experienced. Post-injection instructions were provided.    RF'ed fentanyl 79mc patch. RF'ed hydrocodone 5mg  but reduced to #75. F/u with NP in a month.    ?NCS for ? CTS RUE and/or dequervain's tenosynovitis. Consider injection as well Provided medrol dose pack x1 for wristr pain.

## 2019-05-21 ENCOUNTER — Telehealth: Payer: Self-pay | Admitting: Neurology

## 2019-05-21 NOTE — Telephone Encounter (Signed)
Pt called in and stated she would like a call back to discuss meds that were called in , in July and why she is not wanting to take it , she also stated she needs to speak with someone about her pain in her arm. She is requesting a call back to discuss

## 2019-05-21 NOTE — Telephone Encounter (Signed)
Pt called back wanting to add if she is needing to schedule an appt as well with NP or Provider. Please advise.

## 2019-05-21 NOTE — Telephone Encounter (Signed)
I spoke to the patient who has decided against starting Aimovig due to her Latex allergy.  She would like to know if there is an alternate preventive medication.  If the risk would be minimal, she would be willing to come to the office for her first injection (she has epipen).  She would like for Dr. Jannifer Franklin to address this question and is willing to wait until he returns to the office.    She is under the care of Dr. Alger Simons for her arm pain and is agreeable to discuss next step with him.

## 2019-05-25 NOTE — Telephone Encounter (Signed)
I called the patient.  The patient has an impressive list of medication allergies totaling 58 currently.  She indicates that she has a severe latex allergy, she does have an EpiPen injector.  I will be happy to have her come into our office to get the first injection of Aimovig in our office.

## 2019-05-26 ENCOUNTER — Telehealth: Payer: Self-pay | Admitting: Family Medicine

## 2019-05-26 DIAGNOSIS — Z20822 Contact with and (suspected) exposure to covid-19: Secondary | ICD-10-CM

## 2019-05-26 NOTE — Telephone Encounter (Signed)
I reached out to the pt and we scheduled her injection for 06/25/2019 at 2 pm. Pt is struggling with IBS right now and is running a temp 100.4 currently. Pt is going to call her GI MD for further treatment of this IBS flare up.

## 2019-05-26 NOTE — Telephone Encounter (Signed)
Please call pt and advise that COVID test ordered and provide pt with testing site information.  Charyl Bigger, CMA

## 2019-05-26 NOTE — Telephone Encounter (Signed)
Patient called states over weekend had a temp of 100.6 but has been reduced w/ Rx & Tylenol to 99.9/ 99.8 was instructed to contact her her PCP ----- She has been feeling bad & she thought maybe flu symptoms  (Denies COVID 19) but also says has IBS (suggested pt contact her GI provider & advised would forward message to medical assistant for review w/ provider  & to contact her back w/ advice @ [phone])-  --glh

## 2019-05-27 ENCOUNTER — Telehealth: Payer: Self-pay | Admitting: *Deleted

## 2019-05-27 ENCOUNTER — Other Ambulatory Visit: Payer: Self-pay

## 2019-05-27 DIAGNOSIS — M7918 Myalgia, other site: Secondary | ICD-10-CM

## 2019-05-27 DIAGNOSIS — G894 Chronic pain syndrome: Secondary | ICD-10-CM

## 2019-05-27 DIAGNOSIS — M47812 Spondylosis without myelopathy or radiculopathy, cervical region: Secondary | ICD-10-CM

## 2019-05-27 DIAGNOSIS — M797 Fibromyalgia: Secondary | ICD-10-CM

## 2019-05-27 DIAGNOSIS — M4716 Other spondylosis with myelopathy, lumbar region: Secondary | ICD-10-CM

## 2019-05-27 MED ORDER — HYDROCODONE-ACETAMINOPHEN 5-325 MG PO TABS: 1.0000 | ORAL_TABLET | Freq: Three times a day (TID) | ORAL | 0 refills | Status: DC | PRN

## 2019-05-27 MED ORDER — FENTANYL 25 MCG/HR TD PT72: 1.0000 | MEDICATED_PATCH | TRANSDERMAL | 0 refills | Status: DC

## 2019-05-27 NOTE — Telephone Encounter (Signed)
PMP was reviewed: Medications E-scribed. Patty Salas had COVID screening ordered on 05/26/2019. Patty Salas is aware to call office with her results. Patty Salas spoke with Patty Salas today, she verbalizes understanding.

## 2019-05-27 NOTE — Telephone Encounter (Signed)
Patty Salas is running a low grade fever and will not be able to come into the clinic.  She filled her Fentanyl on 05/07/19 and her hydrocodone 04/30/19 but she has # 30 pills left. She will need an appointment for at least 14 days out to account for quarantine which she is doing herself (buther PCP has ordered a covid test).  He husband got checked by his PCP and is negative and even though she does not go anywhere, with her symptoms she will not be allowed to come into the office for a visit. She was seen by DR Naaman Plummer on 04/30/19, so I have asked Judeen Hammans to schedule for next month to be on the safe side around 9/25-10/2 and can you refill meds please by 06/05/19.?

## 2019-05-27 NOTE — Telephone Encounter (Signed)
Patient had to cancel Thursdays appt due to fever. Needs refill on Norco and Fentanyl. She states she has #30 Norco left last filled 04/30/2019 and Fentanyl last filled 05/07/2019

## 2019-05-28 ENCOUNTER — Encounter: Payer: 59 | Admitting: Registered Nurse

## 2019-06-10 ENCOUNTER — Telehealth: Payer: Self-pay | Admitting: Family Medicine

## 2019-06-10 NOTE — Telephone Encounter (Signed)
Noted.  T. Neko Mcgeehan, CMA 

## 2019-06-10 NOTE — Telephone Encounter (Signed)
Pt called to advice provider that she is seeing Dr. Tresa Moore @ [facility] to treat her UTI.  --Forwarding message to clinical staff.  -glh

## 2019-06-23 ENCOUNTER — Telehealth: Payer: Self-pay

## 2019-06-23 NOTE — Telephone Encounter (Signed)
Pt called. States she is not feeling well and is running a low-grade fever. Canceleed her appt.

## 2019-06-25 ENCOUNTER — Ambulatory Visit: Payer: Self-pay

## 2019-07-01 ENCOUNTER — Ambulatory Visit: Payer: 59 | Admitting: Registered Nurse

## 2019-07-03 ENCOUNTER — Other Ambulatory Visit: Payer: Self-pay | Admitting: Registered Nurse

## 2019-07-03 ENCOUNTER — Encounter: Payer: 59 | Admitting: Registered Nurse

## 2019-07-03 DIAGNOSIS — G894 Chronic pain syndrome: Secondary | ICD-10-CM

## 2019-07-03 DIAGNOSIS — M7918 Myalgia, other site: Secondary | ICD-10-CM

## 2019-07-03 DIAGNOSIS — M797 Fibromyalgia: Secondary | ICD-10-CM

## 2019-07-03 DIAGNOSIS — M47812 Spondylosis without myelopathy or radiculopathy, cervical region: Secondary | ICD-10-CM

## 2019-07-03 DIAGNOSIS — M4716 Other spondylosis with myelopathy, lumbar region: Secondary | ICD-10-CM

## 2019-07-03 NOTE — Telephone Encounter (Signed)
Patty Salas called this morning with a fever and headache. She plans on going to take COVID test.  I rescheduled her appointment to 10/15 but she will be out of medication 10/4. She said she can do a phone visit if you need to. Please advise

## 2019-07-03 NOTE — Telephone Encounter (Signed)
I need to know what day she is going to take the COVID- 19 swab, ask her to call office when she get's her results.

## 2019-07-04 MED ORDER — HYDROCODONE-ACETAMINOPHEN 5-325 MG PO TABS: 1.0000 | ORAL_TABLET | Freq: Three times a day (TID) | ORAL | 0 refills | Status: DC | PRN

## 2019-07-04 MED ORDER — FENTANYL 25 MCG/HR TD PT72: 1.0000 | MEDICATED_PATCH | TRANSDERMAL | 0 refills | Status: DC

## 2019-07-04 NOTE — Telephone Encounter (Signed)
Patty Salas, Patty Salas office yesterday reporting she's having diarrhea and will be going for  COVID-19 Swab on Monday 07/07/2019. Medications e-scribed, PMP was reviewed. Patty Salas was instructed to call office to schedule appointment once she receive  her results. Office staff will call Patty Salas today regarding the above.

## 2019-07-09 ENCOUNTER — Telehealth: Payer: Self-pay

## 2019-07-09 NOTE — Telephone Encounter (Signed)
Patient called today requesting a call back from [facility] in regards to a having possible Flu-like symptoms and attempting to get tested at [facility] clinic.

## 2019-07-09 NOTE — Telephone Encounter (Signed)
Bruce, please call Ms. Polito and tell her to call her PCP with her symptoms. She was supposed to have a COVID test, did she go for the swab. Please let her know I'm off today.

## 2019-07-14 ENCOUNTER — Telehealth: Payer: Self-pay | Admitting: Registered Nurse

## 2019-07-14 NOTE — Telephone Encounter (Signed)
Return Ms. Govier call, she states she had a Migraine this morning and had taken phenergan, this is the reason why she didn't go for her COVID-19 Testing. She was instructed to go for her swab in the morning as she reported. She is aware we will not e-scribe her medication again this month without her being testing, she verbalizes understanding.

## 2019-07-17 ENCOUNTER — Encounter: Payer: 59 | Admitting: Registered Nurse

## 2019-07-22 ENCOUNTER — Telehealth: Payer: Self-pay

## 2019-07-22 NOTE — Telephone Encounter (Signed)
PA has been approved for Aimovig. PA effective 07/22/19- 07/21/20.

## 2019-07-22 NOTE — Telephone Encounter (Signed)
PA for Aimovig has been submitted.  (Key: [id number])  Your information has been submitted to [place]. To check for an updated outcome later, reopen this PA request from your dashboard.  If [employer] has not responded to your request within 24 hours, contact [place] at [phone]. If you think there may be a problem with your PA request, use our live chat feature at the bottom right.

## 2019-07-24 ENCOUNTER — Other Ambulatory Visit: Payer: Self-pay

## 2019-07-24 DIAGNOSIS — Z20822 Contact with and (suspected) exposure to covid-19: Secondary | ICD-10-CM

## 2019-07-26 LAB — NOVEL CORONAVIRUS, NAA: SARS-CoV-2, NAA: NOT DETECTED

## 2019-07-31 ENCOUNTER — Encounter: Payer: 59 | Admitting: Registered Nurse

## 2019-08-05 ENCOUNTER — Encounter: Payer: Self-pay | Admitting: Registered Nurse

## 2019-08-05 ENCOUNTER — Other Ambulatory Visit: Payer: Self-pay

## 2019-08-05 ENCOUNTER — Encounter: Payer: 59 | Attending: Registered Nurse | Admitting: Registered Nurse

## 2019-08-05 VITALS — BP 144/77 | HR 99 | Temp 97.5°F | Ht 65.5 in | Wt 151.0 lb

## 2019-08-05 DIAGNOSIS — M7061 Trochanteric bursitis, right hip: Secondary | ICD-10-CM | POA: Diagnosis present

## 2019-08-05 DIAGNOSIS — G8929 Other chronic pain: Secondary | ICD-10-CM | POA: Diagnosis present

## 2019-08-05 DIAGNOSIS — Z5181 Encounter for therapeutic drug level monitoring: Secondary | ICD-10-CM | POA: Insufficient documentation

## 2019-08-05 DIAGNOSIS — M797 Fibromyalgia: Secondary | ICD-10-CM | POA: Diagnosis present

## 2019-08-05 DIAGNOSIS — M4716 Other spondylosis with myelopathy, lumbar region: Secondary | ICD-10-CM | POA: Insufficient documentation

## 2019-08-05 DIAGNOSIS — G894 Chronic pain syndrome: Secondary | ICD-10-CM | POA: Diagnosis present

## 2019-08-05 DIAGNOSIS — M47812 Spondylosis without myelopathy or radiculopathy, cervical region: Secondary | ICD-10-CM | POA: Insufficient documentation

## 2019-08-05 DIAGNOSIS — G43519 Persistent migraine aura without cerebral infarction, intractable, without status migrainosus: Secondary | ICD-10-CM | POA: Insufficient documentation

## 2019-08-05 DIAGNOSIS — Z79891 Long term (current) use of opiate analgesic: Secondary | ICD-10-CM | POA: Diagnosis present

## 2019-08-05 DIAGNOSIS — M7062 Trochanteric bursitis, left hip: Secondary | ICD-10-CM | POA: Insufficient documentation

## 2019-08-05 DIAGNOSIS — M546 Pain in thoracic spine: Secondary | ICD-10-CM | POA: Insufficient documentation

## 2019-08-05 DIAGNOSIS — M7918 Myalgia, other site: Secondary | ICD-10-CM | POA: Diagnosis present

## 2019-08-05 DIAGNOSIS — M5412 Radiculopathy, cervical region: Secondary | ICD-10-CM | POA: Insufficient documentation

## 2019-08-05 DIAGNOSIS — M542 Cervicalgia: Secondary | ICD-10-CM | POA: Diagnosis present

## 2019-08-05 MED ORDER — FENTANYL 25 MCG/HR TD PT72: 1.0000 | MEDICATED_PATCH | TRANSDERMAL | 0 refills | Status: DC

## 2019-08-05 MED ORDER — HYDROCODONE-ACETAMINOPHEN 5-325 MG PO TABS: 1.0000 | ORAL_TABLET | Freq: Three times a day (TID) | ORAL | 0 refills | Status: DC | PRN

## 2019-08-05 NOTE — Progress Notes (Signed)
Subjective:    Patient ID: Patty Salas, female    DOB: [birth year], 63 y.o.   MRN: [id number]  HPI: [name] is a 63 y.o. female who returns for follow up appointment for chronic pain and medication refill. She states her pain is located in her neck radiating into her bilateral shoulders, mid-lower back pain radiating into her bilateral hips and bilateral lower extremities. She rates her pain 10. Her  current exercise regime is walking and performing stretching exercises.  Ms. Rauseo Morphine equivalent is 75.00 MME She is also prescribed Lorazepam by Dr. Reece Levy. We have discussed the black box warning of using opioids and benzodiazepines. I highlighted the dangers of using these drugs together and discussed the adverse events including respiratory suppression, overdose, cognitive impairment and importance of compliance with current regimen. We will continue to monitor and adjust as indicated.  She  is being closely monitored and under the care of her psychiatrist Dr. Reece Levy.    UDS ordered today.   Pain Inventory Average Pain 10 Pain Right Now 10 My pain is sharp, burning, stabbing, tingling and aching  In the last 24 hours, has pain interfered with the following? General activity 10 Relation with others 10 Enjoyment of life 10 What TIME of day is your pain at its worst? all Sleep (in general) Fair  Pain is worse with: walking, bending, sitting, inactivity, standing and some activites Pain improves with: rest, heat/ice, therapy/exercise, medication and injections Relief from Meds: 5  Mobility walk with assistance use a cane use a walker how many minutes can you walk? 5-10 ability to climb steps?  yes do you drive?  yes  Function not employed: date last employed . disabled: date disabled . I need assistance with the following:  meal prep, household duties and shopping  Neuro/Psych bladder control problems bowel control problems weakness numbness tremor tingling  trouble walking spasms dizziness confusion depression anxiety  Prior Studies Any changes since last visit?  no  Physicians involved in your care Any changes since last visit?  no   Family History  Problem Relation Age of Onset  . Cancer Mother        stomach  . Migraines Mother   . Arthritis Mother   . Emphysema Mother   . Heart disease Father   . Hypertension Father   . Cancer Father        colon  . Dementia Father   . Hypertension Brother   . Migraines Brother   . Hypertension Brother   . Migraines Brother   . Alcohol abuse Brother   . Bipolar disorder Brother   . Migraines Daughter   . Arthritis Maternal Grandmother   . Cancer Maternal Grandmother        stomach  . Diabetes Maternal Grandmother   . Stroke Maternal Grandmother   . Cancer Maternal Aunt        lung  . Emphysema Maternal Aunt   . Cancer Paternal Uncle        colon  . Hypertension Maternal Aunt   . Heart disease Maternal Aunt   . Cancer Paternal Uncle        lung   Social History   Socioeconomic History  . Marital status: Married    Spouse name: Not on file  . Number of children: 1  . Years of education: COLLEGE1  . Highest education level: Not on file  Occupational History  . Occupation: HOUSEWIFE    Employer: UNEMPLOYED  Social Needs  .  Financial resource strain: Not on file  . Food insecurity    Worry: Not on file    Inability: Not on file  . Transportation needs    Medical: Not on file    Non-medical: Not on file  Tobacco Use  . Smoking status: Never Smoker  . Smokeless tobacco: Never Used  Substance and Sexual Activity  . Alcohol use: No  . Drug use: No  . Sexual activity: Not on file  Lifestyle  . Physical activity    Days per week: Not on file    Minutes per session: Not on file  . Stress: Not on file  Relationships  . Social Herbalist on phone: Not on file    Gets together: Not on file    Attends religious service: Not on file    Active member of  club or organization: Not on file    Attends meetings of clubs or organizations: Not on file    Relationship status: Not on file  Other Topics Concern  . Not on file  Social History Narrative   Patient is right handed.   Patient drinks caffeine occasionally.   Past Surgical History:  Procedure Laterality Date  .  kidney stones    . ABDOMINAL ADHESION SURGERY    . ABDOMINAL HYSTERECTOMY  1995   partial- hemmoraged after surgery-stitch came loose  . APPENDECTOMY  1993   hemmoraged after surgery- stitch came loose  . CERVICAL DISC SURGERY  06/04/2001   with fusion  . CHOLECYSTECTOMY  1985  . colonoscopy    . COLONOSCOPY    . CYSTOSCOPY    . FOOT SURGERY     lt  . jj stent  07/2002   with ureteroscopy, cystoscopy and then removal of stent in office  . LITHOTRIPSY    . LYMPH NODE BIOPSY Right 03/06/2014   Procedure: right groin LYMPH NODE BIOPSY;  Surgeon: Merrie Roof, MD;  Location: [place];  Service: General;  Laterality: Right;  . LYMPHADENECTOMY N/A 12/29/2015   Procedure: RETROPERITONEAL LYMPHADENECTOMY;  Surgeon: Alexis Frock, MD;  Location: [facility];  Service: Urology;  Laterality: N/A;  . NECK SURGERY  2002  . ROBOT ASSISTED LAPAROSCOPIC NEPHRECTOMY Left 12/29/2015   Procedure: XI ROBOTIC ASSISTED LEFT LAPAROSCOPIC NEPHRECTOMY;  Surgeon: Alexis Frock, MD;  Location: [facility];  Service: Urology;  Laterality: Left;  . TONSILLECTOMY  1976   Past Medical History:  Diagnosis Date  . Anxiety   . Arthritis   . Asthma   . Bipolar 2 disorder ([place])    pt stated, "I have Bipolar 2 and it is remission"  . Bipolar affective ([place])   . Calcifying tendinitis of shoulder   . Carpal tunnel syndrome   . Cervical facet syndrome   . Chronic pain syndrome   . Complication of anesthesia   . Depression   . Diverticulosis   . Falls   . Fibromyalgia   . 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 Full dentures   . Gout   .  Headache(784.0)    migraines  . Herpesviral infection   . Hypertension   . IBS (irritable bowel syndrome)    with diarrhea  . Lymphadenopathy   . Myalgia and myositis, unspecified   . PONV (postoperative nausea and vomiting)   . PTSD (post-traumatic stress disorder)   . Renal calculi   . Restless legs syndrome (RLS)   . Thoracic radiculopathy   .  Vitamin D deficiency   . Wears glasses    BP (!) 142/88   Pulse (!) 107   Temp (!) 97.5 F (36.4 C)   Ht 5' 5.5" (1.664 m)   Wt 151 lb (68.5 kg)   SpO2 96%   BMI 24.75 kg/m   Opioid Risk Score:   Fall Risk Score:  `1  Depression screen PHQ 2/9  Depression screen [facility] 2/9 04/30/2019 03/06/2019 01/07/2019 11/21/2018 11/12/2018 11/05/2018 10/03/2018  Decreased Interest 1 1 1 1 1 1 1   Down, Depressed, Hopeless 1 1 1 1 1  0 1  PHQ - 2 Score 2 2 2 2 2 1 2   Altered sleeping - - - 1 1 0 -  Tired, decreased energy - - - 2 1 2  -  Change in appetite - - - 1 1 1  -  Feeling bad or failure about yourself  - - - 0 0 0 -  Trouble concentrating - - - 1 1 0 -  Moving slowly or fidgety/restless - - - 1 1 1  -  Suicidal thoughts - - - 0 0 0 -  PHQ-9 Score - - - 8 7 5  -  Difficult doing work/chores - - - Somewhat difficult Somewhat difficult Somewhat difficult -  Some recent data might be hidden     Review of Systems  Constitutional: Positive for diaphoresis and unexpected weight change.  Respiratory: Negative for cough.   Gastrointestinal: Positive for constipation, diarrhea, nausea and vomiting.  Genitourinary: Positive for dysuria.  Musculoskeletal: Positive for gait problem.  Neurological: Positive for dizziness, weakness and numbness.  All other systems reviewed and are negative.      Objective:   Physical Exam Vitals signs and nursing note reviewed.  Constitutional:      Appearance: Normal appearance.  Neck:     Musculoskeletal: Normal range of motion and neck supple.     Comments: Cervical Paraspinal Tenderness: C-5-C-6 Cardiovascular:      Rate and Rhythm: Normal rate and regular rhythm.     Pulses: Normal pulses.     Heart sounds: Normal heart sounds.  Pulmonary:     Effort: Pulmonary effort is normal.     Breath sounds: Normal breath sounds.  Musculoskeletal:     Comments: Normal Muscle Bulk and Muscle Testing Reveals:  Upper Extremities: Full ROM and Muscle Strength 4/5 Bilateral AC Joint Tenderness  Thoracic Hypersensitivity  Lumbar Hypersensitivity Bilateral Greater Trochanteric Tenderness Lower Extremities: Full ROM and Muscle Strength 5/5 Arises from Table with ease Narrow Based Gait   Skin:    General: Skin is warm and dry.  Neurological:     Mental Status: She is alert and oriented to person, place, and time.  Psychiatric:        Mood and Affect: Mood normal.        Behavior: Behavior normal.           Assessment & Plan:  1. History of fibromyalgia with myofascial pain and multiple trigger points.Continue with Heat and exercise Regime. Continue withcurrent medication regimen withgabapentin. 08/05/2019 2. Chronic migraine headaches.Continue to Monitor. S/P Botox.on 05/30/2017 08/05/2019 3. Midline Low Back Pain/ Lumbar Spondylosis/Lumbar degenerative disk disease, L4-5/ Lumbar Radiculitis::Continue with HEP and current medication regimen. Refilled: Fentanyl Patch 25 mcg one patch every three days #10 and ContinueHydrocodone 5/325 mg one tablet every 6 hours as needed #90. 08/05/2019. 4. 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 Urology Following. 08/05/2019. 5. 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 08/05/2019 6. Cervicalgia/  Cervical RadiculitisCervical Spondylosis: Continuecurrent medication regiment withGabapentin. Continue with HEP and Continue to monitor. 08/05/2019 7. Muscle Spasm: Continuecurrent medication regiment withFlexeril as needed. 08/05/2019 9. Bilateral Greater Trochanter Bursitis: Continue to alternate with ice and heat therapy.  Continue HEP as Tolerated. Continue to monitor.08/05/2019  F/U in 1 month

## 2019-08-07 ENCOUNTER — Telehealth: Payer: Self-pay | Admitting: Neurology

## 2019-08-07 ENCOUNTER — Other Ambulatory Visit: Payer: Self-pay | Admitting: Neurology

## 2019-08-07 LAB — TOXASSURE SELECT,+ANTIDEPR,UR

## 2019-08-07 NOTE — Telephone Encounter (Signed)
Noted  

## 2019-08-07 NOTE — Telephone Encounter (Signed)
Pt called to r/s her aimovig injection.  Pt has asked that NP Jinny Blossom be the one to do it for her but also is aware if may be the available RN to do it.  This is a FYI no call back was requested about this.

## 2019-08-11 ENCOUNTER — Telehealth: Payer: Self-pay | Admitting: Neurology

## 2019-08-11 ENCOUNTER — Telehealth: Payer: Self-pay | Admitting: *Deleted

## 2019-08-11 MED ORDER — ALMOTRIPTAN MALATE 12.5 MG PO TABS: ORAL_TABLET | ORAL | 0 refills | Status: DC

## 2019-08-11 NOTE — Telephone Encounter (Signed)
Chart reviewed and refill is appropriate. Rx has been submitted.  

## 2019-08-11 NOTE — Addendum Note (Signed)
Addended by: Verlin Grills T on: 08/11/2019 11:06 AM   Modules accepted: Orders

## 2019-08-11 NOTE — Telephone Encounter (Signed)
Urine drug screen for this encounter is consistent for prescribed medication 

## 2019-08-11 NOTE — Telephone Encounter (Signed)
Pt has called back asking that her injection be r/s for the 2nd week in December 09-09-19 check in 1:45 for 2:00 injection, this is FYI, no call back requested

## 2019-08-11 NOTE — Telephone Encounter (Signed)
Pt has called for a refill on her almotriptan (AXERT) 12.5 MG tablet [facility] #[id number]

## 2019-08-13 ENCOUNTER — Ambulatory Visit: Payer: Self-pay

## 2019-09-04 ENCOUNTER — Encounter: Payer: 59 | Admitting: Registered Nurse

## 2019-09-04 ENCOUNTER — Telehealth: Payer: Self-pay | Admitting: *Deleted

## 2019-09-04 DIAGNOSIS — M4716 Other spondylosis with myelopathy, lumbar region: Secondary | ICD-10-CM

## 2019-09-04 DIAGNOSIS — M47812 Spondylosis without myelopathy or radiculopathy, cervical region: Secondary | ICD-10-CM

## 2019-09-04 DIAGNOSIS — G894 Chronic pain syndrome: Secondary | ICD-10-CM

## 2019-09-04 DIAGNOSIS — M797 Fibromyalgia: Secondary | ICD-10-CM

## 2019-09-04 DIAGNOSIS — M7918 Myalgia, other site: Secondary | ICD-10-CM

## 2019-09-04 MED ORDER — FENTANYL 25 MCG/HR TD PT72: 1.0000 | MEDICATED_PATCH | TRANSDERMAL | 0 refills | Status: DC

## 2019-09-04 MED ORDER — HYDROCODONE-ACETAMINOPHEN 5-325 MG PO TABS: 1.0000 | ORAL_TABLET | Freq: Three times a day (TID) | ORAL | 0 refills | Status: DC | PRN

## 2019-09-04 NOTE — Telephone Encounter (Signed)
Patty Salas's appointment has been cancelled today (clinician). I have let her know that we will send her medications in to her pharmacy.  She is still running 99.5 temp and we have encouraged her to address this with her PCP.  She also had a fall over a throw rug in bathroom, and she has removed the rug.  Reports no obvious injury but is wearing a soft collar for her neck that she has, and wanted [place] to know.  I have encouraged her to seek help with urgent care or ED if she starts having problems.She reports she got her fentanyl on 08/05/19 and cyclobenzaprine 11/2 and her hydrocodone was "picked up" on 08/12/19.

## 2019-09-04 NOTE — Telephone Encounter (Signed)
PMP was reviewed; Hydrocodone and Fentanyl was e-scribed today.

## 2019-09-09 ENCOUNTER — Ambulatory Visit: Payer: Self-pay

## 2019-10-01 ENCOUNTER — Encounter: Payer: 59 | Admitting: Physical Medicine & Rehabilitation

## 2019-10-01 ENCOUNTER — Telehealth: Payer: Self-pay | Admitting: *Deleted

## 2019-10-01 NOTE — Telephone Encounter (Addendum)
Patty Salas called and she still is running low grade fever and has appt with [facility] Monday 10/06/19. She also has a sore throat and ear pain.  She will call her PCP as to whether she should be tested again for covid.  She has #34 hydrocodone currently and 2 fentanyl patches and the last one to be placed Sunday 10/05/19.  I have asked her appointment be moved to a phone or webex.

## 2019-10-01 NOTE — Telephone Encounter (Signed)
[name] appointment was changed to a Virtual Visit. [name] is aware of the above.

## 2019-10-06 ENCOUNTER — Encounter: Payer: 59 | Attending: Registered Nurse | Admitting: Registered Nurse

## 2019-10-06 ENCOUNTER — Other Ambulatory Visit: Payer: Self-pay

## 2019-10-06 ENCOUNTER — Encounter: Payer: Self-pay | Admitting: Registered Nurse

## 2019-10-06 VITALS — BP 142/76 | HR 101 | Temp 98.7°F | Ht 65.0 in | Wt 150.4 lb

## 2019-10-06 DIAGNOSIS — M546 Pain in thoracic spine: Secondary | ICD-10-CM

## 2019-10-06 DIAGNOSIS — Z5181 Encounter for therapeutic drug level monitoring: Secondary | ICD-10-CM

## 2019-10-06 DIAGNOSIS — M7918 Myalgia, other site: Secondary | ICD-10-CM

## 2019-10-06 DIAGNOSIS — Z79891 Long term (current) use of opiate analgesic: Secondary | ICD-10-CM

## 2019-10-06 DIAGNOSIS — Z1331 Encounter for screening for depression: Secondary | ICD-10-CM

## 2019-10-06 DIAGNOSIS — M5412 Radiculopathy, cervical region: Secondary | ICD-10-CM

## 2019-10-06 DIAGNOSIS — M542 Cervicalgia: Secondary | ICD-10-CM

## 2019-10-06 DIAGNOSIS — R296 Repeated falls: Secondary | ICD-10-CM

## 2019-10-06 DIAGNOSIS — M7062 Trochanteric bursitis, left hip: Secondary | ICD-10-CM

## 2019-10-06 DIAGNOSIS — G43519 Persistent migraine aura without cerebral infarction, intractable, without status migrainosus: Secondary | ICD-10-CM | POA: Insufficient documentation

## 2019-10-06 DIAGNOSIS — M797 Fibromyalgia: Secondary | ICD-10-CM

## 2019-10-06 DIAGNOSIS — M47812 Spondylosis without myelopathy or radiculopathy, cervical region: Secondary | ICD-10-CM

## 2019-10-06 DIAGNOSIS — G8929 Other chronic pain: Secondary | ICD-10-CM

## 2019-10-06 DIAGNOSIS — M7061 Trochanteric bursitis, right hip: Secondary | ICD-10-CM

## 2019-10-06 DIAGNOSIS — G894 Chronic pain syndrome: Secondary | ICD-10-CM

## 2019-10-06 DIAGNOSIS — M4716 Other spondylosis with myelopathy, lumbar region: Secondary | ICD-10-CM

## 2019-10-06 MED ORDER — FENTANYL 25 MCG/HR TD PT72: 1.0000 | MEDICATED_PATCH | TRANSDERMAL | 0 refills | Status: DC

## 2019-10-06 MED ORDER — HYDROCODONE-ACETAMINOPHEN 5-325 MG PO TABS: 1.0000 | ORAL_TABLET | Freq: Three times a day (TID) | ORAL | 0 refills | Status: DC | PRN

## 2019-10-06 MED ORDER — CYCLOBENZAPRINE HCL 10 MG PO TABS: 10.0000 mg | ORAL_TABLET | Freq: Three times a day (TID) | ORAL | 4 refills | Status: DC

## 2019-10-06 NOTE — Progress Notes (Signed)
Subjective:    Patient ID: Patty Salas, female    DOB: Jun 07, 1956, 64 y.o.   MRN: [id number]  HPI: Patty Salas is a 64 y.o. female whose appointment was changed to a virtual office visit to reduce the risk of exposure to the COVID-19 virus and to help Patty Salas remain healthy and safe. The virtual visit will also provide continuity of care. Patty Salas agrees with tele-health visit and verbalizes understanding.  She states her pain is located in her neck radiating into her bilateral shoulders, upper- lower back pain and bilateral hip pain. She rates her pain 9. Her  current exercise regime is walking and performing stretching exercises.  Patty Salas reports on 09/03/2019 she was coming out of the shower and her foot became tangled into the bathroom rug, she states she fell backward against the wall and hit her head. She didn't seek medical attention. She was instructed to call her PCP to schedule follow up appointment she verbalizes understanding. Educated on falls prevention and to remove all throw rugs, she states all rugs have been removed.   Patty Salas Morphine equivalent is 80.59 MME. she is also prescribed Lorazepam  by Dr. Reece Levy. We have discussed the black box warning of using opioids and benzodiazepines. I highlighted the dangers of using these drugs together and discussed the adverse events including respiratory suppression, overdose, cognitive impairment and importance of compliance with current regimen. We will continue to monitor and adjust as indicated.   She  is being closely monitored and under the care of her psychiatrist Dr. Reece Levy.   Last UDS was Performed on 08/05/2019, it was consistent.   Patty Salas asked the Health and History Questions. This provider and Patty Salas verified we were speaking with the correct person using two identifiers.   Pain Inventory Average Pain 8 Pain Right Now 9 My pain is sharp, burning, stabbing, tingling and aching  In the last 24  hours, has pain interfered with the following? General activity 10 Relation with others 10 Enjoyment of life 10 What TIME of day is your pain at its worst? all Sleep (in general) Fair  Pain is worse with: walking, bending, sitting, inactivity, standing and some activites Pain improves with: rest, heat/ice, therapy/exercise, pacing activities, medication and injections Relief from Meds: 5  Mobility walk with assistance use a cane use a walker how many minutes can you walk? 5-10 ability to climb steps?  yes do you drive?  yes  Function retired I need assistance with the following:  meal prep, household duties and shopping  Neuro/Psych bladder control problems bowel control problems weakness numbness tremor tingling trouble walking spasms dizziness confusion depression anxiety  Prior Studies Any changes since last visit?  no  Physicians involved in your care Any changes since last visit?  no   Family History  Problem Relation Age of Onset  . Cancer Mother        stomach  . Migraines Mother   . Arthritis Mother   . Emphysema Mother   . Heart disease Father   . Hypertension Father   . Cancer Father        colon  . Dementia Father   . Hypertension Brother   . Migraines Brother   . Hypertension Brother   . Migraines Brother   . Alcohol abuse Brother   . Bipolar disorder Brother   . Migraines Daughter   . Arthritis Maternal Grandmother   . Cancer Maternal Grandmother  stomach  . Diabetes Maternal Grandmother   . Stroke Maternal Grandmother   . Cancer Maternal Aunt        lung  . Emphysema Maternal Aunt   . Cancer Paternal Uncle        colon  . Hypertension Maternal Aunt   . Heart disease Maternal Aunt   . Cancer Paternal Uncle        lung   Social History   Socioeconomic History  . Marital status: Married    Spouse name: Not on file  . Number of children: 1  . Years of education: COLLEGE1  . Highest education level: Not on file    Occupational History  . Occupation: HOUSEWIFE    Employer: UNEMPLOYED  Tobacco Use  . Smoking status: Never Smoker  . Smokeless tobacco: Never Used  Substance and Sexual Activity  . Alcohol use: No  . Drug use: No  . Sexual activity: Not on file  Other Topics Concern  . Not on file  Social History Narrative   Patient is right handed.   Patient drinks caffeine occasionally.   Social Determinants of Health   Financial Resource Strain:   . Difficulty of Paying Living Expenses: Not on file  Food Insecurity:   . Worried About Patty Salas in the Last Year: Not on file  . Ran Out of Food in the Last Year: Not on file  Transportation Needs:   . Lack of Transportation (Medical): Not on file  . Lack of Transportation (Non-Medical): Not on file  Physical Activity:   . Days of Exercise per Week: Not on file  . Minutes of Exercise per Session: Not on file  Stress:   . Feeling of Stress : Not on file  Social Connections:   . Frequency of Communication with Friends and Family: Not on file  . Frequency of Social Gatherings with Friends and Family: Not on file  . Attends Religious Services: Not on file  . Active Member of Clubs or Organizations: Not on file  . Attends Archivist Meetings: Not on file  . Marital Status: Not on file   Past Surgical History:  Procedure Laterality Date  .  kidney stones    . ABDOMINAL ADHESION SURGERY    . ABDOMINAL HYSTERECTOMY  1995   partial- hemmoraged after surgery-stitch came loose  . APPENDECTOMY  1993   hemmoraged after surgery- stitch came loose  . CERVICAL DISC SURGERY  06/04/2001   with fusion  . CHOLECYSTECTOMY  1985  . colonoscopy    . COLONOSCOPY    . CYSTOSCOPY    . FOOT SURGERY     lt  . jj stent  07/2002   with ureteroscopy, cystoscopy and then removal of stent in office  . LITHOTRIPSY    . LYMPH NODE BIOPSY Right 03/06/2014   Procedure: right groin LYMPH NODE BIOPSY;  Surgeon: Merrie Roof, MD;  Location:  [facility];  Service: General;  Laterality: Right;  . LYMPHADENECTOMY N/A 12/29/2015   Procedure: RETROPERITONEAL LYMPHADENECTOMY;  Surgeon: Alexis Frock, MD;  Location: [facility];  Service: Urology;  Laterality: N/A;  . NECK SURGERY  2002  . ROBOT ASSISTED LAPAROSCOPIC NEPHRECTOMY Left 12/29/2015   Procedure: XI ROBOTIC ASSISTED LEFT LAPAROSCOPIC NEPHRECTOMY;  Surgeon: Alexis Frock, MD;  Location: [facility];  Service: Urology;  Laterality: Left;  . TONSILLECTOMY  1976   Past Medical History:  Diagnosis Date  . Anxiety   . Arthritis   . Asthma   .  Bipolar 2 disorder ([facility])    pt stated, "I have Bipolar 2 and it is remission"  . Bipolar affective ([place])   . Calcifying tendinitis of shoulder   . Carpal tunnel syndrome   . Cervical facet syndrome   . Chronic pain syndrome   . Complication of anesthesia   . Depression   . Diverticulosis   . Falls   . Fibromyalgia   . 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 Full dentures   . Gout   . Headache(784.0)    migraines  . Herpesviral infection   . Hypertension   . IBS (irritable bowel syndrome)    with diarrhea  . Lymphadenopathy   . Myalgia and myositis, unspecified   . PONV (postoperative nausea and vomiting)   . PTSD (post-traumatic stress disorder)   . Renal calculi   . Restless legs syndrome (RLS)   . Thoracic radiculopathy   . Vitamin D deficiency   . Wears glasses    BP (!) 142/76 Comment: pt reported  Pulse (!) 101 Comment: pt reported  Temp 98.7 F (37.1 C) Comment: pt reported  Ht 5\' 5"  (1.651 m) Comment: pt reported  Wt 150 lb 6.4 oz (68.2 kg) Comment: pt reported  BMI 25.03 kg/m   Opioid Risk Score:   Fall Risk Score:  `1  Depression screen PHQ 2/9  Depression screen [facility] 2/9 10/06/2019 04/30/2019 03/06/2019 01/07/2019 11/21/2018 11/12/2018 11/05/2018  Decreased Interest 1 1 1 1 1 1 1   Down, Depressed, Hopeless 1 1 1 1 1 1  0  PHQ - 2 Score 2 2 2 2 2 2 1   Altered sleeping - -  - - 1 1 0  Tired, decreased energy - - - - 2 1 2   Change in appetite - - - - 1 1 1   Feeling bad or failure about yourself  - - - - 0 0 0  Trouble concentrating - - - - 1 1 0  Moving slowly or fidgety/restless - - - - 1 1 1   Suicidal thoughts - - - - 0 0 0  PHQ-9 Score - - - - 8 7 5   Difficult doing work/chores - - - - Somewhat difficult Somewhat difficult Somewhat difficult  Some recent data might be hidden    Review of Systems  Constitutional: Positive for diaphoresis.  HENT: Negative.   Eyes: Negative.   Respiratory: Positive for cough.   Cardiovascular: Positive for leg swelling.  Gastrointestinal: Positive for constipation, diarrhea, nausea and vomiting.  Endocrine: Negative.   Genitourinary: Positive for dysuria.  Musculoskeletal: Positive for myalgias.       Spasms  Skin: Negative.   Allergic/Immunologic: Negative.   Neurological: Positive for dizziness, tremors, weakness, numbness and headaches.  Hematological: Negative.   Psychiatric/Behavioral: Positive for dysphoric mood. The patient is nervous/anxious.   All other systems reviewed and are negative.      Objective:   Physical Exam Vitals and nursing note reviewed.  Musculoskeletal:     Comments: No Physical Exam: Virtual Visit           Assessment & Plan:  1. History of fibromyalgia with myofascial pain and multiple trigger points.Continue with Heat and exercise Regime. Continue withcurrent medication regimen withgabapentin. 10/06/2019 2. Chronic migraine headaches.Continue to Monitor. S/P Botox.on 05/30/2017 10/06/2019 3. Midline Low Back Pain/ Lumbar Spondylosis/Lumbar degenerative disk disease, L4-5/ Lumbar Radiculitis::Continue with HEP and current medication regimen. Refilled: Fentanyl Patch 25 mcg one patch every three days #10 and ContinueHydrocodone  5/325 mg one tablet every 6 hours as needed #90. 10/06/2019. 4. 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 Urology  Following. 10/06/2019. 5. 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 10/06/2019 6. Cervicalgia/ Cervical RadiculitisCervical Spondylosis: Continuecurrent medication regiment withGabapentin. Continue with HEP and Continue to monitor. 10/06/2019 7. Muscle Spasm: Continuecurrent medication regiment withFlexeril as needed. 10/06/2019 9. Bilateral Greater Trochanter Bursitis: Continue to alternate with ice and heat therapy. Continue HEP as Tolerated. Continue to monitor.10/06/2019  F/U in 1 month  Tele-Health Visit Telephone Call Established Patient Location of Patient: In Her Home Location of Provider: In the Office

## 2019-10-07 MED ORDER — HYDROCODONE-ACETAMINOPHEN 5-325 MG PO TABS: 1.0000 | ORAL_TABLET | Freq: Three times a day (TID) | ORAL | 0 refills | Status: DC | PRN

## 2019-10-07 MED ORDER — FENTANYL 25 MCG/HR TD PT72: 1.0000 | MEDICATED_PATCH | TRANSDERMAL | 0 refills | Status: DC

## 2019-10-08 MED ORDER — HYDROCODONE-ACETAMINOPHEN 5-325 MG PO TABS: 1.0000 | ORAL_TABLET | Freq: Three times a day (TID) | ORAL | 0 refills | Status: DC | PRN

## 2019-10-08 MED ORDER — FENTANYL 25 MCG/HR TD PT72: 1.0000 | MEDICATED_PATCH | TRANSDERMAL | 0 refills | Status: DC

## 2019-10-28 ENCOUNTER — Other Ambulatory Visit: Payer: Self-pay | Admitting: Neurology

## 2019-11-04 ENCOUNTER — Telehealth: Payer: Self-pay

## 2019-11-04 MED ORDER — ALMOTRIPTAN MALATE 12.5 MG PO TABS: ORAL_TABLET | ORAL | 0 refills | Status: DC

## 2019-11-04 NOTE — Telephone Encounter (Signed)
Patient called stating she would like to schedule her aimovig injection and would like to schedule it before the end of the month 2/28. Please follow up.  Patient states she will be out of her almotriptan (AXERT) 12.5 MG tablet medication on feb 10th.

## 2019-11-05 ENCOUNTER — Other Ambulatory Visit: Payer: Self-pay

## 2019-11-05 ENCOUNTER — Encounter: Payer: 59 | Attending: Physical Medicine & Rehabilitation | Admitting: Physical Medicine & Rehabilitation

## 2019-11-05 ENCOUNTER — Telehealth: Payer: Self-pay

## 2019-11-05 ENCOUNTER — Encounter: Payer: Self-pay | Admitting: Physical Medicine & Rehabilitation

## 2019-11-05 DIAGNOSIS — M7918 Myalgia, other site: Secondary | ICD-10-CM | POA: Diagnosis present

## 2019-11-05 DIAGNOSIS — M797 Fibromyalgia: Secondary | ICD-10-CM | POA: Diagnosis present

## 2019-11-05 DIAGNOSIS — Z5181 Encounter for therapeutic drug level monitoring: Secondary | ICD-10-CM | POA: Insufficient documentation

## 2019-11-05 DIAGNOSIS — G894 Chronic pain syndrome: Secondary | ICD-10-CM | POA: Insufficient documentation

## 2019-11-05 DIAGNOSIS — M546 Pain in thoracic spine: Secondary | ICD-10-CM | POA: Insufficient documentation

## 2019-11-05 DIAGNOSIS — M542 Cervicalgia: Secondary | ICD-10-CM | POA: Insufficient documentation

## 2019-11-05 DIAGNOSIS — M7061 Trochanteric bursitis, right hip: Secondary | ICD-10-CM | POA: Insufficient documentation

## 2019-11-05 DIAGNOSIS — G8929 Other chronic pain: Secondary | ICD-10-CM | POA: Diagnosis present

## 2019-11-05 DIAGNOSIS — M47812 Spondylosis without myelopathy or radiculopathy, cervical region: Secondary | ICD-10-CM

## 2019-11-05 DIAGNOSIS — G43519 Persistent migraine aura without cerebral infarction, intractable, without status migrainosus: Secondary | ICD-10-CM | POA: Insufficient documentation

## 2019-11-05 DIAGNOSIS — M4716 Other spondylosis with myelopathy, lumbar region: Secondary | ICD-10-CM

## 2019-11-05 DIAGNOSIS — Z79891 Long term (current) use of opiate analgesic: Secondary | ICD-10-CM | POA: Diagnosis present

## 2019-11-05 DIAGNOSIS — M5412 Radiculopathy, cervical region: Secondary | ICD-10-CM | POA: Insufficient documentation

## 2019-11-05 DIAGNOSIS — M7062 Trochanteric bursitis, left hip: Secondary | ICD-10-CM | POA: Insufficient documentation

## 2019-11-05 MED ORDER — HYDROCODONE-ACETAMINOPHEN 5-325 MG PO TABS: 1.0000 | ORAL_TABLET | Freq: Three times a day (TID) | ORAL | 0 refills | Status: DC | PRN

## 2019-11-05 MED ORDER — FENTANYL 25 MCG/HR TD PT72: 1.0000 | MEDICATED_PATCH | TRANSDERMAL | 0 refills | Status: DC

## 2019-11-05 NOTE — Telephone Encounter (Signed)
Meds printed

## 2019-11-05 NOTE — Telephone Encounter (Signed)
Noted  

## 2019-11-05 NOTE — Progress Notes (Signed)
Procedure visit: Trigger point injections  After informed consent and preparation of the skin with isopropyl alcohol, I injected the T11 (2) and L4 (2) paraspinals each with 2cc of 1% lidocaine. The patient tolerated well, and no complications were experienced. Post-injection instructions were provided.   Medical Problem List: 1..  Fibromyalgia with diffuse myofascial pain in multiple trigger points.  -Continue with gabapentin  -Fentanyl patch 25 mcg every 72 hours #10 RF today  -Hydrocodone 5/325 1 every 6 hours as needed #90. RF today  We will continue the controlled substance monitoring program, this consists of regular clinic visits, examinations, routine drug screening, pill counts as well as use of [place] Controlled Substance Reporting System. [place] today.    -Medication was refilled and a second prescription was sent to the patient's pharmacy for next month.    -I think there is an opportunity with her next visit to decrease fentanyl to 67mc/hr. She will d/w [name] at that visit. 2.  Chronic low back pain/lumbar spondylosis with degenerative disc disease and lumbar radiculitis 3.  History of left renal mass 4.  Right carpal tunnel syndrome  -Continue wrist splint at least at nighttime and as needed during the day 5.  Cervical spondylosis with radiculitis  -Continue home exercise program and scheduled gabapentin  -As needed Flexeril 6.  Bilateral greater trochanter bursitis  -ICE, HEP   F/u with Mrs. Thomas in 2 mos.

## 2019-11-05 NOTE — Telephone Encounter (Signed)
I called pt back and had a lengthy conversation. Pt scheduled her injection for 1:30 pm on Monday 11/17/2019. She will call ahead of time if she runs a fever again (currently not but has been) or if she is having concerns. Pt reported updates about her attempts to wean medications today, just saw pain management. The pt will bring her epi pen and will get the Aimovig from the pharmacy. She wants to try the medication. She verbalized appreciation for the call.

## 2019-11-05 NOTE — Patient Instructions (Signed)
COVID-19 Vaccine Information can be found at: [email or web address] For questions related to vaccine distribution or appointments, please email [email or web address] or call [phone].    

## 2019-11-07 ENCOUNTER — Other Ambulatory Visit: Payer: Self-pay | Admitting: Family Medicine

## 2019-11-10 ENCOUNTER — Other Ambulatory Visit: Payer: Self-pay | Admitting: Neurology

## 2019-11-17 ENCOUNTER — Telehealth: Payer: Self-pay

## 2019-11-17 ENCOUNTER — Ambulatory Visit: Payer: Self-pay

## 2019-11-17 ENCOUNTER — Telehealth: Payer: Self-pay | Admitting: Neurology

## 2019-11-17 MED ORDER — AJOVY 225 MG/1.5ML ~~LOC~~ SOAJ: 225.0000 mg | SUBCUTANEOUS | 4 refills | Status: DC

## 2019-11-17 NOTE — Telephone Encounter (Signed)
Patient called stating her IBS is really bad today and has been going on since yesterday and will not be able to attend her apt. Patient would like to know if it would be okay to miss todays apt and will also be checking with other companies about getting coverage for her aimovig;

## 2019-11-17 NOTE — Telephone Encounter (Signed)
PA done for Ajovy on cover my meds. PT cannot take aimovig because of latex allergy listed. Pt has 58 allergies. PT has tried and failed tylenol, axert, zomig, gabapentin, flexeril, noco. Her allergy listed includes cymbalta-anaphylaxis, depakote-anaphylaxis,imitrex-anaphylaxis, zyprexa-anaphylaxis, topamax-anaphylaxis,lyrica, seroquel-bad dreams,sedating.    Your information has been submitted to [place]. To check for an updated outcome later, reopen this PA request from your dashboard.  If [employer] has not responded to your request within 24 hours, contact [facility] at [phone]. If you think there may be a problem with your PA request, use our live chat feature at the bottom right.

## 2019-11-17 NOTE — Telephone Encounter (Signed)
The patient has a latex allergy, Aimovig has latex in the, we will switch to Ajovy which has no latex.  The patient has 58 allergies listed.

## 2019-11-17 NOTE — Telephone Encounter (Signed)
I called pt that Dr. Jannifer Franklin is changing her to Ajovy injection for headaches. He does not want her to take the aimovig because it has latex in it and she has that allergy. I stated per Dr. Jannifer Franklin she does not have to be monitor for Ajovy at our office. I stated it was sent to her listed pharmacy and will require a PA. I stated she will take the Ajovy once a month if approve. Pt verbalized understanding.  Pt wanted Dr. Eugenie Birks to know she had trigger points and is trying to decrease her norco medications prescribed by Dr. Mamie Laurel. I told pt PA will take 3 to 4 days for a decision on AJOVy. PT verbalized understanding.

## 2019-11-19 ENCOUNTER — Telehealth: Payer: Self-pay | Admitting: Family Medicine

## 2019-11-19 NOTE — Telephone Encounter (Signed)
Thank you for explained to patient she needs to seek immediate care at the urgent care especially since this caused her to end up in the hospital last time.  Since I have never treated her for this condition and never evaluated on her this is something that definitely needs to be evaluated and seen especially since, I have more concerns this is not a herpetic outbreak but rather allergic reaction.  Thank you for letting her know.

## 2019-11-19 NOTE — Telephone Encounter (Signed)
Receive fax from [facility]. Ajovy 225mg /ml/1.5 [place] SOAJ was approve from 11/18/2019 to 03/16/2020.

## 2019-11-19 NOTE — Telephone Encounter (Signed)
Patient is aware of the below but states that this is not an allergic reaction. That this is a herpes flare up and that it is now affecting her IBS and she has an upset stomach. She states she has ended up in the hospital with this before and is very concerned. I advised patient that we do not have any apts in our office until March at this point and that with the symptoms she is having that she needs to be evaluated. I advised patient to seek care at Urgent care. Patient verbalized understanding and said she would do that when the weather improved. AS, CMA

## 2019-11-19 NOTE — Telephone Encounter (Signed)
Patient has not been seen in our office since 11/21/2018 and the requested medication has never been prescribed by you. Please advise. AS, CMA

## 2019-11-19 NOTE — Telephone Encounter (Signed)
Patient called states has mild allergic reaction marinara sauce & garlic--Per patient sores on outside of mouth around the edges which is what normally happens when she comes into contact.Pt request refill on:  acyclovir (ZOVIRAX) 800 MG tablet [id number]   Order Details Dose: 800 mg Route: Oral Frequency: As needed  Dispense Quantity: -- Refills: --       Sig: Take 800 mg by mouth as needed.    Send refill to :  [id number] [address], [place]. [phone] (Phone) [phone] (Fax   --glh

## 2019-11-19 NOTE — Telephone Encounter (Signed)
Please let patient know that acyclovir will not help with an allergic reaction to a substance that causes sores on the outside of her mouth.  This would not be caused by a virus but rather by coming in contact with something she is allergic to.   I recommend she use over-the-counter steroid cream on it and avoid offending agents. Please let patient know she will need to make a follow-up office visit with me for ongoing chronic care since she has not been seen in so long.

## 2019-12-03 ENCOUNTER — Telehealth: Payer: Self-pay | Admitting: *Deleted

## 2019-12-03 DIAGNOSIS — M47812 Spondylosis without myelopathy or radiculopathy, cervical region: Secondary | ICD-10-CM

## 2019-12-03 DIAGNOSIS — G894 Chronic pain syndrome: Secondary | ICD-10-CM

## 2019-12-03 DIAGNOSIS — M4716 Other spondylosis with myelopathy, lumbar region: Secondary | ICD-10-CM

## 2019-12-03 DIAGNOSIS — M797 Fibromyalgia: Secondary | ICD-10-CM

## 2019-12-03 MED ORDER — FENTANYL 25 MCG/HR TD PT72: 1.0000 | MEDICATED_PATCH | TRANSDERMAL | 0 refills | Status: DC

## 2019-12-03 MED ORDER — HYDROCODONE-ACETAMINOPHEN 5-325 MG PO TABS: 1.0000 | ORAL_TABLET | Freq: Three times a day (TID) | ORAL | 0 refills | Status: DC | PRN

## 2019-12-03 NOTE — Telephone Encounter (Signed)
Patient left a message basically asking for refill on her fentanyl and norco. She states that she needs to take new insurance information up to her pharmacy.    I contacted the patient. I have her new Part D information.  [employer]  Member ID# [id number]  RxBIN#  [id number] RxGRP# [id number] RxPCN# [id number]  I am going to submit prior authorizations in advance

## 2019-12-03 NOTE — Telephone Encounter (Signed)
Prescription Printed: Unable to E-scribed

## 2019-12-03 NOTE — Telephone Encounter (Signed)
PMP was Reviewed.  

## 2019-12-03 NOTE — Telephone Encounter (Signed)
PMP was Reviewed: Fentanyl and Hydrocodone e-scribed. Patty Salas is aware of the above.

## 2019-12-04 ENCOUNTER — Telehealth: Payer: Self-pay | Admitting: Registered Nurse

## 2019-12-04 ENCOUNTER — Telehealth: Payer: Self-pay | Admitting: *Deleted

## 2019-12-04 ENCOUNTER — Other Ambulatory Visit: Payer: Self-pay | Admitting: *Deleted

## 2019-12-04 DIAGNOSIS — M797 Fibromyalgia: Secondary | ICD-10-CM

## 2019-12-04 DIAGNOSIS — M4716 Other spondylosis with myelopathy, lumbar region: Secondary | ICD-10-CM

## 2019-12-04 DIAGNOSIS — M47812 Spondylosis without myelopathy or radiculopathy, cervical region: Secondary | ICD-10-CM

## 2019-12-04 DIAGNOSIS — G894 Chronic pain syndrome: Secondary | ICD-10-CM

## 2019-12-04 MED ORDER — FENTANYL 25 MCG/HR TD PT72: 1.0000 | MEDICATED_PATCH | TRANSDERMAL | 0 refills | Status: DC

## 2019-12-04 MED ORDER — HYDROCODONE-ACETAMINOPHEN 5-325 MG PO TABS: 1.0000 | ORAL_TABLET | Freq: Three times a day (TID) | ORAL | 0 refills | Status: DC | PRN

## 2019-12-04 NOTE — Telephone Encounter (Signed)
Unable to e-scribe Patty Salas medication. IT is aware. Patty Salas is scheduled to pick up a printed prescription.

## 2019-12-04 NOTE — Telephone Encounter (Signed)
Prior authorization submitted via covermymeds for fentanyl and Norco.  Approved

## 2020-01-01 ENCOUNTER — Encounter: Payer: Medicare Other | Admitting: Registered Nurse

## 2020-01-07 ENCOUNTER — Encounter: Payer: Medicare Other | Attending: Physical Medicine & Rehabilitation | Admitting: Registered Nurse

## 2020-01-07 ENCOUNTER — Encounter: Payer: Self-pay | Admitting: *Deleted

## 2020-01-07 ENCOUNTER — Other Ambulatory Visit: Payer: Self-pay

## 2020-01-07 VITALS — BP 129/74 | HR 100 | Ht 65.0 in | Wt 151.8 lb

## 2020-01-07 DIAGNOSIS — M7918 Myalgia, other site: Secondary | ICD-10-CM

## 2020-01-07 DIAGNOSIS — Z5181 Encounter for therapeutic drug level monitoring: Secondary | ICD-10-CM

## 2020-01-07 DIAGNOSIS — Z79891 Long term (current) use of opiate analgesic: Secondary | ICD-10-CM

## 2020-01-07 DIAGNOSIS — G8929 Other chronic pain: Secondary | ICD-10-CM

## 2020-01-07 DIAGNOSIS — G43519 Persistent migraine aura without cerebral infarction, intractable, without status migrainosus: Secondary | ICD-10-CM | POA: Insufficient documentation

## 2020-01-07 DIAGNOSIS — M797 Fibromyalgia: Secondary | ICD-10-CM

## 2020-01-07 DIAGNOSIS — G894 Chronic pain syndrome: Secondary | ICD-10-CM

## 2020-01-07 DIAGNOSIS — M5412 Radiculopathy, cervical region: Secondary | ICD-10-CM

## 2020-01-07 DIAGNOSIS — M4716 Other spondylosis with myelopathy, lumbar region: Secondary | ICD-10-CM

## 2020-01-07 DIAGNOSIS — M7061 Trochanteric bursitis, right hip: Secondary | ICD-10-CM | POA: Insufficient documentation

## 2020-01-07 DIAGNOSIS — M546 Pain in thoracic spine: Secondary | ICD-10-CM | POA: Insufficient documentation

## 2020-01-07 DIAGNOSIS — M7062 Trochanteric bursitis, left hip: Secondary | ICD-10-CM | POA: Insufficient documentation

## 2020-01-07 DIAGNOSIS — M47812 Spondylosis without myelopathy or radiculopathy, cervical region: Secondary | ICD-10-CM

## 2020-01-07 DIAGNOSIS — M542 Cervicalgia: Secondary | ICD-10-CM

## 2020-01-07 MED ORDER — FENTANYL 25 MCG/HR TD PT72: 1.0000 | MEDICATED_PATCH | TRANSDERMAL | 0 refills | Status: DC

## 2020-01-07 MED ORDER — HYDROCODONE-ACETAMINOPHEN 5-325 MG PO TABS: 1.0000 | ORAL_TABLET | Freq: Three times a day (TID) | ORAL | 0 refills | Status: DC | PRN

## 2020-01-07 NOTE — Progress Notes (Signed)
Subjective:    Patient ID: Patty Salas, female    DOB: 03/28/1956, 63 y.o.   MRN: [id number]  HPI: LADONIA Salas is a 64 y.o. female whose appointment was changed to a virtual visit she reports she is experiencing nausea, vomiting, fever and diarrhea. Her appointment was changed to a virtual visit she agrees with virtual visit.  She states her pain is located in her neck, mid- lower back radiating into her bilateral lower extremities. Also reports generalized joint pain all over, She rates her pain 10. Her  current exercise regime is walking and performing stretching exercises.  Ms. Yong Morphine equivalent is 74.58 MME.  Last UDS was Performed on 08/11/2019, it was consistent.   Pain Inventory Average Pain 8 Pain Right Now 10 My pain is burning, stabbing, tingling and aching  In the last 24 hours, has pain interfered with the following? General activity 10 Relation with others 10 Enjoyment of life 10 What TIME of day is your pain at its worst? all Sleep (in general) Fair  Pain is worse with: walking, bending, sitting, inactivity, standing and some activites Pain improves with: rest, heat/ice, therapy/exercise, pacing activities, medication and injections Relief from Meds: 5  Mobility walk with assistance use a cane use a walker how many minutes can you walk? 5-10 ability to climb steps?  yes do you drive?  yes  Function retired I need assistance with the following:  meal prep, household duties and shopping  Neuro/Psych bladder control problems bowel control problems weakness numbness tremor tingling trouble walking spasms dizziness confusion depression anxiety  Prior Studies Any changes since last visit?  no  Physicians involved in your care Any changes since last visit?  no   Family History  Problem Relation Age of Onset  . Cancer Mother        stomach  . Migraines Mother   . Arthritis Mother   . Emphysema Mother   . Heart disease Father   .  Hypertension Father   . Cancer Father        colon  . Dementia Father   . Hypertension Brother   . Migraines Brother   . Hypertension Brother   . Migraines Brother   . Alcohol abuse Brother   . Bipolar disorder Brother   . Migraines Daughter   . Arthritis Maternal Grandmother   . Cancer Maternal Grandmother        stomach  . Diabetes Maternal Grandmother   . Stroke Maternal Grandmother   . Cancer Maternal Aunt        lung  . Emphysema Maternal Aunt   . Cancer Paternal Uncle        colon  . Hypertension Maternal Aunt   . Heart disease Maternal Aunt   . Cancer Paternal Uncle        lung   Social History   Socioeconomic History  . Marital status: Married    Spouse name: Not on file  . Number of children: 1  . Years of education: COLLEGE1  . Highest education level: Not on file  Occupational History  . Occupation: HOUSEWIFE    Employer: UNEMPLOYED  Tobacco Use  . Smoking status: Never Smoker  . Smokeless tobacco: Never Used  Substance and Sexual Activity  . Alcohol use: No  . Drug use: No  . Sexual activity: Not on file  Other Topics Concern  . Not on file  Social History Narrative   Patient is right handed.   Patient drinks  caffeine occasionally.   Social Determinants of Health   Financial Resource Strain:   . Difficulty of Paying Living Expenses:   Food Insecurity:   . Worried About Charity fundraiser in the Last Year:   . Arboriculturist in the Last Year:   Transportation Needs:   . Film/video editor (Medical):   Marland Kitchen Lack of Transportation (Non-Medical):   Physical Activity:   . Days of Exercise per Week:   . Minutes of Exercise per Session:   Stress:   . Feeling of Stress :   Social Connections:   . Frequency of Communication with Friends and Family:   . Frequency of Social Gatherings with Friends and Family:   . Attends Religious Services:   . Active Member of Clubs or Organizations:   . Attends Archivist Meetings:   Marland Kitchen Marital  Status:    Past Surgical History:  Procedure Laterality Date  .  kidney stones    . ABDOMINAL ADHESION SURGERY    . ABDOMINAL HYSTERECTOMY  1995   partial- hemmoraged after surgery-stitch came loose  . APPENDECTOMY  1993   hemmoraged after surgery- stitch came loose  . CERVICAL DISC SURGERY  06/04/2001   with fusion  . CHOLECYSTECTOMY  1985  . colonoscopy    . COLONOSCOPY    . CYSTOSCOPY    . FOOT SURGERY     lt  . jj stent  07/2002   with ureteroscopy, cystoscopy and then removal of stent in office  . LITHOTRIPSY    . LYMPH NODE BIOPSY Right 03/06/2014   Procedure: right groin LYMPH NODE BIOPSY;  Surgeon: Merrie Roof, MD;  Location: [facility];  Service: General;  Laterality: Right;  . LYMPHADENECTOMY N/A 12/29/2015   Procedure: RETROPERITONEAL LYMPHADENECTOMY;  Surgeon: Alexis Frock, MD;  Location: [facility];  Service: Urology;  Laterality: N/A;  . NECK SURGERY  2002  . ROBOT ASSISTED LAPAROSCOPIC NEPHRECTOMY Left 12/29/2015   Procedure: XI ROBOTIC ASSISTED LEFT LAPAROSCOPIC NEPHRECTOMY;  Surgeon: Alexis Frock, MD;  Location: [facility];  Service: Urology;  Laterality: Left;  . TONSILLECTOMY  1976   Past Medical History:  Diagnosis Date  . Anxiety   . Arthritis   . Asthma   . Bipolar 2 disorder ([place])    pt stated, "I have Bipolar 2 and it is remission"  . Bipolar affective ([place])   . Calcifying tendinitis of shoulder   . Carpal tunnel syndrome   . Cervical facet syndrome   . Chronic pain syndrome   . Complication of anesthesia   . Depression   . Diverticulosis   . Falls   . Fibromyalgia   . 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 Full dentures   . Gout   . Headache(784.0)    migraines  . Herpesviral infection   . Hypertension   . IBS (irritable bowel syndrome)    with diarrhea  . Lymphadenopathy   . Myalgia and myositis, unspecified   . PONV (postoperative nausea and vomiting)   . PTSD (post-traumatic stress  disorder)   . Renal calculi   . Restless legs syndrome (RLS)   . Thoracic radiculopathy   . Vitamin D deficiency   . Wears glasses    BP 129/74   Pulse 100   Ht 5\' 5"  (1.651 m)   Wt 151 lb 12.8 oz (68.9 kg)   BMI 25.26 kg/m   Opioid Risk Score:   Fall Risk  Score:  `1  Depression screen PHQ 2/9  Depression screen [facility] 2/9 10/06/2019 04/30/2019 03/06/2019 01/07/2019 11/21/2018 11/12/2018 11/05/2018  Decreased Interest 1 1 1 1 1 1 1   Down, Depressed, Hopeless 1 1 1 1 1 1  0  PHQ - 2 Score 2 2 2 2 2 2 1   Altered sleeping - - - - 1 1 0  Tired, decreased energy - - - - 2 1 2   Change in appetite - - - - 1 1 1   Feeling bad or failure about yourself  - - - - 0 0 0  Trouble concentrating - - - - 1 1 0  Moving slowly or fidgety/restless - - - - 1 1 1   Suicidal thoughts - - - - 0 0 0  PHQ-9 Score - - - - 8 7 5   Difficult doing work/chores - - - - Somewhat difficult Somewhat difficult Somewhat difficult  Some recent data might be hidden    Review of Systems  Musculoskeletal: Positive for gait problem.  Neurological: Positive for dizziness, tremors, weakness and numbness.  Psychiatric/Behavioral: Positive for dysphoric mood. The patient is nervous/anxious.   All other systems reviewed and are negative.      Objective:   Physical Exam Vitals and nursing note reviewed.  Musculoskeletal:     Comments: No Physical Exam: Virtual Visit           Assessment & Plan:  1. History of fibromyalgia with myofascial pain and multiple trigger points.Continue with Heat and exercise Regime. Continue withcurrent medication regimen withgabapentin.01/07/2020 2. Chronic migraine headaches.Continue to Monitor. S/P Botox.on 05/30/2017 01/07/2020 3. Midline Low Back Pain/ Lumbar Spondylosis/Lumbar degenerative disk disease, L4-5/ Lumbar Radiculitis::Continue with HEP and current medication regimen. Refilled: Fentanyl Patch 25 mcg one patch every three days #10 and ContinueHydrocodone 5/325 mg one  tablet every 6 hours as needed #90.01/07/2020. 4. 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 Urology Following.01/07/2020. 5. Right CTS: Continue to wear Wrist stabilizer.01/07/2020 6. Cervicalgia/ Cervical RadiculitisCervical Spondylosis: Continuecurrent medication regiment withGabapentin. Continue with HEP and Continue to monitor.01/07/2020 7. Muscle Spasm: Continuecurrent medication regiment withFlexeril as needed.01/07/2020 9. Bilateral Greater Trochanter Bursitis: No complaints today. Continue to alternate with ice and heat therapy. Continue HEP as Tolerated. Continue to monitor.01/07/2020  F/U in 1 month  Tele-Health Visit Telephone Call Established Patient Location of Patient: In Her Home Location of Provider: In the Office

## 2020-01-09 ENCOUNTER — Telehealth: Payer: Self-pay | Admitting: Registered Nurse

## 2020-01-09 DIAGNOSIS — M47812 Spondylosis without myelopathy or radiculopathy, cervical region: Secondary | ICD-10-CM

## 2020-01-09 DIAGNOSIS — G894 Chronic pain syndrome: Secondary | ICD-10-CM

## 2020-01-09 DIAGNOSIS — M4716 Other spondylosis with myelopathy, lumbar region: Secondary | ICD-10-CM

## 2020-01-09 DIAGNOSIS — M797 Fibromyalgia: Secondary | ICD-10-CM

## 2020-01-09 MED ORDER — FENTANYL 25 MCG/HR TD PT72: 1.0000 | MEDICATED_PATCH | TRANSDERMAL | 0 refills | Status: DC

## 2020-01-09 MED ORDER — HYDROCODONE-ACETAMINOPHEN 5-325 MG PO TABS: 1.0000 | ORAL_TABLET | Freq: Three times a day (TID) | ORAL | 0 refills | Status: DC | PRN

## 2020-01-09 NOTE — Telephone Encounter (Signed)
We are having difficulties with e- scribing Ms. [facility] prescriptions. Prescriptions printed today.

## 2020-01-19 ENCOUNTER — Telehealth: Payer: Self-pay | Admitting: Adult Health

## 2020-01-19 ENCOUNTER — Ambulatory Visit: Payer: 59 | Admitting: Adult Health

## 2020-01-19 NOTE — Telephone Encounter (Signed)
Pt called needing to speak to the RN about her appt today. Pt is having a reaction to the Covid Vaccine and she is needing to discuss this as well. Pt refused a VV and states she will only do a telephone visit if allowed. Please advise.

## 2020-01-19 NOTE — Telephone Encounter (Signed)
I called pt.  Cancelled her appt due to reaction to 2nd pfizer covid vaccine, 2 nights of diarrhea, has IBS. R/S for 02-10-20 at 1330.  She stated she had enough medication.

## 2020-02-04 ENCOUNTER — Other Ambulatory Visit: Payer: Self-pay

## 2020-02-04 ENCOUNTER — Encounter: Payer: Self-pay | Admitting: Registered Nurse

## 2020-02-04 ENCOUNTER — Encounter: Payer: Medicare Other | Attending: Physical Medicine & Rehabilitation | Admitting: Registered Nurse

## 2020-02-04 VITALS — BP 145/81 | HR 100 | Temp 97.2°F | Ht 65.0 in | Wt 151.6 lb

## 2020-02-04 DIAGNOSIS — M546 Pain in thoracic spine: Secondary | ICD-10-CM | POA: Insufficient documentation

## 2020-02-04 DIAGNOSIS — Z5181 Encounter for therapeutic drug level monitoring: Secondary | ICD-10-CM | POA: Diagnosis present

## 2020-02-04 DIAGNOSIS — M7062 Trochanteric bursitis, left hip: Secondary | ICD-10-CM | POA: Diagnosis present

## 2020-02-04 DIAGNOSIS — M7061 Trochanteric bursitis, right hip: Secondary | ICD-10-CM | POA: Diagnosis present

## 2020-02-04 DIAGNOSIS — M5412 Radiculopathy, cervical region: Secondary | ICD-10-CM | POA: Insufficient documentation

## 2020-02-04 DIAGNOSIS — M7918 Myalgia, other site: Secondary | ICD-10-CM | POA: Insufficient documentation

## 2020-02-04 DIAGNOSIS — M797 Fibromyalgia: Secondary | ICD-10-CM | POA: Diagnosis present

## 2020-02-04 DIAGNOSIS — M542 Cervicalgia: Secondary | ICD-10-CM | POA: Diagnosis present

## 2020-02-04 DIAGNOSIS — G8929 Other chronic pain: Secondary | ICD-10-CM | POA: Diagnosis present

## 2020-02-04 DIAGNOSIS — Z79891 Long term (current) use of opiate analgesic: Secondary | ICD-10-CM | POA: Insufficient documentation

## 2020-02-04 DIAGNOSIS — G43519 Persistent migraine aura without cerebral infarction, intractable, without status migrainosus: Secondary | ICD-10-CM | POA: Diagnosis present

## 2020-02-04 DIAGNOSIS — G894 Chronic pain syndrome: Secondary | ICD-10-CM | POA: Insufficient documentation

## 2020-02-04 DIAGNOSIS — M47812 Spondylosis without myelopathy or radiculopathy, cervical region: Secondary | ICD-10-CM | POA: Diagnosis present

## 2020-02-04 DIAGNOSIS — M4716 Other spondylosis with myelopathy, lumbar region: Secondary | ICD-10-CM | POA: Insufficient documentation

## 2020-02-04 MED ORDER — HYDROCODONE-ACETAMINOPHEN 5-325 MG PO TABS: 1.0000 | ORAL_TABLET | Freq: Three times a day (TID) | ORAL | 0 refills | Status: DC | PRN

## 2020-02-04 MED ORDER — FENTANYL 12 MCG/HR TD PT72: 1.0000 | MEDICATED_PATCH | TRANSDERMAL | 0 refills | Status: DC

## 2020-02-04 NOTE — Progress Notes (Signed)
Subjective:    Patient ID: Patty Salas, female    DOB: Nov 21, 1955, 64 y.o.   MRN: [id number]  HPI: Patty Salas is a 64 y.o. female who returns for follow up appointment for chronic pain and medication refill. She states her pain is located in her neck radiating into her bilateral shoulders, mid- lower back pain and bilateral hip pain. Her current exercise regime is walking and performing stretching exercises.  Ms. [place] Morphine equivalent is  74.58 MME.  UDS ordered today.   Pain Inventory Average Pain 9 Pain Right Now 9 My pain is constant, sharp, burning and stabbing  In the last 24 hours, has pain interfered with the following? General activity 10 Relation with others 10 Enjoyment of life 10 What TIME of day is your pain at its worst? all Sleep (in general) Fair  Pain is worse with: walking, bending, sitting, standing and some activites Pain improves with: rest, heat/ice, therapy/exercise, medication and injections Relief from Meds: 5  Mobility walk with assistance use a cane use a walker how many minutes can you walk? 5 ability to climb steps?  yes do you drive?  yes Do you have any goals in this area?  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 I need assistance with the following:  meal prep, household duties and shopping  Neuro/Psych bladder control problems bowel control problems weakness numbness tremor tingling trouble walking spasms dizziness confusion depression anxiety  Prior Studies Any changes since last visit?  no  Physicians involved in your care Any changes since last visit?  no   Family History  Problem Relation Age of Onset  . Cancer Mother        stomach  . Migraines Mother   . Arthritis Mother   . Emphysema Mother   . Heart disease Father   . Hypertension Father   . Cancer Father        colon  . Dementia Father   . Hypertension Brother   . Migraines Brother   .  Hypertension Brother   . Migraines Brother   . Alcohol abuse Brother   . Bipolar disorder Brother   . Migraines Daughter   . Arthritis Maternal Grandmother   . Cancer Maternal Grandmother        stomach  . Diabetes Maternal Grandmother   . Stroke Maternal Grandmother   . Cancer Maternal Aunt        lung  . Emphysema Maternal Aunt   . Cancer Paternal Uncle        colon  . Hypertension Maternal Aunt   . Heart disease Maternal Aunt   . Cancer Paternal Uncle        lung   Social History   Socioeconomic History  . Marital status: Married    Spouse name: Not on file  . Number of children: 1  . Years of education: COLLEGE1  . Highest education level: Not on file  Occupational History  . Occupation: HOUSEWIFE    Employer: UNEMPLOYED  Tobacco Use  . Smoking status: Never Smoker  . Smokeless tobacco: Never Used  Substance and Sexual Activity  . Alcohol use: No  . Drug use: No  . Sexual activity: Not on file  Other Topics Concern  . Not on file  Social History Narrative   Patient is right handed.   Patient drinks caffeine occasionally.   Social Determinants of Health   Financial Resource Strain:   . Difficulty  of Paying Living Expenses:   Food Insecurity:   . Worried About Charity fundraiser in the Last Year:   . Arboriculturist in the Last Year:   Transportation Needs:   . Film/video editor (Medical):   Marland Kitchen Lack of Transportation (Non-Medical):   Physical Activity:   . Days of Exercise per Week:   . Minutes of Exercise per Session:   Stress:   . Feeling of Stress :   Social Connections:   . Frequency of Communication with Friends and Family:   . Frequency of Social Gatherings with Friends and Family:   . Attends Religious Services:   . Active Member of Clubs or Organizations:   . Attends Archivist Meetings:   Marland Kitchen Marital Status:    Past Surgical History:  Procedure Laterality Date  .  kidney stones    . ABDOMINAL ADHESION SURGERY    .  ABDOMINAL HYSTERECTOMY  1995   partial- hemmoraged after surgery-stitch came loose  . APPENDECTOMY  1993   hemmoraged after surgery- stitch came loose  . CERVICAL DISC SURGERY  06/04/2001   with fusion  . CHOLECYSTECTOMY  1985  . colonoscopy    . COLONOSCOPY    . CYSTOSCOPY    . FOOT SURGERY     lt  . jj stent  07/2002   with ureteroscopy, cystoscopy and then removal of stent in office  . LITHOTRIPSY    . LYMPH NODE BIOPSY Right 03/06/2014   Procedure: right groin LYMPH NODE BIOPSY;  Surgeon: Merrie Roof, MD;  Location: [facility];  Service: General;  Laterality: Right;  . LYMPHADENECTOMY N/A 12/29/2015   Procedure: RETROPERITONEAL LYMPHADENECTOMY;  Surgeon: Alexis Frock, MD;  [facility];  Service: Urology;  Laterality: N/A;  . NECK SURGERY  2002  . ROBOT ASSISTED LAPAROSCOPIC NEPHRECTOMY Left 12/29/2015   Procedure: XI ROBOTIC ASSISTED LEFT LAPAROSCOPIC NEPHRECTOMY;  Surgeon: Alexis Frock, MD;  [facility];  Service: Urology;  Laterality: Left;  . TONSILLECTOMY  1976   Past Medical History:  Diagnosis Date  . Anxiety   . Arthritis   . Asthma   . Bipolar 2 disorder ([place])    pt stated, "I have Bipolar 2 and it is remission"  . Bipolar affective ([place])   . Calcifying tendinitis of shoulder   . Carpal tunnel syndrome   . Cervical facet syndrome   . Chronic pain syndrome   . Complication of anesthesia   . Depression   . Diverticulosis   . Falls   . Fibromyalgia   . 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 Full dentures   . Gout   . Headache(784.0)    migraines  . Herpesviral infection   . Hypertension   . IBS (irritable bowel syndrome)    with diarrhea  . Lymphadenopathy   . Myalgia and myositis, unspecified   . PONV (postoperative nausea and vomiting)   . PTSD (post-traumatic stress disorder)   . Renal calculi   . Restless legs syndrome (RLS)   . Thoracic radiculopathy   . Vitamin D deficiency   .  Wears glasses    Temp (!) 97.2 F (36.2 C)   Ht 5\' 5"  (1.651 m)   Wt 151 lb 9.6 oz (68.8 kg)   BMI 25.23 kg/m   Opioid Risk Score:   Fall Risk Score:  `1  Depression screen PHQ 2/9  Depression screen [facility] 2/9 02/04/2020 10/06/2019 04/30/2019 03/06/2019  01/07/2019 11/21/2018 11/12/2018  Decreased Interest 1 1 1 1 1 1 1   Down, Depressed, Hopeless 1 1 1 1 1 1 1   PHQ - 2 Score 2 2 2 2 2 2 2   Altered sleeping - - - - - 1 1  Tired, decreased energy - - - - - 2 1  Change in appetite - - - - - 1 1  Feeling bad or failure about yourself  - - - - - 0 0  Trouble concentrating - - - - - 1 1  Moving slowly or fidgety/restless - - - - - 1 1  Suicidal thoughts - - - - - 0 0  PHQ-9 Score - - - - - 8 7  Difficult doing work/chores - - - - - Somewhat difficult Somewhat difficult  Some recent data might be hidden    Review of Systems  Constitutional: Positive for chills, fever and unexpected weight change.  HENT: Negative.   Eyes: Negative.   Respiratory: Positive for wheezing.   Cardiovascular: Positive for leg swelling.  Gastrointestinal: Positive for abdominal pain, constipation, diarrhea and vomiting.  Endocrine: Negative.   Genitourinary: Negative.   Musculoskeletal: Positive for gait problem.       Spasms  Skin: Negative.   Allergic/Immunologic: Negative.   Neurological: Positive for dizziness, tremors, weakness and numbness.       Tingling  Hematological: Negative.   Psychiatric/Behavioral: Positive for confusion and dysphoric mood. The patient is nervous/anxious.   All other systems reviewed and are negative.      Objective:   Physical Exam Vitals and nursing note reviewed.  Constitutional:      Appearance: Normal appearance.  Neck:     Comments: Cervical Paraspinal Tenderness: C-5-C-6 Cardiovascular:     Rate and Rhythm: Normal rate and regular rhythm.     Pulses: Normal pulses.     Heart sounds: Normal heart sounds.  Pulmonary:     Effort: Pulmonary effort is normal.      Breath sounds: Normal breath sounds.  Musculoskeletal:     Cervical back: Normal range of motion and neck supple.     Comments: Normal Muscle Bulk and Muscle Testing Reveals: Upper Extremities: Full ROM and Muscle Strength 5/5 Bilateral AC Joint Tenderness Thoracic Paraspinal Tenderness: T-7-T-9 Lumbar Paraspinal Tenderness: L-3-L-5 Lower Extremities: Full ROM and Muscle Strength 5/5 Bilateral Lower Extremities Flexion Produces Pain into her Bilateral Lower Extremities Arises from Table with ease Narrow Based Gait   Skin:    General: Skin is warm and dry.  Neurological:     Mental Status: She is alert and oriented to person, place, and time.  Psychiatric:        Mood and Affect: Mood normal.        Behavior: Behavior normal.           Assessment & Plan:  1. History of fibromyalgia with myofascial pain and multiple trigger points.Continue with Heat and exercise Regime. Continue withcurrent medication regimen withgabapentin.02/04/2020 2. Chronic migraine headaches.Continue to Monitor. S/P Botox.on 05/30/2017.02/04/2020 3. Midline Low Back Pain/ Lumbar Spondylosis/Lumbar degenerative disk disease, L4-5/ Lumbar Radiculitis: Continue with HEP and current medication regimen. Refilled: Fentanyl Patch 12 mcg one patch every three days #10 and ContinueHydrocodone 5/325 mg one tablet every 8 hours as needed #70.02/04/2020. 4. 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 Urology Following.02/04/2020. 5. Right CTS: Continue to wear Wrist stabilizer.02/04/2020 6. Cervicalgia/ Cervical RadiculitisCervical Spondylosis: Continuecurrent medication regiment withGabapentin. Continue with HEP and Continue to monitor.02/04/2020 7.  Muscle Spasm: Continuecurrent medication regimen withFlexeril as needed.02/04/2020 9. Bilateral Greater Trochanter Bursitis: Continue to alternate with ice and heat therapy. Continue HEP as Tolerated. Continue to  monitor.02/04/2020  F/U in 1 month  15 minutes of face to face patient care time was spent during this visit. All questions were encouraged and answered.

## 2020-02-07 LAB — TOXASSURE SELECT,+ANTIDEPR,UR

## 2020-02-10 ENCOUNTER — Ambulatory Visit: Payer: Self-pay | Admitting: Adult Health

## 2020-02-13 ENCOUNTER — Telehealth: Payer: Self-pay | Admitting: *Deleted

## 2020-02-13 NOTE — Telephone Encounter (Signed)
Urine drug screen for this encounter is consistent for prescribed medication 

## 2020-02-17 ENCOUNTER — Telehealth: Payer: Self-pay | Admitting: Physician Assistant

## 2020-02-17 NOTE — Telephone Encounter (Signed)
Pt called states she went to  [facility] on [address] for treatment of knee pain ( advised pt [facility] has an UC across the street beside [address] , she was unaware)  --Pt states rcvd tramadol injection w/ benadryl for the pain & was referred to [facility] for further trmt, she just wanted PCP to know.(pt did req [facility] send DOS office notes to [facility].)  --FYI to med staff.  -glh

## 2020-02-17 NOTE — Telephone Encounter (Signed)
Noted. AS, CMA 

## 2020-02-19 ENCOUNTER — Telehealth: Payer: Self-pay | Admitting: Neurology

## 2020-02-19 NOTE — Telephone Encounter (Signed)
Pt gave consent for insurance to be filed for vv(telephone visit) Pt understands that although there may be some limitations with this type of visit, we will take all precautions to reduce any security or privacy concerns.  Pt understands that this will be treated like an in office visit and we will file with pt's insurance, and there may be a patient responsible charge related to this service.

## 2020-02-23 ENCOUNTER — Other Ambulatory Visit: Payer: Self-pay

## 2020-02-23 ENCOUNTER — Encounter: Payer: Self-pay | Admitting: Orthopedic Surgery

## 2020-02-23 ENCOUNTER — Ambulatory Visit (INDEPENDENT_AMBULATORY_CARE_PROVIDER_SITE_OTHER): Payer: Medicare Other | Admitting: Orthopedic Surgery

## 2020-02-23 VITALS — Ht 65.0 in | Wt 151.0 lb

## 2020-02-23 DIAGNOSIS — M170 Bilateral primary osteoarthritis of knee: Secondary | ICD-10-CM | POA: Diagnosis not present

## 2020-02-23 NOTE — Progress Notes (Signed)
Office Visit Note   Patient: Patty Salas           Date of Birth: March 14, 1956           MRN: [id number] Visit Date: 02/23/2020              Requested by: Lorrene Reid, PA-C [place],  [place] [address] PCP: Lorrene Reid, PA-C  Chief Complaint  Patient presents with  . Right Knee - Pain      HPI: Patient is a 64 year old woman who is ambulating with a quad cane she states that she denies any specific injury but this past Sunday had increased swelling and pain.  She has an x-ray from the [facility].  Patient states she has had multiple steroid injections in the past for both her cervical and lumbar spine as well as trigger point injections.  Patient states that she is on a pain clinic contract and is on multiple pain medications.  Assessment & Plan: Visit Diagnoses:  1. Bilateral primary osteoarthritis of knee     Plan: Both knees were injected from the anterior medial portal she tolerated this well she has no restrictions with activities we will reevaluate in 4 weeks.  Follow-Up Instructions: Return in about 4 weeks (around 03/22/2020).   Ortho Exam  Patient is alert, oriented, no adenopathy, well-dressed, normal affect, normal respirato occasions.  Ry effort. Examination patient has a normal gait there is no effusion or crepitation with range of motion of either knee.  Patient is tender to palpation over the medial and lateral joint lines bilaterally collaterals and cruciates are stable.  Radiographs from [facility] services showed osteoarthritic changes with narrowing of the medial joint space and meniscal calcific  Imaging: No results found. No images are attached to the encounter.  Labs: Lab Results  Component Value Date   HGBA1C 5.7 (H) 08/28/2017     Lab Results  Component Value Date   ALBUMIN 4.8 08/28/2017   ALBUMIN 4.1 12/01/2014   ALBUMIN 3.7 01/21/2014    Lab Results  Component Value Date   MG 2.1 08/28/2017   Lab  Results  Component Value Date   VD25OH 23.1 (L) 08/28/2017   VD25OH 37.7 11/03/2016   VD25OH 37.7 11/03/2016    No results found for: PREALBUMIN CBC EXTENDED Latest Ref Rng & Units 08/28/2017 11/03/2016 11/03/2016  WBC 3.4 - 10.8 x10E3/uL 6.1 7.2 7.2  RBC 3.77 - 5.28 x10E6/uL 3.93 - -  HGB 11.1 - 15.9 g/dL 12.0 12.0 12.0  HCT 34.0 - 46.6 % 37.1 36 36  PLT 150 - 379 x10E3/uL 350 344 344  NEUTROABS 1.4 - 7.0 x10E3/uL 4.0 - -  LYMPHSABS 0.7 - 3.1 x10E3/uL 1.4 - -     Body mass index is 25.13 kg/m.  Orders:  No orders of the defined types were placed in this encounter.  No orders of the defined types were placed in this encounter.    Procedures: Large Joint Inj: bilateral knee on 02/23/2020 1:34 PM Indications: pain and diagnostic evaluation Details: 22 G 1.5 in needle, anteromedial approach  Arthrogram: No  Outcome: tolerated well, no immediate complications Procedure, treatment alternatives, risks and benefits explained, specific risks discussed. Consent was given by the patient. Immediately prior to procedure a time out was called to verify the correct patient, procedure, equipment, support staff and site/side marked as required. Patient was prepped and draped in the usual sterile fashion.      Clinical Data: No additional findings.  ROS:  All other systems negative, except as noted in the HPI. Review of Systems  Objective: Vital Signs: Ht 5\' 5"  (1.651 m)   Wt 151 lb (68.5 kg)   BMI 25.13 kg/m   Specialty Comments:  No specialty comments available.  PMFS History: Patient Active Problem List   Diagnosis Date Noted  . Renal cell cancer ([place]) 11/21/2018  . Vitamin D deficiency 08/28/2017  . Hypertension 08/28/2017  . Herpes simplex- oral lesions 08/10/2017  . Bipolar 1 disorder, mixed, moderate ([place]) 08/10/2017  . History of anaphylaxis 08/10/2017  . Postmenopausal syndrome 08/10/2017  . 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 History of kidney cancer 02/22/2016  . Renal mass 12/29/2015  . Left lateral epicondylitis 09/06/2015  . De Quervain's tenosynovitis, right 04/24/2014  . Enlarged lymph node 02/12/2014  . BPPV (benign paroxysmal positional vertigo) 12/05/2013  . Cervical spondylosis without myelopathy 09/17/2013  . Post concussion syndrome 09/17/2013  . Nephrolithiasis 02/11/2013  . Carpal tunnel syndrome 01/01/2013  . Wrist tendonitis 01/10/2012  . Fibromyalgia/myofascial pain syndrome 12/06/2011  . Lumbar spondylosis 12/06/2011  . Lumbar degenerative disc disease 12/06/2011  . Depression with anxiety 12/06/2011  . Migraine aura, persistent, intractable 12/06/2011   Past Medical History:  Diagnosis Date  . Anxiety   . Arthritis   . Asthma   . Bipolar 2 disorder ([place])    pt stated, "I have Bipolar 2 and it is remission"  . Bipolar affective ([place])   . Calcifying tendinitis of shoulder   . Carpal tunnel syndrome   . Cervical facet syndrome   . Chronic pain syndrome   . Complication of anesthesia   . Depression   . Diverticulosis   . Falls   . Fibromyalgia   . 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 Full dentures   . Gout   . Headache(784.0)    migraines  . Herpesviral infection   . Hypertension   . IBS (irritable bowel syndrome)    with diarrhea  . Lymphadenopathy   . Myalgia and myositis, unspecified   . PONV (postoperative nausea and vomiting)   . PTSD (post-traumatic stress disorder)   . Renal calculi   . Restless legs syndrome (RLS)   . Thoracic radiculopathy   . Vitamin D deficiency   . Wears glasses     Family History  Problem Relation Age of Onset  . Cancer Mother        stomach  . Migraines Mother   . Arthritis Mother   . Emphysema Mother   . Heart disease Father   . Hypertension Father   . Cancer Father        colon  . Dementia Father   . Hypertension Brother   . Migraines Brother   . Hypertension Brother   . Migraines  Brother   . Alcohol abuse Brother   . Bipolar disorder Brother   . Migraines Daughter   . Arthritis Maternal Grandmother   . Cancer Maternal Grandmother        stomach  . Diabetes Maternal Grandmother   . Stroke Maternal Grandmother   . Cancer Maternal Aunt        lung  . Emphysema Maternal Aunt   . Cancer Paternal Uncle        colon  . Hypertension Maternal Aunt   . Heart disease Maternal Aunt   . Cancer Paternal Uncle        lung  Past Surgical History:  Procedure Laterality Date  .  kidney stones    . ABDOMINAL ADHESION SURGERY    . ABDOMINAL HYSTERECTOMY  1995   partial- hemmoraged after surgery-stitch came loose  . APPENDECTOMY  1993   hemmoraged after surgery- stitch came loose  . CERVICAL DISC SURGERY  06/04/2001   with fusion  . CHOLECYSTECTOMY  1985  . colonoscopy    . COLONOSCOPY    . CYSTOSCOPY    . FOOT SURGERY     lt  . jj stent  07/2002   with ureteroscopy, cystoscopy and then removal of stent in office  . LITHOTRIPSY    . LYMPH NODE BIOPSY Right 03/06/2014   Procedure: right groin LYMPH NODE BIOPSY;  Surgeon: Merrie Roof, MD;  Location: [facility];  Service: General;  Laterality: Right;  . LYMPHADENECTOMY N/A 12/29/2015   Procedure: RETROPERITONEAL LYMPHADENECTOMY;  Surgeon: Alexis Frock, MD;  Location: [facility];  Service: Urology;  Laterality: N/A;  . NECK SURGERY  2002  . ROBOT ASSISTED LAPAROSCOPIC NEPHRECTOMY Left 12/29/2015   Procedure: XI ROBOTIC ASSISTED LEFT LAPAROSCOPIC NEPHRECTOMY;  Surgeon: Alexis Frock, MD;  Location: [facility];  Service: Urology;  Laterality: Left;  . TONSILLECTOMY  1976   Social History   Occupational History  . Occupation: HOUSEWIFE    Employer: UNEMPLOYED  Tobacco Use  . Smoking status: Never Smoker  . Smokeless tobacco: Never Used  Substance and Sexual Activity  . Alcohol use: No  . Drug use: No  . Sexual activity: Not on file

## 2020-02-24 ENCOUNTER — Telehealth: Payer: Self-pay

## 2020-02-24 NOTE — Telephone Encounter (Signed)
Receive fax that PA was needed for [place]. Once I submitted PA they stated Information regarding your request  The patient currently has access to the requested medication and a Prior Authorization is not needed for the patient/medication.Patty Salas

## 2020-03-03 ENCOUNTER — Encounter: Payer: Medicare Other | Admitting: Registered Nurse

## 2020-03-09 ENCOUNTER — Encounter: Payer: Medicare Other | Admitting: Registered Nurse

## 2020-03-10 ENCOUNTER — Other Ambulatory Visit: Payer: Self-pay

## 2020-03-10 ENCOUNTER — Encounter: Payer: Self-pay | Admitting: Registered Nurse

## 2020-03-10 ENCOUNTER — Encounter: Payer: Medicare Other | Attending: Physical Medicine & Rehabilitation | Admitting: Registered Nurse

## 2020-03-10 VITALS — BP 147/80 | HR 104 | Temp 97.2°F | Ht 65.0 in | Wt 150.0 lb

## 2020-03-10 DIAGNOSIS — M546 Pain in thoracic spine: Secondary | ICD-10-CM | POA: Diagnosis present

## 2020-03-10 DIAGNOSIS — Z79891 Long term (current) use of opiate analgesic: Secondary | ICD-10-CM | POA: Diagnosis present

## 2020-03-10 DIAGNOSIS — M7062 Trochanteric bursitis, left hip: Secondary | ICD-10-CM | POA: Insufficient documentation

## 2020-03-10 DIAGNOSIS — M5416 Radiculopathy, lumbar region: Secondary | ICD-10-CM

## 2020-03-10 DIAGNOSIS — M7918 Myalgia, other site: Secondary | ICD-10-CM | POA: Insufficient documentation

## 2020-03-10 DIAGNOSIS — M7061 Trochanteric bursitis, right hip: Secondary | ICD-10-CM | POA: Insufficient documentation

## 2020-03-10 DIAGNOSIS — Z5181 Encounter for therapeutic drug level monitoring: Secondary | ICD-10-CM | POA: Insufficient documentation

## 2020-03-10 DIAGNOSIS — G894 Chronic pain syndrome: Secondary | ICD-10-CM | POA: Insufficient documentation

## 2020-03-10 DIAGNOSIS — G8929 Other chronic pain: Secondary | ICD-10-CM | POA: Diagnosis present

## 2020-03-10 DIAGNOSIS — M4716 Other spondylosis with myelopathy, lumbar region: Secondary | ICD-10-CM | POA: Diagnosis present

## 2020-03-10 DIAGNOSIS — M542 Cervicalgia: Secondary | ICD-10-CM | POA: Diagnosis not present

## 2020-03-10 DIAGNOSIS — G43519 Persistent migraine aura without cerebral infarction, intractable, without status migrainosus: Secondary | ICD-10-CM | POA: Insufficient documentation

## 2020-03-10 DIAGNOSIS — M5412 Radiculopathy, cervical region: Secondary | ICD-10-CM | POA: Insufficient documentation

## 2020-03-10 DIAGNOSIS — M797 Fibromyalgia: Secondary | ICD-10-CM | POA: Diagnosis present

## 2020-03-10 DIAGNOSIS — M47812 Spondylosis without myelopathy or radiculopathy, cervical region: Secondary | ICD-10-CM | POA: Insufficient documentation

## 2020-03-10 MED ORDER — HYDROCODONE-ACETAMINOPHEN 5-325 MG PO TABS: 1.0000 | ORAL_TABLET | Freq: Three times a day (TID) | ORAL | 0 refills | Status: DC | PRN

## 2020-03-10 MED ORDER — FENTANYL 12 MCG/HR TD PT72: 1.0000 | MEDICATED_PATCH | TRANSDERMAL | 0 refills | Status: DC

## 2020-03-10 MED ORDER — CYCLOBENZAPRINE HCL 10 MG PO TABS: 10.0000 mg | ORAL_TABLET | Freq: Three times a day (TID) | ORAL | 4 refills | Status: DC

## 2020-03-10 NOTE — Progress Notes (Signed)
Subjective:     Patient ID: Patty Salas, female   DOB: November 04, 1955, 64 y.o.   MRN: [id number]  HPI: Patty Salas is a 64 y.o. female who returns for follow up appointment for chronic pain and medication refill. She states her pain is located in her neck radiating into her bilateral shoulders, mid- lower back pain radiating into her bilateral lower extremities. Also reports bilateral hip pain and bilateral knee pain. Patty Salas states she received bilateral knee injection by Dr Sharol Given on 02/23/2020.  She rates her pain 10. Her current exercise regime is walking and performing stretching exercises.  Patty Salas Morphine equivalent is 44.02  MME.  She is also prescribed Lorazepam by Dr. Reece Levy.We have discussed the black box warning of using opioids and benzodiazepines. I highlighted the dangers of using these drugs together and discussed the adverse events including respiratory suppression, overdose, cognitive impairment and importance of compliance with current regimen. We will continue to monitor and adjust as indicated.   She  is being closely monitored and under the care of her psychiatrist Dr Reece Levy.    Pain Inventory Average Pain 10 Pain Right Now 10 My pain is constant, sharp, burning and stabbing  In the last 24 hours, has pain interfered with the following? General activity 10 Relation with others 10 Enjoyment of life 10 What TIME of day is your pain at its worst? All the time. Sleep (in general) poor  to fair  Pain is worse with: walking, bending, sitting, inactivity, standing and some activites Pain improves with: rest, heat/ice, therapy/exercise, medication and injections Relief from Meds: 4  Mobility walk with assistance use a cane use a walker  Function employed # of hrs/week disable not employed: date last employed years?  Neuro/Psych bladder control problems  Prior Studies Any changes since last visit?  yes x-rays  Physicians involved in your care    Family  History  Problem Relation Age of Onset  . Cancer Mother        stomach  . Migraines Mother   . Arthritis Mother   . Emphysema Mother   . Heart disease Father   . Hypertension Father   . Cancer Father        colon  . Dementia Father   . Hypertension Brother   . Migraines Brother   . Hypertension Brother   . Migraines Brother   . Alcohol abuse Brother   . Bipolar disorder Brother   . Migraines Daughter   . Arthritis Maternal Grandmother   . Cancer Maternal Grandmother        stomach  . Diabetes Maternal Grandmother   . Stroke Maternal Grandmother   . Cancer Maternal Aunt        lung  . Emphysema Maternal Aunt   . Cancer Paternal Uncle        colon  . Hypertension Maternal Aunt   . Heart disease Maternal Aunt   . Cancer Paternal Uncle        lung   Social History   Socioeconomic History  . Marital status: Married    Spouse name: Not on file  . Number of children: 1  . Years of education: COLLEGE1  . Highest education level: Not on file  Occupational History  . Occupation: HOUSEWIFE    Employer: UNEMPLOYED  Tobacco Use  . Smoking status: Never Smoker  . Smokeless tobacco: Never Used  Substance and Sexual Activity  . Alcohol use: No  . Drug use: No  .  Sexual activity: Not on file  Other Topics Concern  . Not on file  Social History Narrative   Patient is right handed.   Patient drinks caffeine occasionally.   Social Determinants of Health   Financial Resource Strain:   . Difficulty of Paying Living Expenses:   Food Insecurity:   . Worried About Charity fundraiser in the Last Year:   . Arboriculturist in the Last Year:   Transportation Needs:   . Film/video editor (Medical):   Marland Kitchen Lack of Transportation (Non-Medical):   Physical Activity:   . Days of Exercise per Week:   . Minutes of Exercise per Session:   Stress:   . Feeling of Stress :   Social Connections:   . Frequency of Communication with Friends and Family:   . Frequency of Social  Gatherings with Friends and Family:   . Attends Religious Services:   . Active Member of Clubs or Organizations:   . Attends Archivist Meetings:   Marland Kitchen Marital Status:    Past Surgical History:  Procedure Laterality Date  .  kidney stones    . ABDOMINAL ADHESION SURGERY    . ABDOMINAL HYSTERECTOMY  1995   partial- hemmoraged after surgery-stitch came loose  . APPENDECTOMY  1993   hemmoraged after surgery- stitch came loose  . CERVICAL DISC SURGERY  06/04/2001   with fusion  . CHOLECYSTECTOMY  1985  . colonoscopy    . COLONOSCOPY    . CYSTOSCOPY    . FOOT SURGERY     lt  . jj stent  07/2002   with ureteroscopy, cystoscopy and then removal of stent in office  . LITHOTRIPSY    . LYMPH NODE BIOPSY Right 03/06/2014   Procedure: right groin LYMPH NODE BIOPSY;  Surgeon: Merrie Roof, MD;  Location: [facility];  Service: General;  Laterality: Right;  . LYMPHADENECTOMY N/A 12/29/2015   Procedure: RETROPERITONEAL LYMPHADENECTOMY;  Surgeon: Alexis Frock, MD;  [facility];  Service: Urology;  Laterality: N/A;  . NECK SURGERY  2002  . ROBOT ASSISTED LAPAROSCOPIC NEPHRECTOMY Left 12/29/2015   Procedure: XI ROBOTIC ASSISTED LEFT LAPAROSCOPIC NEPHRECTOMY;  Surgeon: Alexis Frock, MD;  [facility];  Service: Urology;  Laterality: Left;  . TONSILLECTOMY  1976   Past Medical History:  Diagnosis Date  . Anxiety   . Arthritis   . Asthma   . Bipolar 2 disorder ([place])    pt stated, "I have Bipolar 2 and it is remission"  . Bipolar affective ([place])   . Calcifying tendinitis of shoulder   . Carpal tunnel syndrome   . Cervical facet syndrome   . Chronic pain syndrome   . Complication of anesthesia   . Depression   . Diverticulosis   . Falls   . Fibromyalgia   . 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 Full dentures   . Gout   . Headache(784.0)    migraines  . Herpesviral infection   . Hypertension   . IBS (irritable  bowel syndrome)    with diarrhea  . Lymphadenopathy   . Myalgia and myositis, unspecified   . PONV (postoperative nausea and vomiting)   . PTSD (post-traumatic stress disorder)   . Renal calculi   . Restless legs syndrome (RLS)   . Thoracic radiculopathy   . Vitamin D deficiency   . Wears glasses    BP (!) 147/80   Pulse (!) 102  Temp (!) 97.2 F (36.2 C)   Ht 5\' 5"  (1.651 m)   Wt 150 lb (68 kg)   SpO2 96%   BMI 24.96 kg/m   Opioid Risk Score:   Fall Risk Score:  `1  Depression screen PHQ 2/9  Depression screen [facility] 2/9 02/04/2020 10/06/2019 04/30/2019 03/06/2019 01/07/2019 11/21/2018 11/12/2018  Decreased Interest 1 1 1 1 1 1 1   Down, Depressed, Hopeless 1 1 1 1 1 1 1   PHQ - 2 Score 2 2 2 2 2 2 2   Altered sleeping - - - - - 1 1  Tired, decreased energy - - - - - 2 1  Change in appetite - - - - - 1 1  Feeling bad or failure about yourself  - - - - - 0 0  Trouble concentrating - - - - - 1 1  Moving slowly or fidgety/restless - - - - - 1 1  Suicidal thoughts - - - - - 0 0  PHQ-9 Score - - - - - 8 7  Difficult doing work/chores - - - - - Somewhat difficult Somewhat difficult  Some recent data might be hidden   Review of Systems  Constitutional: Positive for chills and diaphoresis.  HENT: Negative.   Eyes: Negative.   Respiratory: Negative.   Cardiovascular: Negative.   Gastrointestinal: Positive for diarrhea, nausea and vomiting.  Endocrine: Negative.   Genitourinary: Negative.   Musculoskeletal: Negative.        Limb swelling-both knees  Skin: Negative.   Allergic/Immunologic: Negative.   Neurological: Negative.   Hematological: Negative.   Psychiatric/Behavioral: Negative.        Objective:   Physical Exam Vitals and nursing note reviewed.  Constitutional:      Appearance: Normal appearance.  Neck:     Comments: Cervical Paraspinal Tenderness: C-5-C-6 Cardiovascular:     Rate and Rhythm: Normal rate and regular rhythm.     Pulses: Normal pulses.     Heart  sounds: Normal heart sounds.  Pulmonary:     Effort: Pulmonary effort is normal.     Breath sounds: Normal breath sounds.  Musculoskeletal:     Cervical back: Normal range of motion and neck supple.     Comments: Normal Muscle Bulk and Muscle Testing Reveals:  Upper Extremities: Full ROM and Muscle Strength 5/5 Bilateral AC Joint Tenderness  Thoracic Hypersensitivity Lumbar Paraspinal Tenderness: L-3-L-5 Bilateral Greater Trochanteric Tenderness Lower Extremities: Decreased  ROM and Muscle Strength 5/5 Bilateral Lower Extremities Flexion Produces Pain into Bilateral Patellas' Arises from Table Slowly  Gait   Skin:    General: Skin is warm and dry.  Neurological:     Mental Status: She is alert and oriented to person, place, and time.  Psychiatric:        Mood and Affect: Mood normal.        Behavior: Behavior normal.       Assessment and Plan   1. History of fibromyalgia with myofascial pain and multiple trigger points.Continue with Heat and exercise Regime. Continue withcurrent medication regimen withgabapentin.03/10/2020 2. Chronic migraine headaches.Continue to Monitor. S/P Botox.on 05/30/2017.03/10/2020 3. Midline Low Back Pain/ Lumbar Spondylosis/Lumbar degenerative disk disease, L4-5/ Lumbar Radiculitis: Continue with HEP and current medication regimen. Refilled: Fentanyl Patch 12 mcg one patch every three days #10 and ContinueHydrocodone 5/325 mg one tablet every 8 hours as needed #70.03/10/2020. 4. 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 Urology Following.03/10/2020. 5. Right CTS: Continue to wear Wrist stabilizer.03/10/2020 6. Cervicalgia/  Cervical RadiculitisCervical Spondylosis: Continuecurrent medication regiment withGabapentin. Continue with HEP and Continue to monitor.03/10/2020 7. Muscle Spasm: Continuecurrent medication regimen withFlexeril as needed.03/10/2020 9. Bilateral Greater Trochanter Bursitis: Continue  to alternate with ice and heat therapy. Continue HEP as Tolerated. Continue to monitor.03/10/2020 10. Bilateral Knee Pain: S/P Knee Injection by Dr Sharol Given on 02/23/2020. Orthopedics Following.    F/U in 1 month  15 minutes of face to face patient care time was spent during this visit. All questions were encouraged and answered.

## 2020-03-16 ENCOUNTER — Telehealth: Payer: Self-pay | Admitting: Neurology

## 2020-03-16 MED ORDER — ALMOTRIPTAN MALATE 12.5 MG PO TABS: ORAL_TABLET | ORAL | 0 refills | Status: DC

## 2020-03-16 NOTE — Telephone Encounter (Signed)
I r/s her virtual appt for 6/28. She asked that we refill her axert prescription. She asked for the 90 day supply, said she knows she has to pay $271 for it and that's ok. She took a pill yesterday and will take the last one she has this weekend.

## 2020-03-16 NOTE — Addendum Note (Signed)
Addended by: Marcial Pacas on: 03/16/2020 06:36 PM   Modules accepted: Orders

## 2020-03-17 ENCOUNTER — Telehealth: Payer: Medicare Other | Admitting: Neurology

## 2020-03-22 ENCOUNTER — Other Ambulatory Visit: Payer: Self-pay | Admitting: Physician Assistant

## 2020-03-22 ENCOUNTER — Ambulatory Visit (INDEPENDENT_AMBULATORY_CARE_PROVIDER_SITE_OTHER): Payer: Medicare Other | Admitting: Orthopedic Surgery

## 2020-03-22 ENCOUNTER — Encounter: Payer: Self-pay | Admitting: Orthopedic Surgery

## 2020-03-22 ENCOUNTER — Other Ambulatory Visit: Payer: Self-pay

## 2020-03-22 VITALS — Ht 65.0 in | Wt 150.0 lb

## 2020-03-22 DIAGNOSIS — M170 Bilateral primary osteoarthritis of knee: Secondary | ICD-10-CM

## 2020-03-22 DIAGNOSIS — Z0000 Encounter for general adult medical examination without abnormal findings: Secondary | ICD-10-CM

## 2020-03-22 DIAGNOSIS — E559 Vitamin D deficiency, unspecified: Secondary | ICD-10-CM

## 2020-03-22 DIAGNOSIS — I10 Essential (primary) hypertension: Secondary | ICD-10-CM

## 2020-03-22 NOTE — Progress Notes (Signed)
Office Visit Note   Patient: Patty Salas           Date of Birth: [birth year]           MRN: [id number] Visit Date: 03/22/2020              Requested by: Lorrene Reid, PA-C [address] [place],  [place] [address] PCP: Lorrene Reid, PA-C  Chief Complaint  Patient presents with  . Left Knee - Follow-up    02/23/20 bilateral knee pain follow up.   . Right Knee - Follow-up      HPI: Patient is a 64 year old woman who presents in follow-up for bilateral knee pain.  Patient states that the injections worked for several weeks but she is not 100% better.  Patient states she does have knee pain with activities of daily living.  She currently uses a 4 pronged cane.  Assessment & Plan: Visit Diagnoses:  1. Bilateral primary osteoarthritis of knee     Plan: I have recommended patient start with physical therapy for strengthening of her knees.  Discussed that she should not need surgical intervention.  Follow-Up Instructions: No follow-ups on file.   Ortho Exam  Patient is alert, oriented, no adenopathy, well-dressed, normal affect, normal respiratory effort. Examination patient ambulates with a cane.  She has no effusion in either knee there is no redness no cellulitis there is no tenderness to palpation collaterals and cruciates are stable.  She has no crepitation with range of motion.  Imaging: No results found. No images are attached to the encounter.  Labs: Lab Results  Component Value Date   HGBA1C 5.7 (H) 08/28/2017     Lab Results  Component Value Date   ALBUMIN 4.8 08/28/2017   ALBUMIN 4.1 12/01/2014   ALBUMIN 3.7 01/21/2014    Lab Results  Component Value Date   MG 2.1 08/28/2017   Lab Results  Component Value Date   VD25OH 23.1 (L) 08/28/2017   VD25OH 37.7 11/03/2016   VD25OH 37.7 11/03/2016    No results found for: PREALBUMIN CBC EXTENDED Latest Ref Rng & Units 08/28/2017 11/03/2016 11/03/2016  WBC 3.4 - 10.8 x10E3/uL 6.1 7.2 7.2    RBC 3.77 - 5.28 x10E6/uL 3.93 - -  HGB 11.1 - 15.9 g/dL 12.0 12.0 12.0  HCT 34.0 - 46.6 % 37.1 36 36  PLT 150 - 379 x10E3/uL 350 344 344  NEUTROABS 1 - 7 x10E3/uL 4.0 - -  LYMPHSABS 0 - 3 x10E3/uL 1.4 - -     Body mass index is 24.96 kg/m.  Orders:  Orders Placed This Encounter  Procedures  . Ambulatory referral to Physical Therapy   No orders of the defined types were placed in this encounter.    Procedures: No procedures performed  Clinical Data: No additional findings.  ROS:  All other systems negative, except as noted in the HPI. Review of Systems  Objective: Vital Signs: Ht 5\' 5"  (1.651 m)   Wt 150 lb (68 kg)   BMI 24.96 kg/m   Specialty Comments:  No specialty comments available.  PMFS History: Patient Active Problem List   Diagnosis Date Noted  . Renal cell cancer ([place]) 11/21/2018  . Vitamin D deficiency 08/28/2017  . Hypertension 08/28/2017  . Herpes simplex- oral lesions 08/10/2017  . Bipolar 1 disorder, mixed, moderate ([place]) 08/10/2017  . History of anaphylaxis 08/10/2017  . Postmenopausal syndrome 08/10/2017  . 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  History of kidney cancer 02/22/2016  . Renal mass 12/29/2015  . Left lateral epicondylitis 09/06/2015  . De Quervain's tenosynovitis, right 04/24/2014  . Enlarged lymph node 02/12/2014  . BPPV (benign paroxysmal positional vertigo) 12/05/2013  . Cervical spondylosis without myelopathy 09/17/2013  . Post concussion syndrome 09/17/2013  . Nephrolithiasis 02/11/2013  . Carpal tunnel syndrome 01/01/2013  . Wrist tendonitis 01/10/2012  . Fibromyalgia/myofascial pain syndrome 12/06/2011  . Lumbar spondylosis 12/06/2011  . Lumbar degenerative disc disease 12/06/2011  . Depression with anxiety 12/06/2011  . Migraine aura, persistent, intractable 12/06/2011   Past Medical History:  Diagnosis Date  . Anxiety   . Arthritis   . Asthma   . Bipolar 2 disorder ([place])     pt stated, "I have Bipolar 2 and it is remission"  . Bipolar affective ([place])   . Calcifying tendinitis of shoulder   . Carpal tunnel syndrome   . Cervical facet syndrome   . Chronic pain syndrome   . Complication of anesthesia   . Depression   . Diverticulosis   . Falls   . Fibromyalgia   . 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 Full dentures   . Gout   . Headache(784.0)    migraines  . Herpesviral infection   . Hypertension   . IBS (irritable bowel syndrome)    with diarrhea  . Lymphadenopathy   . Myalgia and myositis, unspecified   . PONV (postoperative nausea and vomiting)   . PTSD (post-traumatic stress disorder)   . Renal calculi   . Restless legs syndrome (RLS)   . Thoracic radiculopathy   . Vitamin D deficiency   . Wears glasses     Family History  Problem Relation Age of Onset  . Cancer Mother        stomach  . Migraines Mother   . Arthritis Mother   . Emphysema Mother   . Heart disease Father   . Hypertension Father   . Cancer Father        colon  . Dementia Father   . Hypertension Brother   . Migraines Brother   . Hypertension Brother   . Migraines Brother   . Alcohol abuse Brother   . Bipolar disorder Brother   . Migraines Daughter   . Arthritis Maternal Grandmother   . Cancer Maternal Grandmother        stomach  . Diabetes Maternal Grandmother   . Stroke Maternal Grandmother   . Cancer Maternal Aunt        lung  . Emphysema Maternal Aunt   . Cancer Paternal Uncle        colon  . Hypertension Maternal Aunt   . Heart disease Maternal Aunt   . Cancer Paternal Uncle        lung    Past Surgical History:  Procedure Laterality Date  .  kidney stones    . ABDOMINAL ADHESION SURGERY    . ABDOMINAL HYSTERECTOMY  1995   partial- hemmoraged after surgery-stitch came loose  . APPENDECTOMY  1993   hemmoraged after surgery- stitch came loose  . CERVICAL DISC SURGERY  06/04/2001   with fusion  .  CHOLECYSTECTOMY  1985  . colonoscopy    . COLONOSCOPY    . CYSTOSCOPY    . FOOT SURGERY     lt  . jj stent  07/2002   with ureteroscopy, cystoscopy and then removal of stent in office  . LITHOTRIPSY    .  LYMPH NODE BIOPSY Right 03/06/2014   Procedure: right groin LYMPH NODE BIOPSY;  Surgeon: Merrie Roof, MD;  Location: [facility];  Service: General;  Laterality: Right;  . LYMPHADENECTOMY N/A 12/29/2015   Procedure: RETROPERITONEAL LYMPHADENECTOMY;  Surgeon: Alexis Frock, MD;  Location: [facility];  Service: Urology;  Laterality: N/A;  . NECK SURGERY  2002  . ROBOT ASSISTED LAPAROSCOPIC NEPHRECTOMY Left 12/29/2015   Procedure: XI ROBOTIC ASSISTED LEFT LAPAROSCOPIC NEPHRECTOMY;  Surgeon: Alexis Frock, MD;  Location: [facility];  Service: Urology;  Laterality: Left;  . TONSILLECTOMY  1976   Social History   Occupational History  . Occupation: HOUSEWIFE    Employer: UNEMPLOYED  Tobacco Use  . Smoking status: Never Smoker  . Smokeless tobacco: Never Used  Substance and Sexual Activity  . Alcohol use: No  . Drug use: No  . Sexual activity: Not on file

## 2020-03-24 ENCOUNTER — Other Ambulatory Visit: Payer: Self-pay

## 2020-03-24 ENCOUNTER — Encounter: Payer: Medicare Other | Admitting: Physician Assistant

## 2020-03-24 ENCOUNTER — Other Ambulatory Visit: Payer: Self-pay | Admitting: Neurology

## 2020-03-24 ENCOUNTER — Other Ambulatory Visit: Payer: Medicare Other

## 2020-03-24 ENCOUNTER — Ambulatory Visit: Payer: Medicare Other | Admitting: Physician Assistant

## 2020-03-24 DIAGNOSIS — I10 Essential (primary) hypertension: Secondary | ICD-10-CM

## 2020-03-24 DIAGNOSIS — E559 Vitamin D deficiency, unspecified: Secondary | ICD-10-CM

## 2020-03-24 DIAGNOSIS — Z0000 Encounter for general adult medical examination without abnormal findings: Secondary | ICD-10-CM

## 2020-03-24 MED ORDER — ALMOTRIPTAN MALATE 12.5 MG PO TABS: ORAL_TABLET | ORAL | 0 refills | Status: DC

## 2020-03-24 NOTE — Telephone Encounter (Signed)
Per Jael pt has called back and was informed Rx sent.

## 2020-03-24 NOTE — Telephone Encounter (Signed)
Patient has an appointment on June 28 with Sarah NP to discuss almotriptan (AXERT. Pt was given a refill on 03/16/2020 by work in [place] denied.

## 2020-03-24 NOTE — Telephone Encounter (Signed)
Checked Rx status. It was sent to pharmacy but never showed receipt confirmation by pharmacy. I sent this again and it now states receipt confirmed by pharmacy.

## 2020-03-24 NOTE — Addendum Note (Signed)
Addended by: Gildardo Griffes on: 03/24/2020 11:46 AM   Modules accepted: Orders

## 2020-03-24 NOTE — Telephone Encounter (Signed)
Pt states she was informed at the pharmacy that they never received a prescription for the pt's almotriptan (AXERT) 12.5 MG tablet Pt is out and is really needing this medication and is wanting to know if the RN can resend it in for her. Please advise.

## 2020-03-25 LAB — COMPREHENSIVE METABOLIC PANEL
ALT: 29 IU/L (ref 0–32)
AST: 34 IU/L (ref 0–40)
Albumin/Globulin Ratio: 1.4 (ref 1.2–2.2)
Albumin: 4.3 g/dL (ref 3.8–4.8)
Alkaline Phosphatase: 118 IU/L (ref 48–121)
BUN/Creatinine Ratio: 19 (ref 12–28)
BUN: 13 mg/dL (ref 8–27)
Bilirubin Total: 0.4 mg/dL (ref 0.0–1.2)
CO2: 29 mmol/L (ref 20–29)
Calcium: 9.7 mg/dL (ref 8.7–10.3)
Chloride: 99 mmol/L (ref 96–106)
Creatinine, Ser: 0.7 mg/dL (ref 0.57–1.00)
GFR calc Af Amer: 105 mL/min/{1.73_m2} (ref >59)
GFR calc non Af Amer: 91 mL/min/{1.73_m2} (ref >59)
Globulin, Total: 3.1 g/dL (ref 1.5–4.5)
Glucose: 93 mg/dL (ref 65–99)
Potassium: 4.6 mmol/L (ref 3.5–5.2)
Sodium: 139 mmol/L (ref 134–144)
Total Protein: 7.4 g/dL (ref 6.0–8.5)

## 2020-03-25 LAB — LIPID PANEL
Chol/HDL Ratio: 3.3 ratio (ref 0.0–4.4)
Cholesterol, Total: 221 mg/dL — ABNORMAL HIGH (ref 100–199)
HDL: 68 mg/dL (ref >39)
LDL Chol Calc (NIH): 126 mg/dL — ABNORMAL HIGH (ref 0–99)
Triglycerides: 156 mg/dL — ABNORMAL HIGH (ref 0–149)
VLDL Cholesterol Cal: 27 mg/dL (ref 5–40)

## 2020-03-25 LAB — CBC
Hematocrit: 35.8 % (ref 34.0–46.6)
Hemoglobin: 11.9 g/dL (ref 11.1–15.9)
MCH: 30.7 pg (ref 26.6–33.0)
MCHC: 33.2 g/dL (ref 31.5–35.7)
MCV: 92 fL (ref 79–97)
Platelets: 296 10*3/uL (ref 150–450)
RBC: 3.88 x10E6/uL (ref 3.77–5.28)
RDW: 12.7 % (ref 11.7–15.4)
WBC: 5.5 10*3/uL (ref 3.4–10.8)

## 2020-03-25 LAB — TSH: TSH: 1.01 u[IU]/mL (ref 0.450–4.500)

## 2020-03-25 LAB — VITAMIN D 25 HYDROXY (VIT D DEFICIENCY, FRACTURES): Vit D, 25-Hydroxy: 22.5 ng/mL — ABNORMAL LOW (ref 30.0–100.0)

## 2020-03-29 ENCOUNTER — Encounter: Payer: Self-pay | Admitting: Neurology

## 2020-03-29 ENCOUNTER — Telehealth: Payer: Self-pay | Admitting: Neurology

## 2020-03-29 ENCOUNTER — Telehealth (INDEPENDENT_AMBULATORY_CARE_PROVIDER_SITE_OTHER): Payer: Medicare Other | Admitting: Neurology

## 2020-03-29 DIAGNOSIS — G43519 Persistent migraine aura without cerebral infarction, intractable, without status migrainosus: Secondary | ICD-10-CM

## 2020-03-29 MED ORDER — EMGALITY 120 MG/ML ~~LOC~~ SOAJ: 240.0000 mg | SUBCUTANEOUS | 0 refills | Status: DC

## 2020-03-29 MED ORDER — EMGALITY 120 MG/ML ~~LOC~~ SOAJ: 120.0000 mg | SUBCUTANEOUS | 11 refills | Status: DC

## 2020-03-29 NOTE — Telephone Encounter (Signed)
Pt has called to inform that 1 injection of the Galcanezumab-gnlm (EMGALITY) 120 MG/ML SOAJ would be over $1000.00 she will not be able to take this medication due to the cost.  Please call

## 2020-03-29 NOTE — Progress Notes (Signed)
Virtual Visit via Telephone visit  I connected with Patty Salas on 03/29/20 at  2:15 PM EDT by a video enabled telemedicine application and verified that I am speaking with the correct person using two identifiers.  Location: Patient: at her home Provider: In the office    I discussed the limitations of evaluation and management by telemedicine and the availability of in person appointments. The patient expressed understanding and agreed to proceed.  History of Present Illness: 03/29/2020 SS: Patty Salas is a 64 year old female history of intractable migraine headache.  She has previously not tolerated Botox injections.She was going to be tried on Aimovig, but patient has reported latex allergy, along with 58 other allergies. Ajovy was ordered, but was too expensive now on Medicare. At this point, is having 12-14 headaches a month, limits herself to Axert twice a week. Other things she does: take a shower, wash her hair, lay down for headache relief. She takes phenergan if needed 12.5 mg as needed. She wants to try another injectable medication for migraine prevention. She is taking oxybutynin, makes her dizziness worse, also IBS (working with urology they want her to stay on a little longer). She is trying to wean off Norco and fentanyl patch with pain management. Presents today for telephone visit.   04/10/2019 Dr. Jannifer Franklin: Patty Salas is a 64 year old right handed white female with a history of intractable migraine headache. She reports that the headaches are activated by weather changes and by emotional stress. She is trying to get off of the hydrocodone and eventually the fentanyl and the ativan. She will take Axert if needed, no more than twice a week. She denies a lot of nausea with the headache. She does have a history of renal cell carcinoma that is in remission, she has had multiple renal calculi.  She reports a recent diagnosis of right de Quervain's tenosynovitis.  She has history of  episodic vertigo but this has been improved by the use of Epley maneuvers.  The patient has been on a multitude of medications for her migraine that have been not been tolerated or have not been effective.  She did not tolerate Botox injections.   Observations/Objective: Via telephone, is alert and oriented, speech is clear and concise   Assessment and Plan: 1. Intractable migraine headache   I will order Emgality injection for migraine headache, 240 mg monthly injection for loading dose, 120 mg monthly injections going forward. She will remain on Axert and phenergan as needed. Doesn't need refill on Axert or Phenergan at this time. She is allergic to latex, therefore can't have Aimovig, Ajovy was too expensive. She has 58 medication allergies.   Follow Up Instructions: 6 months, can let us know how she does with Emgality, may want to come in for nurses visit to do Emgality injection the first time.  09/30/2020 2:15   I discussed the assessment and treatment plan with the patient. The patient was provided an opportunity to ask questions and all were answered. The patient agreed with the plan and demonstrated an understanding of the instructions.   The patient was advised to call back or seek an in-person evaluation if the symptoms worsen or if the condition fails to improve as anticipated.  I spent 30 minutes of face-to-face and non-face-to-face time with patient.  This included previsit chart review, lab review, study review, order entry, electronic health record documentation, patient education.  Butler Denmark, Laqueta Jean, DNP  [facility] [address],  [place], [place] [address] [phone]

## 2020-03-29 NOTE — Progress Notes (Signed)
I have read the note, and I agree with the clinical assessment and plan.  Parthenia Tellefsen K Lauralie Blacksher   

## 2020-04-01 ENCOUNTER — Ambulatory Visit: Payer: Medicare Other | Admitting: Physician Assistant

## 2020-04-06 ENCOUNTER — Telehealth: Payer: Self-pay

## 2020-04-06 NOTE — Telephone Encounter (Signed)
Patty Salas has an appointment scheduled for tomorrow. She would like to make it a tele-visit. Is it possible? She is dehydrated and not feeling well.

## 2020-04-07 ENCOUNTER — Encounter: Payer: Medicare Other | Admitting: Registered Nurse

## 2020-04-08 ENCOUNTER — Other Ambulatory Visit: Payer: Self-pay

## 2020-04-09 ENCOUNTER — Encounter: Payer: Medicare Other | Attending: Physical Medicine & Rehabilitation | Admitting: Registered Nurse

## 2020-04-09 ENCOUNTER — Other Ambulatory Visit: Payer: Self-pay

## 2020-04-09 VITALS — Temp 98.8°F | Ht 65.0 in | Wt 150.8 lb

## 2020-04-09 DIAGNOSIS — M7062 Trochanteric bursitis, left hip: Secondary | ICD-10-CM

## 2020-04-09 DIAGNOSIS — M4716 Other spondylosis with myelopathy, lumbar region: Secondary | ICD-10-CM

## 2020-04-09 DIAGNOSIS — M7061 Trochanteric bursitis, right hip: Secondary | ICD-10-CM

## 2020-04-09 DIAGNOSIS — Z79891 Long term (current) use of opiate analgesic: Secondary | ICD-10-CM | POA: Insufficient documentation

## 2020-04-09 DIAGNOSIS — M542 Cervicalgia: Secondary | ICD-10-CM | POA: Diagnosis not present

## 2020-04-09 DIAGNOSIS — G43519 Persistent migraine aura without cerebral infarction, intractable, without status migrainosus: Secondary | ICD-10-CM | POA: Insufficient documentation

## 2020-04-09 DIAGNOSIS — G894 Chronic pain syndrome: Secondary | ICD-10-CM

## 2020-04-09 DIAGNOSIS — M797 Fibromyalgia: Secondary | ICD-10-CM | POA: Insufficient documentation

## 2020-04-09 DIAGNOSIS — M5416 Radiculopathy, lumbar region: Secondary | ICD-10-CM

## 2020-04-09 DIAGNOSIS — M5412 Radiculopathy, cervical region: Secondary | ICD-10-CM

## 2020-04-09 DIAGNOSIS — M47812 Spondylosis without myelopathy or radiculopathy, cervical region: Secondary | ICD-10-CM | POA: Insufficient documentation

## 2020-04-09 DIAGNOSIS — Z5181 Encounter for therapeutic drug level monitoring: Secondary | ICD-10-CM

## 2020-04-09 DIAGNOSIS — M546 Pain in thoracic spine: Secondary | ICD-10-CM | POA: Diagnosis not present

## 2020-04-09 DIAGNOSIS — G8929 Other chronic pain: Secondary | ICD-10-CM | POA: Insufficient documentation

## 2020-04-09 DIAGNOSIS — M7918 Myalgia, other site: Secondary | ICD-10-CM | POA: Insufficient documentation

## 2020-04-09 MED ORDER — FENTANYL 12 MCG/HR TD PT72: 1.0000 | MEDICATED_PATCH | TRANSDERMAL | 0 refills | Status: DC

## 2020-04-09 MED ORDER — HYDROCODONE-ACETAMINOPHEN 5-325 MG PO TABS: 1.0000 | ORAL_TABLET | Freq: Three times a day (TID) | ORAL | 0 refills | Status: DC | PRN

## 2020-04-09 NOTE — Progress Notes (Signed)
Subjective:    Patient ID: Patty Salas, female    DOB: 1955/11/12, 64 y.o.   MRN: [id number]  HPI: Patty Salas is a 64 y.o. female whose appointment was changed to a tele-health visit, per patient request. She states she was prescribed a new medication and having side effects with urinary incontinence, she spoke with her PCP regarding the above. The medication has been discontinued she reports. She states her pain is located in her neck radiating into her bilateral shoulders, mid- lower back pain radiating into her bilateral hips and bilateral lower extremities, also reports bilateral knee pain and states she awaken with a migraine this morning she took Axert.  She rates her pain 10. Her current exercise regime is walking and performing stretching exercises.  Patty Salas Morphine equivalent is 44.02 MME. She  is also prescribed Lorazepam by Dr. Reece Levy. We have discussed the black box warning of using opioids and benzodiazepines. I highlighted the dangers of using these drugs together and discussed the adverse events including respiratory suppression, overdose, cognitive impairment and importance of compliance with current regimen. We will continue to monitor and adjust as indicated.  She  is being closely monitored and under the care of her psychiatrist Dr. Reece Levy   Last UDS was Performed on 02/04/2020. It was consistent.   Pain Inventory Average Pain 10 Pain Right Now 10 My pain is constant, sharp, burning and stabbing  In the last 24 hours, has pain interfered with the following? General activity 10 Relation with others 10 Enjoyment of life 10 What TIME of day is your pain at its worst? all Sleep (in general) Poor  Pain is worse with: walking, bending, sitting, inactivity, standing and some activites Pain improves with: rest, heat/ice, therapy/exercise, medication and injections Relief from Meds: 4  Mobility walk with assistance use a cane use a walker  Function disabled: date  disabled .  Neuro/Psych bladder control problems  Prior Studies Any changes since last visit?  no  Physicians involved in your care Any changes since last visit?  no   Family History  Problem Relation Age of Onset  . Cancer Mother        stomach  . Migraines Mother   . Arthritis Mother   . Emphysema Mother   . Heart disease Father   . Hypertension Father   . Cancer Father        colon  . Dementia Father   . Hypertension Brother   . Migraines Brother   . Hypertension Brother   . Migraines Brother   . Alcohol abuse Brother   . Bipolar disorder Brother   . Migraines Daughter   . Arthritis Maternal Grandmother   . Cancer Maternal Grandmother        stomach  . Diabetes Maternal Grandmother   . Stroke Maternal Grandmother   . Cancer Maternal Aunt        lung  . Emphysema Maternal Aunt   . Cancer Paternal Uncle        colon  . Hypertension Maternal Aunt   . Heart disease Maternal Aunt   . Cancer Paternal Uncle        lung   Social History   Socioeconomic History  . Marital status: Married    Spouse name: Not on file  . Number of children: 1  . Years of education: COLLEGE1  . Highest education level: Not on file  Occupational History  . Occupation: HOUSEWIFE    Employer: UNEMPLOYED  Tobacco  Use  . Smoking status: Never Smoker  . Smokeless tobacco: Never Used  Substance and Sexual Activity  . Alcohol use: No  . Drug use: No  . Sexual activity: Not on file  Other Topics Concern  . Not on file  Social History Narrative   Patient is right handed.   Patient drinks caffeine occasionally.   Social Determinants of Health   Financial Resource Strain:   . Difficulty of Paying Living Expenses:   Food Insecurity:   . Worried About Charity fundraiser in the Last Year:   . Arboriculturist in the Last Year:   Transportation Needs:   . Film/video editor (Medical):   Marland Kitchen Lack of Transportation (Non-Medical):   Physical Activity:   . Days of Exercise per  Week:   . Minutes of Exercise per Session:   Stress:   . Feeling of Stress :   Social Connections:   . Frequency of Communication with Friends and Family:   . Frequency of Social Gatherings with Friends and Family:   . Attends Religious Services:   . Active Member of Clubs or Organizations:   . Attends Archivist Meetings:   Marland Kitchen Marital Status:    Past Surgical History:  Procedure Laterality Date  .  kidney stones    . ABDOMINAL ADHESION SURGERY    . ABDOMINAL HYSTERECTOMY  1995   partial- hemmoraged after surgery-stitch came loose  . APPENDECTOMY  1993   hemmoraged after surgery- stitch came loose  . CERVICAL DISC SURGERY  06/04/2001   with fusion  . CHOLECYSTECTOMY  1985  . colonoscopy    . COLONOSCOPY    . CYSTOSCOPY    . FOOT SURGERY     lt  . jj stent  07/2002   with ureteroscopy, cystoscopy and then removal of stent in office  . LITHOTRIPSY    . LYMPH NODE BIOPSY Right 03/06/2014   Procedure: right groin LYMPH NODE BIOPSY;  Surgeon: Merrie Roof, MD;  Location: [facility];  Service: General;  Laterality: Right;  . LYMPHADENECTOMY N/A 12/29/2015   Procedure: RETROPERITONEAL LYMPHADENECTOMY;  Surgeon: Alexis Frock, MD;  [facility];  Service: Urology;  Laterality: N/A;  . NECK SURGERY  2002  . ROBOT ASSISTED LAPAROSCOPIC NEPHRECTOMY Left 12/29/2015   Procedure: XI ROBOTIC ASSISTED LEFT LAPAROSCOPIC NEPHRECTOMY;  Surgeon: Alexis Frock, MD;  [facility];  Service: Urology;  Laterality: Left;  . TONSILLECTOMY  1976   Past Medical History:  Diagnosis Date  . Anxiety   . Arthritis   . Asthma   . Bipolar 2 disorder ([place])    pt stated, "I have Bipolar 2 and it is remission"  . Bipolar affective ([place])   . Calcifying tendinitis of shoulder   . Carpal tunnel syndrome   . Cervical facet syndrome   . Chronic pain syndrome   . Complication of anesthesia   . Depression   . Diverticulosis   . Falls   . Fibromyalgia   . Follicular  lymphoma grade I of intrapelvic lymph nodes ([place]) 02/22/2016  . Full dentures   . Gout   . Headache(784.0)    migraines  . Herpesviral infection   . Hypertension   . IBS (irritable bowel syndrome)    with diarrhea  . Lymphadenopathy   . Myalgia and myositis, unspecified   . PONV (postoperative nausea and vomiting)   . PTSD (post-traumatic stress disorder)   . Renal calculi   . Restless  legs syndrome (RLS)   . Thoracic radiculopathy   . Vitamin D deficiency   . Wears glasses    Temp 98.8 F (37.1 C) Comment: pt reported, virtual visit  Ht 5\' 5"  (1.651 m) Comment: pt reported, virtual visit  Wt 150 lb 12.8 oz (68.4 kg) Comment: pt reported, virtual visit  BMI 25.09 kg/m   Opioid Risk Score:   Fall Risk Score:  `1  Depression screen PHQ 2/9  Depression screen [facility] 2/9 04/09/2020 02/04/2020 10/06/2019 04/30/2019 03/06/2019 01/07/2019 11/21/2018  Decreased Interest 1 1 1 1 1 1 1   Down, Depressed, Hopeless 1 1 1 1 1 1 1   PHQ - 2 Score 2 2 2 2 2 2 2   Altered sleeping - - - - - - 1  Tired, decreased energy - - - - - - 2  Change in appetite - - - - - - 1  Feeling bad or failure about yourself  - - - - - - 0  Trouble concentrating - - - - - - 1  Moving slowly or fidgety/restless - - - - - - 1  Suicidal thoughts - - - - - - 0  PHQ-9 Score - - - - - - 8  Difficult doing work/chores - - - - - - Somewhat difficult  Some recent data might be hidden    Review of Systems  Genitourinary:       Bladder control problems  Musculoskeletal: Positive for gait problem.  All other systems reviewed and are negative.      Objective:   Physical Exam Vitals and nursing note reviewed.  Musculoskeletal:     Comments: No Physical Examined Performed: Tele-Health visit           Assessment & Plan:  1.History of fibromyalgia with myofascial pain and multiple trigger points.Continue with Heat and exercise Regime. Continue withcurrent medication regimen withgabapentin.04/09/2020 2. Chronic  migraine headaches.Continue to Monitor. S/P Botox.on 05/30/2017.04/09/2020 3. Midline Low Back Pain/ Lumbar Spondylosis/Lumbar degenerative disk disease, L4-5/ Lumbar Radiculitis: Continue with HEP and current medication regimen. Refilled: Fentanyl Patch21mcg one patch every three days #10 and ContinueHydrocodone 5/325 mg one tablet every8hours as needed #70.04/09/2020. 4. 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 Urology Following.04/09/2020. 5. Right CTS: Continue to wear Wrist stabilizer.04/09/2020 6. Cervicalgia/ Cervical RadiculitisCervical Spondylosis: Continuecurrent medication regiment withGabapentin. Continue with HEP and Continue to monitor.04/09/2020 7. Muscle Spasm: Continuecurrent medication regimen withFlexeril as needed.04/09/2020 9. Bilateral Greater Trochanter Bursitis: Continue to alternate with ice and heat therapy. Continue HEP as Tolerated. Continue to monitor.04/09/2020 10. Bilateral Knee Pain: S/P Knee Injection by Dr Sharol Given on 02/23/2020. Orthopedics Following. Continue to Monitor. 04/09/2020.   F/U in 1 month Tele-Health Visit Established Patient Location of Patient: In Her Home Location of Provider: In the Office Telephone Call Total Time Spent: 20 Minutes

## 2020-04-15 ENCOUNTER — Ambulatory Visit: Payer: Medicare Other | Admitting: Physical Therapy

## 2020-05-04 ENCOUNTER — Other Ambulatory Visit: Payer: Self-pay

## 2020-05-05 ENCOUNTER — Encounter: Payer: Self-pay | Admitting: Registered Nurse

## 2020-05-05 ENCOUNTER — Other Ambulatory Visit: Payer: Self-pay

## 2020-05-05 ENCOUNTER — Encounter: Payer: Medicare Other | Attending: Physical Medicine & Rehabilitation | Admitting: Registered Nurse

## 2020-05-05 VITALS — BP 127/83 | HR 105 | Temp 98.9°F | Ht 65.0 in | Wt 150.0 lb

## 2020-05-05 DIAGNOSIS — G8929 Other chronic pain: Secondary | ICD-10-CM | POA: Diagnosis present

## 2020-05-05 DIAGNOSIS — M7918 Myalgia, other site: Secondary | ICD-10-CM | POA: Insufficient documentation

## 2020-05-05 DIAGNOSIS — Z79891 Long term (current) use of opiate analgesic: Secondary | ICD-10-CM | POA: Diagnosis present

## 2020-05-05 DIAGNOSIS — M5412 Radiculopathy, cervical region: Secondary | ICD-10-CM | POA: Diagnosis not present

## 2020-05-05 DIAGNOSIS — M546 Pain in thoracic spine: Secondary | ICD-10-CM | POA: Diagnosis present

## 2020-05-05 DIAGNOSIS — M542 Cervicalgia: Secondary | ICD-10-CM | POA: Insufficient documentation

## 2020-05-05 DIAGNOSIS — M7062 Trochanteric bursitis, left hip: Secondary | ICD-10-CM | POA: Diagnosis present

## 2020-05-05 DIAGNOSIS — M7061 Trochanteric bursitis, right hip: Secondary | ICD-10-CM

## 2020-05-05 DIAGNOSIS — G894 Chronic pain syndrome: Secondary | ICD-10-CM

## 2020-05-05 DIAGNOSIS — Z5181 Encounter for therapeutic drug level monitoring: Secondary | ICD-10-CM | POA: Diagnosis present

## 2020-05-05 DIAGNOSIS — G43519 Persistent migraine aura without cerebral infarction, intractable, without status migrainosus: Secondary | ICD-10-CM | POA: Insufficient documentation

## 2020-05-05 DIAGNOSIS — M4716 Other spondylosis with myelopathy, lumbar region: Secondary | ICD-10-CM | POA: Diagnosis present

## 2020-05-05 DIAGNOSIS — M47812 Spondylosis without myelopathy or radiculopathy, cervical region: Secondary | ICD-10-CM | POA: Insufficient documentation

## 2020-05-05 DIAGNOSIS — M5416 Radiculopathy, lumbar region: Secondary | ICD-10-CM

## 2020-05-05 DIAGNOSIS — M797 Fibromyalgia: Secondary | ICD-10-CM | POA: Insufficient documentation

## 2020-05-05 MED ORDER — FENTANYL 12 MCG/HR TD PT72: 1.0000 | MEDICATED_PATCH | TRANSDERMAL | 0 refills | Status: DC

## 2020-05-05 MED ORDER — HYDROCODONE-ACETAMINOPHEN 5-325 MG PO TABS: 1.0000 | ORAL_TABLET | Freq: Three times a day (TID) | ORAL | 0 refills | Status: DC | PRN

## 2020-05-05 NOTE — Progress Notes (Signed)
Subjective:    Patient ID: Patty Salas, female    DOB: [birth year], 64 y.o.   MRN: [id number]  HPI: Patty Salas is a 64 y.o. female who returns for follow up appointment for chronic pain and medication refill. She states her pain is located in her neck radiating into her bilateral shoulders, upper- lower back pain radiating into her bilateral hips and bilateral  lower extremities. Also reports lump in her head Right occipital region, shedenies falling, also states she has an appointment with her PCP. She rates her pain 10. Her current exercise regime is walking and performing stretching exercises.  [name] Morphine equivalent is44.02 MME.  She  is also prescribed Lorazepam  by Dr. Reece Levy. We have discussed the black box warning of using opioids and benzodiazepines. I highlighted the dangers of using these drugs together and discussed the adverse events including respiratory suppression, overdose, cognitive impairment and importance of compliance with current regimen. We will continue to monitor and adjust as indicated.  She is being closely monitored and under the care of her psychiatrist.  Last UDS was Performed on 02/04/2020, it was consistent.    Pain Inventory Average Pain 10 Pain Right Now 10 My pain is constant, sharp, burning and stabbing  In the last 24 hours, has pain interfered with the following? General activity 9 Relation with others 10 Enjoyment of life 10 What TIME of day is your pain at its worst? morning, evening, night Sleep (in general) Fair  Pain is worse with: walking, bending, sitting, inactivity, standing and some activites Pain improves with: rest, heat/ice, therapy/exercise, medication and injections Relief from Meds: 3  Mobility walk with assistance use a cane use a walker ability to climb steps?  yes do you drive?  yes Do you have any goals in this area?  yes  Function not employed: date last employed . disabled: date disabled . I need  assistance with the following:  meal prep, household duties and shopping Do you have any goals in this area?  yes  Neuro/Psych bladder control problems bowel control problems weakness numbness tremor tingling trouble walking spasms dizziness confusion depression anxiety  Prior Studies Any changes since last visit?  no  Physicians involved in your care Any changes since last visit?  no   Family History  Problem Relation Age of Onset  . Cancer Mother        stomach  . Migraines Mother   . Arthritis Mother   . Emphysema Mother   . Heart disease Father   . Hypertension Father   . Cancer Father        colon  . Dementia Father   . Hypertension Brother   . Migraines Brother   . Hypertension Brother   . Migraines Brother   . Alcohol abuse Brother   . Bipolar disorder Brother   . Migraines Daughter   . Arthritis Maternal Grandmother   . Cancer Maternal Grandmother        stomach  . Diabetes Maternal Grandmother   . Stroke Maternal Grandmother   . Cancer Maternal Aunt        lung  . Emphysema Maternal Aunt   . Cancer Paternal Uncle        colon  . Hypertension Maternal Aunt   . Heart disease Maternal Aunt   . Cancer Paternal Uncle        lung   Social History   Socioeconomic History  . Marital status: Married    Spouse  name: Not on file  . Number of children: 1  . Years of education: COLLEGE1  . Highest education level: Not on file  Occupational History  . Occupation: HOUSEWIFE    Employer: UNEMPLOYED  Tobacco Use  . Smoking status: Never Smoker  . Smokeless tobacco: Never Used  Substance and Sexual Activity  . Alcohol use: No  . Drug use: No  . Sexual activity: Not on file  Other Topics Concern  . Not on file  Social History Narrative   Patient is right handed.   Patient drinks caffeine occasionally.   Social Determinants of Health   Financial Resource Strain:   . Difficulty of Paying Living Expenses:   Food Insecurity:   . Worried About  Charity fundraiser in the Last Year:   . Arboriculturist in the Last Year:   Transportation Needs:   . Film/video editor (Medical):   Marland Kitchen Lack of Transportation (Non-Medical):   Physical Activity:   . Days of Exercise per Week:   . Minutes of Exercise per Session:   Stress:   . Feeling of Stress :   Social Connections:   . Frequency of Communication with Friends and Family:   . Frequency of Social Gatherings with Friends and Family:   . Attends Religious Services:   . Active Member of Clubs or Organizations:   . Attends Archivist Meetings:   Marland Kitchen Marital Status:    Past Surgical History:  Procedure Laterality Date  .  kidney stones    . ABDOMINAL ADHESION SURGERY    . ABDOMINAL HYSTERECTOMY  1995   partial- hemmoraged after surgery-stitch came loose  . APPENDECTOMY  1993   hemmoraged after surgery- stitch came loose  . CERVICAL DISC SURGERY  06/04/2001   with fusion  . CHOLECYSTECTOMY  1985  . colonoscopy    . COLONOSCOPY    . CYSTOSCOPY    . FOOT SURGERY     lt  . jj stent  07/2002   with ureteroscopy, cystoscopy and then removal of stent in office  . LITHOTRIPSY    . LYMPH NODE BIOPSY Right 03/06/2014   Procedure: right groin LYMPH NODE BIOPSY;  Surgeon: Merrie Roof, MD;  Location: [place];  Service: General;  Laterality: Right;  . LYMPHADENECTOMY N/A 12/29/2015   Procedure: RETROPERITONEAL LYMPHADENECTOMY;  Surgeon: Alexis Frock, MD;  Location: [facility];  Service: Urology;  Laterality: N/A;  . NECK SURGERY  2002  . ROBOT ASSISTED LAPAROSCOPIC NEPHRECTOMY Left 12/29/2015   Procedure: XI ROBOTIC ASSISTED LEFT LAPAROSCOPIC NEPHRECTOMY;  Surgeon: Alexis Frock, MD;  Location: [facility];  Service: Urology;  Laterality: Left;  . TONSILLECTOMY  1976   Past Medical History:  Diagnosis Date  . Anxiety   . Arthritis   . Asthma   . Bipolar 2 disorder ([place])    pt stated, "I have Bipolar 2 and it is remission"  . Bipolar affective ([place])   .  Calcifying tendinitis of shoulder   . Carpal tunnel syndrome   . Cervical facet syndrome   . Chronic pain syndrome   . Complication of anesthesia   . Depression   . Diverticulosis   . Falls   . Fibromyalgia   . 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 Full dentures   . Gout   . Headache(784.0)    migraines  . Herpesviral infection   . Hypertension   . IBS (irritable bowel syndrome)  with diarrhea  . Lymphadenopathy   . Myalgia and myositis, unspecified   . PONV (postoperative nausea and vomiting)   . PTSD (post-traumatic stress disorder)   . Renal calculi   . Restless legs syndrome (RLS)   . Thoracic radiculopathy   . Vitamin D deficiency   . Wears glasses    BP 127/83   Pulse (!) 105   Temp 98.9 F (37.2 C)   Ht 5\' 5"  (1.651 m)   Wt 150 lb (68 kg)   SpO2 95%   BMI 24.96 kg/m   Opioid Risk Score:   Fall Risk Score:  `1  Depression screen PHQ 2/9  Depression screen [facility] 2/9 04/09/2020 02/04/2020 10/06/2019 04/30/2019 03/06/2019 01/07/2019 11/21/2018  Decreased Interest 1 1 1 1 1 1 1   Down, Depressed, Hopeless 1 1 1 1 1 1 1   PHQ - 2 Score 2 2 2 2 2 2 2   Altered sleeping - - - - - - 1  Tired, decreased energy - - - - - - 2  Change in appetite - - - - - - 1  Feeling bad or failure about yourself  - - - - - - 0  Trouble concentrating - - - - - - 1  Moving slowly or fidgety/restless - - - - - - 1  Suicidal thoughts - - - - - - 0  PHQ-9 Score - - - - - - 8  Difficult doing work/chores - - - - - - Somewhat difficult  Some recent data might be hidden    Review of Systems  Constitutional: Negative.   HENT: Negative.   Eyes: Negative.   Respiratory: Negative.   Cardiovascular: Positive for leg swelling.  Gastrointestinal: Positive for abdominal pain, diarrhea, nausea and vomiting.  Endocrine: Negative.   Genitourinary: Positive for difficulty urinating.  Musculoskeletal: Positive for arthralgias, back pain, gait problem, neck pain and  neck stiffness.  Allergic/Immunologic: Negative.   Neurological: Positive for dizziness, tremors, weakness and numbness.       Tingling   Psychiatric/Behavioral: Positive for confusion and dysphoric mood. The patient is nervous/anxious.   All other systems reviewed and are negative.      Objective:   Physical Exam Vitals and nursing note reviewed.  Constitutional:      Appearance: Normal appearance.  Cardiovascular:     Rate and Rhythm: Normal rate and regular rhythm.     Pulses: Normal pulses.     Heart sounds: Normal heart sounds.  Pulmonary:     Effort: Pulmonary effort is normal.     Breath sounds: Normal breath sounds.  Musculoskeletal:     Cervical back: Normal range of motion and neck supple.     Comments: Normal Muscle Bulk and Muscle Testing Reveals:  Upper Extremities: Full ROM and Muscle Strength 5/5 Bilateral AC Joint Tenderness  Thoracic Paraspinal Tenderness: T-7-T-9 Lumbar Paraspinal Tenderness: L-3-L-5 Lower Extremities: Full ROM and Muscle Strength 5/5 Arises from Table slowly using cane for support Narrow Based  Gait   Skin:    General: Skin is warm.  Neurological:     Mental Status: She is alert and oriented to person, place, and time.  Psychiatric:        Mood and Affect: Mood normal.        Behavior: Behavior normal.           Assessment & Plan:  1.History of fibromyalgia with myofascial pain and multiple trigger points.Continue with Heat and exercise Regime. Continue withcurrent medication regimen withgabapentin.05/05/2020  2. Chronic migraine headaches.Continue to Monitor. S/P Botox.on 05/30/2017.05/05/2020 3. Midline Low Back Pain/ Lumbar Spondylosis/Lumbar degenerative disk disease, L4-5/ Lumbar Radiculitis: Continue with HEP and current medication regimen. Refilled: Fentanyl Patch6mcg one patch every three days #10 and ContinueHydrocodone 5/325 mg one tablet every8hours as needed #70.05/05/2020. 4. History of Left Renal Mass:  S/P Left Laparoscopic Nephrectomy 02/24/2016. Urology Following.05/05/2020. 5. Right CTS: Continue to wear Wrist stabilizer.05/05/2020 6. Cervicalgia/ Cervical RadiculitisCervical Spondylosis: Continuecurrent medication regiment withGabapentin. Continue with HEP and Continue to monitor.05/05/2020 7. Muscle Spasm: Continuecurrent medication regimen withFlexeril as needed.05/05/2020 9. Bilateral Greater Trochanter Bursitis: Continue to alternate with ice and heat therapy. Continue HEP as Tolerated. Continue to monitor.05/05/2020 10. Bilateral Knee Pain: S/P Knee Injection by Dr Sharol Given on 02/23/2020. Orthopedics Following. Continue to Monitor. 05/05/2020   F/U in 1 month  49minutes of face to face patient care time was spent during this visit. All questions were encouraged and answered.

## 2020-05-30 ENCOUNTER — Other Ambulatory Visit: Payer: Self-pay

## 2020-06-02 ENCOUNTER — Encounter: Payer: Medicare Other | Admitting: Physical Medicine & Rehabilitation

## 2020-06-03 ENCOUNTER — Other Ambulatory Visit: Payer: Self-pay

## 2020-06-03 NOTE — Telephone Encounter (Signed)
Refill Fentanyl and Hydrocodone-APAP

## 2020-06-04 ENCOUNTER — Telehealth: Payer: Self-pay | Admitting: Registered Nurse

## 2020-06-04 ENCOUNTER — Encounter: Payer: Medicare Other | Admitting: Registered Nurse

## 2020-06-04 ENCOUNTER — Other Ambulatory Visit: Payer: Self-pay

## 2020-06-04 MED ORDER — FENTANYL 12 MCG/HR TD PT72
1.0000 | MEDICATED_PATCH | TRANSDERMAL | 0 refills | Status: DC
Start: 2020-06-04 — End: 2020-07-06

## 2020-06-04 MED ORDER — HYDROCODONE-ACETAMINOPHEN 5-325 MG PO TABS
1.0000 | ORAL_TABLET | Freq: Three times a day (TID) | ORAL | 0 refills | Status: DC | PRN
Start: 2020-06-04 — End: 2020-07-06

## 2020-06-04 NOTE — Telephone Encounter (Signed)
Patty Salas arrived to office febrile, she has an appointment with Dr Naaman Plummer next week. PMP was reviewed and Medications e- scribed.

## 2020-06-26 ENCOUNTER — Other Ambulatory Visit: Payer: Self-pay | Admitting: Neurology

## 2020-06-28 ENCOUNTER — Telehealth: Payer: Self-pay

## 2020-06-28 NOTE — Telephone Encounter (Signed)
Patty Salas has a pending cysto and biopsy by Dr. Tresa Moore on 07/20/2020. She also has kidney stones. Patient wanted to confirm that the medication used for her trigger points on 06/30/2020,  will not cause additional bleeding. Or any complications for her pending procedure with Dr. Tresa Moore.

## 2020-06-29 NOTE — Telephone Encounter (Signed)
No complications whatsoever

## 2020-06-29 NOTE — Telephone Encounter (Signed)
Alber[place] notified.

## 2020-06-30 ENCOUNTER — Telehealth: Payer: Self-pay | Admitting: *Deleted

## 2020-06-30 ENCOUNTER — Encounter: Payer: Medicare Other | Admitting: Physical Medicine & Rehabilitation

## 2020-06-30 NOTE — Telephone Encounter (Signed)
Return Patty Salas call, no answer. Left message to return the call.

## 2020-06-30 NOTE — Telephone Encounter (Signed)
Patty Salas called x 3 about her condition and was tearful.   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  She is hurting across her back and has been in much more pain lately.  She wants to stick with her plan of weaning off some of her medicine but for this one cycle she is asking to go back up to 25 mcg on her fentanyl and a dispense of #90 on the norco.  She has been in so much pain and she said [name] said to call if she was having problems.  She has placed one of her patches today which leave her one left, and she has 28.5 norco tablets (she has been cutting in half) Her appointment is with [name] on 07/06/20, so this was an [place]

## 2020-07-06 ENCOUNTER — Telehealth: Payer: Self-pay

## 2020-07-06 ENCOUNTER — Encounter: Payer: Medicare Other | Attending: Physical Medicine & Rehabilitation | Admitting: Registered Nurse

## 2020-07-06 ENCOUNTER — Ambulatory Visit: Payer: No Typology Code available for payment source | Admitting: Registered Nurse

## 2020-07-06 ENCOUNTER — Other Ambulatory Visit: Payer: Self-pay

## 2020-07-06 ENCOUNTER — Encounter: Payer: Self-pay | Admitting: Registered Nurse

## 2020-07-06 ENCOUNTER — Telehealth: Payer: Self-pay | Admitting: Physician Assistant

## 2020-07-06 ENCOUNTER — Other Ambulatory Visit: Payer: Self-pay | Admitting: Physician Assistant

## 2020-07-06 VITALS — BP 130/73 | HR 94 | Ht 65.0 in | Wt 153.4 lb

## 2020-07-06 DIAGNOSIS — M47812 Spondylosis without myelopathy or radiculopathy, cervical region: Secondary | ICD-10-CM | POA: Diagnosis not present

## 2020-07-06 DIAGNOSIS — C8206 Follicular lymphoma grade I, intrapelvic lymph nodes: Secondary | ICD-10-CM

## 2020-07-06 DIAGNOSIS — M5412 Radiculopathy, cervical region: Secondary | ICD-10-CM | POA: Diagnosis not present

## 2020-07-06 DIAGNOSIS — G8929 Other chronic pain: Secondary | ICD-10-CM | POA: Insufficient documentation

## 2020-07-06 DIAGNOSIS — G894 Chronic pain syndrome: Secondary | ICD-10-CM | POA: Insufficient documentation

## 2020-07-06 DIAGNOSIS — M797 Fibromyalgia: Secondary | ICD-10-CM | POA: Insufficient documentation

## 2020-07-06 DIAGNOSIS — Z5181 Encounter for therapeutic drug level monitoring: Secondary | ICD-10-CM | POA: Insufficient documentation

## 2020-07-06 DIAGNOSIS — M542 Cervicalgia: Secondary | ICD-10-CM | POA: Diagnosis not present

## 2020-07-06 DIAGNOSIS — M7061 Trochanteric bursitis, right hip: Secondary | ICD-10-CM | POA: Insufficient documentation

## 2020-07-06 DIAGNOSIS — M5416 Radiculopathy, lumbar region: Secondary | ICD-10-CM | POA: Insufficient documentation

## 2020-07-06 DIAGNOSIS — M7062 Trochanteric bursitis, left hip: Secondary | ICD-10-CM | POA: Insufficient documentation

## 2020-07-06 DIAGNOSIS — M4716 Other spondylosis with myelopathy, lumbar region: Secondary | ICD-10-CM | POA: Insufficient documentation

## 2020-07-06 DIAGNOSIS — M546 Pain in thoracic spine: Secondary | ICD-10-CM | POA: Insufficient documentation

## 2020-07-06 DIAGNOSIS — Z79891 Long term (current) use of opiate analgesic: Secondary | ICD-10-CM | POA: Insufficient documentation

## 2020-07-06 MED ORDER — FENTANYL 12 MCG/HR TD PT72
1.0000 | MEDICATED_PATCH | TRANSDERMAL | 0 refills | Status: DC
Start: 2020-07-06 — End: 2020-08-05

## 2020-07-06 MED ORDER — HYDROCODONE-ACETAMINOPHEN 5-325 MG PO TABS
1.0000 | ORAL_TABLET | Freq: Three times a day (TID) | ORAL | 0 refills | Status: DC | PRN
Start: 2020-07-06 — End: 2020-08-05

## 2020-07-06 NOTE — Telephone Encounter (Signed)
Pt called stating she spoke with PCP who sent referral to our office today. As of now there is no referral present. Pt states she will f/u as she is eager to have an appt for [place] to "check a place on her head." Pt states she may also need to be tested for flu/COVID before scheduling appt with [place] as she has not been feeling well, and goes on to say that PCP can not see her until November, so she is going to Urgent Care tomorrow. Informed pt we would keep a look out for her referral and be in touch with her for an appt once we receive it. Pt verbalized understanding.

## 2020-07-06 NOTE — Telephone Encounter (Signed)
Mrs. Eblen said she talked with you today. And was advised to call her PCP Dr. Mariel Kansky office. She did talk with the office and will go to Urgent Care tomorrow.   Call back ph# [phone].

## 2020-07-06 NOTE — Progress Notes (Addendum)
Subjective:    Patient ID: Patty Salas, female    DOB: 04/19/1956, 64 y.o.   MRN: [id number]  HPI: Patty Salas is a 64 y.o. female whose appointment was changed to a My-Chart Video Visit, Patty Salas called stating she was having nausea, vomiting and diarrhea. Patty Salas agrees with My-Chart Video Visit and she was instructed to call her PCP today regarding her above symptoms, she verbalizes understanding. She states her pain is located in her neck radiating into her bilateral shoulders, mid- lower back pain radiating into her bilateral lower extremities and bilateral hip pain. Also reports her pain s not being controlled with current medication regimen, Hydrocodone increaed to every 8 hours as needed for pain. She verbalizes understanding.She  Rates her  Pain 9. Her  current exercise regime is walking and performing stretching exercises.  Patty Salas CMA asked the Health and History Questions. This provider and Kennon Rounds verified we were speaking with the correct person using two identifiers.   Patty Salas Morphine equivalent is 43.38  MME.She is also prescribed Lorazepam  by Patty Salas. We have discussed the black box warning of using opioids and benzodiazepines. I highlighted the dangers of using these drugs together and discussed the adverse events including respiratory suppression, overdose, cognitive impairment and importance of compliance with current regimen. We will continue to monitor and adjust as indicated.  She  is being closely monitored and under the care of her psychiatrist Patty Salas.     Last UDS was Performed on 02/04/2020, it was consistent.    Pain Inventory Average Pain 8 Pain Right Now 9 My pain is constant, sharp, burning and stabbing  In the last 24 hours, has pain interfered with the following? General activity 9 Relation with others 9 Enjoyment of life 9 What TIME of day is your pain at its worst? morning , evening and night Sleep (in general) Fair  Pain is worse  with: walking, bending, sitting, inactivity, standing and some activites Pain improves with: rest, heat/ice, therapy/exercise, medication and injections Relief from Meds: 5  Family History  Problem Relation Age of Onset  . Cancer Mother        stomach  . Migraines Mother   . Arthritis Mother   . Emphysema Mother   . Heart disease Father   . Hypertension Father   . Cancer Father        colon  . Dementia Father   . Hypertension Brother   . Migraines Brother   . Hypertension Brother   . Migraines Brother   . Alcohol abuse Brother   . Bipolar disorder Brother   . Migraines Daughter   . Arthritis Maternal Grandmother   . Cancer Maternal Grandmother        stomach  . Diabetes Maternal Grandmother   . Stroke Maternal Grandmother   . Cancer Maternal Aunt        lung  . Emphysema Maternal Aunt   . Cancer Paternal Uncle        colon  . Hypertension Maternal Aunt   . Heart disease Maternal Aunt   . Cancer Paternal Uncle        lung   Social History   Socioeconomic History  . Marital status: Married    Spouse name: Not on file  . Number of children: 1  . Years of education: COLLEGE1  . Highest education level: Not on file  Occupational History  . Occupation: HOUSEWIFE    Employer: UNEMPLOYED  Tobacco Use  .  Smoking status: Never Smoker  . Smokeless tobacco: Never Used  Substance and Sexual Activity  . Alcohol use: No  . Drug use: No  . Sexual activity: Not on file  Other Topics Concern  . Not on file  Social History Narrative   Patient is right handed.   Patient drinks caffeine occasionally.   Social Determinants of Health   Financial Resource Strain:   . Difficulty of Paying Living Expenses: Not on file  Food Insecurity:   . Worried About Charity fundraiser in the Last Year: Not on file  . Ran Out of Food in the Last Year: Not on file  Transportation Needs:   . Lack of Transportation (Medical): Not on file  . Lack of Transportation (Non-Medical): Not on  file  Physical Activity:   . Days of Exercise per Week: Not on file  . Minutes of Exercise per Session: Not on file  Stress:   . Feeling of Stress : Not on file  Social Connections:   . Frequency of Communication with Friends and Family: Not on file  . Frequency of Social Gatherings with Friends and Family: Not on file  . Attends Religious Services: Not on file  . Active Member of Clubs or Organizations: Not on file  . Attends Archivist Meetings: Not on file  . Marital Status: Not on file   Past Surgical History:  Procedure Laterality Date  .  kidney stones    . ABDOMINAL ADHESION SURGERY    . ABDOMINAL HYSTERECTOMY  1995   partial- hemmoraged after surgery-stitch came loose  . APPENDECTOMY  1993   hemmoraged after surgery- stitch came loose  . CERVICAL DISC SURGERY  06/04/2001   with fusion  . CHOLECYSTECTOMY  1985  . colonoscopy    . COLONOSCOPY    . CYSTOSCOPY    . FOOT SURGERY     lt  . jj stent  07/2002   with ureteroscopy, cystoscopy and then removal of stent in office  . LITHOTRIPSY    . LYMPH NODE BIOPSY Right 03/06/2014   Procedure: right groin LYMPH NODE BIOPSY;  Surgeon: Merrie Roof, MD;  Location: [place];  Service: General;  Laterality: Right;  . LYMPHADENECTOMY N/A 12/29/2015   Procedure: RETROPERITONEAL LYMPHADENECTOMY;  Surgeon: Alexis Frock, MD;  Location[facility];  Service: Urology;  Laterality: N/A;  . NECK SURGERY  2002  . ROBOT ASSISTED LAPAROSCOPIC NEPHRECTOMY Left 12/29/2015   Procedure: XI ROBOTIC ASSISTED LEFT LAPAROSCOPIC NEPHRECTOMY;  Surgeon: Alexis Frock, MD;  Location[facility];  Service: Urology;  Laterality: Left;  . TONSILLECTOMY  1976   Past Surgical History:  Procedure Laterality Date  .  kidney stones    . ABDOMINAL ADHESION SURGERY    . ABDOMINAL HYSTERECTOMY  1995   partial- hemmoraged after surgery-stitch came loose  . APPENDECTOMY  1993   hemmoraged after surgery- stitch came loose  . CERVICAL DISC  SURGERY  06/04/2001   with fusion  . CHOLECYSTECTOMY  1985  . colonoscopy    . COLONOSCOPY    . CYSTOSCOPY    . FOOT SURGERY     lt  . jj stent  07/2002   with ureteroscopy, cystoscopy and then removal of stent in office  . LITHOTRIPSY    . LYMPH NODE BIOPSY Right 03/06/2014   Procedure: right groin LYMPH NODE BIOPSY;  Surgeon: Merrie Roof, MD;  Location: [place];  Service: General;  Laterality: Right;  .  LYMPHADENECTOMY N/A 12/29/2015   Procedure: RETROPERITONEAL LYMPHADENECTOMY;  Surgeon: Alexis Frock, MD;  [facility];  Service: Urology;  Laterality: N/A;  . NECK SURGERY  2002  . ROBOT ASSISTED LAPAROSCOPIC NEPHRECTOMY Left 12/29/2015   Procedure: XI ROBOTIC ASSISTED LEFT LAPAROSCOPIC NEPHRECTOMY;  Surgeon: Alexis Frock, MD;  [facility];  Service: Urology;  Laterality: Left;  . TONSILLECTOMY  1976   Past Medical History:  Diagnosis Date  . Anxiety   . Arthritis   . Asthma   . Bipolar 2 disorder ([place])    pt stated, "I have Bipolar 2 and it is remission"  . Bipolar affective ([place])   . Calcifying tendinitis of shoulder   . Carpal tunnel syndrome   . Cervical facet syndrome   . Chronic pain syndrome   . Complication of anesthesia   . Depression   . Diverticulosis   . Falls   . Fibromyalgia   . 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 Full dentures   . Gout   . Headache(784.0)    migraines  . Herpesviral infection   . Hypertension   . IBS (irritable bowel syndrome)    with diarrhea  . Lymphadenopathy   . Myalgia and myositis, unspecified   . PONV (postoperative nausea and vomiting)   . PTSD (post-traumatic stress disorder)   . Renal calculi   . Restless legs syndrome (RLS)   . Thoracic radiculopathy   . Vitamin D deficiency   . Wears glasses    BP 130/73 Comment: Patient reports  Pulse 94 Comment: Patient Reports  Ht 5\' 5"  (1.651 m) Comment: patient reports  Wt 153 lb 6.4 oz (69.6 kg) Comment:  patient reports  BMI 25.53 kg/m   Opioid Risk Score:   Fall Risk Score:  `1  Depression screen PHQ 2/9  Depression screen [facility] 2/9 04/09/2020 02/04/2020 10/06/2019 04/30/2019 03/06/2019 01/07/2019 11/21/2018  Decreased Interest 1 1 1 1 1 1 1   Down, Depressed, Hopeless 1 1 1 1 1 1 1   PHQ - 2 Score 2 2 2 2 2 2 2   Altered sleeping - - - - - - 1  Tired, decreased energy - - - - - - 2  Change in appetite - - - - - - 1  Feeling bad or failure about yourself  - - - - - - 0  Trouble concentrating - - - - - - 1  Moving slowly or fidgety/restless - - - - - - 1  Suicidal thoughts - - - - - - 0  PHQ-9 Score - - - - - - 8  Difficult doing work/chores - - - - - - Somewhat difficult  Some recent data might be hidden    Review of Systems  Musculoskeletal: Positive for gait problem.       Shoulder pain Leg pain   Neurological: Positive for weakness and numbness.  All other systems reviewed and are negative.      Objective:   Physical Exam Vitals and nursing note reviewed.  Musculoskeletal:     Comments: No Physical Exam Performed: My-Chart Video Appointment           Assessment & Plan:  1.History of fibromyalgia with myofascial pain and multiple trigger points.Continue with Heat and exercise Regime. Continue withcurrent medication regimen withgabapentin.07/06/2020 2. Chronic migraine headaches.Continue to Monitor. S/P Botox.on 05/30/2017.07/06/2020 3. Midline Low Back Pain/ Lumbar Spondylosis/Lumbar degenerative disk disease, L4-5/ Lumbar Radiculitis: Continue with HEP and current medication regimen. Refilled: Fentanyl  Patch36mcg one patch every three days #10 and ContinueHydrocodone 5/325 mg one tablet every8hours as needed #70.07/06/2020. 4. 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 Urology Following.07/06/2020. 5. Right CTS: Continue to wear Wrist stabilizer.07/06/2020 6. Cervicalgia/ Cervical RadiculitisCervical Spondylosis: Continuecurrent  medication regiment withGabapentin. Continue with HEP and Continue to monitor.07/06/2020 7. Muscle Spasm: Continuecurrent medication regimen withFlexeril as needed.07/06/2020 9. Bilateral Greater Trochanter Bursitis: Continue to alternate with ice and heat therapy. Continue HEP as Tolerated. Continue to monitor.07/06/2020 10. Bilateral Knee Pain: S/P Knee Injection by Patty Sharol Given on 02/23/2020. Orthopedics Following.Continue to Monitor. 07/06/2020   F/U in 1 month  My-Chart Video Established Patient Location of Patient: In Her Home Location of Provider in the Office

## 2020-07-06 NOTE — Telephone Encounter (Signed)
Referral has been placed. AS, CMA 

## 2020-07-06 NOTE — Telephone Encounter (Signed)
Patient needs a referral from her primary care doctor to cancer center next to [place].

## 2020-07-09 ENCOUNTER — Telehealth: Payer: Self-pay | Admitting: Hematology and Oncology

## 2020-07-09 NOTE — Telephone Encounter (Signed)
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 [name] has been cld and scheduled to see Dr. Alvy Bimler on 11/9 at 1pm. Pt aware to arrive 30 minutes early.

## 2020-08-02 ENCOUNTER — Telehealth: Payer: Self-pay

## 2020-08-02 NOTE — Telephone Encounter (Signed)
Patient called stating she is going to get a cystoscopy at [facility]. Also her Fentanyl patch didn't stick so she put 2 band aids and tape to hold it.

## 2020-08-03 NOTE — Telephone Encounter (Signed)
Return Patty Salas call, regarding her Fentanyl patches. She was instructed not to put band-aid on top of her fentanyl patches, she verbalizes understanding. She was instructed to take the current  Patch off and cleaned her arm off, and apply new patch in the morning, she verbalizes understanding.

## 2020-08-04 ENCOUNTER — Other Ambulatory Visit: Payer: Self-pay | Admitting: Registered Nurse

## 2020-08-04 ENCOUNTER — Other Ambulatory Visit: Payer: Self-pay

## 2020-08-04 ENCOUNTER — Other Ambulatory Visit: Payer: Self-pay | Admitting: Neurology

## 2020-08-04 DIAGNOSIS — M797 Fibromyalgia: Secondary | ICD-10-CM

## 2020-08-05 ENCOUNTER — Encounter: Payer: Self-pay | Admitting: Registered Nurse

## 2020-08-05 ENCOUNTER — Other Ambulatory Visit: Payer: Self-pay

## 2020-08-05 ENCOUNTER — Encounter: Payer: Medicare Other | Attending: Physical Medicine & Rehabilitation | Admitting: Registered Nurse

## 2020-08-05 VITALS — BP 155/89 | HR 106 | Temp 98.9°F | Ht 65.0 in | Wt 159.8 lb

## 2020-08-05 DIAGNOSIS — G8929 Other chronic pain: Secondary | ICD-10-CM

## 2020-08-05 DIAGNOSIS — M546 Pain in thoracic spine: Secondary | ICD-10-CM | POA: Diagnosis present

## 2020-08-05 DIAGNOSIS — M7061 Trochanteric bursitis, right hip: Secondary | ICD-10-CM | POA: Diagnosis present

## 2020-08-05 DIAGNOSIS — M4716 Other spondylosis with myelopathy, lumbar region: Secondary | ICD-10-CM

## 2020-08-05 DIAGNOSIS — M7062 Trochanteric bursitis, left hip: Secondary | ICD-10-CM | POA: Diagnosis present

## 2020-08-05 DIAGNOSIS — M5412 Radiculopathy, cervical region: Secondary | ICD-10-CM

## 2020-08-05 DIAGNOSIS — M5416 Radiculopathy, lumbar region: Secondary | ICD-10-CM

## 2020-08-05 DIAGNOSIS — Z5181 Encounter for therapeutic drug level monitoring: Secondary | ICD-10-CM

## 2020-08-05 DIAGNOSIS — M797 Fibromyalgia: Secondary | ICD-10-CM

## 2020-08-05 DIAGNOSIS — G894 Chronic pain syndrome: Secondary | ICD-10-CM

## 2020-08-05 DIAGNOSIS — M542 Cervicalgia: Secondary | ICD-10-CM | POA: Diagnosis present

## 2020-08-05 DIAGNOSIS — M47812 Spondylosis without myelopathy or radiculopathy, cervical region: Secondary | ICD-10-CM

## 2020-08-05 DIAGNOSIS — Z79891 Long term (current) use of opiate analgesic: Secondary | ICD-10-CM

## 2020-08-05 MED ORDER — FENTANYL 12 MCG/HR TD PT72: 1.0000 | MEDICATED_PATCH | TRANSDERMAL | 0 refills | Status: DC

## 2020-08-05 MED ORDER — CYCLOBENZAPRINE HCL 10 MG PO TABS: 10.0000 mg | ORAL_TABLET | Freq: Three times a day (TID) | ORAL | 4 refills | Status: DC

## 2020-08-05 MED ORDER — HYDROCODONE-ACETAMINOPHEN 5-325 MG PO TABS: 1.0000 | ORAL_TABLET | Freq: Three times a day (TID) | ORAL | 0 refills | Status: DC | PRN

## 2020-08-05 NOTE — Progress Notes (Signed)
Subjective:    Patient ID: Patty Salas, female    DOB: July 11, 1956, 64 y.o.   MRN: [id number]  HPI: Patty Salas is a 64 y.o. female who returns for follow up appointment for chronic pain and medication refill. She states her pain is located in her neck radiating into her bilateral shoulders, mid- lower back pain radiating into his bilateral lower extremities and bilateral hip pain. She rates her pain 9. Her current exercise regime is walking and performing stretching exercises.  Ms. Koenen Morphine equivalent is 43.80  MME. She  is also prescribed Lorazepam by Dr. Fay Records. We have discussed the black box warning of using opioids and benzodiazepines. I highlighted the dangers of using these drugs together and discussed the adverse events including respiratory suppression, overdose, cognitive impairment and importance of compliance with current regimen. We will continue to monitor and adjust as indicated.  She is being closely monitored and under the care of her psychiatrist.   UDS ordered today.  Pain Inventory Average Pain 9 Pain Right Now 9 My pain is constant, sharp and burning  In the last 24 hours, has pain interfered with the following? General activity 9 Relation with others 8 Enjoyment of life 9 What TIME of day is your pain at its worst? morning , daytime, evening and night Sleep (in general) Fair  Pain is worse with: bending, sitting, inactivity and standing Pain improves with: rest, heat/ice, therapy/exercise, medication and injections Relief from Meds: 6  Family History  Problem Relation Age of Onset  . Cancer Mother        stomach  . Migraines Mother   . Arthritis Mother   . Emphysema Mother   . Heart disease Father   . Hypertension Father   . Cancer Father        colon  . Dementia Father   . Hypertension Brother   . Migraines Brother   . Hypertension Brother   . Migraines Brother   . Alcohol abuse Brother   . Bipolar disorder Brother   . Migraines Daughter    . Arthritis Maternal Grandmother   . Cancer Maternal Grandmother        stomach  . Diabetes Maternal Grandmother   . Stroke Maternal Grandmother   . Cancer Maternal Aunt        lung  . Emphysema Maternal Aunt   . Cancer Paternal Uncle        colon  . Hypertension Maternal Aunt   . Heart disease Maternal Aunt   . Cancer Paternal Uncle        lung   Social History   Socioeconomic History  . Marital status: Married    Spouse name: Not on file  . Number of children: 1  . Years of education: COLLEGE1  . Highest education level: Not on file  Occupational History  . Occupation: HOUSEWIFE    Employer: UNEMPLOYED  Tobacco Use  . Smoking status: Never Smoker  . Smokeless tobacco: Never Used  Substance and Sexual Activity  . Alcohol use: No  . Drug use: No  . Sexual activity: Not on file  Other Topics Concern  . Not on file  Social History Narrative   Patient is right handed.   Patient drinks caffeine occasionally.   Social Determinants of Health   Financial Resource Strain:   . Difficulty of Paying Living Expenses: Not on file  Food Insecurity:   . Worried About Charity fundraiser in the Last Year: Not on  file  . [place] in the Last Year: Not on file  Transportation Needs:   . Lack of Transportation (Medical): Not on file  . Lack of Transportation (Non-Medical): Not on file  Physical Activity:   . Days of Exercise per Week: Not on file  . Minutes of Exercise per Session: Not on file  Stress:   . Feeling of Stress : Not on file  Social Connections:   . Frequency of Communication with Friends and Family: Not on file  . Frequency of Social Gatherings with Friends and Family: Not on file  . Attends Religious Services: Not on file  . Active Member of Clubs or Organizations: Not on file  . Attends Archivist Meetings: Not on file  . Marital Status: Not on file   Past Surgical History:  Procedure Laterality Date  .  kidney stones    . ABDOMINAL  ADHESION SURGERY    . ABDOMINAL HYSTERECTOMY  1995   partial- hemmoraged after surgery-stitch came loose  . APPENDECTOMY  1993   hemmoraged after surgery- stitch came loose  . CERVICAL DISC SURGERY  06/04/2001   with fusion  . CHOLECYSTECTOMY  1985  . colonoscopy    . COLONOSCOPY    . CYSTOSCOPY    . FOOT SURGERY     lt  . jj stent  07/2002   with ureteroscopy, cystoscopy and then removal of stent in office  . LITHOTRIPSY    . LYMPH NODE BIOPSY Right 03/06/2014   Procedure: right groin LYMPH NODE BIOPSY;  Surgeon: Merrie Roof, MD;  Location: [facility];  Service: General;  Laterality: Right;  . LYMPHADENECTOMY N/A 12/29/2015   Procedure: RETROPERITONEAL LYMPHADENECTOMY;  Surgeon: Alexis Frock, MD;  Location: [facility];  Service: Urology;  Laterality: N/A;  . NECK SURGERY  2002  . ROBOT ASSISTED LAPAROSCOPIC NEPHRECTOMY Left 12/29/2015   Procedure: XI ROBOTIC ASSISTED LEFT LAPAROSCOPIC NEPHRECTOMY;  Surgeon: Alexis Frock, MD;  Location: [facility];  Service: Urology;  Laterality: Left;  . TONSILLECTOMY  1976   Past Surgical History:  Procedure Laterality Date  .  kidney stones    . ABDOMINAL ADHESION SURGERY    . ABDOMINAL HYSTERECTOMY  1995   partial- hemmoraged after surgery-stitch came loose  . APPENDECTOMY  1993   hemmoraged after surgery- stitch came loose  . CERVICAL DISC SURGERY  06/04/2001   with fusion  . CHOLECYSTECTOMY  1985  . colonoscopy    . COLONOSCOPY    . CYSTOSCOPY    . FOOT SURGERY     lt  . jj stent  07/2002   with ureteroscopy, cystoscopy and then removal of stent in office  . LITHOTRIPSY    . LYMPH NODE BIOPSY Right 03/06/2014   Procedure: right groin LYMPH NODE BIOPSY;  Surgeon: Merrie Roof, MD;  Location: [facility];  Service: General;  Laterality: Right;  . LYMPHADENECTOMY N/A 12/29/2015   Procedure: RETROPERITONEAL LYMPHADENECTOMY;  Surgeon: Alexis Frock, MD;  Location: [facility];  Service: Urology;  Laterality: N/A;    . NECK SURGERY  2002  . ROBOT ASSISTED LAPAROSCOPIC NEPHRECTOMY Left 12/29/2015   Procedure: XI ROBOTIC ASSISTED LEFT LAPAROSCOPIC NEPHRECTOMY;  Surgeon: Alexis Frock, MD;  Location: [facility];  Service: Urology;  Laterality: Left;  . TONSILLECTOMY  1976   Past Medical History:  Diagnosis Date  . Anxiety   . Arthritis   . Asthma   . Bipolar 2 disorder ([place])    pt  stated, "I have Bipolar 2 and it is remission"  . Bipolar affective ([place])   . Calcifying tendinitis of shoulder   . Carpal tunnel syndrome   . Cervical facet syndrome   . Chronic pain syndrome   . Complication of anesthesia   . Depression   . Diverticulosis   . Falls   . Fibromyalgia   . 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 Full dentures   . Gout   . Headache(784.0)    migraines  . Herpesviral infection   . Hypertension   . IBS (irritable bowel syndrome)    with diarrhea  . Lymphadenopathy   . Myalgia and myositis, unspecified   . PONV (postoperative nausea and vomiting)   . PTSD (post-traumatic stress disorder)   . Renal calculi   . Restless legs syndrome (RLS)   . Thoracic radiculopathy   . Vitamin D deficiency   . Wears glasses    BP (!) 155/89   Pulse (!) 106   Temp 98.9 F (37.2 C)   Ht 5\' 5"  (1.651 m)   Wt 159 lb 12.8 oz (72.5 kg)   SpO2 93%   BMI 26.59 kg/m   Opioid Risk Score:   Fall Risk Score:  `1  Depression screen PHQ 2/9  Depression screen [facility] 2/9 04/09/2020 02/04/2020 10/06/2019 04/30/2019 03/06/2019 01/07/2019 11/21/2018  Decreased Interest 1 1 1 1 1 1 1   Down, Depressed, Hopeless 1 1 1 1 1 1 1   PHQ - 2 Score 2 2 2 2 2 2 2   Altered sleeping - - - - - - 1  Tired, decreased energy - - - - - - 2  Change in appetite - - - - - - 1  Feeling bad or failure about yourself  - - - - - - 0  Trouble concentrating - - - - - - 1  Moving slowly or fidgety/restless - - - - - - 1  Suicidal thoughts - - - - - - 0  PHQ-9 Score - - - - - - 8  Difficult doing work/chores  - - - - - - Somewhat difficult  Some recent data might be hidden     Review of Systems  Musculoskeletal: Positive for arthralgias, back pain, gait problem and neck pain.       Spasms  Neurological: Positive for dizziness, tremors, weakness and numbness.       Tingling  All other systems reviewed and are negative.      Objective:   Physical Exam Vitals and nursing note reviewed.  Constitutional:      Appearance: Normal appearance.  Neck:     Comments: Cervical Paraspinal Tenderness: C-5-C-6 Cardiovascular:     Rate and Rhythm: Normal rate and regular rhythm.     Pulses: Normal pulses.     Heart sounds: Normal heart sounds.  Pulmonary:     Effort: Pulmonary effort is normal.     Breath sounds: Normal breath sounds.  Musculoskeletal:     Cervical back: Normal range of motion and neck supple.     Comments: Normal Muscle Bulk and Muscle Testing Reveals:  Upper Extremities: Full ROM and Muscle Strength 5/5 Bilateral AC Joint Tenderness  Thoracic Paraspinal Tenderness: T-7-T-9 Lumbar Paraspinal Tenderness: L-3-L-5 Bilateral Greater Trochanter Tenderness Lower Extremities: Full ROM and Muscle Strength 5/5 Arises from Table with ease using cane for support Narrow Based  Gait   Skin:    General: Skin is warm and dry.  Neurological:  Mental Status: She is alert and oriented to person, place, and time.  Psychiatric:        Mood and Affect: Mood normal.        Behavior: Behavior normal.           Assessment & Plan:  1.History of fibromyalgia with myofascial pain and multiple trigger points.Continue with Heat and exercise Regime. Continue withcurrent medication regimen withgabapentin.08/05/2020 2. Chronic migraine headaches.Continue to Monitor. S/P Botox.on 05/30/2017.08/05/2020 3. Midline Low Back Pain/ Lumbar Spondylosis/Lumbar degenerative disk disease, L4-5/ Lumbar Radiculitis: Continue with HEP and current medication regimen. Refilled: Fentanyl Patch22mcg  one patch every three days #10 and ContinueHydrocodone 5/325 mg one tablet every8hours as needed #80.07/06/2020.Continue with slow weaning of Hydrocodone. 4. 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 Urology Following.08/05/2020. 5. Right CTS: Continue to wear Wrist stabilizer.08/05/2020 6. Cervicalgia/ Cervical RadiculitisCervical Spondylosis: Continuecurrent medication regiment withGabapentin. Continue with HEP and Continue to monitor.08/05/2020 7. Muscle Spasm: Continuecurrent medication regimen withFlexeril as needed.08/05/2020 9. Bilateral Greater Trochanter Bursitis: Continue to alternate with ice and heat therapy. Continue HEP as Tolerated. Continue to monitor.08/05/2020 10. Bilateral Knee Pain: No complaints today. S/P Knee Injection by Dr Sharol Given on 02/23/2020. Orthopedics Following.Continue to Monitor. 08/05/2020   F/U in 1 month  20 minutes of face to face patient care time was spent during this visit. All questions were encouraged and answered.

## 2020-08-09 ENCOUNTER — Telehealth: Payer: Self-pay

## 2020-08-09 ENCOUNTER — Telehealth: Payer: Self-pay | Admitting: Hematology and Oncology

## 2020-08-09 NOTE — Telephone Encounter (Signed)
Ms. Dildine returned my call and has been rescheduled to see Dr. Alvy Bimler on 11/17 at 11am. Pt aware to arrive 30 minutes early.

## 2020-08-09 NOTE — Telephone Encounter (Signed)
She called and left a message to cancel tomorrows appt. Appt canceled. She is having a flare up of IBS. She is having diarrhea and will not be able to come to appt tomorrow. She is asking if appointment can be rescheduled before the end of the year.  Dr. Alvy Bimler notified and scheduling message sent to reschedule.

## 2020-08-10 ENCOUNTER — Inpatient Hospital Stay: Payer: Medicare Other | Admitting: Hematology and Oncology

## 2020-08-11 LAB — TOXASSURE SELECT,+ANTIDEPR,UR

## 2020-08-12 ENCOUNTER — Telehealth: Payer: Self-pay | Admitting: *Deleted

## 2020-08-12 NOTE — Telephone Encounter (Signed)
Urine drug screen for this encounter is consistent for prescribed medication 

## 2020-08-13 ENCOUNTER — Telehealth: Payer: Self-pay | Admitting: Physician Assistant

## 2020-08-13 DIAGNOSIS — I10 Essential (primary) hypertension: Secondary | ICD-10-CM

## 2020-08-13 NOTE — Telephone Encounter (Signed)
Referral has been placed. AS, CMA 

## 2020-08-13 NOTE — Telephone Encounter (Signed)
Patient needs referral to [facility], wishes to see Fransico Him.  Patient has appointment with Herb Grays in December.

## 2020-08-16 NOTE — Assessment & Plan Note (Deleted)
I recommend CT imaging to rule out recurrence

## 2020-08-16 NOTE — Assessment & Plan Note (Deleted)
I recommend CT imaging of the chest, abdomen and pelvis to rule out malignancy

## 2020-08-16 NOTE — Progress Notes (Deleted)
[facility] progress notes  Patient Care Team: Lorrene Reid, PA-C as PCP - General Richmond Campbell, MD as Consulting Physician (Gastroenterology) Festus Aloe, MD as Consulting Physician (Urology) Alexis Frock, MD as Consulting Physician (Urology) Bayard Hugger, NP as Nurse Practitioner (Physical Medicine and Rehabilitation) Meredith Staggers, MD as Consulting Physician (Physical Medicine and Rehabilitation) Kathrynn Ducking, MD as Consulting Physician (Neurology) Regal, Tamala Fothergill, DPM as Consulting Physician (Podiatry) Ricard Dillon, MD (Psychiatry) Prueter, Gustavo Lah (Physical Medicine and Rehabilitation) Sueanne Margarita, MD as Consulting Physician (Cardiology) Newt Minion, MD as Consulting Physician (Orthopedic Surgery) Calton Dach, MD as Referring Physician (Optometry) Heath Lark, MD as Consulting Physician (Hematology and Oncology)  CHIEF COMPLAINTS/PURPOSE OF VISIT:  Reestablish care, patient with history of lymphoma and kidney cancer  HISTORY OF PRESENTING ILLNESS:  Patty Salas 64 y.o. female was lost to follow-up since 2017.  She is referred here as a new patient to reestablish care I reviewed the patient's records extensive and collaborated the history with the patient. 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 Marlou Starks took out a lymph node in the right groin   01/01/2014 Imaging   Ct showed lymphadenopathy   01/26/2014 PET scan   Hypermetabolic retroperitoneal, periportal, and mesenteric lymph nodes is concerning for lymphoproliferative process.   03/06/2014 Pathology Results   Accession: [id number] right groin LN biopsy came back benign   06/02/2014 PET scan   Similar appearance of hypermetabolic retroperitoneal, periportal and mesenteric lymph nodes. Low grade lymphoproliferative disorder cannot be excluded.    12/01/2014 Imaging   Mildly enlarged upper abdominal/ retroperitoneal lymph nodes, measuring up to 13 mm, stable versus minimally increased. No evidence of splenomegaly.   12/29/2015 Pathology Results   Accession: [id number] LN biopsy of periaortic region was positive for low grade folliculr lymphoma. Leftf kidney specimen showed renal cell carcinoma   12/29/2015 Surgery   She underwent robotic-assisted laparoscopic left radical nephrectomy. Left retroperitoneal lymph node dissection.   03/06/2016 PET scan   PET Ct showed stable lymphadenopathy with no major change    Last appointment was in 2017.  There were multiple phone calls to try to reschedule her appointment until now.  MEDICAL HISTORY:  Past Medical History:  Diagnosis Date  . Anxiety   . Arthritis   . Asthma   . Bipolar 2 disorder ([place])    pt stated, "I have Bipolar 2 and it is remission"  . Bipolar affective ([place])   . Calcifying tendinitis of shoulder   . Carpal tunnel syndrome   . Cervical facet syndrome   . Chronic pain syndrome   . Complication of anesthesia   . Depression   . Diverticulosis   . Falls   . Fibromyalgia   . 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 Full dentures   . Gout   . Headache(784.0)    migraines  . Herpesviral infection   . Hypertension   . IBS (irritable bowel syndrome)    with diarrhea  . Lymphadenopathy   . Myalgia and myositis, unspecified   . PONV (postoperative nausea and vomiting)   . PTSD (post-traumatic stress disorder)   . Renal calculi   . Restless legs syndrome (RLS)   . Thoracic radiculopathy   . Vitamin D deficiency   . Wears glasses     SURGICAL HISTORY: Past Surgical History:  Procedure Laterality Date  .  kidney stones    . ABDOMINAL ADHESION SURGERY    . ABDOMINAL HYSTERECTOMY  1995   partial- hemmoraged after surgery-stitch came loose  . APPENDECTOMY  1993   hemmoraged after surgery- stitch came loose  . CERVICAL DISC  SURGERY  06/04/2001   with fusion  . CHOLECYSTECTOMY  1985  . colonoscopy    . COLONOSCOPY    . CYSTOSCOPY    . FOOT SURGERY     lt  . jj stent  07/2002   with ureteroscopy, cystoscopy and then removal of stent in office  . LITHOTRIPSY    . LYMPH NODE BIOPSY Right 03/06/2014   Procedure: right groin LYMPH NODE BIOPSY;  Surgeon: Merrie Roof, MD;  Location: [facility];  Service: General;  Laterality: Right;  . LYMPHADENECTOMY N/A 12/29/2015   Procedure: RETROPERITONEAL LYMPHADENECTOMY;  Surgeon: Alexis Frock, MD;  Location: [facility];  Service: Urology;  Laterality: N/A;  . NECK SURGERY  2002  . ROBOT ASSISTED LAPAROSCOPIC NEPHRECTOMY Left 12/29/2015   Procedure: XI ROBOTIC ASSISTED LEFT LAPAROSCOPIC NEPHRECTOMY;  Surgeon: Alexis Frock, MD;  Location: [facility];  Service: Urology;  Laterality: Left;  . TONSILLECTOMY  1976    SOCIAL HISTORY: Social History   Socioeconomic History  . Marital status: Married    Spouse name: Not on file  . Number of children: 1  . Years of education: COLLEGE1  . Highest education level: Not on file  Occupational History  . Occupation: HOUSEWIFE    Employer: UNEMPLOYED  Tobacco Use  . Smoking status: Never Smoker  . Smokeless tobacco: Never Used  Substance and Sexual Activity  . Alcohol use: No  . Drug use: No  . Sexual activity: Not on file  Other Topics Concern  . Not on file  Social History Narrative   Patient is right handed.   Patient drinks caffeine occasionally.   Social Determinants of Health   Financial Resource Strain:   . Difficulty of Paying Living Expenses: Not on file  Food Insecurity:   . Worried About Charity fundraiser in the Last Year: Not on file  . Ran Out of Food in the Last Year: Not on file  Transportation Needs:   . Lack of Transportation (Medical): Not on file  . Lack of Transportation (Non-Medical): Not on file  Physical Activity:   . Days of Exercise per Week: Not on file  . Minutes of  Exercise per Session: Not on file  Stress:   . Feeling of Stress : Not on file  Social Connections:   . Frequency of Communication with Friends and Family: Not on file  . Frequency of Social Gatherings with Friends and Family: Not on file  . Attends Religious Services: Not on file  . Active Member of Clubs or Organizations: Not on file  . Attends Archivist Meetings: Not on file  . Marital Status: Not on file  Intimate Partner Violence:   . Fear of Current or Ex-Partner: Not on file  . Emotionally Abused: Not on file  . Physically Abused: Not on file  . Sexually Abused: Not on file    FAMILY HISTORY: Family History  Problem Relation Age of Onset  . Cancer Mother        stomach  . Migraines Mother   . Arthritis Mother   . Emphysema Mother   . Heart disease Father   . Hypertension Father   . Cancer Father  colon  . Dementia Father   . Hypertension Brother   . Migraines Brother   . Hypertension Brother   . Migraines Brother   . Alcohol abuse Brother   . Bipolar disorder Brother   . Migraines Daughter   . Arthritis Maternal Grandmother   . Cancer Maternal Grandmother        stomach  . Diabetes Maternal Grandmother   . Stroke Maternal Grandmother   . Cancer Maternal Aunt        lung  . Emphysema Maternal Aunt   . Cancer Paternal Uncle        colon  . Hypertension Maternal Aunt   . Heart disease Maternal Aunt   . Cancer Paternal Uncle        lung    ALLERGIES:  is allergic to cymbalta [duloxetine hcl], divalproex sodium, duract [bromfenac], hydrochlorothiazide, imitrex [sumatriptan succinate], latex, mellaril, olanzapine, penicillins, topamax, aripiprazole, aspirin, dalmane [flurazepam hcl], darifenacin hydrobromide er, metoclopramide hcl, seroquel [quetiapine fumerate], statins, stelazine, thorazine [chlorpromazine hcl], abilify [aripiprazole], alka-seltzer [aspirin effervescent], allopurinol, biofreeze [menthol (topical analgesic)], capzasin  [capsaicin], clindamycin/lincomycin, clove oil, colchicine, cough drops [benzocaine], diflucan [fluconazole], garlic, iohexol, ivp dye [iodinated diagnostic agents], lyrica [pregabalin], metamucil [psyllium], miralax [polyethylene glycol], myrbetriq [mirabegron], nsaids, other, potassium-containing compounds, prednisone, reglan [metoclopramide], renografin [diatrizoate], risperdal [risperidone], seroquel [quetiapine fumarate], simvastatin, urocit - k [potassium citrate], zyprexa [olanzapine], avelox [moxifloxacin hcl in nacl], barium-containing compounds, diclofenac, doxycycline, e-mycin [erythromycin base], fiorinal [butalbital-aspirin-caffeine], flagyl [metronidazole hcl], lamictal [lamotrigine], pregabalin, risperidone, and toradol [ketorolac tromethamine].  MEDICATIONS:  Current Outpatient Medications  Medication Sig Dispense Refill  . acetaminophen (TYLENOL) 500 MG tablet Take 500 mg by mouth every 6 (six) hours as needed for moderate pain.    Marland Kitchen acyclovir (ZOVIRAX) 800 MG tablet Take 800 mg by mouth as needed.    Marland Kitchen albuterol (PROVENTIL HFA;VENTOLIN HFA) 108 (90 Base) MCG/ACT inhaler Inhale 2 puffs into the lungs every 6 (six) hours as needed for wheezing or shortness of breath.    Marland Kitchen almotriptan (AXERT) 12.5 MG tablet TAKE 1 TABLET (12.5 MG TOTAL) BY MOUTH AS NEEDED FOR MIGRAINE (MAX 2 TABS PER WEEK). THIS IS 90-DAY RX. 24 tablet 0  . bisacodyl (DULCOLAX) 5 MG EC tablet Take 10 mg by mouth daily as needed for mild constipation.     . cyclobenzaprine (FLEXERIL) 10 MG tablet Take 1 tablet (10 mg total) by mouth 3 (three) times daily. 90 tablet 4  . docusate sodium (COLACE) 100 MG capsule Take 100 mg by mouth daily as needed for mild constipation.     Marland Kitchen EPINEPHrine 0.3 mg/0.3 mL IJ SOAJ injection Inject 0.3 mLs (0.3 mg total) into the muscle as needed for anaphylaxis. **PATIENT WILL NEED APT FOR ANY FURTHER REFILLS** 2 each 0  . EPINEPHrine 0.3 mg/0.3 mL IJ SOAJ injection INJECT 0.3 MLS INTO THE MUSCLE  AS NEEDED FOR ANAPHYLAXIS. NEED OFFICE VISIT    . fentaNYL (DURAGESIC) 12 MCG/HR Place 1 patch onto the skin every 3 (three) days. 10 patch 0  . fexofenadine (ALLEGRA) 180 MG tablet Take 1 tablet (180 mg total) by mouth daily. 30 tablet 0  . gabapentin (NEURONTIN) 300 MG capsule Take 600 mg by mouth at bedtime.    Marland Kitchen HYDROcodone-acetaminophen (NORCO/VICODIN) 5-325 MG tablet Take 1 tablet by mouth every 8 (eight) hours as needed for moderate pain. 80 tablet 0  . hyoscyamine (LEVSIN, ANASPAZ) 0.125 MG tablet Take 0.125 mg by mouth every 4 (four) hours as needed for bladder spasms or cramping.     Marland Kitchen  lidocaine (XYLOCAINE) 2 % solution Use as directed 20 mLs in the mouth or throat daily as needed for mouth pain.     Marland Kitchen LORazepam (ATIVAN) 1 MG tablet Take 1 mg by mouth 3 (three) times daily.    . promethazine (PHENERGAN) 25 MG tablet Take 0.5 tablets (12.5 mg total) by mouth every 6 (six) hours as needed for nausea or vomiting. 90 tablet 0  . Trospium Chloride 60 MG CP24 Take 1 capsule by mouth every morning.     . Vitamin D, Ergocalciferol, (DRISDOL) 50000 UNITS CAPS capsule Take 50,000 Units by mouth every 7 (seven) days.      No current facility-administered medications for this visit.    REVIEW OF SYSTEMS:   Constitutional: Denies fevers, chills or abnormal night sweats Eyes: Denies blurriness of vision, double vision or watery eyes Ears, nose, mouth, throat, and face: Denies mucositis or sore throat Respiratory: Denies cough, dyspnea or wheezes Cardiovascular: Denies palpitation, chest discomfort or lower extremity swelling Gastrointestinal:  Denies nausea, heartburn or change in bowel habits Skin: Denies abnormal skin rashes Lymphatics: Denies new lymphadenopathy or easy bruising Neurological:Denies numbness, tingling or new weaknesses Behavioral/Psych: Mood is stable, no new changes  All other systems were reviewed with the patient and are negative.  PHYSICAL EXAMINATION: ECOG PERFORMANCE  STATUS: {CHL ONC ECOG PS:[id number]}  There were no vitals filed for this visit. There were no vitals filed for this visit.  GENERAL:alert, no distress and comfortable SKIN: skin color, texture, turgor are normal, no rashes or significant lesions EYES: normal, conjunctiva are pink and non-injected, sclera clear OROPHARYNX:no exudate, normal lips, buccal mucosa, and tongue  NECK: supple, thyroid normal size, non-tender, without nodularity LYMPH:  no palpable lymphadenopathy in the cervical, axillary or inguinal LUNGS: clear to auscultation and percussion with normal breathing effort HEART: regular rate & rhythm and no murmurs without lower extremity edema ABDOMEN:abdomen soft, non-tender and normal bowel sounds Musculoskeletal:no cyanosis of digits and no clubbing  PSYCH: alert & oriented x 3 with fluent speech NEURO: no focal motor/sensory deficits  LABORATORY DATA:  I have reviewed the data as listed Lab Results  Component Value Date   WBC 5.5 03/24/2020   HGB 11.9 03/24/2020   HCT 35.8 03/24/2020   MCV 92 03/24/2020   PLT 296 03/24/2020   Recent Labs    03/24/20 1000  NA 139  K 4.6  CL 99  CO2 29  GLUCOSE 93  BUN 13  CREATININE 0.70  CALCIUM 9.7  GFRNONAA 91  GFRAA 105  PROT 7.4  ALBUMIN 4.3  AST 34  ALT 29  ALKPHOS 118  BILITOT 0.4    RADIOGRAPHIC STUDIES: I have personally reviewed the radiological images as listed and agreed with the findings in the report. No results found.  ASSESSMENT & PLAN:  No problem-specific Assessment & Plan notes found for this encounter.   No orders of the defined types were placed in this encounter.   All questions were answered. The patient knows to call the clinic with any problems, questions or concerns. The total time spent in the appointment was {CHL ONC TIME VISIT - OHYWV:[id number]} encounter with patients including review of chart and various tests results, discussions about plan of care and coordination of care  plan   Heath Lark, MD 08/16/2020 2:38 PM

## 2020-08-17 ENCOUNTER — Telehealth: Payer: Self-pay | Admitting: Hematology and Oncology

## 2020-08-17 NOTE — Telephone Encounter (Signed)
Received a call from Ms. Tortorelli to reschedule her new pt appt w/Dr. Alvy Bimler on 12/2 at 145pm. Pt was unable to come to her appt on 11/17 due to illness.

## 2020-08-18 ENCOUNTER — Ambulatory Visit: Payer: Medicare Other | Admitting: Hematology and Oncology

## 2020-08-18 ENCOUNTER — Inpatient Hospital Stay: Payer: Medicare Other | Admitting: Hematology and Oncology

## 2020-08-18 DIAGNOSIS — C649 Malignant neoplasm of unspecified kidney, except renal pelvis: Secondary | ICD-10-CM

## 2020-08-18 DIAGNOSIS — Z85528 Personal history of other malignant neoplasm of kidney: Secondary | ICD-10-CM

## 2020-08-18 DIAGNOSIS — C8206 Follicular lymphoma grade I, intrapelvic lymph nodes: Secondary | ICD-10-CM

## 2020-08-25 ENCOUNTER — Ambulatory Visit: Payer: Medicare Other | Admitting: Physical Medicine & Rehabilitation

## 2020-08-29 ENCOUNTER — Other Ambulatory Visit: Payer: Self-pay

## 2020-08-31 ENCOUNTER — Other Ambulatory Visit: Payer: Self-pay

## 2020-09-01 ENCOUNTER — Encounter: Payer: Medicare Other | Admitting: Physical Medicine & Rehabilitation

## 2020-09-01 ENCOUNTER — Telehealth: Payer: Self-pay | Admitting: *Deleted

## 2020-09-01 ENCOUNTER — Telehealth: Payer: Self-pay | Admitting: Hematology and Oncology

## 2020-09-01 NOTE — Telephone Encounter (Signed)
Patient called stating she had to cancel today's appointment because she started a fever last night. She says she is having severe diarrhea, vomiting, headache, and cramping. She has rescheduled for [facility] 10/05/2020 and Dr. Naaman Plummer 10/27/2020 for trigger points. She needs refills on her Norco and fentanyl

## 2020-09-01 NOTE — Telephone Encounter (Signed)
Thanks for letting me know This is the 4th time she rescheduled last minute before her appt While I appreciated she called, her actions have caused significant loss of appointment availability I will allow this last reschedule but if she call again to cancel, she would not be able to reschedule in the future, FYI

## 2020-09-01 NOTE — Telephone Encounter (Signed)
Ms. Balik cld to reschedule her new pt appt for 12/2 due to illness.  Pt has been rescheduled to 12/15 at 1pm. Pt aware to arrive 30 minutes early.

## 2020-09-02 ENCOUNTER — Inpatient Hospital Stay: Payer: Medicare Other | Admitting: Hematology and Oncology

## 2020-09-02 MED ORDER — FENTANYL 12 MCG/HR TD PT72: 1.0000 | MEDICATED_PATCH | TRANSDERMAL | 0 refills | Status: DC

## 2020-09-02 MED ORDER — HYDROCODONE-ACETAMINOPHEN 5-325 MG PO TABS: 1.0000 | ORAL_TABLET | Freq: Three times a day (TID) | ORAL | 0 refills | Status: DC | PRN

## 2020-09-02 NOTE — Telephone Encounter (Signed)
Refills in

## 2020-09-06 ENCOUNTER — Other Ambulatory Visit: Payer: Self-pay | Admitting: Neurology

## 2020-09-07 ENCOUNTER — Telehealth: Payer: Self-pay | Admitting: Neurology

## 2020-09-07 MED ORDER — ALMOTRIPTAN MALATE 12.5 MG PO TABS
ORAL_TABLET | ORAL | 0 refills | Status: DC
Start: 2020-09-07 — End: 2020-12-13

## 2020-09-07 NOTE — Telephone Encounter (Signed)
Pt calling in multiple refill request for almotriptan (AXERT) 12.5 MG tablet. Medication has been refused each request. Please advise

## 2020-09-08 ENCOUNTER — Encounter: Payer: Self-pay | Admitting: Cardiology

## 2020-09-08 ENCOUNTER — Telehealth: Payer: Self-pay | Admitting: Radiology

## 2020-09-08 ENCOUNTER — Ambulatory Visit (INDEPENDENT_AMBULATORY_CARE_PROVIDER_SITE_OTHER): Payer: Medicare Other | Admitting: Cardiology

## 2020-09-08 ENCOUNTER — Other Ambulatory Visit: Payer: Self-pay

## 2020-09-08 VITALS — BP 132/84 | HR 105 | Ht 65.0 in | Wt 154.2 lb

## 2020-09-08 DIAGNOSIS — I10 Essential (primary) hypertension: Secondary | ICD-10-CM

## 2020-09-08 DIAGNOSIS — R000 Tachycardia, unspecified: Secondary | ICD-10-CM

## 2020-09-08 DIAGNOSIS — R600 Localized edema: Secondary | ICD-10-CM

## 2020-09-08 NOTE — Telephone Encounter (Signed)
Enrolled patient for a 3 day Zio XT Monitor to be mailed to patients home.  °

## 2020-09-08 NOTE — Progress Notes (Signed)
Cardiology Consult Note    Date:  09/08/2020   ID:  Patty Salas, DOB [birth year], MRN [id number]  PCP:  Lorrene Reid, PA-C  Cardiologist:  Fransico Him, MD   Chief Complaint  Patient presents with  . New Patient (Initial Visit)    HTN    History of Present Illness:  Patty Salas is a 64 y.o. female who is being seen today for the evaluation of LE edema and pain in her upper abdomen at the request of Lorrene Reid, PA-C.  This is a 64yo female with a hx of anxiety, asthma, fibromyalgia, HTN who is referred for evaluation of LE edema.  She has a hx of renal cell CA and is s/p nephrectomy.  She recently had problems with renal stones. She has a hx of IBS and diarrhea as well.  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    She recently had some problems with weight gain and LE edema and pinkness of her legs.  She is now here for evaluation of edema.  She denies any exertional chest discomfort.  She has chronic DOE related to asthma and had been on albuterol but not taking recently.  She occasionally has dizziness related to vertigo but no palpitations.  She complains of sharp pain in her upper abdomen on both the right and left sides that is sharp and occurs with certain movements mainly when bending down.  She has no exertional pain.    Past Medical History:  Diagnosis Date  . Anxiety   . Arthritis   . Asthma   . Bipolar 2 disorder ([place])    pt stated, "I have Bipolar 2 and it is remission"  . Bipolar affective ([place])   . Calcifying tendinitis of shoulder   . Carpal tunnel syndrome   . Cervical facet syndrome   . Chronic pain syndrome   . Complication of anesthesia   . Depression   . Diverticulosis   . Falls   . Fibromyalgia   . 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 Full dentures   . Gout   . Headache(784.0)    migraines  . Herpesviral infection   . Hypertension   . IBS (irritable bowel syndrome)    with  diarrhea  . Lymphadenopathy   . Myalgia and myositis, unspecified   . PONV (postoperative nausea and vomiting)   . PTSD (post-traumatic stress disorder)   . Renal calculi   . Restless legs syndrome (RLS)   . Thoracic radiculopathy   . Vitamin D deficiency   . Wears glasses     Past Surgical History:  Procedure Laterality Date  .  kidney stones    . ABDOMINAL ADHESION SURGERY    . ABDOMINAL HYSTERECTOMY  1995   partial- hemmoraged after surgery-stitch came loose  . APPENDECTOMY  1993   hemmoraged after surgery- stitch came loose  . CERVICAL DISC SURGERY  06/04/2001   with fusion  . CHOLECYSTECTOMY  1985  . colonoscopy    . COLONOSCOPY    . CYSTOSCOPY    . FOOT SURGERY     lt  . jj stent  07/2002   with ureteroscopy, cystoscopy and then removal of stent in office  . LITHOTRIPSY    . LYMPH NODE BIOPSY Right 03/06/2014   Procedure: right groin LYMPH NODE BIOPSY;  Surgeon: Merrie Roof, MD;  Location: [facility];  Service: General;  Laterality: Right;  .  LYMPHADENECTOMY N/A 12/29/2015   Procedure: RETROPERITONEAL LYMPHADENECTOMY;  Surgeon: Alexis Frock, MD;  Location: [facility];  Service: Urology;  Laterality: N/A;  . NECK SURGERY  2002  . ROBOT ASSISTED LAPAROSCOPIC NEPHRECTOMY Left 12/29/2015   Procedure: XI ROBOTIC ASSISTED LEFT LAPAROSCOPIC NEPHRECTOMY;  Surgeon: Alexis Frock, MD;  Location: [facility];  Service: Urology;  Laterality: Left;  . TONSILLECTOMY  1976    Current Medications: Current Meds  Medication Sig  . acetaminophen (TYLENOL) 500 MG tablet Take 500 mg by mouth every 6 (six) hours as needed for moderate pain.  Marland Kitchen acyclovir (ZOVIRAX) 800 MG tablet Take 800 mg by mouth as needed.  Marland Kitchen albuterol (PROVENTIL HFA;VENTOLIN HFA) 108 (90 Base) MCG/ACT inhaler Inhale 2 puffs into the lungs every 6 (six) hours as needed for wheezing or shortness of breath.  Marland Kitchen almotriptan (AXERT) 12.5 MG tablet TAKE 1 TABLET (12.5 MG TOTAL) BY MOUTH AS NEEDED FOR MIGRAINE (MAX  2 TABS PER WEEK). THIS IS 90-DAY RX.  . bisacodyl (DULCOLAX) 5 MG EC tablet Take 10 mg by mouth daily as needed for mild constipation.   . cyclobenzaprine (FLEXERIL) 10 MG tablet Take 1 tablet (10 mg total) by mouth 3 (three) times daily.  Marland Kitchen docusate sodium (COLACE) 100 MG capsule Take 100 mg by mouth daily as needed for mild constipation.   Marland Kitchen EPINEPHrine 0.3 mg/0.3 mL IJ SOAJ injection Inject 0.3 mLs (0.3 mg total) into the muscle as needed for anaphylaxis. **PATIENT WILL NEED APT FOR ANY FURTHER REFILLS**  . fentaNYL (DURAGESIC) 12 MCG/HR Place 1 patch onto the skin every 3 (three) days.  . fexofenadine (ALLEGRA) 180 MG tablet Take 1 tablet (180 mg total) by mouth daily.  Marland Kitchen gabapentin (NEURONTIN) 300 MG capsule Take 600 mg by mouth at bedtime.  Marland Kitchen HYDROcodone-acetaminophen (NORCO/VICODIN) 5-325 MG tablet Take 1 tablet by mouth every 8 (eight) hours as needed for moderate pain.  . hyoscyamine (LEVSIN, ANASPAZ) 0.125 MG tablet Take 0.125 mg by mouth every 4 (four) hours as needed for bladder spasms or cramping.   . lidocaine (XYLOCAINE) 2 % solution Use as directed 20 mLs in the mouth or throat daily as needed for mouth pain.   Marland Kitchen LORazepam (ATIVAN) 1 MG tablet Take 1 mg by mouth 3 (three) times daily.  . promethazine (PHENERGAN) 25 MG tablet TAKE 0.5 TABLETS (12.5 MG TOTAL) BY MOUTH EVERY 6 (SIX) HOURS AS NEEDED FOR NAUSEA OR VOMITING.  . Trospium Chloride 60 MG CP24 Take 1 capsule by mouth every morning.   . [DISCONTINUED] EPINEPHrine 0.3 mg/0.3 mL IJ SOAJ injection INJECT 0.3 MLS INTO THE MUSCLE AS NEEDED FOR ANAPHYLAXIS. NEED OFFICE VISIT    Allergies:   Cymbalta [duloxetine hcl], Divalproex sodium, Duract [bromfenac], Hydrochlorothiazide, Imitrex [sumatriptan succinate], Latex, Mellaril, Olanzapine, Penicillins, Topamax, Aripiprazole, Aspirin, Dalmane [flurazepam hcl], Darifenacin hydrobromide er, Metoclopramide hcl, Seroquel [quetiapine fumerate], Statins, Stelazine, Thorazine [chlorpromazine  hcl], Abilify [aripiprazole], Alka-seltzer [aspirin effervescent], Allopurinol, Biofreeze [menthol (topical analgesic)], Capzasin [capsaicin], Clindamycin/lincomycin, Clove oil, Colchicine, Cough drops [benzocaine], Diflucan [fluconazole], Garlic, Iohexol, Ivp dye [iodinated diagnostic agents], Lyrica [pregabalin], Metamucil [psyllium], Miralax [polyethylene glycol], Myrbetriq [mirabegron], Nsaids, Other, Potassium-containing compounds, Prednisone, Reglan [metoclopramide], Renografin [diatrizoate], Risperdal [risperidone], Seroquel [quetiapine fumarate], Simvastatin, Urocit - k [potassium citrate], Zyprexa [olanzapine], Avelox [moxifloxacin hcl in nacl], Barium-containing compounds, Diclofenac, Doxycycline, E-mycin [erythromycin base], Fiorinal [butalbital-aspirin-caffeine], Flagyl [metronidazole hcl], Lamictal [lamotrigine], Pregabalin, Risperidone, and Toradol [ketorolac tromethamine]   Social History   Socioeconomic History  . Marital status: Married    Spouse name: Not on file  .  Number of children: 1  . Years of education: COLLEGE1  . Highest education level: Not on file  Occupational History  . Occupation: HOUSEWIFE    Employer: UNEMPLOYED  Tobacco Use  . Smoking status: Never Smoker  . Smokeless tobacco: Never Used  Substance and Sexual Activity  . Alcohol use: No  . Drug use: No  . Sexual activity: Not on file  Other Topics Concern  . Not on file  Social History Narrative   Patient is right handed.   Patient drinks caffeine occasionally.   Social Determinants of Health   Financial Resource Strain:   . Difficulty of Paying Living Expenses: Not on file  Food Insecurity:   . Worried About Charity fundraiser in the Last Year: Not on file  . Ran Out of Food in the Last Year: Not on file  Transportation Needs:   . Lack of Transportation (Medical): Not on file  . Lack of Transportation (Non-Medical): Not on file  Physical Activity:   . Days of Exercise per Week: Not on file  .  Minutes of Exercise per Session: Not on file  Stress:   . Feeling of Stress : Not on file  Social Connections:   . Frequency of Communication with Friends and Family: Not on file  . Frequency of Social Gatherings with Friends and Family: Not on file  . Attends Religious Services: Not on file  . Active Member of Clubs or Organizations: Not on file  . Attends Archivist Meetings: Not on file  . Marital Status: Not on file     Family History:  The patient's family history includes Alcohol abuse in her brother; Arthritis in her maternal grandmother and mother; Bipolar disorder in her brother; Cancer in her father, maternal aunt, maternal grandmother, mother, paternal uncle, and paternal uncle; Dementia in her father; Diabetes in her maternal grandmother; Emphysema in her maternal aunt and mother; Heart disease in her father and maternal aunt; Hypertension in her brother, brother, father, and maternal aunt; Migraines in her brother, brother, daughter, and mother; Stroke in her maternal grandmother.   ROS:   Please see the history of present illness.    ROS All other systems reviewed and are negative.  No flowsheet data found.  PHYSICAL EXAM:   VS:  BP 132/84   Pulse (!) 105   Ht 5\' 5"  (1.651 m)   Wt 154 lb 3.2 oz (69.9 kg)   SpO2 98%   BMI 25.66 kg/m    GEN: Well nourished, well developed, in no acute distress  HEENT: normal  Neck: no JVD, carotid bruits, or masses Cardiac: RRR; no murmurs, rubs, or gallops,no edema.  Intact distal pulses bilaterally.  Respiratory:  clear to auscultation bilaterally, normal work of breathing GI: soft, nontender, nondistended, + BS MS: no deformity or atrophy  Skin: warm and dry, no rash Neuro:  Alert and Oriented x 3, Strength and sensation are intact Psych: euthymic mood, full affect  Wt Readings from Last 3 Encounters:  09/08/20 154 lb 3.2 oz (69.9 kg)  08/05/20 159 lb 12.8 oz (72.5 kg)  07/06/20 153 lb 6.4 oz (69.6 kg)       Studies/Labs Reviewed:   EKG:  EKG is ordered today.  The ekg ordered today demonstrates Sinus tachycardia at 105bpm with no ST changes  Recent Labs: 03/24/2020: ALT 29; BUN 13; Creatinine, Ser 0.70; Hemoglobin 11.9; Platelets 296; Potassium 4.6; Sodium 139; TSH 1.010   Lipid Panel    Component Value Date/Time  CHOL 221 (H) 03/24/2020 1000   TRIG 156 (H) 03/24/2020 1000   HDL 68 03/24/2020 1000   CHOLHDL 3.3 03/24/2020 1000   LDLCALC 126 (H) 03/24/2020 1000      Additional studies/ records that were reviewed today include:  EKG    ASSESSMENT:    1. Primary hypertension   2. Leg edema   3. Sinus tachycardia      PLAN:  In order of problems listed above:  1. HTN -BP controlled on exam -diet controlled  2.  LE edema -? Whether this is related to her pelvic lymphoma -encouraged her to limit Na intake in her diet -check 2D echo to rule out pericardial effusion and assess LVF  3.  Sinus tachycardia -HR is mildly elevated at 103bpm today -likely related to chronic pain -will get a 3 day ziopatch to evaluate for average heart    Medication Adjustments/Labs and Tests Ordered: Current medicines are reviewed at length with the patient today.  Concerns regarding medicines are outlined above.  Medication changes, Labs and Tests ordered today are listed in the Patient Instructions below.  There are no Patient Instructions on file for this visit.   Signed, Fransico Him, MD  09/08/2020 1:13 PM    [facility] [address], [place], [place]  [address] Phone: [phone]; Fax: [phone]

## 2020-09-08 NOTE — Addendum Note (Signed)
Addended by: Antonieta Iba on: 09/08/2020 01:21 PM   Modules accepted: Orders

## 2020-09-08 NOTE — Patient Instructions (Signed)
Medication Instructions:  Your physician recommends that you continue on your current medications as directed. Please refer to the Current Medication list given to you today.  *If you need a refill on your cardiac medications before your next appointment, please call your pharmacy*   Testing/Procedures: Your physician has requested that you have an echocardiogram. Echocardiography is a painless test that uses sound waves to create images of your heart. It provides your doctor with information about the size and shape of your heart and how well your heart's chambers and valves are working. This procedure takes approximately one hour. There are no restrictions for this procedure.  Your physician has recommended that you wear an event monitor. Event monitors are medical devices that record the heart's electrical activity. Doctors most often us these monitors to diagnose arrhythmias. Arrhythmias are problems with the speed or rhythm of the heartbeat. The monitor is a small, portable device. You can wear one while you do your normal daily activities. This is usually used to diagnose what is causing palpitations/syncope (passing out).   Follow-Up: At [facility], you and your health needs are our priority.  As part of our continuing mission to provide you with exceptional heart care, we have created designated Provider Care Teams.  These Care Teams include your primary Cardiologist (physician) and Advanced Practice Providers (APPs -  Physician Assistants and Nurse Practitioners) who all work together to provide you with the care you need, when you need it.  Follow up with Dr. Turner as needed based on results of testing 

## 2020-09-10 ENCOUNTER — Ambulatory Visit (INDEPENDENT_AMBULATORY_CARE_PROVIDER_SITE_OTHER): Payer: Medicare Other

## 2020-09-10 DIAGNOSIS — R000 Tachycardia, unspecified: Secondary | ICD-10-CM | POA: Diagnosis not present

## 2020-09-14 ENCOUNTER — Telehealth: Payer: Self-pay | Admitting: Hematology and Oncology

## 2020-09-14 NOTE — Telephone Encounter (Signed)
Ms. Smyre cld to cancel her appt w/Dr. Alvy Bimler on 12/16. I explained to her that due to several cancellations I can't reschedule her appt at this time. I asked that she call back when she's ready to schedule.

## 2020-09-15 ENCOUNTER — Inpatient Hospital Stay: Payer: Medicare Other | Admitting: Hematology and Oncology

## 2020-09-20 ENCOUNTER — Ambulatory Visit: Payer: No Typology Code available for payment source | Admitting: Physician Assistant

## 2020-09-25 ENCOUNTER — Encounter: Payer: Self-pay | Admitting: Cardiology

## 2020-09-25 DIAGNOSIS — I471 Supraventricular tachycardia: Secondary | ICD-10-CM | POA: Insufficient documentation

## 2020-09-25 DIAGNOSIS — I4719 Other supraventricular tachycardia: Secondary | ICD-10-CM | POA: Insufficient documentation

## 2020-09-27 ENCOUNTER — Telehealth: Payer: Self-pay

## 2020-09-27 ENCOUNTER — Other Ambulatory Visit: Payer: Self-pay

## 2020-09-27 NOTE — Telephone Encounter (Signed)
The patient has been notified of the result and verbalized understanding.  All questions (if any) were answered. Patient denies any palpitations.  Patient reports some increased BP readings over the past couple weeks. She states that she did experience some dizziness with increased BP. She has a history of vertigo. She reports two weeks ago a reading of 147/68 with HR 101. She states that she was dizzy but no syncope.  She states that this past Saturday 12/25 she was having dizziness, vomiting, and diarrhea. She states that BP was 162/86 with HR 98. Later that day 142/86, HR 94. That same evening 98/75 and HR 91. She reports that she was trying to stay hydrated with water and ginger ale. She has a history of IBS.  She also reports that her legs are still swollen. She has been elevating her legs and using compression hose. She also has an adjustable bed and keeps her legs elevated when she is lying down. She states that her BP is usually around 110/60. She states that she has been keeping a log of her readings and she will bring in the list when she comes to have her echocardiogram.

## 2020-09-27 NOTE — Telephone Encounter (Signed)
-----   Message from Quintella Reichert, MD sent at 09/25/2020  6:34 PM EST ----- Heart monitor showed normal rhythm with 1 episode of extra heart beats in a row from the top of the heart for 10 beats - these are benign and if she is not having palpitations, do not require any treatment

## 2020-09-29 ENCOUNTER — Telehealth: Payer: Self-pay | Admitting: Registered Nurse

## 2020-09-29 MED ORDER — FENTANYL 12 MCG/HR TD PT72: 1.0000 | MEDICATED_PATCH | TRANSDERMAL | 0 refills | Status: DC

## 2020-09-29 MED ORDER — HYDROCODONE-ACETAMINOPHEN 5-325 MG PO TABS: 1.0000 | ORAL_TABLET | Freq: Three times a day (TID) | ORAL | 0 refills | Status: DC | PRN

## 2020-09-29 NOTE — Telephone Encounter (Signed)
PMP was Reviewed. Fentanyl and Hydrocodone e-scribed today.  

## 2020-09-30 ENCOUNTER — Ambulatory Visit: Payer: Medicare Other | Admitting: Neurology

## 2020-10-05 ENCOUNTER — Other Ambulatory Visit: Payer: Self-pay

## 2020-10-05 ENCOUNTER — Encounter: Payer: Self-pay | Admitting: Registered Nurse

## 2020-10-05 ENCOUNTER — Encounter: Payer: Medicare Other | Attending: Physical Medicine & Rehabilitation | Admitting: Registered Nurse

## 2020-10-05 VITALS — BP 129/68 | HR 95 | Ht 66.5 in | Wt 156.2 lb

## 2020-10-05 DIAGNOSIS — M4716 Other spondylosis with myelopathy, lumbar region: Secondary | ICD-10-CM

## 2020-10-05 DIAGNOSIS — M7061 Trochanteric bursitis, right hip: Secondary | ICD-10-CM

## 2020-10-05 DIAGNOSIS — M542 Cervicalgia: Secondary | ICD-10-CM

## 2020-10-05 DIAGNOSIS — M5412 Radiculopathy, cervical region: Secondary | ICD-10-CM

## 2020-10-05 DIAGNOSIS — M546 Pain in thoracic spine: Secondary | ICD-10-CM | POA: Insufficient documentation

## 2020-10-05 DIAGNOSIS — M7062 Trochanteric bursitis, left hip: Secondary | ICD-10-CM | POA: Diagnosis present

## 2020-10-05 DIAGNOSIS — G8929 Other chronic pain: Secondary | ICD-10-CM

## 2020-10-05 DIAGNOSIS — M5416 Radiculopathy, lumbar region: Secondary | ICD-10-CM | POA: Diagnosis present

## 2020-10-05 DIAGNOSIS — M797 Fibromyalgia: Secondary | ICD-10-CM | POA: Diagnosis present

## 2020-10-05 DIAGNOSIS — M47812 Spondylosis without myelopathy or radiculopathy, cervical region: Secondary | ICD-10-CM | POA: Diagnosis present

## 2020-10-05 NOTE — Progress Notes (Signed)
Subjective:    Patient ID: Patty Salas, female    DOB: [birth year], 65 y.o.   MRN: [id number]  HPI: Patty Salas is a 65 y.o. female who returns for follow up appointment for chronic pain and medication refill. She states her pain is located in her neck, mid- lower back pain radiating into her bilateral lower extremities and bilateral hip pain. She rates her pain 10. Her current exercise regime is walking and performing stretching exercises.  Ms. Neighbors Morphine equivalent is 40.66  MME. She  is also prescribed Lorazepam by Dr. Fay Records. We have discussed the black box warning of using opioids and benzodiazepines. I highlighted the dangers of using these drugs together and discussed the adverse events including respiratory suppression, overdose, cognitive impairment and importance of compliance with current regimen. We will continue to monitor and adjust as indicated.  She  is being closely monitored and under the care of her psychiatrist.   Last UDS was Performed on 08/05/2020, it was consistent.      Pain Inventory Average Pain 9 Pain Right Now 10 My pain is constant, sharp, burning, stabbing, tingling and aching  In the last 24 hours, has pain interfered with the following? General activity 9 Relation with others 7 Enjoyment of life 8 What TIME of day is your pain at its worst? morning , daytime, evening and night Sleep (in general) Fair  Pain is worse with: walking, bending, sitting, standing and some activites Pain improves with: rest, heat/ice, therapy/exercise, medication and injections Relief from Meds: 6  Family History  Problem Relation Age of Onset  . Cancer Mother        stomach  . Migraines Mother   . Arthritis Mother   . Emphysema Mother   . Heart disease Father   . Hypertension Father   . Cancer Father        colon  . Dementia Father   . Hypertension Brother   . Migraines Brother   . Hypertension Brother   . Migraines Brother   . Alcohol abuse Brother    . Bipolar disorder Brother   . Migraines Daughter   . Arthritis Maternal Grandmother   . Cancer Maternal Grandmother        stomach  . Diabetes Maternal Grandmother   . Stroke Maternal Grandmother   . Cancer Maternal Aunt        lung  . Emphysema Maternal Aunt   . Cancer Paternal Uncle        colon  . Hypertension Maternal Aunt   . Heart disease Maternal Aunt   . Cancer Paternal Uncle        lung   Social History   Socioeconomic History  . Marital status: Married    Spouse name: Not on file  . Number of children: 1  . Years of education: COLLEGE1  . Highest education level: Not on file  Occupational History  . Occupation: HOUSEWIFE    Employer: UNEMPLOYED  Tobacco Use  . Smoking status: Never Smoker  . Smokeless tobacco: Never Used  Substance and Sexual Activity  . Alcohol use: No  . Drug use: No  . Sexual activity: Not on file  Other Topics Concern  . Not on file  Social History Narrative   Patient is right handed.   Patient drinks caffeine occasionally.   Social Determinants of Health   Financial Resource Strain: Not on file  Food Insecurity: Not on file  Transportation Needs: Not on file  Physical Activity:  Not on file  Stress: Not on file  Social Connections: Not on file   Past Surgical History:  Procedure Laterality Date  .  kidney stones    . ABDOMINAL ADHESION SURGERY    . ABDOMINAL HYSTERECTOMY  1995   partial- hemmoraged after surgery-stitch came loose  . APPENDECTOMY  1993   hemmoraged after surgery- stitch came loose  . CERVICAL DISC SURGERY  06/04/2001   with fusion  . CHOLECYSTECTOMY  1985  . colonoscopy    . COLONOSCOPY    . CYSTOSCOPY    . FOOT SURGERY     lt  . jj stent  07/2002   with ureteroscopy, cystoscopy and then removal of stent in office  . LITHOTRIPSY    . LYMPH NODE BIOPSY Right 03/06/2014   Procedure: right groin LYMPH NODE BIOPSY;  Surgeon: Merrie Roof, MD;  Location: [facility];  Service: General;   Laterality: Right;  . LYMPHADENECTOMY N/A 12/29/2015   Procedure: RETROPERITONEAL LYMPHADENECTOMY;  Surgeon: Alexis Frock, MD;  Location: [facility];  Service: Urology;  Laterality: N/A;  . NECK SURGERY  2002  . ROBOT ASSISTED LAPAROSCOPIC NEPHRECTOMY Left 12/29/2015   Procedure: XI ROBOTIC ASSISTED LEFT LAPAROSCOPIC NEPHRECTOMY;  Surgeon: Alexis Frock, MD;  Location: [facility];  Service: Urology;  Laterality: Left;  . TONSILLECTOMY  1976   Past Surgical History:  Procedure Laterality Date  .  kidney stones    . ABDOMINAL ADHESION SURGERY    . ABDOMINAL HYSTERECTOMY  1995   partial- hemmoraged after surgery-stitch came loose  . APPENDECTOMY  1993   hemmoraged after surgery- stitch came loose  . CERVICAL DISC SURGERY  06/04/2001   with fusion  . CHOLECYSTECTOMY  1985  . colonoscopy    . COLONOSCOPY    . CYSTOSCOPY    . FOOT SURGERY     lt  . jj stent  07/2002   with ureteroscopy, cystoscopy and then removal of stent in office  . LITHOTRIPSY    . LYMPH NODE BIOPSY Right 03/06/2014   Procedure: right groin LYMPH NODE BIOPSY;  Surgeon: Merrie Roof, MD;  Location: [facility];  Service: General;  Laterality: Right;  . LYMPHADENECTOMY N/A 12/29/2015   Procedure: RETROPERITONEAL LYMPHADENECTOMY;  Surgeon: Alexis Frock, MD;  Location: [facility];  Service: Urology;  Laterality: N/A;  . NECK SURGERY  2002  . ROBOT ASSISTED LAPAROSCOPIC NEPHRECTOMY Left 12/29/2015   Procedure: XI ROBOTIC ASSISTED LEFT LAPAROSCOPIC NEPHRECTOMY;  Surgeon: Alexis Frock, MD;  Location[facility];  Service: Urology;  Laterality: Left;  . TONSILLECTOMY  1976   Past Medical History:  Diagnosis Date  . Anxiety   . Arthritis   . Asthma   . Bipolar 2 disorder ([place])    pt stated, "I have Bipolar 2 and it is remission"  . Bipolar affective ([place])   . Calcifying tendinitis of shoulder   . Carpal tunnel syndrome   . Cervical facet syndrome   . Chronic pain syndrome   . Complication of anesthesia   .  Depression   . Diverticulosis   . Falls   . Fibromyalgia   . 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 Full dentures   . Gout   . Headache(784.0)    migraines  . Herpesviral infection   . Hypertension   . IBS (irritable bowel syndrome)    with diarrhea  . Lymphadenopathy   . Myalgia and myositis, unspecified   . PAT (  paroxysmal atrial tachycardia) (H[facility])    asymptomatic 10 beat run on event monitor 09/2020  . PONV (postoperative nausea and vomiting)   . PTSD (post-traumatic stress disorder)   . Renal calculi   . Restless legs syndrome (RLS)   . Thoracic radiculopathy   . Vitamin D deficiency   . Wears glasses    There were no vitals taken for this visit.  Opioid Risk Score:   Fall Risk Score:  `1  Depression screen PHQ 2/9  Depression screen [facility] 2/9 04/09/2020 02/04/2020 10/06/2019 04/30/2019 03/06/2019 01/07/2019 11/21/2018  Decreased Interest 1 1 1 1 1 1 1   Down, Depressed, Hopeless 1 1 1 1 1 1 1   PHQ - 2 Score 2 2 2 2 2 2 2   Altered sleeping - - - - - - 1  Tired, decreased energy - - - - - - 2  Change in appetite - - - - - - 1  Feeling bad or failure about yourself  - - - - - - 0  Trouble concentrating - - - - - - 1  Moving slowly or fidgety/restless - - - - - - 1  Suicidal thoughts - - - - - - 0  PHQ-9 Score - - - - - - 8  Difficult doing work/chores - - - - - - Somewhat difficult  Some recent data might be hidden   Review of Systems  Musculoskeletal: Positive for arthralgias, back pain, gait problem and neck pain.       Objective:   Physical Exam Vitals and nursing note reviewed.  Constitutional:      Appearance: Normal appearance.  Cardiovascular:     Rate and Rhythm: Normal rate and regular rhythm.     Pulses: Normal pulses.     Heart sounds: Normal heart sounds.  Pulmonary:     Effort: Pulmonary effort is normal.     Breath sounds: Normal breath sounds.  Musculoskeletal:     Cervical back: Normal range of motion and neck  supple.     Comments: Normal Muscle Bulk and Muscle Testing Reveals:  Upper Extremities: Full ROM and Muscle Strength 5/5 Bilateral AC Joint Tenderness Thoracic Paraspinal Tenderness: T-7-T-9  Lumbar Paraspinal Tenderness: L-3-L-5 Lower Extremities: Full ROM and Muscle Strength 5/5 Arises from Table with ease Narrow Based Gait   Skin:    General: Skin is warm and dry.  Neurological:     Mental Status: She is alert and oriented to person, place, and time.  Psychiatric:        Mood and Affect: Mood normal.        Behavior: Behavior normal.           Assessment & Plan:  1.History of fibromyalgia with myofascial pain and multiple trigger points.Continue with Heat and exercise Regime. Continue withcurrent medication regimen withgabapentin.10/05/2020 2. Chronic migraine headaches.Continue to Monitor. S/P Botox.on 05/30/2017.10/05/2020 3. Midline Low Back Pain/ Lumbar Spondylosis/Lumbar degenerative disk disease, L4-5/ Lumbar Radiculitis: Continue with HEP and current medication regimen. Refilled: Fentanyl Patch33mcg one patch every three days #10 and ContinueHydrocodone 5/325 mg one tablet every8hours as needed #80.Marland KitchenContinue with slow weaning of Hydrocodone. 10/05/2020 4. 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 Urology Following.10/05/2020. 5. Right CTS: Continue to wear Wrist stabilizer.10/05/2020 6. Cervicalgia/ Cervical RadiculitisCervical Spondylosis: Continuecurrent medication regiment withGabapentin. Continue with HEP and Continue to monitor.10/05/2020 7. Muscle Spasm: Continuecurrent medication regimen withFlexeril as needed.10/05/2020 9. Bilateral Greater Trochanter Bursitis: Continue to alternate with ice and heat therapy. Continue HEP as  Tolerated. Continue to monitor.10/05/2020 10. Bilateral Knee Pain: No complaints today. S/P Knee Injection by Dr Lajoyce Corners on 02/23/2020. Orthopedics Following.Continue to  Monitor.10/05/2020   F/U in 1 month

## 2020-10-07 ENCOUNTER — Ambulatory Visit: Payer: Medicare Other | Admitting: Neurology

## 2020-10-07 ENCOUNTER — Other Ambulatory Visit: Payer: Self-pay

## 2020-10-07 ENCOUNTER — Ambulatory Visit (HOSPITAL_COMMUNITY): Payer: Medicare Other | Attending: Internal Medicine

## 2020-10-07 DIAGNOSIS — R600 Localized edema: Secondary | ICD-10-CM | POA: Insufficient documentation

## 2020-10-07 LAB — ECHOCARDIOGRAM COMPLETE
Area-P 1/2: 2.99 cm2
S' Lateral: 2.5 cm

## 2020-10-07 MED ORDER — PERFLUTREN LIPID MICROSPHERE
1.0000 mL | INTRAVENOUS | Status: AC | PRN
  Administered 2020-10-07: 1 mL via INTRAVENOUS

## 2020-10-27 ENCOUNTER — Telehealth: Payer: Self-pay

## 2020-10-27 ENCOUNTER — Encounter: Payer: Medicare Other | Admitting: Physical Medicine & Rehabilitation

## 2020-10-27 ENCOUNTER — Telehealth: Payer: Self-pay | Admitting: Physician Assistant

## 2020-10-27 NOTE — Telephone Encounter (Signed)
Patty Salas called back. She is scheduled for COVID testing.

## 2020-10-27 NOTE — Telephone Encounter (Signed)
Patient called with COVID symptoms, advised to go to urgent care, also provided the COVID testing number.

## 2020-10-28 ENCOUNTER — Other Ambulatory Visit: Payer: Medicare Other

## 2020-10-28 ENCOUNTER — Other Ambulatory Visit: Payer: Self-pay

## 2020-10-28 ENCOUNTER — Telehealth: Payer: Self-pay | Admitting: Physician Assistant

## 2020-10-28 DIAGNOSIS — Z20822 Contact with and (suspected) exposure to covid-19: Secondary | ICD-10-CM

## 2020-10-28 NOTE — Telephone Encounter (Signed)
patient called to advise she had her covid PCR test today. she is contacting urgent care for tretment. thank you. patient has a telehealth appt on 11/02/20

## 2020-10-28 NOTE — Telephone Encounter (Signed)
done

## 2020-10-29 LAB — SARS-COV-2, NAA 2 DAY TAT

## 2020-10-29 LAB — NOVEL CORONAVIRUS, NAA: SARS-CoV-2, NAA: NOT DETECTED

## 2020-11-02 ENCOUNTER — Other Ambulatory Visit: Payer: Self-pay

## 2020-11-02 ENCOUNTER — Encounter: Payer: Self-pay | Admitting: Physician Assistant

## 2020-11-02 ENCOUNTER — Ambulatory Visit (INDEPENDENT_AMBULATORY_CARE_PROVIDER_SITE_OTHER): Payer: Medicare Other | Admitting: Physician Assistant

## 2020-11-02 VITALS — BP 133/77 | HR 91 | Temp 99.9°F | Ht 64.0 in | Wt 151.0 lb

## 2020-11-02 DIAGNOSIS — R062 Wheezing: Secondary | ICD-10-CM | POA: Diagnosis not present

## 2020-11-02 DIAGNOSIS — J069 Acute upper respiratory infection, unspecified: Secondary | ICD-10-CM | POA: Diagnosis not present

## 2020-11-02 DIAGNOSIS — I10 Essential (primary) hypertension: Secondary | ICD-10-CM | POA: Diagnosis not present

## 2020-11-02 MED ORDER — EPINEPHRINE 0.3 MG/0.3ML IJ SOAJ: 0.3000 mg | INTRAMUSCULAR | 0 refills | Status: DC | PRN

## 2020-11-02 MED ORDER — ALBUTEROL SULFATE HFA 108 (90 BASE) MCG/ACT IN AERS: 2.0000 | INHALATION_SPRAY | Freq: Four times a day (QID) | RESPIRATORY_TRACT | 1 refills | Status: DC | PRN

## 2020-11-02 NOTE — Progress Notes (Signed)
Telehealth office visit note for Patty Reid, PA-C- at Primary Care at [facility]   I connected with current patient today by telephone and verified that I am speaking with the correct person   . Location of the patient: Home . Location of the provider: Office - This visit type was conducted due to national recommendations for restrictions regarding the COVID-19 Pandemic (e.g. social distancing) in an effort to limit this patient's exposure and mitigate transmission in our community.    - No physical exam could be performed with this format, beyond that communicated to [place] by the patient/ family members as noted.   - Additionally my office staff/ schedulers were to discuss with the patient that there may be a monetary charge related to this service, depending on their medical insurance.  My understanding is that patient understood and consented to proceed.     _________________________________________________________________________________   History of Present Illness: Patient calls in with c/o wheezing, cough especially when laying down, chest congestion, scratchy throat, and fever (T: 99.9). This morning had some diarrhea but does have IBS diarrhea dominant. Symptoms started 10/27/2020 (6 days ago) and went for Covid testing 10/28/20 which resulted negative. Patient expressed/described at length medical history and allergy history.  In the past has tolerated levofloxacin for bronchitis without issues. Patient wants to take the least amount of medications needed and plans to continue to wean off narcotics. Sees various specialists. Of note, patient difficult to follow, goes from one subject to another with intermittent interruptions.      No flowsheet data found.  Depression screen [facility] 2/9 10/05/2020 04/09/2020 02/04/2020 10/06/2019 04/30/2019  Decreased Interest 1 1 1 1 1   Down, Depressed, Hopeless - 1 1 1 1   PHQ - 2 Score 1 2 2 2 2   Altered sleeping - - - - -  Tired, decreased  energy - - - - -  Change in appetite - - - - -  Feeling bad or failure about yourself  - - - - -  Trouble concentrating - - - - -  Moving slowly or fidgety/restless - - - - -  Suicidal thoughts - - - - -  PHQ-9 Score - - - - -  Difficult doing work/chores - - - - -  Some recent data might be hidden      Impression and Recommendations:     1. Wheezing   2. Upper respiratory tract infection, unspecified type     Upper respiratory tract infection, unspecified type, Wheezing: -Symptoms have been ongoing for 6 days and recent Covid 19 test resulted negative.  Advised patient to come by the office for influenza testing to rule out infection.  Patient prefers to wait on antibiotic therapy which is reasonable. -Recommend to continue home supportive care, use a humidifier, drink warm liquids. Will send refill of albuterol to help with wheezing. Recommend to monitor symptoms especially for shortness of breath, chest pain or confusion . -If symptoms fail to improve or worsen and flu test negative, recommend to consider antibiotic therapy.  Primary hypertension: -BP fairly controlled with diet. -Reviewed recent cardiology consult. -Last CMP: renal function wnl's   - As part of my medical decision making, I reviewed the following data within the [place] History obtained from pt /family, CMA notes reviewed and incorporated if applicable, Labs reviewed, Radiograph/ tests reviewed if applicable and OV notes from prior OV's with me, as well as any other specialists she/he has seen since  seeing me last, were all reviewed and used in my medical decision making process today.    - Additionally, when appropriate, discussion had with patient regarding our treatment plan, and their biases/concerns about that plan were used in my medical decision making today.    - The patient agreed with the plan and demonstrated an understanding of the instructions.   No barriers to understanding were  identified.     - The patient was advised to call back or seek an in-person evaluation if the symptoms worsen or if the condition fails to improve as anticipated.   Return in about 4 months (around 03/02/2021) for CPE and FBW .    No orders of the defined types were placed in this encounter.   Meds ordered this encounter  Medications  . albuterol (VENTOLIN HFA) 108 (90 Base) MCG/ACT inhaler    Sig: Inhale 2 puffs into the lungs every 6 (six) hours as needed for wheezing.    Dispense:  18 g    Refill:  1    Order Specific Question:   Supervising Provider    Answer:   Beatrice Lecher D [[id number]]  . EPINEPHrine 0.3 mg/0.3 mL IJ SOAJ injection    Sig: Inject 0.3 mg into the muscle as needed for anaphylaxis.    Dispense:  2 each    Refill:  0    Order Specific Question:   Supervising Provider    Answer:   Beatrice Lecher D [[id number]]    Medications Discontinued During This Encounter  Medication Reason  . albuterol (PROVENTIL HFA;VENTOLIN HFA) 108 (90 Base) MCG/ACT inhaler Reorder  . EPINEPHrine 0.3 mg/0.3 mL IJ SOAJ injection Reorder       Time spent on visit including pre-visit chart review and post-visit care was 30 minutes.   Note:  This note was prepared with assistance of Dragon voice recognition software. Occasional wrong-word or sound-a-like substitutions may have occurred due to the inherent limitations of voice recognition software.    __________________________________________________________________________________     Patient Care Team    Relationship Specialty Notifications Start End  Patty Salas, [place] PCP - General   02/01/20   Richmond Campbell, MD Consulting Physician Gastroenterology  11/11/14   Festus Aloe, MD Consulting Physician Urology  12/07/15   Alexis Frock, MD Consulting Physician Urology  12/07/15   Bayard Hugger, NP Nurse Practitioner Physical Medicine and Rehabilitation  08/10/17   Meredith Staggers, MD Consulting Physician Physical  Medicine and Rehabilitation  08/10/17    Comment: Pain specialist.  Is where pt gets her narcotics from  Kathrynn Ducking, MD Consulting Physician Neurology  08/10/17   Wallene Huh, DPM Consulting Physician Podiatry  08/28/17   Ricard Dillon, MD  Psychiatry  08/28/17    Comment: Treats patient for her bipolar, generalized anxiety disorder etc.  Thea Gist  Physical Medicine and Rehabilitation  08/28/17   Sueanne Margarita, MD Consulting Physician Cardiology  08/28/17    Comment: per pt- she had been seen by her int he past.  I do not see those OV notes though.   Newt Minion, MD Consulting Physician Orthopedic Surgery  08/28/17   Calton Dach, MD Referring Physician Optometry  08/28/17   Heath Lark, MD Consulting Physician Hematology and Oncology  08/28/17      -Vitals obtained; medications/ allergies reconciled;  personal medical, social, Sx etc.histories were updated by CMA, reviewed by me and are reflected in chart   Patient Active Problem List  Diagnosis Date Noted  . PAT (paroxysmal atrial tachycardia) ([place])   . Renal cell cancer ([place]) 11/21/2018  . Vitamin D deficiency 08/28/2017  . Hypertension 08/28/2017  . Herpes simplex- oral lesions 08/10/2017  . Bipolar 1 disorder, mixed, moderate ([place]) 08/10/2017  . History of anaphylaxis 08/10/2017  . Postmenopausal syndrome 08/10/2017  . 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 History of kidney cancer 02/22/2016  . Renal mass 12/29/2015  . Left lateral epicondylitis 09/06/2015  . De Quervain's tenosynovitis, right 04/24/2014  . Enlarged lymph node 02/12/2014  . BPPV (benign paroxysmal positional vertigo) 12/05/2013  . Cervical spondylosis without myelopathy 09/17/2013  . Post concussion syndrome 09/17/2013  . Nephrolithiasis 02/11/2013  . Carpal tunnel syndrome 01/01/2013  . Wrist tendonitis 01/10/2012  . Fibromyalgia/myofascial pain syndrome 12/06/2011  . Lumbar  spondylosis 12/06/2011  . Lumbar degenerative disc disease 12/06/2011  . Depression with anxiety 12/06/2011  . Migraine aura, persistent, intractable 12/06/2011     Current Meds  Medication Sig  . acetaminophen (TYLENOL) 500 MG tablet Take 500 mg by mouth every 6 (six) hours as needed for moderate pain.  Marland Kitchen acyclovir (ZOVIRAX) 800 MG tablet Take 800 mg by mouth as needed.  Marland Kitchen almotriptan (AXERT) 12.5 MG tablet TAKE 1 TABLET (12.5 MG TOTAL) BY MOUTH AS NEEDED FOR MIGRAINE (MAX 2 TABS PER WEEK). THIS IS 90-DAY RX.  . bisacodyl (DULCOLAX) 5 MG EC tablet Take 10 mg by mouth daily as needed for mild constipation.   . cyclobenzaprine (FLEXERIL) 10 MG tablet Take 1 tablet (10 mg total) by mouth 3 (three) times daily.  Marland Kitchen docusate sodium (COLACE) 100 MG capsule Take 100 mg by mouth daily as needed for mild constipation.   . fentaNYL (DURAGESIC) 12 MCG/HR Place 1 patch onto the skin every 3 (three) days.  . fexofenadine (ALLEGRA) 180 MG tablet Take 1 tablet (180 mg total) by mouth daily.  Marland Kitchen gabapentin (NEURONTIN) 300 MG capsule Take 600 mg by mouth at bedtime.  Marland Kitchen HYDROcodone-acetaminophen (NORCO/VICODIN) 5-325 MG tablet Take 1 tablet by mouth every 8 (eight) hours as needed for moderate pain.  . hyoscyamine (LEVSIN, ANASPAZ) 0.125 MG tablet Take 0.125 mg by mouth every 4 (four) hours as needed for bladder spasms or cramping.   . lidocaine (XYLOCAINE) 2 % solution Use as directed 20 mLs in the mouth or throat daily as needed for mouth pain.   Marland Kitchen LORazepam (ATIVAN) 1 MG tablet Take 1 mg by mouth 3 (three) times daily.  . promethazine (PHENERGAN) 25 MG tablet TAKE 0.5 TABLETS (12.5 MG TOTAL) BY MOUTH EVERY 6 (SIX) HOURS AS NEEDED FOR NAUSEA OR VOMITING.  . Trospium Chloride 60 MG CP24 Take 1 capsule by mouth every morning.   . [DISCONTINUED] albuterol (PROVENTIL HFA;VENTOLIN HFA) 108 (90 Base) MCG/ACT inhaler Inhale 2 puffs into the lungs every 6 (six) hours as needed for wheezing or shortness of breath.  .  [DISCONTINUED] EPINEPHrine 0.3 mg/0.3 mL IJ SOAJ injection Inject 0.3 mLs (0.3 mg total) into the muscle as needed for anaphylaxis. **PATIENT WILL NEED APT FOR ANY FURTHER REFILLS**     Allergies:  Allergies  Allergen Reactions  . Cymbalta [Duloxetine Hcl] Anaphylaxis    Suicidal if in combination w/ Axert  . Divalproex Sodium Anaphylaxis  . Duract [Bromfenac] Anaphylaxis  . Hydrochlorothiazide Anaphylaxis    Pt loses facial movement uncontrolled;   . Imitrex [Sumatriptan Succinate] Anaphylaxis  . Latex Anaphylaxis  . Mellaril Anaphylaxis  . Olanzapine Anaphylaxis  . Penicillins  Anaphylaxis  . Topamax Anaphylaxis and Other (See Comments)    Confusion, tremor, blurred vision  . Aripiprazole Other (See Comments)    Stiffened muscles  . Aspirin Hives and Itching  . Dalmane [Flurazepam Hcl] Other (See Comments)    Stiffened all muscles  . Darifenacin Hydrobromide Er Hives    Hives on back and looked like sunburn  . Metoclopramide Hcl Other (See Comments)    headache  . Seroquel [Quetiapine Fumerate] Other (See Comments)    extreme fatigue and bad dreams  . Statins Hives  . Stelazine Other (See Comments)    Stiffened muscles  . Thorazine [Chlorpromazine Hcl] Other (See Comments)    Stiffened muscles  . Abilify [Aripiprazole]     Stiffened muscles, extrapyramidal effects all over body.   . Alka-Seltzer [Aspirin Effervescent]     Mouth ulcers, swallowing problems.   . Allopurinol Hives  . Biofreeze [Menthol (Topical Analgesic)] Hives and Itching  . Capzasin [Capsaicin] Itching    Redness   . Clindamycin/Lincomycin     Bad reflux and loose stools   . Clove Oil   . Colchicine Hives  . Cough Drops [Benzocaine]     orthicoat sprays containing menthol or lemon flavor--breaks inside of mouth out, red rash, mouth ulcers.   . Diflucan [Fluconazole] Hives and Itching  . Garlic   . Iohexol      Code: HIVES, Desc: pt broke out in red rash and hives after CT injection on  12/07/09.-pt needs 13 hr prep kit, Onset Date: [id number]   . Ivp Dye [Iodinated Diagnostic Agents]   . Lyrica [Pregabalin]   . Metamucil [Psyllium]     Mouth ulcers, swallowing problems.  Merril Abbe [Polyethylene Glycol] Nausea And Vomiting  . Myrbetriq [Mirabegron]     swelling  . Nsaids     Mouth ulcers, swelling of mouth.   . Other Itching and Other (See Comments)    Dust, mold, feathers, wool, flannel, cologne, chlorox, household cleaning products, scented candles, cinnamon, ivory soap, paints, sun sentitive, acidic fruits, ( lemons, limes, strawbery, tartrazine yellow#5 and 6 in foods, MSG food additives, sudiium lauryl sulates, hot peppers, spearmint, wintergreen peppermint, mentol, orange, jalapenos. ---headache, breathing, welps, canker sores inside mouth, uticaria  . Potassium-Containing Compounds     Undiluted through IV. --Pain.   . Prednisone     No  Sleep at night.   . Reglan [Metoclopramide]     Headaches.   . Renografin [Diatrizoate]     welps   . Risperdal [Risperidone] Other (See Comments)    Too sedating.   Marland Kitchen Seroquel [Quetiapine Fumarate]     Bad dreams, too sedating.  . Simvastatin Other (See Comments)    Paranoid affect, muscle spasms.   Marland Kitchen Urocit - K [Potassium Citrate]   . Zyprexa [Olanzapine]   . Avelox [Moxifloxacin Hcl In Nacl] Nausea And Vomiting  . Barium-Containing Compounds Rash  . Diclofenac Rash  . Doxycycline Nausea Only    Stomach upset.   Marland Kitchen E-Mycin [Erythromycin Base] Nausea Only  . Fiorinal [Butalbital-Aspirin-Caffeine] Itching and Rash    Welps,  . Flagyl [Metronidazole Hcl] Nausea And Vomiting  . Lamictal [Lamotrigine] Rash  . Pregabalin     Blurry vision, muscle stiffness   . Risperidone Other (See Comments)    Too sedating  . Toradol [Ketorolac Tromethamine] Itching and Rash     ROS:  See above HPI for pertinent positives and negatives   Objective:   Blood pressure 133/77, pulse 91, temperature 99.9 F (37.7  C), height 5' 4"   (1.626 m), weight 151 lb (68.5 kg).  (if some vitals are omitted, this means that patient was UNABLE to obtain them.) General: A & O * 3; sounds in no acute distress Respiratory: speaking in full sentences, no conversational dyspnea Psych: insight appears good, mood- appears full

## 2020-11-03 ENCOUNTER — Encounter: Payer: Medicare Other | Attending: Physical Medicine & Rehabilitation | Admitting: Registered Nurse

## 2020-11-03 ENCOUNTER — Telehealth: Payer: Self-pay | Admitting: Physician Assistant

## 2020-11-03 ENCOUNTER — Other Ambulatory Visit: Payer: Medicare Other

## 2020-11-03 ENCOUNTER — Encounter: Payer: Self-pay | Admitting: Registered Nurse

## 2020-11-03 VITALS — BP 128/70 | HR 96 | Temp 99.7°F | Ht 64.0 in | Wt 151.0 lb

## 2020-11-03 DIAGNOSIS — M797 Fibromyalgia: Secondary | ICD-10-CM | POA: Insufficient documentation

## 2020-11-03 DIAGNOSIS — Z79891 Long term (current) use of opiate analgesic: Secondary | ICD-10-CM | POA: Insufficient documentation

## 2020-11-03 DIAGNOSIS — M5412 Radiculopathy, cervical region: Secondary | ICD-10-CM | POA: Diagnosis not present

## 2020-11-03 DIAGNOSIS — G894 Chronic pain syndrome: Secondary | ICD-10-CM | POA: Insufficient documentation

## 2020-11-03 DIAGNOSIS — M47812 Spondylosis without myelopathy or radiculopathy, cervical region: Secondary | ICD-10-CM

## 2020-11-03 DIAGNOSIS — J069 Acute upper respiratory infection, unspecified: Secondary | ICD-10-CM

## 2020-11-03 DIAGNOSIS — G8929 Other chronic pain: Secondary | ICD-10-CM | POA: Insufficient documentation

## 2020-11-03 DIAGNOSIS — Z5181 Encounter for therapeutic drug level monitoring: Secondary | ICD-10-CM | POA: Insufficient documentation

## 2020-11-03 DIAGNOSIS — M542 Cervicalgia: Secondary | ICD-10-CM | POA: Diagnosis not present

## 2020-11-03 DIAGNOSIS — M7061 Trochanteric bursitis, right hip: Secondary | ICD-10-CM | POA: Insufficient documentation

## 2020-11-03 DIAGNOSIS — M7062 Trochanteric bursitis, left hip: Secondary | ICD-10-CM | POA: Insufficient documentation

## 2020-11-03 DIAGNOSIS — M546 Pain in thoracic spine: Secondary | ICD-10-CM | POA: Insufficient documentation

## 2020-11-03 DIAGNOSIS — M5416 Radiculopathy, lumbar region: Secondary | ICD-10-CM | POA: Insufficient documentation

## 2020-11-03 DIAGNOSIS — M4716 Other spondylosis with myelopathy, lumbar region: Secondary | ICD-10-CM | POA: Insufficient documentation

## 2020-11-03 MED ORDER — LEVOFLOXACIN 500 MG PO TABS: 500.0000 mg | ORAL_TABLET | Freq: Every day | ORAL | 0 refills | Status: DC

## 2020-11-03 MED ORDER — HYDROCODONE-ACETAMINOPHEN 5-325 MG PO TABS: 1.0000 | ORAL_TABLET | Freq: Three times a day (TID) | ORAL | 0 refills | Status: DC | PRN

## 2020-11-03 MED ORDER — FENTANYL 12 MCG/HR TD PT72: 1.0000 | MEDICATED_PATCH | TRANSDERMAL | 0 refills | Status: DC

## 2020-11-03 NOTE — Progress Notes (Signed)
Pt neg for flu a and b. Per Herb Grays sending Levaquin 500mg  1 po qd x 10 days to pharmacy. Patient is aware and verbalized understanding.

## 2020-11-03 NOTE — Progress Notes (Signed)
Subjective:    Patient ID: Patty Salas, female    DOB: 12-09-1955, 65 y.o.   MRN: [id number]  HPI: Patty Salas is a 65 y.o. female whose appointment was changed to a My-Chart Video Visit. She reports she has COVID symptoms with fever and chills and she has a PCP appointment today, she reports. Patty Salas agrees with My-Chart Video Visit. She states her pain is located in her neck radiating into her bilateral shoulders, upper- lower back pain radiating into her bilateral lower extremities. Also reports Bilateral hip pain. She rates her pain 10. Her current exercise regime is walking and performing stretching exercises.  Patty Salas Morphine equivalent is 28.80 MME.She is also prescribed Lorazepam by Dr. Betti Cruzeddy. We have discussed the black box warning of using opioids and benzodiazepines. I highlighted the dangers of using these drugs together and discussed the adverse events including respiratory suppression, overdose, cognitive impairment and importance of compliance with current regimen. We will continue to monitor and adjust as indicated.  She is being closely monitored and under the care of her psychiatrist.  Pain Inventory Average Pain 10 Pain Right Now 10 My pain is constant, sharp, burning, dull, stabbing, tingling and aching  In the last 24 hours, has pain interfered with the following? General activity 10 Relation with others 10 Enjoyment of life 10 What TIME of day is your pain at its worst? morning , daytime, evening and night Sleep (in general) Fair  Pain is worse with: walking, bending, sitting, standing and some activites Pain improves with: rest, heat/ice, therapy/exercise, medication and injections Relief from Meds: 6  Family History  Problem Relation Age of Onset  . Cancer Mother        stomach  . Migraines Mother   . Arthritis Mother   . Emphysema Mother   . Heart disease Father   . Hypertension Father   . Cancer Father        colon  . Dementia Father   .  Hypertension Brother   . Migraines Brother   . Hypertension Brother   . Migraines Brother   . Alcohol abuse Brother   . Bipolar disorder Brother   . Migraines Daughter   . Arthritis Maternal Grandmother   . Cancer Maternal Grandmother        stomach  . Diabetes Maternal Grandmother   . Stroke Maternal Grandmother   . Cancer Maternal Aunt        lung  . Emphysema Maternal Aunt   . Cancer Paternal Uncle        colon  . Hypertension Maternal Aunt   . Heart disease Maternal Aunt   . Cancer Paternal Uncle        lung   Social History   Socioeconomic History  . Marital status: Married    Spouse name: Not on file  . Number of children: 1  . Years of education: COLLEGE1  . Highest education level: Not on file  Occupational History  . Occupation: HOUSEWIFE    Employer: UNEMPLOYED  Tobacco Use  . Smoking status: Never Smoker  . Smokeless tobacco: Never Used  Vaping Use  . Vaping Use: Never used  Substance and Sexual Activity  . Alcohol use: No  . Drug use: No  . Sexual activity: Not on file  Other Topics Concern  . Not on file  Social History Narrative   Patient is right handed.   Patient drinks caffeine occasionally.   Social Determinants of Health   Financial  Resource Strain: Not on file  Food Insecurity: Not on file  Transportation Needs: Not on file  Physical Activity: Not on file  Stress: Not on file  Social Connections: Not on file   Past Surgical History:  Procedure Laterality Date  .  kidney stones    . ABDOMINAL ADHESION SURGERY    . ABDOMINAL HYSTERECTOMY  1995   partial- hemmoraged after surgery-stitch came loose  . APPENDECTOMY  1993   hemmoraged after surgery- stitch came loose  . CERVICAL DISC SURGERY  06/04/2001   with fusion  . CHOLECYSTECTOMY  1985  . colonoscopy    . COLONOSCOPY    . CYSTOSCOPY    . FOOT SURGERY     lt  . jj stent  07/2002   with ureteroscopy, cystoscopy and then removal of stent in office  . LITHOTRIPSY    . LYMPH  NODE BIOPSY Right 03/06/2014   Procedure: right groin LYMPH NODE BIOPSY;  Surgeon: Merrie Roof, MD;  Location: [facility];  Service: General;  Laterality: Right;  . LYMPHADENECTOMY N/A 12/29/2015   Procedure: RETROPERITONEAL LYMPHADENECTOMY;  Surgeon: Alexis Frock, MD;  Location: [facility];  Service: Urology;  Laterality: N/A;  . NECK SURGERY  2002  . ROBOT ASSISTED LAPAROSCOPIC NEPHRECTOMY Left 12/29/2015   Procedure: XI ROBOTIC ASSISTED LEFT LAPAROSCOPIC NEPHRECTOMY;  Surgeon: Alexis Frock, MD;  Location: [facility];  Service: Urology;  Laterality: Left;  . TONSILLECTOMY  1976   Past Surgical History:  Procedure Laterality Date  .  kidney stones    . ABDOMINAL ADHESION SURGERY    . ABDOMINAL HYSTERECTOMY  1995   partial- hemmoraged after surgery-stitch came loose  . APPENDECTOMY  1993   hemmoraged after surgery- stitch came loose  . CERVICAL DISC SURGERY  06/04/2001   with fusion  . CHOLECYSTECTOMY  1985  . colonoscopy    . COLONOSCOPY    . CYSTOSCOPY    . FOOT SURGERY     lt  . jj stent  07/2002   with ureteroscopy, cystoscopy and then removal of stent in office  . LITHOTRIPSY    . LYMPH NODE BIOPSY Right 03/06/2014   Procedure: right groin LYMPH NODE BIOPSY;  Surgeon: Merrie Roof, MD;  Location: [facility];  Service: General;  Laterality: Right;  . LYMPHADENECTOMY N/A 12/29/2015   Procedure: RETROPERITONEAL LYMPHADENECTOMY;  Surgeon: Alexis Frock, MD;  Location: [facility];  Service: Urology;  Laterality: N/A;  . NECK SURGERY  2002  . ROBOT ASSISTED LAPAROSCOPIC NEPHRECTOMY Left 12/29/2015   Procedure: XI ROBOTIC ASSISTED LEFT LAPAROSCOPIC NEPHRECTOMY;  Surgeon: Alexis Frock, MD;  Location: [facility];  Service: Urology;  Laterality: Left;  . TONSILLECTOMY  1976   Past Medical History:  Diagnosis Date  . Anxiety   . Arthritis   . Asthma   . Bipolar 2 disorder ([place])    pt stated, "I have Bipolar 2 and it is remission"  . Bipolar affective ([place])    . Calcifying tendinitis of shoulder   . Carpal tunnel syndrome   . Cervical facet syndrome   . Chronic pain syndrome   . Complication of anesthesia   . Depression   . Diverticulosis   . Falls   . Fibromyalgia   . 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 Full dentures   . Gout   . Headache(784.0)    migraines  . Herpesviral infection   . Hypertension   . IBS (irritable bowel  syndrome)    with diarrhea  . Lymphadenopathy   . Myalgia and myositis, unspecified   . PAT (paroxysmal atrial tachycardia) ([facility])    asymptomatic 10 beat run on event monitor 09/2020  . PONV (postoperative nausea and vomiting)   . PTSD (post-traumatic stress disorder)   . Renal calculi   . Restless legs syndrome (RLS)   . Thoracic radiculopathy   . Vitamin D deficiency   . Wears glasses    BP 128/70   Pulse 96   Temp 99.7 F (37.6 C)   Ht 5\' 4"  (1.626 m)   Wt 151 lb (68.5 kg)   BMI 25.92 kg/m   Opioid Risk Score:   Fall Risk Score:  `1  Depression screen PHQ 2/9  Depression screen [facility] 2/9 10/05/2020 04/09/2020 02/04/2020 10/06/2019 04/30/2019 03/06/2019 01/07/2019  Decreased Interest 1 1 1 1 1 1 1   Down, Depressed, Hopeless - 1 1 1 1 1 1   PHQ - 2 Score 1 2 2 2 2 2 2   Altered sleeping - - - - - - -  Tired, decreased energy - - - - - - -  Change in appetite - - - - - - -  Feeling bad or failure about yourself  - - - - - - -  Trouble concentrating - - - - - - -  Moving slowly or fidgety/restless - - - - - - -  Suicidal thoughts - - - - - - -  PHQ-9 Score - - - - - - -  Difficult doing work/chores - - - - - - -  Some recent data might be hidden    Review of Systems  Constitutional: Negative.   HENT: Negative.   Eyes: Negative.   Respiratory: Negative.   Cardiovascular: Negative.   Gastrointestinal: Negative.   Endocrine: Negative.   Genitourinary: Negative.   Musculoskeletal: Positive for arthralgias, back pain, myalgias, neck pain and neck stiffness.  Skin:  Negative.   Allergic/Immunologic: Negative.   Neurological: Negative.   Hematological: Negative.   Psychiatric/Behavioral: Positive for dysphoric mood. The patient is nervous/anxious.   All other systems reviewed and are negative.      Objective:   Physical Exam Vitals and nursing note reviewed.  Musculoskeletal:     Comments: No Physical Exam Performed: My Chart Video Visit           Assessment & Plan:  1.History of fibromyalgia with myofascial pain and multiple trigger points.Continue with Heat and exercise Regime. Continue withcurrent medication regimen withgabapentin.11/03/2020 2. Chronic migraine headaches.Continue to Monitor. S/P Botox.on 05/30/2017.11/03/2020 3. Midline Low Back Pain/ Lumbar Spondylosis/Lumbar degenerative disk disease, L4-5/ Lumbar Radiculitis: Continue with HEP and current medication regimen. Refilled: Fentanyl Patch12mcg one patch every three days #10 and ContinueHydrocodone 5/325 mg one tablet every8hours as needed #80.Marland KitchenContinue with slow weaning of Hydrocodone. 11/03/2020 4. 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 Urology Following.11/03/2020. 5. Right CTS: Continue to wear Wrist stabilizer.11/03/2020 6. Cervicalgia/ Cervical RadiculitisCervical Spondylosis: Continuecurrent medication regiment withGabapentin. Continue with HEP and Continue to monitor.11/03/2020 7. Muscle Spasm: Continuecurrent medication regimen withFlexeril as needed.11/03/2020 9. Bilateral Greater Trochanter Bursitis: Continue to alternate with ice and heat therapy. Continue HEP as Tolerated. Continue to monitor.11/03/2020 10. Bilateral Knee Pain:No complaints today.S/P Knee Injection by Dr Sharol Given on 02/23/2020. Orthopedics Following.Continue to Monitor.11/03/2020   F/U in 1 month My-Chart Video Visit Established Patient Location of Patient: In her Home Location of Provider: In the Office Supervising Physician: Dr  Naaman Plummer

## 2020-11-03 NOTE — Addendum Note (Signed)
Addended by: Mickel Crow on: 11/03/2020 05:13 PM   Modules accepted: Orders

## 2020-11-03 NOTE — Telephone Encounter (Signed)
Patient called in wanting to know if she will be getting an antibiotic since her flu test was negative and I told her due to the staff being busy with patients I will put in a phone note. Patient stated she would wait in parking lot for a phone call and I advised her it would more than likely be tomorrow as there are three patients waiting to be seen in office. She understood. Thanks

## 2020-11-04 ENCOUNTER — Telehealth: Payer: Self-pay | Admitting: Physician Assistant

## 2020-11-04 ENCOUNTER — Telehealth: Payer: Self-pay | Admitting: Neurology

## 2020-11-04 NOTE — Telephone Encounter (Signed)
Noted migraines.  

## 2020-11-04 NOTE — Telephone Encounter (Signed)
Patient called to thank staff (CMA, and PA)  for everything they have done and that the antibiotics we prescribed is working with no side effects.She is very appreciative for all you both have done. Thanks

## 2020-11-04 NOTE — Telephone Encounter (Signed)
Pt understands that although there may be some limitations with this type of visit, we will take all precautions to reduce any security or privacy concerns.  Pt understands that this will be treated like an in office visit and we will file with pt's insurance, and there may be a patient responsible charge related to this service.  *phone rep confirmed with pt that she has no new symptoms*

## 2020-11-04 NOTE — Telephone Encounter (Signed)
Please see NV from 11/03/2020. AS, CMA

## 2020-11-10 ENCOUNTER — Encounter: Payer: Self-pay | Admitting: Neurology

## 2020-11-10 ENCOUNTER — Telehealth (INDEPENDENT_AMBULATORY_CARE_PROVIDER_SITE_OTHER): Payer: Medicare Other | Admitting: Neurology

## 2020-11-10 ENCOUNTER — Other Ambulatory Visit: Payer: Self-pay

## 2020-11-10 DIAGNOSIS — G43519 Persistent migraine aura without cerebral infarction, intractable, without status migrainosus: Secondary | ICD-10-CM

## 2020-11-10 NOTE — Progress Notes (Signed)
    Virtual Visit via Video Note  I connected with Patty Salas on 11/10/20 at 11:15 AM EST by a video enabled telemedicine application and verified that I am speaking with the correct person using two identifiers.  Location: Patient: at her home Provider: In the office    I discussed the limitations of evaluation and management by telemedicine and the availability of in person appointments. The patient expressed understanding and agreed to proceed.  History of Present Illness: 11/10/2020 SS: Patty Salas is a 65 year old female with history of intractable migraine headache.  She has previously not tolerated Botox.  Has not been able to afford CGRP, any of them. She is allergic to 58 medications. 2-3 migraines a week, takes Axert, generic, no more than twice a week. Usually starts with phenergan 25 mg 1/2 tablet for migraine, if not helpful will take Axert.  Is really careful not to get rebound headache. Insurance won't cover, uses good rx coupon gets 24 tablets for 3 month period. Needs refill by 3/20. Sees pain management is on Fentanyl, Norco, Flexeril, Ativan. Is hopeful to one day just be on Flexeril. Often for headaches hot/cold mask works well. 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 Here today for follow-up via telephone we couldn't connect on my my chart. A lot of stress about her husband's kidney issues.   03/29/2020 SS: Patty Salas is a 65 year old female history of intractable migraine headache.  She has previously not tolerated Botox injections.She was going to be tried on Aimovig, but patient has reported latex allergy, along with 58 other allergies. Ajovy was ordered, but was too expensive now on Medicare. At this point, is having 12-14 headaches a month, limits herself to Axert twice a week. Other things she does: take a shower, wash her hair, lay down for headache relief. She takes phenergan if needed 12.5 mg as needed. She wants to try another injectable medication for migraine prevention. She is taking  oxybutynin, makes her dizziness worse, also IBS (working with urology they want her to stay on a little longer). She is trying to wean off Norco and fentanyl patch with pain management. Presents today for telephone visit.    Observations/Objective: Via telephone, is alert and oriented  Speech is clear and concise  Detailed historian   Assessment and Plan: 1. Chronic intractable migraine headache  -Continue Axert 12.5 mg tablet PRN for acute headache -Will continue Phenergan 25 mg, 1/2 tablet PRN for nausea with migraine -Cannot afford CGRP, didn't do well with Botox, sees pain management on Fentanyl, Norco, Ativan, Flexeril  -58 medication allergies  Follow Up Instructions: 1 year    I discussed the assessment and treatment plan with the patient. The patient was provided an opportunity to ask questions and all were answered. The patient agreed with the plan and demonstrated an understanding of the instructions.   The patient was advised to call back or seek an in-person evaluation if the symptoms worsen or if the condition fails to improve as anticipated.  I spent 25 minutes of face-to-face and non-face-to-face time with patient.  This included previsit chart review, lab review, study review, order entry, electronic health record documentation, patient education.  Evangeline Dakin, DNP  [facility] [address] [facility] [place], [place] [address] [phone]

## 2020-11-12 ENCOUNTER — Telehealth: Payer: Self-pay

## 2020-11-12 ENCOUNTER — Other Ambulatory Visit: Payer: Self-pay

## 2020-11-12 DIAGNOSIS — M654 Radial styloid tenosynovitis [de Quervain]: Secondary | ICD-10-CM

## 2020-11-12 MED ORDER — METHYLPREDNISOLONE 4 MG PO TBPK: ORAL_TABLET | ORAL | 0 refills | Status: DC

## 2020-11-12 NOTE — Progress Notes (Signed)
I have read the note, and I agree with the clinical assessment and plan.  Charles K Willis   

## 2020-11-12 NOTE — Telephone Encounter (Signed)
Patty Salas has requested Rx Prednisone to be called in to the phamacy. For right wrist and hand swelling with pain.   Call back ph [phone]. Please advise or prescribe.

## 2020-11-12 NOTE — Telephone Encounter (Signed)
Placed a call to Patty Salas she reports she's having right wrist pain and swelling. In the past she was prescribed Medrol dose pak for DeQuervain's tenosynovitis. With good relief noted. Medrol dose pak sent. Placed a call to Patty Salas regarding the above, she verbalizes understanding.

## 2020-11-28 ENCOUNTER — Other Ambulatory Visit: Payer: Self-pay

## 2020-11-30 ENCOUNTER — Other Ambulatory Visit: Payer: Self-pay

## 2020-11-30 ENCOUNTER — Encounter: Payer: Medicare Other | Attending: Physical Medicine & Rehabilitation | Admitting: Registered Nurse

## 2020-11-30 ENCOUNTER — Encounter: Payer: Self-pay | Admitting: Registered Nurse

## 2020-11-30 VITALS — BP 148/77 | HR 109 | Temp 99.3°F | Ht 65.0 in | Wt 157.0 lb

## 2020-11-30 DIAGNOSIS — M5412 Radiculopathy, cervical region: Secondary | ICD-10-CM | POA: Diagnosis present

## 2020-11-30 DIAGNOSIS — G8929 Other chronic pain: Secondary | ICD-10-CM | POA: Diagnosis present

## 2020-11-30 DIAGNOSIS — G894 Chronic pain syndrome: Secondary | ICD-10-CM | POA: Diagnosis present

## 2020-11-30 DIAGNOSIS — Z5181 Encounter for therapeutic drug level monitoring: Secondary | ICD-10-CM | POA: Insufficient documentation

## 2020-11-30 DIAGNOSIS — M542 Cervicalgia: Secondary | ICD-10-CM | POA: Insufficient documentation

## 2020-11-30 DIAGNOSIS — M4716 Other spondylosis with myelopathy, lumbar region: Secondary | ICD-10-CM

## 2020-11-30 DIAGNOSIS — M7061 Trochanteric bursitis, right hip: Secondary | ICD-10-CM | POA: Diagnosis present

## 2020-11-30 DIAGNOSIS — M546 Pain in thoracic spine: Secondary | ICD-10-CM | POA: Insufficient documentation

## 2020-11-30 DIAGNOSIS — M5416 Radiculopathy, lumbar region: Secondary | ICD-10-CM

## 2020-11-30 DIAGNOSIS — M797 Fibromyalgia: Secondary | ICD-10-CM | POA: Diagnosis present

## 2020-11-30 DIAGNOSIS — M7062 Trochanteric bursitis, left hip: Secondary | ICD-10-CM

## 2020-11-30 DIAGNOSIS — M7918 Myalgia, other site: Secondary | ICD-10-CM | POA: Diagnosis present

## 2020-11-30 DIAGNOSIS — M47812 Spondylosis without myelopathy or radiculopathy, cervical region: Secondary | ICD-10-CM | POA: Diagnosis present

## 2020-11-30 DIAGNOSIS — Z79891 Long term (current) use of opiate analgesic: Secondary | ICD-10-CM | POA: Diagnosis present

## 2020-11-30 MED ORDER — FENTANYL 12 MCG/HR TD PT72: 1.0000 | MEDICATED_PATCH | TRANSDERMAL | 0 refills | Status: DC

## 2020-11-30 MED ORDER — HYDROCODONE-ACETAMINOPHEN 5-325 MG PO TABS: 1.0000 | ORAL_TABLET | Freq: Three times a day (TID) | ORAL | 0 refills | Status: DC | PRN

## 2020-11-30 NOTE — Progress Notes (Signed)
Subjective:    Patient ID: Patty Salas, female    DOB: [birth year], 65 y.o.   MRN: [id number]  HPI: Patty Salas is a 65 y.o. female who returns for follow up appointment for chronic pain and medication refill. She states her  pain is located in her neck radiating into her bilateral shoulders, mid- lower back pain radiating into her bilateral lower extremities and bilateral hip pain. She rates her pain 10. Her current exercise regime is walking and performing stretching exercises.  Patty Salas Morphine equivalent is 42.13 MME. She  is also prescribed Lorazepam  by Dr. Reece Levy. We have discussed the black box warning of using opioids and benzodiazepines. I highlighted the dangers of using these drugs together and discussed the adverse events including respiratory suppression, overdose, cognitive impairment and importance of compliance with current regimen. We will continue to monitor and adjust as indicated.  She  is being closely monitored and under the care of her psychiatrist.  Last UDS was Performed on 08/05/2020, it was consistent.    Pain Inventory Average Pain 10 Pain Right Now 10 My pain is constant, sharp, burning, stabbing, tingling and aching  In the last 24 hours, has pain interfered with the following? General activity 10 Relation with others 10 Enjoyment of life 10 What TIME of day is your pain at its worst? morning , daytime, evening and night Sleep (in general) Fair  Pain is worse with: walking, bending, sitting, inactivity, standing and some activites Pain improves with: rest, heat/ice, therapy/exercise, medication and injections Relief from Meds: 5  Family History  Problem Relation Age of Onset  . Cancer Mother        stomach  . Migraines Mother   . Arthritis Mother   . Emphysema Mother   . Heart disease Father   . Hypertension Father   . Cancer Father        colon  . Dementia Father   . Hypertension Brother   . Migraines Brother   . Hypertension Brother    . Migraines Brother   . Alcohol abuse Brother   . Bipolar disorder Brother   . Migraines Daughter   . Arthritis Maternal Grandmother   . Cancer Maternal Grandmother        stomach  . Diabetes Maternal Grandmother   . Stroke Maternal Grandmother   . Cancer Maternal Aunt        lung  . Emphysema Maternal Aunt   . Cancer Paternal Uncle        colon  . Hypertension Maternal Aunt   . Heart disease Maternal Aunt   . Cancer Paternal Uncle        lung   Social History   Socioeconomic History  . Marital status: Married    Spouse name: Not on file  . Number of children: 1  . Years of education: COLLEGE1  . Highest education level: Not on file  Occupational History  . Occupation: HOUSEWIFE    Employer: UNEMPLOYED  Tobacco Use  . Smoking status: Never Smoker  . Smokeless tobacco: Never Used  Vaping Use  . Vaping Use: Never used  Substance and Sexual Activity  . Alcohol use: No  . Drug use: No  . Sexual activity: Not on file  Other Topics Concern  . Not on file  Social History Narrative   Patient is right handed.   Patient drinks caffeine occasionally.   Social Determinants of Health   Financial Resource Strain: Not on file  Food  Insecurity: Not on file  Transportation Needs: Not on file  Physical Activity: Not on file  Stress: Not on file  Social Connections: Not on file   Past Surgical History:  Procedure Laterality Date  .  kidney stones    . ABDOMINAL ADHESION SURGERY    . ABDOMINAL HYSTERECTOMY  1995   partial- hemmoraged after surgery-stitch came loose  . APPENDECTOMY  1993   hemmoraged after surgery- stitch came loose  . CERVICAL DISC SURGERY  06/04/2001   with fusion  . CHOLECYSTECTOMY  1985  . colonoscopy    . COLONOSCOPY    . CYSTOSCOPY    . FOOT SURGERY     lt  . jj stent  07/2002   with ureteroscopy, cystoscopy and then removal of stent in office  . LITHOTRIPSY    . LYMPH NODE BIOPSY Right 03/06/2014   Procedure: right groin LYMPH NODE BIOPSY;   Surgeon: Merrie Roof, MD;  Location: [facility];  Service: General;  Laterality: Right;  . LYMPHADENECTOMY N/A 12/29/2015   Procedure: RETROPERITONEAL LYMPHADENECTOMY;  Surgeon: Alexis Frock, MD;  Location: [facility];  Service: Urology;  Laterality: N/A;  . NECK SURGERY  2002  . ROBOT ASSISTED LAPAROSCOPIC NEPHRECTOMY Left 12/29/2015   Procedure: XI ROBOTIC ASSISTED LEFT LAPAROSCOPIC NEPHRECTOMY;  Surgeon: Alexis Frock, MD;  Location: [facility];  Service: Urology;  Laterality: Left;  . TONSILLECTOMY  1976   Past Surgical History:  Procedure Laterality Date  .  kidney stones    . ABDOMINAL ADHESION SURGERY    . ABDOMINAL HYSTERECTOMY  1995   partial- hemmoraged after surgery-stitch came loose  . APPENDECTOMY  1993   hemmoraged after surgery- stitch came loose  . CERVICAL DISC SURGERY  06/04/2001   with fusion  . CHOLECYSTECTOMY  1985  . colonoscopy    . COLONOSCOPY    . CYSTOSCOPY    . FOOT SURGERY     lt  . jj stent  07/2002   with ureteroscopy, cystoscopy and then removal of stent in office  . LITHOTRIPSY    . LYMPH NODE BIOPSY Right 03/06/2014   Procedure: right groin LYMPH NODE BIOPSY;  Surgeon: Merrie Roof, MD;  Location: [facility];  Service: General;  Laterality: Right;  . LYMPHADENECTOMY N/A 12/29/2015   Procedure: RETROPERITONEAL LYMPHADENECTOMY;  Surgeon: Alexis Frock, MD;  Location: [facility];  Service: Urology;  Laterality: N/A;  . NECK SURGERY  2002  . ROBOT ASSISTED LAPAROSCOPIC NEPHRECTOMY Left 12/29/2015   Procedure: XI ROBOTIC ASSISTED LEFT LAPAROSCOPIC NEPHRECTOMY;  Surgeon: Alexis Frock, MD;  Location: [facility];  Service: Urology;  Laterality: Left;  . TONSILLECTOMY  1976   Past Medical History:  Diagnosis Date  . Anxiety   . Arthritis   . Asthma   . Bipolar 2 disorder ([place])    pt stated, "I have Bipolar 2 and it is remission"  . Bipolar affective ([place])   . Calcifying tendinitis of shoulder   . Carpal tunnel syndrome   .  Cervical facet syndrome   . Chronic pain syndrome   . Complication of anesthesia   . Depression   . Diverticulosis   . Falls   . Fibromyalgia   . 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 Full dentures   . Gout   . Headache(784.0)    migraines  . Herpesviral infection   . Hypertension   . IBS (irritable bowel syndrome)    with diarrhea  .  Lymphadenopathy   . Myalgia and myositis, unspecified   . PAT (paroxysmal atrial tachycardia) ([facility])    asymptomatic 10 beat run on event monitor 09/2020  . PONV (postoperative nausea and vomiting)   . PTSD (post-traumatic stress disorder)   . Renal calculi   . Restless legs syndrome (RLS)   . Thoracic radiculopathy   . Vitamin D deficiency   . Wears glasses    Temp 99.3 F (37.4 C)   Ht 5\' 5"  (1.651 m)   Wt 157 lb (71.2 kg)   BMI 26.13 kg/m   Opioid Risk Score:   Fall Risk Score:  `1  Depression screen PHQ 2/9  Depression screen [facility] 2/9 10/05/2020 04/09/2020 02/04/2020 10/06/2019 04/30/2019 03/06/2019 01/07/2019  Decreased Interest 1 1 1 1 1 1 1   Down, Depressed, Hopeless - 1 1 1 1 1 1   PHQ - 2 Score 1 2 2 2 2 2 2   Altered sleeping - - - - - - -  Tired, decreased energy - - - - - - -  Change in appetite - - - - - - -  Feeling bad or failure about yourself  - - - - - - -  Trouble concentrating - - - - - - -  Moving slowly or fidgety/restless - - - - - - -  Suicidal thoughts - - - - - - -  PHQ-9 Score - - - - - - -  Difficult doing work/chores - - - - - - -  Some recent data might be hidden    Review of Systems  Constitutional: Negative.   HENT: Negative.   Respiratory: Negative.   Cardiovascular: Negative.   Gastrointestinal: Negative.   Endocrine: Negative.   Genitourinary: Negative.   Musculoskeletal: Positive for arthralgias, back pain, myalgias, neck pain and neck stiffness.  Skin: Negative.   Allergic/Immunologic: Negative.   Neurological: Negative.   Hematological: Negative.    Psychiatric/Behavioral: Positive for dysphoric mood. The patient is nervous/anxious.   All other systems reviewed and are negative.      Objective:   Physical Exam Vitals and nursing note reviewed.  Constitutional:      Appearance: Normal appearance.  Neck:     Comments: Cervical Paraspinal Tenderness: C-5-C-6 Cardiovascular:     Rate and Rhythm: Normal rate and regular rhythm.     Pulses: Normal pulses.     Heart sounds: Normal heart sounds.  Pulmonary:     Effort: Pulmonary effort is normal.     Breath sounds: Normal breath sounds.  Musculoskeletal:     Cervical back: Normal range of motion and neck supple.     Comments: Normal Muscle Bulk and Muscle Testing Reveals:  Upper Extremities: Full ROM and Muscle Strength 5/5 Bilateral AC Joint Tenderness  Thoracic Paraspinal Tenderness: T-7-T-9 Lumbar Paraspinal Tenderness: L-3-L-5 Lower Extremities: Full ROM and Muscle Strength 5/5 Arises from Table with ease using cane for support Narrow Based Gait   Skin:    General: Skin is warm and dry.  Neurological:     Mental Status: She is alert and oriented to person, place, and time.  Psychiatric:        Mood and Affect: Mood normal.        Behavior: Behavior normal.           Assessment & Plan:  1.History of fibromyalgia with myofascial pain and multiple trigger points.Continue with Heat and exercise Regime. Continue withcurrent medication regimen withgabapentin.11/30/2020 2. Chronic migraine headaches.Continue to Monitor. S/P Botox.on 05/30/2017.11/30/2020 3.  Midline Low Back Pain/ Lumbar Spondylosis/Lumbar degenerative disk disease, L4-5/ Lumbar Radiculitis: Continue with HEP and current medication regimen. Refilled: Fentanyl Patch50mcg one patch every three days #10 and ContinueHydrocodone 5/325 mg one tablet every8hours as needed #80.Marland KitchenContinue with slow weaning of Hydrocodone.11/30/2020 4. History of Left Renal Mass: S/P Left Laparoscopic Nephrectomy  02/24/2016. Urology Following.11/30/2020. 5. 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 Cervicalgia/ Cervical RadiculitisCervical Spondylosis: Continuecurrent medication regiment withGabapentin. Continue with HEP and Continue to monitor.11/30/2020 7. Muscle Spasm: Continuecurrent medication regimen withFlexeril as needed.11/30/2020 9. Bilateral Greater Trochanter Bursitis: Continue to alternate with ice and heat therapy. Continue HEP as Tolerated. Continue to monitor.11/30/2020 10. Bilateral Knee Pain:No complaints today.S/P Knee Injection by Dr Sharol Given on 02/23/2020. Orthopedics Following.Continue to Monitor.11/03/2020   F/U in 1 month

## 2020-12-01 ENCOUNTER — Telehealth: Payer: Self-pay | Admitting: *Deleted

## 2020-12-01 MED ORDER — HYDROCODONE-ACETAMINOPHEN 5-325 MG PO TABS: 1.0000 | ORAL_TABLET | Freq: Three times a day (TID) | ORAL | 0 refills | Status: DC | PRN

## 2020-12-01 NOTE — Telephone Encounter (Signed)
Jonni called and her norco is on back order and [address] cannot supply.  It is available at the [address]

## 2020-12-01 NOTE — Telephone Encounter (Signed)
error 

## 2020-12-01 NOTE — Telephone Encounter (Signed)
PMP was Reviewed: Hydrocodone e-scribed, Sybil called Ms. Macaluso regarding the above.

## 2020-12-12 ENCOUNTER — Other Ambulatory Visit: Payer: Self-pay | Admitting: Neurology

## 2020-12-13 ENCOUNTER — Other Ambulatory Visit: Payer: Self-pay | Admitting: Neurology

## 2020-12-14 ENCOUNTER — Telehealth: Payer: Self-pay | Admitting: *Deleted

## 2020-12-14 NOTE — Telephone Encounter (Signed)
I attempted PA for Axert via CMM.  Received notification that we must call plan to initiate prior authorization.  I called [facility].  Completed PA via phone.  PA for Axert was approved from 10/02/2020 to 12/14/2021.  Case ID is: [id number].  A fax notification will be sent.

## 2020-12-14 NOTE — Telephone Encounter (Signed)
Received an electronic message that Axert 12.5mg  needs prior authorization.

## 2020-12-16 ENCOUNTER — Emergency Department (HOSPITAL_BASED_OUTPATIENT_CLINIC_OR_DEPARTMENT_OTHER)
Admission: EM | Admit: 2020-12-16 | Discharge: 2020-12-16 | Disposition: A | Discharge status: Home / Self Care | Payer: Medicare Other | Attending: Emergency Medicine | Admitting: Emergency Medicine

## 2020-12-16 ENCOUNTER — Emergency Department (HOSPITAL_BASED_OUTPATIENT_CLINIC_OR_DEPARTMENT_OTHER): Payer: Medicare Other

## 2020-12-16 ENCOUNTER — Other Ambulatory Visit: Payer: Self-pay

## 2020-12-16 ENCOUNTER — Encounter (HOSPITAL_BASED_OUTPATIENT_CLINIC_OR_DEPARTMENT_OTHER): Payer: Self-pay

## 2020-12-16 DIAGNOSIS — Y92810 Car as the place of occurrence of the external cause: Secondary | ICD-10-CM | POA: Insufficient documentation

## 2020-12-16 DIAGNOSIS — L989 Disorder of the skin and subcutaneous tissue, unspecified: Secondary | ICD-10-CM

## 2020-12-16 DIAGNOSIS — Z85528 Personal history of other malignant neoplasm of kidney: Secondary | ICD-10-CM | POA: Diagnosis not present

## 2020-12-16 DIAGNOSIS — I10 Essential (primary) hypertension: Secondary | ICD-10-CM | POA: Insufficient documentation

## 2020-12-16 DIAGNOSIS — Z91040 Latex allergy status: Secondary | ICD-10-CM | POA: Diagnosis not present

## 2020-12-16 DIAGNOSIS — W228XXA Striking against or struck by other objects, initial encounter: Secondary | ICD-10-CM | POA: Insufficient documentation

## 2020-12-16 DIAGNOSIS — S0083XA Contusion of other part of head, initial encounter: Secondary | ICD-10-CM | POA: Diagnosis not present

## 2020-12-16 DIAGNOSIS — J45909 Unspecified asthma, uncomplicated: Secondary | ICD-10-CM | POA: Diagnosis not present

## 2020-12-16 DIAGNOSIS — R110 Nausea: Secondary | ICD-10-CM

## 2020-12-16 DIAGNOSIS — S0990XA Unspecified injury of head, initial encounter: Secondary | ICD-10-CM | POA: Diagnosis present

## 2020-12-16 NOTE — Discharge Instructions (Addendum)
It was our pleasure to provide your ER care today - we hope that you feel better.  Rest. Drink plenty of fluids, stay well hydrated. Take your pain medication as need.   As relates the soft nodule on your right posterior scalp - follow up with primary care doctor should that area change in size/grow.   Return to ER if worse, new symptoms, new/severe pain, persistent vomiting, or other concern.

## 2020-12-16 NOTE — ED Notes (Signed)
ED Provider at bedside. 

## 2020-12-16 NOTE — ED Provider Notes (Signed)
[facility] EMERGENCY DEPT Provider Note   CSN: [id number] Arrival date & time: 12/16/20  1222     History Chief Complaint  Patient presents with  . Head Injury    Patty Salas is a 65 y.o. female.  Patient indicates hit head on edge of car door four days ago, contusion to superior right forehead. No loc, but indicates was dazed. Pt c/o pain to area, headache, nausea since. Symptoms acute onset, moderate, constant, persistent, non radiating.  Denies abd pain or distension. No bloody or bilious emesis. Denies neck pain or other injury. Skin intact. States saw eye doctor yesterday, and that exam was ok.  Denies numbness or weakness. No change in normal functional ability. Does also note hx chronic pain syndrome, and indicates is slowly trying to wean down off hydrocodone, fentanyl, and bzd meds.   The history is provided by the patient.  Head Injury Associated symptoms: nausea   Associated symptoms: no neck pain and no numbness        Past Medical History:  Diagnosis Date  . Anxiety   . Arthritis   . Asthma   . Bipolar 2 disorder ([place])    pt stated, "I have Bipolar 2 and it is remission"  . Bipolar affective ([place])   . Calcifying tendinitis of shoulder   . Carpal tunnel syndrome   . Cervical facet syndrome   . Chronic pain syndrome   . Complication of anesthesia   . Depression   . Diverticulosis   . Falls   . Fibromyalgia   . 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 Full dentures   . Gout   . Headache(784.0)    migraines  . Herpesviral infection   . Hypertension   . IBS (irritable bowel syndrome)    with diarrhea  . Lymphadenopathy   . Myalgia and myositis, unspecified   . PAT (paroxysmal atrial tachycardia) ([facility])    asymptomatic 10 beat run on event monitor 09/2020  . PONV (postoperative nausea and vomiting)   . PTSD (post-traumatic stress disorder)   . Renal calculi   . Restless legs syndrome (RLS)   .  Thoracic radiculopathy   . Vitamin D deficiency   . Wears glasses     Patient Active Problem List   Diagnosis Date Noted  . PAT (paroxysmal atrial tachycardia) ([place])   . Renal cell cancer ([place]) 11/21/2018  . Vitamin D deficiency 08/28/2017  . Hypertension 08/28/2017  . Herpes simplex- oral lesions 08/10/2017  . Bipolar 1 disorder, mixed, moderate ([place]) 08/10/2017  . History of anaphylaxis 08/10/2017  . Postmenopausal syndrome 08/10/2017  . 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 History of kidney cancer 02/22/2016  . Renal mass 12/29/2015  . Left lateral epicondylitis 09/06/2015  . De Quervain's tenosynovitis, right 04/24/2014  . Enlarged lymph node 02/12/2014  . BPPV (benign paroxysmal positional vertigo) 12/05/2013  . Cervical spondylosis without myelopathy 09/17/2013  . Post concussion syndrome 09/17/2013  . Nephrolithiasis 02/11/2013  . Carpal tunnel syndrome 01/01/2013  . Wrist tendonitis 01/10/2012  . Fibromyalgia/myofascial pain syndrome 12/06/2011  . Lumbar spondylosis 12/06/2011  . Lumbar degenerative disc disease 12/06/2011  . Depression with anxiety 12/06/2011  . Migraine aura, persistent, intractable 12/06/2011    Past Surgical History:  Procedure Laterality Date  .  kidney stones    . ABDOMINAL ADHESION SURGERY    . ABDOMINAL HYSTERECTOMY  1995   partial- hemmoraged after surgery-stitch came loose  .  APPENDECTOMY  1993   hemmoraged after surgery- stitch came loose  . CERVICAL DISC SURGERY  06/04/2001   with fusion  . CHOLECYSTECTOMY  1985  . colonoscopy    . COLONOSCOPY    . CYSTOSCOPY    . FOOT SURGERY     lt  . jj stent  07/2002   with ureteroscopy, cystoscopy and then removal of stent in office  . LITHOTRIPSY    . LYMPH NODE BIOPSY Right 03/06/2014   Procedure: right groin LYMPH NODE BIOPSY;  Surgeon: Merrie Roof, MD;  Location: [facility];  Service: General;  Laterality: Right;  .  LYMPHADENECTOMY N/A 12/29/2015   Procedure: RETROPERITONEAL LYMPHADENECTOMY;  Surgeon: Alexis Frock, MD;  Location: [facility];  Service: Urology;  Laterality: N/A;  . NECK SURGERY  2002  . ROBOT ASSISTED LAPAROSCOPIC NEPHRECTOMY Left 12/29/2015   Procedure: XI ROBOTIC ASSISTED LEFT LAPAROSCOPIC NEPHRECTOMY;  Surgeon: Alexis Frock, MD;  Location: [facility];  Service: Urology;  Laterality: Left;  . TONSILLECTOMY  1976     OB History   No obstetric history on file.     Family History  Problem Relation Age of Onset  . Cancer Mother        stomach  . Migraines Mother   . Arthritis Mother   . Emphysema Mother   . Heart disease Father   . Hypertension Father   . Cancer Father        colon  . Dementia Father   . Hypertension Brother   . Migraines Brother   . Hypertension Brother   . Migraines Brother   . Alcohol abuse Brother   . Bipolar disorder Brother   . Migraines Daughter   . Arthritis Maternal Grandmother   . Cancer Maternal Grandmother        stomach  . Diabetes Maternal Grandmother   . Stroke Maternal Grandmother   . Cancer Maternal Aunt        lung  . Emphysema Maternal Aunt   . Cancer Paternal Uncle        colon  . Hypertension Maternal Aunt   . Heart disease Maternal Aunt   . Cancer Paternal Uncle        lung    Social History   Tobacco Use  . Smoking status: Never Smoker  . Smokeless tobacco: Never Used  Vaping Use  . Vaping Use: Never used  Substance Use Topics  . Alcohol use: No  . Drug use: No    Home Medications Prior to Admission medications   Medication Sig Start Date End Date Taking? Authorizing Provider  acetaminophen (TYLENOL) 500 MG tablet Take 500 mg by mouth every 6 (six) hours as needed for moderate pain.    [provider]  acyclovir (ZOVIRAX) 800 MG tablet Take 800 mg by mouth as needed.    [provider]  albuterol (VENTOLIN HFA) 108 (90 Base) MCG/ACT inhaler Inhale 2 puffs into the lungs every 6 (six) hours as needed  for wheezing. 11/02/20   Abonza, Herb Grays, PA-C  almotriptan (AXERT) 12.5 MG tablet TAKE 1 TABLET BY MOUTH AS NEEDED FOR MIGRAINE (MAX 2 TABS PER WEEK). THIS IS 90-DAY RX. 12/13/20   Suzzanne Cloud, NP  bisacodyl (DULCOLAX) 5 MG EC tablet Take 10 mg by mouth daily as needed for mild constipation.     [provider]  cyclobenzaprine (FLEXERIL) 10 MG tablet Take 1 tablet (10 mg total) by mouth 3 (three) times daily. 08/05/20   Marcello Moores,  Vanessa Ralphs, NP  docusate sodium (COLACE) 100 MG capsule Take 100 mg by mouth daily as needed for mild constipation.     [provider]  EPINEPHrine 0.3 mg/0.3 mL IJ SOAJ injection Inject 0.3 mg into the muscle as needed for anaphylaxis. 11/02/20   Lorrene Reid, PA-C  fentaNYL (DURAGESIC) 12 MCG/HR Place 1 patch onto the skin every 3 (three) days. 11/30/20   Bayard Hugger, NP  fexofenadine (ALLEGRA) 180 MG tablet Take 1 tablet (180 mg total) by mouth daily. 11/12/18   Opalski, Neoma Laming, DO  gabapentin (NEURONTIN) 300 MG capsule Take 600 mg by mouth at bedtime.    Ricard Dillon, MD  HYDROcodone-acetaminophen (NORCO/VICODIN) 5-325 MG tablet Take 1 tablet by mouth every 8 (eight) hours as needed for moderate pain. 12/01/20   Bayard Hugger, NP  hyoscyamine (LEVSIN, ANASPAZ) 0.125 MG tablet Take 0.125 mg by mouth every 4 (four) hours as needed for bladder spasms or cramping.     [provider]  levofloxacin (LEVAQUIN) 500 MG tablet Take 1 tablet (500 mg total) by mouth daily. 11/03/20   Lorrene Reid, PA-C  lidocaine (XYLOCAINE) 2 % solution Use as directed 20 mLs in the mouth or throat daily as needed for mouth pain.  02/12/15   [provider]  LORazepam (ATIVAN) 1 MG tablet Take 1 mg by mouth 3 (three) times daily.    [provider]  methylPREDNISolone (MEDROL DOSEPAK) 4 MG TBPK tablet Use as directed 11/12/20   Bayard Hugger, NP  promethazine (PHENERGAN) 25 MG tablet TAKE 0.5 TABLETS (12.5 MG TOTAL) BY MOUTH EVERY 6 (SIX)  HOURS AS NEEDED FOR NAUSEA OR VOMITING. 09/07/20   Kathrynn Ducking, MD  Trospium Chloride 60 MG CP24 Take 1 capsule by mouth every morning.  03/28/12   Festus Aloe, MD    Allergies    Cymbalta [duloxetine hcl], Divalproex sodium, Duract [bromfenac], Hydrochlorothiazide, Imitrex [sumatriptan succinate], Latex, Mellaril, Olanzapine, Penicillins, Topamax, Aripiprazole, Aspirin, Dalmane [flurazepam hcl], Darifenacin hydrobromide er, Metoclopramide hcl, Seroquel [quetiapine fumerate], Statins, Stelazine, Thorazine [chlorpromazine hcl], Abilify [aripiprazole], Alka-seltzer [aspirin effervescent], Allopurinol, Biofreeze [menthol (topical analgesic)], Capzasin [capsaicin], Clindamycin/lincomycin, Clove oil, Colchicine, Cough drops [benzocaine], Diflucan [fluconazole], Garlic, Iohexol, Ivp dye [iodinated diagnostic agents], Lyrica [pregabalin], Metamucil [psyllium], Miralax [polyethylene glycol], Myrbetriq [mirabegron], Nsaids, Other, Potassium-containing compounds, Prednisone, Reglan [metoclopramide], Renografin [diatrizoate], Risperdal [risperidone], Seroquel [quetiapine fumarate], Simvastatin, Urocit - k [potassium citrate], Zyprexa [olanzapine], Avelox [moxifloxacin hcl in nacl], Barium-containing compounds, Diclofenac, Doxycycline, E-mycin [erythromycin base], Fiorinal [butalbital-aspirin-caffeine], Flagyl [metronidazole hcl], Lamictal [lamotrigine], Pregabalin, Risperidone, and Toradol [ketorolac tromethamine]  Review of Systems   Review of Systems  Constitutional: Negative for chills and fever.  HENT: Negative for nosebleeds and sore throat.   Eyes: Negative for pain and visual disturbance.  Respiratory: Negative for cough and shortness of breath.   Cardiovascular: Negative for chest pain.  Gastrointestinal: Positive for nausea. Negative for abdominal pain.  Genitourinary: Negative for flank pain.  Musculoskeletal: Negative for back pain and neck pain.  Skin: Negative for wound.  Neurological:  Negative for weakness and numbness.  Hematological: Does not bruise/bleed easily.  Psychiatric/Behavioral: Negative for confusion.    Physical Exam Updated Vital Signs BP (!) 109/98   Pulse (!) 103   Temp 98.4 F (36.9 C) (Oral)   Resp 18   Ht 1.651 m (5\' 5" )   Wt 71.2 kg   SpO2 98%   BMI 26.12 kg/m   Physical Exam Vitals and nursing note reviewed.  Constitutional:  Appearance: Normal appearance. She is well-developed.  HENT:     Head:     Comments: Contusion to right superior forehead area. Small non tender, non erythematous, ~ 1 cm diameter subcut lesion right posterior parietal area     Right Ear: Tympanic membrane normal.     Left Ear: Tympanic membrane normal.     Nose: Nose normal.     Mouth/Throat:     Mouth: Mucous membranes are moist.  Eyes:     General: No scleral icterus.    Conjunctiva/sclera: Conjunctivae normal.     Pupils: Pupils are equal, round, and reactive to light.  Neck:     Trachea: No tracheal deviation.     Comments: No stiffness or rigidity.  Cardiovascular:     Rate and Rhythm: Normal rate and regular rhythm.     Pulses: Normal pulses.     Heart sounds: Normal heart sounds. No murmur heard. No friction rub. No gallop.   Pulmonary:     Effort: Pulmonary effort is normal. No respiratory distress.     Breath sounds: Normal breath sounds.  Abdominal:     General: Bowel sounds are normal. There is no distension.     Palpations: Abdomen is soft.     Tenderness: There is no abdominal tenderness.  Genitourinary:    Comments: No cva tenderness.  Musculoskeletal:        General: No swelling.     Cervical back: Normal range of motion and neck supple. No rigidity. No muscular tenderness.     Comments: CTLS spine, non tender, aligned, no step off.   Skin:    General: Skin is warm and dry.     Findings: No rash.  Neurological:     Mental Status: She is alert.     Comments: Alert, speech normal. GCS 15. Motor/sens grossly intact. Steady  gait.   Psychiatric:        Mood and Affect: Mood normal.     ED Results / Procedures / Treatments   Labs (all labs ordered are listed, but only abnormal results are displayed) Labs Reviewed - No data to display  EKG None  Radiology CT Head Wo Contrast  Result Date: 12/16/2020 CLINICAL DATA:  Head trauma, minor. Additional history provided: Patient with history of kidney cancer and lymphoma, patient hit head with corner of car door on Sunday, swelling noted to right forehead, patient reports pain and vomiting. EXAM: CT HEAD WITHOUT CONTRAST TECHNIQUE: Contiguous axial images were obtained from the base of the skull through the vertex without intravenous contrast. COMPARISON:  Head CT 09/13/2013. FINDINGS: Brain: Cerebral volume is normal for age. Minimal ill-defined hypoattenuation within the cerebral white matter is nonspecific, but most often secondary to chronic small vessel ischemia. There is no acute intracranial hemorrhage. No demarcated cortical infarct. No extra-axial fluid collection. No evidence of intracranial mass. No midline shift. Partially empty sella turcica. Vascular: No hyperdense vessel.  Atherosclerotic calcifications. Skull: Normal. Negative for fracture or focal lesion. Sinuses/Orbits: Visualized orbits show no acute finding. No significant paranasal sinus disease at the imaged levels. Other: Nonspecific 9 mm right parietal scalp lesion (series 2, image 17). IMPRESSION: No evidence of acute intracranial abnormality. Mild ill-defined hypoattenuation within the cerebral white matter is nonspecific, but most often secondary to chronic small vessel ischemia. Nonspecific 9 mm right parietal scalp lesion. Direct visualization recommended. Electronically Signed   By: Kellie Simmering DO   On: 12/16/2020 13:27    Procedures Procedures   Medications Ordered in ED  Medications - No data to display  ED Course  I have reviewed the triage vital signs and the nursing notes.  Pertinent  labs & imaging results that were available during my care of the patient were reviewed by me and considered in my medical decision making (see chart for details).    MDM Rules/Calculators/A&P                         Imaging ordered.  Reviewed nursing notes and prior charts for additional history.   CT reviewed/intepreted by me - no hem.   Pt is tolerating po fluids well/ginger ale. No recurrent nv. States is feeling improved.   Pt currently appears stable for d/c.      Final Clinical Impression(s) / ED Diagnoses Final diagnoses:  None    Rx / DC Orders ED Discharge Orders    None       Lajean Saver, MD 12/16/20 1350

## 2020-12-16 NOTE — ED Triage Notes (Addendum)
Patient hit in head with corner or car door on Sunday, swelling noted to right side of forehead.  Patient endorses pain, vomiting (last time this morning).

## 2020-12-25 ENCOUNTER — Other Ambulatory Visit: Payer: Self-pay | Admitting: Registered Nurse

## 2020-12-25 ENCOUNTER — Other Ambulatory Visit: Payer: Self-pay

## 2020-12-25 DIAGNOSIS — M797 Fibromyalgia: Secondary | ICD-10-CM

## 2020-12-29 ENCOUNTER — Other Ambulatory Visit: Payer: Self-pay

## 2020-12-29 ENCOUNTER — Encounter: Payer: Self-pay | Admitting: Physical Medicine & Rehabilitation

## 2020-12-29 ENCOUNTER — Encounter (HOSPITAL_BASED_OUTPATIENT_CLINIC_OR_DEPARTMENT_OTHER): Payer: Medicare Other | Admitting: Physical Medicine & Rehabilitation

## 2020-12-29 VITALS — BP 156/78 | HR 112 | Temp 98.8°F | Ht 64.0 in | Wt 149.0 lb

## 2020-12-29 DIAGNOSIS — M797 Fibromyalgia: Secondary | ICD-10-CM

## 2020-12-29 DIAGNOSIS — M7918 Myalgia, other site: Secondary | ICD-10-CM

## 2020-12-29 DIAGNOSIS — G894 Chronic pain syndrome: Secondary | ICD-10-CM

## 2020-12-29 DIAGNOSIS — Z5181 Encounter for therapeutic drug level monitoring: Secondary | ICD-10-CM

## 2020-12-29 DIAGNOSIS — M542 Cervicalgia: Secondary | ICD-10-CM | POA: Diagnosis not present

## 2020-12-29 DIAGNOSIS — Z79891 Long term (current) use of opiate analgesic: Secondary | ICD-10-CM

## 2020-12-29 MED ORDER — HYDROCODONE-ACETAMINOPHEN 5-325 MG PO TABS: 1.0000 | ORAL_TABLET | Freq: Three times a day (TID) | ORAL | 0 refills | Status: DC | PRN

## 2020-12-29 MED ORDER — FENTANYL 12 MCG/HR TD PT72: 1.0000 | MEDICATED_PATCH | TRANSDERMAL | 0 refills | Status: DC

## 2020-12-29 NOTE — Progress Notes (Signed)
Procedure Visit Procedure: Trigger point injections Dx: Myofascial pain    After informed consent and preparation of the skin with isopropyl alcohol, I injected the BILATERAL mid trapezius muscles and left L2 paraspinals each with 2cc of 1% lidocaine. The patient tolerated well, and no complications were experienced. Post-injection instructions were provided.    We decided to wean fentanyl to off. Taper instructions were provided.   F/u with NP in 2 mos

## 2020-12-29 NOTE — Patient Instructions (Addendum)
PLEASE FEEL FREE TO CALL OUR OFFICE WITH ANY PROBLEMS OR QUESTIONS ([phone])   FENTANYL PATCH "TAPER":  ONE EVERY 4 DAYS FOR 4 PATCHES   THEN ONE EVERY 5 DAYS FOR 4 PATCHES   THEN STOP

## 2021-01-04 LAB — TOXASSURE SELECT,+ANTIDEPR,UR

## 2021-01-12 ENCOUNTER — Telehealth: Payer: Self-pay | Admitting: *Deleted

## 2021-01-12 NOTE — Telephone Encounter (Signed)
Urine drug screen for this encounter is consistent for prescribed medication 

## 2021-01-27 ENCOUNTER — Other Ambulatory Visit: Payer: Self-pay

## 2021-01-27 DIAGNOSIS — G894 Chronic pain syndrome: Secondary | ICD-10-CM

## 2021-01-27 DIAGNOSIS — M797 Fibromyalgia: Secondary | ICD-10-CM

## 2021-01-28 NOTE — Telephone Encounter (Signed)
PMP was Reviewed. Last Fentanyl was filled on 01/03/2021. Refill to early. Placed a call to Ms. Mancillas she also states she has hydrocodone as well.

## 2021-01-31 ENCOUNTER — Other Ambulatory Visit: Payer: Self-pay | Admitting: Registered Nurse

## 2021-01-31 ENCOUNTER — Encounter: Payer: Medicare Other | Admitting: Registered Nurse

## 2021-01-31 DIAGNOSIS — G894 Chronic pain syndrome: Secondary | ICD-10-CM

## 2021-01-31 DIAGNOSIS — M797 Fibromyalgia: Secondary | ICD-10-CM

## 2021-01-31 MED ORDER — CYCLOBENZAPRINE HCL 10 MG PO TABS: 1.0000 | ORAL_TABLET | Freq: Three times a day (TID) | ORAL | 4 refills | Status: DC

## 2021-01-31 MED ORDER — HYDROCODONE-ACETAMINOPHEN 5-325 MG PO TABS: 1.0000 | ORAL_TABLET | Freq: Three times a day (TID) | ORAL | 0 refills | Status: DC | PRN

## 2021-01-31 NOTE — Telephone Encounter (Signed)
Please call in medications. Patient unable to come today due to passing kidney stones.  She is also having trouble with her wrist  (De Quervain's tenosynovitis, right)  It is swollen and she cannot use the hand. She is asking for a medrol dosepk.

## 2021-01-31 NOTE — Telephone Encounter (Signed)
Placed a call to Patty Salas regarding medrol dose pak. Her last Medrol Dose Pak was March 2022. Fentanyl was weaned off per DrSwartz note. Placed a call to Patty Salas regarding the above, she verbalizes understanding.

## 2021-02-03 ENCOUNTER — Ambulatory Visit: Payer: Medicare Other | Admitting: Registered Nurse

## 2021-02-16 ENCOUNTER — Telehealth: Payer: Self-pay | Admitting: Physician Assistant

## 2021-02-16 ENCOUNTER — Other Ambulatory Visit: Payer: Self-pay | Admitting: Physician Assistant

## 2021-02-16 DIAGNOSIS — C8206 Follicular lymphoma grade I, intrapelvic lymph nodes: Secondary | ICD-10-CM

## 2021-02-16 NOTE — Telephone Encounter (Signed)
Left voicemail for patient

## 2021-02-16 NOTE — Telephone Encounter (Signed)
Patient would like a referral for the cancer center for lymphoma and her upper stomach is bothering patient. It is swollen. Patient is going to the bathroom normally she states, she has IBS. Patient would like to come off the patch the left patch was put on 02-02-21 and was weaned off of it. Last patch was taken off the 02-07-21. Patient is sick on her stomach a lot and throwing up, it is her upper stomach. Please advise, thanks.   Patient would also like to come off the lorazepam eventually as well.

## 2021-02-16 NOTE — Telephone Encounter (Signed)
We do not prescribe a patch for the patient. I also do not see this on her med list.  The Lorazepam is also not prescribed by our office. She would need to discuss concerns with those medications with the prescribing providers.   I have placed a referral to [facility] for Lymphoma.   Please contact patient and advise of the above and schedule apt for abdominal issues and concerns or patient can reach out to [facility] is aware of the above and is agreeable with recommendations. AS, CMA

## 2021-02-17 ENCOUNTER — Telehealth: Payer: Self-pay | Admitting: Hematology and Oncology

## 2021-02-17 NOTE — Telephone Encounter (Signed)
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 Patty Salas has been cld and scheduled to see Dr. Alvy Bimler on 6/6 at 1pm. Pt aware to arrive 30 minutes early. I informed her that she needs to ensure that she makes this appt this time. She's aware to arrive 30 minutes early.

## 2021-02-23 ENCOUNTER — Encounter: Payer: Medicare Other | Admitting: Registered Nurse

## 2021-03-01 ENCOUNTER — Telehealth: Payer: Self-pay | Admitting: Physician Assistant

## 2021-03-01 ENCOUNTER — Encounter: Payer: Medicare Other | Attending: Physical Medicine & Rehabilitation | Admitting: Registered Nurse

## 2021-03-01 ENCOUNTER — Other Ambulatory Visit: Payer: Self-pay

## 2021-03-01 ENCOUNTER — Encounter: Payer: Self-pay | Admitting: Registered Nurse

## 2021-03-01 VITALS — BP 131/76 | HR 89 | Temp 100.8°F | Ht 64.0 in | Wt 148.0 lb

## 2021-03-01 DIAGNOSIS — M5416 Radiculopathy, lumbar region: Secondary | ICD-10-CM | POA: Diagnosis present

## 2021-03-01 DIAGNOSIS — G894 Chronic pain syndrome: Secondary | ICD-10-CM

## 2021-03-01 DIAGNOSIS — Z5181 Encounter for therapeutic drug level monitoring: Secondary | ICD-10-CM | POA: Diagnosis present

## 2021-03-01 DIAGNOSIS — Z79891 Long term (current) use of opiate analgesic: Secondary | ICD-10-CM | POA: Diagnosis present

## 2021-03-01 DIAGNOSIS — M546 Pain in thoracic spine: Secondary | ICD-10-CM | POA: Diagnosis present

## 2021-03-01 DIAGNOSIS — G8929 Other chronic pain: Secondary | ICD-10-CM | POA: Diagnosis present

## 2021-03-01 DIAGNOSIS — M4716 Other spondylosis with myelopathy, lumbar region: Secondary | ICD-10-CM | POA: Diagnosis present

## 2021-03-01 DIAGNOSIS — M797 Fibromyalgia: Secondary | ICD-10-CM | POA: Diagnosis present

## 2021-03-01 DIAGNOSIS — M47812 Spondylosis without myelopathy or radiculopathy, cervical region: Secondary | ICD-10-CM | POA: Diagnosis present

## 2021-03-01 DIAGNOSIS — M542 Cervicalgia: Secondary | ICD-10-CM | POA: Diagnosis present

## 2021-03-01 DIAGNOSIS — M5412 Radiculopathy, cervical region: Secondary | ICD-10-CM | POA: Diagnosis present

## 2021-03-01 MED ORDER — HYDROCODONE-ACETAMINOPHEN 5-325 MG PO TABS: 1.0000 | ORAL_TABLET | Freq: Three times a day (TID) | ORAL | 0 refills | Status: DC | PRN

## 2021-03-01 NOTE — Telephone Encounter (Signed)
Patient has not yet established care with Oncology.   Per Herb Grays advising patient to go to Urgent Care for evaluation and treatment. Pt is aware and verbalized understanding. AS, CMA

## 2021-03-01 NOTE — Progress Notes (Signed)
Subjective:    Patient ID: Patty Salas, female    DOB: [birth year], 65 y.o.   MRN: [id number]  HPI: Patty Salas is a 65 y.o. female whose appointment was changed to a My-Chart Video visit, she called office this morning reporting she has a fever. Patty Salas agrees with My-Chart Video visit and verbalizes understanding. She states her pain is located in her neck, mid- lower back radiating into her bilateral hips, bilateral lower extremities and bilateral feet with tingling and burning. She rates her pain 7. Her current exercise regime is walking and performing stretching exercises.  Patty Salas is 16.21 MME.  Last UDS was Performed on 12/29/2020, it was consistent.    Pain Inventory Average Pain 8 Pain Right Now 7 My pain is constant, sharp, burning, dull, stabbing, tingling and aching  In the last 24 hours, has pain interfered with the following? General activity 7 Relation with others 0 Enjoyment of life 7 What TIME of day is your pain at its worst? morning , daytime, evening and night Sleep (in general) Fair  Pain is worse with: bending, inactivity, standing and some activites Pain improves with: rest, heat/ice, medication, injections and exercise, music Relief from Meds: 7  Family History  Problem Relation Age of Onset  . Cancer Mother        stomach  . Migraines Mother   . Arthritis Mother   . Emphysema Mother   . Heart disease Father   . Hypertension Father   . Cancer Father        colon  . Dementia Father   . Hypertension Brother   . Migraines Brother   . Hypertension Brother   . Migraines Brother   . Alcohol abuse Brother   . Bipolar disorder Brother   . Migraines Daughter   . Arthritis Maternal Grandmother   . Cancer Maternal Grandmother        stomach  . Diabetes Maternal Grandmother   . Stroke Maternal Grandmother   . Cancer Maternal Aunt        lung  . Emphysema Maternal Aunt   . Cancer Paternal Uncle        colon  .  Hypertension Maternal Aunt   . Heart disease Maternal Aunt   . Cancer Paternal Uncle        lung   Social History   Socioeconomic History  . Marital status: Married    Spouse name: Not on file  . Number of children: 1  . Years of education: COLLEGE1  . Highest education level: Not on file  Occupational History  . Occupation: HOUSEWIFE    Employer: UNEMPLOYED  Tobacco Use  . Smoking status: Never Smoker  . Smokeless tobacco: Never Used  Vaping Use  . Vaping Use: Never used  Substance and Sexual Activity  . Alcohol use: No  . Drug use: No  . Sexual activity: Not on file  Other Topics Concern  . Not on file  Social History Narrative   Patient is right handed.   Patient drinks caffeine occasionally.   Social Determinants of Health   Financial Resource Strain: Not on file  Food Insecurity: Not on file  Transportation Needs: Not on file  Physical Activity: Not on file  Stress: Not on file  Social Connections: Not on file   Past Surgical History:  Procedure Laterality Date  .  kidney stones    . ABDOMINAL ADHESION SURGERY    . [place]  partial- hemmoraged after surgery-stitch came loose  . APPENDECTOMY  1993   hemmoraged after surgery- stitch came loose  . CERVICAL DISC SURGERY  06/04/2001   with fusion  . CHOLECYSTECTOMY  1985  . colonoscopy    . COLONOSCOPY    . CYSTOSCOPY    . FOOT SURGERY     lt  . jj stent  07/2002   with ureteroscopy, cystoscopy and then removal of stent in office  . LITHOTRIPSY    . LYMPH NODE BIOPSY Right 03/06/2014   Procedure: right groin LYMPH NODE BIOPSY;  Surgeon: Merrie Roof, MD;  Location: [facility];  Service: General;  Laterality: Right;  . LYMPHADENECTOMY N/A 12/29/2015   Procedure: RETROPERITONEAL LYMPHADENECTOMY;  Surgeon: Alexis Frock, MD;  Location: [facility];  Service: Urology;  Laterality: N/A;  . NECK SURGERY  2002  . ROBOT ASSISTED LAPAROSCOPIC NEPHRECTOMY Left 12/29/2015    Procedure: XI ROBOTIC ASSISTED LEFT LAPAROSCOPIC NEPHRECTOMY;  Surgeon: Alexis Frock, MD;  Location: [facility];  Service: Urology;  Laterality: Left;  . TONSILLECTOMY  1976   Past Surgical History:  Procedure Laterality Date  .  kidney stones    . ABDOMINAL ADHESION SURGERY    . ABDOMINAL HYSTERECTOMY  1995   partial- hemmoraged after surgery-stitch came loose  . APPENDECTOMY  1993   hemmoraged after surgery- stitch came loose  . CERVICAL DISC SURGERY  06/04/2001   with fusion  . CHOLECYSTECTOMY  1985  . colonoscopy    . COLONOSCOPY    . CYSTOSCOPY    . FOOT SURGERY     lt  . jj stent  07/2002   with ureteroscopy, cystoscopy and then removal of stent in office  . LITHOTRIPSY    . LYMPH NODE BIOPSY Right 03/06/2014   Procedure: right groin LYMPH NODE BIOPSY;  Surgeon: Merrie Roof, MD;  Location: [facility];  Service: General;  Laterality: Right;  . LYMPHADENECTOMY N/A 12/29/2015   Procedure: RETROPERITONEAL LYMPHADENECTOMY;  Surgeon: Alexis Frock, MD;  Location: [facility];  Service: Urology;  Laterality: N/A;  . NECK SURGERY  2002  . ROBOT ASSISTED LAPAROSCOPIC NEPHRECTOMY Left 12/29/2015   Procedure: XI ROBOTIC ASSISTED LEFT LAPAROSCOPIC NEPHRECTOMY;  Surgeon: Alexis Frock, MD;  Location: [facility];  Service: Urology;  Laterality: Left;  . TONSILLECTOMY  1976   Past Medical History:  Diagnosis Date  . Anxiety   . Arthritis   . Asthma   . Bipolar 2 disorder ([place])    pt stated, "I have Bipolar 2 and it is remission"  . Bipolar affective ([place])   . Calcifying tendinitis of shoulder   . Carpal tunnel syndrome   . Cervical facet syndrome   . Chronic pain syndrome   . Complication of anesthesia   . Depression   . Diverticulosis   . Falls   . Fibromyalgia   . 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 Full dentures   . Gout   . Headache(784.0)    migraines  . Herpesviral infection   . Hypertension   . IBS (irritable bowel  syndrome)    with diarrhea  . Lymphadenopathy   . Myalgia and myositis, unspecified   . PAT (paroxysmal atrial tachycardia) ([facility])    asymptomatic 10 beat run on event monitor 09/2020  . PONV (postoperative nausea and vomiting)   . PTSD (post-traumatic stress disorder)   . Renal calculi   . Restless legs syndrome (RLS)   . Thoracic  radiculopathy   . Vitamin D deficiency   . Wears glasses    BP 131/76 Comment: PER Pt today's visit is a video visit  Pulse 89   Temp (!) 100.8 F (38.2 C)   Ht 5\' 4"  (1.626 m)   Wt 148 lb (67.1 kg)   BMI 25.40 kg/m   Opioid Risk Score:   Fall Risk Score:  `1  Depression screen PHQ 2/9  Depression screen [facility] 2/9 10/05/2020 04/09/2020 02/04/2020 10/06/2019 04/30/2019 03/06/2019 01/07/2019  Decreased Interest 1 1 1 1 1 1 1   Down, Depressed, Hopeless - 1 1 1 1 1 1   PHQ - 2 Score 1 2 2 2 2 2 2   Altered sleeping - - - - - - -  Tired, decreased energy - - - - - - -  Change in appetite - - - - - - -  Feeling bad or failure about yourself  - - - - - - -  Trouble concentrating - - - - - - -  Moving slowly or fidgety/restless - - - - - - -  Suicidal thoughts - - - - - - -  PHQ-9 Score - - - - - - -  Difficult doing work/chores - - - - - - -  Some recent data might be hidden   Review of Systems  Musculoskeletal: Positive for arthralgias, back pain and gait problem.  All other systems reviewed and are negative.      Objective:   Physical Exam Vitals and nursing note reviewed.  Musculoskeletal:     Comments: No Physical Exam Performed: My-Chart Video Visit           Assessment & Plan:  1.History of fibromyalgia with myofascial pain and multiple trigger points.Continue with Heat and exercise Regime. Continue withcurrent medication regimen withgabapentin.03/01/2021 2. Chronic migraine headaches.Continue to Monitor. S/P Botox.on 05/30/2017.03/01/2021 3. Midline Low Back Pain/ Lumbar Spondylosis/Lumbar degenerative disk disease, L4-5/ Lumbar  Radiculitis: Continue with HEP and current medication regimen. Refilled: Fentanyl Patch27mcg one patch every three days #10 and ContinueHydrocodone 5/325 mg one tablet every8hours as needed #80.Marland KitchenContinue with slow weaning of Hydrocodone.03/01/2021 4. 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 Urology Following.03/01/2021. 5. Right CTS: Continue to wear Wrist stabilizer.03/01/2021 6. Cervicalgia/ Cervical RadiculitisCervical Spondylosis: Continuecurrent medication regiment withGabapentin. Continue with HEP and Continue to monitor.03/01/2021 7. Muscle Spasm: Continuecurrent medication regimen withFlexeril as needed.03/01/2021 9. Bilateral Greater Trochanter Bursitis: Continue to alternate with ice and heat therapy. Continue HEP as Tolerated. Continue to monitor.03/01/2021 10. Bilateral Knee Pain:No complaints today.S/P Knee Injection by Dr Sharol Given on 02/23/2020. Orthopedics Following.Continue to Monitor.03/01/2021  F/U in 1 month  My-Chart Video Visit Established Patient Location of Patient: In her Home  Location of Provider: In the Office    F/U in 1 month

## 2021-03-01 NOTE — Telephone Encounter (Signed)
Patient called office stating she has a fever of 101.3 with no other symptoms. Pt states she has taken 2 covid tests that are negative.   Patient is concerned that the fever is being caused due to her cancer. I advised patient to contact oncologist for advise on treatment. Pt verbalized understanding. AS, CMA

## 2021-03-03 ENCOUNTER — Encounter: Payer: Self-pay | Admitting: Registered Nurse

## 2021-03-03 ENCOUNTER — Other Ambulatory Visit: Payer: Self-pay | Admitting: Neurology

## 2021-03-07 ENCOUNTER — Inpatient Hospital Stay (HOSPITAL_BASED_OUTPATIENT_CLINIC_OR_DEPARTMENT_OTHER): Payer: Medicare Other | Admitting: Hematology and Oncology

## 2021-03-07 ENCOUNTER — Telehealth: Payer: Self-pay

## 2021-03-07 ENCOUNTER — Encounter: Payer: Self-pay | Admitting: Hematology and Oncology

## 2021-03-07 ENCOUNTER — Inpatient Hospital Stay: Payer: Medicare Other | Attending: Hematology and Oncology

## 2021-03-07 ENCOUNTER — Other Ambulatory Visit: Payer: Self-pay

## 2021-03-07 VITALS — BP 143/64 | HR 105 | Temp 98.7°F | Resp 18 | Ht 64.0 in | Wt 152.2 lb

## 2021-03-07 DIAGNOSIS — Z85528 Personal history of other malignant neoplasm of kidney: Secondary | ICD-10-CM | POA: Diagnosis not present

## 2021-03-07 DIAGNOSIS — Z800 Family history of malignant neoplasm of digestive organs: Secondary | ICD-10-CM | POA: Diagnosis not present

## 2021-03-07 DIAGNOSIS — R109 Unspecified abdominal pain: Secondary | ICD-10-CM | POA: Insufficient documentation

## 2021-03-07 DIAGNOSIS — C8206 Follicular lymphoma grade I, intrapelvic lymph nodes: Secondary | ICD-10-CM

## 2021-03-07 DIAGNOSIS — Z801 Family history of malignant neoplasm of trachea, bronchus and lung: Secondary | ICD-10-CM | POA: Insufficient documentation

## 2021-03-07 DIAGNOSIS — M5136 Other intervertebral disc degeneration, lumbar region: Secondary | ICD-10-CM

## 2021-03-07 DIAGNOSIS — E559 Vitamin D deficiency, unspecified: Secondary | ICD-10-CM

## 2021-03-07 DIAGNOSIS — R1013 Epigastric pain: Secondary | ICD-10-CM

## 2021-03-07 LAB — CBC WITH DIFFERENTIAL/PLATELET
Abs Immature Granulocytes: 0.04 10*3/uL (ref 0.00–0.07)
Basophils Absolute: 0.1 10*3/uL (ref 0.0–0.1)
Basophils Relative: 1 %
Eosinophils Absolute: 0.2 10*3/uL (ref 0.0–0.5)
Eosinophils Relative: 3 %
HCT: 38.4 % (ref 36.0–46.0)
Hemoglobin: 12.3 g/dL (ref 12.0–15.0)
Immature Granulocytes: 1 %
Lymphocytes Relative: 16 %
Lymphs Abs: 0.9 10*3/uL (ref 0.7–4.0)
MCH: 30.3 pg (ref 26.0–34.0)
MCHC: 32 g/dL (ref 30.0–36.0)
MCV: 94.6 fL (ref 80.0–100.0)
Monocytes Absolute: 0.6 10*3/uL (ref 0.1–1.0)
Monocytes Relative: 10 %
Neutro Abs: 4.1 10*3/uL (ref 1.7–7.7)
Neutrophils Relative %: 69 %
Platelets: 258 10*3/uL (ref 150–400)
RBC: 4.06 MIL/uL (ref 3.87–5.11)
RDW: 13 % (ref 11.5–15.5)
WBC: 5.8 10*3/uL (ref 4.0–10.5)
nRBC: 0 % (ref 0.0–0.2)

## 2021-03-07 LAB — COMPREHENSIVE METABOLIC PANEL
ALT: 30 U/L (ref 0–44)
AST: 36 U/L (ref 15–41)
Albumin: 4.2 g/dL (ref 3.5–5.0)
Alkaline Phosphatase: 87 U/L (ref 38–126)
Anion gap: 12 (ref 5–15)
BUN: 18 mg/dL (ref 8–23)
CO2: 30 mmol/L (ref 22–32)
Calcium: 10.2 mg/dL (ref 8.9–10.3)
Chloride: 100 mmol/L (ref 98–111)
Creatinine, Ser: 0.82 mg/dL (ref 0.44–1.00)
GFR, Estimated: >60 mL/min (ref >60)
Glucose, Bld: 96 mg/dL (ref 70–99)
Potassium: 3.9 mmol/L (ref 3.5–5.1)
Sodium: 142 mmol/L (ref 135–145)
Total Bilirubin: 0.7 mg/dL (ref 0.3–1.2)
Total Protein: 8.7 g/dL — ABNORMAL HIGH (ref 6.5–8.1)

## 2021-03-07 LAB — HEPATITIS B SURFACE ANTIGEN: Hepatitis B Surface Ag: NONREACTIVE

## 2021-03-07 LAB — HEPATITIS B SURFACE ANTIBODY,QUALITATIVE: Hep B S Ab: NONREACTIVE

## 2021-03-07 LAB — URIC ACID: Uric Acid, Serum: 6.3 mg/dL (ref 2.5–7.1)

## 2021-03-07 LAB — VITAMIN D 25 HYDROXY (VIT D DEFICIENCY, FRACTURES): Vit D, 25-Hydroxy: 19.91 ng/mL — ABNORMAL LOW (ref 30–100)

## 2021-03-07 LAB — LACTATE DEHYDROGENASE: LDH: 210 U/L — ABNORMAL HIGH (ref 98–192)

## 2021-03-07 NOTE — Assessment & Plan Note (Signed)
She have history of kidney cancer that does not require adjuvant treatment It is not clear to me whether she had further imaging study over the last 5 years through urology office I will get copies of office note to be sent to me for further review

## 2021-03-07 NOTE — Assessment & Plan Note (Signed)
The patient was lost to follow-up since 2017 She has attempted to keep her appointment over the last 5 years but was unable to keep most of her appointment and they were rescheduled many times She felt enlarged lymph node in the left supraclavicular and right axilla  I am not able to appreciate left supraclavicular lymphadenopathy There is fullness in the right axillary region that is soft and rubbery I recommend PET CT scan for staging as she is symptomatic and may need treatment soon I will order basic blood work and PET scan to be done next week and plan to see her back after results of the PET CT scan is available to determine the next step

## 2021-03-07 NOTE — Assessment & Plan Note (Signed)
She is known to have chronic musculoskeletal pain I will order vitamin D level

## 2021-03-07 NOTE — Assessment & Plan Note (Signed)
Examination revealed presence of abdominal wall hernia at the site that she complained of pain There is fullness in the scar and definitive signs of hernia that looks worse when she sits up I will request results of her latest colonoscopy from her GI doctor As above, I plan to order PET CT scan for staging She is currently attempting to taper off pain medicine as directed by the pain management specialist

## 2021-03-07 NOTE — Progress Notes (Signed)
[facility] CONSULT NOTE  Patient Care Team: Lorrene Reid, PA-C as PCP - General Richmond Campbell, MD as Consulting Physician (Gastroenterology) Festus Aloe, MD as Consulting Physician (Urology) Alexis Frock, MD as Consulting Physician (Urology) Bayard Hugger, NP as Nurse Practitioner (Physical Medicine and Rehabilitation) Meredith Staggers, MD as Consulting Physician (Physical Medicine and Rehabilitation) Kathrynn Ducking, MD as Consulting Physician (Neurology) Wallene Huh, DPM as Consulting Physician (Podiatry) Ricard Dillon, MD (Psychiatry) Prueter, Gustavo Lah (Physical Medicine and Rehabilitation) Sueanne Margarita, MD as Consulting Physician (Cardiology) Newt Minion, MD as Consulting Physician (Orthopedic Surgery) Calton Dach, MD as Referring Physician (Optometry) Heath Lark, MD as Consulting Physician (Hematology and Oncology)  ASSESSMENT & PLAN:  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 [facility]) The patient was lost to follow-up since 2017 She has attempted to keep her appointment over the last 5 years but was unable to keep most of her appointment and they were rescheduled many times She felt enlarged lymph node in the left supraclavicular and right axilla  I am not able to appreciate left supraclavicular lymphadenopathy There is fullness in the right axillary region that is soft and rubbery I recommend PET CT scan for staging as she is symptomatic and may need treatment soon I will order basic blood work and PET scan to be done next week and plan to see her back after results of the PET CT scan is available to determine the next step  History of kidney cancer She have history of kidney cancer that does not require adjuvant treatment It is not clear to me whether she had further imaging study over the last 5 years through urology office I will get copies of office note to be sent to me for further review  Lumbar  degenerative disc disease She is known to have chronic musculoskeletal pain I will order vitamin D level  Abdominal pain Examination revealed presence of abdominal wall hernia at the site that she complained of pain There is fullness in the scar and definitive signs of hernia that looks worse when she sits up I will request results of her latest colonoscopy from her GI doctor As above, I plan to order PET CT scan for staging She is currently attempting to taper off pain medicine as directed by the pain management specialist   Orders Placed This Encounter  Procedures  . NM PET Image Restage (PS) Skull Base to Thigh    Standing Status:   Future    Standing Expiration Date:   03/07/2022    Order Specific Question:   If indicated for the ordered procedure, I authorize the administration of a radiopharmaceutical per Radiology protocol    Answer:   Yes    Order Specific Question:   Preferred imaging location?    Answer:   [facility]    Order Specific Question:   Radiology Contrast Protocol - do NOT remove file path    Answer:   [email or web address]  . CBC with Differential/Platelet    Standing Status:   Future    Number of Occurrences:   1    Standing Expiration Date:   03/07/2022  . Comprehensive metabolic panel    Standing Status:   Future    Number of Occurrences:   1    Standing Expiration Date:   03/07/2022  . Lactate dehydrogenase    Standing Status:   Future    Standing Expiration Date:   03/07/2022  . Hepatitis  B surface antigen    Standing Status:   Future    Number of Occurrences:   1    Standing Expiration Date:   03/07/2022  . Hepatitis B surface antibody,qualitative    Standing Status:   Future    Number of Occurrences:   1    Standing Expiration Date:   03/07/2022  . Uric acid    Standing Status:   Future    Number of Occurrences:   1    Standing Expiration Date:   03/07/2022  . VITAMIN D 25 Hydroxy (Vit-D Deficiency, Fractures)     Standing Status:   Future    Number of Occurrences:   1    Standing Expiration Date:   03/07/2022  . Hepatitis B core antibody, IgM    Standing Status:   Future    Number of Occurrences:   1    Standing Expiration Date:   03/07/2022  . Lactate dehydrogenase    Standing Status:   Future    Number of Occurrences:   1    Standing Expiration Date:   03/07/2022    The total time spent in the appointment was 60 minutes encounter with patients including review of chart and various tests results, discussions about plan of care and coordination of care plan   All questions were answered. The patient knows to call the clinic with any problems, questions or concerns. No barriers to learning was detected.  Heath Lark, MD 6/6/20221:37 PM  CHIEF COMPLAINTS/PURPOSE OF CONSULTATION:  Lymphoma, for further follow-up  HISTORY OF PRESENTING ILLNESS:  Patty Salas 65 y.o. female is here because of history of follicular lymphoma and kidney cancer The patient is billed as a new patient due to loss of follow-up since 2017 Over the last 5 years, she has canceled and rescheduled her appointment many times The patient apologized for not keeping her appointment as scheduled in the past few years She had many appointments to see several different specialists for multiple different reasons Her husband is not doing well and is battling cancer  In regards to her lymphoma diagnosis, she has not had any staging CT scan or PET scan for the last few years She thought she felt lymphadenopathy on the left supraclavicular region and right axilla that seems to be getting worse and bothersome From the kidney cancer standpoint, she stated she has close monitoring and follow-up with the urologist She underwent cystoscopy last year for difficulties with urination that has since resolved She denies recent hematuria  The most bothersome symptom right now is intermittent mid epigastric pain that radiate bilaterally She  complained of the pain as being a sharp pain; it is not affected by food or activity She has chronic irritable bowel syndrome and her bowel habits could change from diarrhea to constipation but she does not think is related to that The pain has gotten worse for the past 6 months to a year She follows closely with Dr. Earlean Shawl and had colonoscopy last year that was unremarkable She noted abdominal swelling/protrusion in the mid epigastric region She is attempting to taper herself off pain medicine; her last dose of fentanyl patch was in May She denies significant weight changes She has seen cardiologist and underwent extensive evaluation including echocardiogram and was told that her epigastric pain is not due to cardiac chest pain  I have reviewed her chart and materials related to her cancer extensively and collaborated history with the patient. 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 Marlou Starks took out a lymph node in the right groin   01/01/2014 Imaging   Ct showed lymphadenopathy   01/26/2014 PET scan   Hypermetabolic retroperitoneal, periportal, and mesenteric lymph nodes is concerning for lymphoproliferative process.   03/06/2014 Pathology Results   Accession: [id number] right groin LN biopsy came back benign   06/02/2014 PET scan   Similar appearance of hypermetabolic retroperitoneal, periportal and mesenteric lymph nodes. Low grade lymphoproliferative disorder cannot be excluded.   12/01/2014 Imaging   Mildly enlarged upper abdominal/ retroperitoneal lymph nodes, measuring up to 13 mm, stable versus minimally increased. No evidence of splenomegaly.   12/29/2015 Pathology Results   Accession: [id number] LN biopsy of periaortic region was positive for low grade folliculr lymphoma. Leftf kidney specimen showed renal cell carcinoma   12/29/2015 Surgery   She underwent robotic-assisted  laparoscopic left radical nephrectomy. Left retroperitoneal lymph node dissection.   03/06/2016 PET scan   PET Ct showed stable lymphadenopathy with no major change   01/29/2018 Procedure   Colonoscopy by Dr. Earlean Shawl revealed 1 polyp and internal hemorrhoids   03/07/2021 Cancer Staging   Staging form: Hodgkin and Non-Hodgkin Lymphoma, AJCC 7th Edition - Clinical: A - Asymptomatic - Signed by Heath Lark, MD on 03/07/2021 Stage prefix: Recurrence   03/07/2021 Cancer Staging   Staging form: Hodgkin and Non-Hodgkin Lymphoma, AJCC 7th Edition - Pathologic stage from 03/07/2021: Stage III - Signed by Heath Lark, MD on 03/07/2021     MEDICAL HISTORY:  Past Medical History:  Diagnosis Date  . Anxiety   . Arthritis   . Asthma   . Bipolar 2 disorder ([place])    pt stated, "I have Bipolar 2 and it is remission"  . Bipolar affective ([place])   . Calcifying tendinitis of shoulder   . Carpal tunnel syndrome   . Cervical facet syndrome   . Chronic pain syndrome   . Complication of anesthesia   . Depression   . Diverticulosis   . Falls   . Fibromyalgia   . 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 Full dentures   . Gout   . Headache(784.0)    migraines  . Herpesviral infection   . Hypertension   . IBS (irritable bowel syndrome)    with diarrhea  . Lymphadenopathy   . Myalgia and myositis, unspecified   . PAT (paroxysmal atrial tachycardia) ([facility])    asymptomatic 10 beat run on event monitor 09/2020  . PONV (postoperative nausea and vomiting)   . PTSD (post-traumatic stress disorder)   . Renal calculi   . Restless legs syndrome (RLS)   . Thoracic radiculopathy   . Vitamin D deficiency   . Wears glasses     SURGICAL HISTORY: Past Surgical History:  Procedure Laterality Date  .  kidney stones    . ABDOMINAL ADHESION SURGERY    . ABDOMINAL HYSTERECTOMY  1995   partial- hemmoraged after surgery-stitch came loose  . APPENDECTOMY  1993   hemmoraged after  surgery- stitch came loose  . CERVICAL DISC SURGERY  06/04/2001   with fusion  . CHOLECYSTECTOMY  1985  . colonoscopy    . COLONOSCOPY    . CYSTOSCOPY    . FOOT SURGERY     lt  . jj stent  07/2002   with ureteroscopy, cystoscopy and then removal of stent in office  . LITHOTRIPSY    .  LYMPH NODE BIOPSY Right 03/06/2014   Procedure: right groin LYMPH NODE BIOPSY;  Surgeon: Merrie Roof, MD;  Location: [facility];  Service: General;  Laterality: Right;  . LYMPHADENECTOMY N/A 12/29/2015   Procedure: RETROPERITONEAL LYMPHADENECTOMY;  Surgeon: Alexis Frock, MD;  Location: [facility];  Service: Urology;  Laterality: N/A;  . NECK SURGERY  2002  . ROBOT ASSISTED LAPAROSCOPIC NEPHRECTOMY Left 12/29/2015   Procedure: XI ROBOTIC ASSISTED LEFT LAPAROSCOPIC NEPHRECTOMY;  Surgeon: Alexis Frock, MD;  Location: [facility];  Service: Urology;  Laterality: Left;  . TONSILLECTOMY  1976    SOCIAL HISTORY: Social History   Socioeconomic History  . Marital status: Married    Spouse name: Not on file  . Number of children: 1  . Years of education: COLLEGE1  . Highest education level: Not on file  Occupational History  . Occupation: HOUSEWIFE    Employer: UNEMPLOYED  Tobacco Use  . Smoking status: Never Smoker  . Smokeless tobacco: Never Used  Vaping Use  . Vaping Use: Never used  Substance and Sexual Activity  . Alcohol use: No  . Drug use: No  . Sexual activity: Not on file  Other Topics Concern  . Not on file  Social History Narrative   Patient is right handed.   Patient drinks caffeine occasionally.   Social Determinants of Health   Financial Resource Strain: Not on file  Food Insecurity: Not on file  Transportation Needs: Not on file  Physical Activity: Not on file  Stress: Not on file  Social Connections: Not on file  Intimate Partner Violence: Not on file    FAMILY HISTORY: Family History  Problem Relation Age of Onset  . Cancer Mother        stomach  .  Migraines Mother   . Arthritis Mother   . Emphysema Mother   . Heart disease Father   . Hypertension Father   . Cancer Father        colon  . Dementia Father   . Hypertension Brother   . Migraines Brother   . Hypertension Brother   . Migraines Brother   . Alcohol abuse Brother   . Bipolar disorder Brother   . Migraines Daughter   . Arthritis Maternal Grandmother   . Cancer Maternal Grandmother        stomach  . Diabetes Maternal Grandmother   . Stroke Maternal Grandmother   . Cancer Maternal Aunt        lung  . Emphysema Maternal Aunt   . Cancer Paternal Uncle        colon  . Hypertension Maternal Aunt   . Heart disease Maternal Aunt   . Cancer Paternal Uncle        lung    ALLERGIES:  is allergic to cymbalta [duloxetine hcl], divalproex sodium, duract [bromfenac], hydrochlorothiazide, imitrex [sumatriptan succinate], latex, mellaril, olanzapine, penicillins, topamax, aripiprazole, aspirin, dalmane [flurazepam hcl], darifenacin hydrobromide er, metoclopramide hcl, seroquel [quetiapine fumerate], statins, stelazine, thorazine [chlorpromazine hcl], abilify [aripiprazole], alka-seltzer [aspirin effervescent], allopurinol, biofreeze [menthol (topical analgesic)], capzasin [capsaicin], clindamycin/lincomycin, clove oil, colchicine, cough drops [benzocaine], diflucan [fluconazole], garlic, iohexol, ivp dye [iodinated diagnostic agents], lyrica [pregabalin], metamucil [psyllium], miralax [polyethylene glycol], myrbetriq [mirabegron], nsaids, other, potassium-containing compounds, prednisone, reglan [metoclopramide], renografin [diatrizoate], risperdal [risperidone], seroquel [quetiapine fumarate], simvastatin, urocit - k [potassium citrate], zyprexa [olanzapine], avelox [moxifloxacin hcl in nacl], barium-containing compounds, diclofenac, doxycycline, e-mycin [erythromycin base], fiorinal [butalbital-aspirin-caffeine], flagyl [metronidazole hcl], lamictal [lamotrigine], pregabalin,  risperidone, and toradol [ketorolac tromethamine].  MEDICATIONS:  Current Outpatient Medications  Medication Sig Dispense Refill  . acetaminophen (TYLENOL) 500 MG tablet Take 500 mg by mouth every 6 (six) hours as needed for moderate pain.    Marland Kitchen acyclovir (ZOVIRAX) 800 MG tablet Take 800 mg by mouth as needed.    Marland Kitchen albuterol (VENTOLIN HFA) 108 (90 Base) MCG/ACT inhaler Inhale 2 puffs into the lungs every 6 (six) hours as needed for wheezing. 18 g 1  . almotriptan (AXERT) 12.5 MG tablet TAKE 1 TABLET BY MOUTH AS NEEDED FOR MIGRAINE (MAX 2 TABS PER WEEK). THIS IS 90-DAY RX. PAYS CASH 24 tablet 0  . bisacodyl (DULCOLAX) 5 MG EC tablet Take 10 mg by mouth daily as needed for mild constipation.     . cyclobenzaprine (FLEXERIL) 10 MG tablet Take 1 tablet (10 mg total) by mouth 3 (three) times daily. 90 tablet 4  . docusate sodium (COLACE) 100 MG capsule Take 100 mg by mouth daily as needed for mild constipation.     Marland Kitchen EPINEPHrine 0.3 mg/0.3 mL IJ SOAJ injection Inject 0.3 mg into the muscle as needed for anaphylaxis. 2 each 0  . fexofenadine (ALLEGRA) 180 MG tablet Take 1 tablet (180 mg total) by mouth daily. 30 tablet 0  . gabapentin (NEURONTIN) 300 MG capsule Take 600 mg by mouth at bedtime.    Marland Kitchen HYDROcodone-acetaminophen (NORCO/VICODIN) 5-325 MG tablet Take 1 tablet by mouth every 8 (eight) hours as needed for moderate pain. 80 tablet 0  . hyoscyamine (LEVSIN, ANASPAZ) 0.125 MG tablet Take 0.125 mg by mouth every 4 (four) hours as needed for bladder spasms or cramping.     . lidocaine (XYLOCAINE) 2 % solution Use as directed 20 mLs in the mouth or throat daily as needed for mouth pain.     Marland Kitchen LORazepam (ATIVAN) 1 MG tablet Take 1 mg by mouth 3 (three) times daily.    . methylPREDNISolone (MEDROL) 4 MG tablet TAKE 6 TABLETS ON DAY 1 AS DIRECTED ON PACKAGE AND DECREASE BY 1 TAB EACH DAY FOR A TOTAL OF 6 DAYS    . promethazine (PHENERGAN) 25 MG tablet TAKE 0.5 TABLETS (12.5 MG TOTAL) BY MOUTH EVERY 6  (SIX) HOURS AS NEEDED FOR NAUSEA OR VOMITING. 90 tablet 0  . Trospium Chloride 60 MG CP24 Take 1 capsule by mouth every morning.      No current facility-administered medications for this visit.    REVIEW OF SYSTEMS:   Constitutional: Denies fevers, chills or abnormal night sweats Eyes: Denies blurriness of vision, double vision or watery eyes Ears, nose, mouth, throat, and face: Denies mucositis or sore throat Respiratory: Denies cough, dyspnea or wheezes Cardiovascular: Denies palpitation, chest discomfort or lower extremity swelling Skin: Denies abnormal skin rashes Lymphatics: Denies new lymphadenopathy or easy bruising Neurological:Denies numbness, tingling or new weaknesses Behavioral/Psych: Mood is stable, no new changes  All other systems were reviewed with the patient and are negative.  PHYSICAL EXAMINATION: ECOG PERFORMANCE STATUS: 1 - Symptomatic but completely ambulatory  Vitals:   03/07/21 1253  BP: (!) 143/64  Pulse: (!) 105  Resp: 18  Temp: 98.7 F (37.1 C)  SpO2: 98%   Filed Weights   03/07/21 1253  Weight: 152 lb 3.2 oz (69 kg)    GENERAL:alert, in occasional distress from epigastric pain.  She appears somewhat frail SKIN: skin color, texture, turgor are normal, no rashes or significant lesions EYES: normal, conjunctiva are pink and non-injected, sclera clear OROPHARYNX:no exudate, no erythema and lips, buccal mucosa, and tongue  normal  NECK: supple, thyroid normal size, non-tender, without nodularity LYMPH: Careful examination on neck, bilateral inguinal region and axillary region were performed.  There is no enlarged supraclavicular lymph node on the left or neck region; there is fullness on palpation on the right axilla LUNGS: clear to auscultation and percussion with normal breathing effort HEART: regular rate & rhythm and no murmurs and no lower extremity edema ABDOMEN:abdomen soft, well-healed surgical scar in the epigastric and right upper quadrant  region.  Palpable mass consistent with hernia. Musculoskeletal:no cyanosis of digits and no clubbing  PSYCH: alert & oriented x 3 with fluent speech NEURO: no focal motor/sensory deficits  LABORATORY DATA:  I have reviewed the data as listed Lab Results  Component Value Date   WBC 5.5 03/24/2020   HGB 11.9 03/24/2020   HCT 35.8 03/24/2020   MCV 92 03/24/2020   PLT 296 03/24/2020   Recent Labs    03/24/20 1000  NA 139  K 4.6  CL 99  CO2 29  GLUCOSE 93  BUN 13  CREATININE 0.70  CALCIUM 9.7  GFRNONAA 91  GFRAA 105  PROT 7.4  ALBUMIN 4.3  AST 34  ALT 29  ALKPHOS 118  BILITOT 0.4

## 2021-03-07 NOTE — Telephone Encounter (Signed)
Called and scheduled appt with Dr. Alvy Bimler on 6/24 at 1200. She is aware of appt time.

## 2021-03-08 LAB — HEPATITIS B CORE ANTIBODY, IGM: Hep B C IgM: NONREACTIVE

## 2021-03-09 ENCOUNTER — Encounter: Payer: Self-pay | Admitting: *Deleted

## 2021-03-09 ENCOUNTER — Telehealth: Payer: Self-pay | Admitting: *Deleted

## 2021-03-09 NOTE — Telephone Encounter (Signed)
Estell called and said Patty Salas asked her to call and update her on her appt with cancer center. She is calling back and says she needs Patty Salas to call her.

## 2021-03-10 NOTE — Telephone Encounter (Signed)
Return Patty Salas call.  This provider spoke to [name], Patty Salas updated this provider with her future appointments with PCP and she is awaiting a surgical referral, she reports.

## 2021-03-23 ENCOUNTER — Other Ambulatory Visit: Payer: Self-pay

## 2021-03-23 ENCOUNTER — Ambulatory Visit (HOSPITAL_COMMUNITY)
Admission: RE | Admit: 2021-03-23 | Discharge: 2021-03-23 | Disposition: A | Discharge status: Home / Self Care | Payer: Medicare Other | Source: Ambulatory Visit | Attending: Hematology and Oncology | Admitting: Hematology and Oncology

## 2021-03-23 DIAGNOSIS — Z85528 Personal history of other malignant neoplasm of kidney: Secondary | ICD-10-CM | POA: Insufficient documentation

## 2021-03-23 DIAGNOSIS — I700 Atherosclerosis of aorta: Secondary | ICD-10-CM | POA: Diagnosis not present

## 2021-03-23 DIAGNOSIS — I2510 Atherosclerotic heart disease of native coronary artery without angina pectoris: Secondary | ICD-10-CM | POA: Insufficient documentation

## 2021-03-23 DIAGNOSIS — Z905 Acquired absence of kidney: Secondary | ICD-10-CM | POA: Insufficient documentation

## 2021-03-23 DIAGNOSIS — C8206 Follicular lymphoma grade I, intrapelvic lymph nodes: Secondary | ICD-10-CM | POA: Diagnosis not present

## 2021-03-23 LAB — GLUCOSE, CAPILLARY: Glucose-Capillary: 86 mg/dL (ref 70–99)

## 2021-03-23 MED ORDER — FLUDEOXYGLUCOSE F - 18 (FDG) INJECTION
8.2000 | Freq: Once | INTRAVENOUS | Status: AC
  Administered 2021-03-23: 7.6 via INTRAVENOUS

## 2021-03-24 ENCOUNTER — Telehealth: Payer: Self-pay

## 2021-03-24 NOTE — Telephone Encounter (Signed)
She called and left a message to call her.  Called back and reviewed tomorrows appts. She does not need to be fasting. No lab appt tomorrow.

## 2021-03-25 ENCOUNTER — Other Ambulatory Visit: Payer: Self-pay

## 2021-03-25 ENCOUNTER — Telehealth: Payer: Self-pay | Admitting: Registered Nurse

## 2021-03-25 ENCOUNTER — Other Ambulatory Visit: Payer: Self-pay | Admitting: Registered Nurse

## 2021-03-25 ENCOUNTER — Telehealth: Payer: Self-pay | Admitting: Physical Medicine & Rehabilitation

## 2021-03-25 ENCOUNTER — Inpatient Hospital Stay (HOSPITAL_BASED_OUTPATIENT_CLINIC_OR_DEPARTMENT_OTHER): Payer: Medicare Other | Admitting: Hematology and Oncology

## 2021-03-25 ENCOUNTER — Encounter: Payer: Self-pay | Admitting: Hematology and Oncology

## 2021-03-25 DIAGNOSIS — R1013 Epigastric pain: Secondary | ICD-10-CM

## 2021-03-25 DIAGNOSIS — I10 Essential (primary) hypertension: Secondary | ICD-10-CM | POA: Diagnosis not present

## 2021-03-25 DIAGNOSIS — E559 Vitamin D deficiency, unspecified: Secondary | ICD-10-CM

## 2021-03-25 DIAGNOSIS — Z85528 Personal history of other malignant neoplasm of kidney: Secondary | ICD-10-CM

## 2021-03-25 DIAGNOSIS — C8206 Follicular lymphoma grade I, intrapelvic lymph nodes: Secondary | ICD-10-CM

## 2021-03-25 MED ORDER — METHYLPREDNISOLONE 4 MG PO TBPK: ORAL_TABLET | ORAL | 0 refills | Status: DC

## 2021-03-25 NOTE — Assessment & Plan Note (Signed)
Her abdominal pain is not caused by lymphoma It is likely caused by her hernia I recommend surgical evaluation Recommend she consult with her GI physician regarding her chronic nausea

## 2021-03-25 NOTE — Assessment & Plan Note (Signed)
She have no evidence of recurrent disease Observe closely She has normal renal function

## 2021-03-25 NOTE — Assessment & Plan Note (Signed)
I have reviewed multiple imaging studies with the patient and her husband She had minimal changes in lymphadenopathy There is nothing on the PET CT scan that would explain her chronic severe abdominal pain except for her hernia We discussed the current guidelines There is no need to consider any form of treatment for lymphoma I recommend surveillance in 1 year with history, physical examination and blood work only

## 2021-03-25 NOTE — Telephone Encounter (Signed)
Patient requesting a refill on medrol dose pack.

## 2021-03-25 NOTE — Assessment & Plan Note (Signed)
I explained to the patient the benefits of vitamin D replacement therapy I recommend over-the-counter vitamin D supplement at 2000 units daily

## 2021-03-25 NOTE — Telephone Encounter (Signed)
Patient calling in regarding this prescription again this afternoon.

## 2021-03-25 NOTE — Assessment & Plan Note (Signed)
Her blood pressure is elevated likely due to anxiety I reassured the patient that the results of the PET scan is favorable due to lack of progression after 5-years

## 2021-03-25 NOTE — Telephone Encounter (Signed)
Placed a call to Ms. Parkwood sent a refill for steroids. Placed a call to Ms. Cawthorn reports she is having increase intensity of right hand pain with swelling and tenderness. Medrol dose Pak prescribed. Ms. mickie badders understanding.

## 2021-03-25 NOTE — Telephone Encounter (Signed)
Medication e- scribed today.

## 2021-03-25 NOTE — Progress Notes (Signed)
[place] OFFICE PROGRESS NOTE  Patient Care Team: Lorrene Reid, PA-C as PCP - General Richmond Campbell, MD as Consulting Physician (Gastroenterology) Festus Aloe, MD as Consulting Physician (Urology) Alexis Frock, MD as Consulting Physician (Urology) Bayard Hugger, NP as Nurse Practitioner (Physical Medicine and Rehabilitation) Meredith Staggers, MD as Consulting Physician (Physical Medicine and Rehabilitation) Kathrynn Ducking, MD as Consulting Physician (Neurology) Wallene Huh, DPM as Consulting Physician (Podiatry) Ricard Dillon, MD (Psychiatry) Prueter, Gustavo Lah (Physical Medicine and Rehabilitation) Sueanne Margarita, MD as Consulting Physician (Cardiology) Newt Minion, MD as Consulting Physician (Orthopedic Surgery) Calton Dach, MD as Referring Physician (Optometry) Heath Lark, MD as Consulting Physician (Hematology and Oncology)  ASSESSMENT & PLAN:  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[place]) I have reviewed multiple imaging studies with the patient and her husband She had minimal changes in lymphadenopathy There is nothing on the PET CT scan that would explain her chronic severe abdominal pain except for her hernia We discussed the current guidelines There is no need to consider any form of treatment for lymphoma I recommend surveillance in 1 year with history, physical examination and blood work only  History of kidney cancer She have no evidence of recurrent disease Observe closely She has normal renal function  Abdominal pain Her abdominal pain is not caused by lymphoma It is likely caused by her hernia I recommend surgical evaluation Recommend she consult with her GI physician regarding her chronic nausea  Hypertension Her blood pressure is elevated likely due to anxiety I reassured the patient that the results of the PET scan is favorable due to lack of progression after 5-years  Vitamin  D deficiency I explained to the patient the benefits of vitamin D replacement therapy I recommend over-the-counter vitamin D supplement at 2000 units daily  Orders Placed This Encounter  Procedures   CBC with Differential/Platelet    Standing Status:   Standing    Number of Occurrences:   22    Standing Expiration Date:   03/25/2022   Comprehensive metabolic panel    Standing Status:   Standing    Number of Occurrences:   33    Standing Expiration Date:   03/25/2022   VITAMIN D 25 Hydroxy (Vit-D Deficiency, Fractures)    Standing Status:   Future    Standing Expiration Date:   03/25/2022    All questions were answered. The patient knows to call the clinic with any problems, questions or concerns. The total time spent in the appointment was 30 minutes encounter with patients including review of chart and various tests results, discussions about plan of care and coordination of care plan   Heath Lark, MD 03/25/2021 1:31 PM  INTERVAL HISTORY: Please see below for problem oriented charting. 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 Marlou Starks took out a lymph node in the right groin    01/01/2014 Imaging   Ct showed lymphadenopathy    01/26/2014 PET scan   Hypermetabolic retroperitoneal, periportal, and mesenteric lymph nodes is concerning for lymphoproliferative process.    03/06/2014 Pathology Results   Accession: [id number] right groin LN biopsy came back benign  06/02/2014 PET scan   Similar appearance of hypermetabolic retroperitoneal, periportal and mesenteric lymph nodes. Low grade lymphoproliferative disorder cannot be excluded.    12/01/2014  Imaging   Mildly enlarged upper abdominal/ retroperitoneal lymph nodes, measuring up to 13 mm, stable versus minimally increased.  No evidence of splenomegaly.    12/29/2015 Pathology Results   Accession: [id number] LN biopsy of periaortic region was positive for low grade folliculr lymphoma. Leftf kidney specimen showed renal cell carcinoma    12/29/2015 Surgery   She underwent robotic-assisted laparoscopic left radical nephrectomy. Left retroperitoneal lymph node dissection.    03/06/2016 PET scan   PET Ct showed stable lymphadenopathy with no major change    01/29/2018 Procedure   Colonoscopy by Dr. Earlean Shawl revealed 1 polyp and internal hemorrhoids   03/07/2021 Cancer Staging   Staging form: Hodgkin and Non-Hodgkin Lymphoma, AJCC 7th Edition - Clinical: A - Asymptomatic - Signed by Heath Lark, MD on 03/07/2021  Stage prefix: Recurrence    03/07/2021 Cancer Staging   Staging form: Hodgkin and Non-Hodgkin Lymphoma, AJCC 7th Edition - Pathologic stage from 03/07/2021: Stage III - Signed by Heath Lark, MD on 03/07/2021    03/24/2021 PET scan   1. Mild disease progression within the chest. 2. Minimal decrease in nodal size and hypermetabolism within abdominal nodes. An isolated newly hypermetabolic ileocolic mesenteric node is seen. 3.  ([place]) 4 4. Coronary artery atherosclerosis. Aortic Atherosclerosis (ICD10-I70.0). 5. Left nephrectomy.     REVIEW OF SYSTEMS:   Constitutional: Denies fevers, chills or abnormal weight loss Eyes: Denies blurriness of vision Ears, nose, mouth, throat, and face: Denies mucositis or sore throat Respiratory: Denies cough, dyspnea or wheezes Cardiovascular: Denies palpitation, chest discomfort or lower extremity swelling Skin: Denies abnormal skin rashes Lymphatics: Denies new lymphadenopathy or easy bruising Neurological:Denies numbness, tingling or new weaknesses Behavioral/Psych: Mood is stable, no new changes  All other systems were reviewed  with the patient and are negative.  I have reviewed the past medical history, past surgical history, social history and family history with the patient and they are unchanged from previous note.  ALLERGIES:  is allergic to cymbalta [duloxetine hcl], divalproex sodium, duract [bromfenac], hydrochlorothiazide, imitrex [sumatriptan succinate], latex, mellaril, olanzapine, penicillins, topamax, aripiprazole, aspirin, dalmane [flurazepam hcl], darifenacin hydrobromide er, metoclopramide hcl, seroquel [quetiapine fumerate], statins, stelazine, thorazine [chlorpromazine hcl], abilify [aripiprazole], alka-seltzer [aspirin effervescent], allopurinol, biofreeze [menthol (topical analgesic)], capzasin [capsaicin], clindamycin/lincomycin, clove oil, colchicine, cough drops [benzocaine], diflucan [fluconazole], garlic, iohexol, ivp dye [iodinated diagnostic agents], lyrica [pregabalin], metamucil [psyllium], miralax [polyethylene glycol], myrbetriq [mirabegron], nsaids, other, potassium-containing compounds, prednisone, reglan [metoclopramide], renografin [diatrizoate], risperdal [risperidone], seroquel [quetiapine fumarate], simvastatin, urocit - k [potassium citrate], zyprexa [olanzapine], avelox [moxifloxacin hcl in nacl], barium-containing compounds, diclofenac, doxycycline, e-mycin [erythromycin base], fiorinal [butalbital-aspirin-caffeine], flagyl [metronidazole hcl], lamictal [lamotrigine], pregabalin, risperidone, and toradol [ketorolac tromethamine].  MEDICATIONS:  Current Outpatient Medications  Medication Sig Dispense Refill   acetaminophen (TYLENOL) 500 MG tablet Take 500 mg by mouth every 6 (six) hours as needed for moderate pain.     albuterol (VENTOLIN HFA) 108 (90 Base) MCG/ACT inhaler Inhale 2 puffs into the lungs every 6 (six) hours as needed for wheezing. 18 g 1   almotriptan (AXERT) 12.5 MG tablet TAKE 1 TABLET BY MOUTH AS NEEDED FOR MIGRAINE (MAX 2 TABS PER WEEK). THIS IS 90-DAY RX. PAYS CASH 24  tablet 0   bisacodyl (DULCOLAX) 5 MG EC tablet Take 10 mg by mouth daily as needed for mild constipation.      cyclobenzaprine (  FLEXERIL) 10 MG tablet Take 1 tablet (10 mg total) by mouth 3 (three) times daily. 90 tablet 4   docusate sodium (COLACE) 100 MG capsule Take 100 mg by mouth daily as needed for mild constipation.      EPINEPHrine 0.3 mg/0.3 mL IJ SOAJ injection Inject 0.3 mg into the muscle as needed for anaphylaxis. 2 each 0   fexofenadine (ALLEGRA) 180 MG tablet Take 1 tablet (180 mg total) by mouth daily. 30 tablet 0   gabapentin (NEURONTIN) 300 MG capsule Take 600 mg by mouth at bedtime.     HYDROcodone-acetaminophen (NORCO/VICODIN) 5-325 MG tablet Take 1 tablet by mouth every 8 (eight) hours as needed for moderate pain. 80 tablet 0   hyoscyamine (LEVSIN, ANASPAZ) 0.125 MG tablet Take 0.125 mg by mouth every 4 (four) hours as needed for bladder spasms or cramping.      LORazepam (ATIVAN) 1 MG tablet Take 1 mg by mouth 3 (three) times daily.     promethazine (PHENERGAN) 25 MG tablet TAKE 0.5 TABLETS (12.5 MG TOTAL) BY MOUTH EVERY 6 (SIX) HOURS AS NEEDED FOR NAUSEA OR VOMITING. 90 tablet 0   Trospium Chloride 60 MG CP24 Take 1 capsule by mouth every morning.      No current facility-administered medications for this visit.    PHYSICAL EXAMINATION: ECOG PERFORMANCE STATUS: 1 - Symptomatic but completely ambulatory  Vitals:   03/25/21 1141  BP: (!) 172/63  Pulse: (!) 108  Resp: 18  Temp: 99.5 F (37.5 C)  SpO2: 97%   Filed Weights   03/25/21 1141  Weight: 151 lb (68.5 kg)    GENERAL:alert, no distress and comfortable Musculoskeletal:no cyanosis of digits and no clubbing  NEURO: alert & oriented x 3 with fluent speech, no focal motor/sensory deficits  LABORATORY DATA:  I have reviewed the data as listed    Component Value Date/Time   NA 142 03/07/2021 1325   NA 139 03/24/2020 1000   NA 140 12/01/2014 1039   K 3.9 03/07/2021 1325   K 2.8 (LL) 12/01/2014 1039   CL  100 03/07/2021 1325   CO2 30 03/07/2021 1325   CO2 30 (H) 12/01/2014 1039   GLUCOSE 96 03/07/2021 1325   GLUCOSE 143 (H) 12/01/2014 1039   BUN 18 03/07/2021 1325   BUN 13 03/24/2020 1000   BUN 14.3 12/01/2014 1039   CREATININE 0.82 03/07/2021 1325   CREATININE 0.8 12/01/2014 1039   CALCIUM 10.2 03/07/2021 1325   CALCIUM 10.0 12/01/2014 1039   PROT 8.7 (H) 03/07/2021 1325   PROT 7.4 03/24/2020 1000   PROT 8.4 (H) 12/01/2014 1039   ALBUMIN 4.2 03/07/2021 1325   ALBUMIN 4.3 03/24/2020 1000   ALBUMIN 4.1 12/01/2014 1039   AST 36 03/07/2021 1325   AST 23 12/01/2014 1039   ALT 30 03/07/2021 1325   ALT 14 12/01/2014 1039   ALKPHOS 87 03/07/2021 1325   ALKPHOS 71 12/01/2014 1039   BILITOT 0.7 03/07/2021 1325   BILITOT 0.4 03/24/2020 1000   BILITOT 0.22 12/01/2014 1039   GFRNONAA >60 03/07/2021 1325   GFRAA 105 03/24/2020 1000    No results found for: SPEP, UPEP  Lab Results  Component Value Date   WBC 5.8 03/07/2021   NEUTROABS 4.1 03/07/2021   HGB 12.3 03/07/2021   HCT 38.4 03/07/2021   MCV 94.6 03/07/2021   PLT 258 03/07/2021      Chemistry      Component Value Date/Time   NA 142 03/07/2021 1325   NA  139 03/24/2020 1000   NA 140 12/01/2014 1039   K 3.9 03/07/2021 1325   K 2.8 (LL) 12/01/2014 1039   CL 100 03/07/2021 1325   CO2 30 03/07/2021 1325   CO2 30 (H) 12/01/2014 1039   BUN 18 03/07/2021 1325   BUN 13 03/24/2020 1000   BUN 14.3 12/01/2014 1039   CREATININE 0.82 03/07/2021 1325   CREATININE 0.8 12/01/2014 1039   GLU 87 11/03/2016 0000   GLU 87 11/03/2016 0000      Component Value Date/Time   CALCIUM 10.2 03/07/2021 1325   CALCIUM 10.0 12/01/2014 1039   ALKPHOS 87 03/07/2021 1325   ALKPHOS 71 12/01/2014 1039   AST 36 03/07/2021 1325   AST 23 12/01/2014 1039   ALT 30 03/07/2021 1325   ALT 14 12/01/2014 1039   BILITOT 0.7 03/07/2021 1325   BILITOT 0.4 03/24/2020 1000   BILITOT 0.22 12/01/2014 1039       RADIOGRAPHIC STUDIES: I have reviewed  multiple imaging studies with the patient I have personally reviewed the radiological images as listed and agreed with the findings in the report. NM PET Image Restage (PS) Skull Base to Thigh  Result Date: 03/24/2021 CLINICAL DATA:  Subsequent treatment strategy for follicular lymphoma, grade 1 of intrapelvic lymph node. History of renal cell carcinoma. EXAM: NUCLEAR MEDICINE PET SKULL BASE TO THIGH TECHNIQUE: 7.6 mCi F-18 FDG was injected intravenously. Full-ring PET imaging was performed from the skull base to thigh after the radiotracer. CT data was obtained and used for attenuation correction and anatomic localization. Fasting blood glucose: 86 mg/dl COMPARISON:  03/06/2016 PET. Abdominopelvic CT from [facility] dated 11/19/2018. FINDINGS: Mediastinal blood pool activity: SUV max 2.4 Liver activity: SUV max 3.4 NECK: No areas of abnormal hypermetabolism. Incidental CT findings: No cervical adenopathy. CHEST: No pulmonary parenchymal hypermetabolism. Right paratracheal node measures 9 mm and a S.U.V. max of 3.9 on 57/4 . (Previously 7 mm and a S.U.V. max of 2.5.) A node within the azygoesophageal recess measures 8 mm and a S.U.V. max of 3.8 on 66/4 . (Previously 4 mm and not significantly hypermetabolic.) Incidental CT findings: Lad and left circumflex coronary artery calcification. Aortic atherosclerosis. Vague 4 mm right middle lobe pulmonary nodule on 38/8 is unchanged and considered benign. ABDOMEN/PELVIS: Left periaortic node measures 6 mm and a S.U.V. max of 3.2 on 108/4 . (Previously 7 mm and a S.U.V. max of 4.4.) Retrocaval node measures 9 mm and a S.U.V. max of 4.6 on 108/4 . (Previously 11 mm and a S.U.V. max of 6.3 Portcaval node measures 1.2 cm and a S.U.V. max of 4.1 on 97/4 . (Previously 1.0 cm and a S.U.V. max of 5.6.) Ileocolic mesenteric nodes measure a S.U.V. max of 5.6 on 129/4 and are newly hypermetabolic since the prior. No pelvic sidewall nodal hypermetabolism. No splenic  hypermetabolism. Incidental CT findings: Cholecystectomy. Left nephrectomy. Fat containing diminutive ventral abdominal wall hernias. Hysterectomy. Pelvic floor laxity. SKELETON: No abnormal marrow activity. Incidental CT findings: Cervical spine fixation. IMPRESSION: 1. Mild disease progression within the chest. 2. Minimal decrease in nodal size and hypermetabolism within abdominal nodes. An isolated newly hypermetabolic ileocolic mesenteric node is seen. 3.  ([place]) 4 4. Coronary artery atherosclerosis. Aortic Atherosclerosis (ICD10-I70.0). 5. Left nephrectomy. Electronically Signed   By: Abigail Miyamoto M.D.   On: 03/24/2021 14:15

## 2021-03-28 ENCOUNTER — Telehealth: Payer: Self-pay | Admitting: Physician Assistant

## 2021-03-28 DIAGNOSIS — K469 Unspecified abdominal hernia without obstruction or gangrene: Secondary | ICD-10-CM

## 2021-03-28 NOTE — Telephone Encounter (Signed)
Referral ok

## 2021-03-28 NOTE — Addendum Note (Signed)
Addended by: Mickel Crow on: 03/28/2021 04:47 PM   Modules accepted: Orders

## 2021-03-28 NOTE — Telephone Encounter (Signed)
Referral has been placed. AS, CMA 

## 2021-03-28 NOTE — Telephone Encounter (Signed)
Patient states she has hernias in her stomach. She needs to have surgery and get a referral. Dr Hassell Done is with [facility] is who she would like the surgery with. Patient has an appointment for her surgery May 06 2021.   Patient is off her patch and she was weaned off of it, it was for spinal pain. The last patch was Feb 02 2021. Patient wanted to let her PCP know. Patient had a cat scan done from the base of her skull down to her thigh. Patient is staying hydrated. Patient has two different types of cancer that are in remission.  If patient needs to be contacted, any day buy Wednesday this week is okay to contact patient.  Thanks

## 2021-03-28 NOTE — Telephone Encounter (Signed)
Ok to place referral since patient already has an appointment scheduled.  Thank you, Herb Grays

## 2021-03-29 ENCOUNTER — Telehealth: Payer: Self-pay | Admitting: *Deleted

## 2021-03-29 ENCOUNTER — Telehealth: Payer: Self-pay | Admitting: Hematology and Oncology

## 2021-03-29 DIAGNOSIS — M797 Fibromyalgia: Secondary | ICD-10-CM

## 2021-03-29 DIAGNOSIS — G894 Chronic pain syndrome: Secondary | ICD-10-CM

## 2021-03-29 MED ORDER — HYDROCODONE-ACETAMINOPHEN 5-325 MG PO TABS: 1.0000 | ORAL_TABLET | Freq: Three times a day (TID) | ORAL | 0 refills | Status: DC | PRN

## 2021-03-29 NOTE — Telephone Encounter (Signed)
Scheduled appointment per 06/24 sch msg. Patient is aware.

## 2021-03-29 NOTE — Telephone Encounter (Signed)
PMP was Reviewed: Hydrocodone e-scribed today. Patty Salas appointment was rescheduled due diarrhea and vomiting. Patty Salas is aware of the above.

## 2021-03-29 NOTE — Telephone Encounter (Signed)
Patty Salas is having diarrhea and vomiting with taking her medrol dosepak started yesterday.  Covid negx2.  Patty Salas asked that she reschedule for 2 weeks.  She has #14 Norco.

## 2021-03-30 ENCOUNTER — Encounter: Payer: Medicare Other | Admitting: Registered Nurse

## 2021-04-13 ENCOUNTER — Encounter: Payer: Medicare Other | Admitting: Registered Nurse

## 2021-04-14 ENCOUNTER — Telehealth: Payer: Self-pay | Admitting: Physician Assistant

## 2021-04-14 ENCOUNTER — Other Ambulatory Visit: Payer: Self-pay | Admitting: Neurology

## 2021-04-14 ENCOUNTER — Telehealth: Payer: Self-pay | Admitting: Physical Medicine & Rehabilitation

## 2021-04-14 NOTE — Telephone Encounter (Signed)
Patient would like a referral to [place] [facility] today. She has an appointment tomorrow because she got her appointment moved up. Fax is [phone].

## 2021-04-14 NOTE — Telephone Encounter (Signed)
Patient wants to know what to do about her pain medication if she gets scheduled for surgery. Reminded patient that if the surgeon gives her pain medication she is to report it to the office and stop the pain medication that is prescribed to her by this office and take what the surgeon gives her. After she is done with the pain medication that the surgeon gives her she can go back to the pain medication that is prescribed to her at this office.

## 2021-04-14 NOTE — Telephone Encounter (Signed)
This referral was placed 03/28/21 and is good for 1 year. No need to place new referral. AS, CMA

## 2021-04-14 NOTE — Telephone Encounter (Signed)
Patient needs to speak to clinical staff about her pain contract.  She has a consult with a surgeon tomorrow and needs to ask some questions about it.

## 2021-04-15 ENCOUNTER — Ambulatory Visit: Payer: Self-pay | Admitting: Surgery

## 2021-04-15 NOTE — H&P (View-Only) (Signed)
Patty Salas [id number]   Referring Provider:  Self   Subjective   Chief Complaint: Hernia     History of Present Illness:    65 year old woman with multiple medical problems (see [facility] epic), history of follicular lymphoma of mesenteric, periportal and retroperitoneal lymph nodes, as well as kidney cancer, chronic pain on contract, chronic nausea, who presents with chief complaint of hernia Her abdominal surgical history includes abdominal hysterectomy, appendectomy, cholecystectomy, robot-assisted laparoscopic left nephrectomy with retroperitoneal lymph node dissection being the most recent operation which was in 2017.  Dr. Zettie Pho note documented mild adhesions in the infraumbilical region consistent with known prior gynecologic surgery and a single loop of bowel that was adherent to the abdominal wall well below the umbilicus. Last abdominal imaging was a PET/CT a month ago which I have reviewed, and this shows 3 defects in the upper midline containing likely omentum.  She experiences significant pain from this hernia.  It is inhibiting her from enjoying her life, she is not able to travel, not able to do any of the exercises that she had previously been doing to address her other problems, keeps her in the bathroom vomiting, and in general her goal is to scale back as much as she possibly can on her pain medication regimen.   Review of Systems: A complete review of systems was obtained from the patient.  I have reviewed this information and discussed as appropriate with the patient.  See HPI as well for other ROS.   Medical History: Past Medical History:  Diagnosis Date   Anemia    Anxiety    Arthritis    Asthma, unspecified asthma severity, unspecified whether complicated, unspecified whether persistent    History of cancer    Hypertension     Patient Active Problem List  Diagnosis   Abdominal pain   Bipolar 1 disorder, mixed, moderate (CMS-HCC)   BPPV (benign  paroxysmal positional vertigo)   Cervical spondylosis without myelopathy   De Quervain's tenosynovitis, right   Depression with anxiety   Enlarged lymph node   Family history of colon cancer in father   Fibromyalgia   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CMS-HCC)   Follicular lymphoma (CMS-HCC)   Herpes simplex   History of adenomatous polyp of colon   History of anaphylaxis   History of kidney cancer   Hypertension   Internal hemorrhoids   Irritable bowel syndrome with diarrhea   Left lateral epicondylitis   Lumbar degenerative disc disease   Lumbar spondylosis   Migraine aura, persistent, intractable   Nephrolithiasis   Renal mass   PAT (paroxysmal atrial tachycardia) (CMS-HCC)   Personal history of renal cell carcinoma   Post concussion syndrome   Postmenopausal syndrome   Renal cell cancer (CMS-HCC)   Vitamin D deficiency   Carpal tunnel syndrome   Wrist tendonitis    Past Surgical History:  Procedure Laterality Date   APPENDECTOMY     CHOLECYSTECTOMY     HYSTERECTOMY       Allergies  Allergen Reactions   Aripiprazole Anaphylaxis and Other (See Comments)    Stiffened muscles Stiffened muscles, extrapyramidal effects all over body.  Stiffened muscles Stiffened muscles, extrapyramidal effects all over body.     Aspirin Anaphylaxis, Hives and Itching   Bromfenac Anaphylaxis   Chlorpromazine Anaphylaxis and Other (See Comments)    Stiffened muscles Stiffened muscles    Divalproex Sodium Anaphylaxis   Duloxetine Anaphylaxis    Suicidal if in  combination w/ Axert Suicidal if in combination w/ Axert    Flurazepam Anaphylaxis   Hydrochlorothiazide Anaphylaxis    Pt loses facial movement uncontrolled;  Pt loses facial movement uncontrolled;     Latex Anaphylaxis   Metoclopramide Anaphylaxis, Headache and Other (See Comments)    headache Headaches.  headache Headaches.     Olanzapine Anaphylaxis   Penicillins Anaphylaxis    Quetiapine Anaphylaxis and Other (See Comments)    Bad dreams, too sedating. extreme fatigue and bad dreams extreme fatigue and bad dreams Bad dreams, too sedating.    Statins-Hmg-Coa Reductase Inhibitors Anaphylaxis and Other (See Comments)    Paranoid affect, muscle spasms.  Paranoid affect, muscle spasms.     Sumatriptan Anaphylaxis   Thioridazine Anaphylaxis   Topiramate Anaphylaxis and Other (See Comments)    Confusion, tremor, blurred vision Confusion, tremor, blurred vision    Trifluoperazine Anaphylaxis and Other (See Comments)    Stiffened muscles Stiffened muscles    Flurazepam Hcl Other (See Comments)    Stiffened all muscles   Other Itching, Nausea And Vomiting, Other (See Comments), Rash and Hives    Potassium-containing Compounds-Undiluted through IV. --Pain.  Dust, mold, feathers, wool, flannel, cologne, chlorox, household cleaning products, scented candles, cinnamon, ivory soap, paints, sun sentitive, acidic fruits, ( lemons, limes, strawbery, tartrazine yellow#5 and 6 in foods, MSG food additives, sudiium lauryl sulates, hot peppers, spearmint, wintergreen peppermint, mentol, orange, jalapenos. ---headache, breathing, welps, canker sores inside mouth, uticaria Hives on back and looked like sun Dust, mold, feathers, wool, flannel, cologne, chlorox, household cleaning products, scented candles, cinnamon, ivory soap, paints, sun sentitive, acidic fruits, ( lemons, limes, strawbery, tartrazine yellow#5 and 6 in foods, MSG food additives, sudiium lauryl sulates, hot peppers, spearmint, wintergreen peppermint, mentol, orange, jalapenos. ---headache, breathing, welps, canker sores inside mouth, uticaria    Allopurinol Hives   Aspirin-Sod Bicarb-Citric Acid Other (See Comments)    Mouth ulcers, swallowing problems.  Mouth ulcers, swallowing problems.     Barium Sulfate Itching   Benzocaine Other (See Comments)    orthicoat sprays containing menthol or lemon flavor--breaks  inside of mouth out, red rash, mouth ulcers.  orthicoat sprays containing menthol or lemon flavor--breaks inside of mouth out, red rash, mouth ulcers.     Capsaicin Itching    Redness Redness    Clindamycin Itching   Clove Oil Itching   Colchicine Hives   Cycloheximide Itching   Darifenacin Hives   Diatrizoate Meg-Diatrizoat Sod Itching   Diatrizoate Meglumine Other (See Comments)    welps  welps     Erythromycin Unknown   Fluconazole Hives and Itching   Garlic Itching   Iohexol Other (See Comments)     Code: HIVES, Desc: pt broke out in red rash and hives after CT injection on 12/07/09.-pt needs 13 hr prep kit, Onset Date: [id number]  Code: HIVES, Desc: pt broke out in red rash and hives after CT injection on 12/07/09.-pt needs 13 hr prep kit, Onset Date: [id number]    Mirabegron Other (See Comments)    swelling swelling    Moxifloxacin Itching   Nsaids (Non-Steroidal Anti-Inflammatory Drug) Other (See Comments)    Mouth ulcers, swelling of mouth.     Polyethylene Glycol 300 Nausea And Vomiting   Polyethylene Glycol 3350 Itching   Potassium Chloride Itching   Potassium Citrate Other (See Comments)   Prednisone Itching and Other (See Comments)    No  Sleep at night.  No  Sleep at night.     Psyllium Other (  See Comments)    Mouth ulcers, swallowing problems. Mouth ulcers, swallowing problems.    Red Dye Unknown   Butalbital-Aspirin-Caffeine Itching and Rash    Welps, Welps,    Diclofenac Rash   Doxycycline Nausea    Stomach upset.  Stomach upset.     Erythromycin Base Nausea   Ketorolac Tromethamine Itching and Rash   Lamotrigine Rash   Metronidazole Nausea And Vomiting   Pregabalin Other (See Comments)    Blurry vision, muscle stiffness  Blurry vision, muscle stiffness     Risperidone Other (See Comments)    Too sedating.  Too sedating Too sedating Too sedating.      Current Outpatient Medications on File Prior to Visit  Medication Sig Dispense Refill    almotriptan (AXERT) 12.5 MG tablet almotriptan malate 12.5 mg tablet  TAKE 1 TABLET (12.5 MG TOTAL) BY MOUTH AS NEEDED FOR MIGRAINE (MAX 2 TABS PER WEEK).     cyclobenzaprine (FLEXERIL) 10 MG tablet cyclobenzaprine 10 mg tablet  TAKE 1 TABLET BY MOUTH THREE TIMES A DAY     EPINEPHrine (EPIPEN) 0.3 mg/0.3 mL auto-injector epinephrine 0.3 mg/0.3 mL injection, auto-injector  INJECT 0.3 MLS INTO THE MUSCLE AS NEEDED FOR ANAPHYLAXIS. NEED OFFICE VISIT     HYDROcodone-acetaminophen (NORCO) 5-325 mg tablet hydrocodone 5 mg-acetaminophen 325 mg tablet     promethazine (PHENERGAN) 25 MG tablet promethazine 25 mg tablet  TAKE 0.5 TABLETS (12.5 MG TOTAL) BY MOUTH EVERY 6 (SIX) HOURS AS NEEDED FOR NAUSEA OR VOMITING.     acetaminophen (TYLENOL) 500 MG tablet Take by mouth     albuterol 90 mcg/actuation inhaler INHALE 2 PUFFS BY MOUTH INTO THE LUNGS EVERY 6 HOURS AS NEEDED FOR WHEEZE     bisacodyL (DULCOLAX) 5 mg EC tablet daily     diphenhydrAMINE (BENADRYL ALLERGY) 12.5 mg/5 mL solution daily     docusate (COLACE) 100 MG capsule Take by mouth     fentaNYL (DURAGESIC) 12 mcg/hr patch PLACE 1 PATCH ONTO THE SKIN EVERY 3 DAYS.     fexofenadine (ALLEGRA) 180 MG tablet daily     gabapentin (NEURONTIN) 300 MG capsule gabapentin 300 mg capsule  TAKE 2 CAPSULES AT BEDTIME     hyoscyamine (LEVSIN) 0.125 mg tablet Take by mouth     ketorolac (TORADOL) 60 mg/2 mL injection ketorolac 60 mg/2 mL intramuscular solution  Inject 25m IM now     levoFLOXacin (LEVAQUIN) 500 MG tablet TAKE 1 TABLET (500 MG TOTAL) BY MOUTH DAILY.     lidocaine 4 % PtMd Lidocaine Pain Relief 4 % topical patch  Apply 1 patch to affected area for 12 hours then remove     LORazepam (ATIVAN) 1 MG tablet lorazepam 1 mg tablet  TAKE 1 TABLET BY MOUTH THREE TIMES A DAY     methylPREDNISolone (MEDROL DOSEPACK) 4 mg tablet as directed     oxybutynin (DITROPAN-XL) 10 MG XL tablet Take 10 mg by mouth once daily     potassium chloride (KLOR-CON) 20  MEQ ER tablet Take by mouth     trospium (SANCTURA XR) 60 mg XR capsule TAKE 1 CAPSULE BY MOUTH DAILY FOR URINARY AGENCY     No current facility-administered medications on file prior to visit.    Family History  Problem Relation Age of Onset   Deep vein thrombosis (DVT or abnormal blood clot formation) Mother    High blood pressure (Hypertension) Father    Hyperlipidemia (Elevated cholesterol) Father    Coronary Artery Disease (Blocked arteries around  heart) Father    Colon cancer Father    High blood pressure (Hypertension) Brother      Social History   Tobacco Use  Smoking Status Never Smoker  Smokeless Tobacco Never Used     Social History   Socioeconomic History   Marital status: Unknown  Tobacco Use   Smoking status: Never Smoker   Smokeless tobacco: Never Used  Scientific laboratory technician Use: Never used  Substance and Sexual Activity   Alcohol use: Never   Drug use: Never   Sexual activity: Defer    Objective:    Vitals:   04/15/21 1531  BP: 130/88  Pulse: 106  Temp: 37 C (98.6 F)  SpO2: 94%  Weight: 69.7 kg (153 lb 9.6 oz)  Height: 165.1 cm (5' 5" )    Body mass index is 25.56 kg/m.  Alert, calm, cooperative Unlabored respirations Abdomen is soft, nondistended, focally tender in the upper midline where there is a vaguely palpable incarcerated hernia.  Fascial defect is not palpable.    Assessment and Plan:  Diagnoses and all orders for this visit:  Incisional hernia without obstruction or gangrene    We discussed the relevant anatomy and options for repair.  I offered a laparoscopic repair with likely laparoscopic lysis of adhesions, discussed possibility of converting to open surgery, use of mesh, risks of bleeding, infection, pain, scarring, injury to intra-abdominal structures, hernia recurrence, failure to resolve all of her presenting symptoms, as well as general cardiovascular, pulmonary and thromboembolic complications.  Questions were  welcomed and answered.  She wishes to proceed with surgery.  We will schedule as soon as possible.   Alpheus Stiff Raquel James, MD

## 2021-04-15 NOTE — H&P (Signed)
Patty Salas [id number]   Referring Provider:  Self   Subjective   Chief Complaint: Hernia     History of Present Illness:    65 year old woman with multiple medical problems (see [facility] epic), history of follicular lymphoma of mesenteric, periportal and retroperitoneal lymph nodes, as well as kidney cancer, chronic pain on contract, chronic nausea, who presents with chief complaint of hernia Her abdominal surgical history includes abdominal hysterectomy, appendectomy, cholecystectomy, robot-assisted laparoscopic left nephrectomy with retroperitoneal lymph node dissection being the most recent operation which was in 2017.  Dr. Zettie Pho note documented mild adhesions in the infraumbilical region consistent with known prior gynecologic surgery and a single loop of bowel that was adherent to the abdominal wall well below the umbilicus. Last abdominal imaging was a PET/CT a month ago which I have reviewed, and this shows 3 defects in the upper midline containing likely omentum.  She experiences significant pain from this hernia.  It is inhibiting her from enjoying her life, she is not able to travel, not able to do any of the exercises that she had previously been doing to address her other problems, keeps her in the bathroom vomiting, and in general her goal is to scale back as much as she possibly can on her pain medication regimen.   Review of Systems: A complete review of systems was obtained from the patient.  I have reviewed this information and discussed as appropriate with the patient.  See HPI as well for other ROS.   Medical History: Past Medical History:  Diagnosis Date   Anemia    Anxiety    Arthritis    Asthma, unspecified asthma severity, unspecified whether complicated, unspecified whether persistent    History of cancer    Hypertension     Patient Active Problem List  Diagnosis   Abdominal pain   Bipolar 1 disorder, mixed, moderate (CMS-HCC)   BPPV (benign  paroxysmal positional vertigo)   Cervical spondylosis without myelopathy   De Quervain's tenosynovitis, right   Depression with anxiety   Enlarged lymph node   Family history of colon cancer in father   Fibromyalgia   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CMS-HCC)   Follicular lymphoma (CMS-HCC)   Herpes simplex   History of adenomatous polyp of colon   History of anaphylaxis   History of kidney cancer   Hypertension   Internal hemorrhoids   Irritable bowel syndrome with diarrhea   Left lateral epicondylitis   Lumbar degenerative disc disease   Lumbar spondylosis   Migraine aura, persistent, intractable   Nephrolithiasis   Renal mass   PAT (paroxysmal atrial tachycardia) (CMS-HCC)   Personal history of renal cell carcinoma   Post concussion syndrome   Postmenopausal syndrome   Renal cell cancer (CMS-HCC)   Vitamin D deficiency   Carpal tunnel syndrome   Wrist tendonitis    Past Surgical History:  Procedure Laterality Date   APPENDECTOMY     CHOLECYSTECTOMY     HYSTERECTOMY       Allergies  Allergen Reactions   Aripiprazole Anaphylaxis and Other (See Comments)    Stiffened muscles Stiffened muscles, extrapyramidal effects all over body.  Stiffened muscles Stiffened muscles, extrapyramidal effects all over body.     Aspirin Anaphylaxis, Hives and Itching   Bromfenac Anaphylaxis   Chlorpromazine Anaphylaxis and Other (See Comments)    Stiffened muscles Stiffened muscles    Divalproex Sodium Anaphylaxis   Duloxetine Anaphylaxis    Suicidal if in  combination w/ Axert Suicidal if in combination w/ Axert    Flurazepam Anaphylaxis   Hydrochlorothiazide Anaphylaxis    Pt loses facial movement uncontrolled;  Pt loses facial movement uncontrolled;     Latex Anaphylaxis   Metoclopramide Anaphylaxis, Headache and Other (See Comments)    headache Headaches.  headache Headaches.     Olanzapine Anaphylaxis   Penicillins Anaphylaxis    Quetiapine Anaphylaxis and Other (See Comments)    Bad dreams, too sedating. extreme fatigue and bad dreams extreme fatigue and bad dreams Bad dreams, too sedating.    Statins-Hmg-Coa Reductase Inhibitors Anaphylaxis and Other (See Comments)    Paranoid affect, muscle spasms.  Paranoid affect, muscle spasms.     Sumatriptan Anaphylaxis   Thioridazine Anaphylaxis   Topiramate Anaphylaxis and Other (See Comments)    Confusion, tremor, blurred vision Confusion, tremor, blurred vision    Trifluoperazine Anaphylaxis and Other (See Comments)    Stiffened muscles Stiffened muscles    Flurazepam Hcl Other (See Comments)    Stiffened all muscles   Other Itching, Nausea And Vomiting, Other (See Comments), Rash and Hives    Potassium-containing Compounds-Undiluted through IV. --Pain.  Dust, mold, feathers, wool, flannel, cologne, chlorox, household cleaning products, scented candles, cinnamon, ivory soap, paints, sun sentitive, acidic fruits, ( lemons, limes, strawbery, tartrazine yellow#5 and 6 in foods, MSG food additives, sudiium lauryl sulates, hot peppers, spearmint, wintergreen peppermint, mentol, orange, jalapenos. ---headache, breathing, welps, canker sores inside mouth, uticaria Hives on back and looked like sun Dust, mold, feathers, wool, flannel, cologne, chlorox, household cleaning products, scented candles, cinnamon, ivory soap, paints, sun sentitive, acidic fruits, ( lemons, limes, strawbery, tartrazine yellow#5 and 6 in foods, MSG food additives, sudiium lauryl sulates, hot peppers, spearmint, wintergreen peppermint, mentol, orange, jalapenos. ---headache, breathing, welps, canker sores inside mouth, uticaria    Allopurinol Hives   Aspirin-Sod Bicarb-Citric Acid Other (See Comments)    Mouth ulcers, swallowing problems.  Mouth ulcers, swallowing problems.     Barium Sulfate Itching   Benzocaine Other (See Comments)    orthicoat sprays containing menthol or lemon flavor--breaks  inside of mouth out, red rash, mouth ulcers.  orthicoat sprays containing menthol or lemon flavor--breaks inside of mouth out, red rash, mouth ulcers.     Capsaicin Itching    Redness Redness    Clindamycin Itching   Clove Oil Itching   Colchicine Hives   Cycloheximide Itching   Darifenacin Hives   Diatrizoate Meg-Diatrizoat Sod Itching   Diatrizoate Meglumine Other (See Comments)    welps  welps     Erythromycin Unknown   Fluconazole Hives and Itching   Garlic Itching   Iohexol Other (See Comments)     Code: HIVES, Desc: pt broke out in red rash and hives after CT injection on 12/07/09.-pt needs 13 hr prep kit, Onset Date: [id number]  Code: HIVES, Desc: pt broke out in red rash and hives after CT injection on 12/07/09.-pt needs 13 hr prep kit, Onset Date: [id number]    Mirabegron Other (See Comments)    swelling swelling    Moxifloxacin Itching   Nsaids (Non-Steroidal Anti-Inflammatory Drug) Other (See Comments)    Mouth ulcers, swelling of mouth.     Polyethylene Glycol 300 Nausea And Vomiting   Polyethylene Glycol 3350 Itching   Potassium Chloride Itching   Potassium Citrate Other (See Comments)   Prednisone Itching and Other (See Comments)    No  Sleep at night.  No  Sleep at night.     Psyllium Other (  See Comments)    Mouth ulcers, swallowing problems. Mouth ulcers, swallowing problems.    Red Dye Unknown   Butalbital-Aspirin-Caffeine Itching and Rash    Welps, Welps,    Diclofenac Rash   Doxycycline Nausea    Stomach upset.  Stomach upset.     Erythromycin Base Nausea   Ketorolac Tromethamine Itching and Rash   Lamotrigine Rash   Metronidazole Nausea And Vomiting   Pregabalin Other (See Comments)    Blurry vision, muscle stiffness  Blurry vision, muscle stiffness     Risperidone Other (See Comments)    Too sedating.  Too sedating Too sedating Too sedating.      Current Outpatient Medications on File Prior to Visit  Medication Sig Dispense Refill    almotriptan (AXERT) 12.5 MG tablet almotriptan malate 12.5 mg tablet  TAKE 1 TABLET (12.5 MG TOTAL) BY MOUTH AS NEEDED FOR MIGRAINE (MAX 2 TABS PER WEEK).     cyclobenzaprine (FLEXERIL) 10 MG tablet cyclobenzaprine 10 mg tablet  TAKE 1 TABLET BY MOUTH THREE TIMES A DAY     EPINEPHrine (EPIPEN) 0.3 mg/0.3 mL auto-injector epinephrine 0.3 mg/0.3 mL injection, auto-injector  INJECT 0.3 MLS INTO THE MUSCLE AS NEEDED FOR ANAPHYLAXIS. NEED OFFICE VISIT     HYDROcodone-acetaminophen (NORCO) 5-325 mg tablet hydrocodone 5 mg-acetaminophen 325 mg tablet     promethazine (PHENERGAN) 25 MG tablet promethazine 25 mg tablet  TAKE 0.5 TABLETS (12.5 MG TOTAL) BY MOUTH EVERY 6 (SIX) HOURS AS NEEDED FOR NAUSEA OR VOMITING.     acetaminophen (TYLENOL) 500 MG tablet Take by mouth     albuterol 90 mcg/actuation inhaler INHALE 2 PUFFS BY MOUTH INTO THE LUNGS EVERY 6 HOURS AS NEEDED FOR WHEEZE     bisacodyL (DULCOLAX) 5 mg EC tablet daily     diphenhydrAMINE (BENADRYL ALLERGY) 12.5 mg/5 mL solution daily     docusate (COLACE) 100 MG capsule Take by mouth     fentaNYL (DURAGESIC) 12 mcg/hr patch PLACE 1 PATCH ONTO THE SKIN EVERY 3 DAYS.     fexofenadine (ALLEGRA) 180 MG tablet daily     gabapentin (NEURONTIN) 300 MG capsule gabapentin 300 mg capsule  TAKE 2 CAPSULES AT BEDTIME     hyoscyamine (LEVSIN) 0.125 mg tablet Take by mouth     ketorolac (TORADOL) 60 mg/2 mL injection ketorolac 60 mg/2 mL intramuscular solution  Inject 22m IM now     levoFLOXacin (LEVAQUIN) 500 MG tablet TAKE 1 TABLET (500 MG TOTAL) BY MOUTH DAILY.     lidocaine 4 % PtMd Lidocaine Pain Relief 4 % topical patch  Apply 1 patch to affected area for 12 hours then remove     LORazepam (ATIVAN) 1 MG tablet lorazepam 1 mg tablet  TAKE 1 TABLET BY MOUTH THREE TIMES A DAY     methylPREDNISolone (MEDROL DOSEPACK) 4 mg tablet as directed     oxybutynin (DITROPAN-XL) 10 MG XL tablet Take 10 mg by mouth once daily     potassium chloride (KLOR-CON) 20  MEQ ER tablet Take by mouth     trospium (SANCTURA XR) 60 mg XR capsule TAKE 1 CAPSULE BY MOUTH DAILY FOR URINARY AGENCY     No current facility-administered medications on file prior to visit.    Family History  Problem Relation Age of Onset   Deep vein thrombosis (DVT or abnormal blood clot formation) Mother    High blood pressure (Hypertension) Father    Hyperlipidemia (Elevated cholesterol) Father    Coronary Artery Disease (Blocked arteries around  heart) Father    Colon cancer Father    High blood pressure (Hypertension) Brother      Social History   Tobacco Use  Smoking Status Never Smoker  Smokeless Tobacco Never Used     Social History   Socioeconomic History   Marital status: Unknown  Tobacco Use   Smoking status: Never Smoker   Smokeless tobacco: Never Used  Scientific laboratory technician Use: Never used  Substance and Sexual Activity   Alcohol use: Never   Drug use: Never   Sexual activity: Defer    Objective:    Vitals:   04/15/21 1531  BP: 130/88  Pulse: 106  Temp: 37 C (98.6 F)  SpO2: 94%  Weight: 69.7 kg (153 lb 9.6 oz)  Height: 165.1 cm (5' 5" )    Body mass index is 25.56 kg/m.  Alert, calm, cooperative Unlabored respirations Abdomen is soft, nondistended, focally tender in the upper midline where there is a vaguely palpable incarcerated hernia.  Fascial defect is not palpable.    Assessment and Plan:  Diagnoses and all orders for this visit:  Incisional hernia without obstruction or gangrene    We discussed the relevant anatomy and options for repair.  I offered a laparoscopic repair with likely laparoscopic lysis of adhesions, discussed possibility of converting to open surgery, use of mesh, risks of bleeding, infection, pain, scarring, injury to intra-abdominal structures, hernia recurrence, failure to resolve all of her presenting symptoms, as well as general cardiovascular, pulmonary and thromboembolic complications.  Questions were  welcomed and answered.  She wishes to proceed with surgery.  We will schedule as soon as possible.   Emera Bussie Raquel James, MD

## 2021-04-20 ENCOUNTER — Telehealth: Payer: Self-pay

## 2021-04-20 NOTE — Progress Notes (Addendum)
COVID Vaccine Completed: yes x2 Date COVID Vaccine completed: 12/23/19, 01/15/20 Has received booster: COVID vaccine manufacturer: Pfizer       Date of COVID positive in last 90 days: N/A  PCP - Lorrene Reid, [place] Cardiologist -   Chest x-ray - N/A EKG - 09/08/20 Epic Stress Test - long time ago per pt ECHO - 10/07/20 Epic Cardiac Cath - N/A Pacemaker/ICD device last checked:N/a Spinal Cord Stimulator:N/A  Sleep Study - negative sleep apnea CPAP -   Fasting Blood Sugar - N/a Checks Blood Sugar _____ times a day  Blood Thinner Instructions: N/A Aspirin Instructions: Last Dose:  Activity level: Can go up a flight of stairs and perform activities of daily living without stopping and without symptoms of chest pain or shortness of breath. Pt does endorse getting tired quickly from fibromyalgia.       Pt reports she is spacing her medications out appropriately.   Anesthesia review: HTN, one kidney, pt reports she needs Lovenox when in the hospital, quarter sized skin tear to left shin no signs of infection.  Patient denies shortness of breath, fever, cough and chest pain at PAT appointment   Patient verbalized understanding of instructions that were given to them at the PAT appointment. Patient was also instructed that they will need to review over the PAT instructions again at home before surgery.

## 2021-04-20 NOTE — Patient Instructions (Addendum)
DUE TO COVID-19 ONLY ONE VISITOR IS ALLOWED TO COME WITH YOU AND STAY IN THE WAITING ROOM ONLY DURING PRE OP AND PROCEDURE.   **NO VISITORS ARE ALLOWED IN THE SHORT STAY AREA OR RECOVERY ROOM!!**  IF YOU WILL BE ADMITTED INTO THE HOSPITAL YOU ARE ALLOWED ONLY TWO SUPPORT PEOPLE DURING VISITATION HOURS ONLY (10AM -8PM)   The support person(s) may change daily. The support person(s) must pass our screening, gel in and out, and wear a mask at all times, including in the patient's room. Patients must also wear a mask when staff or their support person are in the room.  No visitors under the age of 69. Any visitor under the age of 26 must be accompanied by an adult.    COVID SWAB TESTING MUST BE COMPLETED ON:  11/22/20 @ 2:30 PM   [address] [place], [place] [address]   [place] (backside of the building) You are not required to quarantine, however you are required to wear a well-fitted mask when you are out and around people not in your household.  Hand Hygiene often Do NOT share personal items Notify your provider if you are in close contact with someone who has COVID or you develop fever 100.4 or greater, new onset of sneezing, cough, sore throat, shortness of breath or body aches.       Your procedure is scheduled on: 04/22/21   Report to [facility] Main  Entrance    Report to admitting at 7:00 AM   Call this number if you have problems the morning of surgery [phone]   Do not eat food :After Midnight.   May have liquids until 6:00 AM day of surgery  CLEAR LIQUID DIET  Foods Allowed                                                                     Foods Excluded  Water, Black Coffee and tea, regular and decaf              liquids that you cannot  Plain Jell-O in any flavor  (No red)                                    see through such as: Fruit ices (not with fruit pulp)                                            milk, soups, orange juice               Iced Popsicles (No red)                                                All solid food  Apple juices Sports drinks like Gatorade (No red) Lightly seasoned clear broth or consume(fat free) Sugar, honey syrup  Oral Hygiene is also important to reduce your risk of infection.                                    Remember - BRUSH YOUR TEETH THE MORNING OF SURGERY WITH YOUR REGULAR TOOTHPASTE   Take these medicines the morning of surgery with A SIP OF WATER: Acetaminophen, Cyclobenzaprine, Allegra, Norco, Hyoscyamine, Lorazepam, Promethazine, Inhalers                              You may not have any metal on your body including hair pins, jewelry, and body piercing             Do not wear make-up, lotions, powders, perfumes, or deodorant  Do not wear nail polish including gel and S&S, artificial/acrylic nails, or any other type of covering on natural nails including finger and toenails. If you have artificial nails, gel coating, etc. that needs to be removed by a nail salon please have this removed prior to surgery or surgery may need to be canceled/ delayed if the surgeon/ anesthesia feels like they are unable to be safely monitored.   Do not shave  48 hours prior to surgery.    Do not bring valuables to the hospital. [place].   Contacts, dentures or bridgework may not be worn into surgery.   Bring small overnight bag day of surgery.   Please read over the following fact sheets you were given: IF YOU HAVE QUESTIONS ABOUT YOUR PRE OP INSTRUCTIONS PLEASE CALL [place] - Preparing for Surgery Before surgery, you can play an important role.  Because skin is not sterile, your skin needs to be as free of germs as possible.  You can reduce the number of germs on your skin by washing with CHG (chlorahexidine gluconate) soap before surgery.  CHG is an antiseptic cleaner which kills  germs and bonds with the skin to continue killing germs even after washing. Please DO NOT use if you have an allergy to CHG or antibacterial soaps.  If your skin becomes reddened/irritated stop using the CHG and inform your nurse when you arrive at Short Stay. Do not shave (including legs and underarms) for at least 48 hours prior to the first CHG shower.  You may shave your face/neck.  Please follow these instructions carefully:  1.  Shower with CHG Soap the night before surgery and the  morning of surgery.  2.  If you choose to wash your hair, wash your hair first as usual with your normal  shampoo.  3.  After you shampoo, rinse your hair and body thoroughly to remove the shampoo.                             4.  Use CHG as you would any other liquid soap.  You can apply chg directly to the skin and wash.  Gently with a scrungie or clean washcloth.  5.  Apply the CHG Soap to your body ONLY FROM THE NECK DOWN.   Do   not use on face/ open  Wound or open sores. Avoid contact with eyes, ears mouth and   genitals (private parts).                       Wash face,  Genitals (private parts) with your normal soap.             6.  Wash thoroughly, paying special attention to the area where your    surgery  will be performed.  7.  Thoroughly rinse your body with warm water from the neck down.  8.  DO NOT shower/wash with your normal soap after using and rinsing off the CHG Soap.                9.  Pat yourself dry with a clean towel.            10.  Wear clean pajamas.            11.  Place clean sheets on your bed the night of your first shower and do not  sleep with pets. Day of Surgery : Do not apply any lotions/deodorants the morning of surgery.  Please wear clean clothes to the hospital/surgery center.  FAILURE TO FOLLOW THESE INSTRUCTIONS MAY RESULT IN THE CANCELLATION OF YOUR SURGERY  PATIENT SIGNATURE_________________________________  NURSE  SIGNATURE__________________________________  ________________________________________________________________________

## 2021-04-20 NOTE — Telephone Encounter (Signed)
Patty Salas calling to speak to Dr.Swartz about an upcoming surgery she is going to be having and how it interacts with her medication she didn't leave details she said she would preffer to speak to The provider .  Please advise , her call back number is [phone]

## 2021-04-21 ENCOUNTER — Encounter (HOSPITAL_COMMUNITY): Payer: Self-pay

## 2021-04-21 ENCOUNTER — Other Ambulatory Visit: Payer: Self-pay

## 2021-04-21 ENCOUNTER — Other Ambulatory Visit (HOSPITAL_COMMUNITY)
Admission: RE | Admit: 2021-04-21 | Discharge: 2021-04-21 | Disposition: A | Discharge status: Home / Self Care | Payer: Medicare Other | Source: Ambulatory Visit | Attending: Surgery | Admitting: Surgery

## 2021-04-21 ENCOUNTER — Encounter (HOSPITAL_COMMUNITY)
Admission: RE | Admit: 2021-04-21 | Discharge: 2021-04-21 | Disposition: A | Discharge status: Home / Self Care | Payer: Medicare Other | Source: Ambulatory Visit | Attending: Surgery | Admitting: Surgery

## 2021-04-21 DIAGNOSIS — Z20822 Contact with and (suspected) exposure to covid-19: Secondary | ICD-10-CM | POA: Insufficient documentation

## 2021-04-21 DIAGNOSIS — Z01812 Encounter for preprocedural laboratory examination: Secondary | ICD-10-CM | POA: Insufficient documentation

## 2021-04-21 HISTORY — DX: Pneumonia, unspecified organism: J18.9

## 2021-04-21 LAB — BASIC METABOLIC PANEL
Anion gap: 8 (ref 5–15)
BUN: 16 mg/dL (ref 8–23)
CO2: 28 mmol/L (ref 22–32)
Calcium: 9.3 mg/dL (ref 8.9–10.3)
Chloride: 102 mmol/L (ref 98–111)
Creatinine, Ser: 0.74 mg/dL (ref 0.44–1.00)
GFR, Estimated: >60 mL/min (ref >60)
Glucose, Bld: 83 mg/dL (ref 70–99)
Potassium: 4.1 mmol/L (ref 3.5–5.1)
Sodium: 138 mmol/L (ref 135–145)

## 2021-04-21 LAB — CBC
HCT: 36.4 % (ref 36.0–46.0)
Hemoglobin: 11.4 g/dL — ABNORMAL LOW (ref 12.0–15.0)
MCH: 30.5 pg (ref 26.0–34.0)
MCHC: 31.3 g/dL (ref 30.0–36.0)
MCV: 97.3 fL (ref 80.0–100.0)
Platelets: 269 10*3/uL (ref 150–400)
RBC: 3.74 MIL/uL — ABNORMAL LOW (ref 3.87–5.11)
RDW: 13.2 % (ref 11.5–15.5)
WBC: 4.8 10*3/uL (ref 4.0–10.5)
nRBC: 0 % (ref 0.0–0.2)

## 2021-04-21 LAB — SARS CORONAVIRUS 2 (TAT 6-24 HRS): SARS Coronavirus 2: NEGATIVE

## 2021-04-21 MED ORDER — BUPIVACAINE LIPOSOME 1.3 % IJ SUSP
20.0000 mL | Freq: Once | INTRAMUSCULAR | Status: DC
  Filled 2021-04-21: qty 20

## 2021-04-22 ENCOUNTER — Inpatient Hospital Stay (HOSPITAL_COMMUNITY)
Admission: RE | Admit: 2021-04-22 | Discharge: 2021-04-25 | DRG: 337 | Disposition: A | Discharge status: Home / Self Care | Payer: Medicare Other | Attending: Surgery | Admitting: Surgery

## 2021-04-22 ENCOUNTER — Encounter (HOSPITAL_COMMUNITY): Payer: Self-pay | Admitting: Surgery

## 2021-04-22 ENCOUNTER — Ambulatory Visit (HOSPITAL_COMMUNITY): Payer: Medicare Other | Admitting: Physician Assistant

## 2021-04-22 ENCOUNTER — Encounter (HOSPITAL_COMMUNITY)
Admission: RE | Disposition: A | Discharge status: Home / Self Care | Payer: Self-pay | Source: Home / Self Care | Attending: Surgery

## 2021-04-22 ENCOUNTER — Ambulatory Visit (HOSPITAL_COMMUNITY): Payer: Medicare Other | Admitting: Certified Registered Nurse Anesthetist

## 2021-04-22 ENCOUNTER — Other Ambulatory Visit: Payer: Self-pay

## 2021-04-22 DIAGNOSIS — Z20822 Contact with and (suspected) exposure to covid-19: Secondary | ICD-10-CM | POA: Diagnosis present

## 2021-04-22 DIAGNOSIS — K660 Peritoneal adhesions (postprocedural) (postinfection): Secondary | ICD-10-CM | POA: Diagnosis present

## 2021-04-22 DIAGNOSIS — Z880 Allergy status to penicillin: Secondary | ICD-10-CM

## 2021-04-22 DIAGNOSIS — Z800 Family history of malignant neoplasm of digestive organs: Secondary | ICD-10-CM

## 2021-04-22 DIAGNOSIS — Z8249 Family history of ischemic heart disease and other diseases of the circulatory system: Secondary | ICD-10-CM

## 2021-04-22 DIAGNOSIS — I10 Essential (primary) hypertension: Secondary | ICD-10-CM | POA: Diagnosis present

## 2021-04-22 DIAGNOSIS — K430 Incisional hernia with obstruction, without gangrene: Principal | ICD-10-CM | POA: Diagnosis present

## 2021-04-22 DIAGNOSIS — Z9889 Other specified postprocedural states: Secondary | ICD-10-CM

## 2021-04-22 DIAGNOSIS — Z888 Allergy status to other drugs, medicaments and biological substances status: Secondary | ICD-10-CM

## 2021-04-22 DIAGNOSIS — Z91040 Latex allergy status: Secondary | ICD-10-CM

## 2021-04-22 DIAGNOSIS — Z8719 Personal history of other diseases of the digestive system: Secondary | ICD-10-CM

## 2021-04-22 DIAGNOSIS — Z886 Allergy status to analgesic agent status: Secondary | ICD-10-CM

## 2021-04-22 DIAGNOSIS — Z91041 Radiographic dye allergy status: Secondary | ICD-10-CM

## 2021-04-22 DIAGNOSIS — Z9109 Other allergy status, other than to drugs and biological substances: Secondary | ICD-10-CM

## 2021-04-22 DIAGNOSIS — Z79899 Other long term (current) drug therapy: Secondary | ICD-10-CM

## 2021-04-22 DIAGNOSIS — Z881 Allergy status to other antibiotic agents status: Secondary | ICD-10-CM

## 2021-04-22 HISTORY — PX: INCISIONAL HERNIA REPAIR: SHX193

## 2021-04-22 HISTORY — PX: LAPAROSCOPIC LYSIS OF ADHESIONS: SHX5905

## 2021-04-22 SURGERY — REPAIR, HERNIA, INCISIONAL, LAPAROSCOPIC
Anesthesia: General

## 2021-04-22 MED ORDER — POLYVINYL ALCOHOL 1.4 % OP SOLN
1.0000 [drp] | Freq: Three times a day (TID) | OPHTHALMIC | Status: DC | PRN
  Filled 2021-04-22: qty 15

## 2021-04-22 MED ORDER — 0.9 % SODIUM CHLORIDE (POUR BTL) OPTIME
TOPICAL | Status: DC | PRN
  Administered 2021-04-22: 1000 mL

## 2021-04-22 MED ORDER — LIDOCAINE 2% (20 MG/ML) 5 ML SYRINGE
INTRAMUSCULAR | Status: DC | PRN
  Administered 2021-04-22: 20 mg via INTRAVENOUS

## 2021-04-22 MED ORDER — DIPHENHYDRAMINE HCL 25 MG PO CAPS: 25.0000 mg | ORAL_CAPSULE | Freq: Four times a day (QID) | ORAL | Status: DC | PRN

## 2021-04-22 MED ORDER — LACTATED RINGERS IR SOLN
Status: DC | PRN
  Administered 2021-04-22: 1000 mL

## 2021-04-22 MED ORDER — DEXAMETHASONE SODIUM PHOSPHATE 10 MG/ML IJ SOLN
INTRAMUSCULAR | Status: DC | PRN
  Administered 2021-04-22: 10 mg via INTRAVENOUS

## 2021-04-22 MED ORDER — PROPOFOL 10 MG/ML IV BOLUS
INTRAVENOUS | Status: AC
  Filled 2021-04-22: qty 20

## 2021-04-22 MED ORDER — ONDANSETRON HCL 4 MG/2ML IJ SOLN
INTRAMUSCULAR | Status: AC
  Filled 2021-04-22: qty 2

## 2021-04-22 MED ORDER — METOPROLOL TARTRATE 5 MG/5ML IV SOLN: 5.0000 mg | Freq: Four times a day (QID) | INTRAVENOUS | Status: DC | PRN

## 2021-04-22 MED ORDER — SODIUM CHLORIDE 0.9 % IV SOLN: INTRAVENOUS | Status: DC | PRN

## 2021-04-22 MED ORDER — GABAPENTIN 300 MG PO CAPS
300.0000 mg | ORAL_CAPSULE | ORAL | Status: AC
  Administered 2021-04-22: 300 mg via ORAL
  Filled 2021-04-22: qty 1

## 2021-04-22 MED ORDER — ALBUTEROL SULFATE (2.5 MG/3ML) 0.083% IN NEBU
3.0000 mL | INHALATION_SOLUTION | Freq: Four times a day (QID) | RESPIRATORY_TRACT | Status: DC | PRN
  Administered 2021-04-23: 3 mL via RESPIRATORY_TRACT
  Filled 2021-04-22: qty 3

## 2021-04-22 MED ORDER — ONDANSETRON 4 MG PO TBDP
4.0000 mg | ORAL_TABLET | Freq: Four times a day (QID) | ORAL | Status: DC | PRN
  Administered 2021-04-23 – 2021-04-24 (×3): 4 mg via ORAL
  Filled 2021-04-22 (×3): qty 1

## 2021-04-22 MED ORDER — DOCUSATE SODIUM 100 MG PO CAPS: 100.0000 mg | ORAL_CAPSULE | Freq: Every day | ORAL | Status: DC | PRN

## 2021-04-22 MED ORDER — HYDROMORPHONE HCL 1 MG/ML IJ SOLN
INTRAMUSCULAR | Status: AC
  Filled 2021-04-22: qty 1

## 2021-04-22 MED ORDER — HYDRALAZINE HCL 20 MG/ML IJ SOLN
10.0000 mg | INTRAMUSCULAR | Status: DC | PRN
Start: 2021-04-22 — End: 2021-04-23

## 2021-04-22 MED ORDER — ENOXAPARIN SODIUM 40 MG/0.4ML IJ SOSY
40.0000 mg | PREFILLED_SYRINGE | INTRAMUSCULAR | Status: DC
  Administered 2021-04-23 – 2021-04-25 (×3): 40 mg via SUBCUTANEOUS
  Filled 2021-04-22 (×3): qty 0.4

## 2021-04-22 MED ORDER — MIDAZOLAM HCL 2 MG/2ML IJ SOLN: 0.5000 mg | Freq: Once | INTRAMUSCULAR | Status: DC | PRN

## 2021-04-22 MED ORDER — CHLORHEXIDINE GLUCONATE CLOTH 2 % EX PADS: 6.0000 | MEDICATED_PAD | Freq: Once | CUTANEOUS | Status: DC

## 2021-04-22 MED ORDER — HYDROCODONE-ACETAMINOPHEN 5-325 MG PO TABS
1.0000 | ORAL_TABLET | Freq: Three times a day (TID) | ORAL | Status: DC | PRN
  Administered 2021-04-23: 1 via ORAL
  Filled 2021-04-22: qty 1

## 2021-04-22 MED ORDER — SUCCINYLCHOLINE CHLORIDE 200 MG/10ML IV SOSY
PREFILLED_SYRINGE | INTRAVENOUS | Status: AC
  Filled 2021-04-22: qty 10

## 2021-04-22 MED ORDER — LORAZEPAM 1 MG PO TABS
1.0000 mg | ORAL_TABLET | Freq: Three times a day (TID) | ORAL | Status: DC
  Administered 2021-04-22 – 2021-04-25 (×9): 1 mg via ORAL
  Filled 2021-04-22 (×9): qty 1

## 2021-04-22 MED ORDER — ONDANSETRON HCL 4 MG/2ML IJ SOLN
INTRAMUSCULAR | Status: DC | PRN
  Administered 2021-04-22 (×2): 4 mg via INTRAVENOUS

## 2021-04-22 MED ORDER — HYOSCYAMINE SULFATE 0.125 MG PO TABS
0.1250 mg | ORAL_TABLET | ORAL | Status: DC | PRN
  Filled 2021-04-22: qty 1

## 2021-04-22 MED ORDER — GABAPENTIN 300 MG PO CAPS
600.0000 mg | ORAL_CAPSULE | Freq: Every day | ORAL | Status: DC
  Administered 2021-04-22 – 2021-04-24 (×3): 600 mg via ORAL
  Filled 2021-04-22 (×3): qty 2

## 2021-04-22 MED ORDER — PROMETHAZINE HCL 25 MG PO TABS
12.5000 mg | ORAL_TABLET | Freq: Four times a day (QID) | ORAL | Status: DC | PRN
  Administered 2021-04-24: 12.5 mg via ORAL
  Filled 2021-04-22: qty 1

## 2021-04-22 MED ORDER — DIPHENHYDRAMINE HCL 50 MG/ML IJ SOLN: 25.0000 mg | Freq: Four times a day (QID) | INTRAMUSCULAR | Status: DC | PRN

## 2021-04-22 MED ORDER — BUPIVACAINE LIPOSOME 1.3 % IJ SUSP
INTRAMUSCULAR | Status: DC | PRN
  Administered 2021-04-22: 20 mL

## 2021-04-22 MED ORDER — VANCOMYCIN HCL IN DEXTROSE 1-5 GM/200ML-% IV SOLN
1000.0000 mg | INTRAVENOUS | Status: AC
  Administered 2021-04-22: 1000 mg via INTRAVENOUS
  Filled 2021-04-22: qty 200

## 2021-04-22 MED ORDER — PROPOFOL 10 MG/ML IV BOLUS
INTRAVENOUS | Status: DC | PRN
  Administered 2021-04-22: 150 mg via INTRAVENOUS

## 2021-04-22 MED ORDER — PROMETHAZINE HCL 25 MG/ML IJ SOLN: 6.2500 mg | INTRAMUSCULAR | Status: DC | PRN

## 2021-04-22 MED ORDER — BISACODYL 5 MG PO TBEC: 10.0000 mg | DELAYED_RELEASE_TABLET | Freq: Every day | ORAL | Status: DC | PRN

## 2021-04-22 MED ORDER — DEXAMETHASONE SODIUM PHOSPHATE 10 MG/ML IJ SOLN
INTRAMUSCULAR | Status: AC
  Filled 2021-04-22: qty 1

## 2021-04-22 MED ORDER — FENTANYL CITRATE (PF) 100 MCG/2ML IJ SOLN
INTRAMUSCULAR | Status: DC | PRN
  Administered 2021-04-22 (×5): 50 ug via INTRAVENOUS

## 2021-04-22 MED ORDER — PHENYLEPHRINE 40 MCG/ML (10ML) SYRINGE FOR IV PUSH (FOR BLOOD PRESSURE SUPPORT)
PREFILLED_SYRINGE | INTRAVENOUS | Status: DC | PRN
  Administered 2021-04-22 (×2): 80 ug via INTRAVENOUS

## 2021-04-22 MED ORDER — FENTANYL CITRATE (PF) 250 MCG/5ML IJ SOLN
INTRAMUSCULAR | Status: AC
  Filled 2021-04-22: qty 5

## 2021-04-22 MED ORDER — SODIUM CHLORIDE 0.9 % IV SOLN: INTRAVENOUS | Status: DC

## 2021-04-22 MED ORDER — SUCCINYLCHOLINE CHLORIDE 200 MG/10ML IV SOSY
PREFILLED_SYRINGE | INTRAVENOUS | Status: DC | PRN
  Administered 2021-04-22: 120 mg via INTRAVENOUS

## 2021-04-22 MED ORDER — BUPIVACAINE-EPINEPHRINE (PF) 0.25% -1:200000 IJ SOLN
INTRAMUSCULAR | Status: AC
  Filled 2021-04-22: qty 30

## 2021-04-22 MED ORDER — SCOPOLAMINE 1 MG/3DAYS TD PT72
MEDICATED_PATCH | TRANSDERMAL | Status: DC | PRN
  Administered 2021-04-22: 1 via TRANSDERMAL

## 2021-04-22 MED ORDER — TROSPIUM CHLORIDE ER 60 MG PO CP24: 1.0000 | ORAL_CAPSULE | Freq: Every morning | ORAL | Status: DC

## 2021-04-22 MED ORDER — ACETAMINOPHEN 500 MG PO TABS
1000.0000 mg | ORAL_TABLET | ORAL | Status: AC
  Administered 2021-04-22: 1000 mg via ORAL
  Filled 2021-04-22: qty 2

## 2021-04-22 MED ORDER — LIDOCAINE 2% (20 MG/ML) 5 ML SYRINGE
INTRAMUSCULAR | Status: AC
  Filled 2021-04-22: qty 5

## 2021-04-22 MED ORDER — HYDROMORPHONE HCL 1 MG/ML IJ SOLN
0.5000 mg | INTRAMUSCULAR | Status: DC | PRN
  Administered 2021-04-22 – 2021-04-23 (×5): 0.5 mg via INTRAVENOUS
  Filled 2021-04-22 (×5): qty 0.5

## 2021-04-22 MED ORDER — OXYCODONE HCL 5 MG/5ML PO SOLN
5.0000 mg | Freq: Once | ORAL | Status: DC | PRN
Start: 2021-04-22 — End: 2021-04-22

## 2021-04-22 MED ORDER — MEPERIDINE HCL 50 MG/ML IJ SOLN: 6.2500 mg | INTRAMUSCULAR | Status: DC | PRN

## 2021-04-22 MED ORDER — MIDAZOLAM HCL 5 MG/5ML IJ SOLN
INTRAMUSCULAR | Status: DC | PRN
  Administered 2021-04-22: 2 mg via INTRAVENOUS

## 2021-04-22 MED ORDER — CYCLOBENZAPRINE HCL 10 MG PO TABS
10.0000 mg | ORAL_TABLET | Freq: Three times a day (TID) | ORAL | Status: DC
  Administered 2021-04-22 – 2021-04-25 (×9): 10 mg via ORAL
  Filled 2021-04-22 (×9): qty 1

## 2021-04-22 MED ORDER — ROCURONIUM BROMIDE 10 MG/ML (PF) SYRINGE
PREFILLED_SYRINGE | INTRAVENOUS | Status: DC | PRN
  Administered 2021-04-22: 10 mg via INTRAVENOUS
  Administered 2021-04-22: 50 mg via INTRAVENOUS

## 2021-04-22 MED ORDER — BUPIVACAINE-EPINEPHRINE 0.25% -1:200000 IJ SOLN
INTRAMUSCULAR | Status: DC | PRN
  Administered 2021-04-22: 30 mL

## 2021-04-22 MED ORDER — PHENYLEPHRINE HCL-NACL 10-0.9 MG/250ML-% IV SOLN
INTRAVENOUS | Status: DC | PRN
  Administered 2021-04-22: 35 ug/min via INTRAVENOUS

## 2021-04-22 MED ORDER — HYDROMORPHONE HCL 1 MG/ML IJ SOLN
0.2500 mg | INTRAMUSCULAR | Status: DC | PRN
  Administered 2021-04-22 (×2): 0.5 mg via INTRAVENOUS

## 2021-04-22 MED ORDER — SUGAMMADEX SODIUM 200 MG/2ML IV SOLN
INTRAVENOUS | Status: DC | PRN
  Administered 2021-04-22: 200 mg via INTRAVENOUS

## 2021-04-22 MED ORDER — SCOPOLAMINE 1 MG/3DAYS TD PT72
MEDICATED_PATCH | TRANSDERMAL | Status: AC
  Filled 2021-04-22: qty 1

## 2021-04-22 MED ORDER — ONDANSETRON HCL 4 MG/2ML IJ SOLN
4.0000 mg | Freq: Four times a day (QID) | INTRAMUSCULAR | Status: DC | PRN
  Administered 2021-04-22: 4 mg via INTRAVENOUS
  Filled 2021-04-22: qty 2

## 2021-04-22 MED ORDER — OXYCODONE HCL 5 MG PO TABS
5.0000 mg | ORAL_TABLET | Freq: Once | ORAL | Status: DC | PRN
Start: 2021-04-22 — End: 2021-04-22

## 2021-04-22 MED ORDER — MIDAZOLAM HCL 2 MG/2ML IJ SOLN
INTRAMUSCULAR | Status: AC
  Filled 2021-04-22: qty 2

## 2021-04-22 MED ORDER — ACETAMINOPHEN 500 MG PO TABS
1000.0000 mg | ORAL_TABLET | Freq: Four times a day (QID) | ORAL | Status: DC
  Administered 2021-04-22 (×2): 1000 mg via ORAL
  Filled 2021-04-22 (×3): qty 2

## 2021-04-22 MED ORDER — ROCURONIUM BROMIDE 10 MG/ML (PF) SYRINGE
PREFILLED_SYRINGE | INTRAVENOUS | Status: AC
  Filled 2021-04-22: qty 10

## 2021-04-22 SURGICAL SUPPLY — 32 items
BAG COUNTER SPONGE SURGICOUNT (BAG) ×2 IMPLANT
BENZOIN TINCTURE PRP APPL 2/3 (GAUZE/BANDAGES/DRESSINGS) ×2 IMPLANT
BINDER ABDOMINAL 12 ML 46-62 (SOFTGOODS) IMPLANT
BNDG ADH 1X3 SHEER STRL LF (GAUZE/BANDAGES/DRESSINGS) ×2 IMPLANT
CABLE HIGH FREQUENCY MONO STRZ (ELECTRODE) ×2 IMPLANT
CHLORAPREP W/TINT 26 (MISCELLANEOUS) ×2 IMPLANT
COVER SURGICAL LIGHT HANDLE (MISCELLANEOUS) ×2 IMPLANT
DECANTER SPIKE VIAL GLASS SM (MISCELLANEOUS) ×2 IMPLANT
DERMABOND ADVANCED (GAUZE/BANDAGES/DRESSINGS) ×1
DERMABOND ADVANCED .7 DNX12 (GAUZE/BANDAGES/DRESSINGS) ×1 IMPLANT
DEVICE SECURE STRAP 25 ABSORB (INSTRUMENTS) ×4 IMPLANT
ELECT REM PT RETURN 15FT ADLT (MISCELLANEOUS) ×2 IMPLANT
GOWN STRL REUS W/TWL LRG LVL3 (GOWN DISPOSABLE) ×2 IMPLANT
GOWN STRL REUS W/TWL XL LVL3 (GOWN DISPOSABLE) ×4 IMPLANT
GRASPER SUT TROCAR 14GX15 (MISCELLANEOUS) ×2 IMPLANT
KIT BASIN OR (CUSTOM PROCEDURE TRAY) ×2 IMPLANT
KIT TURNOVER KIT A (KITS) ×2 IMPLANT
MARKER SKIN DUAL TIP RULER LAB (MISCELLANEOUS) ×2 IMPLANT
MESH VENTRALIGHT ST 4X6IN (Mesh General) ×2 IMPLANT
PENCIL SMOKE EVACUATOR (MISCELLANEOUS) IMPLANT
SCISSORS LAP 5X35 DISP (ENDOMECHANICALS) ×2 IMPLANT
SET IRRIG TUBING LAPAROSCOPIC (IRRIGATION / IRRIGATOR) ×2 IMPLANT
SET TUBE SMOKE EVAC HIGH FLOW (TUBING) ×2 IMPLANT
SHEARS HARMONIC ACE PLUS 36CM (ENDOMECHANICALS) ×2 IMPLANT
SLEEVE XCEL OPT CAN 5 100 (ENDOMECHANICALS) ×2 IMPLANT
STRIP CLOSURE SKIN 1/2X4 (GAUZE/BANDAGES/DRESSINGS) ×2 IMPLANT
SUT ETHIBOND 0 (SUTURE) ×4 IMPLANT
SUT MNCRL AB 4-0 PS2 18 (SUTURE) ×2 IMPLANT
TOWEL OR 17X26 10 PK STRL BLUE (TOWEL DISPOSABLE) ×2 IMPLANT
TRAY LAPAROSCOPIC (CUSTOM PROCEDURE TRAY) ×2 IMPLANT
TROCAR BLADELESS OPT 5 100 (ENDOMECHANICALS) ×2 IMPLANT
TROCAR XCEL 12X100 BLDLESS (ENDOMECHANICALS) ×2 IMPLANT

## 2021-04-22 NOTE — Interval H&P Note (Signed)
History and Physical Interval Note:  04/22/2021 8:46 AM  Patty Salas  has presented today for surgery, with the diagnosis of [place].  The various methods of treatment have been discussed with the patient and family. After consideration of risks, benefits and other options for treatment, the patient has consented to  Procedure(s): [place] (N/A) LYSIS OF ADHESIONS (N/A) as a surgical intervention.  The patient's history has been reviewed, patient examined, no change in status, stable for surgery.  I have reviewed the patient's chart and labs.  Questions were answered to the patient's satisfaction.     Jeydi Klingel Rich Brave

## 2021-04-22 NOTE — Discharge Instructions (Addendum)
[facility] REPAIR: POST OP INSTRUCTIONS   EAT Gradually transition to a high fiber diet with a fiber supplement over the next few weeks after discharge.  Start with a pureed / full liquid diet (see below)  WALK Walk an hour a day (cumulative- not all at once).  Control your pain to do that.    CONTROL PAIN Control pain so that you can walk, sleep, tolerate sneezing/coughing, and go up/down stairs.  HAVE A BOWEL MOVEMENT DAILY Keep your bowels regular to avoid problems.  OK to try a laxative to override constipation.  OK to use an antidairrheal to slow down diarrhea.  Call if not better after 2 tries  CALL IF YOU HAVE PROBLEMS/CONCERNS Call if you are still struggling despite following these instructions. Call if you have concerns not answered by these instructions  ######################################################################    DIET: Follow a light bland diet & liquids the first 24 hours after arrival home, such as soup, liquids, starches, etc.  Be sure to drink plenty of fluids.  Quickly advance to a usual solid diet within a few days.  Avoid fast food or heavy meals as your are more likely to get nauseated or have irregular bowels.  A low-sugar, high-fiber diet for the rest of your life is ideal.   Take your usually prescribed home medications unless otherwise directed.  PAIN CONTROL: Pain is best controlled by a usual combination of three different methods TOGETHER: Ice/Heat Over the counter pain medication Prescription pain medication Most patients will experience some swelling and bruising around the hernia(s) such as the bellybutton, groins, or old incisions.  Ice packs or heating pads (30-60 minutes up to 6 times a day) will help. Use ice for the first few days to help decrease swelling and bruising, then switch to heat to help relax tight/sore spots and speed recovery.  Some people prefer to use ice alone, heat alone, alternating between ice & heat.  Experiment to what  works for you.  Swelling and bruising can take several weeks to resolve.   It is helpful to take an over-the-counter pain medication regularly for the first days: Naproxen (Aleve, etc)  Two 220mg  tabs twice a day OR Ibuprofen (Advil, etc) Three 200mg  tabs four times a day (every meal & bedtime) AND Acetaminophen (Tylenol, etc) 325-650mg  four times a day (every meal & bedtime) A  prescription for pain medication should be given to you upon discharge.  Take your pain medication as prescribed, IF NEEDED.  If you are having problems/concerns with the prescription medicine (does not control pain, nausea, vomiting, rash, itching, etc), please call us [phone] to see if we need to switch you to a different pain medicine that will work better for you and/or control your side effect better. If you need a refill on your pain medication, please contact your pharmacy.  They will contact our office to request authorization. Prescriptions will not be filled after 5 pm or on week-ends.  Avoid getting constipated.  Between the surgery and the pain medications, it is common to experience some constipation.  Increasing fluid intake and taking a fiber supplement (such as Metamucil, Citrucel, FiberCon, MiraLax, etc) 1-2 times a day regularly will usually help prevent this problem from occurring.  A mild laxative (prune juice, Milk of Magnesia, MiraLax, etc) should be taken according to package directions if there are no bowel movements after 48 hours.    Wash / shower every day, starting 2 days after surgery.  You may shower over the  steri strips which are waterproof.  No rubbing, scrubbing, lotions or ointments to incisions. Do not submerge.   Remove your outer bandaids 2 days after surgery. Steri strips will peel off after 1-2 weeks. You may leave the incisions open to air.  You may replace a dressing/Band-Aid to cover an incision for comfort if you wish.  Continue to shower over incision(s) after the dressing is  off. No soaking or swimming. No rubbing, scrubbing, lotions or ointments to incisions.  ACTIVITIES as tolerated:   You may resume regular (light) daily activities beginning the next day--such as daily self-care, walking, climbing stairs--gradually increasing activities as tolerated.  Control your pain so that you can walk an hour a day.  If you can walk 30 minutes without difficulty, it is safe to try more intense activity such as jogging, treadmill, bicycling, low-impact aerobics, swimming, etc. Refrain from the most intensive and strenuous activity such as sit-ups, heavy lifting, contact sports, etc  Refrain from any heavy lifting or straining until 6 weeks after surgery.   DO NOT PUSH THROUGH PAIN.  Let pain be your guide: If it hurts to do something, don't do it.  Pain is your body warning you to avoid that activity for another week until the pain goes down. Wearing your abdominal binder will help support the abdominal wall and will help reduce pain-wear this whenever you are up and moving around. You may drive when you are no longer taking prescription pain medication, you can comfortably wear a seatbelt, and you can safely maneuver your car and apply brakes. You may have sexual intercourse when it is comfortable.   FOLLOW UP in our office Please call [facility] at [phone] to set up an appointment to see your surgeon in the office for a follow-up appointment approximately 2-3 weeks after your surgery. Make sure that you call for this appointment the day you arrive home to insure a convenient appointment time.  9.  If you have disability of FMLA / Family leave forms, please bring the forms to the office for processing.  (do not give to your surgeon).  WHEN TO CALL us [phone]: Poor pain control Reactions / problems with new medications (rash/itching, nausea, etc)  Fever over 101.5 F (38.5 C) Inability to urinate Nausea and/or vomiting Worsening swelling or bruising Continued bleeding  from incision. Increased pain, redness, or drainage from the incision   The clinic staff is available to answer your questions during regular business hours (8:30am-5pm).  Please don't hesitate to call and ask to speak to one of our nurses for clinical concerns.   If you have a medical emergency, go to the nearest emergency room or call 911.  A surgeon from [facility] is always on call at the hospitals in [facility], [place], [place], [place], [place]  [address] ?  [address] [address], [place], [place]   [address] MAIN: [phone] ? TOLL FREE: [phone] ? FAX: [phone] [email or web address]

## 2021-04-22 NOTE — Anesthesia Postprocedure Evaluation (Signed)
Anesthesia Post Note  Patient: Patty Salas  Procedure(s) Performed: LAPAROSCOPIC [place] WITH MESH LYSIS OF ADHESIONS     Patient location during evaluation: PACU Anesthesia Type: General Level of consciousness: patient cooperative, oriented and sedated Pain management: pain level controlled Vital Signs Assessment: post-procedure vital signs reviewed and stable Respiratory status: spontaneous breathing, nonlabored ventilation, respiratory function stable and patient connected to nasal cannula oxygen Cardiovascular status: blood pressure returned to baseline and stable Postop Assessment: no apparent nausea or vomiting Anesthetic complications: no   No notable events documented.  Last Vitals:  Vitals:   04/22/21 1415 04/22/21 1500  BP: (!) 117/55 (!) 112/55  Pulse: 89 92  Resp: 16 16  Temp: 37 C 36.8 C  SpO2: 99%     Last Pain:  Vitals:   04/22/21 1500  TempSrc: Oral  PainSc:                  Reggie Bise,E. Keshun Berrett

## 2021-04-22 NOTE — Plan of Care (Signed)

## 2021-04-22 NOTE — Anesthesia Preprocedure Evaluation (Addendum)
Anesthesia Evaluation  Patient identified by MRN, date of birth, ID band Patient awake    Reviewed: Allergy & Precautions, NPO status , Patient's Chart, lab work & pertinent test results  History of Anesthesia Complications (+) PONV  Airway Mallampati: II  TM Distance: >3 FB Neck ROM: Full    Dental  (+) Edentulous Upper, Edentulous Lower   Pulmonary asthma , COPD (rarely uses inhaler),  04/21/2021 SARS coronavirus NEG   breath sounds clear to auscultation       Cardiovascular hypertension, (-) angina Rhythm:Regular Rate:Normal  10/2020 ECHO: EF 60-65%, normal LVF, normal RVF, no significant valvular abnormalities   Neuro/Psych  Headaches, Anxiety Depression Bipolar Disorder    GI/Hepatic Neg liver ROS, abd pain   Endo/Other  negative endocrine ROS  Renal/GU negative Renal ROS     Musculoskeletal  (+) Arthritis , Fibromyalgia -, narcotic dependent  Abdominal   Peds  Hematology negative hematology ROS (+)   Anesthesia Other Findings   Reproductive/Obstetrics                            Anesthesia Physical Anesthesia Plan  ASA: 3  Anesthesia Plan: General   Post-op Pain Management:    Induction: Intravenous  PONV Risk Score and Plan: 4 or greater and Ondansetron, Dexamethasone and Treatment may vary due to age or medical condition  Airway Management Planned: Oral ETT  Additional Equipment: None  Intra-op Plan:   Post-operative Plan: Extubation in OR  Informed Consent: I have reviewed the patients History and Physical, chart, labs and discussed the procedure including the risks, benefits and alternatives for the proposed anesthesia with the patient or authorized representative who has indicated his/her understanding and acceptance.       Plan Discussed with: CRNA and Surgeon  Anesthesia Plan Comments:        Anesthesia Quick Evaluation

## 2021-04-22 NOTE — Anesthesia Procedure Notes (Signed)
Procedure Name: Intubation Date/Time: 04/22/2021 9:48 AM Performed by: Maxwell Caul, CRNA Pre-anesthesia Checklist: Patient identified, Emergency Drugs available, Suction available and Patient being monitored Patient Re-evaluated:Patient Re-evaluated prior to induction Oxygen Delivery Method: Circle system utilized Preoxygenation: Pre-oxygenation with 100% oxygen Induction Type: IV induction, Rapid sequence and Cricoid Pressure applied Laryngoscope Size: Mac and 4 Grade View: Grade I Tube type: Oral Tube size: 7.5 mm Number of attempts: 1 Airway Equipment and Method: Stylet Placement Confirmation: ETT inserted through vocal cords under direct vision, positive ETCO2 and breath sounds checked- equal and bilateral Secured at: 21 cm Tube secured with: Tape Dental Injury: Teeth and Oropharynx as per pre-operative assessment

## 2021-04-22 NOTE — Op Note (Signed)
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  One unavoidable small serosal tear repaired with a seromuscular 3-0 Vicryl   Specimens: no Retained items: no  EBL: minimal cc Complications: none   Description of procedure: After obtaining informed consent the patient was taken to the operating room and placed supine on operating room table where general endotracheal anesthesia was initiated, preoperative antibiotics were administered, SCDs applied, and a formal timeout was performed.  The abdomen was prepped and draped in usual sterile fashion.  Optical entry via the left upper quadrant was followed by insufflation to 15 mm regular without incident.  Gross inspection demonstrated no injury from our entry and extensive omental and small bowel adhesions along the anterior abdominal wall.  Bilateral laparoscopic assisted taps blocks were performed with Exparel mixed with quarter percent Marcaine and epinephrine.  Under direct visualization a 12 mm and a 5 mm trocar were placed along the left hemiabdomen.  Combination of blunt dissection, cold scissors, and harmonic scalpel were used to clear the anterior abdominal wall of adhesions, reducing incarcerated omental fat from 2 of the incisional hernia defects while doing so.  There are approximately 4 small fascial defects noted.  In the lower midline and to the left of the lower midline there are very dense adhesions of small bowel to the anterior abdominal wall.  These were carefully taken down with cold scissors and gentle blunt dissection.  There was  an unavoidable subcentimeter serosal tear noted and this was oversewn with a seromuscular 3-0 Vicryl.  The falciform ligament was taken down using the harmonic scalpel.  The hernia defects were then mapped out and a 4 inch x 6 inch coated Ventralight mesh was selected.  The rough side was marked and stay sutures of 0 Ethibond placed in the 4 cardinal directions.  This was then inserted through the 12 Miller trocar and unfurled and confirmed to be appropriately oriented with the right side facing the abdominal wall and the coated side facing the viscera.  The laparoscopic suture passer was then used to bring the stay sutures through the abdominal wall securing the mesh transvaginally in the 4 cardinal directions.  The secure strap tacker was then used to affix the mesh further to the anterior abdominal wall, and outer crown and several inner tacks were placed.  The mesh on conclusion is flush against the abdominal wall with no exposed rough surface.  The abdominal cavity was then once again inspected and hemostasis confirmed.  The small bowel that had been lysed from the abdominal wall was reinspected and appears intact and viable.  The 12 mm trocar site was closed with a transvaginal 0 Vicryl using laparoscopic suture passer under direct visualization.  The abdomen was then desufflated and the remaining trochars removed.  Skin incisions were closed with subcuticular 4 Monocryl, benzoin and Steri-Strips.  The patient was then awakened, extubated and taken to PACU in stable condition.    All counts were correct at the completion of the case.

## 2021-04-22 NOTE — Transfer of Care (Signed)
Immediate Anesthesia Transfer of Care Note  Patient: Patty Salas  Procedure(s) Performed: LAPAROSCOPIC [place] WITH MESH LYSIS OF ADHESIONS  Patient Location: PACU  Anesthesia Type:General  Level of Consciousness: awake, alert  and oriented  Airway & Oxygen Therapy: Patient Spontanous Breathing and Patient connected to face mask oxygen  Post-op Assessment: Report given to RN and Post -op Vital signs reviewed and stable  Post vital signs: Reviewed and stable  Last Vitals:  Vitals Value Taken Time  BP 101/62 04/22/21 1154  Temp    Pulse 92 04/22/21 1155  Resp 12 04/22/21 1155  SpO2 98 % 04/22/21 1155  Vitals shown include unvalidated device data.  Last Pain:  Vitals:   04/22/21 0704  TempSrc:   PainSc: 2       Patients Stated Pain Goal: 3 ([id number])  Complications: No notable events documented.

## 2021-04-23 LAB — BASIC METABOLIC PANEL
Anion gap: 6 (ref 5–15)
BUN: 12 mg/dL (ref 8–23)
CO2: 26 mmol/L (ref 22–32)
Calcium: 8.7 mg/dL — ABNORMAL LOW (ref 8.9–10.3)
Chloride: 106 mmol/L (ref 98–111)
Creatinine, Ser: 0.83 mg/dL (ref 0.44–1.00)
GFR, Estimated: >60 mL/min (ref >60)
Glucose, Bld: 136 mg/dL — ABNORMAL HIGH (ref 70–99)
Potassium: 4.3 mmol/L (ref 3.5–5.1)
Sodium: 138 mmol/L (ref 135–145)

## 2021-04-23 LAB — CBC
HCT: 31.7 % — ABNORMAL LOW (ref 36.0–46.0)
Hemoglobin: 10 g/dL — ABNORMAL LOW (ref 12.0–15.0)
MCH: 31.1 pg (ref 26.0–34.0)
MCHC: 31.5 g/dL (ref 30.0–36.0)
MCV: 98.4 fL (ref 80.0–100.0)
Platelets: 245 10*3/uL (ref 150–400)
RBC: 3.22 MIL/uL — ABNORMAL LOW (ref 3.87–5.11)
RDW: 13.2 % (ref 11.5–15.5)
WBC: 9.7 10*3/uL (ref 4.0–10.5)
nRBC: 0 % (ref 0.0–0.2)

## 2021-04-23 LAB — MAGNESIUM: Magnesium: 1.7 mg/dL (ref 1.7–2.4)

## 2021-04-23 MED ORDER — ACETAMINOPHEN 325 MG PO TABS
650.0000 mg | ORAL_TABLET | Freq: Four times a day (QID) | ORAL | Status: DC
  Administered 2021-04-23 – 2021-04-25 (×5): 650 mg via ORAL
  Filled 2021-04-23 (×5): qty 2

## 2021-04-23 MED ORDER — MAGNESIUM SULFATE 2 GM/50ML IV SOLN
2.0000 g | Freq: Once | INTRAVENOUS | Status: AC
  Administered 2021-04-23: 2 g via INTRAVENOUS
  Filled 2021-04-23: qty 50

## 2021-04-23 MED ORDER — BOOST / RESOURCE BREEZE PO LIQD CUSTOM
1.0000 | Freq: Three times a day (TID) | ORAL | Status: DC
  Administered 2021-04-23 – 2021-04-24 (×3): 1 via ORAL

## 2021-04-23 MED ORDER — HYDROMORPHONE HCL 1 MG/ML IJ SOLN
0.5000 mg | INTRAMUSCULAR | Status: DC | PRN
  Administered 2021-04-23 – 2021-04-24 (×2): 0.5 mg via INTRAVENOUS
  Filled 2021-04-23 (×2): qty 0.5

## 2021-04-23 MED ORDER — HYDROCODONE-ACETAMINOPHEN 5-325 MG PO TABS
1.0000 | ORAL_TABLET | Freq: Four times a day (QID) | ORAL | Status: DC | PRN
  Administered 2021-04-23 (×2): 1 via ORAL
  Administered 2021-04-24: 2 via ORAL
  Administered 2021-04-24: 1 via ORAL
  Administered 2021-04-24 – 2021-04-25 (×3): 2 via ORAL
  Filled 2021-04-23 (×4): qty 2
  Filled 2021-04-23 (×2): qty 1
  Filled 2021-04-23: qty 2
  Filled 2021-04-23: qty 1
  Filled 2021-04-23: qty 2

## 2021-04-23 NOTE — Progress Notes (Signed)
1 Day Post-Op   Subjective/Chief Complaint: Complains of cough, worried about bronchitis or pneumonia Denies SOB Moderate abdominal pain given coughing Working with IS   Objective: Vital signs in last 24 hours: Temp:  [97.8 F (36.6 C)-98.7 F (37.1 C)] 98.7 F (37.1 C) (07/23 0940) Pulse Rate:  [85-96] 96 (07/23 0940) Resp:  [10-19] 18 (07/23 0940) BP: (95-122)/(48-77) 122/52 (07/23 0940) SpO2:  [92 %-100 %] 92 % (07/23 0940) Last BM Date: 04/21/21  Intake/Output from previous day: 07/22 0701 - 07/23 0700 In: 1820 [P.O.:720; I.V.:1100] Out: 1125 [Urine:1100; Blood:25] Intake/Output this shift: Total I/O In: -  Out: 300 [Urine:300]  Exam: Awake and alert Resp no labored Abdomen soft, appropriately tender, binder in place  Lab Results:  Recent Labs    04/21/21 1254 04/23/21 0344  WBC 4.8 9.7  HGB 11.4* 10.0*  HCT 36.4 31.7*  PLT 269 245   BMET Recent Labs    04/21/21 1254 04/23/21 0344  NA 138 138  K 4.1 4.3  CL 102 106  CO2 28 26  GLUCOSE 83 136*  BUN 16 12  CREATININE 0.74 0.83  CALCIUM 9.3 8.7*   PT/INR No results for input(s): LABPROT, INR in the last 72 hours. ABG No results for input(s): PHART, HCO3 in the last 72 hours.  Invalid input(s): PCO2, PO2  Studies/Results: No results found.  Anti-infectives: Anti-infectives (From admission, onward)    Start     Dose/Rate Route Frequency Ordered Stop   04/22/21 0645  vancomycin (VANCOCIN) IVPB 1000 mg/200 mL premix        1,000 mg 200 mL/hr over 60 Minutes Intravenous On call to O.R. 04/22/21 0640 04/22/21 0856       Assessment/Plan: s/p Procedure(s): LAPAROSCOPIC INCISIONAL HERNIA REPAIR WITH MESH (N/A) LYSIS OF ADHESIONS (N/A)  Suspect some atelectasis given surgery Continue incentive spirometer and ambulation Pain control Need to keep today for pulmonary toilet/monitoring and pain control  LOS: 0 days    Coralie Keens 04/23/2021

## 2021-04-23 NOTE — Plan of Care (Signed)
  Problem: Health Behavior/Discharge Planning: Goal: Ability to manage health-related needs will improve Outcome: Progressing   Problem: Clinical Measurements: Goal: Will remain free from infection Outcome: Progressing   Problem: Nutrition: Goal: Adequate nutrition will be maintained Outcome: Progressing   Problem: Coping: Goal: Level of anxiety will decrease Outcome: Progressing

## 2021-04-23 NOTE — Progress Notes (Signed)
PT demonstrated hands on understanding of Flutter device- has strong, NP cough at this time.

## 2021-04-24 DIAGNOSIS — Z79899 Other long term (current) drug therapy: Secondary | ICD-10-CM | POA: Diagnosis not present

## 2021-04-24 DIAGNOSIS — Z886 Allergy status to analgesic agent status: Secondary | ICD-10-CM | POA: Diagnosis not present

## 2021-04-24 DIAGNOSIS — K430 Incisional hernia with obstruction, without gangrene: Secondary | ICD-10-CM | POA: Diagnosis present

## 2021-04-24 DIAGNOSIS — Z880 Allergy status to penicillin: Secondary | ICD-10-CM | POA: Diagnosis not present

## 2021-04-24 DIAGNOSIS — Z800 Family history of malignant neoplasm of digestive organs: Secondary | ICD-10-CM | POA: Diagnosis not present

## 2021-04-24 DIAGNOSIS — Z91041 Radiographic dye allergy status: Secondary | ICD-10-CM | POA: Diagnosis not present

## 2021-04-24 DIAGNOSIS — K660 Peritoneal adhesions (postprocedural) (postinfection): Secondary | ICD-10-CM | POA: Diagnosis present

## 2021-04-24 DIAGNOSIS — Z91040 Latex allergy status: Secondary | ICD-10-CM | POA: Diagnosis not present

## 2021-04-24 DIAGNOSIS — Z20822 Contact with and (suspected) exposure to covid-19: Secondary | ICD-10-CM | POA: Diagnosis present

## 2021-04-24 DIAGNOSIS — Z888 Allergy status to other drugs, medicaments and biological substances status: Secondary | ICD-10-CM | POA: Diagnosis not present

## 2021-04-24 DIAGNOSIS — Z9109 Other allergy status, other than to drugs and biological substances: Secondary | ICD-10-CM | POA: Diagnosis not present

## 2021-04-24 DIAGNOSIS — Z8249 Family history of ischemic heart disease and other diseases of the circulatory system: Secondary | ICD-10-CM | POA: Diagnosis not present

## 2021-04-24 DIAGNOSIS — I10 Essential (primary) hypertension: Secondary | ICD-10-CM | POA: Diagnosis present

## 2021-04-24 DIAGNOSIS — Z881 Allergy status to other antibiotic agents status: Secondary | ICD-10-CM | POA: Diagnosis not present

## 2021-04-24 NOTE — Progress Notes (Signed)
2 Days Post-Op   Subjective/Chief Complaint: Still with moderate pain and cough, but improving   Objective: Vital signs in last 24 hours: Temp:  [97.6 F (36.4 C)-99.2 F (37.3 C)] 98.5 F (36.9 C) (07/24 0731) Pulse Rate:  [78-96] 78 (07/24 0731) Resp:  [17-18] 18 (07/24 0731) BP: (96-122)/(46-60) 98/60 (07/24 0731) SpO2:  [92 %-98 %] 98 % (07/24 0731) Last BM Date: 04/21/21  Intake/Output from previous day: 07/23 0701 - 07/24 0700 In: 1854.8 [P.O.:521; I.V.:1333.8] Out: 2300 [Urine:2300] Intake/Output this shift: No intake/output data recorded.  Exam: More comfortable appearing today Abdomen soft, tender as expected post op.  Lab Results:  Recent Labs    04/21/21 1254 04/23/21 0344  WBC 4.8 9.7  HGB 11.4* 10.0*  HCT 36.4 31.7*  PLT 269 245   BMET Recent Labs    04/21/21 1254 04/23/21 0344  NA 138 138  K 4.1 4.3  CL 102 106  CO2 28 26  GLUCOSE 83 136*  BUN 16 12  CREATININE 0.74 0.83  CALCIUM 9.3 8.7*   PT/INR No results for input(s): LABPROT, INR in the last 72 hours. ABG No results for input(s): PHART, HCO3 in the last 72 hours.  Invalid input(s): PCO2, PO2  Studies/Results: No results found.  Anti-infectives: Anti-infectives (From admission, onward)    Start     Dose/Rate Route Frequency Ordered Stop   04/22/21 0645  vancomycin (VANCOCIN) IVPB 1000 mg/200 mL premix        1,000 mg 200 mL/hr over 60 Minutes Intravenous On call to O.R. 04/22/21 0640 04/22/21 0856       Assessment/Plan: s/p Procedure(s): LAPAROSCOPIC INCISIONAL HERNIA REPAIR WITH MESH (N/A) LYSIS OF ADHESIONS (N/A)  Advance diet Breathing treatments prn Pain control Home tomorrow  LOS: 0 days    Coralie Keens 04/24/2021

## 2021-04-24 NOTE — Progress Notes (Signed)
Pt calling out for nausea med, noted approx 25 cc's of clear saliva-looking material in emesis bag. Prn med given, instructed pt to remain on full liq diet until nausea passes. Pt verbalized understanding.

## 2021-04-25 ENCOUNTER — Encounter (HOSPITAL_COMMUNITY): Payer: Self-pay | Admitting: Surgery

## 2021-04-25 MED ORDER — HYDROCODONE-ACETAMINOPHEN 5-325 MG PO TABS: 1.0000 | ORAL_TABLET | Freq: Three times a day (TID) | ORAL | 0 refills | Status: DC | PRN

## 2021-04-25 NOTE — Discharge Summary (Signed)
Physician Discharge Summary  Patient ID: Patty Salas MRN: [id number] DOB/AGE: Jan 12, 1956 65 y.o.  Admit date: 04/22/2021 Discharge date: 04/25/2021  Admission Diagnoses: incarcerated incisional hernia  Discharge Diagnoses:  Active Problems:   S/P repair of ventral hernia   Discharged Condition: good  Hospital Course: She was admitted for observation and supportive care following laparoscopic lysis of adhesions with repair of incarcerated incisional hernia with mesh underlay.  On postop day 1, pain was well controlled, minimal nausea, and she was able to tolerate a liquid diet.  On postop day 2 she was able to mobilize more with improved pain control.  On postop day 3, the patient was tolerating a regular diet, mobilizing independently, and pain was well controlled with oral medications.  She was deemed stable for discharge home.  Discharge Exam: Blood pressure (!) 105/55, pulse 84, temperature 98.9 F (37.2 C), temperature source Oral, resp. rate 18, height 5' 5" (1.651 m), weight 69.2 kg, SpO2 94 %. General appearance: alert and cooperative Resp: Unlabored respiration Cardio: regular rate and rhythm GI: Soft, minimally appropriately tender, mildly distended.  Incisions clean dry intact with no cellulitis or hematoma, expected evolving ecchymosis around the central incisions. Extremities: No lower extremity edema. Pulses: 2+ and symmetric  Skin: Skin color, texture, turgor normal. No rashes or lesions Neurologic: Grossly normal  Disposition: Discharge disposition: 01-Home or Self Care       Allergies as of 04/25/2021       Reactions   Cymbalta [duloxetine Hcl] Anaphylaxis   Suicidal if in combination w/ Axert   Divalproex Sodium Anaphylaxis   Duract [bromfenac] Anaphylaxis   Hydrochlorothiazide Anaphylaxis   Pt loses facial movement uncontrolled;    Imitrex [sumatriptan Succinate] Anaphylaxis   Latex Anaphylaxis   Mellaril Anaphylaxis   Olanzapine Anaphylaxis    Penicillins Anaphylaxis   Topamax Anaphylaxis, Other (See Comments)   Confusion, tremor, blurred vision   Aripiprazole Other (See Comments)   Stiffened muscles   Aspirin Hives, Itching   [place] [flurazepam Hcl] Other (See Comments)   Stiffened all muscles   Darifenacin Hydrobromide Er Hives   Hives on back and looked like sunburn   Metoclopramide Hcl Other (See Comments)   headache   Seroquel [quetiapine Fumerate] Other (See Comments)   extreme fatigue and bad dreams   Statins Hives   Stelazine Other (See Comments)   Stiffened muscles   Thorazine [chlorpromazine Hcl] Other (See Comments)   Stiffened muscles   Abilify [aripiprazole]    Stiffened muscles, extrapyramidal effects all over body.    Alka-seltzer [aspirin Effervescent]    Mouth ulcers, swallowing problems.    Allopurinol Hives   Biofreeze [menthol (topical Analgesic)] Hives, Itching   Capzasin [capsaicin] Itching   Redness   Clindamycin/lincomycin    Bad reflux and loose stools   Clove Oil    Colchicine Hives   Cough Drops [benzocaine]    orthicoat sprays containing menthol or lemon flavor--breaks inside of mouth out, red rash, mouth ulcers.    Diflucan [fluconazole] Hives, Itching   Garlic    Mouth swelling   Iohexol     Code: HIVES, Desc: pt broke out in red rash and hives after CT injection on 12/07/09.-pt needs 13 hr prep kit, Onset Date: [id number]   Ivp Dye [iodinated Diagnostic Agents]    Lyrica [pregabalin]    Metamucil [psyllium]    Mouth ulcers, swallowing problems.   Miralax [polyethylene Glycol] Nausea And Vomiting   Myrbetriq [mirabegron]    swelling   Nsaids  Mouth ulcers, swelling of mouth.    Other Itching, Other (See Comments)   Dust, mold, feathers, wool, flannel, cologne, chlorox, household cleaning products, scented candles, cinnamon, ivory soap, paints, sun sentitive, acidic fruits, ( lemons, limes, strawbery, tartrazine yellow#5 and 6 in foods, MSG food additives, sudiium lauryl sulates,  hot peppers, spearmint, wintergreen peppermint, mentol, orange, jalapenos. ---headache, breathing, welps, canker sores inside mouth, uticaria   Oxybutynin Diarrhea   Potassium-containing Compounds    Undiluted through IV. --Pain.    Prednisone    No  Sleep at night.    Reglan [metoclopramide]    Headaches.    Renografin [diatrizoate]    welps    Risperdal [risperidone] Other (See Comments)   Too sedating.    Seroquel [quetiapine Fumarate]    Bad dreams, too sedating.   Simvastatin Other (See Comments)   Paranoid affect, muscle spasms.    Urocit - K [potassium Citrate]    Breaks mouth out   Zyprexa [olanzapine]    Unknown    Avelox [moxifloxacin Hcl In Nacl] Nausea And Vomiting   Barium-containing Compounds Rash   Diclofenac Rash   Doxycycline Nausea Only   Stomach upset.    E-mycin [erythromycin Base] Nausea Only   Fiorinal [butalbital-aspirin-caffeine] Itching, Rash   Welps,   Flagyl [metronidazole Hcl] Nausea And Vomiting   Lamictal [lamotrigine] Rash   Pregabalin    Blurry vision, muscle stiffness    Risperidone Other (See Comments)   Too sedating   Toradol [ketorolac Tromethamine] Itching, Rash        Medication List     TAKE these medications    acetaminophen 500 MG tablet Commonly known as: TYLENOL Take 500 mg by mouth every 6 (six) hours as needed for moderate pain.   albuterol 108 (90 Base) MCG/ACT inhaler Commonly known as: VENTOLIN HFA Inhale 2 puffs into the lungs every 6 (six) hours as needed for wheezing.   bisacodyl 5 MG EC tablet Commonly known as: DULCOLAX Take 10 mg by mouth daily as needed for mild constipation.   cyclobenzaprine 10 MG tablet Commonly known as: FLEXERIL Take 1 tablet (10 mg total) by mouth 3 (three) times daily.   docusate sodium 100 MG capsule Commonly known as: COLACE Take 100 mg by mouth daily as needed for mild constipation.   EPINEPHrine 0.3 mg/0.3 mL Soaj injection Commonly known as: EPI-PEN Inject 0.3 mg into  the muscle as needed for anaphylaxis.   fexofenadine 180 MG tablet Commonly known as: ALLEGRA Take 1 tablet (180 mg total) by mouth daily.   gabapentin 300 MG capsule Commonly known as: NEURONTIN Take 600 mg by mouth at bedtime.   HYDROcodone-acetaminophen 5-325 MG tablet Commonly known as: NORCO/VICODIN Take 1 tablet by mouth every 8 (eight) hours as needed for moderate pain. What changed: Another medication with the same name was added. Make sure you understand how and when to take each.   HYDROcodone-acetaminophen 5-325 MG tablet Commonly known as: NORCO/VICODIN Take 1 tablet by mouth every 8 (eight) hours as needed for moderate pain or severe pain. What changed: You were already taking a medication with the same name, and this prescription was added. Make sure you understand how and when to take each.   hyoscyamine 0.125 MG tablet Commonly known as: LEVSIN Take 0.125 mg by mouth every 4 (four) hours as needed for bladder spasms or cramping.   LORazepam 1 MG tablet Commonly known as: ATIVAN Take 1 mg by mouth 3 (three) times daily.   Lubricating Eye Drops  0.4-0.3 % Soln Generic drug: Polyethyl Glycol-Propyl Glycol Place 1 drop into both eyes 3 (three) times daily as needed (dry eyes).   promethazine 25 MG tablet Commonly known as: PHENERGAN TAKE 0.5 TABLETS (12.5 MG TOTAL) BY MOUTH EVERY 6 (SIX) HOURS AS NEEDED FOR NAUSEA OR VOMITING.   Trospium Chloride 60 MG Cp24 Take 1 capsule by mouth every morning.       ASK your doctor about these medications    almotriptan 12.5 MG tablet Commonly known as: AXERT TAKE 1 TABLET BY MOUTH AS NEEDED FOR MIGRAINE (MAX 2 TABS PER WEEK). THIS IS 90-DAY RX. PAYS CASH        Follow-up Information     Clovis Riley, MD Follow up in 3 week(s).   Specialty: General Surgery Why: If you already have an appointment scheduled, please ensure that this is a convenient time for you. Please arrive at least 15 minutes early to complete  any needed paperwork. Contact information: [address] [place] [address] [phone]                 Signed: Clovis Riley 04/25/2021, 8:09 AM

## 2021-04-25 NOTE — Progress Notes (Signed)
D/C instructions given to patient. Patient had no questions. NT or writer will wheel patient out once her ride is here.

## 2021-04-26 ENCOUNTER — Encounter: Payer: Medicare Other | Attending: Physical Medicine & Rehabilitation | Admitting: Registered Nurse

## 2021-04-26 ENCOUNTER — Other Ambulatory Visit: Payer: Self-pay

## 2021-04-26 ENCOUNTER — Encounter: Payer: Self-pay | Admitting: Registered Nurse

## 2021-04-26 VITALS — BP 125/70 | HR 100 | Ht 65.0 in | Wt 152.0 lb

## 2021-04-26 DIAGNOSIS — Z79891 Long term (current) use of opiate analgesic: Secondary | ICD-10-CM

## 2021-04-26 DIAGNOSIS — M5412 Radiculopathy, cervical region: Secondary | ICD-10-CM

## 2021-04-26 DIAGNOSIS — M542 Cervicalgia: Secondary | ICD-10-CM

## 2021-04-26 DIAGNOSIS — M7061 Trochanteric bursitis, right hip: Secondary | ICD-10-CM

## 2021-04-26 DIAGNOSIS — Z5181 Encounter for therapeutic drug level monitoring: Secondary | ICD-10-CM

## 2021-04-26 DIAGNOSIS — M5416 Radiculopathy, lumbar region: Secondary | ICD-10-CM | POA: Diagnosis not present

## 2021-04-26 DIAGNOSIS — G894 Chronic pain syndrome: Secondary | ICD-10-CM

## 2021-04-26 DIAGNOSIS — G8929 Other chronic pain: Secondary | ICD-10-CM

## 2021-04-26 DIAGNOSIS — M7062 Trochanteric bursitis, left hip: Secondary | ICD-10-CM

## 2021-04-26 DIAGNOSIS — M546 Pain in thoracic spine: Secondary | ICD-10-CM

## 2021-04-26 DIAGNOSIS — M797 Fibromyalgia: Secondary | ICD-10-CM

## 2021-04-26 DIAGNOSIS — M4716 Other spondylosis with myelopathy, lumbar region: Secondary | ICD-10-CM

## 2021-04-26 DIAGNOSIS — M47812 Spondylosis without myelopathy or radiculopathy, cervical region: Secondary | ICD-10-CM | POA: Diagnosis not present

## 2021-04-26 NOTE — Progress Notes (Signed)
Subjective:    Patient ID: Patty Salas, female    DOB: Sep 07, 1956, 65 y.o.   MRN: [id number]  HPI: Patty Salas is a 65 y.o. female whose appointment was changed to a My-Chart Video Visit. Patty Salas called the office reporting she was recently discharged from the hospital. She agrees with the My-Chart Video Visit and Verbalizes understanding.   She states her  pain is located in her neck radiating into her bilater shoulders, mid- lower back pain radiating into her bilateral hips and bilateral lower extremities. Also reports generalized joint pain and post- op pain in her abdomen, surgery following.  She rates her pain 9. Her current exercise regime is walking.   Patty Salas underwent on 04/22/2021 by Dr Kae Heller: She was admitted to [facility] on 04/22/2021 and discharged home on 04/25/2021 for S/P repair of ventral hernia.      Procedure Laterality Anesthesia  LAPAROSCOPIC INCISIONAL HERNIA REPAIR WITH MESH N/A General  LYSIS OF ADHESIONS        Patty Salas Morphine equivalent is 30.38  MME. She is also prescribed Lorazepam  by Dr. Reece Levy .We have discussed the black box warning of using opioids and benzodiazepines. I highlighted the dangers of using these drugs together and discussed the adverse events including respiratory suppression, overdose, cognitive impairment and importance of compliance with current regimen. We will continue to monitor and adjust as indicated.  she is being closely monitored and under the care of her psychiatrist.   Pain Inventory Average Pain 9 Pain Right Now 9 My pain is constant and aching  In the last 24 hours, has pain interfered with the following? General activity 8 Relation with others 8 Enjoyment of life 8 What TIME of day is your pain at its worst? morning , daytime, evening, and night Sleep (in general) Fair  Pain is worse with: some activites Pain improves with: rest, heat/ice, and medication Relief from Meds: 7  Family History  Problem  Relation Age of Onset   Cancer Mother        stomach   Migraines Mother    Arthritis Mother    Emphysema Mother    Heart disease Father    Hypertension Father    Cancer Father        colon   Dementia Father    Hypertension Brother    Migraines Brother    Hypertension Brother    Migraines Brother    Alcohol abuse Brother    Bipolar disorder Brother    Migraines Daughter    Arthritis Maternal Grandmother    Cancer Maternal Grandmother        stomach   Diabetes Maternal Grandmother    Stroke Maternal Grandmother    Cancer Maternal Aunt        lung   Emphysema Maternal Aunt    Cancer Paternal Uncle        colon   Hypertension Maternal Aunt    Heart disease Maternal Aunt    Cancer Paternal Uncle        lung   Social History   Socioeconomic History   Marital status: Married    Spouse name: Not on file   Number of children: 1   Years of education: COLLEGE1   Highest education level: Not on file  Occupational History   Occupation: HOUSEWIFE    Employer: UNEMPLOYED  Tobacco Use   Smoking status: Never   Smokeless tobacco: Never  Vaping Use   Vaping Use: Never used  Substance and Sexual Activity   Alcohol use: No   Drug use: No   Sexual activity: Not on file  Other Topics Concern   Not on file  Social History Narrative   Patient is right handed.   Patient drinks caffeine occasionally.   Social Determinants of Health   Financial Resource Strain: Not on file  Food Insecurity: Not on file  Transportation Needs: Not on file  Physical Activity: Not on file  Stress: Not on file  Social Connections: Not on file   Past Surgical History:  Procedure Laterality Date    kidney stones     ABDOMINAL ADHESION SURGERY     ABDOMINAL HYSTERECTOMY  1995   partial- hemmoraged after surgery-stitch came loose   APPENDECTOMY  1993   hemmoraged after surgery- stitch came loose   CERVICAL DISC SURGERY  06/04/2001   with fusion   [place]   colonoscopy      COLONOSCOPY     CYSTOSCOPY     FOOT SURGERY     lt   INCISIONAL HERNIA REPAIR N/A 04/22/2021   Procedure: LAPAROSCOPIC INCISIONAL HERNIA REPAIR WITH MESH;  Surgeon: Clovis Riley, MD;  Location: [facility];  Service: General;  Laterality: N/A;   jj stent  07/2002   with ureteroscopy, cystoscopy and then removal of stent in office   LAPAROSCOPIC LYSIS OF ADHESIONS N/A 04/22/2021   Procedure: LYSIS OF ADHESIONS;  Surgeon: Clovis Riley, MD;  Location: [facility];  Service: General;  Laterality: N/A;   LITHOTRIPSY     LYMPH NODE BIOPSY Right 03/06/2014   Procedure: right groin LYMPH NODE BIOPSY;  Surgeon: Merrie Roof, MD;  Location: [place];  Service: General;  Laterality: Right;   LYMPHADENECTOMY N/A 12/29/2015   Procedure: RETROPERITONEAL LYMPHADENECTOMY;  Surgeon: Alexis Frock, MD;  Location: [facility];  Service: Urology;  Laterality: N/A;   NECK SURGERY  2002   ROBOT ASSISTED LAPAROSCOPIC NEPHRECTOMY Left 12/29/2015   Procedure: XI ROBOTIC ASSISTED LEFT LAPAROSCOPIC NEPHRECTOMY;  Surgeon: Alexis Frock, MD;  Location: [facility];  Service: Urology;  Laterality: Left;   TONSILLECTOMY  1976   Past Surgical History:  Procedure Laterality Date    kidney stones     ABDOMINAL ADHESION SURGERY     ABDOMINAL HYSTERECTOMY  1995   partial- hemmoraged after surgery-stitch came loose   APPENDECTOMY  1993   hemmoraged after surgery- stitch came loose   CERVICAL DISC SURGERY  06/04/2001   with fusion   [place]   colonoscopy     COLONOSCOPY     CYSTOSCOPY     FOOT SURGERY     lt   INCISIONAL HERNIA REPAIR N/A 04/22/2021   Procedure: LAPAROSCOPIC INCISIONAL HERNIA REPAIR WITH MESH;  Surgeon: Clovis Riley, MD;  Location: [facility];  Service: General;  Laterality: N/A;   jj stent  07/2002   with ureteroscopy, cystoscopy and then removal of stent in office   LAPAROSCOPIC LYSIS OF ADHESIONS N/A 04/22/2021   Procedure: LYSIS OF ADHESIONS;  Surgeon: Clovis Riley,  MD;  Location: [facility];  Service: General;  Laterality: N/A;   LITHOTRIPSY     LYMPH NODE BIOPSY Right 03/06/2014   Procedure: right groin LYMPH NODE BIOPSY;  Surgeon: Merrie Roof, MD;  Location: [place];  Service: General;  Laterality: Right;   LYMPHADENECTOMY N/A 12/29/2015   Procedure: RETROPERITONEAL LYMPHADENECTOMY;  Surgeon: Alexis Frock, MD;  Location[facility];  Service: Urology;  Laterality: N/A;   NECK SURGERY  2002   ROBOT ASSISTED LAPAROSCOPIC NEPHRECTOMY Left 12/29/2015   Procedure: XI ROBOTIC ASSISTED LEFT LAPAROSCOPIC NEPHRECTOMY;  Surgeon: Alexis Frock, MD;  Location: [facility];  Service: Urology;  Laterality: Left;   TONSILLECTOMY  1976   Past Medical History:  Diagnosis Date   Anxiety    Arthritis    Asthma    Bipolar 2 disorder ([place])    pt stated, "I have Bipolar 2 and it is remission"   Bipolar affective ([place])    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  Opioid Risk Score:   Fall Risk Score:  `1  Depression screen PHQ 2/9  Depression screen [facility] 2/9 03/01/2021 10/05/2020 04/09/2020 02/04/2020 10/06/2019 04/30/2019 03/06/2019  Decreased Interest 0 '1 1 1 1 1 1   '$ Down, Depressed, Hopeless 0 - '1 1 1 1 1  '$ PHQ - 2 Score 0 '1 2 2 2 2 2  '$ Altered sleeping - - - - - - -  Tired, decreased energy - - - - - - -  Change in appetite - - - - - - -  Feeling bad or failure about yourself  - - - - - - -  Trouble concentrating - - - - - - -  Moving slowly or fidgety/restless - - - - - - -  Suicidal thoughts - - - - - - -  PHQ-9 Score - - - - - - -  Difficult doing work/chores - - - - - - -  Some recent data might be hidden      Review of Systems  Constitutional:  Positive for appetite change.  HENT: Negative.    Eyes: Negative.   Respiratory: Negative.    Cardiovascular: Negative.   Gastrointestinal: Negative.   Endocrine: Negative.   Genitourinary: Negative.   Musculoskeletal:  Positive for back pain and gait problem.       PAIN IN HEAD  Skin: Negative.   Allergic/Immunologic: Negative.   Hematological: Negative.   Psychiatric/Behavioral: Negative.        Objective:   Physical Exam Vitals and nursing note reviewed.  Musculoskeletal:     Comments: No Physical Exam Perform: My-Chart Video Visit         Assessment & Plan:  1. History of fibromyalgia with myofascial pain and multiple trigger points.Continue with Heat and exercise Regime. Continue with current medication regimen with  gabapentin. 04/26/2021 2. Chronic migraine headaches.Continue to Monitor. S/P Botox.on 05/30/2017. 04/26/2021 3. Midline Low  Back Pain/ Lumbar Spondylosis/Lumbar degenerative disk disease, L4-5/ Lumbar Radiculitis: Continue with HEP and current medication regimen.  Refilled:  Hydrocodone 5/325 mg one tablet every 8 hours as needed #80. Marland KitchenContinue with slow weaning of Hydrocodone. 04/26/2021 4. 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 Urology Following. 04/26/2021. 5. 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 04/26/2021 6. Cervicalgia/ Cervical RadiculitisCervical Spondylosis: Continue current medication regiment with  Gabapentin.  Continue  with HEP and Continue to monitor. 04/26/2021 7. Muscle Spasm: Continue current medication regimen with Flexeril as needed. 04/26/2021 9. Bilateral Greater Trochanter Bursitis: Continue to alternate with ice and heat therapy. Continue HEP as Tolerated. Continue to monitor. 04/26/2021 10. Bilateral Knee Pain: No complaints today. S/P Knee Injection by Dr Sharol Given on 02/23/2020. Orthopedics Following. Continue to Monitor. 04/26/2021   F/U in 1 month My-Chart Video Visit Established Patient Location of Patient: In her Home Location of Provider: In the Office

## 2021-04-27 ENCOUNTER — Telehealth: Payer: Self-pay

## 2021-04-27 DIAGNOSIS — G894 Chronic pain syndrome: Secondary | ICD-10-CM

## 2021-04-27 DIAGNOSIS — M797 Fibromyalgia: Secondary | ICD-10-CM

## 2021-04-27 MED ORDER — HYDROCODONE-ACETAMINOPHEN 5-325 MG PO TABS: 1.0000 | ORAL_TABLET | Freq: Three times a day (TID) | ORAL | 0 refills | Status: DC | PRN

## 2021-04-27 NOTE — Telephone Encounter (Signed)
Incarcerated incisional hernia surgery on  04/20/2021.   Pain is 9.5 /10 without pain medicine. Using heating pain.  Pain at incisional areas. Bowel movements are soft.   Dr. Kae Heller has advised her to call you for pain medication management.   Patient wanted to know: Will it be okay take 2 Norco's  tonight night. Then take 2 in AM tomorrow. Then one Norco  with tylenol every 8 hours.   Also when will her refill be sent to the pharmacy? She has 22.5 tabs of Narco on hand.   Call back ph [phone]

## 2021-04-27 NOTE — Telephone Encounter (Signed)
RF'ed hydrocodone for 05/02/21

## 2021-05-18 ENCOUNTER — Telehealth: Payer: Self-pay | Admitting: Cardiology

## 2021-05-18 NOTE — Telephone Encounter (Signed)
Left message for patient to call back  

## 2021-05-18 NOTE — Telephone Encounter (Signed)
  took husbands fluid pill to assist with weight gain from swelling and states it helped tremendously.Marland Kitchen would like to be placed back on Furosemide if possible... please advise

## 2021-05-19 ENCOUNTER — Telehealth: Payer: Self-pay | Admitting: Cardiology

## 2021-05-19 NOTE — Telephone Encounter (Signed)
Patient discussed for >15 min that she had an abd surgery at the end of July, hernia repair, lysis of adhesions and was on a liquid diet for several days after.  She felt very swollen in her feet, ankles, legs and her weight was 167.0.  she was not SOB.  She took her husband's lasix 40 mg x 2 doses and her weight dropped to 147.3.  since then it has increased slightly and she is beginning to notice swelling again.  Today her weight is 150.0.  BP yesterday 124/81, HR 95.   I adv that she may have been a little bit overloaded from having surgery and being less mobile and that now she should be seeing things stabilize.  She had nl ef on echo in January.  Adv to limit sodium and elevate legs when sitting and she has been doing these things already.  She had her post op appointment and they told her to follow up with cardiology.   I will ask scheduling to try to get her an appointment to follow up with Dr. Radford Pax.  Will route to Dr. Radford Pax as Juluis Rainier and if there are any new recommendations we will call her back.

## 2021-05-19 NOTE — Telephone Encounter (Signed)
Patient was returning a call from [place]. She would just line to speak to any Nurse today

## 2021-05-25 NOTE — Telephone Encounter (Signed)
Scheduled patient with DOD on Friday 8/26

## 2021-05-25 NOTE — Progress Notes (Signed)
Cardiology Consult Note    Date:  05/27/2021   ID:  Patty Salas, DOB Jun 01, 1956, MRN [id number]  PCP:  Lorrene Reid, PA-C  Cardiologist:  Timmie Foerster chief complaint on file.   History of Present Illness:  Patty Salas is a 65 y.o. 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 previous left nephrectomy NM PET 03/23/21 mild disease progression in chest and minimal decrease in nodal size in abdomen   TTE 10/07/20 EF 60-65% normal RV only diastolic relaxation abnormality no significant valve disease or signs  Of elevated PA pressure  She has a unique personality and we spent a lot of time talking about her pain management And holistic approach to care She has more allergies on her med list than I have ever seen before She is wearing a binder post hernia surgery     Past Medical History:  Diagnosis Date   Anxiety    Arthritis    Asthma    Bipolar 2 disorder ([place])    pt stated, "I have Bipolar 2 and it is remission"   Bipolar affective ([place])    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/22/2016   Full dentures    Gout    Headache(784.0)    migraines   Herpesviral infection    Hypertension    IBS (irritable bowel syndrome)    with diarrhea   Lymphadenopathy    Myalgia and myositis, unspecified    PAT (paroxysmal atrial tachycardia) (HCC)    asymptomatic 10 beat run on event monitor 09/2020   Pneumonia    PONV (postoperative nausea and  vomiting)    PTSD (post-traumatic stress disorder)    Renal calculi    Restless legs syndrome (RLS)    Thoracic radiculopathy    Vitamin D deficiency    Wears glasses     Past Surgical History:  Procedure Laterality Date    kidney stones     ABDOMINAL ADHESION SURGERY     ABDOMINAL HYSTERECTOMY  1995   partial- hemmoraged after surgery-stitch came loose   APPENDECTOMY  1993   hemmoraged after surgery- stitch came loose   CERVICAL [place] SURGERY  06/04/2001   with fusion   [place]   colonoscopy     COLONOSCOPY     CYSTOSCOPY     FOOT SURGERY     lt   INCISIONAL HERNIA REPAIR N/A 04/22/2021   Procedure: LAPAROSCOPIC INCISIONAL HERNIA REPAIR WITH MESH;  Surgeon: Clovis Riley, MD;  Location: [facility];  Service: General;  Laterality: N/A;   jj stent  07/2002   with ureteroscopy, cystoscopy and then removal of stent in office   LAPAROSCOPIC LYSIS OF ADHESIONS N/A 04/22/2021   Procedure: LYSIS OF ADHESIONS;  Surgeon: Clovis Riley, MD;  Location: [facility];  Service: General;  Laterality: N/A;   LITHOTRIPSY     LYMPH NODE BIOPSY Right  03/06/2014   Procedure: right groin LYMPH NODE BIOPSY;  Surgeon: Merrie Roof, MD;  Location: [facility];  Service: General;  Laterality: Right;   LYMPHADENECTOMY N/A 12/29/2015   Procedure: RETROPERITONEAL LYMPHADENECTOMY;  Surgeon: Alexis Frock, MD;  Location: [facility];  Service: Urology;  Laterality: N/A;   NECK SURGERY  2002   ROBOT ASSISTED LAPAROSCOPIC NEPHRECTOMY Left 12/29/2015   Procedure: XI ROBOTIC ASSISTED LEFT LAPAROSCOPIC NEPHRECTOMY;  Surgeon: Alexis Frock, MD;  Location: [facility];  Service: Urology;  Laterality: Left;   TONSILLECTOMY  1976    Current Medications: Current Meds  Medication Sig   acetaminophen (TYLENOL) 500 MG tablet Take 500 mg by mouth every 6 (six) hours as needed for moderate pain.   albuterol (VENTOLIN HFA) 108 (90 Base) MCG/ACT inhaler Inhale 2 puffs into the lungs every 6 (six) hours  as needed for wheezing.   almotriptan (AXERT) 12.5 MG tablet TAKE 1 TABLET BY MOUTH AS NEEDED FOR MIGRAINE (MAX 2 TABS PER WEEK). THIS IS 90-DAY RX. PAYS CASH (Patient taking differently: Take 12.5 mg by mouth as needed for migraine. TAKE 1 TABLET BY MOUTH AS NEEDED FOR MIGRAINE (MAX 2 TABS PER WEEK). THIS IS 90-DAY RX. PAYS CASH)   bisacodyl (DULCOLAX) 5 MG EC tablet Take 10 mg by mouth daily as needed for mild constipation.    cyclobenzaprine (FLEXERIL) 10 MG tablet Take 1 tablet (10 mg total) by mouth 3 (three) times daily.   docusate sodium (COLACE) 100 MG capsule Take 100 mg by mouth daily as needed for mild constipation.    EPINEPHrine 0.3 mg/0.3 mL IJ SOAJ injection Inject 0.3 mg into the muscle as needed for anaphylaxis.   fexofenadine (ALLEGRA) 180 MG tablet Take 1 tablet (180 mg total) by mouth daily.   furosemide (LASIX) 20 MG tablet Take 1 tablet (20 mg total) by mouth as needed for edema.   gabapentin (NEURONTIN) 300 MG capsule Take 600 mg by mouth at bedtime.   HYDROcodone-acetaminophen (NORCO/VICODIN) 5-325 MG tablet Take 1 tablet by mouth every 8 (eight) hours as needed for moderate pain.   hyoscyamine (LEVSIN, ANASPAZ) 0.125 MG tablet Take 0.125 mg by mouth every 4 (four) hours as needed for bladder spasms or cramping.    LORazepam (ATIVAN) 1 MG tablet Take 1 mg by mouth 3 (three) times daily.   Polyethyl Glycol-Propyl Glycol (LUBRICATING EYE DROPS) 0.4-0.3 % SOLN Place 1 drop into both eyes 3 (three) times daily as needed (dry eyes).   promethazine (PHENERGAN) 25 MG tablet TAKE 0.5 TABLETS (12.5 MG TOTAL) BY MOUTH EVERY 6 (SIX) HOURS AS NEEDED FOR NAUSEA OR VOMITING.   Trospium Chloride 60 MG CP24 Take 1 capsule by mouth every morning.     Allergies:   Cymbalta [duloxetine hcl], Divalproex sodium, Duract [bromfenac], Hydrochlorothiazide, Imitrex [sumatriptan succinate], Latex, Mellaril, Olanzapine, Penicillins, Topamax, Aripiprazole, Aspirin, Dalmane [flurazepam hcl], Darifenacin  hydrobromide er, Metoclopramide hcl, Seroquel [quetiapine fumerate], Statins, Stelazine, Thorazine [chlorpromazine hcl], Abilify [aripiprazole], Alka-seltzer [aspirin effervescent], Allopurinol, Biofreeze [menthol (topical analgesic)], Capzasin [capsaicin], Clindamycin/lincomycin, Clove oil, Colchicine, Cough drops [benzocaine], Diflucan [fluconazole], Garlic, Iohexol, Ivp dye [iodinated diagnostic agents], Lyrica [pregabalin], Metamucil [psyllium], Miralax [polyethylene glycol], Myrbetriq [mirabegron], Nsaids, Other, Oxybutynin, Potassium-containing compounds, Prednisone, Reglan [metoclopramide], Renografin [diatrizoate], Risperdal [risperidone], Seroquel [quetiapine fumarate], Simvastatin, Urocit - k [potassium citrate], Zyprexa [olanzapine], Avelox [moxifloxacin hcl in nacl], Barium-containing compounds, Diclofenac, Doxycycline, E-mycin [erythromycin base], Fiorinal [butalbital-aspirin-caffeine], Flagyl [metronidazole hcl], Lamictal [lamotrigine], Pregabalin, Risperidone, and Toradol [ketorolac tromethamine]   Social History   Socioeconomic History  Marital status: Married    Spouse name: Not on file   Number of children: 1   Years of education: COLLEGE1   Highest education level: Not on file  Occupational History   Occupation: HOUSEWIFE    Employer: UNEMPLOYED  Tobacco Use   Smoking status: Never   Smokeless tobacco: Never  Vaping Use   Vaping Use: Never used  Substance and Sexual Activity   Alcohol use: No   Drug use: No   Sexual activity: Not on file  Other Topics Concern   Not on file  Social History Narrative   Patient is right handed.   Patient drinks caffeine occasionally.   Social Determinants of Health   Financial Resource Strain: Not on file  Food Insecurity: Not on file  Transportation Needs: Not on file  Physical Activity: Not on file  Stress: Not on file  Social Connections: Not on file     Family History:  The patient's family history includes Alcohol abuse in  her brother; Arthritis in her maternal grandmother and mother; Bipolar disorder in her brother; Cancer in her father, maternal aunt, maternal grandmother, mother, paternal uncle, and paternal uncle; Dementia in her father; Diabetes in her maternal grandmother; Emphysema in her maternal aunt and mother; Heart disease in her father and maternal aunt; Hypertension in her brother, brother, father, and maternal aunt; Migraines in her brother, brother, daughter, and mother; Stroke in her maternal grandmother.   ROS:   Please see the history of present illness.    ROS All other systems reviewed and are negative.  No flowsheet data found.  PHYSICAL EXAM:   VS:  BP (!) 142/88   Pulse (!) 104   Ht '5\' 5"'$  (1.651 m)   Wt 70.3 kg   SpO2 98%   BMI 25.79 kg/m    Affect appropriate Healthy:  appears stated age 25: normal Neck supple with no adenopathy JVP normal no bruits no thyromegaly Lungs clear with no wheezing and good diaphragmatic motion Heart:  S1/S2 no murmur, no rub, gallop or click PMI normal Abdomen: benighn, post hernia repair and left nephrectomy  Distal pulses intact with no bruits No edema varicosities bilateral  Neuro non-focal Skin warm and dry No muscular weakness   Wt Readings from Last 3 Encounters:  05/27/21 70.3 kg  04/26/21 68.9 kg  04/22/21 69.2 kg      Studies/Labs Reviewed:   EKG:  EKG is ordered today.  The ekg ordered today demonstrates Sinus tachycardia at 105bpm with no ST changes  Recent Labs: 03/07/2021: ALT 30 04/23/2021: BUN 12; Creatinine, Ser 0.83; Hemoglobin 10.0; Magnesium 1.7; Platelets 245; Potassium 4.3; Sodium 138   Lipid Panel    Component Value Date/Time   CHOL 221 (H) 03/24/2020 1000   TRIG 156 (H) 03/24/2020 1000   HDL 68 03/24/2020 1000   CHOLHDL 3.3 03/24/2020 1000   LDLCALC 126 (H) 03/24/2020 1000      Additional studies/ records that were reviewed today include:  EKG    ASSESSMENT:    No diagnosis found.    PLAN:   In order of problems listed above:  HTN -BP controlled on exam -diet controlled  2.  LE edema -related to recent abdominal surgery and pelvic lymphoma This has nothing to do with her heart -  f/u primary /oncology  - Discussed using binder less  - PRN lasix called in   3.  Sinus tachycardia -HR was mildly elevated on visit 12/21 and 104 today deconditioned  - monitor reviewed  09/10/20 average HR 89 bpm no significant arrhythmias    F/U Dr Radford Pax in 6 months    Signed, Jenkins Rouge, MD  05/27/2021 9:42 AM    [address], [place], [place]  [address] Phone: [phone]; Fax: [phone]

## 2021-05-27 ENCOUNTER — Other Ambulatory Visit: Payer: Self-pay

## 2021-05-27 ENCOUNTER — Ambulatory Visit (INDEPENDENT_AMBULATORY_CARE_PROVIDER_SITE_OTHER): Payer: Medicare Other | Admitting: Cardiovascular Disease

## 2021-05-27 ENCOUNTER — Encounter: Payer: Self-pay | Admitting: Cardiovascular Disease

## 2021-05-27 VITALS — BP 142/88 | HR 104 | Ht 65.0 in | Wt 155.0 lb

## 2021-05-27 DIAGNOSIS — R609 Edema, unspecified: Secondary | ICD-10-CM | POA: Diagnosis not present

## 2021-05-27 DIAGNOSIS — Z9889 Other specified postprocedural states: Secondary | ICD-10-CM

## 2021-05-27 DIAGNOSIS — G8929 Other chronic pain: Secondary | ICD-10-CM

## 2021-05-27 DIAGNOSIS — I10 Essential (primary) hypertension: Secondary | ICD-10-CM

## 2021-05-27 DIAGNOSIS — Z8719 Personal history of other diseases of the digestive system: Secondary | ICD-10-CM

## 2021-05-27 MED ORDER — FUROSEMIDE 20 MG PO TABS: 20.0000 mg | ORAL_TABLET | ORAL | 0 refills | Status: DC | PRN

## 2021-05-27 NOTE — Patient Instructions (Addendum)
Medication Instructions:  Your physician has recommended you make the following change in your medication:  1-START Lasix 20 mg by mouth as needed for edema.  *If you need a refill on your cardiac medications before your next appointment, please call your pharmacy*  Lab Work: If you have labs (blood work) drawn today and your tests are completely normal, you will receive your results only by: [place] (if you have MyChart) OR A paper copy in the mail If you have any lab test that is abnormal or we need to change your treatment, we will call you to review the results.  Testing/Procedures: None ordered today.  Follow-Up: At [facility], you and your health needs are our priority.  As part of our continuing mission to provide you with exceptional heart care, we have created designated Provider Care Teams.  These Care Teams include your primary Cardiologist (physician) and Advanced Practice Providers (APPs -  Physician Assistants and Nurse Practitioners) who all work together to provide you with the care you need, when you need it.  We recommend signing up for the patient portal called "MyChart".  Sign up information is provided on this After Visit Summary.  MyChart is used to connect with patients for Virtual Visits (Telemedicine).  Patients are able to view lab/test results, encounter notes, upcoming appointments, etc.  Non-urgent messages can be sent to your provider as well.   To learn more about what you can do with MyChart, go to [email or web address].    Your next appointment:   6 month(s)  The format for your next appointment:   In Person  Provider:   You may see Dr. Radford Pax or one of the following Advanced Practice Providers on your designated Care Team:   Melina Copa, PA-C Ermalinda Barrios, PA-C

## 2021-05-31 ENCOUNTER — Telehealth: Payer: Self-pay | Admitting: Registered Nurse

## 2021-05-31 NOTE — Telephone Encounter (Signed)
Patient had to cancel appointment on 06/01/21 with Dr. Naaman Plummer due to fever. She needs a refill on Norco. When would you like her to come in for a visit?

## 2021-06-01 ENCOUNTER — Telehealth: Payer: Self-pay | Admitting: Registered Nurse

## 2021-06-01 ENCOUNTER — Encounter: Payer: Medicare Other | Admitting: Physical Medicine & Rehabilitation

## 2021-06-01 DIAGNOSIS — G894 Chronic pain syndrome: Secondary | ICD-10-CM

## 2021-06-01 DIAGNOSIS — M797 Fibromyalgia: Secondary | ICD-10-CM

## 2021-06-01 MED ORDER — CYCLOBENZAPRINE HCL 10 MG PO TABS: 10.0000 mg | ORAL_TABLET | Freq: Three times a day (TID) | ORAL | 4 refills | Status: DC

## 2021-06-01 MED ORDER — HYDROCODONE-ACETAMINOPHEN 5-325 MG PO TABS: 1.0000 | ORAL_TABLET | Freq: Three times a day (TID) | ORAL | 0 refills | Status: DC | PRN

## 2021-06-01 NOTE — Telephone Encounter (Signed)
PMP was Reviewed: Hydrocodone and Flexeril e-scribed today.

## 2021-06-01 NOTE — Telephone Encounter (Signed)
Patty Salas,  Patty Salas needs to come to th office for a visit, she hasn't been in the office since March, 2022. Her last few visits have been a My-Chart visit. Can you please call her and ask her to schedule an appointment with me. [name] . She needs to let me know how many Norco's she has.  Thanks.

## 2021-06-04 ENCOUNTER — Other Ambulatory Visit: Payer: Self-pay | Admitting: Neurology

## 2021-06-07 ENCOUNTER — Other Ambulatory Visit: Payer: Self-pay | Admitting: Neurology

## 2021-06-09 NOTE — Telephone Encounter (Signed)
error 

## 2021-06-13 ENCOUNTER — Emergency Department (HOSPITAL_COMMUNITY): Payer: Medicare Other

## 2021-06-13 ENCOUNTER — Other Ambulatory Visit: Payer: Self-pay

## 2021-06-13 ENCOUNTER — Encounter: Payer: Medicare Other | Attending: Physical Medicine & Rehabilitation | Admitting: Registered Nurse

## 2021-06-13 ENCOUNTER — Emergency Department (HOSPITAL_COMMUNITY)
Admission: EM | Admit: 2021-06-13 | Discharge: 2021-06-14 | Disposition: A | Discharge status: Home / Self Care | Payer: Medicare Other | Attending: Emergency Medicine | Admitting: Emergency Medicine

## 2021-06-13 ENCOUNTER — Encounter: Payer: Self-pay | Admitting: Registered Nurse

## 2021-06-13 VITALS — BP 143/81 | HR 105 | Temp 99.3°F | Ht 65.0 in | Wt 158.0 lb

## 2021-06-13 DIAGNOSIS — Z91040 Latex allergy status: Secondary | ICD-10-CM | POA: Insufficient documentation

## 2021-06-13 DIAGNOSIS — M797 Fibromyalgia: Secondary | ICD-10-CM | POA: Insufficient documentation

## 2021-06-13 DIAGNOSIS — M7062 Trochanteric bursitis, left hip: Secondary | ICD-10-CM | POA: Insufficient documentation

## 2021-06-13 DIAGNOSIS — R0789 Other chest pain: Secondary | ICD-10-CM

## 2021-06-13 DIAGNOSIS — Z5181 Encounter for therapeutic drug level monitoring: Secondary | ICD-10-CM | POA: Diagnosis not present

## 2021-06-13 DIAGNOSIS — M5416 Radiculopathy, lumbar region: Secondary | ICD-10-CM

## 2021-06-13 DIAGNOSIS — R0602 Shortness of breath: Secondary | ICD-10-CM | POA: Diagnosis not present

## 2021-06-13 DIAGNOSIS — G8929 Other chronic pain: Secondary | ICD-10-CM

## 2021-06-13 DIAGNOSIS — M47812 Spondylosis without myelopathy or radiculopathy, cervical region: Secondary | ICD-10-CM

## 2021-06-13 DIAGNOSIS — M5412 Radiculopathy, cervical region: Secondary | ICD-10-CM

## 2021-06-13 DIAGNOSIS — R911 Solitary pulmonary nodule: Secondary | ICD-10-CM | POA: Insufficient documentation

## 2021-06-13 DIAGNOSIS — G894 Chronic pain syndrome: Secondary | ICD-10-CM | POA: Diagnosis not present

## 2021-06-13 DIAGNOSIS — Z79891 Long term (current) use of opiate analgesic: Secondary | ICD-10-CM

## 2021-06-13 DIAGNOSIS — Z85528 Personal history of other malignant neoplasm of kidney: Secondary | ICD-10-CM | POA: Diagnosis not present

## 2021-06-13 DIAGNOSIS — I10 Essential (primary) hypertension: Secondary | ICD-10-CM | POA: Diagnosis not present

## 2021-06-13 DIAGNOSIS — J45909 Unspecified asthma, uncomplicated: Secondary | ICD-10-CM | POA: Insufficient documentation

## 2021-06-13 DIAGNOSIS — M7061 Trochanteric bursitis, right hip: Secondary | ICD-10-CM

## 2021-06-13 DIAGNOSIS — M546 Pain in thoracic spine: Secondary | ICD-10-CM | POA: Insufficient documentation

## 2021-06-13 DIAGNOSIS — M542 Cervicalgia: Secondary | ICD-10-CM

## 2021-06-13 LAB — COMPREHENSIVE METABOLIC PANEL
ALT: 16 U/L (ref 0–44)
AST: 25 U/L (ref 15–41)
Albumin: 3.8 g/dL (ref 3.5–5.0)
Alkaline Phosphatase: 77 U/L (ref 38–126)
Anion gap: 8 (ref 5–15)
BUN: 16 mg/dL (ref 8–23)
CO2: 30 mmol/L (ref 22–32)
Calcium: 9.5 mg/dL (ref 8.9–10.3)
Chloride: 99 mmol/L (ref 98–111)
Creatinine, Ser: 0.87 mg/dL (ref 0.44–1.00)
GFR, Estimated: >60 mL/min (ref >60)
Glucose, Bld: 93 mg/dL (ref 70–99)
Potassium: 3.8 mmol/L (ref 3.5–5.1)
Sodium: 137 mmol/L (ref 135–145)
Total Bilirubin: 0.7 mg/dL (ref 0.3–1.2)
Total Protein: 7.7 g/dL (ref 6.5–8.1)

## 2021-06-13 LAB — CBC
HCT: 37.8 % (ref 36.0–46.0)
Hemoglobin: 11.7 g/dL — ABNORMAL LOW (ref 12.0–15.0)
MCH: 30.5 pg (ref 26.0–34.0)
MCHC: 31 g/dL (ref 30.0–36.0)
MCV: 98.7 fL (ref 80.0–100.0)
Platelets: 295 10*3/uL (ref 150–400)
RBC: 3.83 MIL/uL — ABNORMAL LOW (ref 3.87–5.11)
RDW: 12.7 % (ref 11.5–15.5)
WBC: 6.7 10*3/uL (ref 4.0–10.5)
nRBC: 0 % (ref 0.0–0.2)

## 2021-06-13 LAB — TROPONIN I (HIGH SENSITIVITY)
Troponin I (High Sensitivity): 6 ng/L (ref <18)
Troponin I (High Sensitivity): 7 ng/L (ref <18)

## 2021-06-13 NOTE — ED Provider Notes (Signed)
Emergency Medicine Provider Triage Evaluation Note  Patty Salas , a 65 y.o. female  was evaluated in triage.   Patient presents with chest pain that began last night when she was getting ready for bed.  It is localized to her left chest and radiates back to her left shoulder.  She says it feels sharp.  She has tried Norco and Tylenol with no relief of symptoms.  Pain is not pleuritic.  She does have some stiffness in her left shoulder.  She did have some associated numbness and tingling down her left arm.  She also has associated nausea.  She denies any shortness of breath, syncope, abdominal pain, vomiting.  She has a history of heart problems and takes Lasix at home. Review of Systems  Positive: Chest pain, nausea, numbness and tingling in right arm, shoulder pain Negative: Shortness of breath, vomiting, abdominal pain, syncope  Physical Exam  BP 115/70   Pulse 100   Temp 98.7 F (37.1 C) (Oral)   Resp (!) 22   SpO2 97%  Gen:   Awake, no distress   Resp:  Normal effort.  Lung sounds clear to auscultation. MSK:   Moves extremities without difficulty.  Chest pain reproducible by maneuvering left shoulder.  Posterior left shoulder tenderness to palpation.  Anterior Pectoris muscle tenderness to palpation Other:  Pulses are equal in all extremities.  No lower extremity edema.  Medical Decision Making  Medically screening exam initiated at 1:00 PM.  Appropriate orders placed.  Patty Salas was informed that the remainder of the evaluation will be completed by another provider, this initial triage assessment does not replace that evaluation, and the importance of remaining in the ED until their evaluation is complete.    Adolphus Birchwood, PA-C 06/13/21 [place], DO 06/13/21 1813

## 2021-06-13 NOTE — Progress Notes (Signed)
Subjective:    Patient ID: Patty Salas, female    DOB: [birth year], 65 y.o.   MRN: [id number]  HPI: Patty Salas is a 65 y.o. female who returns for follow up appointment for chronic pain and medication refill. Patty Salas states she is having chest pain radiating into her left arm with SOB, she also states she has right Jaw pain. Patty Salas reports her chest pain began last night, she didn't seek medical attention, she states she brought a overnight bag with her, in case she would be admitted to the hospital. EMS called, Patty Salas agrees with EMS call and being transferred to [facility] ED for evaluation.    She also reports she has her usual pain which is located in her mid- lower back radiating into her bilateral lower extremities. She rates her pain 9. Her current exercise regime is walking.  Patty Salas Morphine equivalent is 15.38 MME.   UDS ordered today.     Pain Inventory Average Pain 9 Pain Right Now 9 My pain is constant, sharp, burning, stabbing, tingling, and aching  In the last 24 hours, has pain interfered with the following? General activity 9 Relation with others 8 Enjoyment of life 9 What TIME of day is your pain at its worst? morning , daytime, evening, and night Sleep (in general) Fair  Pain is worse with: walking, bending, sitting, inactivity, standing, and some activites Pain improves with: rest, heat/ice, therapy/exercise, medication, and injections Relief from Meds: 6  Family History  Problem Relation Age of Onset   Cancer Mother        stomach   Migraines Mother    Arthritis Mother    Emphysema Mother    Heart disease Father    Hypertension Father    Cancer Father        colon   Dementia Father    Hypertension Brother    Migraines Brother    Hypertension Brother    Migraines Brother    Alcohol abuse Brother    Bipolar disorder Brother    Migraines Daughter    Arthritis Maternal Grandmother    Cancer Maternal Grandmother        stomach    Diabetes Maternal Grandmother    Stroke Maternal Grandmother    Cancer Maternal Aunt        lung   Emphysema Maternal Aunt    Cancer Paternal Uncle        colon   Hypertension Maternal Aunt    Heart disease Maternal Aunt    Cancer Paternal Uncle        lung   Social History   Socioeconomic History   Marital status: Married    Spouse name: Not on file   Number of children: 1   Years of education: COLLEGE1   Highest education level: Not on file  Occupational History   Occupation: HOUSEWIFE    Employer: UNEMPLOYED  Tobacco Use   Smoking status: Never   Smokeless tobacco: Never  Vaping Use   Vaping Use: Never used  Substance and Sexual Activity   Alcohol use: No   Drug use: No   Sexual activity: Not on file  Other Topics Concern   Not on file  Social History Narrative   Patient is right handed.   Patient drinks caffeine occasionally.   Social Determinants of Health   Financial Resource Strain: Not on file  Food Insecurity: Not on file  Transportation Needs: Not on file  Physical Activity: Not  on file  Stress: Not on file  Social Connections: Not on file   Past Surgical History:  Procedure Laterality Date    kidney stones     ABDOMINAL ADHESION SURGERY     ABDOMINAL HYSTERECTOMY  1995   partial- hemmoraged after surgery-stitch came loose   APPENDECTOMY  1993   hemmoraged after surgery- stitch came loose   CERVICAL DISC SURGERY  06/04/2001   with fusion   [place]   colonoscopy     COLONOSCOPY     CYSTOSCOPY     FOOT SURGERY     lt   INCISIONAL HERNIA REPAIR N/A 04/22/2021   Procedure: LAPAROSCOPIC INCISIONAL HERNIA REPAIR WITH MESH;  Surgeon: Clovis Riley, MD;  Location: [facility];  Service: General;  Laterality: N/A;   jj stent  07/2002   with ureteroscopy, cystoscopy and then removal of stent in office   LAPAROSCOPIC LYSIS OF ADHESIONS N/A 04/22/2021   Procedure: LYSIS OF ADHESIONS;  Surgeon: Clovis Riley, MD;  Location: [facility];   Service: General;  Laterality: N/A;   LITHOTRIPSY     LYMPH NODE BIOPSY Right 03/06/2014   Procedure: right groin LYMPH NODE BIOPSY;  Surgeon: Merrie Roof, MD;  Location: [place];  Service: General;  Laterality: Right;   LYMPHADENECTOMY N/A 12/29/2015   Procedure: RETROPERITONEAL LYMPHADENECTOMY;  Surgeon: Alexis Frock, MD;  Location: [facility];  Service: Urology;  Laterality: N/A;   NECK SURGERY  2002   ROBOT ASSISTED LAPAROSCOPIC NEPHRECTOMY Left 12/29/2015   Procedure: XI ROBOTIC ASSISTED LEFT LAPAROSCOPIC NEPHRECTOMY;  Surgeon: Alexis Frock, MD;  Location: [facility];  Service: Urology;  Laterality: Left;   TONSILLECTOMY  1976   Past Surgical History:  Procedure Laterality Date    kidney stones     ABDOMINAL ADHESION SURGERY     ABDOMINAL HYSTERECTOMY  1995   partial- hemmoraged after surgery-stitch came loose   APPENDECTOMY  1993   hemmoraged after surgery- stitch came loose   CERVICAL DISC SURGERY  06/04/2001   with fusion   [place]   colonoscopy     COLONOSCOPY     CYSTOSCOPY     FOOT SURGERY     lt   INCISIONAL HERNIA REPAIR N/A 04/22/2021   Procedure: LAPAROSCOPIC INCISIONAL HERNIA REPAIR WITH MESH;  Surgeon: Clovis Riley, MD;  Location: [facility];  Service: General;  Laterality: N/A;   jj stent  07/2002   with ureteroscopy, cystoscopy and then removal of stent in office   LAPAROSCOPIC LYSIS OF ADHESIONS N/A 04/22/2021   Procedure: LYSIS OF ADHESIONS;  Surgeon: Clovis Riley, MD;  Location: [facility];  Service: General;  Laterality: N/A;   LITHOTRIPSY     LYMPH NODE BIOPSY Right 03/06/2014   Procedure: right groin LYMPH NODE BIOPSY;  Surgeon: Merrie Roof, MD;  Location: [place];  Service: General;  Laterality: Right;   LYMPHADENECTOMY N/A 12/29/2015   Procedure: RETROPERITONEAL LYMPHADENECTOMY;  Surgeon: Alexis Frock, MD;  Location: [facility];  Service: Urology;  Laterality: N/A;   NECK SURGERY  2002   ROBOT ASSISTED  LAPAROSCOPIC NEPHRECTOMY Left 12/29/2015   Procedure: XI ROBOTIC ASSISTED LEFT LAPAROSCOPIC NEPHRECTOMY;  Surgeon: Alexis Frock, MD;  Location: [facility];  Service: Urology;  Laterality: Left;   TONSILLECTOMY  1976   Past Medical History:  Diagnosis Date   Anxiety    Arthritis    Asthma    Bipolar 2 disorder ([place])    pt stated, "  I have Bipolar 2 and it is remission"   Bipolar affective ([place])    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   Full dentures    Gout    Headache(784.0)    migraines   Herpesviral infection    Hypertension    IBS (irritable bowel syndrome)    with diarrhea   Lymphadenopathy    Myalgia and myositis, unspecified    PAT (paroxysmal atrial tachycardia) (HCC)    asymptomatic 10 beat run on event monitor 09/2020   Pneumonia    PONV (postoperative nausea and vomiting)    PTSD (post-traumatic stress disorder)    Renal calculi    Restless legs syndrome (RLS)    Thoracic radiculopathy    Vitamin D deficiency    Wears glasses    BP (!) 143/81   Pulse (!) 105   Temp 99.3 F (37.4 C) (Oral)   Ht '5\' 5"'$  (1.651 m)   Wt 158 lb (71.7 kg)   SpO2 96%   BMI 26.29 kg/m   Opioid Risk Score:   Fall Risk Score:  `1  Depression screen PHQ 2/9  Depression screen [facility] 2/9 04/26/2021 03/01/2021 10/05/2020 04/09/2020 02/04/2020 10/06/2019 04/30/2019  Decreased Interest 0 0 '1 1 1 1 1  '$ Down, Depressed, Hopeless 0 0 - '1 1 1 1  '$ PHQ - 2 Score 0 0 '1 2 2 2 2  '$ Altered sleeping - - - - - - -  Tired, decreased energy - - - - - - -  Change in appetite - - - - - - -  Feeling bad or failure about yourself  - - - - - - -  Trouble concentrating - - - - - - -  Moving slowly or fidgety/restless - - - - - - -  Suicidal thoughts - - - - - - -  PHQ-9 Score - -  - - - - -  Difficult doing work/chores - - - - - - -  Some recent data might be hidden     Review of Systems  Musculoskeletal:  Positive for arthralgias, back pain, gait problem, myalgias and neck pain.  All other systems reviewed and are negative.     Objective:   Physical Exam Vitals and nursing note reviewed.  Constitutional:      Appearance: Normal appearance.  Cardiovascular:     Rate and Rhythm: Normal rate and regular rhythm.     Pulses: Normal pulses.     Heart sounds: Normal heart sounds.  Pulmonary:     Effort: Pulmonary effort is normal.     Breath sounds: Normal breath sounds.  Musculoskeletal:     Cervical back: Normal range of motion and neck supple.     Comments: Normal Muscle Bulk and Muscle Testing Reveals:  Upper Extremities: Full ROM and Muscle Strength 5/5  Thoracic Paraspinal Tenderness: T-7-T-9 Lumbar Paraspinal Tenderness: L-3-L-5 Bilateral Greater Trochanter Tenderness Lower Extremities: Full ROM and Muscle Strength 5/5 Transfer from [place] to [place] with EMS    Skin:    General: Skin is warm and dry.  Neurological:     Mental Status: She is alert and oriented to person, place, and time.  Psychiatric:  Mood and Affect: Mood normal.        Behavior: Behavior normal.         Assessment & Plan:  Chest Pain: Transferred to [name] ED for Evaluation via EMS 2 History of fibromyalgia with myofascial pain and multiple trigger points.Continue with Heat and exercise Regime. Continue with current medication regimen with  gabapentin. 06/13/2021 3. Chronic migraine headaches.Continue to Monitor. S/P Botox.on 05/30/2017. 06/13/2021 4. Midline Low Back Pain/ Lumbar Spondylosis/Lumbar degenerative disk disease, L4-5/ Lumbar Radiculitis: Continue with HEP and current medication regimen. Continue Hydrocodone 5/325 mg one tablet every 8 hours as needed #80. Marland KitchenContinue with slow weaning of Hydrocodone. 06/13/2021 5. 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 Urology Following. 06/13/2021. 6. 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 06/13/2021 7. Cervicalgia/ Cervical RadiculitisCervical Spondylosis: No complaints today. Continue current medication regiment with  Gabapentin. Continue  with HEP and Continue to monitor. 06/13/2021 8. Muscle Spasm: Continue current medication regimen with Flexeril as needed. 06/13/2021 9. Bilateral Greater Trochanter Bursitis: Continue to alternate with ice and heat therapy. Continue HEP as Tolerated. Continue to monitor. 06/13/2021 10. Bilateral Knee Pain: No complaints today. S/P Knee Injection by Dr Sharol Given on 02/23/2020. Orthopedics Following. Continue to Monitor. 06/13/2021   F/U in 1 month

## 2021-06-13 NOTE — ED Notes (Signed)
Pt states irritation in her IV

## 2021-06-13 NOTE — ED Triage Notes (Signed)
Pt arrived EMS, c/o left sided chest pain that radiated back shoulder and back. Started last night at rest. Complaining of N/V and shortness of breath  Was seen at rehab today and they called EMS.  1 nitro no relief.   Allergy to ASA   Normal 12 lead per EMS 20 PIV L.AC by EMS

## 2021-06-14 ENCOUNTER — Emergency Department (HOSPITAL_COMMUNITY): Payer: Medicare Other

## 2021-06-14 DIAGNOSIS — R0789 Other chest pain: Secondary | ICD-10-CM | POA: Diagnosis not present

## 2021-06-14 LAB — D-DIMER, QUANTITATIVE: D-Dimer, Quant: 1.66 ug/mL-FEU — ABNORMAL HIGH (ref 0.00–0.50)

## 2021-06-14 MED ORDER — DIPHENHYDRAMINE HCL 25 MG PO CAPS: 50.0000 mg | ORAL_CAPSULE | Freq: Once | ORAL | Status: AC

## 2021-06-14 MED ORDER — HYDROCORTISONE SOD SUC (PF) 250 MG IJ SOLR
200.0000 mg | Freq: Once | INTRAMUSCULAR | Status: DC
  Filled 2021-06-14: qty 200

## 2021-06-14 MED ORDER — HYDROCORTISONE SOD SUC (PF) 100 MG IJ SOLR
200.0000 mg | Freq: Once | INTRAMUSCULAR | Status: AC
  Administered 2021-06-14: 200 mg via INTRAVENOUS
  Filled 2021-06-14: qty 4

## 2021-06-14 MED ORDER — IOHEXOL 350 MG/ML SOLN
75.0000 mL | Freq: Once | INTRAVENOUS | Status: AC | PRN
  Administered 2021-06-14: 75 mL via INTRAVENOUS

## 2021-06-14 MED ORDER — DIPHENHYDRAMINE HCL 50 MG/ML IJ SOLN
50.0000 mg | Freq: Once | INTRAMUSCULAR | Status: AC
  Administered 2021-06-14: 50 mg via INTRAVENOUS
  Filled 2021-06-14: qty 1

## 2021-06-14 NOTE — ED Provider Notes (Signed)
[facility] EMERGENCY DEPARTMENT Provider Note   CSN: [id number] Arrival date & time: 06/13/21  1217     History Chief Complaint  Patient presents with   Chest Pain    Patty Salas is a 65 y.o. female.  HPI     This is a 65 year old female with a history of asthma, bipolar disorder, hypertension, follicular lymphoma, renal cell carcinoma who presents with chest pain.  Patient reports onset of chest pain last night at 9:30 PM.  She reports that it is sharp left-sided.  It does radiate to the jaw.  She has had some shortness of breath.  It is worse with deep breathing.  It is not worse with exertion.  Currently she rates her pain at 6 out of 10.  She reports that she has chronic pain but has been able to wean off of her fentanyl at home.  She did recently have hernia repair surgery in July.  She has not noted any lower extremity swelling.  She does report that her mother had significant phlebitis but no DVTs.  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[place]) 02/22/2016   Full dentures    Gout    Headache(784.0)    migraines   Herpesviral infection    Hypertension    IBS (irritable bowel syndrome)    with diarrhea   Lymphadenopathy    Myalgia and myositis, unspecified    PAT (paroxysmal atrial tachycardia) (HCC)    asymptomatic 10 beat run on event monitor 09/2020   Pneumonia    PONV (postoperative nausea and vomiting)    PTSD (post-traumatic stress disorder)    Renal calculi    Restless legs syndrome (RLS)    Thoracic radiculopathy    Vitamin D  deficiency    Wears glasses     Patient Active Problem List   Diagnosis Date Noted   S/P repair of ventral hernia 04/22/2021   Abdominal pain 03/07/2021   PAT (paroxysmal atrial tachycardia) ([place])    Renal cell cancer ([place]) 11/21/2018   Vitamin D deficiency 08/28/2017   Hypertension 08/28/2017   Herpes simplex- oral lesions 08/10/2017   Bipolar 1 disorder, mixed, moderate ([place]) 08/10/2017   History of anaphylaxis 08/10/2017   Postmenopausal syndrome [id number]   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[place]) 02/22/2016   History of kidney cancer 02/22/2016   Renal mass 12/29/2015   Left lateral epicondylitis 09/06/2015   De Quervain's tenosynovitis, right 04/24/2014   Enlarged lymph node 02/12/2014   BPPV (benign paroxysmal positional vertigo) 12/05/2013   Cervical spondylosis without myelopathy 09/17/2013   Post concussion syndrome 09/17/2013   Nephrolithiasis 02/11/2013   Carpal tunnel syndrome 01/01/2013   Wrist tendonitis 01/10/2012   Fibromyalgia/myofascial pain syndrome 12/06/2011   Lumbar spondylosis 12/06/2011   Lumbar degenerative disc disease 12/06/2011   Depression with anxiety 12/06/2011   Migraine aura, persistent, intractable 12/06/2011    Past Surgical History:  Procedure Laterality Date  kidney stones     ABDOMINAL ADHESION SURGERY     ABDOMINAL HYSTERECTOMY  1995   partial- hemmoraged after surgery-stitch came loose   APPENDECTOMY  1993   hemmoraged after surgery- stitch came loose   CERVICAL DISC SURGERY  06/04/2001   with fusion   [place]   colonoscopy     COLONOSCOPY     CYSTOSCOPY     FOOT SURGERY     lt   INCISIONAL HERNIA REPAIR N/A 04/22/2021   Procedure: LAPAROSCOPIC INCISIONAL HERNIA REPAIR WITH MESH;  Surgeon: Clovis Riley, MD;  Location: [facility];  Service: General;  Laterality: N/A;   jj stent  07/2002   with ureteroscopy, cystoscopy and then removal of stent in office   LAPAROSCOPIC LYSIS OF ADHESIONS  N/A 04/22/2021   Procedure: LYSIS OF ADHESIONS;  Surgeon: Clovis Riley, MD;  Location: [facility];  Service: General;  Laterality: N/A;   LITHOTRIPSY     LYMPH NODE BIOPSY Right 03/06/2014   Procedure: right groin LYMPH NODE BIOPSY;  Surgeon: Merrie Roof, MD;  Location: [place];  Service: General;  Laterality: Right;   LYMPHADENECTOMY N/A 12/29/2015   Procedure: RETROPERITONEAL LYMPHADENECTOMY;  Surgeon: Alexis Frock, MD;  Location: [facility];  Service: Urology;  Laterality: N/A;   NECK SURGERY  2002   ROBOT ASSISTED LAPAROSCOPIC NEPHRECTOMY Left 12/29/2015   Procedure: XI ROBOTIC ASSISTED LEFT LAPAROSCOPIC NEPHRECTOMY;  Surgeon: Alexis Frock, MD;  Location: [facility];  Service: Urology;  Laterality: Left;   TONSILLECTOMY  1976     OB History   No obstetric history on file.     Family History  Problem Relation Age of Onset   Cancer Mother        stomach   Migraines Mother    Arthritis Mother    Emphysema Mother    Heart disease Father    Hypertension Father    Cancer Father        colon   Dementia Father    Hypertension Brother    Migraines Brother    Hypertension Brother    Migraines Brother    Alcohol abuse Brother    Bipolar disorder Brother    Migraines Daughter    Arthritis Maternal Grandmother    Cancer Maternal Grandmother        stomach   Diabetes Maternal Grandmother    Stroke Maternal Grandmother    Cancer Maternal Aunt        lung   Emphysema Maternal Aunt    Cancer Paternal Uncle        colon   Hypertension Maternal Aunt    Heart disease Maternal Aunt    Cancer Paternal Uncle        lung    Social History   Tobacco Use   Smoking status: Never   Smokeless tobacco: Never  Vaping Use   Vaping Use: Never used  Substance Use Topics   Alcohol use: No   Drug use: No    Home Medications Prior to Admission medications   Medication Sig Start Date End Date Taking? Authorizing Provider  acetaminophen (TYLENOL) 500 MG tablet Take 500  mg by mouth every 6 (six) hours as needed for moderate pain.    [provider]  albuterol (VENTOLIN HFA) 108 (90 Base) MCG/ACT inhaler Inhale 2 puffs into the lungs every 6 (six) hours as needed for wheezing. 11/02/20   Lorrene Reid, PA-C  almotriptan (AXERT) 12.5 MG tablet Take 1 tablet (12.5  mg total) by mouth as needed for migraine. TAKE 1 TABLET BY MOUTH AS NEEDED FOR MIGRAINE (MAX 2 TABS PER WEEK). THIS IS 90-DAY RX. PAYS CASH 06/07/21   Kathrynn Ducking, MD  bisacodyl (DULCOLAX) 5 MG EC tablet Take 10 mg by mouth daily as needed for mild constipation.     [provider]  bisacodyl (DULCOLAX) 5 MG EC tablet daily.    [provider]  cyclobenzaprine (FLEXERIL) 10 MG tablet Take 1 tablet (10 mg total) by mouth 3 (three) times daily. 06/01/21   Bayard Hugger, NP  docusate sodium (COLACE) 100 MG capsule Take 100 mg by mouth daily as needed for mild constipation.     [provider]  EPINEPHrine 0.3 mg/0.3 mL IJ SOAJ injection Inject 0.3 mg into the muscle as needed for anaphylaxis. 11/02/20   Abonza, Herb Grays, PA-C  fentaNYL (DURAGESIC) 12 MCG/HR PLACE 1 PATCH ONTO THE SKIN EVERY 3 DAYS. 01/03/21   [provider]  fexofenadine (ALLEGRA) 180 MG tablet Take 1 tablet (180 mg total) by mouth daily. 11/12/18   Opalski, Neoma Laming, DO  furosemide (LASIX) 20 MG tablet Take 1 tablet (20 mg total) by mouth as needed for edema. 05/27/21   Josue Hector, MD  gabapentin (NEURONTIN) 300 MG capsule Take 600 mg by mouth at bedtime.    Ricard Dillon, MD  HYDROcodone-acetaminophen (NORCO/VICODIN) 5-325 MG tablet Take 1 tablet by mouth every 8 (eight) hours as needed for moderate pain. 06/01/21   Bayard Hugger, NP  hyoscyamine (ANASPAZ) 0.125 MG TBDP disintergrating tablet hyoscyamine 0.125 mg disintegrating tablet  TAKE 1 TABLET BY MOUTH 4 TIMES DAILY AS NEEDED FOR UP TO 60 DOSES (CRAMPING, PAIN) 03/03/21   [provider]  hyoscyamine (LEVSIN, ANASPAZ)  0.125 MG tablet Take 0.125 mg by mouth every 4 (four) hours as needed for bladder spasms or cramping.     [provider]  ketorolac (TORADOL) 60 MG/2ML SOLN injection ketorolac 60 mg/2 mL intramuscular solution  Inject '60mg'$  IM now    [provider]  levofloxacin (LEVAQUIN) 500 MG tablet Take 1 tablet by mouth daily. 11/03/20   [provider]  Lidocaine 4 % PTCH Lidocaine Pain Relief 4 % topical patch  Apply 1 patch to affected area for 12 hours then remove    [provider]  LORazepam (ATIVAN) 1 MG tablet Take 1 mg by mouth 3 (three) times daily.    [provider]  methylPREDNISolone (MEDROL DOSEPAK) 4 MG TBPK tablet See admin instructions. 03/25/21   [provider]  Polyethyl Glycol-Propyl Glycol (LUBRICATING EYE DROPS) 0.4-0.3 % SOLN Place 1 drop into both eyes 3 (three) times daily as needed (dry eyes).    [provider]  promethazine (PHENERGAN) 25 MG tablet TAKE 0.5 TABLETS (12.5 MG TOTAL) BY MOUTH EVERY 6 (SIX) HOURS AS NEEDED FOR NAUSEA OR VOMITING. 06/08/21   Kathrynn Ducking, MD  Trospium Chloride 60 MG CP24 Take 1 capsule by mouth every morning.  03/28/12   Festus Aloe, MD    Allergies    Cymbalta [duloxetine hcl], Divalproex sodium, Duract [bromfenac], Hydrochlorothiazide, Imitrex [sumatriptan succinate], Latex, Mellaril, Olanzapine, Penicillins, Topamax, Aripiprazole, Aspirin, Dalmane [flurazepam hcl], Darifenacin hydrobromide er, Metoclopramide hcl, Seroquel [quetiapine fumerate], Statins, Stelazine, Thorazine [chlorpromazine hcl], Abilify [aripiprazole], Alka-seltzer [aspirin effervescent], Allopurinol, Biofreeze [menthol (topical analgesic)], Capzasin [capsaicin], Clindamycin/lincomycin, Clove oil, Colchicine, Cough drops [benzocaine], Diflucan [fluconazole], Garlic, Iohexol, Ivp dye [iodinated diagnostic agents], Lyrica [pregabalin], Metamucil [psyllium], Miralax [polyethylene glycol], Myrbetriq [mirabegron], Nsaids,  Other, Oxybutynin, Potassium-containing compounds, Prednisone, Reglan [metoclopramide], Renografin [diatrizoate], Risperdal [risperidone], Seroquel [quetiapine fumarate], Simvastatin, Urocit - k [potassium citrate], Zyprexa [olanzapine], Avelox [moxifloxacin hcl in nacl], Barium-containing compounds, Diclofenac, Doxycycline, E-mycin [erythromycin base], Fiorinal [butalbital-aspirin-caffeine], Flagyl [metronidazole hcl], Lamictal [lamotrigine], Pregabalin, Risperidone, and Toradol [ketorolac tromethamine]  Review of Systems   Review of Systems  Constitutional:  Negative for fever.  Respiratory:  Positive for shortness of breath.   Cardiovascular:  Positive for chest pain. Negative for leg swelling.  Gastrointestinal:  Negative for abdominal pain, nausea and vomiting.  Genitourinary:  Negative for dysuria.  All other systems reviewed and are negative.  Physical Exam Updated Vital Signs BP 139/71   Pulse 98   Temp 98.2 F (36.8 C) (Oral)   Resp 15   SpO2 98%   Physical Exam Vitals and nursing note reviewed.  Constitutional:      Appearance: She is well-developed. She is not ill-appearing.  HENT:     Head: Normocephalic and atraumatic.  Eyes:     Pupils: Pupils are equal, round, and reactive to light.  Cardiovascular:     Rate and Rhythm: Normal rate and regular rhythm.     Heart sounds: Normal heart sounds.  Pulmonary:     Effort: Pulmonary effort is normal. No respiratory distress.     Breath sounds: No wheezing.  Abdominal:     General: Bowel sounds are normal.     Palpations: Abdomen is soft.  Musculoskeletal:     Cervical back: Neck supple.     Right lower leg: No tenderness. No edema.     Left lower leg: No tenderness. No edema.  Skin:    General: Skin is warm and dry.  Neurological:     Mental Status: She is alert and oriented to person, place, and time.  Psychiatric:        Mood and Affect: Mood is not anxious.    ED Results / Procedures / Treatments   Labs (all  labs ordered are listed, but only abnormal results are displayed) Labs Reviewed  CBC - Abnormal; Notable for the following components:      Result Value   RBC 3.83 (*)    Hemoglobin 11.7 (*)    All other components within normal limits  D-DIMER, QUANTITATIVE - Abnormal; Notable for the following components:   D-Dimer, Quant 1.66 (*)    All other components within normal limits  COMPREHENSIVE METABOLIC PANEL  TROPONIN I (HIGH SENSITIVITY)  TROPONIN I (HIGH SENSITIVITY)    EKG EKG Interpretation  Date/Time:  Monday June 13 2021 12:35:14 EDT Ventricular Rate:  96 PR Interval:  126 QRS Duration: 76 QT Interval:  364 QTC Calculation: 459 R Axis:   65 Text Interpretation: Normal sinus rhythm Normal ECG Confirmed by Thayer Jew [phone]) on 06/13/2021 11:54:23 PM  Radiology DG Chest 2 View  Result Date: 06/13/2021 CLINICAL DATA:  Chest pain EXAM: CHEST - 2 VIEW COMPARISON:  02/06/2007 FINDINGS: Lungs are well expanded, symmetric, and clear. No pneumothorax or pleural effusion. Cardiac size within normal limits. Pulmonary vascularity is normal. Osseous structures are age-appropriate. Mild thoracic dextroscoliosis is unchanged. No acute bone abnormality. IMPRESSION: No active cardiopulmonary disease. Electronically Signed   By: Fidela Salisbury M.D.   On: 06/13/2021 14:37   CT Angio Chest PE W and/or Wo Contrast  Result Date: 06/14/2021 CLINICAL DATA:  Positive D-dimer. Clinical suspicion for pulmonary embolus. History of lymphoma. EXAM: CT ANGIOGRAPHY CHEST WITH CONTRAST TECHNIQUE: Multidetector CT imaging of the chest was performed using the  standard protocol during bolus administration of intravenous contrast. Multiplanar CT image reconstructions and MIPs were obtained to evaluate the vascular anatomy. CONTRAST:  45m OMNIPAQUE IOHEXOL 350 MG/ML SOLN COMPARISON:  None. FINDINGS: Cardiovascular: The heart size is normal. No substantial pericardial effusion. Mild atherosclerotic  calcification is noted in the wall of the thoracic aorta. No large central pulmonary embolus. No evidence for lobar or segmental pulmonary arterial filling defect. The subsegmental branches to the lower lobes bilaterally are obscured by respiratory motion. Mediastinum/Nodes: No mediastinal lymphadenopathy. There is no hilar lymphadenopathy. The esophagus has normal imaging features. There is no axillary lymphadenopathy. Lungs/Pleura: 4 mm right upper lobe nodule identified on 82/7. 3 mm right lower lobe pulmonary nodule identified on 71/7. No focal airspace consolidation. No pleural effusion. Upper Abdomen: Left nephrectomy. Musculoskeletal: No worrisome lytic or sclerotic osseous abnormality. Review of the MIP images confirms the above findings. IMPRESSION: 1. No CT evidence for large central pulmonary embolus. The subsegmental branches to the lower lobes bilaterally are obscured by respiratory motion. 2. Tiny right lung nodules measuring up to 4 mm. No follow-up needed if patient is low-risk (and has no known or suspected primary neoplasm). Non-contrast chest CT can be considered in 12 months if patient is high-risk. This recommendation follows the consensus statement: Guidelines for Management of Incidental Pulmonary Nodules Detected on CT Images: From the Fleischner Society 2017; Radiology 2017; 284:228-243. 3. Left nephrectomy. 4. Aortic Atherosclerosis (ICD10-I70.0). Electronically Signed   By: EMisty StanleyM.D.   On: 06/14/2021 05:32    Procedures Procedures   Medications Ordered in ED Medications  diphenhydrAMINE (BENADRYL) capsule 50 mg ( Oral See Alternative 06/14/21 0401)    Or  diphenhydrAMINE (BENADRYL) injection 50 mg (50 mg Intravenous Given 06/14/21 0401)  hydrocortisone sodium succinate (SOLU-CORTEF) 100 MG injection 200 mg (200 mg Intravenous Given 06/14/21 0154)  iohexol (OMNIPAQUE) 350 MG/ML injection 75 mL (75 mLs Intravenous Contrast Given 06/14/21 0509)    ED Course  I have  reviewed the triage vital signs and the nursing notes.  Pertinent labs & imaging results that were available during my care of the patient were reviewed by me and considered in my medical decision making (see chart for details).    MDM Rules/Calculators/A&P                           Patient presents with chest pain.  Ongoing for greater than 24 hours.  She is nontoxic and vital signs are reassuring.  She did recently have a surgery approximately 8 weeks ago and has a history of cancer but is in remission.  No history of blood clots.  EKG is completely normal without ischemic or arrhythmic changes.  Troponin x2 negative.  Doubt ACS.  PE is a consideration.  Screening D-dimer was sent.  Chest x-ray without evidence of pneumothorax or pneumonia.  Basic lab work is reassuring.  D-dimer is elevated at 1.66.  Patient was premedicated for CT scan.  CT scan shows no evidence of pulmonary embolism.  She does have some small lung nodules.  She is a non-smoker.  However she does have a history of both renal cell carcinoma and follicular lymphoma.  For this reason, would recommend repeat CT in 12 months.  After history, exam, and medical workup I feel the patient has been appropriately medically screened and is safe for discharge home. Pertinent diagnoses were discussed with the patient. Patient was given return precautions.  Final Clinical Impression(s) / ED  Diagnoses Final diagnoses:  Atypical chest pain  Lung nodule    Rx / DC Orders ED Discharge Orders     None        Teaghan Melrose, Barbette Hair, MD 06/14/21 [phone]

## 2021-06-14 NOTE — ED Notes (Signed)
Patient transported to CT 

## 2021-06-14 NOTE — ED Notes (Signed)
E-signature pad unavailable at time of pt discharge. This RN discussed discharge materials with pt and answered all pt questions. Pt stated understanding of discharge material. ? ?

## 2021-06-14 NOTE — ED Notes (Signed)
CT called for transport after benadryl administration

## 2021-06-14 NOTE — Discharge Instructions (Addendum)
You were seen today for chest discomfort.  Your CT scan does not show any evidence of large pulmonary embolism or blood clot.  Your heart testing is reassuring.  You do have several small tiny lung nodules.  You are relatively low risk as you are a non-smoker.  However, given your history of cancer, you should have repeat CT in 12 months to ensure stability.

## 2021-06-15 ENCOUNTER — Telehealth: Payer: Self-pay

## 2021-06-15 NOTE — Telephone Encounter (Signed)
PT just wanted to update [place] on her health , she is doing better they did find lymph nodes on her lungs and has appt scheduled with her cancer doctor . Pt said she is fine on medication right now . Will call us back if anything has scheduled appt with [place] on sept 27th . Wanted to also thank [name] for everything .

## 2021-06-18 ENCOUNTER — Other Ambulatory Visit: Payer: Self-pay | Admitting: Cardiovascular Disease

## 2021-06-21 ENCOUNTER — Telehealth: Payer: Self-pay | Admitting: *Deleted

## 2021-06-21 LAB — TOXASSURE SELECT,+ANTIDEPR,UR

## 2021-06-21 NOTE — Telephone Encounter (Signed)
Urine drug screen for this encounter is consistent for prescribed medication 

## 2021-06-23 ENCOUNTER — Telehealth: Payer: Self-pay | Admitting: *Deleted

## 2021-06-23 NOTE — Telephone Encounter (Signed)
Patty Salas called concerned about her drug screen creatinine being different from her BMP creatinine. I explained one is blood creatinine and the other is urine  so she should not be concerned. Her UDS was appropriate.

## 2021-06-28 ENCOUNTER — Encounter (HOSPITAL_BASED_OUTPATIENT_CLINIC_OR_DEPARTMENT_OTHER): Payer: Medicare Other | Admitting: Registered Nurse

## 2021-06-28 ENCOUNTER — Encounter: Payer: Self-pay | Admitting: Registered Nurse

## 2021-06-28 ENCOUNTER — Other Ambulatory Visit: Payer: Self-pay

## 2021-06-28 VITALS — BP 151/88 | HR 100 | Temp 98.4°F | Ht 65.0 in | Wt 149.4 lb

## 2021-06-28 DIAGNOSIS — M542 Cervicalgia: Secondary | ICD-10-CM | POA: Diagnosis not present

## 2021-06-28 DIAGNOSIS — M5412 Radiculopathy, cervical region: Secondary | ICD-10-CM

## 2021-06-28 DIAGNOSIS — M7061 Trochanteric bursitis, right hip: Secondary | ICD-10-CM

## 2021-06-28 DIAGNOSIS — Z5181 Encounter for therapeutic drug level monitoring: Secondary | ICD-10-CM

## 2021-06-28 DIAGNOSIS — M546 Pain in thoracic spine: Secondary | ICD-10-CM | POA: Diagnosis not present

## 2021-06-28 DIAGNOSIS — G894 Chronic pain syndrome: Secondary | ICD-10-CM | POA: Diagnosis present

## 2021-06-28 DIAGNOSIS — M7062 Trochanteric bursitis, left hip: Secondary | ICD-10-CM | POA: Diagnosis present

## 2021-06-28 DIAGNOSIS — R0789 Other chest pain: Secondary | ICD-10-CM | POA: Diagnosis present

## 2021-06-28 DIAGNOSIS — G8929 Other chronic pain: Secondary | ICD-10-CM

## 2021-06-28 DIAGNOSIS — Z79891 Long term (current) use of opiate analgesic: Secondary | ICD-10-CM | POA: Diagnosis present

## 2021-06-28 DIAGNOSIS — M47812 Spondylosis without myelopathy or radiculopathy, cervical region: Secondary | ICD-10-CM

## 2021-06-28 DIAGNOSIS — M5416 Radiculopathy, lumbar region: Secondary | ICD-10-CM

## 2021-06-28 DIAGNOSIS — M797 Fibromyalgia: Secondary | ICD-10-CM | POA: Diagnosis present

## 2021-06-28 MED ORDER — HYDROCODONE-ACETAMINOPHEN 5-325 MG PO TABS: 1.0000 | ORAL_TABLET | Freq: Three times a day (TID) | ORAL | 0 refills | Status: DC | PRN

## 2021-06-28 NOTE — Progress Notes (Signed)
Subjective:    Patient ID: Patty Salas, female    DOB: August 13, 1956, 65 y.o.   MRN: [id number]  HPI: Patty Salas is a 65 y.o. female who returns for follow up appointment for chronic pain and medication refill. She states her  pain is located in her neck radiating into her left shoulder, mid- lower back pain radiating into her bilateral hips and bilateral lower extremities. He rates his pain 8. His current exercise regime is walking and performing stretching exercises.  [name] equivalent is 13.33 MME. She is also prescribed Lorazepam by Dr. Reece Levy .We have discussed the black box warning of using opioids and benzodiazepines. I highlighted the dangers of using these drugs together and discussed the adverse events including respiratory suppression, overdose, cognitive impairment and importance of compliance with current regimen. We will continue to monitor and adjust as indicated.  she is being closely monitored and under the care of her psychiatrist.  Last UDS was Performed on 06/13/2021, it was consistent.     Pain Inventory Average Pain 8 Pain Right Now 8 My pain is constant, sharp, burning, stabbing, tingling, and aching  In the last 24 hours, has pain interfered with the following? General activity 8 Relation with others 8 Enjoyment of life 8 What TIME of day is your pain at its worst? morning , daytime, evening, and night Sleep (in general) Fair  Pain is worse with: walking, bending, standing, and some activites Pain improves with: rest, heat/ice, therapy/exercise, medication, and injections Relief from Meds: 5  Family History  Problem Relation Age of Onset  . Cancer Mother        stomach  . Migraines Mother   . Arthritis Mother   . Emphysema Mother   . Heart disease Father   . Hypertension Father   . Cancer Father        colon  . Dementia Father   . Hypertension Brother   . Migraines Brother   . Hypertension Brother   . Migraines Brother   . Alcohol  abuse Brother   . Bipolar disorder Brother   . Migraines Daughter   . Arthritis Maternal Grandmother   . Cancer Maternal Grandmother        stomach  . Diabetes Maternal Grandmother   . Stroke Maternal Grandmother   . Cancer Maternal Aunt        lung  . Emphysema Maternal Aunt   . Cancer Paternal Uncle        colon  . Hypertension Maternal Aunt   . Heart disease Maternal Aunt   . Cancer Paternal Uncle        lung   Social History   Socioeconomic History  . Marital status: Married    Spouse name: Not on file  . Number of children: 1  . Years of education: COLLEGE1  . Highest education level: Not on file  Occupational History  . Occupation: HOUSEWIFE    Employer: UNEMPLOYED  Tobacco Use  . Smoking status: Never  . Smokeless tobacco: Never  Vaping Use  . Vaping Use: Never used  Substance and Sexual Activity  . Alcohol use: No  . Drug use: No  . Sexual activity: Not on file  Other Topics Concern  . Not on file  Social History Narrative   Patient is right handed.   Patient drinks caffeine occasionally.   Social Determinants of Health   Financial Resource Strain: Not on file  Food Insecurity: Not on file  Transportation Needs:  Not on file  Physical Activity: Not on file  Stress: Not on file  Social Connections: Not on file   Past Surgical History:  Procedure Laterality Date  .  kidney stones    . ABDOMINAL ADHESION SURGERY    . ABDOMINAL HYSTERECTOMY  1995   partial- hemmoraged after surgery-stitch came loose  . APPENDECTOMY  1993   hemmoraged after surgery- stitch came loose  . CERVICAL DISC SURGERY  06/04/2001   with fusion  . CHOLECYSTECTOMY  1985  . colonoscopy    . COLONOSCOPY    . CYSTOSCOPY    . FOOT SURGERY     lt  . INCISIONAL HERNIA REPAIR N/A 04/22/2021   Procedure: LAPAROSCOPIC INCISIONAL HERNIA REPAIR WITH MESH;  Surgeon: Clovis Riley, MD;  Location: [facility];  Service: General;  Laterality: N/A;  . jj stent  07/2002   with ureteroscopy,  cystoscopy and then removal of stent in office  . LAPAROSCOPIC LYSIS OF ADHESIONS N/A 04/22/2021   Procedure: LYSIS OF ADHESIONS;  Surgeon: Clovis Riley, MD;  Location: [facility];  Service: General;  Laterality: N/A;  . LITHOTRIPSY    . LYMPH NODE BIOPSY Right 03/06/2014   Procedure: right groin LYMPH NODE BIOPSY;  Surgeon: Merrie Roof, MD;  Location: [facility];  Service: General;  Laterality: Right;  . LYMPHADENECTOMY N/A 12/29/2015   Procedure: RETROPERITONEAL LYMPHADENECTOMY;  Surgeon: Alexis Frock, MD;  Location: [facility];  Service: Urology;  Laterality: N/A;  . NECK SURGERY  2002  . ROBOT ASSISTED LAPAROSCOPIC NEPHRECTOMY Left 12/29/2015   Procedure: XI ROBOTIC ASSISTED LEFT LAPAROSCOPIC NEPHRECTOMY;  Surgeon: Alexis Frock, MD;  Location: [facility];  Service: Urology;  Laterality: Left;  . TONSILLECTOMY  1976   Past Surgical History:  Procedure Laterality Date  .  kidney stones    . ABDOMINAL ADHESION SURGERY    . ABDOMINAL HYSTERECTOMY  1995   partial- hemmoraged after surgery-stitch came loose  . APPENDECTOMY  1993   hemmoraged after surgery- stitch came loose  . CERVICAL DISC SURGERY  06/04/2001   with fusion  . CHOLECYSTECTOMY  1985  . colonoscopy    . COLONOSCOPY    . CYSTOSCOPY    . FOOT SURGERY     lt  . INCISIONAL HERNIA REPAIR N/A 04/22/2021   Procedure: LAPAROSCOPIC INCISIONAL HERNIA REPAIR WITH MESH;  Surgeon: Clovis Riley, MD;  Location: [facility];  Service: General;  Laterality: N/A;  . jj stent  07/2002   with ureteroscopy, cystoscopy and then removal of stent in office  . LAPAROSCOPIC LYSIS OF ADHESIONS N/A 04/22/2021   Procedure: LYSIS OF ADHESIONS;  Surgeon: Clovis Riley, MD;  Location: [facility];  Service: General;  Laterality: N/A;  . LITHOTRIPSY    . LYMPH NODE BIOPSY Right 03/06/2014   Procedure: right groin LYMPH NODE BIOPSY;  Surgeon: Merrie Roof, MD;  Location: [facility];  Service: General;  Laterality: Right;  .  LYMPHADENECTOMY N/A 12/29/2015   Procedure: RETROPERITONEAL LYMPHADENECTOMY;  Surgeon: Alexis Frock, MD;  Location[facility];  Service: Urology;  Laterality: N/A;  . NECK SURGERY  2002  . ROBOT ASSISTED LAPAROSCOPIC NEPHRECTOMY Left 12/29/2015   Procedure: XI ROBOTIC ASSISTED LEFT LAPAROSCOPIC NEPHRECTOMY;  Surgeon: Alexis Frock, MD;  Location: [facility];  Service: Urology;  Laterality: Left;  . TONSILLECTOMY  1976   Past Medical History:  Diagnosis Date  . Anxiety   . Arthritis   . Asthma   . Bipolar 2  disorder ([place])    pt stated, "I have Bipolar 2 and it is remission"  . Bipolar affective ([place])   . Calcifying tendinitis of shoulder   . Carpal tunnel syndrome   . Cervical facet syndrome   . Chronic pain syndrome   . Complication of anesthesia   . Depression   . Diverticulosis   . Falls   . Fibromyalgia   . 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 Full dentures   . Gout   . Headache(784.0)    migraines  . Herpesviral infection   . Hypertension   . IBS (irritable bowel syndrome)    with diarrhea  . Lymphadenopathy   . Myalgia and myositis, unspecified   . PAT (paroxysmal atrial tachycardia) ([facility])    asymptomatic 10 beat run on event monitor 09/2020  . Pneumonia   . PONV (postoperative nausea and vomiting)   . PTSD (post-traumatic stress disorder)   . Renal calculi   . Restless legs syndrome (RLS)   . Thoracic radiculopathy   . Vitamin D deficiency   . Wears glasses    BP (!) 154/85   Pulse (!) 105   Temp 98.4 F (36.9 C) (Oral)   Ht 5\' 5"  (1.651 m)   Wt 149 lb 6.4 oz (67.8 kg)   SpO2 97%   BMI 24.86 kg/m   Opioid Risk Score:   Fall Risk Score:  `1  Depression screen PHQ 2/9  Depression screen [facility] 2/9 06/28/2021 04/26/2021 03/01/2021 10/05/2020 04/09/2020 02/04/2020 10/06/2019  Decreased Interest 1 0 0 1 1 1 1   Down, Depressed, Hopeless 1 0 0 - 1 1 1   PHQ - 2 Score 2 0 0 1 2 2 2   Altered sleeping - - - - - - -  Tired, decreased  energy - - - - - - -  Change in appetite - - - - - - -  Feeling bad or failure about yourself  - - - - - - -  Trouble concentrating - - - - - - -  Moving slowly or fidgety/restless - - - - - - -  Suicidal thoughts - - - - - - -  PHQ-9 Score - - - - - - -  Difficult doing work/chores - - - - - - -  Some recent data might be hidden     Review of Systems  Musculoskeletal:  Positive for arthralgias, back pain, gait problem and myalgias.       Bilateral shoulder, hip, leg, knee, ankle pain   All other systems reviewed and are negative.     Objective:   Physical Exam Vitals and nursing note reviewed.  Constitutional:      Appearance: Normal appearance.  Neck:     Comments: Cervical Paraspinal Tenderness: C-5-C-6 Cardiovascular:     Rate and Rhythm: Normal rate and regular rhythm.     Pulses: Normal pulses.     Heart sounds: Normal heart sounds.  Pulmonary:     Effort: Pulmonary effort is normal.     Breath sounds: Normal breath sounds.  Musculoskeletal:     Cervical back: Normal range of motion and neck supple.     Comments: Normal Muscle Bulk and Muscle Testing Reveals:  Upper Extremities: Full ROM and Muscle Strength 5/5 Bilateral AC Joint Tenderness Thoracic Paraspinal Tenderness: T-7-T-9  Lumbar Paraspinal Tenderness: L-3-L-5 Bilateral Greater Trochanter Tenderness Lower Extremities: Full ROM and Muscle Strength 5/5 Arises from Table slowly using cane for support Narrow  Based  Gait     Skin:    General: Skin is warm and dry.  Neurological:     Mental Status: She is alert and oriented to person, place, and time.  Psychiatric:        Mood and Affect: Mood normal.        Behavior: Behavior normal.         Assessment & Plan:  1 History of fibromyalgia with myofascial pain and multiple trigger points.Continue with Heat and exercise Regime. Continue with current medication regimen with  gabapentin. 06/28/2021 2 Chronic migraine headaches.Continue to Monitor. S/P  Botox.on 05/30/2017. 06/28/2021 3. Midline Low Back Pain/ Lumbar Spondylosis/Lumbar degenerative disk disease, L4-5/ Lumbar Radiculitis: Continue with HEP and current medication regimen. Continue Hydrocodone 5/325 mg one tablet every 8 hours as needed #80. Marland KitchenContinue with slow weaning of Hydrocodone. 06/28/2021 4. 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 Urology Following. 06/28/2021. 5. 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 06/28/2021 6. Cervicalgia/ Cervical RadiculitisCervical Spondylosis: No complaints today. Continue current medication regiment with  Gabapentin. Continue  with HEP and Continue to monitor. 06/28/2021 7. Muscle Spasm: Continue current medication regimen with Flexeril as needed. 06/28/2021 8. Bilateral Greater Trochanter Bursitis: Continue to alternate with ice and heat therapy. Continue HEP as Tolerated. Continue to monitor. 06/28/2021 9. Bilateral Knee Pain: No complaints today. S/P Knee Injection by Dr Sharol Given on 02/23/2020. Orthopedics Following. Continue to Monitor. 06/28/2021   F/U in 1 month

## 2021-07-21 ENCOUNTER — Telehealth: Payer: Self-pay | Admitting: Cardiology

## 2021-07-21 DIAGNOSIS — R072 Precordial pain: Secondary | ICD-10-CM

## 2021-07-21 NOTE — Telephone Encounter (Signed)
Spoke with the patient who is calling to go over her visit to the ER back in September. Patient provided details in regards to her stay in the ER and follow ups that she has been having with her other providers.  Patient reports that she has only had a few episodes of chest pain since her visit to the ER but they were not as severe. Patient has a follow up appointment with Dr. Radford Pax on 12/01 and is calling to make sure Dr. Radford Pax knows about her ER visit and to make sure she does not need to be seen sooner. Patient also reports that in the ambulance on the way to the ER they gave her nitroglycerin which relieved her pain. She is wondering if she could get a prescription for nitro just so she can have it on hand in case its needed.  Advised patient that I will make Dr. Radford Pax aware of her ER visit and that we will keep her appointment in December for now and to call back if she develops any further chest pain.

## 2021-07-21 NOTE — Telephone Encounter (Signed)
Patient returning call to [facility]

## 2021-07-21 NOTE — Telephone Encounter (Signed)
Left message for patient to call back  

## 2021-07-21 NOTE — Telephone Encounter (Signed)
Patient called wanted to speak with the nurse about her ER visit that she had, she had to go by the ambulance.  Please advise

## 2021-07-22 ENCOUNTER — Telehealth: Payer: Self-pay

## 2021-07-22 NOTE — Telephone Encounter (Signed)
1) DDimer is non specific, can be high for many reasons.  2) where did she see enlarge lymph nodes on the scan? I did not see any report on that 3) She should discuss vaccination program with her primary care physician

## 2021-07-22 NOTE — Telephone Encounter (Signed)
Called back and given below message. She verbalized understanding. She will contact PCP.

## 2021-07-22 NOTE — Telephone Encounter (Signed)
Returned her call. She is wanting to review her ED visit on 9/12. She is asking if Dr. Alvy Bimler can review labs, she is mostly concerned about the d-dimer being elevated. Asking if Dr. Alvy Bimler can review 9/13 CT in ED, she is concerned about enlarged lymph nodes? She is concerned and asking if you think she needs a earlier appt.  Asking for your recommendation on Shingles vaccine and which pneumococcal vaccine do you recommend? She had on 10/12/2006 pneumococcal  polysaccharide-23.

## 2021-07-25 NOTE — Telephone Encounter (Signed)
Left message for patient to call back  

## 2021-07-25 NOTE — Telephone Encounter (Signed)
Patient returned call

## 2021-07-26 ENCOUNTER — Telehealth: Payer: Self-pay | Admitting: *Deleted

## 2021-07-26 DIAGNOSIS — M797 Fibromyalgia: Secondary | ICD-10-CM

## 2021-07-26 DIAGNOSIS — G894 Chronic pain syndrome: Secondary | ICD-10-CM

## 2021-07-26 MED ORDER — METOPROLOL TARTRATE 100 MG PO TABS: 100.0000 mg | ORAL_TABLET | Freq: Once | ORAL | 0 refills | Status: DC

## 2021-07-26 MED ORDER — PREDNISONE 50 MG PO TABS: ORAL_TABLET | ORAL | 0 refills | Status: DC

## 2021-07-26 NOTE — Telephone Encounter (Signed)
Spoke with the patient in regards to recommendations from Dr. Radford Pax to have a coronary CTA scan done. Patient verbalized understanding. Instructions have been reviewed and sent through [place].

## 2021-07-26 NOTE — Addendum Note (Signed)
Addended by: Antonieta Iba on: 07/26/2021 03:36 PM   Modules accepted: Orders

## 2021-07-26 NOTE — Telephone Encounter (Signed)
Patty Salas called for a refill on her hydrocodone. She reports it was last filled 06/28/21.

## 2021-07-27 MED ORDER — HYDROCODONE-ACETAMINOPHEN 5-325 MG PO TABS: 1.0000 | ORAL_TABLET | Freq: Three times a day (TID) | ORAL | 0 refills | Status: DC | PRN

## 2021-07-27 NOTE — Telephone Encounter (Signed)
PMP was Reviewed.  Hydrocodone e-scribed.  Patty Salas has an appointment with Dr Naaman Plummer on 08/03/2021

## 2021-07-29 ENCOUNTER — Telehealth: Payer: Self-pay | Admitting: Cardiology

## 2021-07-29 NOTE — Telephone Encounter (Signed)
Follow Up:     Patient says she is returning her call from Wednesday.

## 2021-07-29 NOTE — Telephone Encounter (Signed)
Spoke to the patient and scheduled her for lab work prior to her CT scan.

## 2021-08-01 ENCOUNTER — Ambulatory Visit: Payer: Medicare Other | Admitting: Physician Assistant

## 2021-08-02 ENCOUNTER — Telehealth: Payer: Self-pay | Admitting: Cardiology

## 2021-08-02 NOTE — Telephone Encounter (Signed)
Patient calling to to give Carly a message. She states she is having to cancel her lab work for her CT and rescheduled the lab for 11/4. She states she has a migraine, palsy on the left side of her face, and is very dehydrated. She states she has a blister on her lip as well. She says she will be getting fluids done and knows how to handle getting rehydrated. She says she also had to cancel her appt with her PCP. She says she does not need a call back.

## 2021-08-03 ENCOUNTER — Encounter: Payer: Medicare Other | Admitting: Physical Medicine & Rehabilitation

## 2021-08-03 ENCOUNTER — Other Ambulatory Visit: Payer: Medicare Other

## 2021-08-05 ENCOUNTER — Other Ambulatory Visit: Payer: Medicare Other

## 2021-08-08 ENCOUNTER — Other Ambulatory Visit: Payer: Medicare Other

## 2021-08-09 ENCOUNTER — Other Ambulatory Visit: Payer: Medicare Other

## 2021-08-11 ENCOUNTER — Other Ambulatory Visit: Payer: Self-pay

## 2021-08-11 ENCOUNTER — Ambulatory Visit (HOSPITAL_COMMUNITY): Payer: Medicare Other

## 2021-08-11 ENCOUNTER — Other Ambulatory Visit: Payer: Medicare Other | Admitting: *Deleted

## 2021-08-11 DIAGNOSIS — R072 Precordial pain: Secondary | ICD-10-CM

## 2021-08-12 LAB — BASIC METABOLIC PANEL
BUN/Creatinine Ratio: 19 (ref 12–28)
BUN: 15 mg/dL (ref 8–27)
CO2: 26 mmol/L (ref 20–29)
Calcium: 9.7 mg/dL (ref 8.7–10.3)
Chloride: 100 mmol/L (ref 96–106)
Creatinine, Ser: 0.81 mg/dL (ref 0.57–1.00)
Glucose: 96 mg/dL (ref 70–99)
Potassium: 4.2 mmol/L (ref 3.5–5.2)
Sodium: 143 mmol/L (ref 134–144)
eGFR: 80 mL/min/{1.73_m2} (ref >59)

## 2021-08-15 ENCOUNTER — Telehealth (HOSPITAL_COMMUNITY): Payer: Self-pay | Admitting: Emergency Medicine

## 2021-08-15 ENCOUNTER — Telehealth: Payer: Self-pay | Admitting: Nurse Practitioner

## 2021-08-15 NOTE — Telephone Encounter (Signed)
Reaching out to patient to offer assistance regarding upcoming cardiac imaging study; pt verbalizes understanding of appt date/time, parking situation and where to check in, pre-test NPO status and medications ordered, and verified current allergies; name and call back number provided for further questions should they arise Patty Bond RN Navigator Cardiac Imaging [facility] [phone] office [phone] cell  Pt states shes a difficult IV  Has 59 allergies including iohexol- states she understands the 13 hr prep instructions.

## 2021-08-15 NOTE — Telephone Encounter (Signed)
Attempted to call patient regarding upcoming cardiac CT appointment. °Left message on voicemail with name and callback number °Emmit Oriley RN Navigator Cardiac Imaging °[facility] °[phone] Office °[phone] Cell ° °

## 2021-08-16 ENCOUNTER — Encounter: Payer: Self-pay | Admitting: Cardiology

## 2021-08-16 ENCOUNTER — Other Ambulatory Visit: Payer: Self-pay

## 2021-08-16 ENCOUNTER — Ambulatory Visit (HOSPITAL_COMMUNITY)
Admission: RE | Admit: 2021-08-16 | Discharge: 2021-08-16 | Disposition: A | Discharge status: Home / Self Care | Payer: Medicare Other | Source: Ambulatory Visit | Attending: Cardiology | Admitting: Cardiology

## 2021-08-16 DIAGNOSIS — I25811 Atherosclerosis of native coronary artery of transplanted heart without angina pectoris: Secondary | ICD-10-CM | POA: Insufficient documentation

## 2021-08-16 DIAGNOSIS — R072 Precordial pain: Secondary | ICD-10-CM | POA: Insufficient documentation

## 2021-08-16 MED ORDER — METOPROLOL TARTRATE 5 MG/5ML IV SOLN: 10.0000 mg | INTRAVENOUS | Status: DC | PRN

## 2021-08-16 MED ORDER — NITROGLYCERIN 0.4 MG SL SUBL: 0.8000 mg | SUBLINGUAL_TABLET | Freq: Once | SUBLINGUAL | Status: DC

## 2021-08-16 MED ORDER — METOPROLOL TARTRATE 5 MG/5ML IV SOLN
INTRAVENOUS | Status: AC
  Administered 2021-08-16: 10 mg via INTRAVENOUS
  Filled 2021-08-16: qty 20

## 2021-08-16 MED ORDER — DILTIAZEM HCL 25 MG/5ML IV SOLN
10.0000 mg | INTRAVENOUS | Status: DC | PRN
  Administered 2021-08-16: 10 mg via INTRAVENOUS

## 2021-08-16 MED ORDER — NITROGLYCERIN 0.4 MG SL SUBL
SUBLINGUAL_TABLET | SUBLINGUAL | Status: AC
  Filled 2021-08-16: qty 2

## 2021-08-16 MED ORDER — DILTIAZEM HCL 25 MG/5ML IV SOLN
INTRAVENOUS | Status: AC
  Administered 2021-08-16: 10 mg via INTRAVENOUS
  Filled 2021-08-16: qty 5

## 2021-08-16 MED ORDER — IOHEXOL 350 MG/ML SOLN
100.0000 mL | Freq: Once | INTRAVENOUS | Status: AC | PRN
  Administered 2021-08-16: 100 mL via INTRAVENOUS

## 2021-08-17 ENCOUNTER — Telehealth: Payer: Self-pay

## 2021-08-17 DIAGNOSIS — R072 Precordial pain: Secondary | ICD-10-CM

## 2021-08-17 DIAGNOSIS — I471 Supraventricular tachycardia: Secondary | ICD-10-CM

## 2021-08-17 DIAGNOSIS — I10 Essential (primary) hypertension: Secondary | ICD-10-CM

## 2021-08-17 NOTE — Telephone Encounter (Signed)
The patient has been notified of the result and verbalized understanding.  All questions (if any) were answered. Antonieta Iba, RN 08/17/2021 8:49 AM   Patient states that she cannot tolerate ASA.

## 2021-08-17 NOTE — Telephone Encounter (Signed)
-----   Message from Sueanne Margarita, MD sent at 08/16/2021  3:20 PM EST ----- Please get a copy of last fasting lipid panel from PCP.  Have her start aspirin 81 mg daily.

## 2021-08-18 ENCOUNTER — Other Ambulatory Visit: Payer: Medicare Other

## 2021-08-18 ENCOUNTER — Other Ambulatory Visit: Payer: Self-pay

## 2021-08-18 DIAGNOSIS — R072 Precordial pain: Secondary | ICD-10-CM

## 2021-08-18 DIAGNOSIS — I10 Essential (primary) hypertension: Secondary | ICD-10-CM

## 2021-08-18 DIAGNOSIS — I471 Supraventricular tachycardia: Secondary | ICD-10-CM

## 2021-08-18 LAB — LIPID PANEL
Chol/HDL Ratio: 4.6 ratio — ABNORMAL HIGH (ref 0.0–4.4)
Cholesterol, Total: 264 mg/dL — ABNORMAL HIGH (ref 100–199)
HDL: 58 mg/dL (ref >39)
LDL Chol Calc (NIH): 159 mg/dL — ABNORMAL HIGH (ref 0–99)
Triglycerides: 255 mg/dL — ABNORMAL HIGH (ref 0–149)
VLDL Cholesterol Cal: 47 mg/dL — ABNORMAL HIGH (ref 5–40)

## 2021-08-18 LAB — ALT: ALT: 31 IU/L (ref 0–32)

## 2021-08-18 NOTE — Telephone Encounter (Signed)
Patient will be in the office today for lab, she will try to speak with [facility] then.

## 2021-08-18 NOTE — Telephone Encounter (Signed)
Spoke with the patient who states that she would like to know if she needs a prescription for nitroglycerin. She states that she has not really had any further chest pain but she is concerned if pain comes back. She states that she would feel more comfortable having this on hand. Patient states that she will talk with Dr. Radford Pax about this at her office visit on 12/1.

## 2021-08-19 ENCOUNTER — Encounter: Payer: Self-pay | Admitting: Registered Nurse

## 2021-08-19 ENCOUNTER — Other Ambulatory Visit: Payer: Self-pay

## 2021-08-19 ENCOUNTER — Encounter: Payer: Medicare Other | Attending: Physical Medicine & Rehabilitation | Admitting: Registered Nurse

## 2021-08-19 VITALS — BP 142/81 | HR 102 | Temp 99.5°F | Ht 65.0 in | Wt 153.0 lb

## 2021-08-19 DIAGNOSIS — Z79891 Long term (current) use of opiate analgesic: Secondary | ICD-10-CM | POA: Diagnosis present

## 2021-08-19 DIAGNOSIS — I25758 Atherosclerosis of native coronary artery of transplanted heart with other forms of angina pectoris: Secondary | ICD-10-CM

## 2021-08-19 DIAGNOSIS — M546 Pain in thoracic spine: Secondary | ICD-10-CM | POA: Insufficient documentation

## 2021-08-19 DIAGNOSIS — M47812 Spondylosis without myelopathy or radiculopathy, cervical region: Secondary | ICD-10-CM | POA: Diagnosis present

## 2021-08-19 DIAGNOSIS — M542 Cervicalgia: Secondary | ICD-10-CM | POA: Diagnosis present

## 2021-08-19 DIAGNOSIS — G894 Chronic pain syndrome: Secondary | ICD-10-CM | POA: Diagnosis present

## 2021-08-19 DIAGNOSIS — M797 Fibromyalgia: Secondary | ICD-10-CM | POA: Insufficient documentation

## 2021-08-19 DIAGNOSIS — G8929 Other chronic pain: Secondary | ICD-10-CM | POA: Diagnosis present

## 2021-08-19 DIAGNOSIS — M7062 Trochanteric bursitis, left hip: Secondary | ICD-10-CM | POA: Insufficient documentation

## 2021-08-19 DIAGNOSIS — M5416 Radiculopathy, lumbar region: Secondary | ICD-10-CM | POA: Insufficient documentation

## 2021-08-19 DIAGNOSIS — M5412 Radiculopathy, cervical region: Secondary | ICD-10-CM | POA: Diagnosis present

## 2021-08-19 DIAGNOSIS — Z5181 Encounter for therapeutic drug level monitoring: Secondary | ICD-10-CM | POA: Diagnosis present

## 2021-08-19 DIAGNOSIS — M7061 Trochanteric bursitis, right hip: Secondary | ICD-10-CM | POA: Insufficient documentation

## 2021-08-19 MED ORDER — HYDROCODONE-ACETAMINOPHEN 5-325 MG PO TABS: 1.0000 | ORAL_TABLET | Freq: Three times a day (TID) | ORAL | 0 refills | Status: DC | PRN

## 2021-08-19 NOTE — Progress Notes (Signed)
Subjective:    Patient ID: Patty Salas, female    DOB: [birth year], 65 y.o.   MRN: [id number]  HPI: Patty Salas is a 65 y.o. female who returns for follow up appointment for chronic pain and medication refill. She states her  pain is located in her neck radiating into her bilateral shoulders, mid- lower back pain radiating into her bilateral hips and bilateral lower extremities. She rates her pain 10. Her current exercise regime is walking and performing stretching exercises.  Ms. Igoe Morphine equivalent is 15.38 MME.She is also prescribed Lorazepam  by Dr. Reece Levy .We have discussed the black box warning of using opioids and benzodiazepines. I highlighted the dangers of using these drugs together and discussed the adverse events including respiratory suppression, overdose, cognitive impairment and importance of compliance with current regimen. We will continue to monitor and adjust as indicated.  she is being closely monitored and under the care of her psychiatrist.   Last UDS was Performed on 06/13/2021, it was consistent.      Pain Inventory Average Pain 8 Pain Right Now 10 My pain is constant, sharp, burning, stabbing, tingling, and aching  In the last 24 hours, has pain interfered with the following? General activity 8 Relation with others 9 Enjoyment of life 9 What TIME of day is your pain at its worst? morning , daytime, evening, and night Sleep (in general) Fair  Pain is worse with: walking, bending, sitting, inactivity, standing, and some activites Pain improves with: rest, heat/ice, therapy/exercise, medication, and injections Relief from Meds: 6  Family History  Problem Relation Age of Onset   Cancer Mother        stomach   Migraines Mother    Arthritis Mother    Emphysema Mother    Heart disease Father    Hypertension Father    Cancer Father        colon   Dementia Father    Hypertension Brother    Migraines Brother    Hypertension Brother    Migraines  Brother    Alcohol abuse Brother    Bipolar disorder Brother    Migraines Daughter    Arthritis Maternal Grandmother    Cancer Maternal Grandmother        stomach   Diabetes Maternal Grandmother    Stroke Maternal Grandmother    Cancer Maternal Aunt        lung   Emphysema Maternal Aunt    Cancer Paternal Uncle        colon   Hypertension Maternal Aunt    Heart disease Maternal Aunt    Cancer Paternal Uncle        lung   Social History   Socioeconomic History   Marital status: Married    Spouse name: Not on file   Number of children: 1   Years of education: COLLEGE1   Highest education level: Not on file  Occupational History   Occupation: HOUSEWIFE    Employer: UNEMPLOYED  Tobacco Use   Smoking status: Never   Smokeless tobacco: Never  Vaping Use   Vaping Use: Never used  Substance and Sexual Activity   Alcohol use: No   Drug use: No   Sexual activity: Not on file  Other Topics Concern   Not on file  Social History Narrative   Patient is right handed.   Patient drinks caffeine occasionally.   Social Determinants of Health   Financial Resource Strain: Not on file  Food Insecurity: Not on  file  Transportation Needs: Not on file  Physical Activity: Not on file  Stress: Not on file  Social Connections: Not on file   Past Surgical History:  Procedure Laterality Date    kidney stones     [place]   partial- hemmoraged after surgery-stitch came loose   APPENDECTOMY  1993   hemmoraged after surgery- stitch came loose   CERVICAL DISC SURGERY  06/04/2001   with fusion   [place]   colonoscopy     COLONOSCOPY     CYSTOSCOPY     FOOT SURGERY     lt   INCISIONAL HERNIA REPAIR N/A 04/22/2021   Procedure: LAPAROSCOPIC INCISIONAL HERNIA REPAIR WITH MESH;  Surgeon: Clovis Riley, MD;  Location: [facility];  Service: General;  Laterality: N/A;   jj stent  07/2002   with ureteroscopy, cystoscopy and  then removal of stent in office   LAPAROSCOPIC LYSIS OF ADHESIONS N/A 04/22/2021   Procedure: LYSIS OF ADHESIONS;  Surgeon: Clovis Riley, MD;  Location: [facility];  Service: General;  Laterality: N/A;   LITHOTRIPSY     LYMPH NODE BIOPSY Right 03/06/2014   Procedure: right groin LYMPH NODE BIOPSY;  Surgeon: Merrie Roof, MD;  Location: [facility];  Service: General;  Laterality: Right;   LYMPHADENECTOMY N/A 12/29/2015   Procedure: RETROPERITONEAL LYMPHADENECTOMY;  Surgeon: Alexis Frock, MD;  Location: [facility];  Service: Urology;  Laterality: N/A;   NECK SURGERY  2002   ROBOT ASSISTED LAPAROSCOPIC NEPHRECTOMY Left 12/29/2015   Procedure: XI ROBOTIC ASSISTED LEFT LAPAROSCOPIC NEPHRECTOMY;  Surgeon: Alexis Frock, MD;  Location: [facility];  Service: Urology;  Laterality: Left;   TONSILLECTOMY  1976   Past Surgical History:  Procedure Laterality Date    kidney stones     ABDOMINAL ADHESION SURGERY     ABDOMINAL HYSTERECTOMY  1995   partial- hemmoraged after surgery-stitch came loose   APPENDECTOMY  1993   hemmoraged after surgery- stitch came loose   CERVICAL DISC SURGERY  06/04/2001   with fusion   [place]   colonoscopy     COLONOSCOPY     CYSTOSCOPY     FOOT SURGERY     lt   INCISIONAL HERNIA REPAIR N/A 04/22/2021   Procedure: LAPAROSCOPIC INCISIONAL HERNIA REPAIR WITH MESH;  Surgeon: Clovis Riley, MD;  Location: [facility];  Service: General;  Laterality: N/A;   jj stent  07/2002   with ureteroscopy, cystoscopy and then removal of stent in office   LAPAROSCOPIC LYSIS OF ADHESIONS N/A 04/22/2021   Procedure: LYSIS OF ADHESIONS;  Surgeon: Clovis Riley, MD;  Location: [facility];  Service: General;  Laterality: N/A;   LITHOTRIPSY     LYMPH NODE BIOPSY Right 03/06/2014   Procedure: right groin LYMPH NODE BIOPSY;  Surgeon: Merrie Roof, MD;  Location: [facility];  Service: General;  Laterality: Right;   LYMPHADENECTOMY N/A 12/29/2015    Procedure: RETROPERITONEAL LYMPHADENECTOMY;  Surgeon: Alexis Frock, MD;  Location: [facility];  Service: Urology;  Laterality: N/A;   NECK SURGERY  2002   ROBOT ASSISTED LAPAROSCOPIC NEPHRECTOMY Left 12/29/2015   Procedure: XI ROBOTIC ASSISTED LEFT LAPAROSCOPIC NEPHRECTOMY;  Surgeon: Alexis Frock, MD;  Location: [facility];  Service: Urology;  Laterality: Left;   TONSILLECTOMY  1976   Past Medical History:  Diagnosis Date   Anxiety    Arthritis    Asthma  Bipolar 2 disorder ([place])    pt stated, "I have Bipolar 2 and it is remission"   Bipolar affective ([place])    CAD (coronary artery disease), native artery transplanted heart    Nonobstructive by coronary CTA 08/2021 with less than 25% stenosis in the LAD, RCA and left circumflex.   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   Full dentures    Gout    Headache(784.0)    migraines   Herpesviral infection    Hypertension    IBS (irritable bowel syndrome)    with diarrhea   Lymphadenopathy    Myalgia and myositis, unspecified    PAT (paroxysmal atrial tachycardia) (HCC)    asymptomatic 10 beat run on event monitor 09/2020   PFO (patent foramen ovale)    Small PFO noted on coronary CTA   Pneumonia    PONV (postoperative nausea and vomiting)    PTSD (post-traumatic stress disorder)    Renal calculi    Restless legs syndrome (RLS)    Thoracic radiculopathy    Vitamin D deficiency    Wears glasses    Ht 5\' 5"  (1.651 m)   Wt 153 lb (69.4 kg)   BMI 25.46 kg/m   Opioid Risk Score:   Fall Risk Score:  `1  Depression screen PHQ 2/9  Depression screen [facility] 2/9 06/28/2021 04/26/2021 03/01/2021 10/05/2020 04/09/2020 02/04/2020 10/06/2019  Decreased Interest 1 0 0 1 1 1 1   Down, Depressed, Hopeless 1 0 0 - 1 1 1    PHQ - 2 Score 2 0 0 1 2 2 2   Altered sleeping - - - - - - -  Tired, decreased energy - - - - - - -  Change in appetite - - - - - - -  Feeling bad or failure about yourself  - - - - - - -  Trouble concentrating - - - - - - -  Moving slowly or fidgety/restless - - - - - - -  Suicidal thoughts - - - - - - -  PHQ-9 Score - - - - - - -  Difficult doing work/chores - - - - - - -  Some recent data might be hidden    Review of Systems  Musculoskeletal:  Positive for arthralgias, back pain and gait problem.       Pain in the shoulders, hips,knees, hands  Neurological:  Positive for headaches.  All other systems reviewed and are negative.     Objective:   Physical Exam Vitals and nursing note reviewed.  Constitutional:      Appearance: Normal appearance.  Cardiovascular:     Rate and Rhythm: Normal rate and regular rhythm.     Pulses: Normal pulses.     Heart sounds: Normal heart sounds.  Pulmonary:     Effort: Pulmonary effort is normal.     Breath sounds: Normal breath sounds.  Musculoskeletal:     Cervical back: Normal range of motion and neck supple.     Comments: Normal Muscle Bulk and Muscle Testing Reveals:  Upper Extremities: Full ROM and Muscle Strength 5/5 Bilateral AC Joint Tenderness  Thoracic and Lumbar Hypersensitivity Bilateral Greater Trochanter Tenderness Lower Extremities: Full ROM and Muscle  Strength 5/5 Arises from Table Slowly using cane for support Narrow Based  Gait     Skin:    General: Skin is warm and dry.  Neurological:     Mental Status: She is alert and oriented to person, place, and time.  Psychiatric:        Mood and Affect: Mood normal.        Behavior: Behavior normal.         Assessment & Plan:  1 History of fibromyalgia with myofascial pain and multiple trigger points.Continue with Heat and exercise Regime. Continue with current medication regimen with  gabapentin. 08/19/2021 2 Chronic migraine headaches.Continue to Monitor. S/P  Botox.on 05/30/2017. 08/19/2021 3. Midline Low Back Pain/ Lumbar Spondylosis/Lumbar degenerative disk disease, L4-5/ Lumbar Radiculitis: Continue with HEP and current medication regimen. Continue Hydrocodone 5/325 mg one tablet every 8 hours as needed #80. Marland KitchenContinue with slow weaning of Hydrocodone. Second script sent for the following month to accommodate scheduled appointment . 08/19/2021 4. 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 Urology Following. 08/19/2021. 5. 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 08/19/2021 6. Cervicalgia/ Cervical RadiculitisCervical Spondylosis:  Continue current medication regiment with  Gabapentin. Continue  with HEP and Continue to monitor. 08/19/2021 7. Muscle Spasm: Continue current medication regimen with Flexeril as needed. 08/19/2021 8. Bilateral Greater Trochanter Bursitis: Continue to alternate with ice and heat therapy. Continue HEP as Tolerated. Continue to monitor. 08/19/2021 9. Bilateral Knee Pain: No complaints today. S/P Knee Injection by Dr Sharol Given on 02/23/2020. Orthopedics Following. Continue to Monitor. 08/19/2021   F/U in 2 month

## 2021-08-19 NOTE — Progress Notes (Signed)
Will see patient in office 09/06/2021.

## 2021-08-22 ENCOUNTER — Telehealth: Payer: Self-pay

## 2021-08-22 DIAGNOSIS — I25758 Atherosclerosis of native coronary artery of transplanted heart with other forms of angina pectoris: Secondary | ICD-10-CM

## 2021-08-22 NOTE — Telephone Encounter (Signed)
Referral has been placed. 

## 2021-08-22 NOTE — Telephone Encounter (Signed)
-----   Message from Sueanne Margarita, MD sent at 08/19/2021 10:48 AM EST ----- Patient has coronary Ca so needs to be on statin but has allergy - please refer to lipid clinic

## 2021-08-24 ENCOUNTER — Other Ambulatory Visit: Payer: Self-pay | Admitting: Neurology

## 2021-08-31 ENCOUNTER — Telehealth: Payer: Self-pay | Admitting: Cardiology

## 2021-08-31 NOTE — Telephone Encounter (Signed)
New Message:     Patient wants Percival Spanish to know she have to cancel tomorrow appointment. She is having Migraines,, Vertigo and Palsy.

## 2021-09-01 ENCOUNTER — Ambulatory Visit: Payer: Medicare Other | Admitting: Cardiology

## 2021-09-06 ENCOUNTER — Other Ambulatory Visit: Payer: Self-pay

## 2021-09-06 ENCOUNTER — Encounter: Payer: Self-pay | Admitting: Nurse Practitioner

## 2021-09-06 ENCOUNTER — Ambulatory Visit (INDEPENDENT_AMBULATORY_CARE_PROVIDER_SITE_OTHER): Payer: Medicare Other | Admitting: Nurse Practitioner

## 2021-09-06 VITALS — BP 127/81 | HR 101 | Ht 65.0 in | Wt 153.0 lb

## 2021-09-06 DIAGNOSIS — Z1231 Encounter for screening mammogram for malignant neoplasm of breast: Secondary | ICD-10-CM

## 2021-09-06 DIAGNOSIS — M5136 Other intervertebral disc degeneration, lumbar region: Secondary | ICD-10-CM

## 2021-09-06 DIAGNOSIS — I25811 Atherosclerosis of native coronary artery of transplanted heart without angina pectoris: Secondary | ICD-10-CM | POA: Diagnosis not present

## 2021-09-06 DIAGNOSIS — G43519 Persistent migraine aura without cerebral infarction, intractable, without status migrainosus: Secondary | ICD-10-CM | POA: Diagnosis not present

## 2021-09-06 DIAGNOSIS — Z0000 Encounter for general adult medical examination without abnormal findings: Secondary | ICD-10-CM

## 2021-09-06 DIAGNOSIS — I10 Essential (primary) hypertension: Secondary | ICD-10-CM | POA: Diagnosis not present

## 2021-09-06 DIAGNOSIS — M51369 Other intervertebral disc degeneration, lumbar region without mention of lumbar back pain or lower extremity pain: Secondary | ICD-10-CM

## 2021-09-06 DIAGNOSIS — F418 Other specified anxiety disorders: Secondary | ICD-10-CM

## 2021-09-06 NOTE — Progress Notes (Signed)
Subjective:   Patty Salas is a 65 y.o. female who presents for Medicare Annual (Subsequent) preventive examination. She is having migraine headache. This has been going on for a few days. Sometimes develop palsy related to the migraine headache. Sees neurology for this. She feels that migraine medication is helpful and adequate. States that she will not take migraine medication more than twice weekly as she doesn't want to get rebound headaches. She also sees pain specialist. She has been through two separate types of cancer and several surgeries. Has had some residual pain since then.  Is going to see specialist at the lipid clinic per recommendation from cardiology.  Had coronary CT scan in November She does have history of renal carcinoma.  .   Review of Systems    Review of Systems  Constitutional:  Negative for fever, malaise/fatigue and weight loss.  HENT:  Negative for congestion, ear discharge, ear pain, hearing loss and sore throat.   Eyes: Negative.   Respiratory:  Negative for cough, shortness of breath and wheezing.   Cardiovascular:  Positive for claudication. Negative for chest pain and orthopnea.       History of CAD and severely elevated cholesterol. Unable to take statins due to allergies.   Gastrointestinal:  Negative for abdominal pain, blood in stool, constipation, diarrhea, nausea and vomiting.  Genitourinary:  Negative for dysuria, flank pain, frequency and urgency.  Musculoskeletal:  Positive for joint pain and myalgias. Negative for back pain.       The patient sees pain center to help with pain medication and treatment of chronic pain. She tries to use homeopathic treatments for pain if at all possible. Now gets injections into the spine to help with pain management. She does take norco 5/325mg  tablets and takes on as needed and is gradually weaning herself off of this.   Skin:  Negative for itching and rash.  Neurological:  Positive for headaches. Negative for  dizziness and weakness.       Severe migraine headaches. Sees neurology for control of migraine headaches   Endo/Heme/Allergies: Negative.   Psychiatric/Behavioral:  Negative for depression.        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 Would like psychiatrist to wean her off of lorazepam.          Objective:    The patient is alert and oriented. She is pleasant and answers all questions appropriately. Breathing is non-labored. She is in no acute distress at this time.    Today's Vitals   09/06/21 1035  BP: 127/81  Pulse: (!) 101  Weight: 153 lb (69.4 kg)  Height: 5\' 5"  (1.651 m)   Body mass index is 25.46 kg/m.  Advanced Directives 04/22/2021 04/21/2021 12/16/2020 12/16/2020 05/30/2017 04/24/2017 01/17/2017  Does Patient Have a Medical Advance Directive? Yes Yes No Yes No No No  Type of Advance Directive Living will;Healthcare Power of Attorney Living will;Healthcare Power of Attorney - - - - -  Does patient want to make changes to medical advance directive? No - Patient declined - No - Patient declined - - - -  Copy of [place] in Chart? No - copy requested - - - - - -  Would patient like information on creating a medical advance directive? - - No - Patient declined Yes (ED - Information included in AVS) - Yes (MAU/Ambulatory/Procedural Areas - Information given) Yes (MAU/Ambulatory/Procedural Areas - Information given)    Current Medications (verified)  Outpatient Encounter Medications as of 09/06/2021  Medication Sig   acetaminophen (TYLENOL) 500 MG tablet Take 500 mg by mouth every 6 (six) hours as needed for moderate pain.   albuterol (VENTOLIN HFA) 108 (90 Base) MCG/ACT inhaler Inhale 2 puffs into the lungs every 6 (six) hours as needed for wheezing.   almotriptan (AXERT) 12.5 MG tablet Take 1 tablet (12.5 mg total) by mouth as needed for migraine. TAKE 1 TABLET BY MOUTH AS NEEDED FOR MIGRAINE (MAX 2 TABS PER WEEK).  THIS IS 90-DAY RX. PAYS CASH   bisacodyl (DULCOLAX) 5 MG EC tablet Take 10 mg by mouth daily as needed for mild constipation.    cyclobenzaprine (FLEXERIL) 10 MG tablet Take 1 tablet (10 mg total) by mouth 3 (three) times daily.   docusate sodium (COLACE) 100 MG capsule Take 100 mg by mouth daily as needed for mild constipation.    EPINEPHrine 0.3 mg/0.3 mL IJ SOAJ injection Inject 0.3 mg into the muscle as needed for anaphylaxis.   fexofenadine (ALLEGRA) 180 MG tablet Take 1 tablet (180 mg total) by mouth daily.   furosemide (LASIX) 20 MG tablet TAKE 1 TABLET (20 MG TOTAL) BY MOUTH AS NEEDED FOR EDEMA.   gabapentin (NEURONTIN) 300 MG capsule Take 600 mg by mouth at bedtime.   HYDROcodone-acetaminophen (NORCO/VICODIN) 5-325 MG tablet Take 1 tablet by mouth every 8 (eight) hours as needed for moderate pain. Do Not Fill Before 08/26/2021   hyoscyamine (ANASPAZ) 0.125 MG TBDP disintergrating tablet hyoscyamine 0.125 mg disintegrating tablet  TAKE 1 TABLET BY MOUTH 4 TIMES DAILY AS NEEDED FOR UP TO 60 DOSES (CRAMPING, PAIN)   Loperamide HCl (IMODIUM A-D PO)    LORazepam (ATIVAN) 1 MG tablet Take 1 mg by mouth 3 (three) times daily.   promethazine (PHENERGAN) 25 MG tablet TAKE 0.5 TABLETS (12.5 MG TOTAL) BY MOUTH EVERY 6 (SIX) HOURS AS NEEDED FOR NAUSEA OR VOMITING.   Trospium Chloride 60 MG CP24 Take 1 capsule by mouth every morning.    metoprolol tartrate (LOPRESSOR) 100 MG tablet Take 1 tablet (100 mg total) by mouth once for 1 dose. Take 1 tablet (100 mg total) two hour prior to CT scan.   predniSONE (DELTASONE) 50 MG tablet Take 1 tablet 13 hours prior to test. Take 1 tablet 7 hours prior to test. Take 1 tablet 1 hour prior to test. (Patient not taking: Reported on 09/06/2021)   No facility-administered encounter medications on file as of 09/06/2021.    Allergies (verified) Aripiprazole, Cymbalta [duloxetine hcl], Divalproex sodium, Duloxetine, Duract [bromfenac], Hydrochlorothiazide, Imitrex  [sumatriptan succinate], Latex, Mellaril, Olanzapine, Penicillins, Quetiapine, Statins, Topamax, Topiramate, Aripiprazole, Aspirin, Dalmane [flurazepam hcl], Darifenacin hydrobromide er, Metoclopramide hcl, Seroquel [quetiapine fumerate], Stelazine, Thorazine [chlorpromazine hcl], Alka-seltzer [aspirin effervescent], Allopurinol, Avelox [moxifloxacin], Biofreeze [menthol (topical analgesic)], Capzasin [capsaicin], Clindamycin/lincomycin, Clove oil, Colchicine, Cough drops [benzocaine], Cycloheximide, Darifenacin, Diatrizoate, Diflucan [fluconazole], Garlic, Iohexol, Ivp dye [iodinated diagnostic agents], Lyrica [pregabalin], Metamucil [psyllium], Miralax [polyethylene glycol], Myrbetriq [mirabegron], Nsaids, Other, Oxybutynin, Potassium-containing compounds, Prednisone, Red dye, Reglan [metoclopramide], Risperdal [risperidone], Seroquel [quetiapine fumarate], Simvastatin, Urocit - k [potassium citrate], Zyprexa [olanzapine], Avelox [moxifloxacin hcl in nacl], Barium-containing compounds, Diclofenac, Doxycycline, E-mycin [erythromycin base], Fiorinal [butalbital-aspirin-caffeine], Flagyl [metronidazole hcl], Lamictal [lamotrigine], Pregabalin, Risperidone, and Toradol [ketorolac tromethamine]   History: Past Medical History:  Diagnosis Date   Anxiety    Arthritis    Asthma    Bipolar 2 disorder (HCC)    pt stated, "I have Bipolar 2 and it is remission"   Bipolar affective ([place])  CAD (coronary artery disease), native artery transplanted heart    Nonobstructive by coronary CTA 08/2021 with less than 25% stenosis in the LAD, RCA and left circumflex.   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   Full dentures    Gout    Headache(784.0)    migraines   Herpesviral  infection    Hypertension    IBS (irritable bowel syndrome)    with diarrhea   Lymphadenopathy    Myalgia and myositis, unspecified    PAT (paroxysmal atrial tachycardia) (HCC)    asymptomatic 10 beat run on event monitor 09/2020   PFO (patent foramen ovale)    Small PFO noted on coronary CTA   Pneumonia    PONV (postoperative nausea and vomiting)    PTSD (post-traumatic stress disorder)    Renal calculi    Restless legs syndrome (RLS)    Thoracic radiculopathy    Vitamin D deficiency    Wears glasses    Past Surgical History:  Procedure Laterality Date    kidney stones     ABDOMINAL ADHESION SURGERY     ABDOMINAL HYSTERECTOMY  1995   partial- hemmoraged after surgery-stitch came loose   APPENDECTOMY  1993   hemmoraged after surgery- stitch came loose   CERVICAL DISC SURGERY  06/04/2001   with fusion   CHOLECYSTECTOMY  1985   colonoscopy     COLONOSCOPY     CYSTOSCOPY     FOOT SURGERY     lt   INCISIONAL HERNIA REPAIR N/A 04/22/2021   Procedure: LAPAROSCOPIC INCISIONAL HERNIA REPAIR WITH MESH;  Surgeon: Clovis Riley, MD;  Location: [facility];  Service: General;  Laterality: N/A;   jj stent  07/2002   with ureteroscopy, cystoscopy and then removal of stent in office   LAPAROSCOPIC LYSIS OF ADHESIONS N/A 04/22/2021   Procedure: LYSIS OF ADHESIONS;  Surgeon: Clovis Riley, MD;  Location: [facility];  Service: General;  Laterality: N/A;   LITHOTRIPSY     LYMPH NODE BIOPSY Right 03/06/2014   Procedure: right groin LYMPH NODE BIOPSY;  Surgeon: Merrie Roof, MD;  Location: [facility];  Service: General;  Laterality: Right;   LYMPHADENECTOMY N/A 12/29/2015   Procedure: RETROPERITONEAL LYMPHADENECTOMY;  Surgeon: Alexis Frock, MD;  Location: [facility];  Service: Urology;  Laterality: N/A;   NECK SURGERY  2002   ROBOT ASSISTED LAPAROSCOPIC NEPHRECTOMY Left 12/29/2015   Procedure: XI ROBOTIC ASSISTED LEFT LAPAROSCOPIC NEPHRECTOMY;  Surgeon: Alexis Frock, MD;   Location: [facility];  Service: Urology;  Laterality: Left;   TONSILLECTOMY  1976   Family History  Problem Relation Age of Onset   Cancer Mother        stomach   Migraines Mother    Arthritis Mother    Emphysema Mother    Heart disease Father    Hypertension Father    Cancer Father        colon   Dementia Father    Hypertension Brother    Migraines Brother    Hypertension Brother    Migraines Brother    Alcohol abuse Brother    Bipolar disorder Brother    Migraines Daughter    Arthritis Maternal Grandmother  Cancer Maternal Grandmother        stomach   Diabetes Maternal Grandmother    Stroke Maternal Grandmother    Cancer Maternal Aunt        lung   Emphysema Maternal Aunt    Cancer Paternal Uncle        colon   Hypertension Maternal Aunt    Heart disease Maternal Aunt    Cancer Paternal Uncle        lung   Social History   Socioeconomic History   Marital status: Married    Spouse name: Not on file   Number of children: 1   Years of education: COLLEGE1   Highest education level: Not on file  Occupational History   Occupation: HOUSEWIFE    Employer: UNEMPLOYED  Tobacco Use   Smoking status: Never   Smokeless tobacco: Never  Vaping Use   Vaping Use: Never used  Substance and Sexual Activity   Alcohol use: No   Drug use: No   Sexual activity: Not on file  Other Topics Concern   Not on file  Social History Narrative   Patient is right handed.   Patient drinks caffeine occasionally.   Social Determinants of Health   Financial Resource Strain: Not on file  Food Insecurity: Not on file  Transportation Needs: Not on file  Physical Activity: Not on file  Stress: Not on file  Social Connections: Not on file    Tobacco Counseling Counseling given: Not Answered   Diabetic?no   Activities of Daily Living In your present state of health, do you have any difficulty performing the following activities: 09/06/2021 04/22/2021  Hearing? N N  Vision? Y N   Difficulty concentrating or making decisions? N N  Walking or climbing stairs? Y Y  Comment - -  Dressing or bathing? N N  Doing errands, shopping? Y N  Some recent data might be hidden    Patient Care Team: Ronnell Freshwater, NP as PCP - General (Family Medicine) Richmond Campbell, MD as Consulting Physician (Gastroenterology) Festus Aloe, MD as Consulting Physician (Urology) Alexis Frock, MD as Consulting Physician (Urology) Bayard Hugger, NP as Nurse Practitioner (Physical Medicine and Rehabilitation) Meredith Staggers, MD as Consulting Physician (Physical Medicine and Rehabilitation) Kathrynn Ducking, MD as Consulting Physician (Neurology) Regal, Tamala Fothergill, DPM as Consulting Physician (Podiatry) Ricard Dillon, MD (Psychiatry) Prueter, Gustavo Lah (Physical Medicine and Rehabilitation) Sueanne Margarita, MD as Consulting Physician (Cardiology) Newt Minion, MD as Consulting Physician (Orthopedic Surgery) Calton Dach, MD as Referring Physician (Optometry) Heath Lark, MD as Consulting Physician (Hematology and Oncology)  Indicate any recent Medical Services you may have received from other than [facility] providers in the past year (date may be approximate).     Assessment:  1. Encounter for Medicare annual wellness exam Medicare wellness visit today.   2. Primary hypertension Stable. Continue medications as prescribed and regualr visits with cardiology as scheduled   3. Coronary artery disease involving native artery of transplanted heart without angina pectoris Continue regular visits with cardiology as scheduled. Will be seeing lipid clinic in near future to help control cholesterol as she cannot tolerate statins   4. Migraine aura, persistent, intractable Patient to see neurology as scheduled for chronic migraine headaches   5. Lumbar degenerative disc disease Patient seeing pain management for pain control.   6. Depression with anxiety Patient  sees psychiatry as scheduled. Would like to discuss weaning off lorazepam with Dr. Reece Levy. No medication  changes to be made today  7. Encounter for screening mammogram for malignant neoplasm of breast Screening mammogram ordered today.  - MM DIGITAL SCREENING BILATERAL; Future   Hearing/Vision screen No results found.   Depression Screen PHQ 2/9 Scores 09/06/2021 08/19/2021 06/28/2021 04/26/2021 03/01/2021 11/02/2020 10/05/2020  PHQ - 2 Score 0 2 2 0 0 - 1  PHQ- 9 Score 2 - - - - - -  Exception Documentation - - - - - Patient refusal -  Not completed - - - - - - -    Fall Risk Fall Risk  09/06/2021 08/19/2021 06/28/2021 04/26/2021 03/01/2021  Falls in the past year? 0 0 0 0 0  Comment - - - - -  Number falls in past yr: 0 0 - 0 0  Comment - - - - -  Injury with Fall? 0 0 - 0 0  Risk Factor Category  - - - - -  Risk for fall due to : - - - - -  Risk for fall due to: Comment - - - - -  Follow up Falls evaluation completed - - - -  Comment - - - - -    FALL RISK PREVENTION PERTAINING TO THE HOME:  Any stairs in or around the home? No  If so, are there any without handrails? No  Home free of loose throw rugs in walkways, pet beds, electrical cords, etc? Yes  Adequate lighting in your home to reduce risk of falls? Yes   ASSISTIVE DEVICES UTILIZED TO PREVENT FALLS:  Life alert? No  Use of a cane, walker or w/c? Yes  Grab bars in the bathroom? No  Shower chair or bench in shower? No  Elevated toilet seat or a handicapped toilet? No   TIMED UP AND GO:  Was the test performed? No .  Length of time to ambulate 10 feet: 0 sec.   Telephone visit   Cognitive Function:     6CIT Screen 09/06/2021  What Year? 0 points  What month? 0 points  What time? 0 points  Count back from 20 0 points  Months in reverse 0 points  Repeat phrase 0 points  Total Score 0    Immunizations Immunization History  Administered Date(s) Administered   Influenza Inj Mdck Quad Pf 08/02/2017   Influenza  Inj Mdck Quad With Preservative 08/12/2019   Influenza,inj,Quad PF,6+ Mos 10/07/2018   Pneumococcal Polysaccharide-23 10/12/2006   Tdap 10/12/2006   Unspecified SARS-COV-2 Vaccination 12/23/2019, 12/23/2019, 01/15/2020, 01/15/2020   Zoster, Live 02/16/2015    TDAP status: Up to date  Flu Vaccine status: Due, Education has been provided regarding the importance of this vaccine. Advised may receive this vaccine at local pharmacy or Health Dept. Aware to provide a copy of the vaccination record if obtained from local pharmacy or Health Dept. Verbalized acceptance and understanding.  Pneumococcal vaccine status: Due, Education has been provided regarding the importance of this vaccine. Advised may receive this vaccine at local pharmacy or Health Dept. Aware to provide a copy of the vaccination record if obtained from local pharmacy or Health Dept. Verbalized acceptance and understanding.  Covid-19 vaccine status: Completed vaccines  Qualifies for Shingles Vaccine? Yes   Zostavax completed No   Shingrix Completed?: No.    Education has been provided regarding the importance of this vaccine. Patient has been advised to call insurance company to determine out of pocket expense if they have not yet received this vaccine. Advised may also receive vaccine at local pharmacy  or Health Dept. Verbalized acceptance and understanding.  Screening Tests Health Maintenance  Topic Date Due   Zoster Vaccines- Shingrix (1 of 2) Never done   Pneumonia Vaccine 58+ Years old (2 - PCV) 10/13/2007   MAMMOGRAM  05/20/2009   TETANUS/TDAP  10/12/2016   COVID-19 Vaccine (3 - Mixed Product risk series) 02/12/2020   INFLUENZA VACCINE  05/02/2021   COLONOSCOPY (Pts 45-27yrs Insurance coverage will need to be confirmed)  01/30/2028   DEXA SCAN  Completed   Hepatitis C Screening  Completed   HPV VACCINES  Aged Out    Health Maintenance  Health Maintenance Due  Topic Date Due   Zoster Vaccines- Shingrix (1 of 2)  Never done   Pneumonia Vaccine 52+ Years old (2 - PCV) 10/13/2007   MAMMOGRAM  05/20/2009   TETANUS/TDAP  10/12/2016   COVID-19 Vaccine (3 - Mixed Product risk series) 02/12/2020   INFLUENZA VACCINE  05/02/2021    Colorectal cancer screening: Type of screening: Colonoscopy. Completed  . Repeat every 10 years  Mammogram status: Ordered 09/06/2021. Pt provided with contact info and advised to call to schedule appt.      Lung Cancer Screening: (Low Dose CT Chest recommended if Age 39-80 years, 30 pack-year currently smoking OR have quit w/in 15years.) does not qualify.   Lung Cancer Screening Referral: no   Additional Screening:  Hepatitis C Screening: does not qualify; Completed 01/31/2015 - negative  Vision Screening: Recommended annual ophthalmology exams for early detection of glaucoma and other disorders of the eye. Is the patient up to date with their annual eye exam?  Yes  Who is the provider or what is the name of the office in which the patient attends annual eye exams? Dr. Katy Fitch If pt is not established with a provider, would they like to be referred to a provider to establish care? No .   Dental Screening: Recommended annual dental exams for proper oral hygiene  Community Resource Referral / Chronic Care Management: CRR required this visit?  No   CCM required this visit?  No      Plan:     I have personally reviewed and noted the following in the patient's chart:   Medical and social history Use of alcohol, tobacco or illicit drugs  Current medications and supplements including opioid prescriptions.  Functional ability and status Nutritional status Physical activity Advanced directives List of other physicians Hospitalizations, surgeries, and ER visits in previous 12 months Vitals Screenings to include cognitive, depression, and falls Referrals and appointments  In addition, I have reviewed and discussed with patient certain preventive protocols, quality  metrics, and best practice recommendations. A written personalized care plan for preventive services as well as general preventive health recommendations were provided to patient.     Leretha Pol, FNP-c     09/06/2021

## 2021-09-08 ENCOUNTER — Telehealth: Payer: Self-pay | Admitting: Nurse Practitioner

## 2021-09-08 NOTE — Telephone Encounter (Signed)
Sending as an Pharmacist, hospital....  Patient is scheduled for her Mammogram on 10/14/21 @ 11:30am at the [facility].

## 2021-09-08 NOTE — Telephone Encounter (Signed)
Thank you :)

## 2021-09-13 ENCOUNTER — Other Ambulatory Visit: Payer: Self-pay | Admitting: Psychiatry

## 2021-09-13 ENCOUNTER — Other Ambulatory Visit: Payer: Self-pay | Admitting: Cardiovascular Disease

## 2021-09-13 ENCOUNTER — Other Ambulatory Visit: Payer: Self-pay | Admitting: Physician Assistant

## 2021-09-15 ENCOUNTER — Ambulatory Visit: Payer: Medicare Other

## 2021-09-23 ENCOUNTER — Ambulatory Visit: Payer: Medicare Other

## 2021-09-27 ENCOUNTER — Encounter: Payer: Medicare Other | Admitting: Registered Nurse

## 2021-09-27 ENCOUNTER — Encounter: Payer: Self-pay | Admitting: *Deleted

## 2021-09-27 ENCOUNTER — Telehealth: Payer: Self-pay | Admitting: *Deleted

## 2021-09-27 DIAGNOSIS — M797 Fibromyalgia: Secondary | ICD-10-CM

## 2021-09-27 DIAGNOSIS — G894 Chronic pain syndrome: Secondary | ICD-10-CM

## 2021-09-27 MED ORDER — HYDROCODONE-ACETAMINOPHEN 5-325 MG PO TABS: 1.0000 | ORAL_TABLET | Freq: Three times a day (TID) | ORAL | 0 refills | Status: DC | PRN

## 2021-09-27 NOTE — Telephone Encounter (Signed)
[facility] tried to call but we were closed yesterday. She is sick with flu symptoms and fever. She is requesting #80 on her Norco and has next appt scheduled with [facility]  10/27/21.

## 2021-09-27 NOTE — Telephone Encounter (Signed)
PMP was Reviewed.  Hydrocodone e-scribed today.  

## 2021-09-30 ENCOUNTER — Other Ambulatory Visit: Payer: Self-pay | Admitting: Psychiatry

## 2021-10-05 ENCOUNTER — Ambulatory Visit (INDEPENDENT_AMBULATORY_CARE_PROVIDER_SITE_OTHER): Payer: Medicare Other | Admitting: Nurse Practitioner

## 2021-10-05 ENCOUNTER — Other Ambulatory Visit: Payer: Self-pay

## 2021-10-05 ENCOUNTER — Telehealth: Payer: Self-pay | Admitting: Nurse Practitioner

## 2021-10-05 ENCOUNTER — Encounter: Payer: Self-pay | Admitting: Nurse Practitioner

## 2021-10-05 VITALS — BP 120/73 | HR 100 | Temp 99.8°F | Ht 65.0 in | Wt 154.2 lb

## 2021-10-05 DIAGNOSIS — E559 Vitamin D deficiency, unspecified: Secondary | ICD-10-CM

## 2021-10-05 DIAGNOSIS — J0140 Acute pansinusitis, unspecified: Secondary | ICD-10-CM | POA: Insufficient documentation

## 2021-10-05 MED ORDER — ERGOCALCIFEROL 1.25 MG (50000 UT) PO CAPS: 50000.0000 [IU] | ORAL_CAPSULE | ORAL | 5 refills | Status: DC

## 2021-10-05 MED ORDER — LEVOFLOXACIN 500 MG PO TABS: 500.0000 mg | ORAL_TABLET | Freq: Every day | ORAL | 0 refills | Status: DC

## 2021-10-05 NOTE — Telephone Encounter (Signed)
Patient called to let you know she is having some swelling in her face and under eyes are a red/purple color. [phone]

## 2021-10-05 NOTE — Telephone Encounter (Signed)
Has she already started on antibiotics I prescribed fr her earlier today?

## 2021-10-05 NOTE — Telephone Encounter (Signed)
This is likely from the sinusitis and is one of te symptoms. Will, hopefully, improve after treatment is started.

## 2021-10-05 NOTE — Progress Notes (Signed)
Virtual Visit via Telephone Note  I connected with Patty Salas on 10/05/21 at 10:50 AM EST by telephone and verified that I am speaking with the correct person using two identifiers.  Location: Patient: home Provider: [place] [facility]     I discussed the limitations, risks, security and privacy concerns of performing an evaluation and management service by telephone and the availability of in person appointments. I also discussed with the patient that there may be a patient responsible charge related to this service. The patient expressed understanding and agreed to proceed.   History of Present Illness: The patient presents for an acute visit.  She states that on September 26, 2021, she started with symptoms consistent with upper respiratory infection.  She was sneezing.  Had congestion, cough, and excess mucus.  She states that on 09/27/2022 she had a temperature of 100.3.  She states that symptoms have been persistent and gradually worsening.  She states today her temperature was 99.8.  She states that throughout this 2 to 3-week period of time, she felt a little bit better.  She did go to her pharmacy and did get her flu and pneumonia vaccines, both on 09/30/2021.  She is unsure if these vaccines are contributing to the current symptoms.  She denies nausea, vomiting, or diarrhea.  She states she does have some mild headache, body aches and some chills.  She states she took a home test for COVID-19 on 09/29/2021 with negative results.   Observations/Objective:  The patient is alert and oriented. She is pleasant and answers all questions appropriately. Breathing is non-labored. She is in no acute distress at this time.  The patient sounds nasally congested and has a harsh cough.  Today's Vitals   10/05/21 0908  BP: 120/73  Pulse: 100  Temp: 99.8 F (37.7 C)  Weight: 154 lb 3.2 oz (69.9 kg)  Height: 5\' 5"  (1.651 m)   Body mass index is 25.66 kg/m.   Assessment  and Plan: 1. Acute non-recurrent pansinusitis Start Levaquin 500 mg tablets daily for next 10 days.  Recommend Coricidin HBP along with [place] DM for symptom management.  She should rest and drink plenty of fluids.  Advised her to contact the office if symptoms worsen or do not get better over next 3 to 4 days.  She voiced understanding agreement. - levofloxacin (LEVAQUIN) 500 MG tablet; Take 1 tablet (500 mg total) by mouth daily.  Dispense: 10 tablet; Refill: 0  2. Vitamin D deficiency Start Drisdol 50,000 IU once weekly for next 6 months. - ergocalciferol (DRISDOL) 1.25 MG (50000 UT) capsule; Take 1 capsule (50,000 Units total) by mouth once a week.  Dispense: 4 capsule; Refill: 5   Follow Up Instructions:    I discussed the assessment and treatment plan with the patient. The patient was provided an opportunity to ask questions and all were answered. The patient agreed with the plan and demonstrated an understanding of the instructions.   The patient was advised to call back or seek an in-person evaluation if the symptoms worsen or if the condition fails to improve as anticipated.  I provided 15 minutes of non-face-to-face time during this encounter.   Ronnell Freshwater, NP   This note was dictated using Systems analyst. Rapid proofreading was performed to expedite the delivery of the information. Despite proofreading, phonetic errors will occur which are common with this voice recognition software. Please take this into consideration. If there are any concerns,  please contact our office.

## 2021-10-05 NOTE — Telephone Encounter (Signed)
No she said she would pick them up in the morning she just wanted to let you know because she seen it on the AVS about swelling of the face and said it wasn't urgent but to just let you know.

## 2021-10-06 ENCOUNTER — Ambulatory Visit: Payer: Medicare Other | Admitting: Nurse Practitioner

## 2021-10-11 ENCOUNTER — Other Ambulatory Visit: Payer: Self-pay

## 2021-10-11 ENCOUNTER — Telehealth (INDEPENDENT_AMBULATORY_CARE_PROVIDER_SITE_OTHER): Payer: Medicare Other | Admitting: Cardiology

## 2021-10-11 ENCOUNTER — Encounter: Payer: Self-pay | Admitting: Cardiology

## 2021-10-11 VITALS — BP 141/81 | HR 102 | Temp 99.5°F | Ht 65.5 in | Wt 153.4 lb

## 2021-10-11 DIAGNOSIS — I10 Essential (primary) hypertension: Secondary | ICD-10-CM

## 2021-10-11 DIAGNOSIS — I25750 Atherosclerosis of native coronary artery of transplanted heart with unstable angina: Secondary | ICD-10-CM | POA: Diagnosis not present

## 2021-10-11 DIAGNOSIS — E7800 Pure hypercholesterolemia, unspecified: Secondary | ICD-10-CM

## 2021-10-11 DIAGNOSIS — R000 Tachycardia, unspecified: Secondary | ICD-10-CM | POA: Diagnosis not present

## 2021-10-11 DIAGNOSIS — R600 Localized edema: Secondary | ICD-10-CM

## 2021-10-11 DIAGNOSIS — R609 Edema, unspecified: Secondary | ICD-10-CM

## 2021-10-11 NOTE — Patient Instructions (Signed)
Medication Instructions:  °Your physician recommends that you continue on your current medications as directed. Please refer to the Current Medication list given to you today. ° °*If you need a refill on your cardiac medications before your next appointment, please call your pharmacy* ° ° °Lab Work: °NONE  ° °If you have labs (blood work) drawn today and your tests are completely normal, you will receive your results only by: °MyChart Message (if you have MyChart) OR °A paper copy in the mail °If you have any lab test that is abnormal or we need to change your treatment, we will call you to review the results. ° ° °Testing/Procedures: °NONE  ° ° °Follow-Up: °At [facility], you and your health needs are our priority.  As part of our continuing mission to provide you with exceptional heart care, we have created designated Provider Care Teams.  These Care Teams include your primary Cardiologist (physician) and Advanced Practice Providers (APPs -  Physician Assistants and Nurse Practitioners) who all work together to provide you with the care you need, when you need it. ° °We recommend signing up for the patient portal called "MyChart".  Sign up information is provided on this After Visit Summary.  MyChart is used to connect with patients for Virtual Visits (Telemedicine).  Patients are able to view lab/test results, encounter notes, upcoming appointments, etc.  Non-urgent messages can be sent to your provider as well.   °To learn more about what you can do with MyChart, go to [email or web address].   ° °Your next appointment:   °1 year(s) ° °The format for your next appointment:   °In Person ° °Provider:   °Dr. Traci Turner  ° ° °Other Instructions °Thank you for choosing [facility]! ° ° ° °

## 2021-10-11 NOTE — Progress Notes (Signed)
Virtual Visit via Video Note   This visit type was conducted due to national recommendations for restrictions regarding the COVID-19 Pandemic (e.g. social distancing) in an effort to limit this patient's exposure and mitigate transmission in our community.  Due to her co-morbid illnesses, this patient is at least at moderate risk for complications without adequate follow up.  This format is felt to be most appropriate for this patient at this time.  All issues noted in this document were discussed and addressed.  A limited physical exam was performed with this format.  Please refer to the patient's chart for her consent to telehealth for [facility].    Date:  10/11/2021   ID:  Patty Salas, DOB 1956/03/17, MRN [id number] The patient was identified using 2 identifiers.  Patient Location: Home Provider Location: Home Office   PCP:  Ronnell Freshwater, NP   [facility] Provider Cardiologist:  None     Evaluation Performed:  Follow-Up Visit  Chief Complaint:  LE edema  History of Present Illness:    Patty Salas is a 66 y.o. female with a hx of anxiety, asthma, fibromyalgia, HTN who was referred for evaluation of LE edema.  She has a hx of renal cell CA and is s/p nephrectomy.  She recently had problems with renal stones. She has a hx of IBS and diarrhea as well.  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     When I saw her in 2021 she had problems with weight gain and LE edema and pinkness of her legs. She has chronic DOE related to asthma and had been on albuterol but had not been taking it at her last OV.    2D echo showed normal heart function with EF 60-65% with G1DD.  Coronary CTA showed coronary Ca score of 117 with minimal CAD < 1-24% in the LAD/LCx and RCA and a small PFO.    She is here today for followup and is doing well.  She denies any chest pain or pressure, SOB, DOE, PND, orthopnea,  palpitations or syncope. She continues to have chronic LE  edema which is controlled on compression hose and PRN lasix.  She is compliant with her meds and is tolerating meds with no SE.     The patient does not have symptoms concerning for COVID-19 infection (fever, chills, cough, or new shortness of breath).    Past Medical History:  Diagnosis Date   Anxiety    Arthritis    Asthma    Bipolar 2 disorder ([place])    pt stated, "I have Bipolar 2 and it is remission"   Bipolar affective ([place])    CAD (coronary artery disease), native artery transplanted heart    Nonobstructive by coronary CTA 08/2021 with less than 25% stenosis in the LAD, RCA and left circumflex.   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   Full dentures    Gout    Headache(784.0)    migraines   Herpesviral infection    Hypertension    IBS (irritable bowel syndrome)    with diarrhea   Lymphadenopathy    Myalgia and myositis, unspecified    PAT (paroxysmal  atrial tachycardia) (HCC)    asymptomatic 10 beat run on event monitor 09/2020   PFO (patent foramen ovale)    Small PFO noted on coronary CTA   Pneumonia    PONV (postoperative nausea and vomiting)    PTSD (post-traumatic stress disorder)    Renal calculi    Restless legs syndrome (RLS)    Thoracic radiculopathy    Vitamin D deficiency    Wears glasses    Past Surgical History:  Procedure Laterality Date    kidney stones     ABDOMINAL ADHESION SURGERY     ABDOMINAL HYSTERECTOMY  1995   partial- hemmoraged after surgery-stitch came loose   APPENDECTOMY  1993   hemmoraged after surgery- stitch came loose   CERVICAL DISC SURGERY  06/04/2001   with fusion   CHOLECYSTECTOMY  1985   colonoscopy     COLONOSCOPY     CYSTOSCOPY     FOOT SURGERY     lt   INCISIONAL HERNIA REPAIR  N/A 04/22/2021   Procedure: LAPAROSCOPIC INCISIONAL HERNIA REPAIR WITH MESH;  Surgeon: Clovis Riley, MD;  Location: [facility];  Service: General;  Laterality: N/A;   jj stent  07/2002   with ureteroscopy, cystoscopy and then removal of stent in office   LAPAROSCOPIC LYSIS OF ADHESIONS N/A 04/22/2021   Procedure: LYSIS OF ADHESIONS;  Surgeon: Clovis Riley, MD;  Location: [facility];  Service: General;  Laterality: N/A;   LITHOTRIPSY     LYMPH NODE BIOPSY Right 03/06/2014   Procedure: right groin LYMPH NODE BIOPSY;  Surgeon: Merrie Roof, MD;  Location: [place];  Service: General;  Laterality: Right;   LYMPHADENECTOMY N/A 12/29/2015   Procedure: RETROPERITONEAL LYMPHADENECTOMY;  Surgeon: Alexis Frock, MD;  Location: [facility];  Service: Urology;  Laterality: N/A;   NECK SURGERY  2002   ROBOT ASSISTED LAPAROSCOPIC NEPHRECTOMY Left 12/29/2015   Procedure: XI ROBOTIC ASSISTED LEFT LAPAROSCOPIC NEPHRECTOMY;  Surgeon: Alexis Frock, MD;  Location: [facility];  Service: Urology;  Laterality: Left;   TONSILLECTOMY  1976     Current Meds  Medication Sig   acetaminophen (TYLENOL) 500 MG tablet Take 500 mg by mouth every 6 (six) hours as needed for moderate pain.   albuterol (VENTOLIN HFA) 108 (90 Base) MCG/ACT inhaler Inhale 2 puffs into the lungs every 6 (six) hours as needed for wheezing.   almotriptan (AXERT) 12.5 MG tablet Take 1 tablet (12.5 mg total) by mouth as needed for migraine. TAKE 1 TABLET BY MOUTH AS NEEDED FOR MIGRAINE (MAX 2 TABS PER WEEK). THIS IS 90-DAY RX. PAYS CASH   bisacodyl (DULCOLAX) 5 MG EC tablet Take 10 mg by mouth daily as needed for mild constipation.    cyclobenzaprine (FLEXERIL) 10 MG tablet Take 1 tablet (10 mg total) by mouth 3 (three) times daily.   docusate sodium (COLACE) 100 MG capsule Take 100 mg by mouth daily as needed for mild constipation.    EPINEPHRINE 0.3 mg/0.3 mL IJ SOAJ injection INJECT 0.3 MG INTO THE MUSCLE AS NEEDED FOR ANAPHYLAXIS.    ergocalciferol (DRISDOL) 1.25 MG (50000 UT) capsule Take 1 capsule (50,000 Units total) by mouth once a week.   fexofenadine (ALLEGRA) 180 MG tablet Take 1 tablet (180 mg total) by mouth daily.   furosemide (LASIX) 20 MG tablet Take 1 tablet (20 mg total) by mouth as needed for edema. Please schedule appointment for future refills. Thank you   gabapentin (NEURONTIN) 300 MG capsule Take 600 mg by  mouth at bedtime.   HYDROcodone-acetaminophen (NORCO/VICODIN) 5-325 MG tablet Take 1 tablet by mouth every 8 (eight) hours as needed for moderate pain.   hyoscyamine (ANASPAZ) 0.125 MG TBDP disintergrating tablet hyoscyamine 0.125 mg disintegrating tablet  TAKE 1 TABLET BY MOUTH 4 TIMES DAILY AS NEEDED FOR UP TO 60 DOSES (CRAMPING, PAIN)   levofloxacin (LEVAQUIN) 500 MG tablet Take 1 tablet (500 mg total) by mouth daily.   Loperamide HCl (IMODIUM A-D PO)    LORazepam (ATIVAN) 1 MG tablet Take 1 mg by mouth 3 (three) times daily.   promethazine (PHENERGAN) 25 MG tablet TAKE 1/2 TABLET (12.5 MG TOTAL) BY MOUTH EVERY 6 HOURS AS NEEDED FOR NAUSEA AND VOMITING   Trospium Chloride 60 MG CP24 Take 1 capsule by mouth every morning.      Allergies:   Aripiprazole, Cymbalta [duloxetine hcl], Divalproex sodium, Duloxetine, Duract [bromfenac], Hydrochlorothiazide, Imitrex [sumatriptan succinate], Latex, Mellaril, Olanzapine, Penicillins, Quetiapine, Statins, Topamax, Topiramate, Aripiprazole, Aspirin, Dalmane [flurazepam hcl], Darifenacin hydrobromide er, Metoclopramide hcl, Seroquel [quetiapine fumerate], Stelazine, Thorazine [chlorpromazine hcl], Alka-seltzer [aspirin effervescent], Allopurinol, Avelox [moxifloxacin], Biofreeze [menthol (topical analgesic)], Capzasin [capsaicin], Clindamycin/lincomycin, Clove oil, Colchicine, Cough drops [benzocaine], Cycloheximide, Darifenacin, Diatrizoate, Diflucan [fluconazole], Garlic, Iohexol, Ivp dye [iodinated contrast media], Lyrica [pregabalin], Metamucil [psyllium], Miralax  [polyethylene glycol], Myrbetriq [mirabegron], Nsaids, Other, Oxybutynin, Potassium-containing compounds, Prednisone, Red dye, Reglan [metoclopramide], Risperdal [risperidone], Seroquel [quetiapine fumarate], Simvastatin, Urocit - k [potassium citrate], Zyprexa [olanzapine], Avelox [moxifloxacin hcl in nacl], Barium-containing compounds, Diclofenac, Doxycycline, E-mycin [erythromycin base], Fiorinal [butalbital-aspirin-caffeine], Flagyl [metronidazole hcl], Lamictal [lamotrigine], Pregabalin, Risperidone, and Toradol [ketorolac tromethamine]   Social History   Tobacco Use   Smoking status: Never   Smokeless tobacco: Never  Vaping Use   Vaping Use: Never used  Substance Use Topics   Alcohol use: No   Drug use: No     Family Hx: The patient's family history includes Alcohol abuse in her brother; Arthritis in her maternal grandmother and mother; Bipolar disorder in her brother; Cancer in her father, maternal aunt, maternal grandmother, mother, paternal uncle, and paternal uncle; Dementia in her father; Diabetes in her maternal grandmother; Emphysema in her maternal aunt and mother; Heart disease in her father and maternal aunt; Hypertension in her brother, brother, father, and maternal aunt; Migraines in her brother, brother, daughter, and mother; Stroke in her maternal grandmother.  ROS:   Please see the history of present illness.     All other systems reviewed and are negative.   Prior CV studies:   The following studies were reviewed today:  Coronary CTA and 2D echo  Labs/Other Tests and Data Reviewed:    EKG:  No ECG reviewed.  Recent Labs: 04/23/2021: Magnesium 1.7 06/13/2021: Hemoglobin 11.7; Platelets 295 08/11/2021: BUN 15; Creatinine, Ser 0.81; Potassium 4.2; Sodium 143 08/18/2021: ALT 31   Recent Lipid Panel Lab Results  Component Value Date/Time   CHOL 264 (H) 08/18/2021 09:13 AM   TRIG 255 (H) 08/18/2021 09:13 AM   HDL 58 08/18/2021 09:13 AM   CHOLHDL 4.6 (H)  08/18/2021 09:13 AM   LDLCALC 159 (H) 08/18/2021 09:13 AM    Wt Readings from Last 3 Encounters:  10/11/21 153 lb 6.4 oz (69.6 kg)  10/05/21 154 lb 3.2 oz (69.9 kg)  09/06/21 153 lb (69.4 kg)     Risk Assessment/Calculations:          Objective:    Vital Signs:  BP (!) 141/81    Pulse (!) 102    Temp 99.5 F (37.5 C)  Ht 5' 5.5" (1.664 m)    Wt 153 lb 6.4 oz (69.6 kg)    BMI 25.14 kg/m    VITAL SIGNS:  reviewed GEN:  no acute distress EYES:  sclerae anicteric, EOMI - Extraocular Movements Intact RESPIRATORY:  normal respiratory effort, symmetric expansion CARDIOVASCULAR:  no peripheral edema SKIN:  no rash, lesions or ulcers. MUSCULOSKELETAL:  no obvious deformities. NEURO:  alert and oriented x 3, no obvious focal deficit PSYCH:  normal affect  ASSESSMENT & PLAN:    HTN -BP is adequately controlled on exam today -diet controlled   2.  LE edema -? Whether this is related to her pelvic lymphoma -2D echocardiogram showed normal LV function with grade 1 diastolic dysfunction -I encouraged her to follow less than 2 g sodium diet to prevent lower extremity edema -continue lasix as needed -continue to follow with Oncology  3.  Sinus tachycardia/PAT -HR remains mildly elevated again at 102 bpm today but denies any palpitations -likely related to chronic pain and deconditioning -She wore a Zio patch in September 20, 2020 for elevated heart rate and average heart rate was 89 bpm  4.  ASCAD -coronary Ca score of 117 with minimal CAD < 1-24% in the LAD/LCx and RCA -She denies any anginal symptoms -Her last LDL was 155 and she was referred to our lipid clinic with Pharm.D but this never occurred so I will place another referral>> she is statin intolerant -She gets hives with aspirin  5.  HLD -LDL goal is less than 70 -Her last LDL was 155 -We will set up for Pharm.D. in lipid clinic as she is statin intolerant -She would benefit from addition of a PCSK9 inhibitor      COVID-19 Education: The signs and symptoms of COVID-19 were discussed with the patient and how to seek care for testing (follow up with PCP or arrange E-visit).  The importance of social distancing was discussed today.  Time:   Today, I have spent 25 minutes with the patient with telehealth technology discussing the above problems.     Medication Adjustments/Labs and Tests Ordered: Current medicines are reviewed at length with the patient today.  Concerns regarding medicines are outlined above.   Tests Ordered: No orders of the defined types were placed in this encounter.   Medication Changes: No orders of the defined types were placed in this encounter.   Follow Up:  In Person in 1 year(s)  Signed, Fransico Him, MD  10/11/2021 3:37 PM    [place]

## 2021-10-13 ENCOUNTER — Ambulatory Visit: Payer: Medicare Other

## 2021-10-14 ENCOUNTER — Ambulatory Visit: Payer: Medicare Other

## 2021-10-19 ENCOUNTER — Telehealth (INDEPENDENT_AMBULATORY_CARE_PROVIDER_SITE_OTHER): Payer: Medicare Other | Admitting: Nurse Practitioner

## 2021-10-19 ENCOUNTER — Other Ambulatory Visit: Payer: Self-pay

## 2021-10-19 ENCOUNTER — Encounter: Payer: Self-pay | Admitting: Nurse Practitioner

## 2021-10-19 DIAGNOSIS — J0140 Acute pansinusitis, unspecified: Secondary | ICD-10-CM

## 2021-10-19 MED ORDER — LEVOFLOXACIN 500 MG PO TABS: 500.0000 mg | ORAL_TABLET | Freq: Every day | ORAL | 0 refills | Status: DC

## 2021-10-19 NOTE — Progress Notes (Signed)
MyChart Video Visit    Virtual Visit via Video Note   This visit type was conducted due to national recommendations for restrictions regarding the COVID-19 Pandemic (e.g. social distancing) in an effort to limit this patient's exposure and mitigate transmission in our community. This patient is at least at moderate risk for complications without adequate follow up. This format is felt to be most appropriate for this patient at this time. Physical exam was limited by quality of the video and audio technology used for the visit.   Patient location: home Provider location: [place] [facility] at [facility]    I discussed the limitations of evaluation and management by telemedicine and the availability of in person appointments. The patient expressed understanding and agreed to proceed.  Patient: Patty Salas   DOB: 1956/06/25   66 y.o. Female  MRN: [id number] Visit Date: 10/19/2021  Today's healthcare provider: Ronnell Freshwater, NP   Chief Complaint  Patient presents with   Sinusitis   Subjective    The patient was treated for acute sinusitis 10/06/2021. She was treated with levofloxacin 500mg  daily for 10 days. This is one of the few medications she is able to take. She states that she did feel better after taking the antibiotic. She did take multisystem cold and flu medication which did help manage the symptoms. She is using nasal saline, warm showers. She states that new symptoms started again yesterday. She did run fever of >102.   Sinusitis This is a recurrent problem. The current episode started in the past 7 days. The problem has been gradually worsening since onset. The maximum temperature recorded prior to her arrival was 102 - 102.9 F. The fever has been present for Less than 1 day. Associated symptoms include congestion, ear pain, headaches, sinus pressure and a sore throat. Pertinent negatives include no chills, coughing, shortness of breath or sneezing. Past treatments  include antibiotics. The treatment provided mild relief.    Medications: Outpatient Medications Prior to Visit  Medication Sig   acetaminophen (TYLENOL) 500 MG tablet Take 500 mg by mouth every 6 (six) hours as needed for moderate pain.   albuterol (VENTOLIN HFA) 108 (90 Base) MCG/ACT inhaler Inhale 2 puffs into the lungs every 6 (six) hours as needed for wheezing.   almotriptan (AXERT) 12.5 MG tablet Take 1 tablet (12.5 mg total) by mouth as needed for migraine. TAKE 1 TABLET BY MOUTH AS NEEDED FOR MIGRAINE (MAX 2 TABS PER WEEK). THIS IS 90-DAY RX. PAYS CASH   bisacodyl (DULCOLAX) 5 MG EC tablet Take 10 mg by mouth daily as needed for mild constipation.    cyclobenzaprine (FLEXERIL) 10 MG tablet Take 1 tablet (10 mg total) by mouth 3 (three) times daily.   docusate sodium (COLACE) 100 MG capsule Take 100 mg by mouth daily as needed for mild constipation.    EPINEPHRINE 0.3 mg/0.3 mL IJ SOAJ injection INJECT 0.3 MG INTO THE MUSCLE AS NEEDED FOR ANAPHYLAXIS.   ergocalciferol (DRISDOL) 1.25 MG (50000 UT) capsule Take 1 capsule (50,000 Units total) by mouth once a week.   fexofenadine (ALLEGRA) 180 MG tablet Take 1 tablet (180 mg total) by mouth daily.   furosemide (LASIX) 20 MG tablet Take 1 tablet (20 mg total) by mouth as needed for edema. Please schedule appointment for future refills. Thank you   gabapentin (NEURONTIN) 300 MG capsule Take 600 mg by mouth at bedtime.   HYDROcodone-acetaminophen (NORCO/VICODIN) 5-325 MG tablet Take 1 tablet by mouth every  8 (eight) hours as needed for moderate pain.   hyoscyamine (ANASPAZ) 0.125 MG TBDP disintergrating tablet hyoscyamine 0.125 mg disintegrating tablet  TAKE 1 TABLET BY MOUTH 4 TIMES DAILY AS NEEDED FOR UP TO 60 DOSES (CRAMPING, PAIN)   Loperamide HCl (IMODIUM A-D PO)    LORazepam (ATIVAN) 1 MG tablet Take 1 mg by mouth 3 (three) times daily.   promethazine (PHENERGAN) 25 MG tablet TAKE 1/2 TABLET (12.5 MG TOTAL) BY MOUTH EVERY 6 HOURS AS NEEDED  FOR NAUSEA AND VOMITING   Trospium Chloride 60 MG CP24 Take 1 capsule by mouth every morning.    [DISCONTINUED] levofloxacin (LEVAQUIN) 500 MG tablet Take 1 tablet (500 mg total) by mouth daily.   No facility-administered medications prior to visit.    Review of Systems  Constitutional:  Positive for fatigue and fever. Negative for activity change, appetite change and chills.  HENT:  Positive for congestion, ear pain, postnasal drip, rhinorrhea, sinus pressure, sinus pain and sore throat. Negative for sneezing.   Eyes: Negative.   Respiratory:  Negative for cough, chest tightness, shortness of breath and wheezing.   Cardiovascular:  Negative for chest pain and palpitations.  Gastrointestinal:  Positive for nausea. Negative for abdominal pain, constipation, diarrhea and vomiting.  Endocrine: Negative for cold intolerance, heat intolerance, polydipsia and polyuria.  Genitourinary:  Negative for dyspareunia, dysuria, flank pain, frequency and urgency.  Musculoskeletal:  Positive for arthralgias. Negative for back pain and myalgias.  Skin:  Negative for rash.  Allergic/Immunologic: Negative for environmental allergies.  Neurological:  Positive for headaches. Negative for dizziness and weakness.  Hematological:  Positive for adenopathy.  Psychiatric/Behavioral:  The patient is not nervous/anxious.      Objective      The patient is alert and oriented. She is pleasant and answers all questions appropriately. Breathing is non-labored. She is in no acute distress at this time.  The patient is nasally congested.   BP 125/75    Temp 99.8 F (37.7 C)    Ht 5' 5.5" (1.664 m)    Wt 153 lb 3.2 oz (69.5 kg)    BMI 25.11 kg/m        Assessment & Plan     1. Acute non-recurrent pansinusitis Treat with round Levaquin 500 mg daily for 10 days. Rest and increase fluids. Continue using OTC medication to control symptoms.   - levofloxacin (LEVAQUIN) 500 MG tablet; Take 1 tablet (500 mg total) by  mouth daily.  Dispense: 10 tablet; Refill: 0   Return in about 2 months (around 12/17/2021) for blood pressure - patient to call and schedule appointment .     I discussed the assessment and treatment plan with the patient. The patient was provided an opportunity to ask questions and all were answered. The patient agreed with the plan and demonstrated an understanding of the instructions.   The patient was advised to call back or seek an in-person evaluation if the symptoms worsen or if the condition fails to improve as anticipated.  I provided 15 minutes of non-face-to-face time during this encounter.  Ronnell Freshwater, NP [facility] at [facility] [phone] (phone) [phone] (fax)  [place]   This note was dictated using Dragon Voice Recognition Software. Rapid proofreading was performed to expedite the delivery of the information. Despite proofreading, phonetic errors will occur which are common with this voice recognition software. Please take this into consideration. If there are any concerns, please contact our office.

## 2021-10-25 ENCOUNTER — Telehealth: Payer: Self-pay

## 2021-10-25 ENCOUNTER — Other Ambulatory Visit: Payer: Self-pay | Admitting: Neurology

## 2021-10-25 NOTE — Telephone Encounter (Signed)
Patient is calling stating she has been running a fever and wants to know if she should cancel her appointment on Thursday 10/27/21 or change it to a video visit. She stated she has 21 Norco, which is about 7 days. Please advise

## 2021-10-26 ENCOUNTER — Telehealth: Payer: Self-pay | Admitting: Registered Nurse

## 2021-10-26 DIAGNOSIS — G894 Chronic pain syndrome: Secondary | ICD-10-CM

## 2021-10-26 DIAGNOSIS — M797 Fibromyalgia: Secondary | ICD-10-CM

## 2021-10-26 MED ORDER — HYDROCODONE-ACETAMINOPHEN 5-325 MG PO TABS: 1.0000 | ORAL_TABLET | Freq: Three times a day (TID) | ORAL | 0 refills | Status: DC | PRN

## 2021-10-26 NOTE — Telephone Encounter (Signed)
Spoke with patient and she stated she is going to try to come in on Monday 10/31/21. She stated she is on another round of antibiotics and hopes it works. She stated her main thing is her prescription runs out on 1/30 and she will need a refill on her pain medication

## 2021-10-26 NOTE — Telephone Encounter (Signed)
PMP was Reviewed Hydrocodone e-scribed today.  Placed a call to Ms. Swalley regarding the above, she knows she must come into the office in February, she verbalizes understanding.

## 2021-10-27 ENCOUNTER — Telehealth: Payer: Self-pay

## 2021-10-27 ENCOUNTER — Encounter: Payer: Medicare Other | Admitting: Registered Nurse

## 2021-10-27 ENCOUNTER — Telehealth: Payer: Self-pay | Admitting: Neurology

## 2021-10-27 DIAGNOSIS — G894 Chronic pain syndrome: Secondary | ICD-10-CM

## 2021-10-27 DIAGNOSIS — M797 Fibromyalgia: Secondary | ICD-10-CM

## 2021-10-27 MED ORDER — HYDROCODONE-ACETAMINOPHEN 5-325 MG PO TABS: 1.0000 | ORAL_TABLET | Freq: Three times a day (TID) | ORAL | 0 refills | Status: DC | PRN

## 2021-10-27 NOTE — Telephone Encounter (Signed)
PMP was Reviewed.  Hydrocodone e-scribed today.  

## 2021-10-27 NOTE — Telephone Encounter (Signed)
Norco on back order at [facility], send new prescription to [facility]. Will cancel prescription at [facility].

## 2021-10-27 NOTE — Telephone Encounter (Signed)
Sent my chart message to the pt on this. Once pt has used the 10 pills we can address refill.

## 2021-10-27 NOTE — Telephone Encounter (Signed)
Patient was informed to call front desk

## 2021-10-27 NOTE — Telephone Encounter (Signed)
Pt wanted to let the physician know is not abusing promethazine (PHENERGAN) 25 MG tablet. Have 10 whole tablets left.

## 2021-10-31 ENCOUNTER — Ambulatory Visit: Payer: Medicare Other | Admitting: Registered Nurse

## 2021-11-01 ENCOUNTER — Ambulatory Visit: Payer: Medicare Other

## 2021-11-02 ENCOUNTER — Ambulatory Visit: Payer: Medicare Other

## 2021-11-02 ENCOUNTER — Other Ambulatory Visit: Payer: Self-pay | Admitting: Neurology

## 2021-11-02 MED ORDER — PROMETHAZINE HCL 25 MG PO TABS: ORAL_TABLET | ORAL | 0 refills | Status: DC

## 2021-11-02 NOTE — Telephone Encounter (Addendum)
I called the patient. She needs promethazine sent to the pharmacy. Continuation of treatment - takes prn. Refill sent.

## 2021-11-02 NOTE — Telephone Encounter (Signed)
Pt called stating that she is needing this medication called in for [facility] to the [facility] on [address]. Pt states she is needing the refill today.

## 2021-11-02 NOTE — Addendum Note (Signed)
Addended by: Noberto Retort C on: 11/02/2021 10:48 AM   Modules accepted: Orders

## 2021-11-16 ENCOUNTER — Ambulatory Visit: Payer: Medicare Other | Admitting: Neurology

## 2021-11-21 ENCOUNTER — Telehealth: Payer: Self-pay | Admitting: Neurology

## 2021-11-21 NOTE — Telephone Encounter (Signed)
Pt requesting refill for promethazine (PHENERGAN) 25 MG tablet. [id number] [place], [place]. Only has 2 tablets left. Pt asking for mychart message once Rx has been sent in.

## 2021-11-21 NOTE — Telephone Encounter (Signed)
I called the patient back. Her last refill of promethazine 25mg  was for #30 on 11/02/21. I asked her about the frequency of use since generally 30 tablets are given each month. Says she is having 3-4 migraines each week. She uses almotriptan twice weekly to make the 8 tablets last. For the others, she uses promethazine. She has a pending appt 02/22/22. She has two tablets left of her current prescription (taken #28 since 11/02/21). She is asking for a refill. She would also like clarification on the new rx so she can space them out better (length of time the supply should last).

## 2021-11-22 MED ORDER — PROMETHAZINE HCL 25 MG PO TABS: ORAL_TABLET | ORAL | 0 refills | Status: DC

## 2021-11-22 NOTE — Addendum Note (Signed)
Addended by: Suzzanne Cloud on: 11/22/2021 07:49 AM   Modules accepted: Orders

## 2021-11-22 NOTE — Telephone Encounter (Signed)
I spoke to the patient. She verbalized understanding of Sarah's response below. She wants to hold off on a referral to a headache clinic at this time. She has a pending follow up here on 02/02/22. She wants to discuss it more at that visit.

## 2021-11-22 NOTE — Telephone Encounter (Signed)
I think # 30 tablets is more than plentiful, this should only be used for significant headache, not to be used daily. If this is not sufficient she needs revisit to discuss her headache management, I think we should go ahead and refer her to a headache clinic.

## 2021-11-23 ENCOUNTER — Encounter: Payer: Medicare Other | Attending: Registered Nurse | Admitting: Registered Nurse

## 2021-11-23 ENCOUNTER — Encounter: Payer: Self-pay | Admitting: Registered Nurse

## 2021-11-23 ENCOUNTER — Other Ambulatory Visit: Payer: Self-pay

## 2021-11-23 VITALS — BP 124/60 | HR 108 | Ht 65.5 in | Wt 149.8 lb

## 2021-11-23 DIAGNOSIS — M7918 Myalgia, other site: Secondary | ICD-10-CM | POA: Insufficient documentation

## 2021-11-23 DIAGNOSIS — G894 Chronic pain syndrome: Secondary | ICD-10-CM | POA: Insufficient documentation

## 2021-11-23 DIAGNOSIS — G8929 Other chronic pain: Secondary | ICD-10-CM | POA: Diagnosis present

## 2021-11-23 DIAGNOSIS — M797 Fibromyalgia: Secondary | ICD-10-CM | POA: Diagnosis present

## 2021-11-23 DIAGNOSIS — M7062 Trochanteric bursitis, left hip: Secondary | ICD-10-CM | POA: Diagnosis present

## 2021-11-23 DIAGNOSIS — M5416 Radiculopathy, lumbar region: Secondary | ICD-10-CM | POA: Diagnosis present

## 2021-11-23 DIAGNOSIS — M546 Pain in thoracic spine: Secondary | ICD-10-CM | POA: Insufficient documentation

## 2021-11-23 DIAGNOSIS — M5412 Radiculopathy, cervical region: Secondary | ICD-10-CM | POA: Diagnosis present

## 2021-11-23 DIAGNOSIS — I25750 Atherosclerosis of native coronary artery of transplanted heart with unstable angina: Secondary | ICD-10-CM

## 2021-11-23 DIAGNOSIS — M7061 Trochanteric bursitis, right hip: Secondary | ICD-10-CM | POA: Diagnosis present

## 2021-11-23 DIAGNOSIS — M654 Radial styloid tenosynovitis [de Quervain]: Secondary | ICD-10-CM | POA: Diagnosis present

## 2021-11-23 DIAGNOSIS — Z5181 Encounter for therapeutic drug level monitoring: Secondary | ICD-10-CM | POA: Diagnosis present

## 2021-11-23 DIAGNOSIS — M47812 Spondylosis without myelopathy or radiculopathy, cervical region: Secondary | ICD-10-CM | POA: Diagnosis present

## 2021-11-23 DIAGNOSIS — Z79891 Long term (current) use of opiate analgesic: Secondary | ICD-10-CM | POA: Insufficient documentation

## 2021-11-23 DIAGNOSIS — M542 Cervicalgia: Secondary | ICD-10-CM | POA: Insufficient documentation

## 2021-11-23 MED ORDER — HYDROCODONE-ACETAMINOPHEN 5-325 MG PO TABS: 1.0000 | ORAL_TABLET | Freq: Three times a day (TID) | ORAL | 0 refills | Status: DC | PRN

## 2021-11-23 MED ORDER — CYCLOBENZAPRINE HCL 10 MG PO TABS: 10.0000 mg | ORAL_TABLET | Freq: Three times a day (TID) | ORAL | 4 refills | Status: DC

## 2021-11-23 MED ORDER — METHYLPREDNISOLONE 4 MG PO TBPK: ORAL_TABLET | ORAL | 0 refills | Status: DC

## 2021-11-23 NOTE — Progress Notes (Signed)
Subjective:    Patient ID: Patty Salas, female    DOB: 1956/04/01, 66 y.o.   MRN: [id number]  HPI: Patty Salas is a 66 y.o. female who returns for follow up appointment for chronic pain and medication refill. She states her pain is located in her neck radiating into her bilateral shoulders, mid- lower back pain radiating into her bilateral hips and bilateral lower extremities. Also reports right wrist pain,  bilateral knee pain and generalized joint pain. She rates her pain 9. Her current exercise regime is walking and performing stretching exercises.  Patty Salas Morphine equivalent is 13.33  MME. She is also prescribed Lorazepam  by Dr. Reece Levy .We have discussed the black box warning of using opioids and benzodiazepines. I highlighted the dangers of using these drugs together and discussed the adverse events including respiratory suppression, overdose, cognitive impairment and importance of compliance with current regimen. We will continue to monitor and adjust as indicated.  she is being closely monitored and under the care of her psychiatrist Dr Reece Levy.     UDS ordered today.     Pain Inventory Average Pain 9 Pain Right Now 10 My pain is constant, sharp, burning, stabbing, tingling, and aching  In the last 24 hours, has pain interfered with the following? General activity 9 Relation with others 7 Enjoyment of life 9 What TIME of day is your pain at its worst? morning , daytime, evening, and night Sleep (in general) Fair  Pain is worse with: walking, bending, sitting, inactivity, standing, and some activites Pain improves with: rest, heat/ice, therapy/exercise, medication, and injections Relief from Meds: 5  Family History  Problem Relation Age of Onset   Cancer Mother        stomach   Migraines Mother    Arthritis Mother    Emphysema Mother    Heart disease Father    Hypertension Father    Cancer Father        colon   Dementia Father    Hypertension Brother     Migraines Brother    Hypertension Brother    Migraines Brother    Alcohol abuse Brother    Bipolar disorder Brother    Migraines Daughter    Arthritis Maternal Grandmother    Cancer Maternal Grandmother        stomach   Diabetes Maternal Grandmother    Stroke Maternal Grandmother    Cancer Maternal Aunt        lung   Emphysema Maternal Aunt    Cancer Paternal Uncle        colon   Hypertension Maternal Aunt    Heart disease Maternal Aunt    Cancer Paternal Uncle        lung   Social History   Socioeconomic History   Marital status: Married    Spouse name: Not on file   Number of children: 1   Years of education: COLLEGE1   Highest education level: Not on file  Occupational History   Occupation: HOUSEWIFE    Employer: UNEMPLOYED  Tobacco Use   Smoking status: Never   Smokeless tobacco: Never  Vaping Use   Vaping Use: Never used  Substance and Sexual Activity   Alcohol use: No   Drug use: No   Sexual activity: Not on file  Other Topics Concern   Not on file  Social History Narrative   Patient is right handed.   Patient drinks caffeine occasionally.   Social Determinants of Health  Financial Resource Strain: Not on file  Food Insecurity: Not on file  Transportation Needs: Not on file  Physical Activity: Not on file  Stress: Not on file  Social Connections: Not on file   Past Surgical History:  Procedure Laterality Date    kidney stones     [place]   partial- hemmoraged after surgery-stitch came loose   APPENDECTOMY  1993   hemmoraged after surgery- stitch came loose   CERVICAL DISC SURGERY  06/04/2001   with fusion   [place]   colonoscopy     COLONOSCOPY     CYSTOSCOPY     FOOT SURGERY     lt   INCISIONAL HERNIA REPAIR N/A 04/22/2021   Procedure: LAPAROSCOPIC INCISIONAL HERNIA REPAIR WITH MESH;  Surgeon: Clovis Riley, MD;  Location: [facility];  Service: General;  Laterality: N/A;    jj stent  07/2002   with ureteroscopy, cystoscopy and then removal of stent in office   LAPAROSCOPIC LYSIS OF ADHESIONS N/A 04/22/2021   Procedure: LYSIS OF ADHESIONS;  Surgeon: Clovis Riley, MD;  Location: [facility];  Service: General;  Laterality: N/A;   LITHOTRIPSY     LYMPH NODE BIOPSY Right 03/06/2014   Procedure: right groin LYMPH NODE BIOPSY;  Surgeon: Merrie Roof, MD;  Location: [facility];  Service: General;  Laterality: Right;   LYMPHADENECTOMY N/A 12/29/2015   Procedure: RETROPERITONEAL LYMPHADENECTOMY;  Surgeon: Alexis Frock, MD;  Location: [facility];  Service: Urology;  Laterality: N/A;   NECK SURGERY  2002   ROBOT ASSISTED LAPAROSCOPIC NEPHRECTOMY Left 12/29/2015   Procedure: XI ROBOTIC ASSISTED LEFT LAPAROSCOPIC NEPHRECTOMY;  Surgeon: Alexis Frock, MD;  Location: [facility];  Service: Urology;  Laterality: Left;   TONSILLECTOMY  1976   Past Surgical History:  Procedure Laterality Date    kidney stones     ABDOMINAL ADHESION SURGERY     ABDOMINAL HYSTERECTOMY  1995   partial- hemmoraged after surgery-stitch came loose   APPENDECTOMY  1993   hemmoraged after surgery- stitch came loose   CERVICAL DISC SURGERY  06/04/2001   with fusion   [place]   colonoscopy     COLONOSCOPY     CYSTOSCOPY     FOOT SURGERY     lt   INCISIONAL HERNIA REPAIR N/A 04/22/2021   Procedure: LAPAROSCOPIC INCISIONAL HERNIA REPAIR WITH MESH;  Surgeon: Clovis Riley, MD;  Location: [facility];  Service: General;  Laterality: N/A;   jj stent  07/2002   with ureteroscopy, cystoscopy and then removal of stent in office   LAPAROSCOPIC LYSIS OF ADHESIONS N/A 04/22/2021   Procedure: LYSIS OF ADHESIONS;  Surgeon: Clovis Riley, MD;  Location: [facility];  Service: General;  Laterality: N/A;   LITHOTRIPSY     LYMPH NODE BIOPSY Right 03/06/2014   Procedure: right groin LYMPH NODE BIOPSY;  Surgeon: Merrie Roof, MD;  Location: [facility];  Service: General;   Laterality: Right;   LYMPHADENECTOMY N/A 12/29/2015   Procedure: RETROPERITONEAL LYMPHADENECTOMY;  Surgeon: Alexis Frock, MD;  Location: [facility];  Service: Urology;  Laterality: N/A;   NECK SURGERY  2002   ROBOT ASSISTED LAPAROSCOPIC NEPHRECTOMY Left 12/29/2015   Procedure: XI ROBOTIC ASSISTED LEFT LAPAROSCOPIC NEPHRECTOMY;  Surgeon: Alexis Frock, MD;  Location: [facility];  Service: Urology;  Laterality: Left;   TONSILLECTOMY  1976   Past Medical History:  Diagnosis Date  Anxiety    Arthritis    Asthma    Bipolar 2 disorder ([place])    pt stated, "I have Bipolar 2 and it is remission"   Bipolar affective ([place])    CAD (coronary artery disease), native artery transplanted heart    Nonobstructive by coronary CTA 08/2021 with less than 25% stenosis in the LAD, RCA and left circumflex.   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   Full dentures    Gout    Headache(784.0)    migraines   Herpesviral infection    Hypertension    IBS (irritable bowel syndrome)    with diarrhea   Lymphadenopathy    Myalgia and myositis, unspecified    PAT (paroxysmal atrial tachycardia) (HCC)    asymptomatic 10 beat run on event monitor 09/2020   PFO (patent foramen ovale)    Small PFO noted on coronary CTA   Pneumonia    PONV (postoperative nausea and vomiting)    PTSD (post-traumatic stress disorder)    Renal calculi    Restless legs syndrome (RLS)    Thoracic radiculopathy    Vitamin D deficiency    Wears glasses    BP 124/60    Pulse (!) 110    Ht 5' 5.5" (1.664 m)    Wt 149 lb 12.8 oz (67.9 kg)    SpO2 96%    BMI 24.55 kg/m   Opioid Risk Score:   Fall Risk Score:  `1  Depression screen PHQ 2/9  Depression screen [facility] 2/9 11/23/2021 10/19/2021 10/05/2021  09/06/2021 08/19/2021 06/28/2021 04/26/2021  Decreased Interest 1 0 1 0 1 1 0  Down, Depressed, Hopeless 1 0 0 0 1 1 0  PHQ - 2 Score 2 0 1 0 2 2 0  Altered sleeping - 0 0 0 - - -  Tired, decreased energy - 1 1 1  - - -  Change in appetite - 0 1 0 - - -  Feeling bad or failure about yourself  - 0 0 0 - - -  Trouble concentrating - 1 1 0 - - -  Moving slowly or fidgety/restless - 0 0 1 - - -  Suicidal thoughts - 0 0 0 - - -  PHQ-9 Score - 2 4 2  - - -  Difficult doing work/chores - - - - - - -  Some recent data might be hidden     Review of Systems  Musculoskeletal:  Positive for myalgias.  Neurological:  Positive for tremors, weakness and numbness.  All other systems reviewed and are negative.     Objective:   Physical Exam Vitals and nursing note reviewed.  Constitutional:      Appearance: Normal appearance.  Neck:     Comments: Cervical Paraspinal Tenderness: C-5-C-6 Cardiovascular:     Rate and Rhythm: Normal rate and regular rhythm.     Pulses: Normal pulses.     Heart sounds: Normal heart sounds.  Pulmonary:     Effort: Pulmonary effort is normal.     Breath sounds: Normal breath sounds.  Musculoskeletal:     Cervical back: Normal range of motion and neck supple.     Comments: Normal Muscle Bulk and Muscle Testing  Reveals:  Upper Extremities: Full ROM and Muscle Strength 5/5 Bilateral AC Joint Tenderness Thoracic Paraspinal Tenderness: T-7-T-9 Lumbar Paraspinal Tenderness: L-4-L-5 Bilateral Greater Trochanter Tenderness  Lower Extremities: Full ROM and Muscle Strength 5/5 Arises from Table with ease Narrow Based Gait     Skin:    General: Skin is warm and dry.  Neurological:     Mental Status: She is alert and oriented to person, place, and time.  Psychiatric:        Mood and Affect: Mood normal.        Behavior: Behavior normal.         Assessment & Plan:  1 History of fibromyalgia with myofascial pain and multiple trigger points.Continue with Heat and  exercise Regime. Continue with current medication regimen with  gabapentin. 02/222023 2 Chronic migraine headaches.Continue to Monitor. S/P Botox.on 05/30/2017. 11/23/2021 3. Midline Low Back Pain/ Lumbar Spondylosis/Lumbar degenerative disk disease, L4-5/ Lumbar Radiculitis: Continue with HEP and current medication regimen. Continue Hydrocodone 5/325 mg one tablet every 8 hours as needed #80. Marland KitchenContinue with slow weaning of Hydrocodone. Second script sent for the following month to accommodate scheduled appointment . 11/23/2021 4. 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 Urology Following. 11/23/2021. 5. 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 11/23/2021 6. Cervicalgia/ Cervical RadiculitisCervical Spondylosis:  Continue current medication regiment with  Gabapentin. Continue  with HEP and Continue to monitor. 11/23/2021 7. Muscle Spasm: Myofascial Pain: She is svcheduled for Trigger Point Injection with Dr Naaman Plummer : Continue current medication regimen with Flexeril as needed. 11/23/2021 8. Bilateral Greater Trochanter Bursitis: Continue to alternate with ice and heat therapy. Continue HEP as Tolerated. Continue to monitor. 11/23/2021 9. Bilateral Knee Pain: S/P Knee Injection by Dr Sharol Given on 02/23/2020. Orthopedics Following. Continue to Monitor. 11/23/2021 10. DeQuervain Tenosynovitis: RX: Medrol Dose Pak. Continue to Monitor.    F/U in 1 month

## 2021-11-27 LAB — TOXASSURE SELECT,+ANTIDEPR,UR

## 2021-11-30 ENCOUNTER — Encounter: Payer: Medicare Other | Admitting: Physical Medicine & Rehabilitation

## 2021-11-30 ENCOUNTER — Telehealth: Payer: Self-pay | Admitting: *Deleted

## 2021-11-30 NOTE — Telephone Encounter (Signed)
Urine drug screen for this encounter is consistent for prescribed medication 

## 2021-12-06 ENCOUNTER — Ambulatory Visit: Payer: Medicare Other | Admitting: Nurse Practitioner

## 2021-12-07 NOTE — Telephone Encounter (Signed)
Pt is asking for a call from RN, she states she was told there is a concern for the amount of promethazine (PHENERGAN) 25 MG tablet  that she takes, she'd like a call to discuss. ?

## 2021-12-07 NOTE — Telephone Encounter (Signed)
I spoke to the patient. States she cancelled her appt 12/06/21 due to illness. She rescheduled the follow up. She wanted to let Judson Roch know she is taking much less medication now. The current amounts of almotriptan and promethazine are plentiful to last a month or even more.  ?

## 2021-12-08 ENCOUNTER — Ambulatory Visit: Payer: Medicare Other | Admitting: Neurology

## 2021-12-10 ENCOUNTER — Other Ambulatory Visit: Payer: Self-pay | Admitting: Cardiovascular Disease

## 2022-01-04 ENCOUNTER — Telehealth: Payer: Self-pay | Admitting: Neurology

## 2022-01-04 ENCOUNTER — Encounter: Payer: Self-pay | Admitting: Physical Medicine & Rehabilitation

## 2022-01-04 ENCOUNTER — Ambulatory Visit: Payer: Medicare Other | Admitting: Cardiology

## 2022-01-04 ENCOUNTER — Encounter: Payer: Medicare Other | Attending: Physical Medicine & Rehabilitation | Admitting: Physical Medicine & Rehabilitation

## 2022-01-04 VITALS — BP 153/84 | HR 103 | Ht 65.5 in | Wt 151.8 lb

## 2022-01-04 DIAGNOSIS — M797 Fibromyalgia: Secondary | ICD-10-CM | POA: Diagnosis present

## 2022-01-04 MED ORDER — ALMOTRIPTAN MALATE 12.5 MG PO TABS: 12.5000 mg | ORAL_TABLET | ORAL | 1 refills | Status: DC | PRN

## 2022-01-04 NOTE — Progress Notes (Signed)
Trigger Point Injection ?Myosfascial Pain ? ? ? ?After informed consent and preparation of the skin with isopropyl alcohol, I injected the left upper lumbar, left lower lumbar and right lumbar paraspinals each with 2cc of 1% lidocaine. The patient tolerated well, and no complications were experienced. Post-injection instructions were provided.  ? ?Her cervical/shoulder musculature was much less taut today. All muscles there felt soft/flexible.  ? ?She has f/u with [name] here on 01/19/22 ?

## 2022-01-04 NOTE — Telephone Encounter (Signed)
Patient has pending appt 05/30/22. Refills sent to pharmacy. ?

## 2022-01-04 NOTE — Telephone Encounter (Signed)
Pt request refill for almotriptan (AXERT) 12.5 MG tablet at [id number] ?

## 2022-01-04 NOTE — Patient Instructions (Addendum)
PLEASE FEEL FREE TO CALL OUR OFFICE WITH ANY PROBLEMS OR QUESTIONS ([phone]) ? ? ?KEEP WORKING ON YOUR POSTURE. YOU'RE DOING A GREAT JOB!   ? ?YOU CAN USE YOUR ABDOMINAL BINDER WHEN YOU ARE UP WALKING AROUND OR WHEN YOU ARE EXERCISING.  ? ? ?                ?

## 2022-01-10 ENCOUNTER — Ambulatory Visit: Payer: Medicare Other | Admitting: Neurology

## 2022-01-18 ENCOUNTER — Telehealth: Payer: Self-pay | Admitting: Neurology

## 2022-01-18 ENCOUNTER — Telehealth: Payer: Self-pay | Admitting: *Deleted

## 2022-01-18 ENCOUNTER — Encounter: Payer: Self-pay | Admitting: *Deleted

## 2022-01-18 ENCOUNTER — Other Ambulatory Visit: Payer: Self-pay | Admitting: Neurology

## 2022-01-18 ENCOUNTER — Telehealth: Payer: Self-pay | Admitting: Nurse Practitioner

## 2022-01-18 DIAGNOSIS — G894 Chronic pain syndrome: Secondary | ICD-10-CM

## 2022-01-18 DIAGNOSIS — M797 Fibromyalgia: Secondary | ICD-10-CM

## 2022-01-18 MED ORDER — HYDROCODONE-ACETAMINOPHEN 5-325 MG PO TABS: 1.0000 | ORAL_TABLET | Freq: Three times a day (TID) | ORAL | 0 refills | Status: DC | PRN

## 2022-01-18 MED ORDER — PROMETHAZINE HCL 25 MG PO TABS: ORAL_TABLET | ORAL | 0 refills | Status: DC

## 2022-01-18 NOTE — Telephone Encounter (Signed)
Patty Salas called and is not going to be able to come to tomorrow's appt as she is sick. She has rescheduled to 02/17/22 with [name].  She will need refills this month on her medication. ?

## 2022-01-18 NOTE — Telephone Encounter (Signed)
Please let her know that this sounds like a common GI virus which is going around. She can continue to take the imodium, I tablet every four hours as needed for loose stools. She should drink plenty of fluids and rest. Once symptoms start to resolve, she should start with a BRAT diet. This is bananas, rice, applesauce, and toast. She can advance as tolerated. With these kinds of illnesses, getting eerything out of the body is best, unless it goes of for longer than 48 hours without stop.  ?Thanks so much.   -HB

## 2022-01-18 NOTE — Telephone Encounter (Signed)
PMP was Reviewed ?Hydrocodone e-scribed today.  ?Patty Salas is aware of the above via office staff.  ?

## 2022-01-18 NOTE — Telephone Encounter (Signed)
Pending appt 06/13/22. Refill sent to pharmacy. Last filled for #30 on 11/22/21. ?

## 2022-01-18 NOTE — Telephone Encounter (Signed)
Patient called stating she has a temp of 99.8 and some chills. Also having a lot of diarrhea which she has been taking Imodium but it's not helping. She is asking if there is something that can be called in for her? Please advise. [phone] ?

## 2022-01-18 NOTE — Telephone Encounter (Signed)
Called pt LVM to contact the office °

## 2022-01-18 NOTE — Telephone Encounter (Signed)
Can ou find out how long the symptoms have been going on for and how much imodium she has taken? Thanks

## 2022-01-18 NOTE — Telephone Encounter (Signed)
Pt called needing a refill on her promethazine (PHENERGAN) 25 MG tablet [facility] [address]  ?

## 2022-01-19 ENCOUNTER — Encounter: Payer: Medicare Other | Admitting: Registered Nurse

## 2022-01-19 ENCOUNTER — Telehealth: Payer: Self-pay

## 2022-01-19 DIAGNOSIS — M797 Fibromyalgia: Secondary | ICD-10-CM

## 2022-01-19 DIAGNOSIS — G894 Chronic pain syndrome: Secondary | ICD-10-CM

## 2022-01-19 MED ORDER — HYDROCODONE-ACETAMINOPHEN 5-325 MG PO TABS: 1.0000 | ORAL_TABLET | Freq: Three times a day (TID) | ORAL | 0 refills | Status: DC | PRN

## 2022-01-19 NOTE — Telephone Encounter (Signed)
Received a fax from [place] Hydrocodone/APAP is on back order. [facility] and [facility] needs to [facility] sent to [place]. ?

## 2022-01-19 NOTE — Telephone Encounter (Signed)
PMP was reviewed.  ?Hydrocodone e-scribed today, [facility].  ?Prescription e-scribed to [facility], Ms. Bordenave is aware of the above.  ?

## 2022-01-19 NOTE — Telephone Encounter (Signed)
Called pt she is advised of what diet to follow and if not feeling better contact the office ?

## 2022-02-02 ENCOUNTER — Ambulatory Visit: Payer: Medicare Other | Admitting: Neurology

## 2022-02-06 ENCOUNTER — Telehealth: Payer: Self-pay

## 2022-02-06 NOTE — Telephone Encounter (Signed)
Returned her call. Left message with next appts  on 6/15, she does not need to be fasting.  Ask her to call the office back for questions. ?

## 2022-02-17 ENCOUNTER — Encounter: Payer: Medicare Other | Admitting: Registered Nurse

## 2022-02-21 ENCOUNTER — Ambulatory Visit: Payer: Medicare Other

## 2022-02-22 ENCOUNTER — Encounter: Payer: Medicare Other | Attending: Registered Nurse | Admitting: Registered Nurse

## 2022-02-22 VITALS — BP 142/86 | HR 104 | Temp 100.0°F | Ht 65.5 in | Wt 153.4 lb

## 2022-02-22 DIAGNOSIS — M7061 Trochanteric bursitis, right hip: Secondary | ICD-10-CM

## 2022-02-22 DIAGNOSIS — M5416 Radiculopathy, lumbar region: Secondary | ICD-10-CM

## 2022-02-22 DIAGNOSIS — M47812 Spondylosis without myelopathy or radiculopathy, cervical region: Secondary | ICD-10-CM | POA: Diagnosis not present

## 2022-02-22 DIAGNOSIS — M797 Fibromyalgia: Secondary | ICD-10-CM

## 2022-02-22 DIAGNOSIS — M546 Pain in thoracic spine: Secondary | ICD-10-CM

## 2022-02-22 DIAGNOSIS — M5412 Radiculopathy, cervical region: Secondary | ICD-10-CM

## 2022-02-22 DIAGNOSIS — I25750 Atherosclerosis of native coronary artery of transplanted heart with unstable angina: Secondary | ICD-10-CM

## 2022-02-22 DIAGNOSIS — M542 Cervicalgia: Secondary | ICD-10-CM | POA: Diagnosis not present

## 2022-02-22 DIAGNOSIS — G8929 Other chronic pain: Secondary | ICD-10-CM

## 2022-02-22 DIAGNOSIS — M7918 Myalgia, other site: Secondary | ICD-10-CM

## 2022-02-22 DIAGNOSIS — Z5181 Encounter for therapeutic drug level monitoring: Secondary | ICD-10-CM

## 2022-02-22 DIAGNOSIS — Z79891 Long term (current) use of opiate analgesic: Secondary | ICD-10-CM

## 2022-02-22 DIAGNOSIS — M7062 Trochanteric bursitis, left hip: Secondary | ICD-10-CM

## 2022-02-22 DIAGNOSIS — M654 Radial styloid tenosynovitis [de Quervain]: Secondary | ICD-10-CM

## 2022-02-22 DIAGNOSIS — G894 Chronic pain syndrome: Secondary | ICD-10-CM

## 2022-02-22 MED ORDER — METHYLPREDNISOLONE 4 MG PO TBPK: ORAL_TABLET | ORAL | 0 refills | Status: DC

## 2022-02-22 MED ORDER — HYDROCODONE-ACETAMINOPHEN 5-325 MG PO TABS: 1.0000 | ORAL_TABLET | Freq: Three times a day (TID) | ORAL | 0 refills | Status: DC | PRN

## 2022-02-22 NOTE — Progress Notes (Signed)
Subjective:    Patient ID: Patty Salas, female    DOB: [birth year], 66 y.o.   MRN: [id number]  HPI: Patty Salas is a 66 y.o. female whose appointment was changed to My-Chart Video visit, she called office reporting she was having IBS flare with diarrhea, vomiting and fever. Her appointment was changed to My-Chart Video Visit, we discussed  the limitations of evaluation and management by telemedicine and the availability of in person appointments. The patient expressed understanding and agreed to proceed..She states her pain is located in her neck radiating into her bilateral shoulders, mid- lower back pain radiating into her bilateral hips and bilateral lower extremities. She rates her pain 10. Her current exercise regime is walking and performing stretching exercises.  [name] Morphine equivalent is 12.31  MME.   Last UDS was Performed on 11/23/2021, it was consistent.    Pain Inventory Average Pain 10 Pain Right Now 10 My pain is sharp, burning, dull, stabbing, tingling, and aching  In the last 24 hours, has pain interfered with the following? General activity 10 Relation with others 10 Enjoyment of life 10 What TIME of day is your pain at its worst? morning , daytime, evening, and night Sleep (in general) Fair  Pain is worse with: walking, bending, sitting, inactivity, standing, unsure, and some activites Pain improves with: rest, heat/ice, therapy/exercise, pacing activities, medication, TENS, and injections Relief from Meds: 5  Family History  Problem Relation Age of Onset   Cancer Mother        stomach   Migraines Mother    Arthritis Mother    Emphysema Mother    Heart disease Father    Hypertension Father    Cancer Father        colon   Dementia Father    Hypertension Brother    Migraines Brother    Hypertension Brother    Migraines Brother    Alcohol abuse Brother    Bipolar disorder Brother    Migraines Daughter    Arthritis Maternal Grandmother    Cancer  Maternal Grandmother        stomach   Diabetes Maternal Grandmother    Stroke Maternal Grandmother    Cancer Maternal Aunt        lung   Emphysema Maternal Aunt    Cancer Paternal Uncle        colon   Hypertension Maternal Aunt    Heart disease Maternal Aunt    Cancer Paternal Uncle        lung   Social History   Socioeconomic History   Marital status: Married    Spouse name: Not on file   Number of children: 1   Years of education: COLLEGE1   Highest education level: Not on file  Occupational History   Occupation: HOUSEWIFE    Employer: UNEMPLOYED  Tobacco Use   Smoking status: Never   Smokeless tobacco: Never  Vaping Use   Vaping Use: Never used  Substance and Sexual Activity   Alcohol use: No   Drug use: No   Sexual activity: Not on file  Other Topics Concern   Not on file  Social History Narrative   Patient is right handed.   Patient drinks caffeine occasionally.   Social Determinants of Health   Financial Resource Strain: Not on file  Food Insecurity: Not on file  Transportation Needs: Not on file  Physical Activity: Not on file  Stress: Not on file  Social Connections: Not on file  Past Surgical History:  Procedure Laterality Date    kidney stones     ABDOMINAL ADHESION SURGERY     ABDOMINAL HYSTERECTOMY  1995   partial- hemmoraged after surgery-stitch came loose   APPENDECTOMY  1993   hemmoraged after surgery- stitch came loose   CERVICAL DISC SURGERY  06/04/2001   with fusion   [place]   colonoscopy     COLONOSCOPY     CYSTOSCOPY     FOOT SURGERY     lt   INCISIONAL HERNIA REPAIR N/A 04/22/2021   Procedure: LAPAROSCOPIC INCISIONAL HERNIA REPAIR WITH MESH;  Surgeon: Clovis Riley, MD;  Location: [facility];  Service: General;  Laterality: N/A;   jj stent  07/2002   with ureteroscopy, cystoscopy and then removal of stent in office   LAPAROSCOPIC LYSIS OF ADHESIONS N/A 04/22/2021   Procedure: LYSIS OF ADHESIONS;  Surgeon: Clovis Riley, MD;  Location: [facility];  Service: General;  Laterality: N/A;   LITHOTRIPSY     LYMPH NODE BIOPSY Right 03/06/2014   Procedure: right groin LYMPH NODE BIOPSY;  Surgeon: Merrie Roof, MD;  Location: [facility];  Service: General;  Laterality: Right;   LYMPHADENECTOMY N/A 12/29/2015   Procedure: RETROPERITONEAL LYMPHADENECTOMY;  Surgeon: Alexis Frock, MD;  [facility]: [facility];  Service: Urology;  Laterality: N/A;   NECK SURGERY  2002   ROBOT ASSISTED LAPAROSCOPIC NEPHRECTOMY Left 12/29/2015   Procedure: XI ROBOTIC ASSISTED LEFT LAPAROSCOPIC NEPHRECTOMY;  Surgeon: Alexis Frock, MD;  [facility];  Service: Urology;  Laterality: Left;   TONSILLECTOMY  1976   Past Surgical History:  Procedure Laterality Date    kidney stones     ABDOMINAL ADHESION SURGERY     ABDOMINAL HYSTERECTOMY  1995   partial- hemmoraged after surgery-stitch came loose   APPENDECTOMY  1993   hemmoraged after surgery- stitch came loose   CERVICAL DISC SURGERY  06/04/2001   with fusion   [place]   colonoscopy     COLONOSCOPY     CYSTOSCOPY     FOOT SURGERY     lt   INCISIONAL HERNIA REPAIR N/A 04/22/2021   Procedure: LAPAROSCOPIC INCISIONAL HERNIA REPAIR WITH MESH;  Surgeon: Clovis Riley, MD;  [facility];  Service: General;  Laterality: N/A;   jj stent  07/2002   with ureteroscopy, cystoscopy and then removal of stent in office   LAPAROSCOPIC LYSIS OF ADHESIONS N/A 04/22/2021   Procedure: LYSIS OF ADHESIONS;  Surgeon: Clovis Riley, MD;  [facility];  Service: General;  Laterality: N/A;   LITHOTRIPSY     LYMPH NODE BIOPSY Right 03/06/2014   Procedure: right groin LYMPH NODE BIOPSY;  Surgeon: Merrie Roof, MD;  Location: [facility];  Service: General;  Laterality: Right;   LYMPHADENECTOMY N/A 12/29/2015   Procedure: RETROPERITONEAL LYMPHADENECTOMY;  Surgeon: Alexis Frock, MD;  [facility];  Service: Urology;  Laterality: N/A;   NECK  SURGERY  2002   ROBOT ASSISTED LAPAROSCOPIC NEPHRECTOMY Left 12/29/2015   Procedure: XI ROBOTIC ASSISTED LEFT LAPAROSCOPIC NEPHRECTOMY;  Surgeon: Alexis Frock, MD;  [facility];  Service: Urology;  Laterality: Left;   TONSILLECTOMY  1976   Past Medical History:  Diagnosis Date   Anxiety    Arthritis    Asthma    Bipolar 2 disorder ([place])    pt stated, "I have Bipolar 2 and it is remission"   Bipolar affective ([place])  CAD (coronary artery disease), native artery transplanted heart    Nonobstructive by coronary CTA 08/2021 with less than 25% stenosis in the LAD, RCA and left circumflex.   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   Full dentures    Gout    Headache(784.0)    migraines   Herpesviral infection    Hypertension    IBS (irritable bowel syndrome)    with diarrhea   Lymphadenopathy    Myalgia and myositis, unspecified    PAT (paroxysmal atrial tachycardia) (HCC)    asymptomatic 10 beat run on event monitor 09/2020   PFO (patent foramen ovale)    Small PFO noted on coronary CTA   Pneumonia    PONV (postoperative nausea and vomiting)    PTSD (post-traumatic stress disorder)    Renal calculi    Restless legs syndrome (RLS)    Thoracic radiculopathy    Vitamin D deficiency    Wears glasses    There were no vitals taken for this visit.  Opioid Risk Score:   Fall Risk Score:  `1  Depression screen PHQ 2/9     01/04/2022    9:20 AM 11/23/2021    1:05 PM 10/19/2021    1:29 PM 10/05/2021    9:16 AM 09/06/2021   11:11 AM 08/19/2021   12:46 PM 06/28/2021   12:59 PM  Depression screen PHQ 2/9  Decreased Interest 0 1 0 1 0 1 1  Down, Depressed, Hopeless 0 1 0 0 0 1 1  PHQ - 2 Score 0 2 0 1 0 2 2  Altered sleeping   0 0 0     Tired, decreased energy   '1 1 1    '$ Change in appetite   0 1 0    Feeling bad or failure about yourself    0 0 0    Trouble concentrating   1 1 0    Moving slowly or fidgety/restless   0 0 1    Suicidal thoughts   0 0 0    PHQ-9 Score   '2 4 2       '$ Review of Systems  Musculoskeletal:  Positive for myalgias.  Neurological:  Positive for tremors, weakness and numbness.  All other systems reviewed and are negative.     Objective:   Physical Exam Vitals and nursing note reviewed.  Musculoskeletal:     Comments: No Physical Exam Performed : My-Chart Video Visit         Assessment & Plan:  1 History of fibromyalgia with myofascial pain and multiple trigger points.Continue with Heat and exercise Regime. Continue with current medication regimen with  gabapentin. 05/242023 2 Chronic migraine headaches.Continue to Monitor. S/P Botox.on 05/30/2017. 02/22/2022 3. Midline Low Back Pain/ Lumbar Spondylosis/Lumbar degenerative disk disease, L4-5/ Lumbar Radiculitis: Continue with HEP and current medication regimen. Continue Hydrocodone 5/325 mg one tablet every 8 hours as needed #80. Marland KitchenContinue with slow weaning of Hydrocodone. Second script sent for the following month to accommodate scheduled appointment . 02/22/2022 4. 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 Urology Following. 02/22/2022. 5. 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 02/22/2022 6. Cervicalgia/ Cervical RadiculitisCervical Spondylosis:  Continue current medication  regiment with  Gabapentin. Continue  with HEP and Continue to monitor. 02/22/2022 7. Muscle Spasm: Myofascial Pain: She is svcheduled for Trigger Point Injection with Dr Naaman Plummer : Continue current medication regimen with Flexeril as needed. 02/22/2022 8. Bilateral Greater Trochanter Bursitis: Continue to alternate with ice and heat therapy. Continue HEP as Tolerated. Continue to monitor. 02/22/2022 9. Bilateral Knee Pain: S/P Knee  Injection by Dr Sharol Given on 02/23/2020. Orthopedics Following. Continue to Monitor. 02/22/2022    F/U in 1 month  My-Chart Video Visit Establish Patient Location Of Patient In Her Home Location Of Provider In the Office

## 2022-02-23 ENCOUNTER — Telehealth: Payer: Self-pay

## 2022-02-23 NOTE — Telephone Encounter (Signed)
edited

## 2022-02-23 NOTE — Telephone Encounter (Signed)
Patty Salas has requested her allergy list to be updated.  Per Patty Salas the Cymbalta / Duloxetine reaction should be dated 11/2005 not 2017. Can  you please correct it for her? She has asked several times for this correction.    Thank you.

## 2022-02-24 ENCOUNTER — Telehealth: Payer: Self-pay | Admitting: Nurse Practitioner

## 2022-02-24 ENCOUNTER — Telehealth: Payer: Self-pay

## 2022-02-24 NOTE — Telephone Encounter (Signed)
Patient called stating she was having diarrhea and vomiting and wants to know if she can still take the immodium? She states the diarrhea has gotten better but not completely stopped. She was also given a medrol dose pack but wanted to ask you if she's ok to take it? Please advise.

## 2022-02-24 NOTE — Telephone Encounter (Signed)
Returned patient's call regarding questions related to new prescription of 6 day medrol dose pack. Patient informed that the pack should not affect the results of her labs scheduled on 6/15. Patient noted additional symptoms, but denied offer to have someone see her sooner. She affirmed that symptoms are "not urgent" and would like to address them with Dr. Alvy Bimler on 6/15.  Patient knows to call if she has any additional questions, concerns, or if she would like to be seen sooner.

## 2022-02-24 NOTE — Telephone Encounter (Signed)
Called pt she is advised of what to take for diarrhea

## 2022-02-27 ENCOUNTER — Encounter: Payer: Self-pay | Admitting: Registered Nurse

## 2022-02-28 ENCOUNTER — Telehealth: Payer: Self-pay

## 2022-02-28 NOTE — Telephone Encounter (Signed)
Called and told her per Dr. Alvy Bimler, lab work on 6/15 will be okay as long as she finishes medrol dose pack 7 days prior to lab appt.  Offered earlier appt and she declined earlier appt. Told her to follow up with PCP if needed. She verbalized understanding.

## 2022-03-09 ENCOUNTER — Other Ambulatory Visit: Payer: Self-pay | Admitting: Neurology

## 2022-03-16 ENCOUNTER — Inpatient Hospital Stay: Payer: Medicare Other

## 2022-03-16 ENCOUNTER — Telehealth: Payer: Self-pay

## 2022-03-16 ENCOUNTER — Inpatient Hospital Stay: Payer: Medicare Other | Admitting: Hematology and Oncology

## 2022-03-16 NOTE — Telephone Encounter (Signed)
She called and left a message to cancel today's appts. She woke up with a migraine and will not be able to come today. Appts canceled.

## 2022-03-17 ENCOUNTER — Telehealth: Payer: Self-pay

## 2022-03-17 NOTE — Telephone Encounter (Signed)
She called and left a message to rescheduled missed appts. Sent a message to scheduler to reschedule appts.

## 2022-03-17 NOTE — Telephone Encounter (Signed)
Returned her call from earlier wanting to reschedule missed appts. Left a message that a scheduling message has been sent to reschedule the missed appts.

## 2022-03-20 ENCOUNTER — Telehealth: Payer: Self-pay | Admitting: Hematology and Oncology

## 2022-03-20 NOTE — Telephone Encounter (Signed)
Scheduled per 6/19 in basket, pt confirmed appt

## 2022-03-23 ENCOUNTER — Inpatient Hospital Stay: Payer: Medicare Other

## 2022-03-23 ENCOUNTER — Inpatient Hospital Stay: Payer: Medicare Other | Admitting: Hematology and Oncology

## 2022-03-23 ENCOUNTER — Encounter: Payer: Self-pay | Admitting: Registered Nurse

## 2022-03-23 ENCOUNTER — Encounter: Payer: Medicare Other | Attending: Physical Medicine & Rehabilitation | Admitting: Registered Nurse

## 2022-03-23 VITALS — BP 151/81 | HR 99 | Ht 65.5 in | Wt 156.6 lb

## 2022-03-23 DIAGNOSIS — M7918 Myalgia, other site: Secondary | ICD-10-CM | POA: Diagnosis present

## 2022-03-23 DIAGNOSIS — M542 Cervicalgia: Secondary | ICD-10-CM | POA: Diagnosis present

## 2022-03-23 DIAGNOSIS — M546 Pain in thoracic spine: Secondary | ICD-10-CM | POA: Diagnosis present

## 2022-03-23 DIAGNOSIS — M7062 Trochanteric bursitis, left hip: Secondary | ICD-10-CM | POA: Insufficient documentation

## 2022-03-23 DIAGNOSIS — G8929 Other chronic pain: Secondary | ICD-10-CM | POA: Diagnosis present

## 2022-03-23 DIAGNOSIS — R1032 Left lower quadrant pain: Secondary | ICD-10-CM

## 2022-03-23 DIAGNOSIS — M7061 Trochanteric bursitis, right hip: Secondary | ICD-10-CM | POA: Diagnosis present

## 2022-03-23 DIAGNOSIS — M5412 Radiculopathy, cervical region: Secondary | ICD-10-CM | POA: Diagnosis present

## 2022-03-23 DIAGNOSIS — Z5181 Encounter for therapeutic drug level monitoring: Secondary | ICD-10-CM

## 2022-03-23 DIAGNOSIS — M797 Fibromyalgia: Secondary | ICD-10-CM | POA: Diagnosis present

## 2022-03-23 DIAGNOSIS — M47812 Spondylosis without myelopathy or radiculopathy, cervical region: Secondary | ICD-10-CM

## 2022-03-23 DIAGNOSIS — I25750 Atherosclerosis of native coronary artery of transplanted heart with unstable angina: Secondary | ICD-10-CM

## 2022-03-23 DIAGNOSIS — M5416 Radiculopathy, lumbar region: Secondary | ICD-10-CM

## 2022-03-23 DIAGNOSIS — G894 Chronic pain syndrome: Secondary | ICD-10-CM

## 2022-03-23 DIAGNOSIS — Z79891 Long term (current) use of opiate analgesic: Secondary | ICD-10-CM | POA: Diagnosis present

## 2022-03-23 MED ORDER — HYDROCODONE-ACETAMINOPHEN 5-325 MG PO TABS: 1.0000 | ORAL_TABLET | Freq: Three times a day (TID) | ORAL | 0 refills | Status: DC | PRN

## 2022-03-23 NOTE — Progress Notes (Signed)
Subjective:    Patient ID: Patty Salas, female    DOB: Oct 19, 1955, 66 y.o.   MRN: [id number]  HPI: Patty Salas is a 66 y.o. female who returns for follow up appointment for chronic pain and medication refill. She states her pain is located in her neck radiating into her bilateral shoulders, mid- lower back pain radiating into her bilateral lower extremities and bilateral hip pain. She also reports right wrist pain wearing splint and left groin pain. She reports oncology following left groin pain. She rates her pain 10. Her current exercise regime is walking and performing stretching exercises.  [name] Morphine equivalent is 15.00 MME.She  is also prescribed Lorazepam  by Dr. Reece Levy .We have discussed the black box warning of using opioids and benzodiazepines. I highlighted the dangers of using these drugs together and discussed the adverse events including respiratory suppression, overdose, cognitive impairment and importance of compliance with current regimen. We will continue to monitor and adjust as indicated.  she is being closely monitored and under the care of her psychiatrist.  UDS ordered today.       Pain Inventory Average Pain 10 Pain Right Now 10 My pain is constant, sharp, burning, stabbing, tingling, and aching  In the last 24 hours, has pain interfered with the following? General activity 9 Relation with others 10 Enjoyment of life 10 What TIME of day is your pain at its worst? morning , daytime, evening, and night Sleep (in general) Fair  Pain is worse with: walking, bending, sitting, inactivity, standing, and some activites Pain improves with: rest, heat/ice, therapy/exercise, medication, and injections Relief from Meds: 4  Family History  Problem Relation Age of Onset   Cancer Mother        stomach   Migraines Mother    Arthritis Mother    Emphysema Mother    Heart disease Father    Hypertension Father    Cancer Father        colon   Dementia Father     Hypertension Brother    Migraines Brother    Hypertension Brother    Migraines Brother    Alcohol abuse Brother    Bipolar disorder Brother    Migraines Daughter    Arthritis Maternal Grandmother    Cancer Maternal Grandmother        stomach   Diabetes Maternal Grandmother    Stroke Maternal Grandmother    Cancer Maternal Aunt        lung   Emphysema Maternal Aunt    Cancer Paternal Uncle        colon   Hypertension Maternal Aunt    Heart disease Maternal Aunt    Cancer Paternal Uncle        lung   Social History   Socioeconomic History   Marital status: Married    Spouse name: Not on file   Number of children: 1   Years of education: COLLEGE1   Highest education level: Not on file  Occupational History   Occupation: HOUSEWIFE    Employer: UNEMPLOYED  Tobacco Use   Smoking status: Never   Smokeless tobacco: Never  Vaping Use   Vaping Use: Never used  Substance and Sexual Activity   Alcohol use: No   Drug use: No   Sexual activity: Not on file  Other Topics Concern   Not on file  Social History Narrative   Patient is right handed.   Patient drinks caffeine occasionally.   Social Determinants of  Health   Financial Resource Strain: Not on file  Food Insecurity: Not on file  Transportation Needs: Not on file  Physical Activity: Not on file  Stress: Not on file  Social Connections: Not on file   Past Surgical History:  Procedure Laterality Date    kidney stones     ABDOMINAL ADHESION SURGERY     ABDOMINAL HYSTERECTOMY  1995   partial- hemmoraged after surgery-stitch came loose   APPENDECTOMY  1993   hemmoraged after surgery- stitch came loose   CERVICAL DISC SURGERY  06/04/2001   with fusion   [place]   colonoscopy     COLONOSCOPY     CYSTOSCOPY     FOOT SURGERY     lt   INCISIONAL HERNIA REPAIR N/A 04/22/2021   Procedure: LAPAROSCOPIC INCISIONAL HERNIA REPAIR WITH MESH;  Surgeon: Clovis Riley, MD;  [facility];  Service:  General;  Laterality: N/A;   jj stent  07/2002   with ureteroscopy, cystoscopy and then removal of stent in office   LAPAROSCOPIC LYSIS OF ADHESIONS N/A 04/22/2021   Procedure: LYSIS OF ADHESIONS;  Surgeon: Clovis Riley, MD;  [facility];  Service: General;  Laterality: N/A;   LITHOTRIPSY     LYMPH NODE BIOPSY Right 03/06/2014   Procedure: right groin LYMPH NODE BIOPSY;  Surgeon: Merrie Roof, MD;  Location: [facility];  Service: General;  Laterality: Right;   LYMPHADENECTOMY N/A 12/29/2015   Procedure: RETROPERITONEAL LYMPHADENECTOMY;  Surgeon: Alexis Frock, MD;  [facility];  Service: Urology;  Laterality: N/A;   NECK SURGERY  2002   ROBOT ASSISTED LAPAROSCOPIC NEPHRECTOMY Left 12/29/2015   Procedure: XI ROBOTIC ASSISTED LEFT LAPAROSCOPIC NEPHRECTOMY;  Surgeon: Alexis Frock, MD;  [facility];  Service: Urology;  Laterality: Left;   TONSILLECTOMY  1976   Past Surgical History:  Procedure Laterality Date    kidney stones     ABDOMINAL ADHESION SURGERY     ABDOMINAL HYSTERECTOMY  1995   partial- hemmoraged after surgery-stitch came loose   APPENDECTOMY  1993   hemmoraged after surgery- stitch came loose   CERVICAL DISC SURGERY  06/04/2001   with fusion   [place]   colonoscopy     COLONOSCOPY     CYSTOSCOPY     FOOT SURGERY     lt   INCISIONAL HERNIA REPAIR N/A 04/22/2021   Procedure: LAPAROSCOPIC INCISIONAL HERNIA REPAIR WITH MESH;  Surgeon: Clovis Riley, MD;  [facility];  Service: General;  Laterality: N/A;   jj stent  07/2002   with ureteroscopy, cystoscopy and then removal of stent in office   LAPAROSCOPIC LYSIS OF ADHESIONS N/A 04/22/2021   Procedure: LYSIS OF ADHESIONS;  Surgeon: Clovis Riley, MD;  [facility];  Service: General;  Laterality: N/A;   LITHOTRIPSY     LYMPH NODE BIOPSY Right 03/06/2014   Procedure: right groin LYMPH NODE BIOPSY;  Surgeon: Merrie Roof, MD;  Location: [facility];  Service: General;  Laterality: Right;   LYMPHADENECTOMY N/A 12/29/2015   Procedure: RETROPERITONEAL LYMPHADENECTOMY;  Surgeon: Alexis Frock, MD;  [facility];  Service: Urology;  Laterality: N/A;   NECK SURGERY  2002   ROBOT ASSISTED LAPAROSCOPIC NEPHRECTOMY Left 12/29/2015   Procedure: XI ROBOTIC ASSISTED LEFT LAPAROSCOPIC NEPHRECTOMY;  Surgeon: Alexis Frock, MD;  [facility];  Service: Urology;  Laterality: Left;   TONSILLECTOMY  1976   Past Medical History:  Diagnosis Date   Anxiety    Arthritis    Asthma    Bipolar 2 disorder ([place])    pt stated, "I have Bipolar 2 and it is remission"   Bipolar affective ([place])    CAD (coronary artery disease), native artery transplanted heart    Nonobstructive by coronary CTA 08/2021 with less than 25% stenosis in the LAD, RCA and left circumflex.   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   Full dentures    Gout    Headache(784.0)    migraines   Herpesviral infection    Hypertension    IBS (irritable bowel syndrome)    with diarrhea   Lymphadenopathy    Myalgia and myositis, unspecified    PAT (paroxysmal atrial tachycardia) (HCC)    asymptomatic 10 beat run on event monitor 09/2020   PFO (patent foramen ovale)    Small PFO noted on coronary CTA   Pneumonia    PONV (postoperative nausea and vomiting)    PTSD (post-traumatic stress disorder)    Renal calculi    Restless legs syndrome (RLS)    Thoracic radiculopathy    Vitamin D deficiency    Wears glasses    There were no vitals taken for this visit.  Opioid Risk Score:   Fall Risk Score:  `1  Depression screen PHQ 2/9     01/04/2022    9:20 AM 11/23/2021    1:05 PM 10/19/2021    1:29 PM 10/05/2021    9:16 AM 09/06/2021    11:11 AM 08/19/2021   12:46 PM 06/28/2021   12:59 PM  Depression screen PHQ 2/9  Decreased Interest 0 1 0 1 0 1 1  Down, Depressed, Hopeless 0 1 0 0 0 1 1  PHQ - 2 Score 0 2 0 1 0 2 2  Altered sleeping   0 0 0    Tired, decreased energy   '1 1 1    '$ Change in appetite   0 1 0    Feeling bad or failure about yourself    0 0 0    Trouble concentrating   1 1 0    Moving slowly or fidgety/restless   0 0 1    Suicidal thoughts   0 0 0    PHQ-9 Score   '2 4 2      '$ Review of Systems  Musculoskeletal:  Positive for arthralgias, back pain, gait problem, myalgias and neck pain.  All other systems reviewed and are negative.      Objective:   Physical Exam Vitals and nursing note reviewed.  Constitutional:      Appearance: Normal appearance.  Neck:     Comments: Cervical Paraspinal Tenderness: C-5-C-6 Cardiovascular:     Rate and Rhythm: Normal rate and regular rhythm.     Pulses: Normal pulses.     Heart sounds: Normal heart sounds.  Pulmonary:     Effort: Pulmonary effort is normal.     Breath sounds: Normal breath sounds.  Musculoskeletal:     Cervical back: Normal range of motion and neck supple.     Comments: Normal Muscle Bulk and Muscle Testing Reveals:  Upper Extremities: Full ROM and Muscle Strength  5/5 Bilateral AC Joint Tenderness  Thoracic Paraspinal Tenderness: T-7-T-9 Lumbar Paraspinal Tenderness: L-3-L-5 Bilateral Greater Trochanter Tenderness Lower Extremities: Full ROM and Muscle Strength 5/5 Arises from Table slowly using walker for support Narrow Based  Gait     Skin:    General: Skin is warm and dry.  Neurological:     Mental Status: She is alert and oriented to person, place, and time.  Psychiatric:        Mood and Affect: Mood normal.        Behavior: Behavior normal.         Assessment & Plan:  1 History of fibromyalgia with myofascial pain and multiple trigger points.Continue with Heat and exercise Regime. Continue with current medication regimen  with  gabapentin. 06/222023 2 Chronic migraine headaches.Continue to Monitor. S/P Botox.on 05/30/2017. 03/23/2022 3. Midline Low Back Pain/ Lumbar Spondylosis/Lumbar degenerative disk disease, L4-5/ Lumbar Radiculitis: Continue with HEP and current medication regimen. Continue Hydrocodone 5/325 mg one tablet every 8 hours as needed #80. Marland KitchenContinue with slow weaning of Hydrocodone. Second script sent for the following month to accommodate scheduled appointment . 03/23/2022 4. 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 Urology Following. 03/23/2022. 5. 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 03/23/2022 6. Cervicalgia/ Cervical RadiculitisCervical Spondylosis:  Continue current medication regiment with  Gabapentin. Continue  with HEP and Continue to monitor. 03/23/2022 7. Muscle Spasm: Myofascial Pain: She is svcheduled for Trigger Point Injection with Dr Naaman Plummer : Continue current medication regimen with Flexeril as needed. 03/23/2022 8. Bilateral Greater Trochanter Bursitis: Continue to alternate with ice and heat therapy. Continue HEP as Tolerated. Continue to monitor. 03/23/2022 9. Bilateral Knee Pain: S/P Knee Injection by Dr Sharol Given on 02/23/2020. Orthopedics Following. Continue to Monitor. 03/23/2022  10. Left Groin Pain: Ms. Thibeaux reports Oncology Following. Continue to Monitor. 03/23/2022.    F/U in 1 month

## 2022-03-28 ENCOUNTER — Telehealth: Payer: Self-pay | Admitting: *Deleted

## 2022-03-28 LAB — TOXASSURE SELECT,+ANTIDEPR,UR

## 2022-03-30 ENCOUNTER — Telehealth: Payer: Self-pay

## 2022-03-30 ENCOUNTER — Other Ambulatory Visit: Payer: Self-pay

## 2022-03-30 ENCOUNTER — Inpatient Hospital Stay: Payer: Medicare Other | Attending: Hematology and Oncology

## 2022-03-30 ENCOUNTER — Inpatient Hospital Stay (HOSPITAL_BASED_OUTPATIENT_CLINIC_OR_DEPARTMENT_OTHER): Payer: Medicare Other | Admitting: Hematology and Oncology

## 2022-03-30 ENCOUNTER — Encounter: Payer: Self-pay | Admitting: Hematology and Oncology

## 2022-03-30 DIAGNOSIS — R1013 Epigastric pain: Secondary | ICD-10-CM | POA: Diagnosis not present

## 2022-03-30 DIAGNOSIS — Z8639 Personal history of other endocrine, nutritional and metabolic disease: Secondary | ICD-10-CM | POA: Diagnosis not present

## 2022-03-30 DIAGNOSIS — Z85528 Personal history of other malignant neoplasm of kidney: Secondary | ICD-10-CM

## 2022-03-30 DIAGNOSIS — E559 Vitamin D deficiency, unspecified: Secondary | ICD-10-CM

## 2022-03-30 DIAGNOSIS — C8206 Follicular lymphoma grade I, intrapelvic lymph nodes: Secondary | ICD-10-CM

## 2022-03-30 DIAGNOSIS — R109 Unspecified abdominal pain: Secondary | ICD-10-CM | POA: Insufficient documentation

## 2022-03-30 LAB — CBC WITH DIFFERENTIAL/PLATELET
Abs Immature Granulocytes: 0.05 10*3/uL (ref 0.00–0.07)
Basophils Absolute: 0.1 10*3/uL (ref 0.0–0.1)
Basophils Relative: 1 %
Eosinophils Absolute: 0.1 10*3/uL (ref 0.0–0.5)
Eosinophils Relative: 2 %
HCT: 37.2 % (ref 36.0–46.0)
Hemoglobin: 12.2 g/dL (ref 12.0–15.0)
Immature Granulocytes: 1 %
Lymphocytes Relative: 22 %
Lymphs Abs: 1.2 10*3/uL (ref 0.7–4.0)
MCH: 31.4 pg (ref 26.0–34.0)
MCHC: 32.8 g/dL (ref 30.0–36.0)
MCV: 95.6 fL (ref 80.0–100.0)
Monocytes Absolute: 0.6 10*3/uL (ref 0.1–1.0)
Monocytes Relative: 10 %
Neutro Abs: 3.4 10*3/uL (ref 1.7–7.7)
Neutrophils Relative %: 64 %
Platelets: 282 10*3/uL (ref 150–400)
RBC: 3.89 MIL/uL (ref 3.87–5.11)
RDW: 12.8 % (ref 11.5–15.5)
WBC: 5.4 10*3/uL (ref 4.0–10.5)
nRBC: 0 % (ref 0.0–0.2)

## 2022-03-30 LAB — COMPREHENSIVE METABOLIC PANEL
ALT: 14 U/L (ref 0–44)
AST: 28 U/L (ref 15–41)
Albumin: 4.4 g/dL (ref 3.5–5.0)
Alkaline Phosphatase: 73 U/L (ref 38–126)
Anion gap: 6 (ref 5–15)
BUN: 11 mg/dL (ref 8–23)
CO2: 36 mmol/L — ABNORMAL HIGH (ref 22–32)
Calcium: 10 mg/dL (ref 8.9–10.3)
Chloride: 98 mmol/L (ref 98–111)
Creatinine, Ser: 0.88 mg/dL (ref 0.44–1.00)
GFR, Estimated: >60 mL/min (ref >60)
Glucose, Bld: 85 mg/dL (ref 70–99)
Potassium: 3.6 mmol/L (ref 3.5–5.1)
Sodium: 140 mmol/L (ref 135–145)
Total Bilirubin: 0.4 mg/dL (ref 0.3–1.2)
Total Protein: 8 g/dL (ref 6.5–8.1)

## 2022-03-30 LAB — VITAMIN D 25 HYDROXY (VIT D DEFICIENCY, FRACTURES): Vit D, 25-Hydroxy: 33.38 ng/mL (ref 30–100)

## 2022-03-30 LAB — LACTATE DEHYDROGENASE: LDH: 188 U/L (ref 98–192)

## 2022-03-30 NOTE — Telephone Encounter (Signed)
Called and given below message. She verbalized understanding. 

## 2022-03-30 NOTE — Progress Notes (Signed)
[facility] OFFICE PROGRESS NOTE  Patient Care Team: Ronnell Freshwater, NP as PCP - General (Family Medicine) Richmond Campbell, MD as Consulting Physician (Gastroenterology) Festus Aloe, MD as Consulting Physician (Urology) Alexis Frock, MD as Consulting Physician (Urology) Bayard Hugger, NP as Nurse Practitioner (Physical Medicine and Rehabilitation) Meredith Staggers, MD as Consulting Physician (Physical Medicine and Rehabilitation) Kathrynn Ducking, MD (Inactive) as Consulting Physician (Neurology) Regal, Tamala Fothergill, DPM as Consulting Physician (Podiatry) Ricard Dillon, MD (Psychiatry) Prueter, Gustavo Lah (Physical Medicine and Rehabilitation) Sueanne Margarita, MD as Consulting Physician (Cardiology) Newt Minion, MD as Consulting Physician (Orthopedic Surgery) Calton Dach, MD as Referring Physician (Optometry) Heath Lark, MD as Consulting Physician (Hematology and Oncology)  ASSESSMENT & PLAN:  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   All questions were answered. The patient knows to call the clinic with any problems, questions or concerns. The total time spent in the appointment was 20 minutes encounter with patients including review of chart and various tests results, discussions about plan of care and coordination of care plan   Heath Lark, MD 03/30/2022 11:58 AM  INTERVAL HISTORY: Please see below for problem  oriented charting. she returns for surveillance follow-up follow-up follicular lymphoma She is doing well She continues to have chronic intermittent abdominal pain No recent fever or chills No new lymphadenopathy  REVIEW OF SYSTEMS:   Constitutional: Denies fevers, chills or abnormal weight loss Eyes: Denies blurriness of vision Ears, nose, mouth, throat, and face: Denies mucositis or sore throat Respiratory: Denies cough, dyspnea or wheezes Cardiovascular: Denies palpitation, chest discomfort or lower extremity swelling Gastrointestinal:  Denies nausea, heartburn or change in bowel habits Skin: Denies abnormal skin rashes Lymphatics: Denies new lymphadenopathy or easy bruising Neurological:Denies numbness, tingling or new weaknesses Behavioral/Psych: Mood is stable, no new changes  All other systems were reviewed with the patient and are negative.  I have reviewed the past medical history, past surgical history, social history and family history with the patient and they are unchanged from previous note.  ALLERGIES:  is allergic to aripiprazole, aspirin, chlorpromazine, cymbalta [duloxetine hcl], divalproex sodium, duloxetine, duract [bromfenac], flurazepam, hydrochlorothiazide, imitrex [sumatriptan succinate], latex, mellaril, metoclopramide, olanzapine, olanzapine, penicillins, quetiapine, statins, sumatriptan, thioridazine, topamax, topiramate, trifluoperazine, aripiprazole, dalmane [flurazepam hcl], darifenacin hydrobromide er, metoclopramide hcl, other, seroquel [quetiapine fumerate], stelazine, thorazine [chlorpromazine hcl], allopurinol, aspirin effervescent, barium sulfate, benzocaine, biofreeze [menthol (topical analgesic)], capsaicin, clindamycin, clindamycin/lincomycin, clove oil, colchicine, cycloheximide, darifenacin, diatrizoate, diflucan [fluconazole], erythromycin, garlic, iohexol, ivp dye [iodinated contrast media], metronidazole, mirabegron, miralax [polyethylene glycol],  moxifloxacin, nsaids, oxybutynin, polyethylene glycol 3350, potassium chloride, potassium-containing compounds, prednisone, psyllium, red dye, seroquel [quetiapine fumarate], simvastatin, urocit - k [potassium citrate], avelox [moxifloxacin hcl in nacl], barium-containing compounds, butalbital-aspirin-caffeine, diclofenac, doxycycline, e-mycin [erythromycin base], flagyl [metronidazole hcl], lamictal [lamotrigine], pregabalin, pregabalin, risperidone, risperidone, and toradol [ketorolac tromethamine].  MEDICATIONS:  Current Outpatient Medications  Medication Sig Dispense Refill   acetaminophen (TYLENOL) 500 MG tablet Take 500 mg by mouth  every 6 (six) hours as needed for moderate pain.     albuterol (VENTOLIN HFA) 108 (90 Base) MCG/ACT inhaler Inhale 2 puffs into the lungs every 6 (six) hours as needed for wheezing. 18 g 1   almotriptan (AXERT) 12.5 MG tablet Take 1 tablet (12.5 mg total) by mouth as needed for migraine. TAKE 1 TABLET BY MOUTH AS NEEDED FOR MIGRAINE (MAX 2 TABS PER WEEK). THIS IS 90-DAY RX. PAYS CASH 24 tablet 1   bisacodyl (DULCOLAX) 5 MG EC tablet Take 10 mg by mouth daily as needed for mild constipation.      cyclobenzaprine (FLEXERIL) 10 MG tablet Take 1 tablet (10 mg total) by mouth 3 (three) times daily. 90 tablet 4   docusate sodium (COLACE) 100 MG capsule Take 100 mg by mouth daily as needed for mild constipation.      EPINEPHRINE 0.3 mg/0.3 mL IJ SOAJ injection INJECT 0.3 MG INTO THE MUSCLE AS NEEDED FOR ANAPHYLAXIS. 2 each 0   ergocalciferol (DRISDOL) 1.25 MG (50000 UT) capsule Take 1 capsule (50,000 Units total) by mouth once a week. (Patient not taking: Reported on 03/23/2022) 4 capsule 5   fexofenadine (ALLEGRA) 180 MG tablet Take 1 tablet (180 mg total) by mouth daily. 30 tablet 0   furosemide (LASIX) 20 MG tablet Take 1 tablet (20 mg total) by mouth daily as needed for edema. 90 tablet 3   gabapentin (NEURONTIN) 300 MG capsule Take 600 mg by mouth at bedtime.      HYDROcodone-acetaminophen (NORCO/VICODIN) 5-325 MG tablet Take 1 tablet by mouth every 8 (eight) hours as needed for moderate pain. 80 tablet 0   hyoscyamine (ANASPAZ) 0.125 MG TBDP disintergrating tablet hyoscyamine 0.125 mg disintegrating tablet  TAKE 1 TABLET BY MOUTH 4 TIMES DAILY AS NEEDED FOR UP TO 60 DOSES (CRAMPING, PAIN)     Loperamide HCl (IMODIUM A-D PO)      LORazepam (ATIVAN) 1 MG tablet Take 1 mg by mouth 3 (three) times daily.     promethazine (PHENERGAN) 25 MG tablet TAKE 1/2 TABLET (12.5 MG TOTAL) BY MOUTH EVERY 6 HOURS AS NEEDED FOR NAUSEA AND VOMITING 30 tablet 2   Trospium Chloride 60 MG CP24 Take 1 capsule by mouth every morning.      No current facility-administered medications for this visit.    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 Marlou Starks took out a lymph node in the right groin   01/01/2014 Imaging   Ct showed lymphadenopathy   01/26/2014 PET scan   Hypermetabolic retroperitoneal, periportal, and mesenteric lymph nodes is concerning for lymphoproliferative process.   03/06/2014 Pathology Results   Accession: [id number] right groin LN biopsy came back benign   06/02/2014 PET scan   Similar appearance of hypermetabolic retroperitoneal, periportal and mesenteric lymph nodes. Low grade lymphoproliferative disorder cannot be excluded.   12/01/2014 Imaging   Mildly enlarged upper abdominal/ retroperitoneal lymph nodes, measuring up to 13 mm, stable versus minimally increased.  No evidence of splenomegaly.   12/29/2015 Pathology Results   Accession: [id number] LN biopsy of periaortic region was positive for low grade folliculr lymphoma. Leftf kidney specimen showed renal cell carcinoma   12/29/2015 Surgery   She underwent robotic-assisted laparoscopic left radical nephrectomy. Left retroperitoneal lymph node dissection.   03/06/2016 PET scan   PET Ct showed stable lymphadenopathy  with no major change   01/29/2018 Procedure   Colonoscopy by Dr. Earlean Shawl revealed 1  polyp and internal hemorrhoids   03/07/2021 Cancer Staging   Staging form: Hodgkin and Non-Hodgkin Lymphoma, AJCC 7th Edition - Clinical: A - Asymptomatic - Signed by Heath Lark, MD on 03/07/2021 Stage prefix: Recurrence   03/07/2021 Cancer Staging   Staging form: Hodgkin and Non-Hodgkin Lymphoma, AJCC 7th Edition - Pathologic stage from 03/07/2021: Stage III - Signed by Heath Lark, MD on 03/07/2021   03/24/2021 PET scan   1. Mild disease progression within the chest. 2. Minimal decrease in nodal size and hypermetabolism within abdominal nodes. An isolated newly hypermetabolic ileocolic mesenteric node is seen. 3.  ([place]) 4 4. Coronary artery atherosclerosis. Aortic Atherosclerosis (ICD10-I70.0). 5. Left nephrectomy.     PHYSICAL EXAMINATION: ECOG PERFORMANCE STATUS: 1 - Symptomatic but completely ambulatory  Vitals:   03/30/22 0909  BP: 128/82  Pulse: (!) 103  Resp: 18  Temp: 100 F (37.8 C)  SpO2: 97%   Filed Weights   03/30/22 0909  Weight: 152 lb 12.8 oz (69.3 kg)    GENERAL:alert, no distress and comfortable SKIN: skin color, texture, turgor are normal, no rashes or significant lesions EYES: normal, Conjunctiva are pink and non-injected, sclera clear OROPHARYNX:no exudate, no erythema and lips, buccal mucosa, and tongue normal  NECK: supple, thyroid normal size, non-tender, without nodularity LYMPH:  no palpable lymphadenopathy in the cervical, axillary or inguinal LUNGS: clear to auscultation and percussion with normal breathing effort HEART: regular rate & rhythm and no murmurs and no lower extremity edema ABDOMEN:abdomen soft, non-tender and normal bowel sounds Musculoskeletal:no cyanosis of digits and no clubbing  NEURO: alert & oriented x 3 with fluent speech, no focal motor/sensory deficits  LABORATORY DATA:  I have reviewed the data as listed    Component Value Date/Time    NA 140 03/30/2022 0836   NA 143 08/11/2021 1600   NA 140 12/01/2014 1039   K 3.6 03/30/2022 0836   K 2.8 (LL) 12/01/2014 1039   CL 98 03/30/2022 0836   CO2 36 (H) 03/30/2022 0836   CO2 30 (H) 12/01/2014 1039   GLUCOSE 85 03/30/2022 0836   GLUCOSE 143 (H) 12/01/2014 1039   BUN 11 03/30/2022 0836   BUN 15 08/11/2021 1600   BUN 14.3 12/01/2014 1039   CREATININE 0.88 03/30/2022 0836   CREATININE 0.8 12/01/2014 1039   CALCIUM 10.0 03/30/2022 0836   CALCIUM 10.0 12/01/2014 1039   PROT 8.0 03/30/2022 0836   PROT 7.4 03/24/2020 1000   PROT 8.4 (H) 12/01/2014 1039   ALBUMIN 4.4 03/30/2022 0836   ALBUMIN 4.3 03/24/2020 1000   ALBUMIN 4.1 12/01/2014 1039   AST 28 03/30/2022 0836   AST 23 12/01/2014 1039   ALT 14 03/30/2022 0836   ALT 14 12/01/2014 1039   ALKPHOS 73 03/30/2022 0836   ALKPHOS 71 12/01/2014 1039   BILITOT 0.4 03/30/2022 0836   BILITOT 0.4 03/24/2020 1000   BILITOT 0.22 12/01/2014 1039   GFRNONAA >60 03/30/2022 0836   GFRAA 105 03/24/2020 1000    No results found for: "SPEP", "UPEP"  Lab Results  Component Value Date   WBC 5.4 03/30/2022   NEUTROABS 3.4 03/30/2022   HGB 12.2 03/30/2022   HCT 37.2 03/30/2022   MCV 95.6 03/30/2022   PLT 282 03/30/2022      Chemistry      Component Value Date/Time   NA 140 03/30/2022 0836   NA 143 08/11/2021 1600   NA 140 12/01/2014 1039   K 3.6 03/30/2022 0836   K 2.8 (LL) 12/01/2014 1039  CL 98 03/30/2022 0836   CO2 36 (H) 03/30/2022 0836   CO2 30 (H) 12/01/2014 1039   BUN 11 03/30/2022 0836   BUN 15 08/11/2021 1600   BUN 14.3 12/01/2014 1039   CREATININE 0.88 03/30/2022 0836   CREATININE 0.8 12/01/2014 1039   GLU 87 11/03/2016 0000   GLU 87 11/03/2016 0000      Component Value Date/Time   CALCIUM 10.0 03/30/2022 0836   CALCIUM 10.0 12/01/2014 1039   ALKPHOS 73 03/30/2022 0836   ALKPHOS 71 12/01/2014 1039   AST 28 03/30/2022 0836   AST 23 12/01/2014 1039   ALT 14 03/30/2022 0836   ALT 14 12/01/2014 1039    BILITOT 0.4 03/30/2022 0836   BILITOT 0.4 03/24/2020 1000   BILITOT 0.22 12/01/2014 1039

## 2022-03-30 NOTE — Telephone Encounter (Signed)
-----   Message from Heath Lark, MD sent at 03/30/2022  2:25 PM EDT ----- Pls let her know vitamin D level is good

## 2022-03-30 NOTE — Assessment & Plan Note (Signed)
She has chronic abdominal pain and was told she have diverticulosis I will defer to her gastroenterologist for management

## 2022-03-30 NOTE — Assessment & Plan Note (Signed)
There is nothing on the PET CT scan that would explain her chronic severe abdominal pain except for her hernia There is no need to consider any form of treatment for lymphoma Her examination is benign today I recommend surveillance in 1 year with history, physical examination and blood work only

## 2022-04-05 ENCOUNTER — Telehealth: Payer: Self-pay

## 2022-04-05 NOTE — Telephone Encounter (Signed)
Patient called stating that she is concerned about her creatine level on her ToxAssure results

## 2022-04-19 ENCOUNTER — Telehealth: Payer: Self-pay | Admitting: Nurse Practitioner

## 2022-04-19 NOTE — Telephone Encounter (Signed)
Patient left vm but would not give specifics on what she wanted to talk about and said it was urgent and complicated. Can you please call her at [phone]

## 2022-04-19 NOTE — Telephone Encounter (Signed)
Spoke to pt she wanted to informed you about her lab work and that she did not due her mammogram and also she is not taking her Vit D

## 2022-04-20 ENCOUNTER — Encounter: Payer: Medicare Other | Attending: Physical Medicine & Rehabilitation | Admitting: Registered Nurse

## 2022-04-20 ENCOUNTER — Encounter: Payer: Self-pay | Admitting: Registered Nurse

## 2022-04-20 VITALS — BP 153/84 | HR 105 | Ht 65.5 in | Wt 154.3 lb

## 2022-04-20 DIAGNOSIS — M546 Pain in thoracic spine: Secondary | ICD-10-CM | POA: Insufficient documentation

## 2022-04-20 DIAGNOSIS — G8929 Other chronic pain: Secondary | ICD-10-CM | POA: Insufficient documentation

## 2022-04-20 DIAGNOSIS — M5412 Radiculopathy, cervical region: Secondary | ICD-10-CM | POA: Insufficient documentation

## 2022-04-20 DIAGNOSIS — Z79891 Long term (current) use of opiate analgesic: Secondary | ICD-10-CM | POA: Insufficient documentation

## 2022-04-20 DIAGNOSIS — M542 Cervicalgia: Secondary | ICD-10-CM | POA: Insufficient documentation

## 2022-04-20 DIAGNOSIS — Z5181 Encounter for therapeutic drug level monitoring: Secondary | ICD-10-CM | POA: Insufficient documentation

## 2022-04-20 DIAGNOSIS — G894 Chronic pain syndrome: Secondary | ICD-10-CM | POA: Insufficient documentation

## 2022-04-20 DIAGNOSIS — M7061 Trochanteric bursitis, right hip: Secondary | ICD-10-CM | POA: Insufficient documentation

## 2022-04-20 DIAGNOSIS — M797 Fibromyalgia: Secondary | ICD-10-CM | POA: Insufficient documentation

## 2022-04-20 DIAGNOSIS — M47812 Spondylosis without myelopathy or radiculopathy, cervical region: Secondary | ICD-10-CM | POA: Insufficient documentation

## 2022-04-20 DIAGNOSIS — M5416 Radiculopathy, lumbar region: Secondary | ICD-10-CM | POA: Insufficient documentation

## 2022-04-20 DIAGNOSIS — M7918 Myalgia, other site: Secondary | ICD-10-CM | POA: Insufficient documentation

## 2022-04-20 DIAGNOSIS — M7062 Trochanteric bursitis, left hip: Secondary | ICD-10-CM | POA: Insufficient documentation

## 2022-04-20 DIAGNOSIS — I25750 Atherosclerosis of native coronary artery of transplanted heart with unstable angina: Secondary | ICD-10-CM

## 2022-04-20 MED ORDER — CYCLOBENZAPRINE HCL 10 MG PO TABS: 10.0000 mg | ORAL_TABLET | Freq: Three times a day (TID) | ORAL | 4 refills | Status: DC

## 2022-04-20 MED ORDER — HYDROCODONE-ACETAMINOPHEN 5-325 MG PO TABS: 1.0000 | ORAL_TABLET | Freq: Three times a day (TID) | ORAL | 0 refills | Status: DC | PRN

## 2022-04-20 NOTE — Progress Notes (Signed)
Subjective:    Patient ID: Patty Salas, female    DOB: 1955-11-23, 66 y.o.   MRN: [id number]  HPI: Patty Salas is a 66 y.o. female whose appointment was changed to a My-Chart Visit, she stated she is caring for her husband who was recently discharged from hospital after having a stoke. Patty Salas and I : have  discussed the limitations of evaluation and management by telemedicine and the availability of in person appointments. The patient expressed understanding and agreed to proceed.  She. states her pain is located in her neck radiating into bilateral shoulders, mid- lower back pain radiating into her bilateral hips and bilateral lower extremities. She. rates her pain 10. Her current exercise regime is walking and performing stretching exercises.  Patty. Salas Morphine equivalent is 10.00 MME. She  is also prescribed Lorazepam  by Dr. Reece Levy .We have discussed the black box warning of using opioids and benzodiazepines. I highlighted the dangers of using these drugs together and discussed the adverse events including respiratory suppression, overdose, cognitive impairment and importance of compliance with current regimen. We will continue to monitor and adjust as indicated.  she is being closely monitored and under the care of her psychiatrist.  Last UDS was Performed on 03/23/2022, it was consistent.       Pain Inventory Average Pain 9 Pain Right Now 10 My pain is constant, sharp, burning, dull, stabbing, tingling, and aching  In the last 24 hours, has pain interfered with the following? General activity 9 Relation with others 3 Enjoyment of life 0 What TIME of day is your pain at its worst? morning , daytime, evening, and night Sleep (in general) Fair  Pain is worse with: sitting, inactivity, and some activites Pain improves with: rest, heat/ice, medication, and injections Relief from Meds: 6  Family History  Problem Relation Age of Onset   Cancer Mother        stomach    Migraines Mother    Arthritis Mother    Emphysema Mother    Heart disease Father    Hypertension Father    Cancer Father        colon   Dementia Father    Hypertension Brother    Migraines Brother    Hypertension Brother    Migraines Brother    Alcohol abuse Brother    Bipolar disorder Brother    Migraines Daughter    Arthritis Maternal Grandmother    Cancer Maternal Grandmother        stomach   Diabetes Maternal Grandmother    Stroke Maternal Grandmother    Cancer Maternal Aunt        lung   Emphysema Maternal Aunt    Cancer Paternal Uncle        colon   Hypertension Maternal Aunt    Heart disease Maternal Aunt    Cancer Paternal Uncle        lung   Social History   Socioeconomic History   Marital status: Married    Spouse name: Not on file   Number of children: 1   Years of education: COLLEGE1   Highest education level: Not on file  Occupational History   Occupation: HOUSEWIFE    Employer: UNEMPLOYED  Tobacco Use   Smoking status: Never   Smokeless tobacco: Never  Vaping Use   Vaping Use: Never used  Substance and Sexual Activity   Alcohol use: No   Drug use: No   Sexual activity: Not on file  Other Topics Concern   Not on file  Social History Narrative   Patient is right handed.   Patient drinks caffeine occasionally.   Social Determinants of Health   Financial Resource Strain: Not on file  Food Insecurity: Not on file  Transportation Needs: Not on file  Physical Activity: Not on file  Stress: Not on file  Social Connections: Not on file   Past Surgical History:  Procedure Laterality Date    kidney stones     ABDOMINAL ADHESION SURGERY     ABDOMINAL HYSTERECTOMY  1995   partial- hemmoraged after surgery-stitch came loose   APPENDECTOMY  1993   hemmoraged after surgery- stitch came loose   CERVICAL DISC SURGERY  06/04/2001   with fusion   [place]   colonoscopy     COLONOSCOPY     CYSTOSCOPY     FOOT SURGERY     lt    INCISIONAL HERNIA REPAIR N/A 04/22/2021   Procedure: LAPAROSCOPIC INCISIONAL HERNIA REPAIR WITH MESH;  Surgeon: Clovis Riley, MD;  Location: [facility];  Service: General;  Laterality: N/A;   jj stent  07/2002   with ureteroscopy, cystoscopy and then removal of stent in office   LAPAROSCOPIC LYSIS OF ADHESIONS N/A 04/22/2021   Procedure: LYSIS OF ADHESIONS;  Surgeon: Clovis Riley, MD;  Location: [facility];  Service: General;  Laterality: N/A;   LITHOTRIPSY     LYMPH NODE BIOPSY Right 03/06/2014   Procedure: right groin LYMPH NODE BIOPSY;  Surgeon: Merrie Roof, MD;  Location: [facility];  Service: General;  Laterality: Right;   LYMPHADENECTOMY N/A 12/29/2015   Procedure: RETROPERITONEAL LYMPHADENECTOMY;  Surgeon: Alexis Frock, MD;  Location: [facility];  Service: Urology;  Laterality: N/A;   NECK SURGERY  2002   ROBOT ASSISTED LAPAROSCOPIC NEPHRECTOMY Left 12/29/2015   Procedure: XI ROBOTIC ASSISTED LEFT LAPAROSCOPIC NEPHRECTOMY;  Surgeon: Alexis Frock, MD;  Location: [facility];  Service: Urology;  Laterality: Left;   TONSILLECTOMY  1976   Past Surgical History:  Procedure Laterality Date    kidney stones     ABDOMINAL ADHESION SURGERY     ABDOMINAL HYSTERECTOMY  1995   partial- hemmoraged after surgery-stitch came loose   APPENDECTOMY  1993   hemmoraged after surgery- stitch came loose   CERVICAL DISC SURGERY  06/04/2001   with fusion   [place]   colonoscopy     COLONOSCOPY     CYSTOSCOPY     FOOT SURGERY     lt   INCISIONAL HERNIA REPAIR N/A 04/22/2021   Procedure: LAPAROSCOPIC INCISIONAL HERNIA REPAIR WITH MESH;  Surgeon: Clovis Riley, MD;  Location: [facility];  Service: General;  Laterality: N/A;   jj stent  07/2002   with ureteroscopy, cystoscopy and then removal of stent in office   LAPAROSCOPIC LYSIS OF ADHESIONS N/A 04/22/2021   Procedure: LYSIS OF ADHESIONS;  Surgeon: Clovis Riley, MD;  Location: [facility];  Service: General;  Laterality: N/A;    LITHOTRIPSY     LYMPH NODE BIOPSY Right 03/06/2014   Procedure: right groin LYMPH NODE BIOPSY;  Surgeon: Merrie Roof, MD;  Location: [facility];  Service: General;  Laterality: Right;   LYMPHADENECTOMY N/A 12/29/2015   Procedure: RETROPERITONEAL LYMPHADENECTOMY;  Surgeon: Alexis Frock, MD;  Location: [facility];  Service: Urology;  Laterality: N/A;   NECK SURGERY  2002   ROBOT ASSISTED LAPAROSCOPIC NEPHRECTOMY Left 12/29/2015   Procedure: XI ROBOTIC ASSISTED  LEFT LAPAROSCOPIC NEPHRECTOMY;  Surgeon: Alexis Frock, MD;  Location: [facility];  Service: Urology;  Laterality: Left;   TONSILLECTOMY  1976   Past Medical History:  Diagnosis Date   Anxiety    Arthritis    Asthma    Bipolar 2 disorder ([place])    pt stated, "I have Bipolar 2 and it is remission"   Bipolar affective ([place])    CAD (coronary artery disease), native artery transplanted heart    Nonobstructive by coronary CTA 08/2021 with less than 25% stenosis in the LAD, RCA and left circumflex.   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   Full dentures    Gout    Headache(784.0)    migraines   Herpesviral infection    Hypertension    IBS (irritable bowel syndrome)    with diarrhea   Lymphadenopathy    Myalgia and myositis, unspecified    PAT (paroxysmal atrial tachycardia) ([facility])    asymptomatic 10 beat run on event monitor 09/2020   PFO (patent foramen ovale)    Small PFO noted on coronary CTA   Pneumonia    PONV (postoperative nausea and vomiting)    PTSD (post-traumatic stress disorder)    Renal calculi    Restless legs syndrome (RLS)    Thoracic radiculopathy    Vitamin D deficiency    Wears glasses    BP (!) 153/84 Comment: Video visit today: Per patient  Pulse (!)  105   Ht 5' 5.5" (1.664 m)   Wt 154 lb 4.8 oz (70 kg)   BMI 25.29 kg/m   Opioid Risk Score:   Fall Risk Score:  `1  Depression screen [facility] 2/9     04/20/2022   11:36 AM 03/23/2022    2:16 PM 01/04/2022    9:20 AM 11/23/2021    1:05 PM 10/19/2021    1:29 PM 10/05/2021    9:16 AM 09/06/2021   11:11 AM  Depression screen PHQ 2/9  Decreased Interest 1 1 0 1 0 1 0  Down, Depressed, Hopeless 1 1 0 1 0 0 0  PHQ - 2 Score 2 2 0 2 0 1 0  Altered sleeping     0 0 0  Tired, decreased energy     '1 1 1  '$ Change in appetite     0 1 0  Feeling bad or failure about yourself      0 0 0  Trouble concentrating     1 1 0  Moving slowly or fidgety/restless     0 0 1  Suicidal thoughts     0 0 0  PHQ-9 Score     '2 4 2    '$ Review of Systems  Musculoskeletal:  Positive for arthralgias, back pain, gait problem and neck pain.  All other systems reviewed and are negative.      Objective:   Physical Exam Vitals and nursing note reviewed.  Musculoskeletal:     Comments: No Physical Exam Performed: My Chart Visit         Assessment & Plan:  1 History of fibromyalgia with myofascial pain and multiple trigger points.Continue with Heat and exercise Regime. Continue with current medication regimen with  gabapentin. 04/20/2022 2 Chronic migraine headaches.Continue to Monitor. S/P Botox.on 05/30/2017. 04/20/2022 3. Midline Low Back Pain/ Lumbar Spondylosis/Lumbar degenerative disk disease, L4-5/ Lumbar Radiculitis: Continue with HEP and current medication regimen. Continue Hydrocodone 5/325 mg one tablet every 8 hours as needed #80. Marland KitchenContinue with slow weaning of Hydrocodone. Second script sent for the following month to accommodate scheduled appointment . 04/20/2022 4. 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 Urology Following. 04/20/2022. 5. 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 04/20/2022 6. Cervicalgia/ Cervical RadiculitisCervical Spondylosis:  Continue current  medication regiment with  Gabapentin. Continue  with HEP and Continue to monitor. 04/20/2022 7. Muscle Spasm: Myofascial Pain: She is svcheduled for Trigger Point Injection with Dr Naaman Plummer : Continue current medication regimen with Flexeril as needed. 04/20/2022 8. Bilateral Greater Trochanter Bursitis: Continue to alternate with ice and heat therapy. Continue HEP as Tolerated. Continue to monitor. 04/20/2022 9. Bilateral Knee Pain: S/P Knee Injection by Dr Sharol Given on 02/23/2020. Orthopedics Following. Continue to Monitor. 04/20/2022  10. Left Groin Pain: Patty. Cusic reports Oncology Following. Continue to Monitor. 04/20/2022.    F/U in 1 month My Chart Video Visit No Video Connection 100 % Audio Connection Location Of Patient in her Home Location of Provider In the Office Total Time 10 Minutes

## 2022-05-03 NOTE — Telephone Encounter (Signed)
Reviewed labs and all look good.

## 2022-05-08 ENCOUNTER — Other Ambulatory Visit: Payer: Self-pay | Admitting: Nurse Practitioner

## 2022-05-08 ENCOUNTER — Telehealth: Payer: Self-pay | Admitting: Nurse Practitioner

## 2022-05-08 ENCOUNTER — Telehealth: Payer: Self-pay | Admitting: Neurology

## 2022-05-08 DIAGNOSIS — J0140 Acute pansinusitis, unspecified: Secondary | ICD-10-CM

## 2022-05-08 MED ORDER — SULFAMETHOXAZOLE-TRIMETHOPRIM 800-160 MG PO TABS: 1.0000 | ORAL_TABLET | Freq: Two times a day (BID) | ORAL | 0 refills | Status: DC

## 2022-05-08 NOTE — Telephone Encounter (Signed)
Noted  

## 2022-05-08 NOTE — Telephone Encounter (Signed)
FYI: Pt would like Sarah, NP to know she cancelled appt on 05/09/22 due to being sick, chills, 101.3 fever,nausea, headache. Was not a schedule conflict.

## 2022-05-08 NOTE — Telephone Encounter (Signed)
Please let her know I sent prescription for bactrim DS which is a sulfa antibiotic. I did not see this listed on her allergy list, however, I do see allergy to seroquesl. The common ingredient is corn startch, maybe. Anyway, she takes this twice daily for 7 days. She should take over the counter medications to treat acute symptoms. Rest and increase fluids. Thanks so much.   -HB

## 2022-05-08 NOTE — Telephone Encounter (Signed)
Patient called stating she has another sinus infection and is asking if you can send anything over to the pharmacy? Please advise.

## 2022-05-09 ENCOUNTER — Ambulatory Visit: Payer: Medicare Other | Admitting: Neurology

## 2022-05-09 NOTE — Telephone Encounter (Signed)
Patient aware.

## 2022-05-18 ENCOUNTER — Telehealth: Payer: Self-pay | Admitting: Registered Nurse

## 2022-05-18 DIAGNOSIS — M797 Fibromyalgia: Secondary | ICD-10-CM

## 2022-05-18 DIAGNOSIS — G894 Chronic pain syndrome: Secondary | ICD-10-CM

## 2022-05-18 MED ORDER — HYDROCODONE-ACETAMINOPHEN 5-325 MG PO TABS: 1.0000 | ORAL_TABLET | Freq: Three times a day (TID) | ORAL | 0 refills | Status: DC | PRN

## 2022-05-18 NOTE — Telephone Encounter (Signed)
Patient Patty Salas due to her having a fever. She needs her Norco refilled.

## 2022-05-18 NOTE — Telephone Encounter (Signed)
PMP was Reviewed.  Patty Salas called office reporting she was ill, her scheduled appointment was re-scheduled.  Hydrocodone e-scribed today, Patty Salas is aware of the above.

## 2022-05-19 ENCOUNTER — Encounter: Payer: Medicare Other | Admitting: Registered Nurse

## 2022-05-30 ENCOUNTER — Ambulatory Visit: Payer: Medicare Other | Admitting: Neurology

## 2022-06-02 ENCOUNTER — Encounter: Payer: Medicare Other | Admitting: Registered Nurse

## 2022-06-07 ENCOUNTER — Telehealth: Payer: Self-pay | Admitting: Neurology

## 2022-06-07 NOTE — Telephone Encounter (Signed)
The patient has cancelled appointments on the dates of 11/16/2021, 12/08/2021, 01/10/2022, 02/02/2022, 05/09/2022, 05/30/2022, and 06/13/2022 since her last video visit on 11/10/2020. Will forward to Butler Denmark, NP.

## 2022-06-07 NOTE — Telephone Encounter (Addendum)
Pt request refill for promethazine (PHENERGAN) 25 MG tablet and almotriptan (AXERT) 12.5 MG tablet at [id number] Scheduled appt 07/12/22 at 1:15 pm

## 2022-06-08 ENCOUNTER — Other Ambulatory Visit: Payer: Self-pay | Admitting: Neurology

## 2022-06-08 MED ORDER — PROMETHAZINE HCL 25 MG PO TABS: ORAL_TABLET | ORAL | 0 refills | Status: DC

## 2022-06-08 NOTE — Telephone Encounter (Signed)
Pt called stating that she made a mistake and she would like to clear up that she does not need a refill on her almotriptan (AXERT) 12.5 MG tablet she realized she has 8 pills left so she is good on that medication.

## 2022-06-08 NOTE — Telephone Encounter (Signed)
I spoke verbally with Patty Salas on this and she agreed to refill the Promethazine 25 mg for a 30 day supply.   Rx has been sent.

## 2022-06-08 NOTE — Telephone Encounter (Signed)
Pt would like a call from the nurse to discuss refills. Only have one tablet left of promethazine (PHENERGAN) 25 MG tablet

## 2022-06-09 ENCOUNTER — Encounter: Payer: Medicare Other | Admitting: Registered Nurse

## 2022-06-12 ENCOUNTER — Encounter: Payer: Medicare Other | Attending: Physical Medicine & Rehabilitation | Admitting: Registered Nurse

## 2022-06-12 ENCOUNTER — Encounter: Payer: Self-pay | Admitting: Registered Nurse

## 2022-06-12 VITALS — BP 149/86 | HR 100 | Ht 65.5 in | Wt 153.2 lb

## 2022-06-12 DIAGNOSIS — Z79891 Long term (current) use of opiate analgesic: Secondary | ICD-10-CM | POA: Diagnosis present

## 2022-06-12 DIAGNOSIS — M47812 Spondylosis without myelopathy or radiculopathy, cervical region: Secondary | ICD-10-CM | POA: Insufficient documentation

## 2022-06-12 DIAGNOSIS — G894 Chronic pain syndrome: Secondary | ICD-10-CM | POA: Insufficient documentation

## 2022-06-12 DIAGNOSIS — M7918 Myalgia, other site: Secondary | ICD-10-CM | POA: Insufficient documentation

## 2022-06-12 DIAGNOSIS — M546 Pain in thoracic spine: Secondary | ICD-10-CM | POA: Insufficient documentation

## 2022-06-12 DIAGNOSIS — Z5181 Encounter for therapeutic drug level monitoring: Secondary | ICD-10-CM | POA: Insufficient documentation

## 2022-06-12 DIAGNOSIS — M7062 Trochanteric bursitis, left hip: Secondary | ICD-10-CM | POA: Insufficient documentation

## 2022-06-12 DIAGNOSIS — M654 Radial styloid tenosynovitis [de Quervain]: Secondary | ICD-10-CM | POA: Insufficient documentation

## 2022-06-12 DIAGNOSIS — M5412 Radiculopathy, cervical region: Secondary | ICD-10-CM | POA: Diagnosis not present

## 2022-06-12 DIAGNOSIS — M797 Fibromyalgia: Secondary | ICD-10-CM | POA: Insufficient documentation

## 2022-06-12 DIAGNOSIS — M5416 Radiculopathy, lumbar region: Secondary | ICD-10-CM | POA: Diagnosis not present

## 2022-06-12 DIAGNOSIS — G8929 Other chronic pain: Secondary | ICD-10-CM | POA: Insufficient documentation

## 2022-06-12 DIAGNOSIS — M542 Cervicalgia: Secondary | ICD-10-CM | POA: Diagnosis not present

## 2022-06-12 DIAGNOSIS — M7061 Trochanteric bursitis, right hip: Secondary | ICD-10-CM | POA: Insufficient documentation

## 2022-06-12 DIAGNOSIS — I25750 Atherosclerosis of native coronary artery of transplanted heart with unstable angina: Secondary | ICD-10-CM

## 2022-06-12 MED ORDER — HYDROCODONE-ACETAMINOPHEN 5-325 MG PO TABS: 1.0000 | ORAL_TABLET | Freq: Three times a day (TID) | ORAL | 0 refills | Status: DC | PRN

## 2022-06-12 MED ORDER — METHYLPREDNISOLONE 4 MG PO TBPK: ORAL_TABLET | ORAL | 0 refills | Status: DC

## 2022-06-12 NOTE — Progress Notes (Signed)
Subjective:    Patient ID: Patty Salas, female    DOB: [birth year], 66 y.o.   MRN: [id number]  HPI: Patty Salas is a 66 y.o. female who returns for follow up appointment for chronic pain and medication refill. She states her pain is located in her neck radiating into her bilateral shoulders, mid- lower back pain radiating into her bilateral hips and bilateral lower extremities. She also reports right wrist pain.She rates her pain 9. Her current exercise regime is walking with walker and performing stretching exercises.  Patty Salas reports on 06/02/2022 she was walking in her home and lost her balance and fell landing on her left side. She was able to pick herself up. Educated on falls prevention she verbalizes understanding.   Patty Salas Morphine equivalent is 13.33 MME. She is also prescribed Lorazepam  by Dr. Reece Levy .We have discussed the black box warning of using opioids and benzodiazepines. I highlighted the dangers of using these drugs together and discussed the adverse events including respiratory suppression, overdose, cognitive impairment and importance of compliance with current regimen. We will continue to monitor and adjust as indicated.  she is being closely monitored and under the care of her psychiatrist.   Last UDS was Performed on 03/23/2022, it was consistent.       Pain Inventory Average Pain 9 Pain Right Now 9 My pain is constant, sharp, burning, stabbing, tingling, and aching  In the last 24 hours, has pain interfered with the following? General activity 9 Relation with others 10 Enjoyment of life 10 What TIME of day is your pain at its worst? morning , daytime, evening, and night Sleep (in general) Fair  Pain is worse with: walking, bending, sitting, inactivity, and standing Pain improves with: rest, heat/ice, therapy/exercise, medication, and injections Relief from Meds: 4  Family History  Problem Relation Age of Onset   Cancer Mother        stomach    Migraines Mother    Arthritis Mother    Emphysema Mother    Heart disease Father    Hypertension Father    Cancer Father        colon   Dementia Father    Hypertension Brother    Migraines Brother    Hypertension Brother    Migraines Brother    Alcohol abuse Brother    Bipolar disorder Brother    Migraines Daughter    Arthritis Maternal Grandmother    Cancer Maternal Grandmother        stomach   Diabetes Maternal Grandmother    Stroke Maternal Grandmother    Cancer Maternal Aunt        lung   Emphysema Maternal Aunt    Cancer Paternal Uncle        colon   Hypertension Maternal Aunt    Heart disease Maternal Aunt    Cancer Paternal Uncle        lung   Social History   Socioeconomic History   Marital status: Married    Spouse name: Not on file   Number of children: 1   Years of education: COLLEGE1   Highest education level: Not on file  Occupational History   Occupation: HOUSEWIFE    Employer: UNEMPLOYED  Tobacco Use   Smoking status: Never   Smokeless tobacco: Never  Vaping Use   Vaping Use: Never used  Substance and Sexual Activity   Alcohol use: No   Drug use: No   Sexual activity: Not on file  Other Topics Concern   Not on file  Social History Narrative   Patient is right handed.   Patient drinks caffeine occasionally.   Social Determinants of Health   Financial Resource Strain: Not on file  Food Insecurity: Not on file  Transportation Needs: Not on file  Physical Activity: Not on file  Stress: Not on file  Social Connections: Not on file   Past Surgical History:  Procedure Laterality Date    kidney stones     ABDOMINAL ADHESION SURGERY     ABDOMINAL HYSTERECTOMY  1995   partial- hemmoraged after surgery-stitch came loose   APPENDECTOMY  1993   hemmoraged after surgery- stitch came loose   CERVICAL DISC SURGERY  06/04/2001   with fusion   [place]   colonoscopy     COLONOSCOPY     CYSTOSCOPY     FOOT SURGERY     lt    INCISIONAL HERNIA REPAIR N/A 04/22/2021   Procedure: LAPAROSCOPIC INCISIONAL HERNIA REPAIR WITH MESH;  Surgeon: Clovis Riley, MD;  Location: [facility];  Service: General;  Laterality: N/A;   jj stent  07/2002   with ureteroscopy, cystoscopy and then removal of stent in office   LAPAROSCOPIC LYSIS OF ADHESIONS N/A 04/22/2021   Procedure: LYSIS OF ADHESIONS;  Surgeon: Clovis Riley, MD;  Location: [facility];  Service: General;  Laterality: N/A;   LITHOTRIPSY     LYMPH NODE BIOPSY Right 03/06/2014   Procedure: right groin LYMPH NODE BIOPSY;  Surgeon: Merrie Roof, MD;  Location: [facility];  Service: General;  Laterality: Right;   LYMPHADENECTOMY N/A 12/29/2015   Procedure: RETROPERITONEAL LYMPHADENECTOMY;  Surgeon: Alexis Frock, MD;  Location: [facility];  Service: Urology;  Laterality: N/A;   NECK SURGERY  2002   ROBOT ASSISTED LAPAROSCOPIC NEPHRECTOMY Left 12/29/2015   Procedure: XI ROBOTIC ASSISTED LEFT LAPAROSCOPIC NEPHRECTOMY;  Surgeon: Alexis Frock, MD;  Location: [facility];  Service: Urology;  Laterality: Left;   TONSILLECTOMY  1976   Past Surgical History:  Procedure Laterality Date    kidney stones     ABDOMINAL ADHESION SURGERY     ABDOMINAL HYSTERECTOMY  1995   partial- hemmoraged after surgery-stitch came loose   APPENDECTOMY  1993   hemmoraged after surgery- stitch came loose   CERVICAL DISC SURGERY  06/04/2001   with fusion   [place]   colonoscopy     COLONOSCOPY     CYSTOSCOPY     FOOT SURGERY     lt   INCISIONAL HERNIA REPAIR N/A 04/22/2021   Procedure: LAPAROSCOPIC INCISIONAL HERNIA REPAIR WITH MESH;  Surgeon: Clovis Riley, MD;  Location: [facility];  Service: General;  Laterality: N/A;   jj stent  07/2002   with ureteroscopy, cystoscopy and then removal of stent in office   LAPAROSCOPIC LYSIS OF ADHESIONS N/A 04/22/2021   Procedure: LYSIS OF ADHESIONS;  Surgeon: Clovis Riley, MD;  Location: [facility];  Service: General;  Laterality: N/A;    LITHOTRIPSY     LYMPH NODE BIOPSY Right 03/06/2014   Procedure: right groin LYMPH NODE BIOPSY;  Surgeon: Merrie Roof, MD;  Location: [facility];  Service: General;  Laterality: Right;   LYMPHADENECTOMY N/A 12/29/2015   Procedure: RETROPERITONEAL LYMPHADENECTOMY;  Surgeon: Alexis Frock, MD;  Location: [facility];  Service: Urology;  Laterality: N/A;   NECK SURGERY  2002   ROBOT ASSISTED LAPAROSCOPIC NEPHRECTOMY Left 12/29/2015   Procedure: XI ROBOTIC ASSISTED  LEFT LAPAROSCOPIC NEPHRECTOMY;  Surgeon: Alexis Frock, MD;  Location: [facility];  Service: Urology;  Laterality: Left;   TONSILLECTOMY  1976   Past Medical History:  Diagnosis Date   Anxiety    Arthritis    Asthma    Bipolar 2 disorder ([place])    pt stated, "I have Bipolar 2 and it is remission"   Bipolar affective ([place])    CAD (coronary artery disease), native artery transplanted heart    Nonobstructive by coronary CTA 08/2021 with less than 25% stenosis in the LAD, RCA and left circumflex.   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   Full dentures    Gout    Headache(784.0)    migraines   Herpesviral infection    Hypertension    IBS (irritable bowel syndrome)    with diarrhea   Lymphadenopathy    Myalgia and myositis, unspecified    PAT (paroxysmal atrial tachycardia) (HCC)    asymptomatic 10 beat run on event monitor 09/2020   PFO (patent foramen ovale)    Small PFO noted on coronary CTA   Pneumonia    PONV (postoperative nausea and vomiting)    PTSD (post-traumatic stress disorder)    Renal calculi    Restless legs syndrome (RLS)    Thoracic radiculopathy    Vitamin D deficiency    Wears glasses    BP (!) 165/78   Pulse (!) 110   Ht 5' 5.5" (1.664 m)   Wt 153 lb  3.2 oz (69.5 kg)   SpO2 94%   BMI 25.11 kg/m   Opioid Risk Score:   Fall Risk Score:  `1  Depression screen [facility] 2/9     04/20/2022   11:36 AM 03/23/2022    2:16 PM 01/04/2022    9:20 AM 11/23/2021    1:05 PM 10/19/2021    1:29 PM 10/05/2021    9:16 AM 09/06/2021   11:11 AM  Depression screen PHQ 2/9  Decreased Interest 1 1 0 1 0 1 0  Down, Depressed, Hopeless 1 1 0 1 0 0 0  PHQ - 2 Score 2 2 0 2 0 1 0  Altered sleeping     0 0 0  Tired, decreased energy     '1 1 1  '$ Change in appetite     0 1 0  Feeling bad or failure about yourself      0 0 0  Trouble concentrating     1 1 0  Moving slowly or fidgety/restless     0 0 1  Suicidal thoughts     0 0 0  PHQ-9 Score     '2 4 2     '$ Review of Systems  Musculoskeletal:  Positive for myalgias.  All other systems reviewed and are negative.     Objective:   Physical Exam Vitals and nursing note reviewed.  Constitutional:      Appearance: Normal appearance.  Neck:     Comments: Cervical Paraspinal Tenderness: C-5-C-6  Cardiovascular:     Rate and Rhythm: Normal rate and regular rhythm.     Pulses: Normal pulses.     Heart sounds: Normal heart sounds.  Pulmonary:  Effort: Pulmonary effort is normal.     Breath sounds: Normal breath sounds.  Musculoskeletal:     Cervical back: Normal range of motion and neck supple.     Comments: Normal Muscle Bulk and Muscle Testing Reveals:  Upper Extremities: Full ROM and Muscle Strength 5/5 Bilateral AC Joint Tenderness  Thoracic Paraspinal Tenderness: T-7-T-9  Lumbar Paraspinal Tenderness: L-3-L-5 Lower Extremities: Full ROM and Muscle Strength 5/5 Arises from Table slowly using walker for support Narrow Based  Gait     Skin:    General: Skin is warm and dry.  Neurological:     Mental Status: She is alert and oriented to person, place, and time.  Psychiatric:        Mood and Affect: Mood normal.        Behavior: Behavior normal.         Assessment & Plan:  1 History of  fibromyalgia with myofascial pain and multiple trigger points.Continue with Heat and exercise Regime. Continue with current medication regimen with  gabapentin. 06/12/2022 2 Chronic migraine headaches.Continue to Monitor. S/P Botox.on 05/30/2017. 06/12/2022 3. Midline Low Back Pain/ Lumbar Spondylosis/Lumbar degenerative disk disease, L4-5/ Lumbar Radiculitis: Continue with HEP and current medication regimen. Continue Hydrocodone 5/325 mg one tablet every 8 hours as needed #80. Marland KitchenContinue with slow weaning of Hydrocodone. Second script sent for the following month to accommodate scheduled appointment . 06/12/2022 4. 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 Urology Following. 06/12/2022. 5. 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 06/12/2022 6. Cervicalgia/ Cervical RadiculitisCervical Spondylosis:  Continue current medication regiment with  Gabapentin. Continue  with HEP and Continue to monitor. 06/12/2022 7. Muscle Spasm: Myofascial Pain: She is svcheduled for Trigger Point Injection with Dr Naaman Plummer : Continue current medication regimen with Flexeril as needed. 06/12/2022 8. Bilateral Greater Trochanter Bursitis: Continue to alternate with ice and heat therapy. Continue HEP as Tolerated. Continue to monitor. 06/12/2022 9. Bilateral Knee Pain: S/P Knee Injection by Dr Sharol Given on 02/23/2020. Orthopedics Following. Continue to Monitor. 06/12/2022  10. Left Groin Pain: No complaints today.Ms. Scholler reports Oncology Following. Continue to Monitor. 04/20/2022.  58. Fall at Home: Educated on falls prevention, she verbalizes understanding. Continue to Monitor.   12. Alfonse Ras Tenosynovitis:  RX: Medrol Dose Pak. Continue to Monitor.   F/U in 1 month

## 2022-06-13 ENCOUNTER — Ambulatory Visit: Payer: Medicare Other | Admitting: Neurology

## 2022-06-28 ENCOUNTER — Encounter: Payer: Medicare Other | Admitting: Physical Medicine & Rehabilitation

## 2022-07-11 ENCOUNTER — Telehealth: Payer: Self-pay | Admitting: Neurology

## 2022-07-11 NOTE — Telephone Encounter (Signed)
..   Pt understands that although there may be some limitations with this type of visit, we will take all precautions to reduce any security or privacy concerns.  Pt understands that this will be treated like an in office visit and we will file with pt's insurance, and there may be a patient responsible charge related to this service. ? ?

## 2022-07-11 NOTE — Progress Notes (Unsigned)
   Virtual Visit via Video Note  I connected with JAYMES REVELS on 07/12/22 at  1:15 PM EDT by a video enabled telemedicine application and verified that I am speaking with the correct person using two identifiers.  Location: Patient: at her home Provider: in the office    I discussed the limitations of evaluation and management by telemedicine and the availability of in person appointments. The patient expressed understanding and agreed to proceed.  History of Present Illness: Today July 12, 2022 SS: Shantana is here today for follow up for virtual visit. She wanted to be seen in office but had flu like symptoms. Yesterday BP was 160/104, HR 108. Is off fentanyl, is planning to wean off Norco, Lorazepam. Going to pain clinic. Has not tolerated Botox in the past, made her head swell up. Remains under a lot of stress. Gets trigger point injections basically all over with Dr. Naaman Plummer. Takes Axert for migraine headaches, works great for her, pays cash. Takes all her monthly supply.She cannot afford CGRP injection. Has 10.5 tablets of phenergan left since last refill. Claims trying to be on less medications.  11/10/2020 SS: Ms. Ballman is a 66 year old female with history of intractable migraine headache.  She has previously not tolerated Botox.  Has not been able to afford CGRP, any of them. She is allergic to 58 medications. 2-3 migraines a week, takes Axert, generic, no more than twice a week. Usually starts with phenergan 25 mg 1/2 tablet for migraine, if not helpful will take Axert.  Is really careful not to get rebound headache. Insurance won't cover, uses good rx coupon gets 24 tablets for 3 month period. Needs refill by 3/20. Sees pain management is on Fentanyl, Norco, Flexeril, Ativan. Is hopeful to one day just be on Flexeril. Often for headaches hot/cold mask works well. 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 Here today for follow-up via telephone we couldn't connect on my my chart. A lot of stress about her husband's  kidney issues.    Observations/Objective: Via virtual visit is alert and oriented, speech is clear and concise, facial symmetry noted, moves about freely  Assessment and Plan: 1.  Chronic migraine headache -Continues with frequent headache, for migraines takes all 8 tablets monthly -Continue Axert as needed for acute headache -She uses Phenergan for nausea associated with significant headache, I have asked her to reduce this, we will cut back to 20 tablets monthly -She has 80 medication allergies/contraindications -Has not tolerated Botox, has not been able to afford CGRP, we may could try Nurtec, Qulipta, Vyepti-pulse may be an issue even with insurance -I will see her back in 6 months in office -She follows with pain management  Follow Up Instructions: 6 months   I discussed the assessment and treatment plan with the patient. The patient was provided an opportunity to ask questions and all were answered. The patient agreed with the plan and demonstrated an understanding of the instructions.   The patient was advised to call back or seek an in-person evaluation if the symptoms worsen or if the condition fails to improve as anticipated.  Evangeline Dakin, DNP  [facility] [address] [place], [place] [address] [phone]

## 2022-07-11 NOTE — Telephone Encounter (Signed)
Pt is calling. Stated she want me to let Judson Roch know that she knows about the black box. Stated she know not to take all this medication together.

## 2022-07-12 ENCOUNTER — Telehealth (INDEPENDENT_AMBULATORY_CARE_PROVIDER_SITE_OTHER): Payer: Medicare Other | Admitting: Neurology

## 2022-07-12 ENCOUNTER — Encounter: Payer: Self-pay | Admitting: Neurology

## 2022-07-12 DIAGNOSIS — G43519 Persistent migraine aura without cerebral infarction, intractable, without status migrainosus: Secondary | ICD-10-CM

## 2022-07-12 MED ORDER — PROMETHAZINE HCL 25 MG PO TABS: ORAL_TABLET | ORAL | 0 refills | Status: DC

## 2022-07-12 MED ORDER — ALMOTRIPTAN MALATE 12.5 MG PO TABS: 12.5000 mg | ORAL_TABLET | ORAL | 1 refills | Status: DC | PRN

## 2022-07-17 ENCOUNTER — Telehealth: Payer: Self-pay

## 2022-07-17 NOTE — Telephone Encounter (Signed)
We can order mammogram at the time  of her next visit. She will need to schedule appointment.

## 2022-07-17 NOTE — Telephone Encounter (Signed)
Patient called office regarding message she received via Mychart, patient states that she is interesed in getting Mammogram, right now patient is focusing on her kidney stones and other health concerns, patient states that she will call office back, please advise, thanks!

## 2022-07-17 NOTE — Telephone Encounter (Signed)
Note not needed 

## 2022-08-15 ENCOUNTER — Encounter: Payer: Self-pay | Admitting: Registered Nurse

## 2022-08-15 ENCOUNTER — Encounter: Payer: Medicare Other | Attending: Physical Medicine & Rehabilitation | Admitting: Registered Nurse

## 2022-08-15 VITALS — BP 158/84 | HR 106 | Ht 65.5 in | Wt 156.7 lb

## 2022-08-15 DIAGNOSIS — Z760 Encounter for issue of repeat prescription: Secondary | ICD-10-CM | POA: Diagnosis not present

## 2022-08-15 DIAGNOSIS — M7061 Trochanteric bursitis, right hip: Secondary | ICD-10-CM | POA: Diagnosis not present

## 2022-08-15 DIAGNOSIS — Z5181 Encounter for therapeutic drug level monitoring: Secondary | ICD-10-CM | POA: Insufficient documentation

## 2022-08-15 DIAGNOSIS — M5412 Radiculopathy, cervical region: Secondary | ICD-10-CM | POA: Insufficient documentation

## 2022-08-15 DIAGNOSIS — I25750 Atherosclerosis of native coronary artery of transplanted heart with unstable angina: Secondary | ICD-10-CM

## 2022-08-15 DIAGNOSIS — G894 Chronic pain syndrome: Secondary | ICD-10-CM | POA: Insufficient documentation

## 2022-08-15 DIAGNOSIS — M546 Pain in thoracic spine: Secondary | ICD-10-CM | POA: Insufficient documentation

## 2022-08-15 DIAGNOSIS — M7918 Myalgia, other site: Secondary | ICD-10-CM

## 2022-08-15 DIAGNOSIS — G8929 Other chronic pain: Secondary | ICD-10-CM

## 2022-08-15 DIAGNOSIS — Y92009 Unspecified place in unspecified non-institutional (private) residence as the place of occurrence of the external cause: Secondary | ICD-10-CM | POA: Insufficient documentation

## 2022-08-15 DIAGNOSIS — M47812 Spondylosis without myelopathy or radiculopathy, cervical region: Secondary | ICD-10-CM | POA: Diagnosis not present

## 2022-08-15 DIAGNOSIS — M542 Cervicalgia: Secondary | ICD-10-CM | POA: Diagnosis not present

## 2022-08-15 DIAGNOSIS — M7062 Trochanteric bursitis, left hip: Secondary | ICD-10-CM | POA: Diagnosis not present

## 2022-08-15 DIAGNOSIS — W19XXXD Unspecified fall, subsequent encounter: Secondary | ICD-10-CM | POA: Diagnosis not present

## 2022-08-15 DIAGNOSIS — Z79891 Long term (current) use of opiate analgesic: Secondary | ICD-10-CM | POA: Diagnosis not present

## 2022-08-15 DIAGNOSIS — M797 Fibromyalgia: Secondary | ICD-10-CM | POA: Insufficient documentation

## 2022-08-15 DIAGNOSIS — M5416 Radiculopathy, lumbar region: Secondary | ICD-10-CM | POA: Diagnosis not present

## 2022-08-15 MED ORDER — HYDROCODONE-ACETAMINOPHEN 5-325 MG PO TABS: 1.0000 | ORAL_TABLET | Freq: Three times a day (TID) | ORAL | 0 refills | Status: DC | PRN

## 2022-08-15 NOTE — Progress Notes (Unsigned)
Subjective:    Patient ID: Patty Salas, female    DOB: 05-Aug-1956, 66 y.o.   MRN: [id number]  HPI: Patty Salas is a 66 y.o. female who returns for follow up appointment for chronic pain and medication refill. states *** pain is located in  ***. rates pain ***. current exercise regime is walking and performing stretching exercises.  Patty Salas equivalent is *** MME.   UDS ordered today.      Pain Inventory Average Pain 9 Pain Right Now 10 My pain is constant, sharp, burning, stabbing, tingling, and aching  In the last 24 hours, has pain interfered with the following? General activity 9 Relation with others 8 Enjoyment of life 9 What TIME of day is your pain at its worst? morning , daytime, evening, and night Sleep (in general) Fair  Pain is worse with: walking, bending, sitting, inactivity, standing, and some activites Pain improves with: rest, heat/ice, therapy/exercise, medication, and injections Relief from Meds: 4  Family History  Problem Relation Age of Onset   Cancer Mother        stomach   Migraines Mother    Arthritis Mother    Emphysema Mother    Heart disease Father    Hypertension Father    Cancer Father        colon   Dementia Father    Hypertension Brother    Migraines Brother    Hypertension Brother    Migraines Brother    Alcohol abuse Brother    Bipolar disorder Brother    Migraines Daughter    Arthritis Maternal Grandmother    Cancer Maternal Grandmother        stomach   Diabetes Maternal Grandmother    Stroke Maternal Grandmother    Cancer Maternal Aunt        lung   Emphysema Maternal Aunt    Cancer Paternal Uncle        colon   Hypertension Maternal Aunt    Heart disease Maternal Aunt    Cancer Paternal Uncle        lung   Social History   Socioeconomic History   Marital status: Married    Spouse name: Not on file   Number of children: 1   Years of education: COLLEGE1   Highest education level: Not on file   Occupational History   Occupation: HOUSEWIFE    Employer: UNEMPLOYED  Tobacco Use   Smoking status: Never   Smokeless tobacco: Never  Vaping Use   Vaping Use: Never used  Substance and Sexual Activity   Alcohol use: No   Drug use: No   Sexual activity: Not on file  Other Topics Concern   Not on file  Social History Narrative   Patient is right handed.   Patient drinks caffeine occasionally.   Social Determinants of Health   Financial Resource Strain: Not on file  Food Insecurity: Not on file  Transportation Needs: Not on file  Physical Activity: Not on file  Stress: Not on file  Social Connections: Not on file   Past Surgical History:  Procedure Laterality Date    kidney stones     ABDOMINAL ADHESION SURGERY     ABDOMINAL HYSTERECTOMY  1995   partial- hemmoraged after surgery-stitch came loose   APPENDECTOMY  1993   hemmoraged after surgery- stitch came loose   CERVICAL DISC SURGERY  06/04/2001   with fusion   CHOLECYSTECTOMY  1985   colonoscopy     COLONOSCOPY  CYSTOSCOPY     FOOT SURGERY     lt   INCISIONAL HERNIA REPAIR N/A 04/22/2021   Procedure: LAPAROSCOPIC INCISIONAL HERNIA REPAIR WITH MESH;  Surgeon: Clovis Riley, MD;  Location: [facility];  Service: General;  Laterality: N/A;   jj stent  07/2002   with ureteroscopy, cystoscopy and then removal of stent in office   LAPAROSCOPIC LYSIS OF ADHESIONS N/A 04/22/2021   Procedure: LYSIS OF ADHESIONS;  Surgeon: Clovis Riley, MD;  Location: [facility];  Service: General;  Laterality: N/A;   LITHOTRIPSY     LYMPH NODE BIOPSY Right 03/06/2014   Procedure: right groin LYMPH NODE BIOPSY;  Surgeon: Merrie Roof, MD;  Location: [facility];  Service: General;  Laterality: Right;   LYMPHADENECTOMY N/A 12/29/2015   Procedure: RETROPERITONEAL LYMPHADENECTOMY;  Surgeon: Alexis Frock, MD;  Location: [facility];  Service: Urology;  Laterality: N/A;   NECK SURGERY  2002   ROBOT ASSISTED LAPAROSCOPIC  NEPHRECTOMY Left 12/29/2015   Procedure: XI ROBOTIC ASSISTED LEFT LAPAROSCOPIC NEPHRECTOMY;  Surgeon: Alexis Frock, MD;  Location: [facility];  Service: Urology;  Laterality: Left;   TONSILLECTOMY  1976   Past Surgical History:  Procedure Laterality Date    kidney stones     ABDOMINAL ADHESION SURGERY     ABDOMINAL HYSTERECTOMY  1995   partial- hemmoraged after surgery-stitch came loose   APPENDECTOMY  1993   hemmoraged after surgery- stitch came loose   CERVICAL DISC SURGERY  06/04/2001   with fusion   [place]   colonoscopy     COLONOSCOPY     CYSTOSCOPY     FOOT SURGERY     lt   INCISIONAL HERNIA REPAIR N/A 04/22/2021   Procedure: LAPAROSCOPIC INCISIONAL HERNIA REPAIR WITH MESH;  Surgeon: Clovis Riley, MD;  Location: [facility];  Service: General;  Laterality: N/A;   jj stent  07/2002   with ureteroscopy, cystoscopy and then removal of stent in office   LAPAROSCOPIC LYSIS OF ADHESIONS N/A 04/22/2021   Procedure: LYSIS OF ADHESIONS;  Surgeon: Clovis Riley, MD;  Location: [facility];  Service: General;  Laterality: N/A;   LITHOTRIPSY     LYMPH NODE BIOPSY Right 03/06/2014   Procedure: right groin LYMPH NODE BIOPSY;  Surgeon: Merrie Roof, MD;  Location: [facility];  Service: General;  Laterality: Right;   LYMPHADENECTOMY N/A 12/29/2015   Procedure: RETROPERITONEAL LYMPHADENECTOMY;  Surgeon: Alexis Frock, MD;  [facility];  Service: Urology;  Laterality: N/A;   NECK SURGERY  2002   ROBOT ASSISTED LAPAROSCOPIC NEPHRECTOMY Left 12/29/2015   Procedure: XI ROBOTIC ASSISTED LEFT LAPAROSCOPIC NEPHRECTOMY;  Surgeon: Alexis Frock, MD;  Location[facility];  Service: Urology;  Laterality: Left;   TONSILLECTOMY  1976   Past Medical History:  Diagnosis Date   Anxiety    Arthritis    Asthma    Bipolar 2 disorder ([place])    pt stated, "I have Bipolar 2 and it is remission"   Bipolar affective ([place])    CAD (coronary artery disease), native artery transplanted  heart    Nonobstructive by coronary CTA 08/2021 with less than 25% stenosis in the LAD, RCA and left circumflex.   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   Full dentures    Gout    Headache(784.0)    migraines   Herpesviral infection    Hypertension    IBS (irritable bowel syndrome)    with diarrhea   Lymphadenopathy    Myalgia and myositis, unspecified    PAT (paroxysmal atrial tachycardia)    asymptomatic 10 beat run on event monitor 09/2020   PFO (patent foramen ovale)    Small PFO noted on coronary CTA   Pneumonia    PONV (postoperative nausea and vomiting)    PTSD (post-traumatic stress disorder)    Renal calculi    Restless legs syndrome (RLS)    Thoracic radiculopathy    Vitamin D deficiency    Wears glasses    BP (!) 158/84   Pulse (!) 108   Ht 5' 5.5" (1.664 m)   Wt 156 lb 11.2 oz (71.1 kg)   SpO2 98%   BMI 25.68 kg/m   Opioid Risk Score:   Fall Risk Score:  `1  Depression screen [facility] 2/9     04/20/2022   11:36 AM 03/23/2022    2:16 PM 01/04/2022    9:20 AM 11/23/2021    1:05 PM 10/19/2021    1:29 PM 10/05/2021    9:16 AM 09/06/2021   11:11 AM  Depression screen PHQ 2/9  Decreased Interest 1 1 0 1 0 1 0  Down, Depressed, Hopeless 1 1 0 1 0 0 0  PHQ - 2 Score 2 2 0 2 0 1 0  Altered sleeping     0 0 0  Tired, decreased energy     '1 1 1  '$ Change in appetite     0 1 0  Feeling bad or failure about yourself      0 0 0  Trouble concentrating     1 1 0  Moving slowly or fidgety/restless     0 0 1  Suicidal thoughts     0 0 0  PHQ-9 Score     '2 4 2    '$ Review of Systems  Musculoskeletal:  Positive for arthralgias, back pain and gait problem.  Neurological:  Positive for headaches.  All other systems reviewed and are  negative.      Objective:   Physical Exam        Assessment & Plan:  1 History of fibromyalgia with myofascial pain and multiple trigger points.Continue with Heat and exercise Regime. Continue with current medication regimen with  gabapentin. 06/12/2022 2 Chronic migraine headaches.Continue to Monitor. S/P Botox.on 05/30/2017. 06/12/2022 3. Midline Low Back Pain/ Lumbar Spondylosis/Lumbar degenerative disk disease, L4-5/ Lumbar Radiculitis: Continue with HEP and current medication regimen. Continue Hydrocodone 5/325 mg one tablet every 8 hours as needed #80. Marland KitchenContinue with slow weaning of Hydrocodone. Second script sent for the following month to accommodate scheduled appointment . 06/12/2022 4. 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 Urology Following. 06/12/2022. 5. 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 06/12/2022 6. Cervicalgia/ Cervical RadiculitisCervical Spondylosis:  Continue current medication regiment with  Gabapentin. Continue  with HEP and Continue to monitor. 06/12/2022 7. Muscle Spasm: Myofascial Pain: Patty Salas is svcheduled for Trigger Point Injection with Dr Naaman Plummer : Continue current medication regimen with Flexeril as needed. 06/12/2022 8. Bilateral Greater Trochanter Bursitis: Continue to alternate with ice and heat therapy. Continue HEP as Tolerated. Continue to monitor. 06/12/2022 9. Bilateral Knee Pain: S/P Knee Injection by Dr Sharol Given on  02/23/2020. Orthopedics Following. Continue to Monitor. 06/12/2022  10. Left Groin Pain: No complaints today.Patty Salas reports Oncology Following. Continue to Monitor. 04/20/2022.  72. Fall at Home: Educated on falls prevention, Patty Salas verbalizes understanding. Continue to Monitor.   12. Patty Salas Tenosynovitis:  RX: Medrol Dose Pak. Continue to Monitor.    F/U in 1 month

## 2022-08-18 LAB — TOXASSURE SELECT,+ANTIDEPR,UR

## 2022-08-21 ENCOUNTER — Telehealth: Payer: Self-pay | Admitting: Neurology

## 2022-08-21 MED ORDER — PROMETHAZINE HCL 25 MG PO TABS: ORAL_TABLET | ORAL | 0 refills | Status: DC

## 2022-08-21 NOTE — Telephone Encounter (Signed)
Pt is calling requesting a refill on medication    promethazine (PHENERGAN) 25 MG tablet.Refill should be sent to [facility] [phone]

## 2022-08-21 NOTE — Telephone Encounter (Signed)
Reviewed chart and refill is appropriate/due.  I have sent to requested pharmacy.

## 2022-08-23 ENCOUNTER — Other Ambulatory Visit: Payer: Self-pay | Admitting: Nurse Practitioner

## 2022-08-23 ENCOUNTER — Telehealth: Payer: Self-pay | Admitting: *Deleted

## 2022-08-23 NOTE — Telephone Encounter (Signed)
Urine drug screen for this encounter is consistent for prescribed medication 

## 2022-08-28 ENCOUNTER — Other Ambulatory Visit: Payer: Self-pay | Admitting: Nurse Practitioner

## 2022-09-12 ENCOUNTER — Telehealth: Payer: Self-pay | Admitting: *Deleted

## 2022-09-12 NOTE — Telephone Encounter (Signed)
Patient called and left message called her back and she said she was having sore throat, chills, fever, white splotches in throat, runny nose.  Scheduled her an appointment on 09/14/2022 at 2:50pm.  She is wanting to see about doing a virtual visit and not come in the office.  She said she could come sit in parking lot if you need to do swabs and I told her I would see if provider would be willing to do this. Please advise. Shaune Malacara Zimmerman Rumple, CMA

## 2022-09-12 NOTE — Telephone Encounter (Signed)
I think a virtual appointment would be fine for this. Can she do a home test for COVID? How long has she had symptoms?

## 2022-09-13 NOTE — Telephone Encounter (Signed)
Thank you :)

## 2022-09-13 NOTE — Telephone Encounter (Signed)
Onset of sxs Monday night 09/11/22.   Vomiting, fever 101.7, Diarrhea, cough, headache, lymph nodes around neck swollen, Dizzy, sore throat, white spots in back of throat.  NEG COVID 09/12/22

## 2022-09-13 NOTE — Telephone Encounter (Signed)
I feel like, with all of these symptoms, she should go to ED. She is probably dehydrated and may need fluids. Sounds like she might have the flu. Honestly, my advice is ED. At least urgent care.

## 2022-09-14 ENCOUNTER — Ambulatory Visit: Payer: Medicare Other | Admitting: Nurse Practitioner

## 2022-09-15 ENCOUNTER — Other Ambulatory Visit: Payer: Self-pay | Admitting: Neurology

## 2022-09-15 ENCOUNTER — Telehealth: Payer: Self-pay

## 2022-09-15 MED ORDER — METHYLPREDNISOLONE 4 MG PO TBPK: ORAL_TABLET | ORAL | 0 refills | Status: DC

## 2022-09-15 NOTE — Addendum Note (Signed)
Addended by: Bayard Hugger on: 09/15/2022 01:40 PM   Modules accepted: Orders

## 2022-09-15 NOTE — Telephone Encounter (Signed)
Patient requesting medication refill on hydrocodone last fill per pmp was 08/18/22  Also [facility]obenz[facility] [address].  08/18/2022 08/15/2022 1  Hydrocodone-Acetamin 5-325 Mg 80.00 26 Eu Tho [id number] [facility] ([facility]) 0/0 15.38 MME Medicare [place]

## 2022-09-15 NOTE — Telephone Encounter (Signed)
Patient also requesting a refill on methyl prednisone tablets for her hand.

## 2022-09-15 NOTE — Telephone Encounter (Signed)
Medication List Reviewed.  Medrol dose -pak sent to pharmacy

## 2022-09-19 ENCOUNTER — Telehealth: Payer: Self-pay | Admitting: Physical Medicine & Rehabilitation

## 2022-09-19 DIAGNOSIS — M797 Fibromyalgia: Secondary | ICD-10-CM

## 2022-09-19 DIAGNOSIS — G894 Chronic pain syndrome: Secondary | ICD-10-CM

## 2022-09-19 MED ORDER — HYDROCODONE-ACETAMINOPHEN 5-325 MG PO TABS: 1.0000 | ORAL_TABLET | Freq: Three times a day (TID) | ORAL | 0 refills | Status: DC | PRN

## 2022-09-19 MED ORDER — CYCLOBENZAPRINE HCL 10 MG PO TABS: 10.0000 mg | ORAL_TABLET | Freq: Three times a day (TID) | ORAL | 4 refills | Status: DC

## 2022-09-19 NOTE — Telephone Encounter (Signed)
Patient sick and needs refills on [facility]aprine ([facility]) and Hydrocodone ([place]).  Please call patient when this is complete. Patient canceled appointment with Dr. Naaman Plummer for 09/20/22.

## 2022-09-19 NOTE — Telephone Encounter (Signed)
PMP was Reviewed.  Hydrocodone e-scribed to pharmacy and [facility]. My-Chart message sent to Patty Salas regarding the above.

## 2022-09-20 ENCOUNTER — Telehealth: Payer: Self-pay | Admitting: Neurology

## 2022-09-20 ENCOUNTER — Encounter: Payer: Medicare Other | Admitting: Physical Medicine & Rehabilitation

## 2022-09-20 DIAGNOSIS — M797 Fibromyalgia: Secondary | ICD-10-CM

## 2022-09-20 DIAGNOSIS — G894 Chronic pain syndrome: Secondary | ICD-10-CM

## 2022-09-20 MED ORDER — PROMETHAZINE HCL 25 MG PO TABS: ORAL_TABLET | ORAL | 0 refills | Status: DC

## 2022-09-20 NOTE — Addendum Note (Signed)
Addended by: Verlin Grills on: 09/20/2022 01:36 PM   Modules accepted: Orders

## 2022-09-20 NOTE — Telephone Encounter (Signed)
Pt is asking for a call with the status of her refill request for promethazine (PHENERGAN) 25 MG tablet to [facility] [phone]

## 2022-09-20 NOTE — Telephone Encounter (Signed)
Refill sent as requested. 

## 2022-09-20 NOTE — Telephone Encounter (Signed)
error 

## 2022-10-17 ENCOUNTER — Telehealth: Payer: Self-pay | Admitting: Registered Nurse

## 2022-10-17 ENCOUNTER — Telehealth: Payer: Self-pay | Admitting: Neurology

## 2022-10-17 DIAGNOSIS — G894 Chronic pain syndrome: Secondary | ICD-10-CM

## 2022-10-17 DIAGNOSIS — M797 Fibromyalgia: Secondary | ICD-10-CM

## 2022-10-17 MED ORDER — PROMETHAZINE HCL 25 MG PO TABS: ORAL_TABLET | ORAL | 0 refills | Status: DC

## 2022-10-17 NOTE — Telephone Encounter (Signed)
I called the patient, under a lot of stress with her husband, I would like to reduce the phenergan down to 15 tablets a month, she takes for migraine headache with nausea. She currently has the flu. We will do 1 more month of 20 tablets.   Meds ordered this encounter  Medications   promethazine (PHENERGAN) 25 MG tablet    Sig: Take 1/2 tablet by mouth every 6 hours as needed for Nausea and Vomiting    Dispense:  20 tablet    Refill:  0    This is a 1 month supply

## 2022-10-17 NOTE — Telephone Encounter (Signed)
The patient called and rescheduled her appointment for this Thursday to next Friday due to being sick.

## 2022-10-17 NOTE — Addendum Note (Signed)
Addended by: Suzzanne Cloud on: 10/17/2022 04:33 PM   Modules accepted: Orders

## 2022-10-17 NOTE — Telephone Encounter (Signed)
Pt is calling. Requesting a refill on promethazine (PHENERGAN) 25 MG tablet. Refill should be sent to [facility] [phone].

## 2022-10-18 ENCOUNTER — Telehealth: Payer: Self-pay

## 2022-10-18 MED ORDER — HYDROCODONE-ACETAMINOPHEN 5-325 MG PO TABS: 1.0000 | ORAL_TABLET | Freq: Three times a day (TID) | ORAL | 0 refills | Status: DC | PRN

## 2022-10-18 NOTE — Patient Instructions (Signed)
Visit Information  Thank you for taking time to visit with me today. Please don't hesitate to contact me if I can be of assistance to you.   Following are the goals we discussed today:   Goals Addressed             This Visit's Progress    COMPLETED: Care coordination Activities-No follow up required       Care Coordination Interventions: Advised patient to Annual Wellness exam. Discussed [facility] services and support. Assessed SDOH. Advised to discuss with primary care physician if services needed in the future.          If you are experiencing a Mental Health or [place] or need someone to talk to, please call the Suicide and Crisis Lifeline: 988   Patient verbalizes understanding of instructions and care plan provided today and agrees to view in [place]. Active MyChart status and patient understanding of how to access instructions and care plan via MyChart confirmed with patient.     No further follow up required:    Jone Baseman, RN, MSN [place] [facility] Care Management Coordinator Direct Line [phone]

## 2022-10-18 NOTE — Telephone Encounter (Signed)
Pt is requesting a refill on: albuterol (VENTOLIN HFA) 108 (90 Base) MCG/ACT inhaler   [id number]   LOV 10/19/21 ROV 11/23/22

## 2022-10-18 NOTE — Patient Outreach (Signed)
  Care Coordination   Initial Visit Note   10/18/2022 Name: Patty Salas MRN: [id number] DOB: 1955-10-25  Patty Salas is a 67 y.o. year old female who sees Junius Creamer, Greer Ee, NP for primary care. I spoke with  Buddy Duty by phone today.  What matters to the patients health and wellness today?  none    Goals Addressed             This Visit's Progress    COMPLETED: Care coordination Activities-No follow up required       Care Coordination Interventions: Advised patient to Annual Wellness exam. Discussed [facility] services and support. Assessed SDOH. Advised to discuss with primary care physician if services needed in the future.        SDOH assessments and interventions completed:  Yes  SDOH Interventions Today    Flowsheet Row Most Recent Value  SDOH Interventions   Food Insecurity Interventions Intervention Not Indicated  Housing Interventions Intervention Not Indicated        Care Coordination Interventions:  Yes, provided   Follow up plan: No further intervention required.   Encounter Outcome:  Pt. Visit Completed   Jone Baseman, RN, MSN [place] [facility] Care Management Coordinator Direct Line [phone]

## 2022-10-18 NOTE — Telephone Encounter (Signed)
PMP was Reviewed.  Patty Salas called office to re-scheduled her appointment. Hydrocodone e-scribed to pharmacy, Patty Salas is aware via My-Chart message.

## 2022-10-19 ENCOUNTER — Encounter: Payer: Medicare Other | Admitting: Registered Nurse

## 2022-10-19 ENCOUNTER — Other Ambulatory Visit: Payer: Self-pay

## 2022-10-19 DIAGNOSIS — R062 Wheezing: Secondary | ICD-10-CM

## 2022-10-19 MED ORDER — ALBUTEROL SULFATE HFA 108 (90 BASE) MCG/ACT IN AERS: 2.0000 | INHALATION_SPRAY | Freq: Four times a day (QID) | RESPIRATORY_TRACT | 1 refills | Status: DC | PRN

## 2022-10-27 ENCOUNTER — Encounter: Payer: Medicare Other | Admitting: Registered Nurse

## 2022-11-07 ENCOUNTER — Encounter: Payer: Medicare Other | Admitting: Registered Nurse

## 2022-11-09 ENCOUNTER — Encounter: Payer: Medicare Other | Admitting: Registered Nurse

## 2022-11-14 NOTE — Telephone Encounter (Signed)
Note not needed 

## 2022-11-17 ENCOUNTER — Encounter: Payer: Medicare Other | Attending: Physical Medicine & Rehabilitation | Admitting: Registered Nurse

## 2022-11-17 ENCOUNTER — Encounter: Payer: Self-pay | Admitting: Registered Nurse

## 2022-11-17 VITALS — BP 144/77 | HR 100 | Ht 65.5 in | Wt 153.0 lb

## 2022-11-17 DIAGNOSIS — M5416 Radiculopathy, lumbar region: Secondary | ICD-10-CM | POA: Diagnosis present

## 2022-11-17 DIAGNOSIS — M7061 Trochanteric bursitis, right hip: Secondary | ICD-10-CM | POA: Diagnosis present

## 2022-11-17 DIAGNOSIS — G894 Chronic pain syndrome: Secondary | ICD-10-CM | POA: Insufficient documentation

## 2022-11-17 DIAGNOSIS — M797 Fibromyalgia: Secondary | ICD-10-CM | POA: Insufficient documentation

## 2022-11-17 DIAGNOSIS — Z79891 Long term (current) use of opiate analgesic: Secondary | ICD-10-CM | POA: Insufficient documentation

## 2022-11-17 DIAGNOSIS — M7918 Myalgia, other site: Secondary | ICD-10-CM | POA: Insufficient documentation

## 2022-11-17 DIAGNOSIS — G8929 Other chronic pain: Secondary | ICD-10-CM | POA: Diagnosis present

## 2022-11-17 DIAGNOSIS — M5412 Radiculopathy, cervical region: Secondary | ICD-10-CM | POA: Diagnosis not present

## 2022-11-17 DIAGNOSIS — M546 Pain in thoracic spine: Secondary | ICD-10-CM | POA: Diagnosis not present

## 2022-11-17 DIAGNOSIS — M7062 Trochanteric bursitis, left hip: Secondary | ICD-10-CM | POA: Insufficient documentation

## 2022-11-17 DIAGNOSIS — Z5181 Encounter for therapeutic drug level monitoring: Secondary | ICD-10-CM | POA: Diagnosis present

## 2022-11-17 DIAGNOSIS — M542 Cervicalgia: Secondary | ICD-10-CM | POA: Insufficient documentation

## 2022-11-17 DIAGNOSIS — M47812 Spondylosis without myelopathy or radiculopathy, cervical region: Secondary | ICD-10-CM | POA: Diagnosis not present

## 2022-11-17 MED ORDER — HYDROCODONE-ACETAMINOPHEN 5-325 MG PO TABS: 1.0000 | ORAL_TABLET | Freq: Three times a day (TID) | ORAL | 0 refills | Status: DC | PRN

## 2022-11-17 NOTE — Progress Notes (Signed)
Subjective:    Patient ID: Patty Salas, female    DOB: 04-14-1956, 67 y.o.   MRN: [id number]  HPI: Patty Salas is a 67 y.o. female who returns for follow up appointment for chronic pain and medication refill. She states her pain is located in her neck radiating into her bilateral shoulders, mid- lower back pain radiating into her bilateral hips and bilateral lower extremities. She  rates her pain 9. Her current exercise regime is walking and performing stretching exercises.  Patty Salas arrived to office with Blood Pressure 144/79, blood pressure was re-checked. She's not on anti-hypertensive medication. Cardiology is following she reports.   Patty Salas Morphine equivalent is 13.33 MME. She is also prescribed Lorazepam  by Dr. Reece Levy .We have discussed the black box warning of using opioids and benzodiazepines. I highlighted the dangers of using these drugs together and discussed the adverse events including respiratory suppression, overdose, cognitive impairment and importance of compliance with current regimen. We will continue to monitor and adjust as indicated.  she is being closely monitored and under the care of her psychiatrist.   Last UDS was Performed on 08/15/2022, it was consistent.    Pain Inventory Average Pain 8 Pain Right Now 9 My pain is constant, sharp, burning, stabbing, tingling, and aching  In the last 24 hours, has pain interfered with the following? General activity 9 Relation with others 9 Enjoyment of life 8 What TIME of day is your pain at its worst? morning , daytime, evening, and night Sleep (in general) Fair  Pain is worse with: walking, bending, sitting, inactivity, standing, and some activites Pain improves with: rest, heat/ice, therapy/exercise, medication, and injections Relief from Meds: 5  Family History  Problem Relation Age of Onset   Cancer Mother        stomach   Migraines Mother    Arthritis Mother    Emphysema Mother    Heart disease  Father    Hypertension Father    Cancer Father        colon   Dementia Father    Hypertension Brother    Migraines Brother    Hypertension Brother    Migraines Brother    Alcohol abuse Brother    Bipolar disorder Brother    Migraines Daughter    Arthritis Maternal Grandmother    Cancer Maternal Grandmother        stomach   Diabetes Maternal Grandmother    Stroke Maternal Grandmother    Cancer Maternal Aunt        lung   Emphysema Maternal Aunt    Cancer Paternal Uncle        colon   Hypertension Maternal Aunt    Heart disease Maternal Aunt    Cancer Paternal Uncle        lung   Social History   Socioeconomic History   Marital status: Married    Spouse name: Not on file   Number of children: 1   Years of education: COLLEGE1   Highest education level: Not on file  Occupational History   Occupation: HOUSEWIFE    Employer: UNEMPLOYED  Tobacco Use   Smoking status: Never   Smokeless tobacco: Never  Vaping Use   Vaping Use: Never used  Substance and Sexual Activity   Alcohol use: No   Drug use: No   Sexual activity: Not on file  Other Topics Concern   Not on file  Social History Narrative   Patient is right handed.  Patient drinks caffeine occasionally.   Social Determinants of Health   Financial Resource Strain: Not on file  Food Insecurity: No Food Insecurity (10/18/2022)   Hunger Vital Sign    Worried About Running Out of Food in the Last Year: Never true    Ran Out of Food in the Last Year: Never true  Transportation Needs: Not on file  Physical Activity: Not on file  Stress: Not on file  Social Connections: Not on file   Past Surgical History:  Procedure Laterality Date    kidney stones     ABDOMINAL ADHESION SURGERY     ABDOMINAL HYSTERECTOMY  1995   partial- hemmoraged after surgery-stitch came loose   APPENDECTOMY  1993   hemmoraged after surgery- stitch came loose   CERVICAL DISC SURGERY  06/04/2001   with fusion   [place]    colonoscopy     COLONOSCOPY     CYSTOSCOPY     FOOT SURGERY     lt   [place] N/A 04/22/2021   Procedure: LAPAROSCOPIC INCISIONAL HERNIA REPAIR WITH MESH;  Surgeon: Clovis Riley, MD;  Location: [facility];  Service: General;  Laterality: N/A;   jj stent  07/2002   with ureteroscopy, cystoscopy and then removal of stent in office   LAPAROSCOPIC LYSIS OF ADHESIONS N/A 04/22/2021   Procedure: LYSIS OF ADHESIONS;  Surgeon: Clovis Riley, MD;  Location: [facility];  Service: General;  Laterality: N/A;   LITHOTRIPSY     LYMPH NODE BIOPSY Right 03/06/2014   Procedure: right groin LYMPH NODE BIOPSY;  Surgeon: Merrie Roof, MD;  Location: [place];  Service: General;  Laterality: Right;   LYMPHADENECTOMY N/A 12/29/2015   Procedure: RETROPERITONEAL LYMPHADENECTOMY;  Surgeon: Alexis Frock, MD;  Location: [facility];  Service: Urology;  Laterality: N/A;   NECK SURGERY  2002   ROBOT ASSISTED LAPAROSCOPIC NEPHRECTOMY Left 12/29/2015   Procedure: XI ROBOTIC ASSISTED LEFT LAPAROSCOPIC NEPHRECTOMY;  Surgeon: Alexis Frock, MD;  Location: [facility];  Service: Urology;  Laterality: Left;   TONSILLECTOMY  1976   Past Surgical History:  Procedure Laterality Date    kidney stones     ABDOMINAL ADHESION SURGERY     ABDOMINAL HYSTERECTOMY  1995   partial- hemmoraged after surgery-stitch came loose   APPENDECTOMY  1993   hemmoraged after surgery- stitch came loose   CERVICAL DISC SURGERY  06/04/2001   with fusion   [place]   colonoscopy     COLONOSCOPY     CYSTOSCOPY     FOOT SURGERY     lt   INCISIONAL HERNIA REPAIR N/A 04/22/2021   Procedure: LAPAROSCOPIC INCISIONAL HERNIA REPAIR WITH MESH;  Surgeon: Clovis Riley, MD;  Location: [facility];  Service: General;  Laterality: N/A;   jj stent  07/2002   with ureteroscopy, cystoscopy and then removal of stent in office   LAPAROSCOPIC LYSIS OF ADHESIONS N/A 04/22/2021   Procedure: LYSIS OF ADHESIONS;  Surgeon:  Clovis Riley, MD;  Location: [facility];  Service: General;  Laterality: N/A;   LITHOTRIPSY     LYMPH NODE BIOPSY Right 03/06/2014   Procedure: right groin LYMPH NODE BIOPSY;  Surgeon: Merrie Roof, MD;  Location: [place];  Service: General;  Laterality: Right;   LYMPHADENECTOMY N/A 12/29/2015   Procedure: RETROPERITONEAL LYMPHADENECTOMY;  Surgeon: Alexis Frock, MD;  Location: [facility];  Service: Urology;  Laterality: N/A;   NECK SURGERY  2002  ROBOT ASSISTED LAPAROSCOPIC NEPHRECTOMY Left 12/29/2015   Procedure: XI ROBOTIC ASSISTED LEFT LAPAROSCOPIC NEPHRECTOMY;  Surgeon: Alexis Frock, MD;  Location: [facility];  Service: Urology;  Laterality: Left;   TONSILLECTOMY  1976   Past Medical History:  Diagnosis Date   Anxiety    Arthritis    Asthma    Bipolar 2 disorder ([place])    pt stated, "I have Bipolar 2 and it is remission"   Bipolar affective ([place])    CAD (coronary artery disease), native artery transplanted heart    Nonobstructive by coronary CTA 08/2021 with less than 25% stenosis in the LAD, RCA and left circumflex.   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   Full dentures    Gout    Headache(784.0)    migraines   Herpesviral infection    Hypertension    IBS (irritable bowel syndrome)    with diarrhea   Lymphadenopathy    Myalgia and myositis, unspecified    PAT (paroxysmal atrial tachycardia)    asymptomatic 10 beat run on event monitor 09/2020   PFO (patent foramen ovale)    Small PFO noted on coronary CTA   Pneumonia    PONV (postoperative nausea and vomiting)    PTSD (post-traumatic stress disorder)    Renal calculi    Restless legs syndrome (RLS)    Thoracic radiculopathy    Vitamin D deficiency    Wears glasses     There were no vitals taken for this visit.  Opioid Risk Score:   Fall Risk Score:  `1  Depression screen [facility] 2/9     10/18/2022   10:32 AM 08/15/2022    1:04 PM 04/20/2022   11:36 AM 03/23/2022    2:16 PM 01/04/2022    9:20 AM 11/23/2021    1:05 PM 10/19/2021    1:29 PM  Depression screen PHQ 2/9  Decreased Interest 0 1 1 1 $ 0 1 0  Down, Depressed, Hopeless 1 1 1 1 $ 0 1 0  PHQ - 2 Score 1 2 2 2 $ 0 2 0  Altered sleeping       0  Tired, decreased energy       1  Change in appetite       0  Feeling bad or failure about yourself        0  Trouble concentrating       1  Moving slowly or fidgety/restless       0  Suicidal thoughts       0  PHQ-9 Score       2      Review of Systems  Musculoskeletal:  Positive for back pain, gait problem and neck pain.  All other systems reviewed and are negative.     Objective:   Physical Exam Vitals and nursing note reviewed.  Constitutional:      Appearance: Normal appearance.  Neck:     Comments: Cervical Paraspinal Tenderness: C-5-C-6 Cardiovascular:     Rate and Rhythm: Normal rate and regular rhythm.     Pulses: Normal pulses.     Heart sounds: Normal heart sounds.  Pulmonary:     Effort: Pulmonary effort is normal.  Breath sounds: Normal breath sounds.  Musculoskeletal:     Cervical back: Normal range of motion and neck supple.     Comments: Normal Muscle Bulk and Muscle Testing Reveals:  Upper Extremities: Full ROM and Muscle Strength 5/5 Bilateral AC Joint Tenderness Thoracic Paraspinal Tenderness: T-7-T-9 Lumbar Paraspinal Tenderness: L-4-L-5  Greater Trochanter Tenderness  Lower Extremities: Full ROM and Muscle Strength 5/5 Arises from Table slowly using walker for support Narrow Based  Gait     Skin:    General: Skin is warm and dry.  Neurological:     Mental Status: She is alert and oriented to person, place, and time.  Psychiatric:        Mood and Affect: Mood normal.        Behavior: Behavior normal.          Assessment & Plan:  1 History of fibromyalgia with myofascial pain and multiple trigger points.Continue with Heat and exercise Regime. Continue with current medication regimen with  gabapentin. 11/17/2022 2 Chronic migraine headaches.Continue to Monitor. S/P Botox.on 05/30/2017. 11/17/2022 3. Midline Low Back Pain/ Lumbar Spondylosis/Lumbar degenerative disk disease, L4-5/ Lumbar Radiculitis: Continue with HEP and current medication regimen. Continue Hydrocodone 5/325 mg one tablet every 8 hours as needed #80. Marland KitchenContinue with slow weaning of Hydrocodone. Second script sent for the following month to accommodate scheduled appointment . 11/17/2022 4. 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 Urology Following. 11/17/2022. 5. 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 11/17/2022 6. Cervicalgia/ Cervical RadiculitisCervical Spondylosis:  Continue current medication regiment with  Gabapentin. Continue  with HEP and Continue to monitor. 11/17/2022 7. Muscle Spasm: Myofascial Pain: She is svcheduled for Trigger Point Injection with Dr Naaman Plummer : Continue current medication regimen with Flexeril as needed. 11/17/2022 8. Bilateral Greater Trochanter Bursitis: Continue to alternate with ice and heat therapy. Continue HEP as Tolerated. Continue to monitor. 11/17/2022 9. Bilateral Knee Pain: S/P Knee Injection by Dr Sharol Given on 02/23/2020. Orthopedics Following. Continue to Monitor. 11/17/2022  10. Left Groin Pain: No complaints today.Ms. Rudd reports Oncology Following. Continue to Monitor. 11/17/2022.  85. Fall at Home: No Falls this month. Educated on falls prevention, she verbalizes understanding. Continue to Monitor. 11/17/2022  12. Alfonse Ras Tenosynovitis: No Complaints today. Continue to Monitor. 11/17/2022   F/U in 1 month

## 2022-11-22 ENCOUNTER — Telehealth: Payer: Self-pay

## 2022-11-22 ENCOUNTER — Telehealth: Payer: Self-pay | Admitting: Gastroenterology

## 2022-11-22 NOTE — Telephone Encounter (Signed)
Request received to transfer GI care from outside practice to [facility].  We appreciate the interest in our practice, however at this time due to capacity and high demand from patients without established GI providers we cannot accommodate this transfer.

## 2022-11-22 NOTE — Telephone Encounter (Signed)
Pt is calling to get a new referral to [facility] due to current provider is retiring.

## 2022-11-22 NOTE — Telephone Encounter (Signed)
Good afternoon Dr. Fuller Plan  The following patient is requesting a transfer to [place] from [place]. She currently has no ongoing issues but she has heard nothing but good things about you and would rather be under your care. 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 Records are in Epic for review. Please review and advise of scheduling. Thank you.

## 2022-11-23 ENCOUNTER — Ambulatory Visit: Payer: Medicare Other | Admitting: Nurse Practitioner

## 2022-11-23 NOTE — Telephone Encounter (Signed)
Is this for screening colonoscopy or for something different?

## 2022-11-23 NOTE — Telephone Encounter (Signed)
Patient advised.

## 2022-11-25 ENCOUNTER — Other Ambulatory Visit: Payer: Self-pay | Admitting: Cardiology

## 2022-11-28 ENCOUNTER — Other Ambulatory Visit: Payer: Self-pay | Admitting: Neurology

## 2022-11-28 MED ORDER — PROMETHAZINE HCL 25 MG PO TABS: ORAL_TABLET | ORAL | 0 refills | Status: DC

## 2022-11-28 NOTE — Addendum Note (Signed)
Addended by: Reyne Dumas on: 11/28/2022 04:22 PM   Modules accepted: Orders

## 2022-11-28 NOTE — Telephone Encounter (Signed)
Last filled on 10/17/2022.

## 2022-11-28 NOTE — Telephone Encounter (Signed)
Pt is requesting a refill for promethazine (PHENERGAN) 25 MG tablet  .(Pt asked that it be the 15 amount not 20)  Pharmacy:  [facility] [phone]

## 2022-11-30 ENCOUNTER — Encounter: Payer: Self-pay | Admitting: Nurse Practitioner

## 2022-11-30 ENCOUNTER — Ambulatory Visit (INDEPENDENT_AMBULATORY_CARE_PROVIDER_SITE_OTHER): Payer: Medicare Other | Admitting: Nurse Practitioner

## 2022-11-30 VITALS — BP 134/74 | HR 97 | Ht 65.0 in | Wt 156.4 lb

## 2022-11-30 DIAGNOSIS — I10 Essential (primary) hypertension: Secondary | ICD-10-CM | POA: Diagnosis not present

## 2022-11-30 DIAGNOSIS — T466X5A Adverse effect of antihyperlipidemic and antiarteriosclerotic drugs, initial encounter: Secondary | ICD-10-CM

## 2022-11-30 DIAGNOSIS — G43519 Persistent migraine aura without cerebral infarction, intractable, without status migrainosus: Secondary | ICD-10-CM | POA: Diagnosis not present

## 2022-11-30 DIAGNOSIS — G720 Drug-induced myopathy: Secondary | ICD-10-CM | POA: Diagnosis not present

## 2022-11-30 NOTE — Progress Notes (Signed)
Established patient visit   Patient: Patty Salas   DOB: 02/08/1956   67 y.o. Female  MRN: [id number] Visit Date: 11/30/2022   Chief Complaint  Patient presents with   Medical Management of Chronic Issues   Subjective    HPI  Follow up -hypertension  --well managed with current medication.  -goes to pain center.  -has been on prednisone taper off and on - concern for weight gain  -sees cardiology  -has neurology for migraines  -sees functional medicine (Dr. Tessa Lerner) Sees psychiatry   She denies chest pain, chest pressure, or shortness of breath. She denies headaches or visual disturbances. She denies abdominal pain, nausea, vomiting, or changes in bowel or bladder habits.    Medications: Outpatient Medications Prior to Visit  Medication Sig   acetaminophen (TYLENOL) 500 MG tablet Take 500 mg by mouth every 6 (six) hours as needed for moderate pain.   albuterol (VENTOLIN HFA) 108 (90 Base) MCG/ACT inhaler Inhale 2 puffs into the lungs every 6 (six) hours as needed for wheezing.   almotriptan (AXERT) 12.5 MG tablet Take 1 tablet (12.5 mg total) by mouth as needed for migraine. TAKE 1 TABLET BY MOUTH AS NEEDED FOR MIGRAINE (MAX 2 TABS PER WEEK). THIS IS 90-DAY RX. PAYS CASH   bisacodyl (DULCOLAX) 5 MG EC tablet Take 10 mg by mouth daily as needed for mild constipation.    cyclobenzaprine (FLEXERIL) 10 MG tablet Take 1 tablet (10 mg total) by mouth 3 (three) times daily.   docusate sodium (COLACE) 100 MG capsule Take 100 mg by mouth daily as needed for mild constipation.    EPINEPHRINE 0.3 mg/0.3 mL IJ SOAJ injection INJECT 0.3 MG INTO THE MUSCLE AS NEEDED FOR ANAPHYLAXIS.   fexofenadine (ALLEGRA) 180 MG tablet Take 1 tablet (180 mg total) by mouth daily.   furosemide (LASIX) 20 MG tablet TAKE 1 TABLET (20 MG TOTAL) BY MOUTH DAILY AS NEEDED FOR EDEMA.   gabapentin (NEURONTIN) 300 MG capsule Take 2 capsules by mouth at bedtime.   hyoscyamine (ANASPAZ) 0.125 MG TBDP disintergrating  tablet hyoscyamine 0.125 mg disintegrating tablet  TAKE 1 TABLET BY MOUTH 4 TIMES DAILY AS NEEDED FOR UP TO 60 DOSES (CRAMPING, PAIN)   Loperamide HCl (IMODIUM A-D PO)    LORazepam (ATIVAN) 1 MG tablet Take 1 tablet by mouth 3 (three) times daily.   promethazine (PHENERGAN) 25 MG tablet Take 1/2 tablet by mouth every 6 hours as needed for Nausea and Vomiting   Trospium Chloride 60 MG CP24 Take 1 capsule by mouth every morning.    [DISCONTINUED] HYDROcodone-acetaminophen (NORCO/VICODIN) 5-325 MG tablet Take 1 tablet by mouth every 8 (eight) hours as needed for moderate pain.   [DISCONTINUED] methylPREDNISolone (MEDROL DOSEPAK) 4 MG TBPK tablet Use as directed   No facility-administered medications prior to visit.    Review of Systems See HPI    Last CBC Lab Results  Component Value Date   WBC 5.4 03/30/2022   HGB 12.2 03/30/2022   HCT 37.2 03/30/2022   MCV 95.6 03/30/2022   MCH 31.4 03/30/2022   RDW 12.8 03/30/2022   PLT 282 0000000   Last metabolic panel Lab Results  Component Value Date   GLUCOSE 85 03/30/2022   NA 140 03/30/2022   K 3.6 03/30/2022   CL 98 03/30/2022   CO2 36 (H) 03/30/2022   BUN 11 03/30/2022   CREATININE 0.88 03/30/2022   GFRNONAA >60 03/30/2022   CALCIUM 10.0 03/30/2022   PHOS 3.1 08/28/2017  PROT 8.0 03/30/2022   ALBUMIN 4.4 03/30/2022   LABGLOB 3.1 03/24/2020   AGRATIO 1.4 03/24/2020   BILITOT 0.4 03/30/2022   ALKPHOS 73 03/30/2022   AST 28 03/30/2022   ALT 14 03/30/2022   ANIONGAP 6 03/30/2022   Last lipids Lab Results  Component Value Date   CHOL 264 (H) 08/18/2021   HDL 58 08/18/2021   LDLCALC 159 (H) 08/18/2021   TRIG 255 (H) 08/18/2021   CHOLHDL 4.6 (H) 08/18/2021   Last hemoglobin A1c Lab Results  Component Value Date   HGBA1C 5.7 (H) 08/28/2017   Last thyroid functions Lab Results  Component Value Date   TSH 1.010 03/24/2020   Last vitamin D Lab Results  Component Value Date   VD25OH 33.38 03/30/2022        Objective     Today's Vitals   11/30/22 1128 11/30/22 1135  BP: (Abnormal) 152/85 134/74  Pulse: (Abnormal) 104 97  SpO2: 95%   Weight: 156 lb 6.4 oz (70.9 kg)   Height: 5\' 5"  (1.651 m)    Body mass index is 26.03 kg/m.  BP Readings from Last 3 Encounters:  12/15/22 137/69  11/30/22 134/74  11/17/22 (Abnormal) 144/77    Wt Readings from Last 3 Encounters:  12/15/22 155 lb 8 oz (70.5 kg)  11/30/22 156 lb 6.4 oz (70.9 kg)  11/17/22 153 lb (69.4 kg)    Physical Exam Vitals and nursing note reviewed.  Constitutional:      Appearance: Normal appearance. She is well-developed.  HENT:     Head: Normocephalic and atraumatic.     Nose: Nose normal.     Mouth/Throat:     Mouth: Mucous membranes are moist.     Pharynx: Oropharynx is clear.  Eyes:     Extraocular Movements: Extraocular movements intact.     Conjunctiva/sclera: Conjunctivae normal.     Pupils: Pupils are equal, round, and reactive to light.  Neck:     Vascular: No carotid bruit.  Cardiovascular:     Rate and Rhythm: Normal rate and regular rhythm.     Pulses: Normal pulses.     Heart sounds: Normal heart sounds.  Pulmonary:     Effort: Pulmonary effort is normal.     Breath sounds: Normal breath sounds.  Abdominal:     Palpations: Abdomen is soft.  Musculoskeletal:        General: Normal range of motion.     Cervical back: Normal range of motion and neck supple.  Lymphadenopathy:     Cervical: No cervical adenopathy.  Skin:    General: Skin is warm and dry.     Capillary Refill: Capillary refill takes less than 2 seconds.  Neurological:     General: No focal deficit present.     Mental Status: She is alert and oriented to person, place, and time.  Psychiatric:        Mood and Affect: Mood normal.        Behavior: Behavior normal.        Thought Content: Thought content normal.        Judgment: Judgment normal.      Assessment & Plan    Primary hypertension Assessment & Plan: Stable. Continue  current medication.    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 Intolerance to statin.  Recommend she limit intake of fried and fatty foods. She should increase intake of lean proteins and green leafy vegetables. Adding exercise into daily routine will  also be beneficial.     Migraine aura, persistent, intractable Assessment & Plan: Continue regular visits  with neurology as scheduled.      Problem List Items Addressed This Visit       Cardiovascular and Mediastinum   Migraine aura, persistent, intractable    Continue regular visits  with neurology as scheduled.       Primary hypertension - Primary    Stable. Continue current medication.         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 Intolerance to statin.  Recommend she limit intake of fried and fatty foods. She should increase intake of lean proteins and green leafy vegetables. Adding exercise into daily routine will also be beneficial.          Return in about 8 years (around 11/30/2030) for medicare wellness, FBW at time of visit.         Ronnell Freshwater, NP  [facility] [phone] (phone) [phone] (fax)  [place]

## 2022-12-13 ENCOUNTER — Encounter: Payer: Medicare Other | Admitting: Physical Medicine & Rehabilitation

## 2022-12-15 ENCOUNTER — Encounter: Payer: Self-pay | Admitting: Registered Nurse

## 2022-12-15 ENCOUNTER — Encounter: Payer: Medicare Other | Attending: Physical Medicine & Rehabilitation | Admitting: Registered Nurse

## 2022-12-15 VITALS — BP 137/69 | HR 90 | Temp 100.8°F | Ht 65.0 in | Wt 155.5 lb

## 2022-12-15 DIAGNOSIS — M654 Radial styloid tenosynovitis [de Quervain]: Secondary | ICD-10-CM | POA: Insufficient documentation

## 2022-12-15 DIAGNOSIS — M4722 Other spondylosis with radiculopathy, cervical region: Secondary | ICD-10-CM

## 2022-12-15 DIAGNOSIS — M5416 Radiculopathy, lumbar region: Secondary | ICD-10-CM | POA: Insufficient documentation

## 2022-12-15 DIAGNOSIS — G894 Chronic pain syndrome: Secondary | ICD-10-CM | POA: Insufficient documentation

## 2022-12-15 DIAGNOSIS — M5412 Radiculopathy, cervical region: Secondary | ICD-10-CM | POA: Insufficient documentation

## 2022-12-15 DIAGNOSIS — M7062 Trochanteric bursitis, left hip: Secondary | ICD-10-CM | POA: Insufficient documentation

## 2022-12-15 DIAGNOSIS — M7061 Trochanteric bursitis, right hip: Secondary | ICD-10-CM | POA: Insufficient documentation

## 2022-12-15 DIAGNOSIS — G8929 Other chronic pain: Secondary | ICD-10-CM | POA: Insufficient documentation

## 2022-12-15 DIAGNOSIS — M542 Cervicalgia: Secondary | ICD-10-CM | POA: Insufficient documentation

## 2022-12-15 DIAGNOSIS — M47812 Spondylosis without myelopathy or radiculopathy, cervical region: Secondary | ICD-10-CM | POA: Insufficient documentation

## 2022-12-15 DIAGNOSIS — Z79891 Long term (current) use of opiate analgesic: Secondary | ICD-10-CM | POA: Insufficient documentation

## 2022-12-15 DIAGNOSIS — M546 Pain in thoracic spine: Secondary | ICD-10-CM | POA: Insufficient documentation

## 2022-12-15 DIAGNOSIS — Z5181 Encounter for therapeutic drug level monitoring: Secondary | ICD-10-CM | POA: Insufficient documentation

## 2022-12-15 DIAGNOSIS — M797 Fibromyalgia: Secondary | ICD-10-CM | POA: Insufficient documentation

## 2022-12-15 MED ORDER — HYDROCODONE-ACETAMINOPHEN 5-325 MG PO TABS: 1.0000 | ORAL_TABLET | Freq: Three times a day (TID) | ORAL | 0 refills | Status: DC | PRN

## 2022-12-15 MED ORDER — METHYLPREDNISOLONE 4 MG PO TBPK: ORAL_TABLET | ORAL | 0 refills | Status: DC

## 2022-12-15 NOTE — Progress Notes (Signed)
Subjective:    Patient ID: Patty Salas, female    DOB: 1956/06/03, 67 y.o.   MRN: [id number]  HPI: Patty Salas is a 67 y.o. female who is scheduled for Virtual Visit, she called office reporting she had a fever, chills and sore throat, her scheduled appointment was changed to a Virtual visit. Patty Salas and I have  discussed the limitations of evaluation and management by telemedicine and the availability of in person appointments. The patient expressed understanding and agreed to proceed. She will also F/U with her PCP regarding the above symptoms, she verbalizes understanding.   She states her  pain is located in her neck radiating into her bilateral shoulders, mid- lower back  pain radiating into her bilateral hips and bilateral lower extremities. She rates her pain 9. Her current exercise regime is walking and performing stretching exercises.  Patty Salas Morphine equivalent is 13.33 MME.She  is also prescribed  Lorazepam by Dr. Reece Levy .We have discussed the black box warning of using opioids and benzodiazepines. I highlighted the dangers of using these drugs together and discussed the adverse events including respiratory suppression, overdose, cognitive impairment and importance of compliance with current regimen. We will continue to monitor and adjust as indicated.  she is being closely monitored and under the care of her psychiatrist.    Last UDS was Performed 08/15/2022, it was consistent.     Pain Inventory Average Pain 9 Pain Right Now 9 My pain is constant, sharp, burning, stabbing, tingling, and aching  In the last 24 hours, has pain interfered with the following? General activity 9 Relation with others 9 Enjoyment of life 8 What TIME of day is your pain at its worst? morning , daytime, evening, and night Sleep (in general) Fair  Pain is worse with: some activites Pain improves with: medication Relief from Meds: 5  Family History  Problem Relation Age of Onset   Cancer  Mother        stomach   Migraines Mother    Arthritis Mother    Emphysema Mother    Heart disease Father    Hypertension Father    Cancer Father        colon   Dementia Father    Hypertension Brother    Migraines Brother    Hypertension Brother    Migraines Brother    Alcohol abuse Brother    Bipolar disorder Brother    Migraines Daughter    Arthritis Maternal Grandmother    Cancer Maternal Grandmother        stomach   Diabetes Maternal Grandmother    Stroke Maternal Grandmother    Cancer Maternal Aunt        lung   Emphysema Maternal Aunt    Cancer Paternal Uncle        colon   Hypertension Maternal Aunt    Heart disease Maternal Aunt    Cancer Paternal Uncle        lung   Social History   Socioeconomic History   Marital status: Married    Spouse name: Not on file   Number of children: 1   Years of education: COLLEGE1   Highest education level: Not on file  Occupational History   Occupation: HOUSEWIFE    Employer: UNEMPLOYED  Tobacco Use   Smoking status: Never   Smokeless tobacco: Never  Vaping Use   Vaping Use: Never used  Substance and Sexual Activity   Alcohol use: No   Drug use: No  Sexual activity: Not on file  Other Topics Concern   Not on file  Social History Narrative   Patient is right handed.   Patient drinks caffeine occasionally.   Social Determinants of Health   Financial Resource Strain: Not on file  Food Insecurity: No Food Insecurity (10/18/2022)   Hunger Vital Sign    Worried About Running Out of Food in the Last Year: Never true    Ran Out of Food in the Last Year: Never true  Transportation Needs: Not on file  Physical Activity: Not on file  Stress: Not on file  Social Connections: Not on file   Past Surgical History:  Procedure Laterality Date    kidney stones     ABDOMINAL ADHESION SURGERY     ABDOMINAL HYSTERECTOMY  1995   partial- hemmoraged after surgery-stitch came loose   APPENDECTOMY  1993   hemmoraged after  surgery- stitch came loose   CERVICAL DISC SURGERY  06/04/2001   with fusion   [place]   colonoscopy     COLONOSCOPY     CYSTOSCOPY     FOOT SURGERY     lt   [place] N/A 04/22/2021   Procedure: LAPAROSCOPIC INCISIONAL HERNIA REPAIR WITH MESH;  Surgeon: Clovis Riley, MD;  Location: [facility];  Service: General;  Laterality: N/A;   jj stent  07/2002   with ureteroscopy, cystoscopy and then removal of stent in office   LAPAROSCOPIC LYSIS OF ADHESIONS N/A 04/22/2021   Procedure: LYSIS OF ADHESIONS;  Surgeon: Clovis Riley, MD;  Location: [facility];  Service: General;  Laterality: N/A;   LITHOTRIPSY     LYMPH NODE BIOPSY Right 03/06/2014   Procedure: right groin LYMPH NODE BIOPSY;  Surgeon: Merrie Roof, MD;  Location: [facility];  Service: General;  Laterality: Right;   LYMPHADENECTOMY N/A 12/29/2015   Procedure: RETROPERITONEAL LYMPHADENECTOMY;  Surgeon: Alexis Frock, MD;  Location: [facility];  Service: Urology;  Laterality: N/A;   NECK SURGERY  2002   ROBOT ASSISTED LAPAROSCOPIC NEPHRECTOMY Left 12/29/2015   Procedure: XI ROBOTIC ASSISTED LEFT LAPAROSCOPIC NEPHRECTOMY;  Surgeon: Alexis Frock, MD;  Location: [facility];  Service: Urology;  Laterality: Left;   TONSILLECTOMY  1976   Past Surgical History:  Procedure Laterality Date    kidney stones     ABDOMINAL ADHESION SURGERY     ABDOMINAL HYSTERECTOMY  1995   partial- hemmoraged after surgery-stitch came loose   APPENDECTOMY  1993   hemmoraged after surgery- stitch came loose   CERVICAL DISC SURGERY  06/04/2001   with fusion   [place]   colonoscopy     COLONOSCOPY     CYSTOSCOPY     FOOT SURGERY     lt   INCISIONAL HERNIA REPAIR N/A 04/22/2021   Procedure: LAPAROSCOPIC INCISIONAL HERNIA REPAIR WITH MESH;  Surgeon: Clovis Riley, MD;  Location: [facility];  Service: General;  Laterality: N/A;   jj stent  07/2002   with ureteroscopy, cystoscopy and then removal of stent  in office   LAPAROSCOPIC LYSIS OF ADHESIONS N/A 04/22/2021   Procedure: LYSIS OF ADHESIONS;  Surgeon: Clovis Riley, MD;  Location: [facility];  Service: General;  Laterality: N/A;   LITHOTRIPSY     LYMPH NODE BIOPSY Right 03/06/2014   Procedure: right groin LYMPH NODE BIOPSY;  Surgeon: Merrie Roof, MD;  Location: [facility];  Service: General;  Laterality: Right;   LYMPHADENECTOMY N/A 12/29/2015  Procedure: RETROPERITONEAL LYMPHADENECTOMY;  Surgeon: Alexis Frock, MD;  Location: [facility];  Service: Urology;  Laterality: N/A;   NECK SURGERY  2002   ROBOT ASSISTED LAPAROSCOPIC NEPHRECTOMY Left 12/29/2015   Procedure: XI ROBOTIC ASSISTED LEFT LAPAROSCOPIC NEPHRECTOMY;  Surgeon: Alexis Frock, MD;  Location: [facility];  Service: Urology;  Laterality: Left;   TONSILLECTOMY  1976   Past Medical History:  Diagnosis Date   Anxiety    Arthritis    Asthma    Bipolar 2 disorder ([place])    pt stated, "I have Bipolar 2 and it is remission"   Bipolar affective ([place])    CAD (coronary artery disease), native artery transplanted heart    Nonobstructive by coronary CTA 08/2021 with less than 25% stenosis in the LAD, RCA and left circumflex.   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   Full dentures    Gout    Headache(784.0)    migraines   Herpesviral infection    Hypertension    IBS (irritable bowel syndrome)    with diarrhea   Lymphadenopathy    Myalgia and myositis, unspecified    PAT (paroxysmal atrial tachycardia)    asymptomatic 10 beat run on event monitor 09/2020   PFO (patent foramen ovale)    Small PFO noted on coronary CTA   Pneumonia    PONV (postoperative nausea and vomiting)    PTSD (post-traumatic stress disorder)    Renal calculi     Restless legs syndrome (RLS)    Thoracic radiculopathy    Vitamin D deficiency    Wears glasses    BP 137/69   Pulse 90   Temp (!) 100.8 F (38.2 C)   Ht 5\' 5"  (1.651 m)   Wt 155 lb 8 oz (70.5 kg)   BMI 25.88 kg/m   Opioid Risk Score:   Fall Risk Score:  `1  Depression screen PHQ 2/9     11/17/2022    1:08 PM 10/18/2022   10:32 AM 08/15/2022    1:04 PM 04/20/2022   11:36 AM 03/23/2022    2:16 PM 01/04/2022    9:20 AM 11/23/2021    1:05 PM  Depression screen PHQ 2/9  Decreased Interest 0 0 1 1 1  0 1  Down, Depressed, Hopeless 0 1 1 1 1  0 1  PHQ - 2 Score 0 1 2 2 2  0 2     Review of Systems  Constitutional:  Positive for chills and fever.  HENT:  Positive for ear pain and sore throat.   Eyes: Negative.   Respiratory: Negative.    Cardiovascular: Negative.   Gastrointestinal:  Positive for diarrhea.  Endocrine: Negative.   Genitourinary: Negative.   Musculoskeletal:  Positive for arthralgias, back pain and myalgias.  Skin: Negative.   Allergic/Immunologic: Negative.   Neurological:  Positive for dizziness and headaches.  Psychiatric/Behavioral:  Positive for dysphoric mood. The patient is nervous/anxious.   All other systems reviewed and are negative.      Objective:   Physical Exam Vitals and nursing note reviewed.  Musculoskeletal:     Comments: No Physical Exam: Virtual visit         Assessment & Plan:  1 History of fibromyalgia  with myofascial pain and multiple trigger points.Continue with Heat and exercise Regime. Continue with current medication regimen with  gabapentin. 12/15/2022 2 Chronic migraine headaches.Continue to Monitor. S/P Botox.on 05/30/2017. 12/15/2022 3. Midline Low Back Pain/ Lumbar Spondylosis/Lumbar degenerative disk disease, L4-5/ Lumbar Radiculitis: Continue with HEP and current medication regimen. Continue Hydrocodone 5/325 mg one tablet every 8 hours as needed #80. Marland KitchenContinue with slow weaning of Hydrocodone. Second script sent for  the following month to accommodate scheduled appointment . 12/15/2022 4. 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 Urology Following. 12/15/2022. 5. 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 12/15/2022 6. Cervicalgia/ Cervical RadiculitisCervical Spondylosis:  Continue current medication regiment with  Gabapentin. Continue  with HEP and Continue to monitor. 12/15/2022 7. Muscle Spasm: Myofascial Pain: She is svcheduled for Trigger Point Injection with Dr Naaman Plummer : Continue current medication regimen with Flexeril as needed. 12/15/2022 8. Bilateral Greater Trochanter Bursitis: Continue to alternate with ice and heat therapy. Continue HEP as Tolerated. Continue to monitor. 12/15/2022 9. Bilateral Knee Pain: S/P Knee Injection by Dr Sharol Given on 02/23/2020. Orthopedics Following. Continue to Monitor. 12/15/2022  10. Left Groin Pain: No complaints today.Patty Salas reports Oncology Following. Continue to Monitor. 12/15/2022.  20. Fall at Home: No Falls this month. Educated on falls prevention, she verbalizes understanding. Continue to Monitor. 12/15/2022  12. Alfonse Ras Tenosynovitis: RX: Medrol Dose Pak.  Continue to Monitor. 12/15/2022   F/U in 1 month  Virtual Visit Established Patient Location of Patient: In her Home Location of Provider: In the Office

## 2022-12-24 DIAGNOSIS — G720 Drug-induced myopathy: Secondary | ICD-10-CM | POA: Insufficient documentation

## 2022-12-24 NOTE — Assessment & Plan Note (Signed)
Continue regular visits  with neurology as scheduled.

## 2022-12-24 NOTE — Assessment & Plan Note (Signed)
Stable.  Continue current medication

## 2022-12-24 NOTE — Assessment & Plan Note (Signed)
Lipid panel with mild/moderate elevation. Intolerance to statin.  Recommend she limit intake of fried and fatty foods. She should increase intake of lean proteins and green leafy vegetables. Adding exercise into daily routine will also be beneficial.

## 2022-12-25 ENCOUNTER — Telehealth: Payer: Self-pay | Admitting: Neurology

## 2022-12-25 ENCOUNTER — Telehealth: Payer: Self-pay | Admitting: Cardiology

## 2022-12-25 MED ORDER — PROMETHAZINE HCL 25 MG PO TABS: ORAL_TABLET | ORAL | 0 refills | Status: DC

## 2022-12-25 NOTE — Telephone Encounter (Signed)
*  STAT* If patient is at the pharmacy, call can be transferred to refill team.   1. Which medications need to be refilled? (please list name of each medication and dose if known)   furosemide (LASIX) 20 MG tablet   2. Which pharmacy/location (including street and city if local pharmacy) is medication to be sent to?  [id number] - [place], [place].   3. Do they need a 30 day or 90 day supply?   90 day  Patient stated she has some medication left.  Patient has appointment on 4/17.

## 2022-12-25 NOTE — Telephone Encounter (Signed)
Pt called. Requesting a refill on  promethazine (PHENERGAN) 25 MG tablet. Should be sent to [facility] [phone]

## 2022-12-25 NOTE — Telephone Encounter (Signed)
Patient has enough lasix to last her until her visit on 01/17/23. Per Dr. Radford Pax she needs an appointment before any more refills.

## 2022-12-25 NOTE — Telephone Encounter (Signed)
Called and spoke to pt and stated that we would send refill for promethazine

## 2023-01-04 NOTE — Progress Notes (Deleted)
Cardiology Office Note:    Date:  01/04/2023   ID:  Patty Salas, DOB 1956/02/01, MRN [id number]  PCP:  Ronnell Freshwater, NP  [place] Providers Cardiologist:  None { Click to update primary MD,subspecialty MD or APP then REFRESH:1}  *** Referring MD: Ronnell Freshwater, NP   Chief Complaint:  No chief complaint on file. {Click here for Visit Info    :1}    History of Present Illness:   Patty Salas is a 67 y.o. female with   a hx of anxiety, asthma, fibromyalgia, HTN who was referred for evaluation of LE edema.  She has a hx of renal cell CA and is s/p nephrectomy.  She recently had problems with renal stones. She has a hx of IBS and diarrhea as well.  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     She has chronic DOE related to asthma and had been on albuterol but had not been taking it at her last OV.    2D echo showed normal heart function with EF 60-65% with G1DD.  Coronary CTA showed coronary Ca score of 117 with minimal CAD < 1-24% in the LAD/LCx and RCA and a small PFO.     Patient last had a video visit with Dr. Radford Pax 10/11/21 and trouble with LE edema thought possibly due to pelvic lymphoma and to follow with oncology.  Also referred to lipid clinic.      Past Medical History:  Diagnosis Date   Anxiety    Arthritis    Asthma    Bipolar 2 disorder ([place])    pt stated, "I have Bipolar 2 and it is remission"   Bipolar affective ([place])    CAD (coronary artery disease), native artery transplanted heart    Nonobstructive by coronary CTA 08/2021 with less than 25% stenosis in the LAD, RCA and left circumflex.   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   Full dentures    Gout    Headache(784.0)     migraines   Herpesviral infection    Hypertension    IBS (irritable bowel syndrome)    with diarrhea   Lymphadenopathy    Myalgia and myositis, unspecified    PAT (paroxysmal atrial tachycardia)    asymptomatic 10 beat run on event monitor 09/2020   PFO (patent foramen ovale)    Small PFO noted on coronary CTA   Pneumonia    PONV (postoperative nausea and vomiting)    PTSD (post-traumatic stress disorder)    Renal calculi    Restless legs syndrome (RLS)    Thoracic radiculopathy    Vitamin D deficiency    Wears glasses    Current Medications: No outpatient medications have been marked as taking for the 01/17/23 encounter (Appointment) with Imogene Burn, PA-C.    Allergies:   Aripiprazole, Aspirin, Bromfenac sodium, Chlorpromazine, Cymbalta [duloxetine hcl], Divalproex sodium, Duloxetine, Duract [bromfenac], Flurazepam, Hydrochlorothiazide, Imitrex [sumatriptan succinate], Latex, Mellaril, Metoclopramide, Olanzapine, Olanzapine, Other, Penicillins, Quetiapine, Statins, Sumatriptan, Thioridazine, Topamax, Topiramate, Trifluoperazine, Aripiprazole, Dalmane [flurazepam hcl], Darifenacin hydrobromide er, Metoclopramide hcl, Seroquel [quetiapine fumerate], Stelazine, Thorazine [chlorpromazine hcl], Allopurinol, Aspirin effervescent, Barium sulfate, Benzocaine, Biofreeze [menthol (topical analgesic)],  Camphor-menthol, Capsaicin, Capsaicin-menthol, Clindamycin, Clindamycin/lincomycin, Clove oil, Colchicine, Cycloheximide, Darifenacin, Diatrizoate, Diflucan [fluconazole], Erythromycin, Garlic, Iohexol, Ivp dye [iodinated contrast media], Metronidazole, Mirabegron, Miralax [polyethylene glycol], Moxifloxacin, Nsaids, Oxybutynin, Polyethylene glycol 3350, Potassium chloride, Potassium-containing compounds, Prednisone, Psyllium, Red dye, Seroquel [quetiapine fumarate], Simvastatin, Urocit - k [potassium citrate], Avelox [moxifloxacin hcl in nacl], Barium-containing compounds,  Butalbital-aspirin-caffeine, Diclofenac, Doxycycline, E-mycin [erythromycin base], Flagyl [metronidazole hcl], Lamotrigine, Pregabalin, Pregabalin, Risperidone, Risperidone, and Toradol [ketorolac tromethamine]   Social History   Tobacco Use   Smoking status: Never   Smokeless tobacco: Never  Vaping Use   Vaping Use: Never used  Substance Use Topics   Alcohol use: No   Drug use: No    Family Hx: The patient's family history includes Alcohol abuse in her brother; Arthritis in her maternal grandmother and mother; Bipolar disorder in her brother; Cancer in her father, maternal aunt, maternal grandmother, mother, paternal uncle, and paternal uncle; Dementia in her father; Diabetes in her maternal grandmother; Emphysema in her maternal aunt and mother; Heart disease in her father and maternal aunt; Hypertension in her brother, brother, father, and maternal aunt; Migraines in her brother, brother, daughter, and mother; Stroke in her maternal grandmother.  ROS     Physical Exam:    VS:  There were no vitals taken for this visit.    Wt Readings from Last 3 Encounters:  12/15/22 155 lb 8 oz (70.5 kg)  11/30/22 156 lb 6.4 oz (70.9 kg)  11/17/22 153 lb (69.4 kg)    Physical Exam  GEN: Well nourished, well developed, in no acute distress  HEENT: normal  Neck: no JVD, carotid bruits, or masses Cardiac:RRR; no murmurs, rubs, or gallops  Respiratory:  clear to auscultation bilaterally, normal work of breathing GI: soft, nontender, nondistended, + BS Ext: without cyanosis, clubbing, or edema, Good distal pulses bilaterally MS: no deformity or atrophy  Skin: warm and dry, no rash Neuro:  Alert and Oriented x 3, Strength and sensation are intact Psych: euthymic mood, full affect        EKGs/Labs/Other Test Reviewed:    EKG:  EKG is *** ordered today.  The ekg ordered today demonstrates ***  Recent Labs: 03/30/2022: ALT 14; BUN 11; Creatinine, Ser 0.88; Hemoglobin 12.2; Platelets 282;  Potassium 3.6; Sodium 140   Recent Lipid Panel No results for input(s): "CHOL", "TRIG", "HDL", "VLDL", "LDLCALC", "LDLDIRECT" in the last 8760 hours.   Prior CV Studies: ECHO COMPLETE WITH IMAGING ENHANCING AGENT 10/07/2020  Narrative ECHOCARDIOGRAM REPORT    Patient Name:   Patty Salas [facility] Date of Exam: 10/07/2020 Medical Rec #:  [id number]      Height:       66.5 in Accession #:    [id number]     Weight:       156.2 lb Date of Birth:  05/08/1956      BSA:          1.810 m Patient Age:    65 years       BP:           129/68 mmHg Patient Gender: F              HR:           103 bpm. Exam Location:  [place]  Procedure: 2D Echo, Cardiac Doppler, Color Doppler and Intracardiac Opacification Agent  MODIFIED REPORT: This report was modified by Cherlynn Kaiser MD on 10/07/2020 due to report upload error. Indications:     R60.0 Lower extremtiy edema.  History:         Patient has no prior history of Echocardiogram examinations.  Sonographer:     Cresenciano Lick RDCS Referring Phys:  [place] Diagnosing Phys: Cherlynn Kaiser MD  IMPRESSIONS   1. Left ventricular ejection fraction, by estimation, is 60 to 65%. The left ventricle has normal function. The left ventricle has no regional wall motion abnormalities. Left ventricular diastolic parameters are consistent with Grade I diastolic dysfunction (impaired relaxation). 2. Right ventricular systolic function is normal. The right ventricular size is normal. There is normal pulmonary artery systolic pressure. The estimated right ventricular systolic pressure is 123XX123 mmHg. 3. The mitral valve is normal in structure. No evidence of mitral valve regurgitation. No evidence of mitral stenosis. 4. The aortic valve is tricuspid. There is mild calcification of the aortic valve. Aortic valve regurgitation is not visualized. No aortic stenosis is present. 5. The inferior vena cava is normal in size with greater than 50%  respiratory variability, suggesting right atrial pressure of 3 mmHg.  FINDINGS Left Ventricle: Left ventricular ejection fraction, by estimation, is 60 to 65%. The left ventricle has normal function. The left ventricle has no regional wall motion abnormalities. Definity contrast agent was given IV to delineate the left ventricular endocardial borders. The left ventricular internal cavity size was normal in size. There is no left ventricular hypertrophy. Left ventricular diastolic parameters are consistent with Grade I diastolic dysfunction (impaired relaxation).  Right Ventricle: The right ventricular size is normal. No increase in right ventricular wall thickness. Right ventricular systolic function is normal. There is normal pulmonary artery systolic pressure. The tricuspid regurgitant velocity is 2.17 m/s, and with an assumed right atrial pressure of 3 mmHg, the estimated right ventricular systolic pressure is 123XX123 mmHg.  Left Atrium: Left atrial size was normal in size.  Right Atrium: Right atrial size was normal in size.  Pericardium: There is no evidence of pericardial effusion.  Mitral Valve: The mitral valve is normal in structure. No evidence of mitral valve regurgitation. No evidence of mitral valve stenosis.  Tricuspid Valve: The tricuspid valve is normal in structure. Tricuspid valve regurgitation is trivial. No evidence of tricuspid stenosis.  Aortic Valve: The aortic valve is tricuspid. There is mild calcification of the aortic valve. Aortic valve regurgitation is not visualized. No aortic stenosis is present.  Pulmonic Valve: The pulmonic valve was normal in structure. Pulmonic valve regurgitation is trivial. No evidence of pulmonic stenosis.  Aorta: The aortic root is normal in size and structure.  Venous: The inferior vena cava is normal in size with greater than 50% respiratory variability, suggesting right atrial pressure of 3 mmHg.  IAS/Shunts: There is redundancy of  the interatrial septum. No atrial level shunt detected by color flow Doppler.   LEFT VENTRICLE PLAX 2D LVIDd:         3.60 cm  Diastology LVIDs:         2.50 cm  LV e' medial:    7.07 cm/s LV PW:         1.00 cm  LV E/e' medial:  14.3 LV IVS:        0.80 cm  LV e' lateral:   6.64 cm/s LVOT diam:     1.80 cm  LV E/e' lateral: 15.2 LV SV:         70 LV SV Index:   39 LVOT Area:     2.54 cm   RIGHT VENTRICLE  IVC RV Basal diam:  3.30 cm     IVC diam: 1.20 cm RV S prime:     14.50 cm/s TAPSE (M-mode): 2.4 cm  LEFT ATRIUM             Index       RIGHT ATRIUM          Index LA diam:        3.60 cm 1.99 cm/m  RA Area:     9.21 cm LA Vol (A2C):   24.5 ml 13.53 ml/m RA Volume:   20.40 ml 11.27 ml/m LA Vol (A4C):   21.1 ml 11.65 ml/m LA Biplane Vol: 22.8 ml 12.59 ml/m AORTIC VALVE LVOT Vmax:   125.50 cm/s LVOT Vmean:  90.200 cm/s LVOT VTI:    0.275 m  AORTA Ao Root diam: 3.10 cm Ao Asc diam:  3.10 cm  MITRAL VALVE                TRICUSPID VALVE MV Area (PHT): 2.99 cm     TR Peak grad:   18.8 mmHg MV Decel Time: 254 msec     TR Vmax:        217.00 cm/s MV E velocity: 101.00 cm/s MV A velocity: 120.00 cm/s  SHUNTS MV E/A ratio:  0.84         Systemic VTI:  0.28 m Systemic Diam: 1.80 cm  Cherlynn Kaiser MD Electronically signed by Cherlynn Kaiser MD Signature Date/Time: 10/07/2020/3:31:12 PM    Final (Updated)   CT CORONARY MORPH W/CTA COR W/SCORE W/CA W/CM &/OR WO/CM 08/16/2021  Addendum 08/16/2021  2:58 PM ADDENDUM REPORT: 08/16/2021 14:55  CLINICAL DATA:  Chest pain  EXAM: Cardiac/Coronary CTA  TECHNIQUE: A non-contrast, gated CT scan was obtained with axial slices of 3 mm through the heart for calcium scoring. Calcium scoring was performed using the Agatston method. A 100 kV prospective, gated, contrast cardiac scan was obtained. Gantry rotation speed was 250 msecs and collimation was 0.6 mm. Two sublingual nitroglycerin tablets (0.8 mg) were  given. The 3D data set was reconstructed in 5% intervals of the 35-75% of the R-R cycle. Diastolic phases were analyzed on a dedicated workstation using MPR, MIP, and VRT modes. The patient received 95 cc of contrast.  FINDINGS: Image quality: Excellent.  Noise artifact is: Limited.  Coronary Arteries:  Normal coronary origin.  Right dominance.  Left main: The left main is a large caliber vessel with a normal take off from the left coronary cusp that bifurcates to form a left anterior descending artery and a left circumflex artery. There is no plaque or stenosis.  Left anterior descending artery: The proximal LAD contains minimal calcified plaque (<25%). The mid and distal segments are patent. The LAD gives off 1 large diagonal branch with minimal non-calcified plaque (<25%).  Left circumflex artery: The LCX is non-dominant. There is minimal calcified plaque (<25%) in the mid segment. OM1 contains minimal calcified plaque (<25%). OM2 is patent.  Right coronary artery: The RCA is dominant with normal take off from the right coronary cusp. There is minimal calcified plaque (<25%) in the proximal segment. The mid and distal segments are patent. The RCA terminates as a PDA and right posterolateral branch without evidence of plaque or stenosis.  Right Atrium: Right atrial size is within normal limits.  Right Ventricle: The right ventricular cavity is within normal limits.  Left Atrium: Left atrial size is normal in size with no left atrial appendage filling defect. A small PFO  is present.  Left Ventricle: The ventricular cavity size is within normal limits. There are no stigmata of prior infarction. There is no abnormal filling defect.  Pulmonary arteries: Normal in size without proximal filling defect.  Pulmonary veins: Normal pulmonary venous drainage.  Pericardium: Normal thickness with no significant effusion or calcium present.  Cardiac valves: The aortic valve is  trileaflet without significant calcification. The mitral valve is normal structure without significant calcification.  Aorta: Normal caliber with no significant disease.  Extra-cardiac findings: See attached radiology report for non-cardiac structures.  IMPRESSION: 1. Coronary calcium score of 117. This was 81st percentile for age-, sex, and race-matched controls.  2. Normal coronary origin with right dominance.  3. Minimal CAD (<25%) in the LAD/LCX/RCA.  4. Small PFO.  RECOMMENDATIONS: 1. Minimal non-obstructive CAD (0-24%). Consider non-atherosclerotic causes of chest pain. Consider preventive therapy and risk factor modification.  Eleonore Chiquito, MD   Electronically Signed By: Eleonore Chiquito M.D. On: 08/16/2021 14:55  Narrative EXAM: OVER-READ INTERPRETATION  CT CHEST  The following report is an over-read performed by radiologist Dr. Markus Daft of [facility], [place] on 08/16/2021. This over-read does not include interpretation of cardiac or coronary anatomy or pathology. The coronary calcium score/coronary CTA interpretation by the cardiologist is attached.  COMPARISON:  06/04/2021 and PET-CT 03/06/2016  FINDINGS: Vascular: Normal caliber of the visualized thoracic aorta. Main pulmonary arteries are patent.  Mediastinum/Nodes: Visualized mediastinal structures are normal.  Lungs/Pleura: Small density in the right middle lobe on series 11, image 24 is unchanged since 2017. Otherwise, the visualized lower lungs are clear. No pleural effusions.  Upper Abdomen: Images of the upper abdomen are unremarkable.  Musculoskeletal: No acute abnormality.  IMPRESSION: Negative over-read examination. No acute abnormality involving the extracardiac structures.  Electronically Signed: By: Markus Daft M.D. On: 08/16/2021 11:42        Risk Assessment/Calculations/Metrics:   {Does this patient have ATRIAL FIBRILLATION?:[phone]}     No BP recorded.  {Refresh  Note OR Click here to enter BP  :1}***    ASSESSMENT & PLAN:   No problem-specific Assessment & Plan notes found for this encounter.   HTN -BP is adequately controlled on exam today -diet controlled    LE edema -? Whether this is related to her pelvic lymphoma -2D echocardiogram showed normal LV function with grade 1 diastolic dysfunction -I encouraged her to follow less than 2 g sodium diet to prevent lower extremity edema -continue lasix as needed -continue to follow with Oncology     Sinus tachycardia/PAT -HR remains mildly elevated again at 102 bpm today but denies any palpitations -likely related to chronic pain and deconditioning -She wore a Zio patch in September 20, 2020 for elevated heart rate and average heart rate was 89 bpm    ASCAD -coronary Ca score of 117 with minimal CAD < 1-24% in the LAD/LCx and RCA -She denies any anginal symptoms -Her last LDL was 155 and she was referred to our lipid clinic with Pharm.D but this never occurred so I will place another referral>> she is statin intolerant -She gets hives with aspirin    HLD -LDL goal is less than 70 -Her last LDL was 155 -We will set up for Pharm.D. in lipid clinic as she is statin intolerant -She would benefit from addition of a PCSK9 inhibitor          {Are you ordering a CV Procedure (e.g. stress test, cath, DCCV, TEE, etc)?   Press F2        :  [id number]   Dispo:  No follow-ups on file.   Medication Adjustments/Labs and Tests Ordered: Current medicines are reviewed at length with the patient today.  Concerns regarding medicines are outlined above.  Tests Ordered: No orders of the defined types were placed in this encounter.  Medication Changes: No orders of the defined types were placed in this encounter.  Signed, Ermalinda Barrios, PA-C  01/04/2023 7:16 AM    [facility] [address] [place], [place], [place]  [address] Phone: [phone]; Fax: [phone]

## 2023-01-09 ENCOUNTER — Other Ambulatory Visit: Payer: Self-pay | Admitting: Cardiology

## 2023-01-10 ENCOUNTER — Encounter: Payer: Medicare Other | Admitting: Physical Medicine & Rehabilitation

## 2023-01-15 ENCOUNTER — Encounter: Payer: Medicare Other | Admitting: Registered Nurse

## 2023-01-15 MED ORDER — PROMETHAZINE HCL 25 MG PO TABS: ORAL_TABLET | ORAL | 0 refills | Status: DC

## 2023-01-15 NOTE — Telephone Encounter (Signed)
Refill sent. Routing to the phone room to make the pt aware.

## 2023-01-15 NOTE — Addendum Note (Signed)
Addended by: Eather Colas E on: 01/15/2023 11:12 AM   Modules accepted: Orders

## 2023-01-15 NOTE — Telephone Encounter (Signed)
Pt states she is down to 2 1/2 of the  promethazine (PHENERGAN) 25 MG tablet, she is asking if Maralyn Sago, NP will either call in the 15 tablets or if she will call in enough to get pt to the upcoming appointment on 04-25

## 2023-01-16 ENCOUNTER — Telehealth: Payer: Self-pay | Admitting: Neurology

## 2023-01-16 ENCOUNTER — Other Ambulatory Visit: Payer: Self-pay | Admitting: Neurology

## 2023-01-16 ENCOUNTER — Encounter: Payer: Medicare Other | Admitting: Registered Nurse

## 2023-01-16 NOTE — Telephone Encounter (Signed)
I stated that a refill for almotriptan has been sent and the pt voiced understanding.

## 2023-01-16 NOTE — Telephone Encounter (Signed)
Pt called, stated she wants to let Maralyn Sago know that she isn't trying to change her medication. [facility] requested another medication because they are out of stock on Sumatriptan.

## 2023-01-16 NOTE — Telephone Encounter (Signed)
Requested Prescriptions   Pending Prescriptions Disp Refills   almotriptan (AXERT) 12.5 MG tablet [Pharmacy Med Name: ALMOTRIPTAN MALATE 12.5 MG TAB] 24 tablet 1    Sig: TAKE 1 TABLET BY MOUTH AS NEEDED FOR MIGRAINE (MAX 2 TABS PER WEEK).    The medication requested is listed as an allergy. Routing to provider to review

## 2023-01-17 ENCOUNTER — Ambulatory Visit: Payer: Medicare Other | Admitting: Physician Assistant

## 2023-01-18 NOTE — Progress Notes (Deleted)
Subjective:    Patient ID: Patty Salas, female    DOB: 1955/12/18, 67 y.o.   MRN: [id number]  HPI   Pain Inventory Average Pain {NUMBERS; 0-10:5044} Pain Right Now {NUMBERS; 0-10:5044} My pain is {PAIN DESCRIPTION:21022940}  In the last 24 hours, has pain interfered with the following? General activity {NUMBERS; 0-10:5044} Relation with others {NUMBERS; 0-10:5044} Enjoyment of life {NUMBERS; 0-10:5044} What TIME of day is your pain at its worst? {time of day:24191} Sleep (in general) {BHH GOOD/FAIR/POOR:22877}  Pain is worse with: {ACTIVITIES:21022942} Pain improves with: {PAIN IMPROVES WUJW:11914782} Relief from Meds: {NUMBERS; 0-10:5044}  Family History  Problem Relation Age of Onset   Cancer Mother        stomach   Migraines Mother    Arthritis Mother    Emphysema Mother    Heart disease Father    Hypertension Father    Cancer Father        colon   Dementia Father    Hypertension Brother    Migraines Brother    Hypertension Brother    Migraines Brother    Alcohol abuse Brother    Bipolar disorder Brother    Migraines Daughter    Arthritis Maternal Grandmother    Cancer Maternal Grandmother        stomach   Diabetes Maternal Grandmother    Stroke Maternal Grandmother    Cancer Maternal Aunt        lung   Emphysema Maternal Aunt    Cancer Paternal Uncle        colon   Hypertension Maternal Aunt    Heart disease Maternal Aunt    Cancer Paternal Uncle        lung   Social History   Socioeconomic History   Marital status: Married    Spouse name: Not on file   Number of children: 1   Years of education: COLLEGE1   Highest education level: Not on file  Occupational History   Occupation: HOUSEWIFE    Employer: UNEMPLOYED  Tobacco Use   Smoking status: Never   Smokeless tobacco: Never  Vaping Use   Vaping Use: Never used  Substance and Sexual Activity   Alcohol use: No   Drug use: No   Sexual activity: Not on file  Other Topics Concern    Not on file  Social History Narrative   Patient is right handed.   Patient drinks caffeine occasionally.   Social Determinants of Health   Financial Resource Strain: Not on file  Food Insecurity: No Food Insecurity (10/18/2022)   Hunger Vital Sign    Worried About Running Out of Food in the Last Year: Never true    Ran Out of Food in the Last Year: Never true  Transportation Needs: Not on file  Physical Activity: Not on file  Stress: Not on file  Social Connections: Not on file   Past Surgical History:  Procedure Laterality Date    kidney stones     ABDOMINAL ADHESION SURGERY     ABDOMINAL HYSTERECTOMY  1995   partial- hemmoraged after surgery-stitch came loose   APPENDECTOMY  1993   hemmoraged after surgery- stitch came loose   CERVICAL DISC SURGERY  06/04/2001   with fusion   CHOLECYSTECTOMY  1985   colonoscopy     COLONOSCOPY     CYSTOSCOPY     FOOT SURGERY     lt   INCISIONAL HERNIA REPAIR N/A 04/22/2021   Procedure: LAPAROSCOPIC INCISIONAL HERNIA REPAIR WITH MESH;  Surgeon: Berna Bue, MD;  Location: [facility];  Service: General;  Laterality: N/A;   jj stent  07/2002   with ureteroscopy, cystoscopy and then removal of stent in office   LAPAROSCOPIC LYSIS OF ADHESIONS N/A 04/22/2021   Procedure: LYSIS OF ADHESIONS;  Surgeon: Berna Bue, MD;  Location: [facility];  Service: General;  Laterality: N/A;   LITHOTRIPSY     LYMPH NODE BIOPSY Right 03/06/2014   Procedure: right groin LYMPH NODE BIOPSY;  Surgeon: Robyne Askew, MD;  Location: ;  Service: General;  Laterality: Right;   LYMPHADENECTOMY N/A 12/29/2015   Procedure: RETROPERITONEAL LYMPHADENECTOMY;  Surgeon: Sebastian Ache, MD;  Location: [facility];  Service: Urology;  Laterality: N/A;   NECK SURGERY  2002   ROBOT ASSISTED LAPAROSCOPIC NEPHRECTOMY Left 12/29/2015   Procedure: XI ROBOTIC ASSISTED LEFT LAPAROSCOPIC NEPHRECTOMY;  Surgeon: Sebastian Ache, MD;  Location: [facility];  Service:  Urology;  Laterality: Left;   TONSILLECTOMY  1976   Past Surgical History:  Procedure Laterality Date    kidney stones     ABDOMINAL ADHESION SURGERY     ABDOMINAL HYSTERECTOMY  1995   partial- hemmoraged after surgery-stitch came loose   APPENDECTOMY  1993   hemmoraged after surgery- stitch came loose   CERVICAL DISC SURGERY  06/04/2001   with fusion   CHOLECYSTECTOMY  1985   colonoscopy     COLONOSCOPY     CYSTOSCOPY     FOOT SURGERY     lt   INCISIONAL HERNIA REPAIR N/A 04/22/2021   Procedure: LAPAROSCOPIC INCISIONAL HERNIA REPAIR WITH MESH;  Surgeon: Berna Bue, MD;  Location: [facility];  Service: General;  Laterality: N/A;   jj stent  07/2002   with ureteroscopy, cystoscopy and then removal of stent in office   LAPAROSCOPIC LYSIS OF ADHESIONS N/A 04/22/2021   Procedure: LYSIS OF ADHESIONS;  Surgeon: Berna Bue, MD;  Location: [facility];  Service: General;  Laterality: N/A;   LITHOTRIPSY     LYMPH NODE BIOPSY Right 03/06/2014   Procedure: right groin LYMPH NODE BIOPSY;  Surgeon: Robyne Askew, MD;  Location: [facility];  Service: General;  Laterality: Right;   LYMPHADENECTOMY N/A 12/29/2015   Procedure: RETROPERITONEAL LYMPHADENECTOMY;  Surgeon: Sebastian Ache, MD;  Location: [facility];  Service: Urology;  Laterality: N/A;   NECK SURGERY  2002   ROBOT ASSISTED LAPAROSCOPIC NEPHRECTOMY Left 12/29/2015   Procedure: XI ROBOTIC ASSISTED LEFT LAPAROSCOPIC NEPHRECTOMY;  Surgeon: Sebastian Ache, MD;  Location: [facility];  Service: Urology;  Laterality: Left;   TONSILLECTOMY  1976   Past Medical History:  Diagnosis Date   Anxiety    Arthritis    Asthma    Bipolar 2 disorder (HCC)    pt stated, "I have Bipolar 2 and it is remission"   Bipolar affective (HCC)    CAD (coronary artery disease), native artery transplanted heart    Nonobstructive by coronary CTA 08/2021 with less than 25% stenosis in the LAD, RCA and left circumflex.   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   Full dentures    Gout    Headache(784.0)    migraines  Herpesviral infection    Hypertension    IBS (irritable bowel syndrome)    with diarrhea   Lymphadenopathy    Myalgia and myositis, unspecified    PAT (paroxysmal atrial tachycardia)    asymptomatic 10 beat run on event monitor 09/2020   PFO (patent foramen ovale)    Small PFO noted on coronary CTA   Pneumonia    PONV (postoperative nausea and vomiting)    PTSD (post-traumatic stress disorder)    Renal calculi    Restless legs syndrome (RLS)    Thoracic radiculopathy    Vitamin D deficiency    Wears glasses    There were no vitals taken for this visit.  Opioid Risk Score:   Fall Risk Score:  `1  Depression screen PHQ 2/9     11/17/2022    1:08 PM 10/18/2022   10:32 AM 08/15/2022    1:04 PM 04/20/2022   11:36 AM 03/23/2022    2:16 PM 01/04/2022    9:20 AM 11/23/2021    1:05 PM  Depression screen PHQ 2/9  Decreased Interest 0 0 1 1 1  0 1  Down, Depressed, Hopeless 0 1 1 1 1  0 1  PHQ - 2 Score 0 1 2 2 2  0 2    Review of Systems     Objective:   Physical Exam        Assessment & Plan:

## 2023-01-19 ENCOUNTER — Other Ambulatory Visit: Payer: Self-pay | Admitting: *Deleted

## 2023-01-19 ENCOUNTER — Encounter: Payer: Medicare Other | Admitting: Registered Nurse

## 2023-01-19 DIAGNOSIS — M797 Fibromyalgia: Secondary | ICD-10-CM

## 2023-01-19 DIAGNOSIS — G894 Chronic pain syndrome: Secondary | ICD-10-CM

## 2023-01-19 MED ORDER — HYDROCODONE-ACETAMINOPHEN 5-325 MG PO TABS
1.0000 | ORAL_TABLET | Freq: Three times a day (TID) | ORAL | 0 refills | Status: DC | PRN
Start: 2023-01-19 — End: 2023-01-31

## 2023-01-19 NOTE — Telephone Encounter (Signed)
PMP was Reviewed.  Hydrocodone e-scribed today.  April called Ms. Nadel regarding the above.

## 2023-01-19 NOTE — Telephone Encounter (Signed)
Patient had to cx appt for today but has rescheduled for 4/23. She has diarrhea which had resolved but came back. She is requesting refill on Norco as she has 1 tab left.

## 2023-01-23 ENCOUNTER — Ambulatory Visit: Payer: Medicare Other | Admitting: Registered Nurse

## 2023-01-24 ENCOUNTER — Ambulatory Visit: Payer: Medicare Other | Admitting: Physical Medicine & Rehabilitation

## 2023-01-25 ENCOUNTER — Ambulatory Visit: Payer: Medicare Other | Admitting: Neurology

## 2023-01-25 ENCOUNTER — Telehealth: Payer: Self-pay

## 2023-01-25 NOTE — Telephone Encounter (Signed)
Patient called office to inform that she would like to get a referral to [facility] for her IVS, Please advise, thanks.

## 2023-01-26 NOTE — Telephone Encounter (Signed)
Provided is advised

## 2023-01-31 ENCOUNTER — Encounter: Payer: Self-pay | Admitting: Physical Medicine & Rehabilitation

## 2023-01-31 ENCOUNTER — Encounter: Payer: Medicare Other | Attending: Physical Medicine & Rehabilitation | Admitting: Physical Medicine & Rehabilitation

## 2023-01-31 VITALS — BP 150/90 | HR 100 | Temp 99.1°F | Ht 65.0 in | Wt 150.0 lb

## 2023-01-31 DIAGNOSIS — G894 Chronic pain syndrome: Secondary | ICD-10-CM | POA: Insufficient documentation

## 2023-01-31 DIAGNOSIS — M797 Fibromyalgia: Secondary | ICD-10-CM

## 2023-01-31 DIAGNOSIS — M7712 Lateral epicondylitis, left elbow: Secondary | ICD-10-CM | POA: Insufficient documentation

## 2023-01-31 DIAGNOSIS — K648 Other hemorrhoids: Secondary | ICD-10-CM | POA: Insufficient documentation

## 2023-01-31 DIAGNOSIS — M47812 Spondylosis without myelopathy or radiculopathy, cervical region: Secondary | ICD-10-CM | POA: Insufficient documentation

## 2023-01-31 MED ORDER — HYDROCODONE-ACETAMINOPHEN 5-325 MG PO TABS
1.0000 | ORAL_TABLET | Freq: Three times a day (TID) | ORAL | 0 refills | Status: DC | PRN
Start: 2023-01-31 — End: 2023-03-14

## 2023-01-31 MED ORDER — CYCLOBENZAPRINE HCL 10 MG PO TABS: 10.0000 mg | ORAL_TABLET | Freq: Three times a day (TID) | ORAL | 4 refills | Status: DC

## 2023-01-31 NOTE — Patient Instructions (Signed)
ALWAYS FEEL FREE TO CALL OUR OFFICE WITH ANY PROBLEMS OR QUESTIONS [phone])  **PLEASE NOTE** ALL MEDICATION REFILL REQUESTS (INCLUDING CONTROLLED SUBSTANCES) NEED TO BE MADE AT LEAST 7 DAYS PRIOR TO REFILL BEING DUE. ANY REFILL REQUESTS INSIDE THAT TIME FRAME MAY RESULT IN DELAYS IN RECEIVING YOUR PRESCRIPTION.    GO HOME AND REST. PLACE A COLD COMPRESS.  YOU NEED TO RELAX AND GET YOUR MIND OFF YOUR PAIN. TAKE YOUR ALBUTEROL INHALER.

## 2023-01-31 NOTE — Progress Notes (Signed)
HPI: Patty Salas is a 67 y.o. female here in follow up of her chronic pain. She came in short of breath today. We checked her vs and everything was generally stable, except for sl elevated bp and hr. She reports increased pain in her left arm. The pain seems worse in her left wrist and elbow, but she also has local pain in the shoulder. She denies any neck sx. The intention today was to perform trigger point injections.   She remains on hydrocodone for pain but also uses flexeril and gabapentin. She's off of fentanyl.   She is still dealing with hemorrhoids and would like a local GI referral.    ROS Past Medical History:  Diagnosis Date   Anxiety    Arthritis    Asthma    Bipolar 2 disorder (HCC)    pt stated, "I have Bipolar 2 and it is remission"   Bipolar affective (HCC)    CAD (coronary artery disease), native artery transplanted heart    Nonobstructive by coronary CTA 08/2021 with less than 25% stenosis in the LAD, RCA and left circumflex.   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   Full dentures    Gout    Headache(784.0)    migraines   Herpesviral infection    Hypertension    IBS (irritable bowel syndrome)    with diarrhea   Lymphadenopathy    Myalgia and myositis, unspecified    PAT (paroxysmal atrial tachycardia)    asymptomatic 10 beat run on event monitor 09/2020   PFO (patent foramen ovale)    Small PFO noted on coronary CTA   Pneumonia    PONV (postoperative nausea and vomiting)    PTSD (post-traumatic stress disorder)    Renal calculi    Restless legs syndrome (RLS)    Thoracic radiculopathy    Vitamin D deficiency    Wears glasses    Past Surgical History:  Procedure Laterality Date    kidney stones      ABDOMINAL ADHESION SURGERY     ABDOMINAL HYSTERECTOMY  1995   partial- hemmoraged after surgery-stitch came loose   APPENDECTOMY  1993   hemmoraged after surgery- stitch came loose   CERVICAL DISC SURGERY  06/04/2001   with fusion   CHOLECYSTECTOMY  1985   colonoscopy     COLONOSCOPY     CYSTOSCOPY     FOOT SURGERY     lt   INCISIONAL HERNIA REPAIR N/A 04/22/2021   Procedure: LAPAROSCOPIC INCISIONAL HERNIA REPAIR WITH MESH;  Surgeon: Berna Bue, MD;  Location: [facility];  Service: General;  Laterality: N/A;   jj stent  07/2002   with ureteroscopy, cystoscopy and then removal of stent in office   LAPAROSCOPIC LYSIS OF ADHESIONS N/A 04/22/2021   Procedure: LYSIS OF ADHESIONS;  Surgeon: Berna Bue, MD;  Location: [facility];  Service: General;  Laterality: N/A;   LITHOTRIPSY     LYMPH NODE BIOPSY Right 03/06/2014   Procedure: right groin LYMPH NODE BIOPSY;  Surgeon: Robyne Askew, MD;  Location: [facility];  Service: General;  Laterality: Right;   LYMPHADENECTOMY N/A 12/29/2015  Procedure: RETROPERITONEAL LYMPHADENECTOMY;  Surgeon: Sebastian Ache, MD;  Location: [facility];  Service: Urology;  Laterality: N/A;   NECK SURGERY  2002   ROBOT ASSISTED LAPAROSCOPIC NEPHRECTOMY Left 12/29/2015   Procedure: XI ROBOTIC ASSISTED LEFT LAPAROSCOPIC NEPHRECTOMY;  Surgeon: Sebastian Ache, MD;  Location: [facility];  Service: Urology;  Laterality: Left;   TONSILLECTOMY  1976   Family History  Problem Relation Age of Onset   Cancer Mother        stomach   Migraines Mother    Arthritis Mother    Emphysema Mother    Heart disease Father    Hypertension Father    Cancer Father        colon   Dementia Father    Hypertension Brother    Migraines Brother    Hypertension Brother    Migraines Brother    Alcohol abuse Brother    Bipolar disorder Brother    Migraines Daughter    Arthritis Maternal Grandmother    Cancer Maternal Grandmother        stomach   Diabetes Maternal  Grandmother    Stroke Maternal Grandmother    Cancer Maternal Aunt        lung   Emphysema Maternal Aunt    Cancer Paternal Uncle        colon   Hypertension Maternal Aunt    Heart disease Maternal Aunt    Cancer Paternal Uncle        lung   Social History:  reports that she has never smoked. She has never used smokeless tobacco. She reports that she does not drink alcohol and does not use drugs. Allergies:  Allergies  Allergen Reactions   Aripiprazole Anaphylaxis    Stiffened muscles, extrapyramidal effects all over body.  Other reaction(s): Other (See Comments) Stiffened muscles Stiffened muscles, extrapyramidal effects all over body.  Stiffened muscles Stiffened muscles, extrapyramidal effects all over body.     Aspirin Hives, Itching and Anaphylaxis   Bromfenac Sodium Anaphylaxis   Chlorpromazine Anaphylaxis    Other reaction(s): Other (See Comments) Stiffened muscles Stiffened muscles    Cymbalta [Duloxetine Hcl] Anaphylaxis    Suicidal if in combination w/ Axert   Divalproex Sodium Anaphylaxis   Duloxetine Anaphylaxis    Suicidal if in combination w/ Axert Suicidal if in combination w/ Axert   Duract [Bromfenac] Anaphylaxis   Flurazepam Anaphylaxis   Hydrochlorothiazide Anaphylaxis    Pt loses facial movement uncontrolled;  Pt loses facial movement uncontrolled;  Pt loses facial movement uncontrolled;    Imitrex [Sumatriptan Succinate] Anaphylaxis   Latex Anaphylaxis   Mellaril Anaphylaxis   Metoclopramide Anaphylaxis and Other (See Comments)    Headaches.  Other reaction(s): Headache, Other (See Comments) headache Headaches.  headache Headaches.   Other reaction(s): headache   Olanzapine Anaphylaxis   Olanzapine Anaphylaxis and Other (See Comments)    Unknown    Other Itching, Other (See Comments), Hives, Nausea And Vomiting, Rash and Anaphylaxis    Dust, mold, feathers, wool, flannel, cologne, chlorox, household cleaning products, scented candles,  cinnamon, ivory soap, paints, sun sentitive, acidic fruits, ( lemons, limes, strawbery, tartrazine yellow#5 and 6 in foods, MSG food additives, sudiium lauryl sulates, hot peppers, spearmint, wintergreen peppermint, mentol, orange, jalapenos. ---headache, breathing, welps, canker sores inside mouth, uticaria Other reaction(s): Other (See Comments) Potassium-containing Compounds-Undiluted through IV. --Pain.  Dust, mold, feathers, wool, flannel, cologne, chlorox, household cleaning products, scented candles, cinnamon, ivory soap, paints, sun sentitive, acidic fruits, ( lemons, limes, strawbery, tartrazine yellow#5 and  6 in foods, MSG food additives, sudiium lauryl sulates, hot peppers, spearmint, wintergreen peppermint, mentol, orange, jalapenos. ---headache, breathing, welps, canker sores inside mouth, uticaria Hives on back and looked like sun Dust, mold, feathers, wool, flannel, cologne, chlorox, household cleaning products, scented candles, cinnamon, ivory soap, paints, sun sentitive, acidic fruits, ( lemons, limes, strawbery, tartrazine yellow#5 and 6 in foods, MSG food additives, sudiium lauryl sulates, hot peppers, spearmint, wintergreen peppermint, mentol, orange, jalapenos. ---headache, breathing, welps, canker sores inside mouth, uticaria   Penicillins Anaphylaxis   Quetiapine Anaphylaxis    Other reaction(s): Other (See Comments) Bad dreams, too sedating. extreme fatigue and bad dreams extreme fatigue and bad dreams Bad dreams, too sedating.   Statins Hives and Anaphylaxis    Other reaction(s): Other (See Comments) Paranoid affect, muscle spasms.  Paranoid affect, muscle spasms.     Sumatriptan Anaphylaxis and Other (See Comments)   Thioridazine Anaphylaxis   Topamax Anaphylaxis and Other (See Comments)    Confusion, tremor, blurred vision   Topiramate Anaphylaxis    Other reaction(s): Other (See Comments) Confusion, tremor, blurred vision Confusion, tremor, blurred vision     Trifluoperazine Anaphylaxis    Other reaction(s): Other (See Comments) Stiffened muscles Stiffened muscles    Aripiprazole Other (See Comments)    Stiffened muscles   Dalmane [Flurazepam Hcl] Other (See Comments)    Stiffened all muscles   Darifenacin Hydrobromide Er Hives    Hives on back and looked like sunburn   Metoclopramide Hcl Other (See Comments)    headache   Seroquel [Quetiapine Fumerate] Other (See Comments)    extreme fatigue and bad dreams   Stelazine Other (See Comments)    Stiffened muscles   Thorazine [Chlorpromazine Hcl] Other (See Comments)    Stiffened muscles   Allopurinol Hives and Other (See Comments)   Aspirin Effervescent Other (See Comments)    Mouth ulcers, swallowing problems.  Mouth ulcers, swallowing problems.  Mouth ulcers, swallowing problems.     Barium Sulfate Itching and Other (See Comments)   Benzocaine Other (See Comments)    orthicoat sprays containing menthol or lemon flavor--breaks inside of mouth out, red rash, mouth ulcers.  Other reaction(s): Other (See Comments) orthicoat sprays containing menthol or lemon flavor--breaks inside of mouth out, red rash, mouth ulcers.  orthicoat sprays containing menthol or lemon flavor--breaks inside of mouth out, red rash, mouth ulcers.    Biofreeze [Menthol (Topical Analgesic)] Hives and Itching   Camphor-Menthol     Other reaction(s): Not available   Capsaicin Itching    Redness  Redness Redness    Capsaicin-Menthol Other (See Comments)   Clindamycin Itching and Other (See Comments)   Clindamycin/Lincomycin     Bad reflux and loose stools    Clove Oil Itching and Other (See Comments)   Colchicine Hives and Other (See Comments)   Cycloheximide Itching and Other (See Comments)   Darifenacin Hives   Diatrizoate Itching    welps  Other reaction(s): Other (See Comments) welps  welps  Other reaction(s): Not available   Diflucan [Fluconazole] Hives and Itching   Erythromycin Other (See  Comments)    Other reaction(s): Unknown   Garlic Itching and Other (See Comments)    Mouth swelling   Iohexol      Code: HIVES, Desc: pt broke out in red rash and hives after CT injection on 12/07/09.-pt needs 13 hr prep kit, Onset Date: 16109604  Other reaction(s): Other (See Comments)  Code: HIVES, Desc: pt broke out in red rash and hives after  CT injection on 12/07/09.-pt needs 13 hr prep kit, Onset Date: 16109604  Code: HIVES, Desc: pt broke out in red rash and hives after CT injection on 12/07/09.-pt needs 13 hr prep kit, Onset Date: [id number]   Ivp Dye [Iodinated Contrast Media]    Metronidazole Other (See Comments)   Mirabegron     swelling Other reaction(s): Other (See Comments) swelling swelling   Miralax [Polyethylene Glycol] Nausea And Vomiting   Moxifloxacin Itching    Other reaction(s): Not available   Nsaids     Mouth ulcers, swelling of mouth.    Oxybutynin Diarrhea   Polyethylene Glycol 3350 Itching   Potassium Chloride Itching   Potassium-Containing Compounds     Undiluted through IV. --Pain.    Prednisone Itching and Other (See Comments)    No  Sleep at night.  Other reaction(s): Insomnia, Other (See Comments) No  Sleep at night.  No  Sleep at night.   Other reaction(s): insomnia   Psyllium Other (See Comments)    Mouth ulcers, swallowing problems. Other reaction(s): Other (See Comments) Mouth ulcers, swallowing problems. Mouth ulcers, swallowing problems.    Red Dye     Other reaction(s): Unknown   Seroquel [Quetiapine Fumarate]     Bad dreams, too sedating.   Simvastatin Other (See Comments)    Paranoid affect, muscle spasms.    Urocit - K [Potassium Citrate]     Breaks mouth out   Avelox [Moxifloxacin Hcl In Nacl] Nausea And Vomiting   Barium-Containing Compounds Rash   Butalbital-Aspirin-Caffeine Itching, Rash and Other (See Comments)    Welps, Welps, Welps,    Diclofenac Rash and Other (See Comments)   Doxycycline Nausea Only and Other (See  Comments)    Stomach upset.  Stomach upset.  Stomach upset.    E-Mycin [Erythromycin Base] Nausea Only   Flagyl [Metronidazole Hcl] Nausea And Vomiting   Lamotrigine Rash and Other (See Comments)   Pregabalin     Blurry vision, muscle stiffness    Pregabalin Other (See Comments)    Other reaction(s): Other (See Comments) Blurry vision, muscle stiffness  Blurry vision, muscle stiffness     Risperidone Other (See Comments)    Too sedating   Risperidone Other (See Comments)    Too sedating.  Other reaction(s): Other (See Comments) Too sedating.  Too sedating Too sedating Too sedating.     Toradol [Ketorolac Tromethamine] Itching and Rash   (Not in a hospital admission)   Home:    Functional History:   Functional Status:  Mobility:          ADL:    Cognition:      Blood pressure (!) 150/90, pulse 100, temperature 99.1 F (37.3 C), temperature source Oral, height 5\' 5"  (1.651 m), weight 68 kg, SpO2 98 %. Physical Exam Gen: no distress, normal appearing HEENT: oral mucosa pink and moist, NCAT Cardio: Reg rate Chest: normal effort, normal rate of breathing Abd: soft, non-distended Ext: no edema Psych: extremely anxious Skin: intact Neuro: Alert and oriented x 3. Normal insight and awareness. Intact Memory. Normal language and speech. Cranial nerve exam unremarkable. MMT: 4-5/5 with pain inhibition.   Musculoskeletal: left elbow and wrist with pain with minimal palpation. Painful to touch along anterior shoulder. Generalized tender points along shoulder girdle and back.     No results found. However, due to the size of the patient record, not all encounters were searched. Please check Results Review for a complete set of results. No results found.  Assessment &  Plan:  1 History of fibromyalgia with myofascial pain and multiple trigger points -.Continue with Heat and exercise Regime.  -Continue with  with  gabapentin.   -will hold off on trigger points  today due to her anxiety state 2 Chronic migraine headaches.Continue to Monitor. S/P Botox.on 05/30/2017.   3. Midline Low Back Pain/ Lumbar Spondylosis/Lumbar degenerative disk disease, L4-5/ Lumbar Radiculitis:  -Continue with HEP and current medication regimen.  -Continue Hydrocodone 5/325 mg one tablet every 8 hours as needed #80. . - Second script sent for the following month to accommodate scheduled appointment .   -we are trying to wean slowly 4. 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 Urology Following.    5. 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   6. Cervicalgia/ Cervical RadiculitisCervical Spondylosis:  Continue current medication regiment with  Gabapentin. Continue  with HEP and Continue to monitor.   7. Muscle Spasm: Myofascial Pain: She is svcheduled for Trigger Point Injection with Dr Riley Kill : Continue current medication regimen with Flexeril as needed.   8. Bilateral Greater Trochanter Bursitis: Continue to alternate with ice and heat therapy. Continue HEP as Tolerated. Continue to monitor.   9. Bilateral Knee Pain: S/P Knee Injection by Dr Lajoyce Corners on 02/23/2020. Orthopedics Following.    10. Left Groin Pain: No complaints today.  11. Fall at Home: Educated on falls prevention, she verbalizes understanding. Continue to Monitor. 08/15/2022  12. Lollie Sails Tenosynovitis: No Complaints today. Continue to Monitor 13. New left arm pain: she has diffuse pain. There is no focal tendonitis present. Left arm hurts to simple touch at al levels. I suspect anxiety component.   -don't recommend any injections  -consider f/u cervical imaging 14. Hx of internal/external hemorrhoids with bleed  -made GI referral   Twenty minutes of face to face patient care time were spent during this visit. All questions were encouraged and answered.  Follow up with np IN 6 WEEKS .   Ranelle Oyster, MD 01/31/2023

## 2023-02-06 ENCOUNTER — Telehealth: Payer: Self-pay | Admitting: Neurology

## 2023-02-06 MED ORDER — PROMETHAZINE HCL 25 MG PO TABS: ORAL_TABLET | ORAL | 0 refills | Status: DC

## 2023-02-06 NOTE — Telephone Encounter (Signed)
Pt called stating that she is needing a refill on her promethazine (PHENERGAN) 25 MG tablet She stated that she has seen [name] and she would like for provider to go over the notes and would also like to inform provider that she has started seeing Dr. Betti Cruz again.

## 2023-02-07 ENCOUNTER — Telehealth: Payer: Self-pay | Admitting: Physical Medicine & Rehabilitation

## 2023-02-07 DIAGNOSIS — M5412 Radiculopathy, cervical region: Secondary | ICD-10-CM

## 2023-02-07 NOTE — Telephone Encounter (Signed)
Patient called in and states her arm pain had been getting worse and would like to have MRI ordered if possible . States it was talked about it during visit feels like its her neck pain radiating to her arm .  We rescheduled her trigger point appointment to July

## 2023-02-07 NOTE — Telephone Encounter (Signed)
I ordered MRI of C-spine

## 2023-02-07 NOTE — Addendum Note (Signed)
Addended by: Faith Rogue T on: 02/07/2023 02:55 PM   Modules accepted: Orders

## 2023-02-08 ENCOUNTER — Telehealth: Payer: Self-pay

## 2023-02-08 NOTE — Telephone Encounter (Signed)
Patient called stating she spoke to you on 5/1 about getting a Medrol dosepak. Can't barely move arm, swollen.

## 2023-02-09 NOTE — Telephone Encounter (Signed)
Return  Ms. Helmut Muster call,  Her last medrol Dose Pak was In March , she is not able to received Medrol dose Pak at this time. She verbalizes understanding. She will F/U with Ortho,

## 2023-02-09 NOTE — Progress Notes (Deleted)
Cardiology Office Note:    Date:  02/09/2023   ID:  Patty Salas, DOB 04-12-1956, MRN [id number]  PCP:  Carlean Jews, NP  [facility] Providers Cardiologist:  None { Click to update primary MD,subspecialty MD or APP then REFRESH:1}  *** Referring MD: Carlean Jews, NP   Chief Complaint:  No chief complaint on file. {Click here for Visit Info    :1}    History of Present Illness:   Patty Salas is a 67 y.o. female with  a hx of anxiety, asthma, fibromyalgia, HTN who was referred for evaluation of LE edema.  She has a hx of renal cell CA and is s/p nephrectomy.  She recently had problems with renal stones. She has a hx of IBS and diarrhea as well.  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     She has chronic DOE related to asthma and had been on albuterol but had not been taking it at her last OV.    2D echo showed normal heart function with EF 60-65% with G1DD.  Coronary CTA showed coronary Ca score of 117 with minimal CAD < 1-24% in the LAD/LCx and RCA and a small PFO.     Patient last had a video visit with Dr. Mayford Knife 10/11/21 and trouble with LE edema thought possibly due to pelvic lymphoma and to follow with oncology.  Also referred to lipid clinic.          Past Medical History:  Diagnosis Date   Anxiety    Arthritis    Asthma    Bipolar 2 disorder (HCC)    pt stated, "I have Bipolar 2 and it is remission"   Bipolar affective (HCC)    CAD (coronary artery disease), native artery transplanted heart    Nonobstructive by coronary CTA 08/2021 with less than 25% stenosis in the LAD, RCA and left circumflex.   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   Full dentures    Gout    Headache(784.0)     migraines   Herpesviral infection    Hypertension    IBS (irritable bowel syndrome)    with diarrhea   Lymphadenopathy    Myalgia and myositis, unspecified    PAT (paroxysmal atrial tachycardia)    asymptomatic 10 beat run on event monitor 09/2020   PFO (patent foramen ovale)    Small PFO noted on coronary CTA   Pneumonia    PONV (postoperative nausea and vomiting)    PTSD (post-traumatic stress disorder)    Renal calculi    Restless legs syndrome (RLS)    Thoracic radiculopathy    Vitamin D deficiency    Wears glasses    Current Medications: No outpatient medications have been marked as taking for the 02/13/23 encounter (Appointment) with Dyann Kief, PA-C.    Allergies:   Aripiprazole, Aspirin, Bromfenac sodium, Chlorpromazine, Cymbalta [duloxetine hcl], Divalproex sodium, Duloxetine, Duract [bromfenac], Flurazepam, Hydrochlorothiazide, Imitrex [sumatriptan succinate], Latex, Mellaril, Metoclopramide, Olanzapine, Olanzapine, Other, Penicillins, Quetiapine, Statins, Sumatriptan, Thioridazine, Topamax, Topiramate, Trifluoperazine, Aripiprazole, Dalmane [flurazepam hcl], Darifenacin hydrobromide er, Metoclopramide hcl, Seroquel [quetiapine fumerate], Stelazine, Thorazine [chlorpromazine hcl], Allopurinol, Aspirin effervescent, Barium sulfate, Benzocaine, Biofreeze [  menthol (topical analgesic)], Camphor-menthol, Capsaicin, Capsaicin-menthol, Clindamycin, Clindamycin/lincomycin, Clove oil, Colchicine, Cycloheximide, Darifenacin, Diatrizoate, Diflucan [fluconazole], Erythromycin, Garlic, Iohexol, Ivp dye [iodinated contrast media], Metronidazole, Mirabegron, Miralax [polyethylene glycol], Moxifloxacin, Nsaids, Oxybutynin, Polyethylene glycol 3350, Potassium chloride, Potassium-containing compounds, Prednisone, Psyllium, Red dye, Seroquel [quetiapine fumarate], Simvastatin, Urocit - k [potassium citrate], Avelox [moxifloxacin hcl in nacl], Barium-containing compounds,  Butalbital-aspirin-caffeine, Diclofenac, Doxycycline, E-mycin [erythromycin base], Flagyl [metronidazole hcl], Lamotrigine, Pregabalin, Pregabalin, Risperidone, Risperidone, and Toradol [ketorolac tromethamine]   Social History   Tobacco Use   Smoking status: Never   Smokeless tobacco: Never  Vaping Use   Vaping Use: Never used  Substance Use Topics   Alcohol use: No   Drug use: No    Family Hx: The patient's family history includes Alcohol abuse in her brother; Arthritis in her maternal grandmother and mother; Bipolar disorder in her brother; Cancer in her father, maternal aunt, maternal grandmother, mother, paternal uncle, and paternal uncle; Dementia in her father; Diabetes in her maternal grandmother; Emphysema in her maternal aunt and mother; Heart disease in her father and maternal aunt; Hypertension in her brother, brother, father, and maternal aunt; Migraines in her brother, brother, daughter, and mother; Stroke in her maternal grandmother.  ROS     Physical Exam:    VS:  There were no vitals taken for this visit.    Wt Readings from Last 3 Encounters:  01/31/23 150 lb (68 kg)  12/15/22 155 lb 8 oz (70.5 kg)  11/30/22 156 lb 6.4 oz (70.9 kg)    Physical Exam  GEN: Well nourished, well developed, in no acute distress  HEENT: normal  Neck: no JVD, carotid bruits, or masses Cardiac:RRR; no murmurs, rubs, or gallops  Respiratory:  clear to auscultation bilaterally, normal work of breathing GI: soft, nontender, nondistended, + BS Ext: without cyanosis, clubbing, or edema, Good distal pulses bilaterally MS: no deformity or atrophy  Skin: warm and dry, no rash Neuro:  Alert and Oriented x 3, Strength and sensation are intact Psych: euthymic mood, full affect        EKGs/Labs/Other Test Reviewed:    EKG:  EKG is *** ordered today.  The ekg ordered today demonstrates ***  Recent Labs: 03/30/2022: ALT 14; BUN 11; Creatinine, Ser 0.88; Hemoglobin 12.2; Platelets 282;  Potassium 3.6; Sodium 140   Recent Lipid Panel No results for input(s): "CHOL", "TRIG", "HDL", "VLDL", "LDLCALC", "LDLDIRECT" in the last 8760 hours.   Prior CV Studies: {Select studies to display:26339}    ECHO COMPLETE WITH IMAGING ENHANCING AGENT 10/07/2020   Narrative ECHOCARDIOGRAM REPORT       Patient Name:   MARGY WILLITTS [facility] Date of Exam: 10/07/2020 Medical Rec #:  [id number]      Height:       66.5 in Accession #:    [id number]     Weight:       156.2 lb Date of Birth:  May 21, 1956      BSA:          1.810 m Patient Age:    67 years       BP:           129/68 mmHg Patient Gender: F              HR:           103 bpm. Exam Location:  [address]   Procedure: 2D Echo, Cardiac Doppler, Color Doppler and Intracardiac Opacification Agent   MODIFIED REPORT: This report was modified by Weston Brass MD on  10/07/2020 due to report upload error. Indications:     R60.0 Lower extremtiy edema.   History:         Patient has no prior history of Echocardiogram examinations.   Sonographer:     Daphine Deutscher RDCS Referring Phys:  [id number] TRACI R TURNER Diagnosing Phys: Weston Brass MD   IMPRESSIONS     1. Left ventricular ejection fraction, by estimation, is 60 to 65%. The left ventricle has normal function. The left ventricle has no regional wall motion abnormalities. Left ventricular diastolic parameters are consistent with Grade I diastolic dysfunction (impaired relaxation). 2. Right ventricular systolic function is normal. The right ventricular size is normal. There is normal pulmonary artery systolic pressure. The estimated right ventricular systolic pressure is 21.8 mmHg. 3. The mitral valve is normal in structure. No evidence of mitral valve regurgitation. No evidence of mitral stenosis. 4. The aortic valve is tricuspid. There is mild calcification of the aortic valve. Aortic valve regurgitation is not visualized. No aortic stenosis is present. 5. The inferior vena  cava is normal in size with greater than 50% respiratory variability, suggesting right atrial pressure of 3 mmHg.   FINDINGS Left Ventricle: Left ventricular ejection fraction, by estimation, is 60 to 65%. The left ventricle has normal function. The left ventricle has no regional wall motion abnormalities. Definity contrast agent was given IV to delineate the left ventricular endocardial borders. The left ventricular internal cavity size was normal in size. There is no left ventricular hypertrophy. Left ventricular diastolic parameters are consistent with Grade I diastolic dysfunction (impaired relaxation).   Right Ventricle: The right ventricular size is normal. No increase in right ventricular wall thickness. Right ventricular systolic function is normal. There is normal pulmonary artery systolic pressure. The tricuspid regurgitant velocity is 2.17 m/s, and with an assumed right atrial pressure of 3 mmHg, the estimated right ventricular systolic pressure is 21.8 mmHg.   Left Atrium: Left atrial size was normal in size.   Right Atrium: Right atrial size was normal in size.   Pericardium: There is no evidence of pericardial effusion.   Mitral Valve: The mitral valve is normal in structure. No evidence of mitral valve regurgitation. No evidence of mitral valve stenosis.   Tricuspid Valve: The tricuspid valve is normal in structure. Tricuspid valve regurgitation is trivial. No evidence of tricuspid stenosis.   Aortic Valve: The aortic valve is tricuspid. There is mild calcification of the aortic valve. Aortic valve regurgitation is not visualized. No aortic stenosis is present.   Pulmonic Valve: The pulmonic valve was normal in structure. Pulmonic valve regurgitation is trivial. No evidence of pulmonic stenosis.   Aorta: The aortic root is normal in size and structure.   Venous: The inferior vena cava is normal in size with greater than 50% respiratory variability, suggesting right atrial  pressure of 3 mmHg.   IAS/Shunts: There is redundancy of the interatrial septum. No atrial level shunt detected by color flow Doppler.     LEFT VENTRICLE PLAX 2D LVIDd:         3.60 cm  Diastology LVIDs:         2.50 cm  LV e' medial:    7.07 cm/s LV PW:         1.00 cm  LV E/e' medial:  14.3 LV IVS:        0.80 cm  LV e' lateral:   6.64 cm/s LVOT diam:     1.80 cm  LV E/e' lateral: 15.2 LV SV:  70 LV SV Index:   39 LVOT Area:     2.54 cm     RIGHT VENTRICLE             IVC RV Basal diam:  3.30 cm     IVC diam: 1.20 cm RV S prime:     14.50 cm/s TAPSE (M-mode): 2.4 cm   LEFT ATRIUM             Index       RIGHT ATRIUM          Index LA diam:        3.60 cm 1.99 cm/m  RA Area:     9.21 cm LA Vol (A2C):   24.5 ml 13.53 ml/m RA Volume:   20.40 ml 11.27 ml/m LA Vol (A4C):   21.1 ml 11.65 ml/m LA Biplane Vol: 22.8 ml 12.59 ml/m AORTIC VALVE LVOT Vmax:   125.50 cm/s LVOT Vmean:  90.200 cm/s LVOT VTI:    0.275 m   AORTA Ao Root diam: 3.10 cm Ao Asc diam:  3.10 cm   MITRAL VALVE                TRICUSPID VALVE MV Area (PHT): 2.99 cm     TR Peak grad:   18.8 mmHg MV Decel Time: 254 msec     TR Vmax:        217.00 cm/s MV E velocity: 101.00 cm/s MV A velocity: 120.00 cm/s  SHUNTS MV E/A ratio:  0.84         Systemic VTI:  0.28 m Systemic Diam: 1.80 cm   Weston Brass MD Electronically signed by Weston Brass MD Signature Date/Time: 10/07/2020/3:31:12 PM       Final (Updated)   CT CORONARY MORPH W/CTA COR W/SCORE W/CA W/CM &/OR WO/CM 08/16/2021   Addendum 08/16/2021  2:58 PM ADDENDUM REPORT: 08/16/2021 14:55   CLINICAL DATA:  Chest pain   EXAM: Cardiac/Coronary CTA   TECHNIQUE: A non-contrast, gated CT scan was obtained with axial slices of 3 mm through the heart for calcium scoring. Calcium scoring was performed using the Agatston method. A 100 kV prospective, gated, contrast cardiac scan was obtained. Gantry rotation speed was 250 msecs  and collimation was 0.6 mm. Two sublingual nitroglycerin tablets (0.8 mg) were given. The 3D data set was reconstructed in 5% intervals of the 35-75% of the R-R cycle. Diastolic phases were analyzed on a dedicated workstation using MPR, MIP, and VRT modes. The patient received 95 cc of contrast.   FINDINGS: Image quality: Excellent.   Noise artifact is: Limited.   Coronary Arteries:  Normal coronary origin.  Right dominance.   Left main: The left main is a large caliber vessel with a normal take off from the left coronary cusp that bifurcates to form a left anterior descending artery and a left circumflex artery. There is no plaque or stenosis.   Left anterior descending artery: The proximal LAD contains minimal calcified plaque (<25%). The mid and distal segments are patent. The LAD gives off 1 large diagonal branch with minimal non-calcified plaque (<25%).   Left circumflex artery: The LCX is non-dominant. There is minimal calcified plaque (<25%) in the mid segment. OM1 contains minimal calcified plaque (<25%). OM2 is patent.   Right coronary artery: The RCA is dominant with normal take off from the right coronary cusp. There is minimal calcified plaque (<25%) in the proximal segment. The mid and distal segments are patent. The RCA terminates as a PDA  and right posterolateral branch without evidence of plaque or stenosis.   Right Atrium: Right atrial size is within normal limits.   Right Ventricle: The right ventricular cavity is within normal limits.   Left Atrium: Left atrial size is normal in size with no left atrial appendage filling defect. A small PFO is present.   Left Ventricle: The ventricular cavity size is within normal limits. There are no stigmata of prior infarction. There is no abnormal filling defect.   Pulmonary arteries: Normal in size without proximal filling defect.   Pulmonary veins: Normal pulmonary venous drainage.   Pericardium: Normal  thickness with no significant effusion or calcium present.   Cardiac valves: The aortic valve is trileaflet without significant calcification. The mitral valve is normal structure without significant calcification.   Aorta: Normal caliber with no significant disease.   Extra-cardiac findings: See attached radiology report for non-cardiac structures.   IMPRESSION: 1. Coronary calcium score of 117. This was 81st percentile for age-, sex, and race-matched controls.   2. Normal coronary origin with right dominance.   3. Minimal CAD (<25%) in the LAD/LCX/RCA.   4. Small PFO.   RECOMMENDATIONS: 1. Minimal non-obstructive CAD (0-24%). Consider non-atherosclerotic causes of chest pain. Consider preventive therapy and risk factor modification.   Lennie Odor, MD     Electronically Signed By: Lennie Odor M.D. On: 08/16/2021 14:55   Narrative EXAM: OVER-READ INTERPRETATION  CT CHEST   The following report is an over-read performed by radiologist Dr. Richarda Overlie of [facility] on 08/16/2021. This over-read does not include interpretation of cardiac or coronary anatomy or pathology. The coronary calcium score/coronary CTA interpretation by the cardiologist is attached.   COMPARISON:  06/04/2021 and PET-CT 03/06/2016   FINDINGS: Vascular: Normal caliber of the visualized thoracic aorta. Main pulmonary arteries are patent.   Mediastinum/Nodes: Visualized mediastinal structures are normal.   Lungs/Pleura: Small density in the right middle lobe on series 11, image 24 is unchanged since 2017. Otherwise, the visualized lower lungs are clear. No pleural effusions.   Upper Abdomen: Images of the upper abdomen are unremarkable.   Musculoskeletal: No acute abnormality.   IMPRESSION: Negative over-read examination. No acute abnormality involving the extracardiac structures.   Electronically Signed: By: Richarda Overlie M.D. On: 08/16/2021 11:42           Risk  Assessment/Calculations/Metrics:   {Does this patient have ATRIAL FIBRILLATION?[phone]}     No BP recorded.  {Refresh Note OR Click here to enter BP  :1}***    ASSESSMENT & PLAN:   No problem-specific Assessment & Plan notes found for this encounter.   HTN -BP is adequately controlled on exam today -diet controlled   2.  LE edema -? Whether this is related to her pelvic lymphoma -2D echocardiogram showed normal LV function with grade 1 diastolic dysfunction -I encouraged her to follow less than 2 g sodium diet to prevent lower extremity edema -continue lasix as needed -continue to follow with Oncology   3.  Sinus tachycardia/PAT -HR remains mildly elevated again at 102 bpm today but denies any palpitations -likely related to chronic pain and deconditioning -She wore a Zio patch in September 20, 2020 for elevated heart rate and average heart rate was 89 bpm   4.  ASCAD -coronary Ca score of 117 with minimal CAD < 1-24% in the LAD/LCx and RCA -She denies any anginal symptoms -Her last LDL was 155 and she was referred to our lipid clinic with Pharm.D but this  never occurred so I will place another referral>> she is statin intolerant -She gets hives with aspirin   5.  HLD -LDL goal is less than 70 -Her last LDL was 155 -We will set up for Pharm.D. in lipid clinic as she is statin intolerant -She would benefit from addition of a PCSK9 inhibitor          {Are you ordering a CV Procedure (e.g. stress test, cath, DCCV, TEE, etc)?   Press F2        :[id number]}   Dispo:  No follow-ups on file.   Medication Adjustments/Labs and Tests Ordered: Current medicines are reviewed at length with the patient today.  Concerns regarding medicines are outlined above.  Tests Ordered: No orders of the defined types were placed in this encounter.  Medication Changes: No orders of the defined types were placed in this encounter.  Signed, Jacolyn Reedy, PA-C  02/09/2023 8:14 AM    [facility] [address] [place], [place], [place]  [address] Phone: [phone]; Fax: [phone]

## 2023-02-13 ENCOUNTER — Ambulatory Visit: Payer: Medicare Other | Admitting: Physician Assistant

## 2023-02-22 ENCOUNTER — Telehealth: Payer: Self-pay | Admitting: Neurology

## 2023-02-22 ENCOUNTER — Ambulatory Visit: Payer: Medicare Other | Admitting: Nurse Practitioner

## 2023-02-22 NOTE — Telephone Encounter (Signed)
Too early for refill  

## 2023-02-22 NOTE — Telephone Encounter (Signed)
Pt is needing a refill on her promethazine (PHENERGAN) 25 MG tablet  This was a very extended phone call. She stated that she knows its to early to refill this medication but she is under a lot of stress and is wanting to know if the provider can fill this for her.  Please advise.

## 2023-03-06 ENCOUNTER — Other Ambulatory Visit: Payer: Self-pay | Admitting: Neurology

## 2023-03-06 MED ORDER — PROMETHAZINE HCL 25 MG PO TABS: ORAL_TABLET | ORAL | 0 refills | Status: DC

## 2023-03-06 NOTE — Addendum Note (Signed)
Addended by: Danne Harbor on: 03/06/2023 09:50 AM   Modules accepted: Orders

## 2023-03-06 NOTE — Telephone Encounter (Signed)
  Requested Prescriptions   Pending Prescriptions Disp Refills   promethazine (PHENERGAN) 25 MG tablet 15 tablet 0    Sig: Take 1/2 tablet by mouth every 6 hours as needed for Nausea and Vomiting   Last seen 07/12/22, next appt 04/30/23. I wanted to send this to provider to see if she wants to refill. I know the pt uses up this med fast and always requested an early fill.   Dispenses   Dispensed Days Supply Quantity Provider Pharmacy  PROMETHAZINE 25MG  TABLETS 02/06/2023 7 15 each Glean Salvo, NP [facility] #...  PROMETHAZINE 25MG  TABLETS 01/15/2023 7 15 each Glean Salvo, NP [facility] DR[facility] STORE #...  PROMETHAZINE 25MG  TABLETS 12/25/2022 7 15 each Glean Salvo, NP [facility]ORE #...  PROMETHAZINE 25MG  TABLETS 12/01/2022 30 15 each Glean Salvo, NP [facility] DR[facility] STORE #...  PROMETHAZINE 25MG  TABLETS 10/17/2022 10 20 each Glean Salvo, NP [facility] STORE #...  PROMETHAZINE 25MG  TABLETS 09/20/2022 10 20 each Glean Salvo, NP [facility] DR[facility] STORE #...  PROMETHAZINE 25MG  TABLETS 08/21/2022 10 20 each Glean Salvo, NP [facility] DR[facility] STORE #...  PROMETHAZINE 25MG  TABLETS 07/12/2022 10 20 each Glean Salvo, NP [facility] DR[facility] STORE #...  PROMETHAZINE 25MG  TABLETS 06/08/2022 15 30 each Glean Salvo, NP [facility] DRUG STORE #...  PROMETHAZINE 25MG  TAB 05/08/2022 15 30 tablet Glean Salvo, NP [facility] [phone] - G...  PROMETHAZINE 25 MG TABLET 04/07/2022 15 30 each Glean Salvo, NP [facility] [phone] - G.Marland KitchenMarland Kitchen

## 2023-03-06 NOTE — Telephone Encounter (Signed)
Pt is requesting a refill for promethazine (PHENERGAN) 25 MG tablet .  Pharmacy: [facility] [phone]

## 2023-03-07 ENCOUNTER — Ambulatory Visit: Payer: Medicare Other | Admitting: Family

## 2023-03-12 ENCOUNTER — Other Ambulatory Visit: Payer: Medicare Other

## 2023-03-14 ENCOUNTER — Encounter: Payer: Medicare Other | Attending: Physical Medicine & Rehabilitation | Admitting: Registered Nurse

## 2023-03-14 ENCOUNTER — Encounter: Payer: Self-pay | Admitting: Registered Nurse

## 2023-03-14 VITALS — BP 157/84 | HR 107 | Temp 100.9°F | Ht 65.0 in | Wt 152.3 lb

## 2023-03-14 DIAGNOSIS — M654 Radial styloid tenosynovitis [de Quervain]: Secondary | ICD-10-CM

## 2023-03-14 DIAGNOSIS — M5412 Radiculopathy, cervical region: Secondary | ICD-10-CM

## 2023-03-14 DIAGNOSIS — M47812 Spondylosis without myelopathy or radiculopathy, cervical region: Secondary | ICD-10-CM | POA: Diagnosis not present

## 2023-03-14 DIAGNOSIS — M546 Pain in thoracic spine: Secondary | ICD-10-CM

## 2023-03-14 DIAGNOSIS — G8929 Other chronic pain: Secondary | ICD-10-CM | POA: Insufficient documentation

## 2023-03-14 DIAGNOSIS — G894 Chronic pain syndrome: Secondary | ICD-10-CM

## 2023-03-14 DIAGNOSIS — M5416 Radiculopathy, lumbar region: Secondary | ICD-10-CM

## 2023-03-14 DIAGNOSIS — M7712 Lateral epicondylitis, left elbow: Secondary | ICD-10-CM

## 2023-03-14 DIAGNOSIS — M797 Fibromyalgia: Secondary | ICD-10-CM

## 2023-03-14 DIAGNOSIS — M542 Cervicalgia: Secondary | ICD-10-CM

## 2023-03-14 MED ORDER — HYDROCODONE-ACETAMINOPHEN 5-325 MG PO TABS
1.0000 | ORAL_TABLET | Freq: Three times a day (TID) | ORAL | 0 refills | Status: DC | PRN
Start: 2023-03-14 — End: 2023-04-09

## 2023-03-14 NOTE — Progress Notes (Signed)
Subjective:    Patient ID: Patty Salas, female    DOB: [birth year], 67 y.o.   MRN: [id number]  HPI: Patty Salas is a 67 y.o. female who is scheduled for a My-Chart visit, Patty Salas called the office on 03/13/2023, reporting she had fever and diarrhea. Her visit was changed to a My- Chart Visit. I connected with Patty Salas on 06/12/2024by a video enabled telemedicine application and verified that I am speaking with the correct person using two identifiers.  Location: Patient: In her Home Provider: In the Office    I discussed the limitations of evaluation and management by telemedicine and the availability of in person appointments. The patient expressed understanding and agreed to proceed.   She states her pain is located in her neck radiating into her left shoulder, mid- lower back pain radiating into her bilateral lower extremities. She also reports left elbow and right wrist pain, she has a scheduled appointment with Ortho on Friday she reports to address the above.   She rates her pain 10. Her current exercise regime is walking in her home, she is not following her usual exercise regimen at this time. .   Patty Salas her blood Pressure: 126/83 P: 100 Pain Inventory Average Pain 7 Pain Right Now 10 My pain is sharp and stabbing  In the last 24 hours, has pain interfered with the following? General activity 7 Relation with others 7 Enjoyment of life 7 What TIME of day is your pain at its worst? varies Sleep (in general) Fair  Pain is worse with:  movement Pain improves with: medication Relief from Meds: 5  Family History  Problem Relation Age of Onset   Cancer Mother        stomach   Migraines Mother    Arthritis Mother    Emphysema Mother    Heart disease Father    Hypertension Father    Cancer Father        colon   Dementia Father    Hypertension Brother    Migraines Brother    Hypertension Brother    Migraines Brother    Alcohol abuse Brother     Bipolar disorder Brother    Migraines Daughter    Arthritis Maternal Grandmother    Cancer Maternal Grandmother        stomach   Diabetes Maternal Grandmother    Stroke Maternal Grandmother    Cancer Maternal Aunt        lung   Emphysema Maternal Aunt    Cancer Paternal Uncle        colon   Hypertension Maternal Aunt    Heart disease Maternal Aunt    Cancer Paternal Uncle        lung   Social History   Socioeconomic History   Marital status: Married    Spouse name: Not on file   Number of children: 1   Years of education: COLLEGE1   Highest education level: Not on file  Occupational History   Occupation: HOUSEWIFE    Employer: UNEMPLOYED  Tobacco Use   Smoking status: Never   Smokeless tobacco: Never  Vaping Use   Vaping Use: Never used  Substance and Sexual Activity   Alcohol use: No   Drug use: No   Sexual activity: Not on file  Other Topics Concern   Not on file  Social History Narrative   Patient is right handed.   Patient drinks caffeine occasionally.   Social Determinants of Health  Financial Resource Strain: Not on file  Food Insecurity: No Food Insecurity (10/18/2022)   Hunger Vital Sign    Worried About Running Out of Food in the Last Year: Never true    Ran Out of Food in the Last Year: Never true  Transportation Needs: Not on file  Physical Activity: Not on file  Stress: Not on file  Social Connections: Not on file   Past Surgical History:  Procedure Laterality Date    kidney stones     ABDOMINAL ADHESION SURGERY     ABDOMINAL HYSTERECTOMY  1995   partial- hemmoraged after surgery-stitch came loose   APPENDECTOMY  1993   hemmoraged after surgery- stitch came loose   CERVICAL DISC SURGERY  06/04/2001   with fusion   CHOLECYSTECTOMY  1985   colonoscopy     COLONOSCOPY     CYSTOSCOPY     FOOT SURGERY     lt   INCISIONAL HERNIA REPAIR N/A 04/22/2021   Procedure: LAPAROSCOPIC INCISIONAL HERNIA REPAIR WITH MESH;  Surgeon: Berna Bue,  MD;  Location: [facility];  Service: General;  Laterality: N/A;   jj stent  07/2002   with ureteroscopy, cystoscopy and then removal of stent in office   LAPAROSCOPIC LYSIS OF ADHESIONS N/A 04/22/2021   Procedure: LYSIS OF ADHESIONS;  Surgeon: Berna Bue, MD;  Location: [facility];  Service: General;  Laterality: N/A;   LITHOTRIPSY     LYMPH NODE BIOPSY Right 03/06/2014   Procedure: right groin LYMPH NODE BIOPSY;  Surgeon: Robyne Askew, MD;  Location: [facility];  Service: General;  Laterality: Right;   LYMPHADENECTOMY N/A 12/29/2015   Procedure: RETROPERITONEAL LYMPHADENECTOMY;  Surgeon: Sebastian Ache, MD;  Location: [facility];  Service: Urology;  Laterality: N/A;   NECK SURGERY  2002   ROBOT ASSISTED LAPAROSCOPIC NEPHRECTOMY Left 12/29/2015   Procedure: XI ROBOTIC ASSISTED LEFT LAPAROSCOPIC NEPHRECTOMY;  Surgeon: Sebastian Ache, MD;  Location: [facility];  Service: Urology;  Laterality: Left;   TONSILLECTOMY  1976   Past Surgical History:  Procedure Laterality Date    kidney stones     ABDOMINAL ADHESION SURGERY     ABDOMINAL HYSTERECTOMY  1995   partial- hemmoraged after surgery-stitch came loose   APPENDECTOMY  1993   hemmoraged after surgery- stitch came loose   CERVICAL DISC SURGERY  06/04/2001   with fusion   CHOLECYSTECTOMY  1985   colonoscopy     COLONOSCOPY     CYSTOSCOPY     FOOT SURGERY     lt   INCISIONAL HERNIA REPAIR N/A 04/22/2021   Procedure: LAPAROSCOPIC INCISIONAL HERNIA REPAIR WITH MESH;  Surgeon: Berna Bue, MD;  Location: [facility];  Service: General;  Laterality: N/A;   jj stent  07/2002   with ureteroscopy, cystoscopy and then removal of stent in office   LAPAROSCOPIC LYSIS OF ADHESIONS N/A 04/22/2021   Procedure: LYSIS OF ADHESIONS;  Surgeon: Berna Bue, MD;  Location: [facility];  Service: General;  Laterality: N/A;   LITHOTRIPSY     LYMPH NODE BIOPSY Right 03/06/2014   Procedure: right groin LYMPH NODE BIOPSY;  Surgeon: Robyne Askew, MD;   Location: [facility];  Service: General;  Laterality: Right;   LYMPHADENECTOMY N/A 12/29/2015   Procedure: RETROPERITONEAL LYMPHADENECTOMY;  Surgeon: Sebastian Ache, MD;  Location: [facility];  Service: Urology;  Laterality: N/A;   NECK SURGERY  2002   ROBOT ASSISTED LAPAROSCOPIC NEPHRECTOMY Left 12/29/2015   Procedure: XI  ROBOTIC ASSISTED LEFT LAPAROSCOPIC NEPHRECTOMY;  Surgeon: Sebastian Ache, MD;  Location: [facility];  Service: Urology;  Laterality: Left;   TONSILLECTOMY  1976   Past Medical History:  Diagnosis Date   Anxiety    Arthritis    Asthma    Bipolar 2 disorder (HCC)    pt stated, "I have Bipolar 2 and it is remission"   Bipolar affective (HCC)    CAD (coronary artery disease), native artery transplanted heart    Nonobstructive by coronary CTA 08/2021 with less than 25% stenosis in the LAD, RCA and left circumflex.   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   Full dentures    Gout    Headache(784.0)    migraines   Herpesviral infection    Hypertension    IBS (irritable bowel syndrome)    with diarrhea   Lymphadenopathy    Myalgia and myositis, unspecified    PAT (paroxysmal atrial tachycardia)    asymptomatic 10 beat run on event monitor 09/2020   PFO (patent foramen ovale)    Small PFO noted on coronary CTA   Pneumonia    PONV (postoperative nausea and vomiting)    PTSD (post-traumatic stress disorder)    Renal calculi    Restless legs syndrome (RLS)    Thoracic radiculopathy    Vitamin D deficiency    Wears glasses    BP (!) 157/84   Pulse (!) 107   Temp (!) 100.9 F (38.3 C)   Ht 5\' 5"  (1.651 m)   Wt 152 lb 4.8 oz (69.1 kg)   BMI 25.34 kg/m   Opioid Risk Score:   Fall Risk Score:  `1  Depression screen  PHQ 2/9     11/17/2022    1:08 PM 10/18/2022   10:32 AM 08/15/2022    1:04 PM 04/20/2022   11:36 AM 03/23/2022    2:16 PM 01/04/2022    9:20 AM 11/23/2021    1:05 PM  Depression screen PHQ 2/9  Decreased Interest 0 0 1 1 1  0 1  Down, Depressed, Hopeless 0 1 1 1 1  0 1  PHQ - 2 Score 0 1 2 2 2  0 2     Review of Systems  Musculoskeletal:  Positive for joint swelling and neck pain.       Left wrist and arm pain Left elbow pain Left shoulder pain  All other systems reviewed and are negative.     Objective:   Physical Exam Vitals and nursing note reviewed.  Musculoskeletal:     Comments: No Physical Exam Performed: Video Visit          Assessment & Plan:   History of fibromyalgia with myofascial pain and multiple trigger points.Continue with Heat and exercise Regime. Continue with current medication regimen with  gabapentin. 03/14/2023 2 Chronic migraine headaches.Continue to Monitor. S/P Botox.on 05/30/2017. 03/14/2023 3. Midline Low Back Pain/ Lumbar Spondylosis/Lumbar degenerative disk disease, L4-5/ Lumbar Radiculitis: Continue with HEP and current medication regimen. Continue Hydrocodone 5/325 mg one tablet every 8 hours as needed #80. Marland KitchenContinue with slow weaning of Hydrocodone.  03/14/2023 4. 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 Urology Following. 03/14/2023. 5. Right CTS: Continue to  wear Wrist stabilizer. 03/14/2023 6. Cervicalgia/ Cervical RadiculitisCervical Spondylosis:  Continue current medication regiment with  Gabapentin. Continue  with HEP and Continue to monitor. 03/14/2023 7. Muscle Spasm: Myofascial Pain: She is svcheduled for Trigger Point Injection with Dr Riley Kill : Continue current medication regimen with Flexeril as needed. 03/14/2023 8. Bilateral Greater Trochanter Bursitis: No complaints today. Continue to alternate with ice and heat therapy. Continue HEP as Tolerated. Continue to monitor. 03/14/2023 9. Bilateral Knee Pain: S/P  Knee Injection by Dr Lajoyce Corners on 02/23/2020. Orthopedics Following. Continue to Monitor. 03/14/2023  10. Left Groin Pain: No complaints today.Ms. Hitch reports Oncology Following. Continue to Monitor. 03/14/2023.  11. Fall at Home: No Falls this month. Educated on falls prevention, she verbalizes understanding. Continue to Monitor. 03/14/2023  12. Lollie Sails Tenosynovitis: Ms Mauceri reports she has a scheduled appointment with Ortho on Friday. We will Continue to Monitor. 03/14/2023  12. Left Lateral Epicondylitis : Ms. Pritts reports she has a scheduled appointment with Ortho on Friday. Continue to Monitor  F/U in 1 month   Virtual Video Visit Established Patient Location of Patient: In her Home Location of Provider: In the Office    Video Connection was lost 100% of the duration visit, visit was completed via audio only.

## 2023-03-16 ENCOUNTER — Ambulatory Visit: Payer: Medicare Other | Admitting: Family

## 2023-03-21 ENCOUNTER — Ambulatory Visit: Payer: Medicare Other | Admitting: Family

## 2023-03-28 ENCOUNTER — Other Ambulatory Visit (INDEPENDENT_AMBULATORY_CARE_PROVIDER_SITE_OTHER): Payer: Medicare Other

## 2023-03-28 ENCOUNTER — Ambulatory Visit (INDEPENDENT_AMBULATORY_CARE_PROVIDER_SITE_OTHER): Payer: Medicare Other | Admitting: Family

## 2023-03-28 DIAGNOSIS — M654 Radial styloid tenosynovitis [de Quervain]: Secondary | ICD-10-CM | POA: Diagnosis not present

## 2023-03-28 DIAGNOSIS — M25532 Pain in left wrist: Secondary | ICD-10-CM | POA: Diagnosis not present

## 2023-03-28 DIAGNOSIS — M79642 Pain in left hand: Secondary | ICD-10-CM | POA: Diagnosis not present

## 2023-03-28 NOTE — Progress Notes (Signed)
Office Visit Note   Patient: Patty Salas           Date of Birth: 1956-07-07           MRN: [id number] Visit Date: 03/28/2023              Requested by: Carlean Jews, NP [address] [place],  [place] [address] PCP: Carlean Jews, NP  Chief Complaint  Patient presents with   Left Elbow - Pain   Left Hand - Pain   Left Wrist - Pain      HPI: The patient is a 67 year old woman who presents today complaining of primarily left wrist and thumb pain.  She states this pain has been present since about May of this year there is no associated injury she does have some swelling which she has been treating with ice.  States waxing and whitening redness and warmth associated with the base of the thumb and wrist area she does have a history of gout however she is currently not taking allopurinol or any uric acid lowering medication.  Today denies any elbow pain  Does have a history of de Quervain's tenosynovitis.  States this feels similar to previous episodes has not had in several years.  She currently does not have a splint  Right-hand-dominant  Assessment & Plan: Visit Diagnoses:  1. De Quervain's tenosynovitis, left   2. Pain in left wrist   3. Pain in left hand     Plan: Depo-Medrol injection for her left de Quervain's tenosynovitis.  Given an order for a thumb spica splint on the left.  Patient would like to pick this up herself at a medical supply store.  Discussed some home exercises.  She will follow-up as needed.  Follow-Up Instructions: No follow-ups on file.   Left Hand Exam   Tenderness  The patient is experiencing tenderness in the radial area.   Range of Motion  The patient has normal left wrist ROM.  Muscle Strength  Grip:  4/5   Tests  Finkelstein's test: positive  Other  Erythema: absent Sensation: normal Pulse: present      Patient is alert, oriented, no adenopathy, well-dressed, normal affect, normal respiratory  effort.   Imaging: No results found. No images are attached to the encounter.  Labs: Lab Results  Component Value Date   HGBA1C 5.7 (H) 08/28/2017   LABURIC 6.3 03/07/2021     Lab Results  Component Value Date   ALBUMIN 4.4 03/30/2022   ALBUMIN 3.8 06/13/2021   ALBUMIN 4.2 03/07/2021    Lab Results  Component Value Date   MG 1.7 04/23/2021   MG 2.1 08/28/2017   Lab Results  Component Value Date   VD25OH 33.38 03/30/2022   VD25OH 19.91 (L) 03/07/2021   VD25OH 22.5 (L) 03/24/2020    No results found for: "PREALBUMIN"    Latest Ref Rng & Units 03/30/2022    8:36 AM 06/13/2021    1:48 PM 04/23/2021    3:44 AM  CBC EXTENDED  WBC 4.0 - 10.5 K/uL 5.4  6.7  9.7   RBC 3.87 - 5.11 MIL/uL 3.89  3.83  3.22   Hemoglobin 12.0 - 15.0 g/dL 19.1  47.8  29.5   HCT 36.0 - 46.0 % 37.2  37.8  31.7   Platelets 150 - 400 K/uL 282  295  245   NEUT# 1.7 - 7.7 K/uL 3.4     Lymph# 0.7 - 4.0 K/uL 1.2  There is no height or weight on file to calculate BMI.  Orders:  Orders Placed This Encounter  Procedures   XR Wrist 2 Views Left   XR Elbow 2 Views Left   No orders of the defined types were placed in this encounter.    Procedures: Hand/UE Inj: L extensor compartment 1 for de Quervain's tenosynovitis on 03/28/2023 1:32 PM Indications: therapeutic and diagnostic Details: 22 G needle Medications: 1 mL lidocaine 1 %; 40 mg methylPREDNISolone acetate 40 MG/ML Outcome: tolerated well, no immediate complications Procedure, treatment alternatives, risks and benefits explained, specific risks discussed. Consent was given by the patient. Immediately prior to procedure a time out was called to verify the correct patient, procedure, equipment, support staff and site/side marked as required. Patient was prepped and draped in the usual sterile fashion.     Clinical Data: No additional findings.  ROS:  All other systems negative, except as noted in the HPI. Review of  Systems  Objective: Vital Signs: There were no vitals taken for this visit.  Specialty Comments:  No specialty comments available.  PMFS History: Patient Active Problem List   Diagnosis Date Noted   Statin myopathy [G72.0, T46.6X5A] 12/24/2022   Acute non-recurrent pansinusitis 10/05/2021   Encounter for Medicare annual wellness exam 09/06/2021   CAD (coronary artery disease), native artery transplanted heart 08/16/2021   S/P repair of ventral hernia 04/22/2021   Abdominal pain 03/07/2021   PAT (paroxysmal atrial tachycardia)    Renal cell cancer (HCC) 11/21/2018   Vitamin D deficiency 08/28/2017   Primary hypertension 08/28/2017   Herpes simplex- oral lesions 08/10/2017   Bipolar 1 disorder, mixed, moderate (HCC) 08/10/2017   History of anaphylaxis 08/10/2017   Postmenopausal syndrome 08/10/2017   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   History of kidney cancer 02/22/2016   Renal mass 12/29/2015   Left lateral epicondylitis 09/06/2015   De Quervain's tenosynovitis, right 04/24/2014   Enlarged lymph node 02/12/2014   BPPV (benign paroxysmal positional vertigo) 12/05/2013   Cervical spondylosis without myelopathy 09/17/2013   Post concussion syndrome 09/17/2013   Nephrolithiasis 02/11/2013   Carpal tunnel syndrome 01/01/2013   Wrist tendonitis 01/10/2012   Fibromyalgia/myofascial pain syndrome 12/06/2011   Lumbar spondylosis 12/06/2011   Lumbar degenerative disc disease 12/06/2011   Depression with anxiety 12/06/2011   Migraine aura, persistent, intractable 12/06/2011   Past Medical History:  Diagnosis Date   Anxiety    Arthritis    Asthma    Bipolar 2 disorder (HCC)    pt stated, "I have Bipolar 2 and it is remission"   Bipolar affective (HCC)    CAD (coronary artery disease), native artery transplanted heart    Nonobstructive by coronary CTA 08/2021 with less than 25% stenosis in the LAD, RCA and left circumflex.   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   Full dentures    Gout    Headache(784.0)    migraines   Herpesviral infection    Hypertension    IBS (irritable bowel syndrome)    with diarrhea   Lymphadenopathy    Myalgia and myositis, unspecified    PAT (paroxysmal atrial tachycardia)    asymptomatic 10 beat  run on event monitor 09/2020   PFO (patent foramen ovale)    Small PFO noted on coronary CTA   Pneumonia    PONV (postoperative nausea and vomiting)    PTSD (post-traumatic stress disorder)    Renal calculi    Restless legs syndrome (RLS)    Thoracic radiculopathy    Vitamin D deficiency    Wears glasses     Family History  Problem Relation Age of Onset   Cancer Mother        stomach   Migraines Mother    Arthritis Mother    Emphysema Mother    Heart disease Father    Hypertension Father    Cancer Father        colon   Dementia Father    Hypertension Brother    Migraines Brother    Hypertension Brother    Migraines Brother    Alcohol abuse Brother    Bipolar disorder Brother    Migraines Daughter    Arthritis Maternal Grandmother    Cancer Maternal Grandmother        stomach   Diabetes Maternal Grandmother    Stroke Maternal Grandmother    Cancer Maternal Aunt        lung   Emphysema Maternal Aunt    Cancer Paternal Uncle        colon   Hypertension Maternal Aunt    Heart disease Maternal Aunt    Cancer Paternal Uncle        lung    Past Surgical History:  Procedure Laterality Date    kidney stones     ABDOMINAL ADHESION SURGERY     ABDOMINAL HYSTERECTOMY  1995   partial- hemmoraged after surgery-stitch came loose   APPENDECTOMY  1993   hemmoraged after surgery- stitch came loose   CERVICAL DISC SURGERY  06/04/2001   with  fusion   CHOLECYSTECTOMY  1985   colonoscopy     COLONOSCOPY     CYSTOSCOPY     FOOT SURGERY     lt   INCISIONAL HERNIA REPAIR N/A 04/22/2021   Procedure: LAPAROSCOPIC INCISIONAL HERNIA REPAIR WITH MESH;  Surgeon: Berna Bue, MD;  Location: [facility];  Service: General;  Laterality: N/A;   jj stent  07/2002   with ureteroscopy, cystoscopy and then removal of stent in office   LAPAROSCOPIC LYSIS OF ADHESIONS N/A 04/22/2021   Procedure: LYSIS OF ADHESIONS;  Surgeon: Berna Bue, MD;  Location: [facility];  Service: General;  Laterality: N/A;   LITHOTRIPSY     LYMPH NODE BIOPSY Right 03/06/2014   Procedure: right groin LYMPH NODE BIOPSY;  Surgeon: Robyne Askew, MD;  Location: [facility];  Service: General;  Laterality: Right;   LYMPHADENECTOMY N/A 12/29/2015   Procedure: RETROPERITONEAL LYMPHADENECTOMY;  Surgeon: Sebastian Ache, MD;  Location: [facility];  Service: Urology;  Laterality: N/A;   NECK SURGERY  2002   ROBOT ASSISTED LAPAROSCOPIC NEPHRECTOMY Left 12/29/2015   Procedure: XI ROBOTIC ASSISTED LEFT LAPAROSCOPIC NEPHRECTOMY;  Surgeon: Sebastian Ache, MD;  Location: [facility];  Service: Urology;  Laterality: Left;   TONSILLECTOMY  1976   Social History   Occupational History   Occupation: HOUSEWIFE    Associate Professor: UNEMPLOYED  Tobacco Use   Smoking status: Never   Smokeless tobacco: Never  Vaping Use   Vaping Use: Never used  Substance and Sexual Activity   Alcohol use: No   Drug use: No   Sexual activity: Not on file

## 2023-03-29 ENCOUNTER — Telehealth: Payer: Self-pay

## 2023-03-29 ENCOUNTER — Other Ambulatory Visit: Payer: Self-pay | Admitting: Nurse Practitioner

## 2023-03-29 DIAGNOSIS — R062 Wheezing: Secondary | ICD-10-CM

## 2023-03-29 NOTE — Telephone Encounter (Signed)
Returned her call. Reviewed her upcoming appts. She ask if she needs to be NPO for labs, told her she does not need to be NPO. She verbalized understanding.

## 2023-03-30 ENCOUNTER — Encounter: Payer: Self-pay | Admitting: Family

## 2023-03-30 MED ORDER — METHYLPREDNISOLONE ACETATE 40 MG/ML IJ SUSP
40.0000 mg | INTRAMUSCULAR | Status: AC | PRN
Start: 2023-03-28 — End: 2023-03-28
  Administered 2023-03-28: 40 mg

## 2023-03-30 MED ORDER — LIDOCAINE HCL 1 % IJ SOLN
1.0000 mL | INTRAMUSCULAR | Status: AC | PRN
Start: 2023-03-28 — End: 2023-03-28
  Administered 2023-03-28: 1 mL

## 2023-04-02 ENCOUNTER — Other Ambulatory Visit: Payer: Medicare Other

## 2023-04-02 ENCOUNTER — Telehealth: Payer: Self-pay | Admitting: Hematology and Oncology

## 2023-04-02 ENCOUNTER — Telehealth: Payer: Self-pay

## 2023-04-02 ENCOUNTER — Ambulatory Visit: Payer: Medicare Other | Admitting: Hematology and Oncology

## 2023-04-02 NOTE — Telephone Encounter (Signed)
Patient has been informed [name] is not in the office:  On 03/28/2023 [name] received Prednisone 40 MG to the left hand/wrist by Dr. Vanessa Barbara. She wanted to know if she will be able to receive the Trigger Points on 04/04/2023?  Call back phone [phone].

## 2023-04-04 ENCOUNTER — Encounter: Payer: Medicare Other | Admitting: Physical Medicine & Rehabilitation

## 2023-04-09 ENCOUNTER — Encounter: Payer: Medicare Other | Attending: Physical Medicine & Rehabilitation | Admitting: Registered Nurse

## 2023-04-09 ENCOUNTER — Telehealth: Payer: Self-pay | Admitting: Neurology

## 2023-04-09 VITALS — BP 149/76 | HR 103 | Ht 65.0 in | Wt 153.6 lb

## 2023-04-09 DIAGNOSIS — M797 Fibromyalgia: Secondary | ICD-10-CM | POA: Diagnosis present

## 2023-04-09 DIAGNOSIS — G894 Chronic pain syndrome: Secondary | ICD-10-CM | POA: Diagnosis present

## 2023-04-09 DIAGNOSIS — M542 Cervicalgia: Secondary | ICD-10-CM

## 2023-04-09 DIAGNOSIS — M5416 Radiculopathy, lumbar region: Secondary | ICD-10-CM

## 2023-04-09 DIAGNOSIS — M5412 Radiculopathy, cervical region: Secondary | ICD-10-CM | POA: Diagnosis present

## 2023-04-09 DIAGNOSIS — Z5181 Encounter for therapeutic drug level monitoring: Secondary | ICD-10-CM

## 2023-04-09 DIAGNOSIS — G8929 Other chronic pain: Secondary | ICD-10-CM | POA: Insufficient documentation

## 2023-04-09 DIAGNOSIS — M546 Pain in thoracic spine: Secondary | ICD-10-CM | POA: Diagnosis present

## 2023-04-09 DIAGNOSIS — Z79891 Long term (current) use of opiate analgesic: Secondary | ICD-10-CM | POA: Diagnosis present

## 2023-04-09 DIAGNOSIS — M47812 Spondylosis without myelopathy or radiculopathy, cervical region: Secondary | ICD-10-CM | POA: Diagnosis present

## 2023-04-09 MED ORDER — HYDROCODONE-ACETAMINOPHEN 5-325 MG PO TABS
1.0000 | ORAL_TABLET | Freq: Three times a day (TID) | ORAL | 0 refills | Status: DC | PRN
Start: 2023-04-09 — End: 2023-04-09

## 2023-04-09 MED ORDER — HYDROCODONE-ACETAMINOPHEN 5-325 MG PO TABS
1.0000 | ORAL_TABLET | Freq: Three times a day (TID) | ORAL | 0 refills | Status: DC | PRN
Start: 2023-04-09 — End: 2023-06-12

## 2023-04-09 NOTE — Telephone Encounter (Signed)
Pt said she needs PA for promethazine (PHENERGAN) 25 MG tablet.

## 2023-04-09 NOTE — Telephone Encounter (Signed)
Pa requested Routing to rx prior auth team

## 2023-04-09 NOTE — Progress Notes (Signed)
Subjective:    Patient ID: Patty Salas, female    DOB: 01-Jun-1956, 67 y.o.   MRN: [id number]  HPI: Patty Salas is a 67 y.o. female who returns for follow up appointment for chronic pain and medication refill.She states her pain is located in her neck radiating into her bilateral shoulders, mid- lower back pain radiating into her bilateral hips and bilateral lower extremities. She rates her pain 10. Her current exercise regime is walking and performing stretching exercises.  [name] Morphine equivalent is 15.00 MME. She is also prescribed Lorazepam by Dr. Betti Cruz .We have discussed the black box warning of using opioids and benzodiazepines. I highlighted the dangers of using these drugs together and discussed the adverse events including respiratory suppression, overdose, cognitive impairment and importance of compliance with current regimen. We will continue to monitor and adjust as indicated.  she is being closely monitored and under the care of her psychiatrist Dr Betti Cruz     UDS Ordered today.    Pain Inventory Average Pain 9 Pain Right Now 10 My pain is constant, sharp, burning, stabbing, tingling, and aching  In the last 24 hours, has pain interfered with the following? General activity 8 Relation with others 9 Enjoyment of life 8 What TIME of day is your pain at its worst? morning , daytime, evening, and night Sleep (in general) Fair  Pain is worse with: walking, bending, inactivity, standing, and some activites Pain improves with: rest, heat/ice, therapy/exercise, medication, and injections Relief from Meds: 5  Family History  Problem Relation Age of Onset   Cancer Mother        stomach   Migraines Mother    Arthritis Mother    Emphysema Mother    Heart disease Father    Hypertension Father    Cancer Father        colon   Dementia Father    Hypertension Brother    Migraines Brother    Hypertension Brother    Migraines Brother    Alcohol abuse Brother     Bipolar disorder Brother    Migraines Daughter    Arthritis Maternal Grandmother    Cancer Maternal Grandmother        stomach   Diabetes Maternal Grandmother    Stroke Maternal Grandmother    Cancer Maternal Aunt        lung   Emphysema Maternal Aunt    Cancer Paternal Uncle        colon   Hypertension Maternal Aunt    Heart disease Maternal Aunt    Cancer Paternal Uncle        lung   Social History   Socioeconomic History   Marital status: Married    Spouse name: Not on file   Number of children: 1   Years of education: COLLEGE1   Highest education level: Not on file  Occupational History   Occupation: HOUSEWIFE    Employer: UNEMPLOYED  Tobacco Use   Smoking status: Never   Smokeless tobacco: Never  Vaping Use   Vaping Use: Never used  Substance and Sexual Activity   Alcohol use: No   Drug use: No   Sexual activity: Not on file  Other Topics Concern   Not on file  Social History Narrative   Patient is right handed.   Patient drinks caffeine occasionally.   Social Determinants of Health   Financial Resource Strain: Not on file  Food Insecurity: No Food Insecurity (10/18/2022)   Hunger Vital Sign  Worried About Programme researcher, broadcasting/film/video in the Last Year: Never true    Ran Out of Food in the Last Year: Never true  Transportation Needs: Not on file  Physical Activity: Not on file  Stress: Not on file  Social Connections: Not on file   Past Surgical History:  Procedure Laterality Date    kidney stones     ABDOMINAL ADHESION SURGERY     ABDOMINAL HYSTERECTOMY  1995   partial- hemmoraged after surgery-stitch came loose   APPENDECTOMY  1993   hemmoraged after surgery- stitch came loose   CERVICAL DISC SURGERY  06/04/2001   with fusion   CHOLECYSTECTOMY  1985   colonoscopy     COLONOSCOPY     CYSTOSCOPY     FOOT SURGERY     lt   INCISIONAL HERNIA REPAIR N/A 04/22/2021   Procedure: LAPAROSCOPIC INCISIONAL HERNIA REPAIR WITH MESH;  Surgeon: Berna Bue,  MD;  Location: [facility];  Service: General;  Laterality: N/A;   jj stent  07/2002   with ureteroscopy, cystoscopy and then removal of stent in office   LAPAROSCOPIC LYSIS OF ADHESIONS N/A 04/22/2021   Procedure: LYSIS OF ADHESIONS;  Surgeon: Berna Bue, MD;  Location: [facility];  Service: General;  Laterality: N/A;   LITHOTRIPSY     LYMPH NODE BIOPSY Right 03/06/2014   Procedure: right groin LYMPH NODE BIOPSY;  Surgeon: Robyne Askew, MD;  Location:  [facility]ERY [facility];  Service: General;  Laterality: Right;   LYMPHADENECTOMY N/A 12/29/2015   Procedure: RETROPERITONEAL LYMPHADENECTOMY;  Surgeon: Sebastian Ache, MD;  Location: [facility];  Service: Urology;  Laterality: N/A;   NECK SURGERY  2002   ROBOT ASSISTED LAPAROSCOPIC NEPHRECTOMY Left 12/29/2015   Procedure: XI ROBOTIC ASSISTED LEFT LAPAROSCOPIC NEPHRECTOMY;  Surgeon: Sebastian Ache, MD;  Location: [facility];  Service: Urology;  Laterality: Left;   TONSILLECTOMY  1976   Past Surgical History:  Procedure Laterality Date    kidney stones     ABDOMINAL ADHESION SURGERY     ABDOMINAL HYSTERECTOMY  1995   partial- hemmoraged after surgery-stitch came loose   APPENDECTOMY  1993   hemmoraged after surgery- stitch came loose   CERVICAL DISC SURGERY  06/04/2001   with fusion   CHOLECYSTECTOMY  1985   colonoscopy     COLONOSCOPY     CYSTOSCOPY     FOOT SURGERY     lt   INCISIONAL HERNIA REPAIR N/A 04/22/2021   Procedure: LAPAROSCOPIC INCISIONAL HERNIA REPAIR WITH MESH;  Surgeon: Berna Bue, MD;  Location: [facility];  Service: General;  Laterality: N/A;   jj stent  07/2002   with ureteroscopy, cystoscopy and then removal of stent in office   LAPAROSCOPIC LYSIS OF ADHESIONS N/A 04/22/2021   Procedure: LYSIS OF ADHESIONS;  Surgeon: Berna Bue, MD;  Location: [facility];  Service: General;  Laterality: N/A;   LITHOTRIPSY     LYMPH NODE BIOPSY Right 03/06/2014   Procedure: right groin LYMPH NODE BIOPSY;  Surgeon: Robyne Askew, MD;   Location:  SUR[facility]ERY [facility];  Service: General;  Laterality: Right;   LYMPHADENECTOMY N/A 12/29/2015   Procedure: RETROPERITONEAL LYMPHADENECTOMY;  Surgeon: Sebastian Ache, MD;  Location: [facility];  Service: Urology;  Laterality: N/A;   NECK SURGERY  2002   ROBOT ASSISTED LAPAROSCOPIC NEPHRECTOMY Left 12/29/2015   Procedure: XI ROBOTIC ASSISTED LEFT LAPAROSCOPIC NEPHRECTOMY;  Surgeon: Sebastian Ache, MD;  Location: [facility];  Service: Urology;  Laterality: Left;  TONSILLECTOMY  1976   Past Medical History:  Diagnosis Date   Anxiety    Arthritis    Asthma    Bipolar 2 disorder (HCC)    pt stated, "I have Bipolar 2 and it is remission"   Bipolar affective (HCC)    CAD (coronary artery disease), native artery transplanted heart    Nonobstructive by coronary CTA 08/2021 with less than 25% stenosis in the LAD, RCA and left circumflex.   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   Full dentures    Gout    Headache(784.0)    migraines   Herpesviral infection    Hypertension    IBS (irritable bowel syndrome)    with diarrhea   Lymphadenopathy    Myalgia and myositis, unspecified    PAT (paroxysmal atrial tachycardia)    asymptomatic 10 beat run on event monitor 09/2020   PFO (patent foramen ovale)    Small PFO noted on coronary CTA   Pneumonia    PONV (postoperative nausea and vomiting)    PTSD (post-traumatic stress disorder)    Renal calculi    Restless legs syndrome (RLS)    Thoracic radiculopathy    Vitamin D deficiency    Wears glasses    BP (!) 149/76   Pulse (!) 103   Ht 5\' 5"  (1.651 m)   Wt 153 lb 9.6 oz (69.7 kg)   SpO2 94%   BMI 25.56 kg/m   Opioid Risk Score:   Fall Risk Score:  `1  Depression screen PHQ 2/9      11/17/2022    1:08 PM 10/18/2022   10:32 AM 08/15/2022    1:04 PM 04/20/2022   11:36 AM 03/23/2022    2:16 PM 01/04/2022    9:20 AM 11/23/2021    1:05 PM  Depression screen PHQ 2/9  Decreased Interest 0 0 1 1 1  0 1  Down, Depressed, Hopeless 0 1 1 1 1  0 1  PHQ - 2 Score 0 1 2 2 2  0 2      Review of Systems  Musculoskeletal:  Positive for myalgias.  All other systems reviewed and are negative.     Objective:   Physical Exam Vitals and nursing note reviewed.  Constitutional:      Appearance: Normal appearance.  Neck:     Comments: Cervical Paraspinal Tenderness: C-5-C-6 Cardiovascular:     Rate and Rhythm: Normal rate and regular rhythm.     Pulses: Normal pulses.     Heart sounds: Normal heart sounds.  Pulmonary:     Effort: Pulmonary effort is normal.     Breath sounds: Normal breath sounds.  Musculoskeletal:     Cervical back: Normal range of motion and neck supple.     Comments: Normal Muscle Bulk and Muscle Testing Reveals:  Upper Extremities: Full ROM and Muscle Strength 5/5 Bilateral AC Joint Tenderness Thoracic Paraspinal Tenderness: T-7-T-9 Lumbar Paraspinal Tenderness: L-3-L-5 Bilateral Greater Trochanter Tenderness Lower Extremities: Full ROM and Muscle Strength 5/5 Arises from Table with ease Narrow Based  Gait     Skin:    General: Skin is warm and dry.  Neurological:     Mental Status: She is alert and oriented  to person, place, and time.  Psychiatric:        Mood and Affect: Mood normal.        Behavior: Behavior normal.         Assessment & Plan:  History of fibromyalgia with myofascial pain and multiple trigger points.Continue with Heat and exercise Regime. Continue with current medication regimen with  gabapentin. 04/09/2023 2 Chronic migraine headaches.Continue to Monitor. S/P Botox.on 05/30/2017. 04/09/2023 3. Midline Low Back Pain/ Lumbar Spondylosis/Lumbar degenerative disk disease, L4-5/ Lumbar Radiculitis: Continue with HEP and current  medication regimen. Continue Hydrocodone 5/325 mg one tablet every 8 hours as needed #80. Marland KitchenContinue with slow weaning of Hydrocodone.  04/09/2023 4. 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 Urology Following. 04/09/2023. 5. 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 04/09/2023 6. Cervicalgia/ Cervical RadiculitisCervical Spondylosis:  Continue current medication regiment with  Gabapentin. Continue  with HEP and Continue to monitor. 04/09/2023 7. Muscle Spasm: Myofascial Pain: She is svcheduled for Trigger Point Injection with Dr Riley Kill : Continue current medication regimen with Flexeril as needed. 04/09/2023 8. Bilateral Greater Trochanter Bursitis: No complaints today. Continue to alternate with ice and heat therapy. Continue HEP as Tolerated. Continue to monitor. 04/09/2023 9. Bilateral Knee Pain: S/P Knee Injection by Dr Lajoyce Corners on 02/23/2020. Orthopedics Following. Continue to Monitor. 04/09/2023  10. Left Groin Pain: No complaints today.Ms. Calloway reports Oncology Following. Continue to Monitor. 04/09/2023.  11. Fall at Home: No Falls this month. Educated on falls prevention, she verbalizes understanding. Continue to Monitor. 04/09/2023  12. Lollie Sails Tenosynovitis: Ms [place] reports she has a scheduled appointment with Ortho on Friday. We will Continue to Monitor. 04/09/2023  12. Left Lateral Epicondylitis : Ms. Mireles reports she has a scheduled appointment with Ortho on Friday. Continue to Monitor   F/U in 1 month

## 2023-04-10 ENCOUNTER — Ambulatory Visit
Admission: RE | Admit: 2023-04-10 | Discharge: 2023-04-10 | Disposition: A | Discharge status: Home / Self Care | Payer: Medicare Other | Source: Ambulatory Visit | Attending: Physical Medicine & Rehabilitation | Admitting: Physical Medicine & Rehabilitation

## 2023-04-10 DIAGNOSIS — M5412 Radiculopathy, cervical region: Secondary | ICD-10-CM

## 2023-04-10 NOTE — Telephone Encounter (Signed)
Pt called wanting to know if she can receive a call once this medication is called in for her.

## 2023-04-11 LAB — TOXASSURE SELECT,+ANTIDEPR,UR

## 2023-04-12 ENCOUNTER — Telehealth: Payer: Self-pay

## 2023-04-12 ENCOUNTER — Other Ambulatory Visit (HOSPITAL_COMMUNITY): Payer: Self-pay

## 2023-04-12 ENCOUNTER — Other Ambulatory Visit: Payer: Self-pay | Admitting: Neurology

## 2023-04-12 NOTE — Telephone Encounter (Signed)
No PA needed-please see other telephone note concerning this medication dated 04/12/2023.

## 2023-04-12 NOTE — Telephone Encounter (Signed)
Routing to the rx prior auth team to see if we have and update on the pa?

## 2023-04-12 NOTE — Telephone Encounter (Deleted)
     Per Test claim and CMM-No PA Needed. 15 tabs are $17.58

## 2023-04-12 NOTE — Telephone Encounter (Signed)
   No PA Needed- $17.58 for 15 Tabs/30 DS CMM also states no PA needed.

## 2023-04-12 NOTE — Telephone Encounter (Signed)
Pt called wanting to know the update on the PA for this medication. Please advise.

## 2023-04-17 ENCOUNTER — Telehealth: Payer: Self-pay | Admitting: *Deleted

## 2023-04-17 NOTE — Telephone Encounter (Signed)
Patient calling requesting a copy of MRI performed on 04/10/23. Patient notified report has not been read by radiologist yet.

## 2023-04-18 ENCOUNTER — Ambulatory Visit: Payer: Medicare Other | Admitting: Physician Assistant

## 2023-04-18 ENCOUNTER — Encounter: Payer: Self-pay | Admitting: Registered Nurse

## 2023-04-18 NOTE — Progress Notes (Deleted)
  Cardiology Office Note:  .   Date:  04/18/2023  ID:  Patty Salas, DOB [birth year], MRN [id number] PCP: Carlean Jews, NP  [facility] Providers Cardiologist:  None {  History of Present Illness: .   Patty Salas is a 67 y.o. female patient of Dr. Mayford Knife who was seen for LE edema back in Aug of 2022. History includes asthma, PAT, bipolar disease, depression, PTSD, thoracic radiculopathy.   She had surgery 04/22/21 by Dr Fredricka Bonine laparoscopic repair of incarcerated incisional hernia with mesh and lysis of adhesions Since then had weight gain and LE edema Improved with lasix her husband had She also has a history of follicular lymphoma intrapelvic and renal cell carcinoma with previous left nephrectomy NM PET 03/23/21 mild disease progression in chest and minimal decrease in nodal size in abdomen    TTE 10/07/20 EF 60-65% normal RV only diastolic relaxation abnormality no significant valve disease or signs  Of elevated PA pressure  At her last appointment she was doing well from a CV standpoint. She has LE edema but this is due to pelvic lymphoma and nothing to do with her heart.   Today, she ***  ROS: ***  Studies Reviewed: .        *** Risk Assessment/Calculations:   {Does this patient have ATRIAL FIBRILLATION?[phone]} No BP recorded.  {Refresh Note OR Click here to enter BP  :1}***       Physical Exam:   VS:  There were no vitals taken for this visit.   Wt Readings from Last 3 Encounters:  04/09/23 153 lb 9.6 oz (69.7 kg)  03/14/23 152 lb 4.8 oz (69.1 kg)  01/31/23 150 lb (68 kg)    GEN: Well nourished, well developed in no acute distress NECK: No JVD; No carotid bruits CARDIAC: ***RRR, no murmurs, rubs, gallops RESPIRATORY:  Clear to auscultation without rales, wheezing or rhonchi  ABDOMEN: Soft, non-tender, non-distended EXTREMITIES:  No edema; No deformity   ASSESSMENT AND PLAN: .   HTN PAT LE edema    {Are you ordering a CV Procedure (e.g. stress  test, cath, DCCV, TEE, etc)?   Press F2        :[id number]}  Dispo: ***  Signed, Sharlene Dory, PA-C

## 2023-04-20 ENCOUNTER — Ambulatory Visit: Payer: Medicare Other | Admitting: Family

## 2023-04-24 ENCOUNTER — Inpatient Hospital Stay: Payer: Medicare Other

## 2023-04-24 ENCOUNTER — Inpatient Hospital Stay: Payer: Medicare Other | Admitting: Hematology and Oncology

## 2023-04-25 ENCOUNTER — Ambulatory Visit: Payer: Medicare Other | Admitting: Family

## 2023-04-26 ENCOUNTER — Telehealth: Payer: Self-pay | Admitting: Neurology

## 2023-04-26 NOTE — Telephone Encounter (Signed)
This is FYI, no call back requested.  Pt wants Sarah, NP to be aware that to the best of her ability she will complete the paperwork sent to her. Pt may arrive extra early to make an attempt to complete all of paperwork.

## 2023-04-30 ENCOUNTER — Ambulatory Visit: Payer: Medicare Other | Admitting: Neurology

## 2023-04-30 NOTE — Telephone Encounter (Addendum)
Pt cancelled appt due to being diagnosed with De quervain's Tenosynovitis, having diarrhea and chills.

## 2023-04-30 NOTE — Progress Notes (Deleted)
Patient: Patty Salas Date of Birth: 11-09-1955  Reason for Visit: Follow up History from: Patient Primary Neurologist:    ASSESSMENT AND PLAN 67 y.o. year old female   1.  Chronic migraine headache   HISTORY OF PRESENT ILLNESS: Today 04/30/23  HISTORY  Today July 12, 2022 SS: Patty Salas is here today for follow up for virtual visit. She wanted to be seen in office but had flu like symptoms. Yesterday BP was 160/104, HR 108. Is off fentanyl, is planning to wean off Norco, Lorazepam. Going to pain clinic. Has not tolerated Botox in the past, made her head swell up. Remains under a lot of stress. Gets trigger point injections basically all over with [name]. Takes Axert for migraine headaches, works great for her, pays cash. Takes all her monthly supply.She cannot afford CGRP injection. Has 10.5 tablets of phenergan left since last refill. Claims trying to be on less medications.   11/10/2020 SS: Patty Salas is a 67 year old female with history of intractable migraine headache.  She has previously not tolerated Botox.  Has not been able to afford CGRP, any of them. She is allergic to 58 medications. 2-3 migraines a week, takes Axert, generic, no more than twice a week. Usually starts with phenergan 25 mg 1/2 tablet for migraine, if not helpful will take Axert.  Is really careful not to get rebound headache. Insurance won't cover, uses good rx coupon gets 24 tablets for 3 month period. Needs refill by 3/20. Sees pain management is on Fentanyl, Norco, Flexeril, Ativan. Is hopeful to one day just be on Flexeril. Often for headaches hot/cold mask works well. Axert works Firefighter. Here today for follow-up via telephone we couldn't connect on my my chart. A lot of stress about her husband's kidney issues.   REVIEW OF SYSTEMS: Out of a complete 14 system review of symptoms, the patient complains only of the following symptoms, and all other reviewed systems are negative.  See  HPI  ALLERGIES: Allergies  Allergen Reactions   Aripiprazole Anaphylaxis    Stiffened muscles, extrapyramidal effects all over body.  Other reaction(s): Other (See Comments) Stiffened muscles Stiffened muscles, extrapyramidal effects all over body.  Stiffened muscles Stiffened muscles, extrapyramidal effects all over body.     Aspirin Hives, Itching and Anaphylaxis   Bromfenac Sodium Anaphylaxis   Chlorpromazine Anaphylaxis    Other reaction(s): Other (See Comments) Stiffened muscles Stiffened muscles    Cymbalta [Duloxetine Hcl] Anaphylaxis    Suicidal if in combination w/ Axert   Divalproex Sodium Anaphylaxis   Duloxetine Anaphylaxis    Suicidal if in combination w/ Axert Suicidal if in combination w/ Axert   Duract [Bromfenac] Anaphylaxis   Flurazepam Anaphylaxis   Hydrochlorothiazide Anaphylaxis    Pt loses facial movement uncontrolled;  Pt loses facial movement uncontrolled;  Pt loses facial movement uncontrolled;    Imitrex [Sumatriptan Succinate] Anaphylaxis   Latex Anaphylaxis   Mellaril Anaphylaxis   Metoclopramide Anaphylaxis and Other (See Comments)    Headaches.  Other reaction(s): Headache, Other (See Comments) headache Headaches.  headache Headaches.   Other reaction(s): headache   Olanzapine Anaphylaxis   Olanzapine Anaphylaxis and Other (See Comments)    Unknown    Other Itching, Other (See Comments), Hives, Nausea And Vomiting, Rash and Anaphylaxis    Dust, mold, feathers, wool, flannel, cologne, chlorox, household cleaning products, scented candles, cinnamon, ivory soap, paints, sun sentitive, acidic fruits, ( lemons, limes, strawbery, tartrazine yellow#5 and 6 in foods, MSG food additives, sudiium lauryl  sulates, hot peppers, spearmint, wintergreen peppermint, mentol, orange, jalapenos. ---headache, breathing, welps, canker sores inside mouth, uticaria Other reaction(s): Other (See Comments) Potassium-containing Compounds-Undiluted through IV.  --Pain.  Dust, mold, feathers, wool, flannel, cologne, chlorox, household cleaning products, scented candles, cinnamon, ivory soap, paints, sun sentitive, acidic fruits, ( lemons, limes, strawbery, tartrazine yellow#5 and 6 in foods, MSG food additives, sudiium lauryl sulates, hot peppers, spearmint, wintergreen peppermint, mentol, orange, jalapenos. ---headache, breathing, welps, canker sores inside mouth, uticaria Hives on back and looked like sun Dust, mold, feathers, wool, flannel, cologne, chlorox, household cleaning products, scented candles, cinnamon, ivory soap, paints, sun sentitive, acidic fruits, ( lemons, limes, strawbery, tartrazine yellow#5 and 6 in foods, MSG food additives, sudiium lauryl sulates, hot peppers, spearmint, wintergreen peppermint, mentol, orange, jalapenos. ---headache, breathing, welps, canker sores inside mouth, uticaria   Penicillins Anaphylaxis   Quetiapine Anaphylaxis    Other reaction(s): Other (See Comments) Bad dreams, too sedating. extreme fatigue and bad dreams extreme fatigue and bad dreams Bad dreams, too sedating.   Statins Hives and Anaphylaxis    Other reaction(s): Other (See Comments) Paranoid affect, muscle spasms.  Paranoid affect, muscle spasms.     Sumatriptan Anaphylaxis and Other (See Comments)   Thioridazine Anaphylaxis   Topamax Anaphylaxis and Other (See Comments)    Confusion, tremor, blurred vision   Topiramate Anaphylaxis    Other reaction(s): Other (See Comments) Confusion, tremor, blurred vision Confusion, tremor, blurred vision    Trifluoperazine Anaphylaxis    Other reaction(s): Other (See Comments) Stiffened muscles Stiffened muscles    Aripiprazole Other (See Comments)    Stiffened muscles   Dalmane [Flurazepam Hcl] Other (See Comments)    Stiffened all muscles   Darifenacin Hydrobromide Er Hives    Hives on back and looked like sunburn   Metoclopramide Hcl Other (See Comments)    headache   Seroquel [Quetiapine  Fumerate] Other (See Comments)    extreme fatigue and bad dreams   Stelazine Other (See Comments)    Stiffened muscles   Thorazine [Chlorpromazine Hcl] Other (See Comments)    Stiffened muscles   Allopurinol Hives and Other (See Comments)   Aspirin Effervescent Other (See Comments)    Mouth ulcers, swallowing problems.  Mouth ulcers, swallowing problems.  Mouth ulcers, swallowing problems.     Barium Sulfate Itching and Other (See Comments)   Benzocaine Other (See Comments)    orthicoat sprays containing menthol or lemon flavor--breaks inside of mouth out, red rash, mouth ulcers.  Other reaction(s): Other (See Comments) orthicoat sprays containing menthol or lemon flavor--breaks inside of mouth out, red rash, mouth ulcers.  orthicoat sprays containing menthol or lemon flavor--breaks inside of mouth out, red rash, mouth ulcers.    Biofreeze [Menthol (Topical Analgesic)] Hives and Itching   Camphor-Menthol     Other reaction(s): Not available   Capsaicin Itching    Redness  Redness Redness    Capsaicin-Menthol Other (See Comments)   Clindamycin Itching and Other (See Comments)   Clindamycin/Lincomycin     Bad reflux and loose stools    Clove Oil Itching and Other (See Comments)   Colchicine Hives and Other (See Comments)   Cycloheximide Itching and Other (See Comments)   Darifenacin Hives   Diatrizoate Itching    welps  Other reaction(s): Other (See Comments) welps  welps  Other reaction(s): Not available   Diflucan [Fluconazole] Hives and Itching   Erythromycin Other (See Comments)    Other reaction(s): Unknown   Garlic Itching and Other (See Comments)  Mouth swelling   Iohexol      Code: HIVES, Desc: pt broke out in red rash and hives after CT injection on 12/07/09.-pt needs 13 hr prep kit, Onset Date: [id number]  Other reaction(s): Other (See Comments)  Code: HIVES, Desc: pt broke out in red rash and hives after CT injection on 12/07/09.-pt needs 13 hr prep kit, Onset  Date: [id number]  Code: HIVES, Desc: pt broke out in red rash and hives after CT injection on 12/07/09.-pt needs 13 hr prep kit, Onset Date: [id number]   Ivp Dye [Iodinated Contrast Media]    Metronidazole Other (See Comments)   Mirabegron     swelling Other reaction(s): Other (See Comments) swelling swelling   Miralax [Polyethylene Glycol] Nausea And Vomiting   Moxifloxacin Itching    Other reaction(s): Not available   Nsaids     Mouth ulcers, swelling of mouth.    Oxybutynin Diarrhea   Polyethylene Glycol 3350 Itching   Potassium Chloride Itching   Potassium-Containing Compounds     Undiluted through IV. --Pain.    Prednisone Itching and Other (See Comments)    No  Sleep at night.  Other reaction(s): Insomnia, Other (See Comments) No  Sleep at night.  No  Sleep at night.   Other reaction(s): insomnia   Psyllium Other (See Comments)    Mouth ulcers, swallowing problems. Other reaction(s): Other (See Comments) Mouth ulcers, swallowing problems. Mouth ulcers, swallowing problems.    Red Dye     Other reaction(s): Unknown   Seroquel [Quetiapine Fumarate]     Bad dreams, too sedating.   Simvastatin Other (See Comments)    Paranoid affect, muscle spasms.    Urocit - K [Potassium Citrate]     Breaks mouth out   Avelox [Moxifloxacin Hcl In Nacl] Nausea And Vomiting   Barium-Containing Compounds Rash   Butalbital-Aspirin-Caffeine Itching, Rash and Other (See Comments)    Welps, Welps, Welps,    Diclofenac Rash and Other (See Comments)   Doxycycline Nausea Only and Other (See Comments)    Stomach upset.  Stomach upset.  Stomach upset.    E-Mycin [Erythromycin Base] Nausea Only   Flagyl [Metronidazole Hcl] Nausea And Vomiting   Lamotrigine Rash and Other (See Comments)   Pregabalin     Blurry vision, muscle stiffness    Pregabalin Other (See Comments)    Other reaction(s): Other (See Comments) Blurry vision, muscle stiffness  Blurry vision, muscle stiffness      Risperidone Other (See Comments)    Too sedating   Risperidone Other (See Comments)    Too sedating.  Other reaction(s): Other (See Comments) Too sedating.  Too sedating Too sedating Too sedating.     Toradol [Ketorolac Tromethamine] Itching and Rash    HOME MEDICATIONS: Outpatient Medications Prior to Visit  Medication Sig Dispense Refill   acetaminophen (TYLENOL) 500 MG tablet Take 500 mg by mouth every 6 (six) hours as needed for moderate pain.     almotriptan (AXERT) 12.5 MG tablet TAKE 1 TABLET BY MOUTH AS NEEDED FOR MIGRAINE (MAX 2 TABS PER WEEK). 24 tablet 1   bisacodyl (DULCOLAX) 5 MG EC tablet Take 10 mg by mouth daily as needed for mild constipation.      cyclobenzaprine (FLEXERIL) 10 MG tablet Take 1 tablet (10 mg total) by mouth 3 (three) times daily. 90 tablet 4   docusate sodium (COLACE) 100 MG capsule Take 100 mg by mouth daily as needed for mild constipation.      EPINEPHRINE 0.3 mg/0.3 mL  IJ SOAJ injection INJECT 0.3 MG INTO THE MUSCLE AS NEEDED FOR ANAPHYLAXIS. 2 each 0   fexofenadine (ALLEGRA) 180 MG tablet Take 1 tablet (180 mg total) by mouth daily. 30 tablet 0   furosemide (LASIX) 20 MG tablet TAKE 1 TABLET (20 MG TOTAL) BY MOUTH DAILY AS NEEDED FOR EDEMA. 90 tablet 1   gabapentin (NEURONTIN) 300 MG capsule Take 2 capsules by mouth at bedtime.     HYDROcodone-acetaminophen (NORCO/VICODIN) 5-325 MG tablet Take 1 tablet by mouth every 8 (eight) hours as needed for moderate pain. 80 tablet 0   hyoscyamine (ANASPAZ) 0.125 MG TBDP disintergrating tablet hyoscyamine 0.125 mg disintegrating tablet  TAKE 1 TABLET BY MOUTH 4 TIMES DAILY AS NEEDED FOR UP TO 60 DOSES (CRAMPING, PAIN)     Loperamide HCl (IMODIUM A-D PO)      LORazepam (ATIVAN) 1 MG tablet Take 1 tablet by mouth 3 (three) times daily.     peg 3350 powder (MOVIPREP) 100 g SOLR Take by mouth.     promethazine (PHENERGAN) 25 MG tablet TAKE 1/2 TABLET BY MOUTH EVERY 6 HOURS AS NEEDED FOR NAUSEA OR VOMITING 15  tablet 0   Trospium Chloride 60 MG CP24 Take 1 capsule by mouth every morning.      VENTOLIN HFA 108 (90 Base) MCG/ACT inhaler INHALE 2 PUFFS INTO THE LUNGS EVERY 6 HOURS AS NEEDED FOR WHEEZE 18 each 1   No facility-administered medications prior to visit.    PAST MEDICAL HISTORY: Past Medical History:  Diagnosis Date   Anxiety    Arthritis    Asthma    Bipolar 2 disorder (HCC)    pt stated, "I have Bipolar 2 and it is remission"   Bipolar affective (HCC)    CAD (coronary artery disease), native artery transplanted heart    Nonobstructive by coronary CTA 08/2021 with less than 25% stenosis in the LAD, RCA and left circumflex.   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   Full dentures    Gout    Headache(784.0)    migraines   Herpesviral infection    Hypertension    IBS (irritable bowel syndrome)    with diarrhea   Lymphadenopathy    Myalgia and myositis, unspecified    PAT (paroxysmal atrial tachycardia)    asymptomatic 10 beat run on event monitor 09/2020   PFO (patent foramen ovale)    Small PFO noted on coronary CTA   Pneumonia    PONV (postoperative nausea and vomiting)    PTSD (post-traumatic stress disorder)    Renal calculi    Restless legs syndrome (RLS)    Thoracic radiculopathy    Vitamin D deficiency    Wears glasses     PAST SURGICAL HISTORY: Past Surgical History:  Procedure Laterality Date    kidney stones     ABDOMINAL ADHESION SURGERY     ABDOMINAL HYSTERECTOMY  1995   partial- hemmoraged after surgery-stitch came loose   APPENDECTOMY  1993   hemmoraged after surgery- stitch came loose   CERVICAL DISC SURGERY  06/04/2001   with fusion   CHOLECYSTECTOMY  1985   colonoscopy     COLONOSCOPY     CYSTOSCOPY  FOOT SURGERY     lt   INCISIONAL HERNIA REPAIR N/A 04/22/2021   Procedure: LAPAROSCOPIC INCISIONAL HERNIA REPAIR WITH MESH;  Surgeon: Berna Bue, MD;  Location: [facility];  Service: General;  Laterality: N/A;   jj stent  07/2002   with ureteroscopy, cystoscopy and then removal of stent in office   LAPAROSCOPIC LYSIS OF ADHESIONS N/A 04/22/2021   Procedure: LYSIS OF ADHESIONS;  Surgeon: Berna Bue, MD;  Location: [facility];  Service: General;  Laterality: N/A;   LITHOTRIPSY     LYMPH NODE BIOPSY Right 03/06/2014   Procedure: right groin LYMPH NODE BIOPSY;  Surgeon: Robyne Askew, MD;  Location: [facility];  Service: General;  Laterality: Right;   LYMPHADENECTOMY N/A 12/29/2015   Procedure: RETROPERITONEAL LYMPHADENECTOMY;  Surgeon: Sebastian Ache, MD;  Location: [facility];  Service: Urology;  Laterality: N/A;   NECK SURGERY  2002   ROBOT ASSISTED LAPAROSCOPIC NEPHRECTOMY Left 12/29/2015   Procedure: XI ROBOTIC ASSISTED LEFT LAPAROSCOPIC NEPHRECTOMY;  Surgeon: Sebastian Ache, MD;  Location: [facility];  Service: Urology;  Laterality: Left;   TONSILLECTOMY  1976    FAMILY HISTORY: Family History  Problem Relation Age of Onset   Cancer Mother        stomach   Migraines Mother    Arthritis Mother    Emphysema Mother    Heart disease Father    Hypertension Father    Cancer Father        colon   Dementia Father    Hypertension Brother    Migraines Brother    Hypertension Brother    Migraines Brother    Alcohol abuse Brother    Bipolar disorder Brother    Migraines Daughter    Arthritis Maternal Grandmother    Cancer Maternal Grandmother        stomach   Diabetes Maternal Grandmother    Stroke Maternal Grandmother    Cancer Maternal Aunt        lung   Emphysema Maternal Aunt    Cancer Paternal Uncle        colon   Hypertension Maternal Aunt    Heart disease Maternal Aunt    Cancer Paternal Uncle        lung    SOCIAL HISTORY: Social History    Socioeconomic History   Marital status: Married    Spouse name: Not on file   Number of children: 1   Years of education: COLLEGE1   Highest education level: Not on file  Occupational History   Occupation: HOUSEWIFE    Employer: UNEMPLOYED  Tobacco Use   Smoking status: Never   Smokeless tobacco: Never  Vaping Use   Vaping status: Never Used  Substance and Sexual Activity   Alcohol use: No   Drug use: No   Sexual activity: Not on file  Other Topics Concern   Not on file  Social History Narrative   Patient is right handed.   Patient drinks caffeine occasionally.   Social Determinants of Health   Financial Resource Strain: Not on file  Food Insecurity: No Food Insecurity (10/18/2022)   Hunger Vital Sign    Worried About Running Out of Food in the Last Year: Never true    Ran Out of Food in the Last Year: Never true  Transportation Needs: Not on file  Physical Activity: Not on file  Stress: Not on file  Social Connections: Not on file  Intimate Partner Violence: Not on file  PHYSICAL EXAM  There were no vitals filed for this visit. There is no height or weight on file to calculate BMI.  Generalized: Well developed, in no acute distress  Neurological examination  Mentation: Alert oriented to time, place, history taking. Follows all commands speech and language fluent Cranial nerve II-XII: Pupils were equal round reactive to light. Extraocular movements were full, visual field were full on confrontational test. Facial sensation and strength were normal. Uvula tongue midline. Head turning and shoulder shrug  were normal and symmetric. Motor: The motor testing reveals 5 over 5 strength of all 4 extremities. Good symmetric motor tone is noted throughout.  Sensory: Sensory testing is intact to soft touch on all 4 extremities. No evidence of extinction is noted.  Coordination: Cerebellar testing reveals good finger-nose-finger and heel-to-shin bilaterally.  Gait and  station: Gait is normal. Tandem gait is normal. Romberg is negative. No drift is seen.  Reflexes: Deep tendon reflexes are symmetric and normal bilaterally.   DIAGNOSTIC DATA (LABS, IMAGING, TESTING) - I reviewed patient records, labs, notes, testing and imaging myself where available.  Lab Results  Component Value Date   WBC 5.4 03/30/2022   HGB 12.2 03/30/2022   HCT 37.2 03/30/2022   MCV 95.6 03/30/2022   PLT 282 03/30/2022      Component Value Date/Time   NA 140 03/30/2022 0836   NA 143 08/11/2021 1600   NA 140 12/01/2014 1039   K 3.6 03/30/2022 0836   K 2.8 (LL) 12/01/2014 1039   CL 98 03/30/2022 0836   CO2 36 (H) 03/30/2022 0836   CO2 30 (H) 12/01/2014 1039   GLUCOSE 85 03/30/2022 0836   GLUCOSE 143 (H) 12/01/2014 1039   BUN 11 03/30/2022 0836   BUN 15 08/11/2021 1600   BUN 14.3 12/01/2014 1039   CREATININE 0.88 03/30/2022 0836   CREATININE 0.8 12/01/2014 1039   CALCIUM 10.0 03/30/2022 0836   CALCIUM 10.0 12/01/2014 1039   PROT 8.0 03/30/2022 0836   PROT 7.4 03/24/2020 1000   PROT 8.4 (H) 12/01/2014 1039   ALBUMIN 4.4 03/30/2022 0836   ALBUMIN 4.3 03/24/2020 1000   ALBUMIN 4.1 12/01/2014 1039   AST 28 03/30/2022 0836   AST 23 12/01/2014 1039   ALT 14 03/30/2022 0836   ALT 14 12/01/2014 1039   ALKPHOS 73 03/30/2022 0836   ALKPHOS 71 12/01/2014 1039   BILITOT 0.4 03/30/2022 0836   BILITOT 0.4 03/24/2020 1000   BILITOT 0.22 12/01/2014 1039   GFRNONAA >60 03/30/2022 0836   GFRAA 105 03/24/2020 1000   Lab Results  Component Value Date   CHOL 264 (H) 08/18/2021   HDL 58 08/18/2021   LDLCALC 159 (H) 08/18/2021   TRIG 255 (H) 08/18/2021   CHOLHDL 4.6 (H) 08/18/2021   Lab Results  Component Value Date   HGBA1C 5.7 (H) 08/28/2017   Lab Results  Component Value Date   VITAMINB12 299 08/28/2017   Lab Results  Component Value Date   TSH 1.010 03/24/2020    Margie Ege, AGNP-C, DNP 04/30/2023, 5:35 AM [facility] [address], [address] [place], [place] [address] [phone]

## 2023-05-04 ENCOUNTER — Telehealth: Payer: Self-pay | Admitting: Hematology and Oncology

## 2023-05-10 ENCOUNTER — Telehealth: Payer: Self-pay | Admitting: Neurology

## 2023-05-10 MED ORDER — PROMETHAZINE HCL 25 MG PO TABS: ORAL_TABLET | ORAL | 0 refills | Status: DC

## 2023-05-10 NOTE — Telephone Encounter (Signed)
Pt request refill for promethazine (PHENERGAN) 25 MG tablet  sent to [facility] #[id number]

## 2023-05-10 NOTE — Telephone Encounter (Signed)
Requested Prescriptions   Pending Prescriptions Disp Refills   promethazine (PHENERGAN) 25 MG tablet 15 tablet 0    Sig: TAKE 1/2 TABLET BY MOUTH EVERY 6 HOURS AS NEEDED FOR NAUSEA OR VOMITING  Dispenses   Dispensed Days Supply Quantity Provider Pharmacy  PROMETHAZINE 25MG  TABLETS 04/12/2023 7 15 each Glean Salvo, NP [facility] #...  PROMETHAZINE 25MG  TABLETS 03/06/2023 7 15 each Glean Salvo, NP [facility] DR[facility] STORE #...  PROMETHAZINE 25MG  TABLETS 02/06/2023 7 15 each Glean Salvo, NP [facility] STORE #...  PROMETHAZINE 25MG  TABLETS 01/15/2023 7 15 each Glean Salvo, NP [facility] DRUG STORE #...  PROMETHAZINE 25MG  TABLETS 12/25/2022 7 15 each Glean Salvo, NP [facility] STORE #...  PROMETHAZINE 25MG  TABLETS 12/01/2022 30 15 each Glean Salvo, NP [facility] DR[facility] STORE #...  PROMETHAZINE 25MG  TABLETS 10/17/2022 10 20 each Glean Salvo, NP [facility] DRUG STORE #...  PROMETHAZINE 25MG  TABLETS 09/20/2022 10 20 each Glean Salvo, NP [facility] STORE #...  PROMETHAZINE 25MG  TABLETS 08/21/2022 10 20 each Glean Salvo, NP [facility] DR[facility] STORE #...  PROMETHAZINE 25MG  TABLETS 07/12/2022 10 20 each Glean Salvo, NP [facility] DRUG STORE #...  PROMETHAZINE 25MG  TABLETS 06/08/2022 15 30 each Glean Salvo, NP [facility] DRUG STORE #.Marland KitchenMarland Kitchen

## 2023-05-22 ENCOUNTER — Ambulatory Visit: Payer: Medicare Other | Admitting: Family Medicine

## 2023-05-23 ENCOUNTER — Ambulatory Visit: Payer: Medicare Other | Admitting: Family

## 2023-05-24 ENCOUNTER — Telehealth: Payer: Self-pay

## 2023-05-24 NOTE — Telephone Encounter (Signed)
She called and left a message. She has a migraine and will not be able to come to appts tomorrow. She would like to reschedule. Called her back and told her appts canceled. Sent scheduling message to reschedule.

## 2023-05-25 ENCOUNTER — Inpatient Hospital Stay: Payer: Medicare Other | Admitting: Hematology and Oncology

## 2023-05-25 ENCOUNTER — Inpatient Hospital Stay: Payer: Medicare Other

## 2023-05-30 ENCOUNTER — Encounter: Payer: Medicare Other | Admitting: Physical Medicine & Rehabilitation

## 2023-06-01 ENCOUNTER — Other Ambulatory Visit: Payer: Self-pay | Admitting: Nurse Practitioner

## 2023-06-05 NOTE — Telephone Encounter (Signed)
She can talk with Dr. Constance Goltz about an epipen refill at her upcoming appt on 06/15/2023

## 2023-06-07 ENCOUNTER — Telehealth: Payer: Self-pay | Admitting: Neurology

## 2023-06-07 ENCOUNTER — Other Ambulatory Visit: Payer: Self-pay | Admitting: Neurology

## 2023-06-07 NOTE — Telephone Encounter (Signed)
Pt called needing a refill on her  promethazine (PHENERGAN) 25 MG tablet and needing it sent to the [facility] on [address].

## 2023-06-07 NOTE — Telephone Encounter (Signed)
I refilled phenergan, but patient was supposed to follow up in 6 months, she cancelled 2 appointments. I think she could be followed by her primary care doctor in the future. Please have her come for sooner appointment than Feb 2025 if she is going to stay at our office. We have reduced the phenergan down to 15 tablets a month.   Meds ordered this encounter  Medications   promethazine (PHENERGAN) 25 MG tablet    Sig: TAKE 1/2 TABLET BY MOUTH EVERY 6 HOURS AS NEEDED FOR NAUSEA OR VOMITING    Dispense:  15 tablet    Refill:  0

## 2023-06-07 NOTE — Telephone Encounter (Signed)
Pt states she is being told by [facility]#[id number] that a provider authorization is needed for the promethazine (PHENERGAN) 25 MG tablet

## 2023-06-08 ENCOUNTER — Other Ambulatory Visit: Payer: Medicare Other

## 2023-06-11 ENCOUNTER — Encounter: Payer: Medicare Other | Admitting: Registered Nurse

## 2023-06-11 ENCOUNTER — Telehealth: Payer: Self-pay | Admitting: Registered Nurse

## 2023-06-11 ENCOUNTER — Telehealth: Payer: Self-pay

## 2023-06-11 DIAGNOSIS — M797 Fibromyalgia: Secondary | ICD-10-CM

## 2023-06-11 DIAGNOSIS — G894 Chronic pain syndrome: Secondary | ICD-10-CM

## 2023-06-11 NOTE — Telephone Encounter (Signed)
Pt is calling to see if any appointments were available pt thinks she has the Flu. Husband has same symptoms didn't go to the doctor but COVID was NEG. Symptoms are Fever more so in the evening, cough, chills, vomit, diarrhea, Neg COVID-Saturday all started Friday night progressively got worse Saturday.   Pt has an appointment on Friday 06/15/23

## 2023-06-11 NOTE — Telephone Encounter (Signed)
Patient needs a refill on Norco and Flexeril.

## 2023-06-11 NOTE — Telephone Encounter (Signed)
If we have a nurse visit available before then we can have her come in to swab for flu. If not she would need to go to an urgent care to get swabbed.

## 2023-06-12 ENCOUNTER — Ambulatory Visit: Payer: Medicare Other | Admitting: Family

## 2023-06-12 ENCOUNTER — Ambulatory Visit: Payer: Medicare Other

## 2023-06-12 MED ORDER — CYCLOBENZAPRINE HCL 10 MG PO TABS
10.0000 mg | ORAL_TABLET | Freq: Three times a day (TID) | ORAL | 4 refills | Status: DC
Start: 2023-06-12 — End: 2023-12-17

## 2023-06-12 MED ORDER — HYDROCODONE-ACETAMINOPHEN 5-325 MG PO TABS: 1.0000 | ORAL_TABLET | Freq: Three times a day (TID) | ORAL | 0 refills | Status: DC | PRN

## 2023-06-12 NOTE — Telephone Encounter (Signed)
PMP was Reviewed.  Hydrocodone e- scribed today Flexeril refill sent to pharmacy.

## 2023-06-12 NOTE — Addendum Note (Signed)
Addended by: Jones Bales on: 06/12/2023 10:03 AM   Modules accepted: Orders

## 2023-06-15 ENCOUNTER — Encounter: Payer: Self-pay | Admitting: Family Medicine

## 2023-06-15 ENCOUNTER — Telehealth (INDEPENDENT_AMBULATORY_CARE_PROVIDER_SITE_OTHER): Payer: Medicare Other | Admitting: Family Medicine

## 2023-06-15 VITALS — BP 142/79 | HR 114 | Ht 65.0 in | Wt 153.7 lb

## 2023-06-15 DIAGNOSIS — J069 Acute upper respiratory infection, unspecified: Secondary | ICD-10-CM | POA: Insufficient documentation

## 2023-06-15 MED ORDER — EPINEPHRINE 0.3 MG/0.3ML IJ SOAJ: 0.3000 mg | INTRAMUSCULAR | 2 refills | Status: DC | PRN

## 2023-06-15 NOTE — Progress Notes (Signed)
Established Patient Office Visit  Subjective   Patient ID: Patty Salas, female    DOB: [birth year]  Age: 67 y.o. MRN: [id number]  Chief Complaint  Patient presents with   Medical Management of Chronic Issues   Telemedicine visit Patient location Home Provider location: [facility] primary care Video visit was attempted but unsuccessful due to technology issues Length of visit: 28 minutes  HPI  Patient calls to meet her new provider but also update on recent illness.  Patient states she has been sick for 2 weeks.  Was having sore throat, chills, malaise, cough and congestion, runny nose and vomiting, diarrhea.  Currently patient states symptoms include body aches and congestion.  Patient has questions about if she can take Coricidin cold and flu as well as a cough and congestion medicine that contains dextromethorphan/guaifenesin.  Patient states she has been prescribed levofloxacin in the past for sinus infection.  She does not want a antibiotic right now because she does not want to take any medications if she does not have to.  Patient then gives a history of her current medical issues.  Patient sees a pain medication doctor for chronic pain and fibromyalgia.  She has weaned off of fentanyl patch and is currently weaning off Norco.  Patient has an oncologist.  She has a history of renal cell carcinoma status post nephrectomy.  History of follicular lymphoma.  States she currently has some nodules in her lungs that are being evaluated.  Patient has a history of bipolar 2, PTSD anxiety and sees Dr. Betti Cruz.  She multiple times says she is at peace with her life and emphasizes she has not suicidal.  She sees cardiology, Dr. Mayford Knife and has an upcoming appointment with them.  Has a history of statin intolerance with elevated cholesterol.  The patient has her EpiPen filled through [place] as well as her albuterol.  Emphasizes that the prescription is for albuterol, Ventolin his Ventolin is more  expensive..  Patient has 80 allergies/contraindications listed in her chart.  She would like a refill of her EpiPen.  Just got refills of her albuterol so does not need that at this time.    The 10-year ASCVD risk score (Arnett DK, et al., 2019) is: 13.6%  Health Maintenance Due  Topic Date Due   Zoster Vaccines- Shingrix (1 of 2) 12/29/1973   MAMMOGRAM  05/20/2009   DTaP/Tdap/Td (2 - Td or Tdap) 10/12/2016   Medicare Annual Wellness (AWV)  09/06/2022   INFLUENZA VACCINE  05/03/2023   COVID-19 Vaccine (5 - 2023-24 season) 06/03/2023      Objective:     BP (!) 142/79 Comment: not in office  Pulse (!) 114   Ht 5\' 5"  (1.651 m)   Wt 153 lb 11.2 oz (69.7 kg)   BMI 25.58 kg/m    Physical Exam Unable to obtain due to being a audio visit.      Assessment & Plan:   Viral upper respiratory tract infection Assessment & Plan: Patient complains of cough, congestion, rhinorrhea, sinus pressure.  Discussed using the Coricidin cold and flu as well as the cough medication containing dextromethorphan and guaifenesin.  Advised her she can take both as they do not overlap ingredients.  She has taken these in the past without difficulty.  She does not want to take an antibiotic at this time but states that levofloxacin is a antibiotics she has tolerated in the past.  Advised patient to follow-up next week if symptoms are not improving  Other orders -     EPINEPHrine; Inject 0.3 mg into the muscle as needed for anaphylaxis.  Dispense: 2 each; Refill: 2     No follow-ups on file.    Sandre Kitty, MD

## 2023-06-15 NOTE — Assessment & Plan Note (Signed)
Patient complains of cough, congestion, rhinorrhea, sinus pressure.  Discussed using the Coricidin cold and flu as well as the cough medication containing dextromethorphan and guaifenesin.  Advised her she can take both as they do not overlap ingredients.  She has taken these in the past without difficulty.  She does not want to take an antibiotic at this time but states that levofloxacin is a antibiotics she has tolerated in the past.  Advised patient to follow-up next week if symptoms are not improving

## 2023-06-18 ENCOUNTER — Telehealth: Payer: Self-pay

## 2023-06-18 ENCOUNTER — Telehealth: Payer: Self-pay | Admitting: Family

## 2023-06-18 NOTE — Telephone Encounter (Signed)
Patient called asked if she can get a sooner appointment with [facility] tomorrow. Patient said she will be driving herself.   Patient said she had a Lidocaine injection. The number to contact patient is [phone]

## 2023-06-18 NOTE — Telephone Encounter (Signed)
Called her back. She is asking if she needs to be fasting for lab appt 9/19. Told her that she does not need to be fasting. She verbalized understanding.

## 2023-06-19 NOTE — Telephone Encounter (Signed)
At this time, I do not have anywhere to put this patient in this morning. Patty Salas is overbooked.

## 2023-06-20 ENCOUNTER — Other Ambulatory Visit: Payer: Self-pay

## 2023-06-20 ENCOUNTER — Telehealth: Payer: Self-pay | Admitting: Family

## 2023-06-20 ENCOUNTER — Ambulatory Visit: Payer: Medicare Other | Admitting: Family

## 2023-06-20 ENCOUNTER — Telehealth: Payer: Self-pay

## 2023-06-20 NOTE — Telephone Encounter (Signed)
Patient called and wants to do a televisit. Her appointment is today. CB#[phone]

## 2023-06-20 NOTE — Telephone Encounter (Signed)
Pt was wanting a cortisone injection for Patty Salas  cx appt for today and resch for Friday at 10:45

## 2023-06-20 NOTE — Telephone Encounter (Signed)
Pt called needing to speak to the RN regarding this medication. Please advise.

## 2023-06-20 NOTE — Telephone Encounter (Signed)
Called pt back and informed her that scheduling will be calling her with her next appts.  Pt verbalized understanding.

## 2023-06-20 NOTE — Telephone Encounter (Signed)
Patty Salas called and needs to cancel her appointments for lab and Dr. Bertis Ruddy tomorrow but she would like to reschedule them.  She is having issues with a severe migraine and will not be able to come in tomorrow. She is also getting her shots in her wrist at [facility] on Friday.  She is requesting a call back from a nurse today to let them know more details.  Routing to pod and Dr. Bertis Ruddy for advice on rescheduling and for the nurse covering to call back. Lorayne Marek, RN

## 2023-06-21 ENCOUNTER — Telehealth: Payer: Self-pay | Admitting: *Deleted

## 2023-06-21 ENCOUNTER — Ambulatory Visit: Payer: Medicare Other | Admitting: Hematology and Oncology

## 2023-06-21 ENCOUNTER — Other Ambulatory Visit: Payer: Medicare Other

## 2023-06-21 MED ORDER — LEVOFLOXACIN 500 MG PO TABS: 500.0000 mg | ORAL_TABLET | Freq: Every day | ORAL | 0 refills | Status: AC

## 2023-06-21 NOTE — Telephone Encounter (Signed)
LM informing pt of below.

## 2023-06-21 NOTE — Telephone Encounter (Addendum)
Please call her, tell her to call back when all her other health issues are resolved Non urgent  Contacted patient with Dr. Maxine Glenn message above. She verbalized understanding.

## 2023-06-21 NOTE — Telephone Encounter (Signed)
I have sent in her antibiotic

## 2023-06-21 NOTE — Telephone Encounter (Signed)
Pt calling to say that she would like the antibiotic called in that her and provider discussed.  Pt states she is having same symptoms, cough, chill, runny nose diarrhea and vomiting she said yall talked about all of this at that visit.    levofloxacin    [id number] - [place], [place] - [address]

## 2023-06-22 ENCOUNTER — Ambulatory Visit: Payer: Medicare Other | Admitting: Family

## 2023-06-25 ENCOUNTER — Encounter: Payer: Medicare Other | Admitting: Registered Nurse

## 2023-06-25 ENCOUNTER — Telehealth: Payer: Self-pay | Admitting: Cardiology

## 2023-06-25 MED ORDER — FUROSEMIDE 20 MG PO TABS: 20.0000 mg | ORAL_TABLET | Freq: Every day | ORAL | 0 refills | Status: DC | PRN

## 2023-06-25 NOTE — Telephone Encounter (Signed)
*  STAT* If patient is at the pharmacy, call can be transferred to refill team.   1. Which medications need to be refilled? (please list name of each medication and dose if known) furosemide (LASIX) 20 MG tablet    2. Would you like to learn more about the convenience, safety, & potential cost savings by using the [facility]?   3. Are you open to using the [facility] (Type [facility].  ).   4. Which pharmacy/location (including street and city if local pharmacy) is medication to be sent to? [facility] #[id number] - , [place] - [address]    5. Do they need a 30 day or 90 day supply? 90 day  Patient has appt for December 9th.

## 2023-06-27 ENCOUNTER — Telehealth: Payer: Self-pay | Admitting: Hematology and Oncology

## 2023-06-27 ENCOUNTER — Ambulatory Visit: Payer: Medicare Other | Admitting: Nurse Practitioner

## 2023-06-27 NOTE — Telephone Encounter (Signed)
Left patient a message in regards to scheduled appointment times/dates; left callback if needed for reschedule

## 2023-07-04 ENCOUNTER — Ambulatory Visit: Payer: Medicare Other | Admitting: Family

## 2023-07-05 ENCOUNTER — Telehealth: Payer: Self-pay | Admitting: Neurology

## 2023-07-05 ENCOUNTER — Telehealth: Payer: Self-pay

## 2023-07-05 ENCOUNTER — Other Ambulatory Visit: Payer: Self-pay | Admitting: Cardiology

## 2023-07-05 ENCOUNTER — Other Ambulatory Visit: Payer: Self-pay | Admitting: Neurology

## 2023-07-05 ENCOUNTER — Telehealth: Payer: Self-pay | Admitting: Registered Nurse

## 2023-07-05 MED ORDER — ALMOTRIPTAN MALATE 12.5 MG PO TABS: 12.5000 mg | ORAL_TABLET | ORAL | 1 refills | Status: DC | PRN

## 2023-07-05 MED ORDER — PROMETHAZINE HCL 25 MG PO TABS: ORAL_TABLET | ORAL | 0 refills | Status: DC

## 2023-07-05 NOTE — Telephone Encounter (Signed)
Called pharmacy ([facility] on [address] and they confirmed that they received the prescription of promethazine and it will be ready by 5:30 pm today.

## 2023-07-05 NOTE — Telephone Encounter (Signed)
Patient called back stating that pharmacy said her phenergan was denied. Advised that Maralyn Sago has not sent the script yet so she may be trying to refill the last script that was sent in with no refills. Advised that Maralyn Sago will be checking her messages but if she has not heard anything by 4:30 to call back. She states she is having a migraine now and left side of face is numb and she is drooling but reports that is normal and she is getting ready to take the axert. I reviewed stroke symptoms with patient and she says she has those symptoms but they are related to her migraine. Advised that stroke symptoms warrant an emergency room visit. She states again she knows it is not a stroke and needs to take her migraine medication. Patient verbalized understanding and will call back later this afternoon if she she has not heard from the pharmacy. Patient appreciative of call

## 2023-07-05 NOTE — Telephone Encounter (Signed)
Patient called to cancel appt with [facility] tomorrow, and she didn't sound good.  Her speech was slurred.  I asked her if she was having a stroke and she said no. I got Riley Lam on phone to speak with her and patient refused for [place] to call EMS.  We canceled her appointment tomorrow, and told her to call us back once she was feeling better.   Patient told Riley Lam on phone that she was drooling and had a migraine.

## 2023-07-05 NOTE — Telephone Encounter (Signed)
Pt is needing a refill on her  promethazine (PHENERGAN) 25 MG tablet sent in to the [facility]. Pt also stated that she would [facility]. Please advise.

## 2023-07-05 NOTE — Telephone Encounter (Signed)
Phone room: tell her this has been refilled

## 2023-07-05 NOTE — Telephone Encounter (Signed)
Phenergan sent to Sarah for refill approval and pharmacy updated. Advises patient via my chart she can [facility] to be sent to [facility].     Dispensed Days Supply Quantity Provider Pharmacy  PROMETHAZINE 25MG  TABLETS 06/08/2023 7 15 each Glean Salvo, NP [facility] DR[facility] STORE #...  PROMETHAZINE 25MG  TABLETS 05/10/2023 7 15 each Glean Salvo, NP [facility] DRUG STORE #...  PROMETHAZINE 25MG  TABLETS 04/12/2023 7 15 each Glean Salvo, NP [facility] DRUG STORE #...  PROMETHAZINE 25MG  TABLETS 03/06/2023 7 15 each Glean Salvo, NP [facility] DRUG STORE #...  PROMETHAZINE 25MG  TABLETS 02/06/2023 7 15 each Glean Salvo, NP [facility] DRUG STORE #...  PROMETHAZINE 25MG  TABLETS 01/15/2023 7 15 each Glean Salvo, NP [facility] DRUG STORE #...  PROMETHAZINE 25MG  TABLETS 12/25/2022 7 15 each Glean Salvo, NP [facility] DRUG STORE #...  PROMETHAZINE 25MG  TABLETS 12/01/2022 30 15 each Glean Salvo, NP [facility] DRUG STORE #...  PROMETHAZINE 25MG  TABLETS 10/17/2022 10 20 each Glean Salvo, NP [facility] DRUG STORE #...  PROMETHAZINE 25MG  TABLETS 09/20/2022 10 20 each Glean Salvo, NP [facility] DRUG STORE #...  PROMETHAZINE 25MG  TABLETS 08/21/2022 10 20 each Glean Salvo, NP [facility] DRUG STORE #...  PROMETHAZINE 25MG  TABLETS 07/12/2022 10 20 each Glean Salvo, NP [facility] DRUG STORE #.Marland KitchenMarland Kitchen

## 2023-07-05 NOTE — Telephone Encounter (Signed)
Patient called back and said she got a message from [facility] but can't read her phone. I advised to call [facility] to confirm that script was being filled. She also said she was felling better but going to pain management tomorrow and whoever she was speaking with advised on call EMS due to her describing same symptoms that reflected those of a stroke and her voice/speech audibly slurred. Patient states that she refused EMS, going to the ER and that was typical of her migraines. I asked if her husband was home and aware of how she was feeling and her refusal and she states yes. She also told me that if she is not feeling better she will not be able to go the pain management appointment. I advised that it is our recommendation she go to ER but she said please document that I am refusing because I know this is migraine related. I wished her well and reminder her we don't have urgent appointments, we are not open tomorrow and mychart messages will not be ready over the weekend.Patient appreciative and verbalized understanding. Margie Ege NP made aware.

## 2023-07-06 ENCOUNTER — Encounter: Payer: Medicare Other | Admitting: Registered Nurse

## 2023-07-06 ENCOUNTER — Telehealth: Payer: Self-pay | Admitting: Registered Nurse

## 2023-07-06 DIAGNOSIS — M797 Fibromyalgia: Secondary | ICD-10-CM

## 2023-07-06 DIAGNOSIS — G894 Chronic pain syndrome: Secondary | ICD-10-CM

## 2023-07-06 MED ORDER — HYDROCODONE-ACETAMINOPHEN 5-325 MG PO TABS
1.0000 | ORAL_TABLET | Freq: Three times a day (TID) | ORAL | 0 refills | Status: DC | PRN
Start: 2023-07-06 — End: 2023-08-10

## 2023-07-06 NOTE — Telephone Encounter (Signed)
PMP was Reviewed.  This provider called Patty Salas, her last office visit was in July 2024, she has a scheduled appointment with Dr Riley Kill on 07/18/2023, she is aware no more refills without office visit. She verbalizes understanding.

## 2023-07-06 NOTE — Addendum Note (Signed)
Addended by: Jones Bales on: 07/06/2023 04:16 PM   Modules accepted: Orders

## 2023-07-06 NOTE — Telephone Encounter (Signed)
Patient called in and wanted to let Riley Lam know she woke up feeling better today after her migraine. Patient wants to know what to do, she has an appointment scheduled with Dr Riley Kill 10/16 and will run out of hydrocodone 10/13. Should she schedule a sooner appointment with Riley Lam or could refill be sent to [facility]

## 2023-07-13 ENCOUNTER — Ambulatory Visit: Payer: Medicare Other | Admitting: Family

## 2023-07-13 ENCOUNTER — Encounter: Payer: Self-pay | Admitting: Family

## 2023-07-13 ENCOUNTER — Other Ambulatory Visit (HOSPITAL_COMMUNITY): Payer: Self-pay

## 2023-07-13 DIAGNOSIS — M654 Radial styloid tenosynovitis [de Quervain]: Secondary | ICD-10-CM | POA: Diagnosis not present

## 2023-07-13 NOTE — Progress Notes (Signed)
Office Visit Note   Patient: Patty Salas           Date of Birth: 14-Nov-1955           MRN: [id number] Visit Date: 07/13/2023              Requested by: Sandre Kitty, MD [address] [place],  [place] [address] PCP: Sandre Kitty, MD  Chief Complaint  Patient presents with   Left Wrist - Follow-up      HPI: The patient is a 67 year old woman who presents today in follow-up for her left wrist and thumb pain.  She does have a history of de Quervain's tenosynovitis reports that her symptoms are the same as prior episodes.  She has attempted using wrist splints as well as an extensive home exercise program without much relief of her pain.  She has tried topicals without relief she is also use Tylenol.  Reports good relief of her pain with her last cortisone injection which was June 26 of this year  Assessment & Plan: Visit Diagnoses:  1. De Quervain's tenosynovitis, right     Plan: Given a thumb spica splint today.  Return demonstration.  Depo-Medrol injection.  Patient tolerated well.  She will follow-up as needed.  Follow-Up Instructions: Return if symptoms worsen or fail to improve.   Left Hand Exam   Tenderness  The patient is experiencing tenderness in the radial area.   Range of Motion  The patient has normal left wrist ROM.  Muscle Strength  The patient has normal left wrist strength.  Tests  Finkelstein's test: positive  Other  Erythema: absent Sensation: normal Pulse: present      Patient is alert, oriented, no adenopathy, well-dressed, normal affect, normal respiratory effort.   Imaging: No results found. No images are attached to the encounter.  Labs: Lab Results  Component Value Date   HGBA1C 5.7 (H) 08/28/2017   LABURIC 6.3 03/07/2021     Lab Results  Component Value Date   ALBUMIN 4.4 03/30/2022   ALBUMIN 3.8 06/13/2021   ALBUMIN 4.2 03/07/2021    Lab Results  Component Value Date   MG 1.7 04/23/2021   MG 2.1  08/28/2017   Lab Results  Component Value Date   VD25OH 33.38 03/30/2022   VD25OH 19.91 (L) 03/07/2021   VD25OH 22.5 (L) 03/24/2020    No results found for: "PREALBUMIN"    Latest Ref Rng & Units 03/30/2022    8:36 AM 06/13/2021    1:48 PM 04/23/2021    3:44 AM  CBC EXTENDED  WBC 4.0 - 10.5 K/uL 5.4  6.7  9.7   RBC 3.87 - 5.11 MIL/uL 3.89  3.83  3.22   Hemoglobin 12.0 - 15.0 g/dL 65.7  84.6  96.2   HCT 36.0 - 46.0 % 37.2  37.8  31.7   Platelets 150 - 400 K/uL 282  295  245   NEUT# 1.7 - 7.7 K/uL 3.4     Lymph# 0.7 - 4.0 K/uL 1.2        There is no height or weight on file to calculate BMI.  Orders:  No orders of the defined types were placed in this encounter.  No orders of the defined types were placed in this encounter.    Procedures: Hand/UE Inj: L extensor compartment 1 for de Quervain's tenosynovitis on 07/13/2023 1:53 PM Indications: therapeutic and diagnostic Details: 22 G needle Medications: 1 mL lidocaine 1 %; 40 mg methylPREDNISolone acetate  40 MG/ML Outcome: tolerated well, no immediate complications Procedure, treatment alternatives, risks and benefits explained, specific risks discussed. Consent was given by the patient. Immediately prior to procedure a time out was called to verify the correct patient, procedure, equipment, support staff and site/side marked as required. Patient was prepped and draped in the usual sterile fashion.      Clinical Data: No additional findings.  ROS:  All other systems negative, except as noted in the HPI. Review of Systems  Objective: Vital Signs: There were no vitals taken for this visit.  Specialty Comments:  No specialty comments available.  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   S/P repair of ventral hernia 04/22/2021   Abdominal pain 03/07/2021   PAT (paroxysmal atrial tachycardia) (HCC)    Renal cell cancer (HCC) 11/21/2018   Family history of colon cancer in father 01/07/2018   History of adenomatous polyp of colon 01/07/2018   Internal hemorrhoids 01/07/2018   Irritable bowel syndrome with diarrhea 01/07/2018   Vitamin D deficiency 08/28/2017   Primary hypertension 08/28/2017   Herpes simplex- oral lesions 08/10/2017   Bipolar 1 disorder, mixed, moderate (HCC) 08/10/2017   History of anaphylaxis 08/10/2017   Postmenopausal syndrome 08/10/2017   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   History of kidney cancer 02/22/2016   De Quervain's tenosynovitis, right 04/24/2014   Enlarged lymph node 02/12/2014   BPPV (benign paroxysmal positional vertigo) 12/05/2013   Cervical spondylosis without myelopathy 09/17/2013   Post concussion syndrome 09/17/2013   Nephrolithiasis 02/11/2013   Carpal tunnel syndrome 01/01/2013   Wrist tendonitis 01/10/2012   Fibromyalgia/myofascial pain syndrome 12/06/2011   Lumbar spondylosis 12/06/2011   Lumbar degenerative disc disease 12/06/2011   Depression with anxiety 12/06/2011   Migraine aura, persistent, intractable 12/06/2011   Past Medical History:  Diagnosis Date   Anxiety    Arthritis    Asthma    Bipolar 2 disorder (HCC)    pt stated, "I have Bipolar 2 and it is remission"   Bipolar affective (HCC)    CAD (coronary artery disease), native artery transplanted heart    Nonobstructive by coronary CTA 08/2021 with less than 25% stenosis in the LAD, RCA and left circumflex.   Calcifying tendinitis of shoulder    Carpal tunnel syndrome    Cervical facet syndrome    Chronic pain syndrome    Complication of anesthesia    Depression    Diverticulosis    Falls    Fibromyalgia    Follicular lymphoma grade I of  intrapelvic lymph nodes (HCC) 02/22/2016   Full dentures    Gout    Headache(784.0)    migraines   Herpesviral infection    Hypertension    IBS (irritable bowel syndrome)    with diarrhea   Lymphadenopathy    Myalgia and myositis, unspecified    PAT (paroxysmal atrial tachycardia) (HCC)    asymptomatic 10 beat run on event monitor 09/2020   PFO (patent foramen ovale)    Small PFO noted on coronary CTA   Pneumonia    PONV (postoperative nausea and vomiting)    PTSD (post-traumatic stress disorder)    Renal calculi    Restless legs syndrome (RLS)  Thoracic radiculopathy    Vitamin D deficiency    Wears glasses     Family History  Problem Relation Age of Onset   Cancer Mother        stomach   Migraines Mother    Arthritis Mother    Emphysema Mother    Heart disease Father    Hypertension Father    Cancer Father        colon   Dementia Father    Hypertension Brother    Migraines Brother    Hypertension Brother    Migraines Brother    Alcohol abuse Brother    Bipolar disorder Brother    Migraines Daughter    Arthritis Maternal Grandmother    Cancer Maternal Grandmother        stomach   Diabetes Maternal Grandmother    Stroke Maternal Grandmother    Cancer Maternal Aunt        lung   Emphysema Maternal Aunt    Cancer Paternal Uncle        colon   Hypertension Maternal Aunt    Heart disease Maternal Aunt    Cancer Paternal Uncle        lung    Past Surgical History:  Procedure Laterality Date    kidney stones     ABDOMINAL ADHESION SURGERY     ABDOMINAL HYSTERECTOMY  1995   partial- hemmoraged after surgery-stitch came loose   APPENDECTOMY  1993   hemmoraged after surgery- stitch came loose   CERVICAL DISC SURGERY  06/04/2001   with fusion   CHOLECYSTECTOMY  1985   colonoscopy     COLONOSCOPY     CYSTOSCOPY     FOOT SURGERY     lt   INCISIONAL HERNIA REPAIR N/A 04/22/2021   Procedure: LAPAROSCOPIC INCISIONAL HERNIA REPAIR WITH MESH;  Surgeon:  Berna Bue, MD;  Location: [facility];  Service: General;  Laterality: N/A;   jj stent  07/2002   with ureteroscopy, cystoscopy and then removal of stent in office   LAPAROSCOPIC LYSIS OF ADHESIONS N/A 04/22/2021   Procedure: LYSIS OF ADHESIONS;  Surgeon: Berna Bue, MD;  Location: [facility];  Service: General;  Laterality: N/A;   LITHOTRIPSY     LYMPH NODE BIOPSY Right 03/06/2014   Procedure: right groin LYMPH NODE BIOPSY;  Surgeon: Robyne Askew, MD;  Location: [facility];  Service: General;  Laterality: Right;   LYMPHADENECTOMY N/A 12/29/2015   Procedure: RETROPERITONEAL LYMPHADENECTOMY;  Surgeon: Sebastian Ache, MD;  Location: [facility];  Service: Urology;  Laterality: N/A;   NECK SURGERY  2002   ROBOT ASSISTED LAPAROSCOPIC NEPHRECTOMY Left 12/29/2015   Procedure: XI ROBOTIC ASSISTED LEFT LAPAROSCOPIC NEPHRECTOMY;  Surgeon: Sebastian Ache, MD;  Location: [facility];  Service: Urology;  Laterality: Left;   TONSILLECTOMY  1976   Social History   Occupational History   Occupation: HOUSEWIFE    Associate Professor: UNEMPLOYED  Tobacco Use   Smoking status: Never   Smokeless tobacco: Never  Vaping Use   Vaping status: Never Used  Substance and Sexual Activity   Alcohol use: No   Drug use: No   Sexual activity: Not on file

## 2023-07-16 ENCOUNTER — Ambulatory Visit: Payer: Medicare Other | Admitting: Family Medicine

## 2023-07-16 NOTE — Progress Notes (Deleted)
Established Patient Office Visit  Subjective   Patient ID: Patty Salas, female    DOB: [birth year]  Age: 67 y.o. MRN: [id number]  No chief complaint on file.   HPI  Wanted to talk about medications?   The 10-year ASCVD risk score (Arnett DK, et al., 2019) is: 13.6%  Health Maintenance Due  Topic Date Due   Zoster Vaccines- Shingrix (1 of 2) 12/29/1973   MAMMOGRAM  05/20/2009   DTaP/Tdap/Td (2 - Td or Tdap) 10/12/2016   Medicare Annual Wellness (AWV)  09/06/2022   INFLUENZA VACCINE  05/03/2023   COVID-19 Vaccine (5 - 2023-24 season) 06/03/2023      Objective:     There were no vitals taken for this visit. {Vitals History (Optional):23777}  Physical Exam   No results found for any visits on 07/16/23.      Assessment & Plan:   There are no diagnoses linked to this encounter.   No follow-ups on file.    Sandre Kitty, MD

## 2023-07-17 ENCOUNTER — Telehealth: Payer: Self-pay | Admitting: Family

## 2023-07-17 MED ORDER — METHYLPREDNISOLONE ACETATE 40 MG/ML IJ SUSP
40.0000 mg | INTRAMUSCULAR | Status: AC | PRN
Start: 2023-07-13 — End: 2023-07-13
  Administered 2023-07-13: 40 mg

## 2023-07-17 MED ORDER — LIDOCAINE HCL 1 % IJ SOLN
1.0000 mL | INTRAMUSCULAR | Status: AC | PRN
Start: 2023-07-13 — End: 2023-07-13
  Administered 2023-07-13: 1 mL

## 2023-07-17 NOTE — Telephone Encounter (Signed)
Is now completed

## 2023-07-17 NOTE — Telephone Encounter (Signed)
Patient called stating she is unable to see her AVS for appt 10/11 in mychart.

## 2023-07-18 ENCOUNTER — Encounter: Payer: Self-pay | Admitting: Physical Medicine & Rehabilitation

## 2023-07-18 ENCOUNTER — Encounter: Payer: Medicare Other | Attending: Physical Medicine & Rehabilitation | Admitting: Physical Medicine & Rehabilitation

## 2023-07-18 VITALS — BP 145/77 | HR 102 | Ht 65.0 in | Wt 153.7 lb

## 2023-07-18 DIAGNOSIS — Z79891 Long term (current) use of opiate analgesic: Secondary | ICD-10-CM | POA: Insufficient documentation

## 2023-07-18 DIAGNOSIS — Z5181 Encounter for therapeutic drug level monitoring: Secondary | ICD-10-CM | POA: Diagnosis present

## 2023-07-18 DIAGNOSIS — G894 Chronic pain syndrome: Secondary | ICD-10-CM | POA: Diagnosis present

## 2023-07-18 DIAGNOSIS — M797 Fibromyalgia: Secondary | ICD-10-CM | POA: Diagnosis present

## 2023-07-18 MED ORDER — LIDOCAINE HCL 1 % IJ SOLN
10.0000 mL | Freq: Once | INTRAMUSCULAR | Status: AC
Start: 2023-07-18 — End: 2023-07-18
  Administered 2023-07-18: 10 mL

## 2023-07-18 NOTE — Patient Instructions (Signed)
ALWAYS FEEL FREE TO CALL OUR OFFICE WITH ANY PROBLEMS OR QUESTIONS ([phone])  **PLEASE NOTE** ALL MEDICATION REFILL REQUESTS (INCLUDING CONTROLLED SUBSTANCES) NEED TO BE MADE AT LEAST 7 DAYS PRIOR TO REFILL BEING DUE. ANY REFILL REQUESTS INSIDE THAT TIME FRAME MAY RESULT IN DELAYS IN RECEIVING YOUR PRESCRIPTION.                    

## 2023-07-18 NOTE — Progress Notes (Signed)
PROCEDURE NOTE  DIAGNOSIS: Encounter for long-term use of opiate analgesic - Plan: CANCELED: ToxAssure Select Plus  Chronic pain syndrome - Plan: CANCELED: ToxAssure Select Plus  Encounter for medication monitoring - Plan: CANCELED: ToxAssure Select Plus  Myofascial pain  INTERVENTION:  trigger point injections      After informed consent and preparation of the skin with isopropyl alcohol, I performed trigger point injections into the bilateral traps as well as the lumbar paraspinals, 6 areas in total, each with 1.5cc 1% lidocaine.  Additionally, aspiration was performed prior to injection. The patient tolerated well, and no complications were encountered. Afterward the area was cleaned and dressed. Post- injection instructions were provided.    She did not need hydrocodone refilled today   UDS today  Ranelle Oyster, MD, [facility] Physical Medicine & Rehabilitation 07/18/2023 .

## 2023-07-19 ENCOUNTER — Encounter: Payer: Self-pay | Admitting: Family Medicine

## 2023-07-23 ENCOUNTER — Telehealth: Payer: Self-pay | Admitting: Neurology

## 2023-07-23 NOTE — Telephone Encounter (Signed)
Pt would like a call from the nurse to discuss directions for almotriptan (AXERT) 12.5 MG tablet. Want to assure directions are correct on the box.

## 2023-07-23 NOTE — Telephone Encounter (Signed)
Called and LVM with directions for medication per DPR.

## 2023-07-25 ENCOUNTER — Other Ambulatory Visit (HOSPITAL_COMMUNITY): Payer: Self-pay

## 2023-07-25 ENCOUNTER — Telehealth: Payer: Self-pay | Admitting: Pharmacy Technician

## 2023-07-25 NOTE — Telephone Encounter (Signed)
Pharmacy Patient Advocate Encounter   Received notification from CoverMyMeds that prior authorization for Almotriptan Malate 12.5MG  tablets is required/requested.   Insurance verification completed.   The patient is insured through [facility] .   Per test claim: PA required; PA submitted to [facility] via CoverMyMeds Key/confirmation #/EOC [id number] Status is pending

## 2023-07-25 NOTE — Telephone Encounter (Signed)
Pharmacy Patient Advocate Encounter  Received notification from [employer] that Prior Authorization for Almotriptan Malate 12.5MG  tablets has been APPROVED from 10-02-2022 to 07-24-2024   PA #/Case ID/Reference #: [id number]

## 2023-07-27 ENCOUNTER — Ambulatory Visit: Payer: Medicare Other | Admitting: Family Medicine

## 2023-07-31 ENCOUNTER — Ambulatory Visit (INDEPENDENT_AMBULATORY_CARE_PROVIDER_SITE_OTHER): Payer: Medicare Other

## 2023-07-31 ENCOUNTER — Telehealth: Payer: Self-pay | Admitting: Neurology

## 2023-07-31 ENCOUNTER — Encounter: Payer: Medicare Other | Admitting: Nurse Practitioner

## 2023-07-31 DIAGNOSIS — Z0000 Encounter for general adult medical examination without abnormal findings: Secondary | ICD-10-CM | POA: Diagnosis not present

## 2023-07-31 MED ORDER — PROMETHAZINE HCL 25 MG PO TABS: ORAL_TABLET | ORAL | 0 refills | Status: DC

## 2023-07-31 NOTE — Telephone Encounter (Signed)
Requested Prescriptions   Pending Prescriptions Disp Refills   promethazine (PHENERGAN) 25 MG tablet 15 tablet 0    Sig: TAKE 1/2 TABLET BY MOUTH EVERY 6 HOURS AS NEEDED FOR NAUSEA OR VOMITING

## 2023-07-31 NOTE — Telephone Encounter (Signed)
Pt is requesting a refill for promethazine (PHENERGAN) 25 MG tablet .  Pharmacy: [facility] [phone]

## 2023-07-31 NOTE — Telephone Encounter (Signed)
Phone room: Notify pt that refill has been sent

## 2023-07-31 NOTE — Progress Notes (Signed)
Subjective:   Patty Salas is a 67 y.o. female who presents for Medicare Annual (Subsequent) preventive examination.  Visit Complete: Virtual I connected with  Patty Salas on 07/31/23 by a audio enabled telemedicine application and verified that I am speaking with the correct person using two identifiers.  Patient Location: Home  Provider Location: Office/Clinic  I discussed the limitations of evaluation and management by telemedicine. The patient expressed understanding and agreed to proceed.  Vital Signs: Because this visit was a virtual/telehealth visit, some criteria may be missing or patient reported. Any vitals not documented were not able to be obtained and vitals that have been documented are patient reported.    Cardiac Risk Factors include: advanced age (>30men, >24 women);hypertension     Objective:    Today's Vitals   07/31/23 1359  PainSc: 8    There is no height or weight on file to calculate BMI.     07/31/2023    2:17 PM 11/23/2021    1:06 PM 04/22/2021    6:59 AM 04/21/2021    1:38 PM 12/16/2020   12:46 PM 12/16/2020   12:39 PM 05/30/2017    3:38 PM  Advanced Directives  Does Patient Have a Medical Advance Directive? No No Yes Yes No Yes No  Type of Advance Directive   Living will;Healthcare Power of Attorney Living will;Healthcare Power of Attorney     Does patient want to make changes to medical advance directive?   No - Patient declined  No - Patient declined    Copy of Healthcare Power of Attorney in Chart?   No - copy requested      Would patient like information on creating a medical advance directive?     No - Patient declined Yes (ED - Information included in AVS)     Current Medications (verified) Outpatient Encounter Medications as of 07/31/2023  Medication Sig   acetaminophen (TYLENOL) 500 MG tablet Take 500 mg by mouth every 6 (six) hours as needed for moderate pain.   almotriptan (AXERT) 12.5 MG tablet Take 1 tablet (12.5 mg total) by  mouth as needed for migraine. may repeat in 2 hours if needed   bisacodyl (DULCOLAX) 5 MG EC tablet Take 10 mg by mouth daily as needed for mild constipation.    cyclobenzaprine (FLEXERIL) 10 MG tablet Take 1 tablet (10 mg total) by mouth 3 (three) times daily.   docusate sodium (COLACE) 100 MG capsule Take 100 mg by mouth daily as needed for mild constipation.    EPINEPHrine 0.3 mg/0.3 mL IJ SOAJ injection Inject 0.3 mg into the muscle as needed for anaphylaxis.   fexofenadine (ALLEGRA) 180 MG tablet Take 1 tablet (180 mg total) by mouth daily.   furosemide (LASIX) 20 MG tablet Take 1 tablet (20 mg total) by mouth daily as needed for edema. Please keep upcoming Dec appt for further refills. Thank you   gabapentin (NEURONTIN) 300 MG capsule Take 2 capsules by mouth at bedtime.   HYDROcodone-acetaminophen (NORCO/VICODIN) 5-325 MG tablet Take 1 tablet by mouth every 8 (eight) hours as needed for moderate pain.   hyoscyamine (ANASPAZ) 0.125 MG TBDP disintergrating tablet hyoscyamine 0.125 mg disintegrating tablet  TAKE 1 TABLET BY MOUTH 4 TIMES DAILY AS NEEDED FOR UP TO 60 DOSES (CRAMPING, PAIN)   Loperamide HCl (IMODIUM A-D PO)    LORazepam (ATIVAN) 1 MG tablet Take 1 tablet by mouth 3 (three) times daily.   Magnesium Hydroxide (DULCOLAX PO) 1 as needed  promethazine (PHENERGAN) 25 MG tablet TAKE 1/2 TABLET BY MOUTH EVERY 6 HOURS AS NEEDED FOR NAUSEA OR VOMITING   Trospium Chloride 60 MG CP24 Take 1 capsule by mouth every morning.    VENTOLIN HFA 108 (90 Base) MCG/ACT inhaler INHALE 2 PUFFS INTO THE LUNGS EVERY 6 HOURS AS NEEDED FOR WHEEZE   No facility-administered encounter medications on file as of 07/31/2023.    Allergies (verified) Aripiprazole, Aspirin, Bromfenac sodium, Chlorpromazine, Cymbalta [duloxetine hcl], Divalproex sodium, Duloxetine, Duract [bromfenac], Flurazepam, Hydrochlorothiazide, Imitrex [sumatriptan succinate], Latex, Mellaril, Metoclopramide, Olanzapine, Olanzapine,  Other, Penicillins, Quetiapine, Statins, Sumatriptan, Thioridazine, Topamax, Topiramate, Trifluoperazine, Aripiprazole, Dalmane [flurazepam hcl], Darifenacin hydrobromide er, Metoclopramide hcl, Seroquel [quetiapine fumerate], Stelazine, Thorazine [chlorpromazine hcl], Allopurinol, Aspirin effervescent, Barium sulfate, Benzocaine, Biofreeze [menthol (topical analgesic)], Camphor-menthol, Capsaicin, Capsaicin-menthol, Clindamycin, Clindamycin/lincomycin, Clove oil, Colchicine, Cycloheximide, Darifenacin, Diatrizoate, Diflucan [fluconazole], Erythromycin, Garlic, Iohexol, Ivp dye [iodinated contrast media], Metronidazole, Mirabegron, Miralax [polyethylene glycol], Moxifloxacin, Nsaids, Oxybutynin, Polyethylene glycol 3350, Potassium chloride, Potassium-containing compounds, Prednisone, Psyllium, Red dye #40 (allura red), Seroquel [quetiapine fumarate], Simvastatin, Urocit - k [potassium citrate], Avelox [moxifloxacin hcl in nacl], Barium-containing compounds, Butalbital-aspirin-caffeine, Diclofenac, Doxycycline, E-mycin [erythromycin base], Flagyl [metronidazole hcl], Lamotrigine, Pregabalin, Pregabalin, Risperidone, Risperidone, and Toradol [ketorolac tromethamine]   History: Past Medical History:  Diagnosis Date   Anxiety    Arthritis    Asthma    Bipolar 2 disorder (HCC)    pt stated, "I have Bipolar 2 and it is remission"   Bipolar affective (HCC)    CAD (coronary artery disease), native artery transplanted heart    Nonobstructive by coronary CTA 08/2021 with less than 25% stenosis in the LAD, RCA and left circumflex.   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   Full dentures    Gout    Headache(784.0)    migraines   Herpesviral infection    Hypertension     IBS (irritable bowel syndrome)    with diarrhea   Lymphadenopathy    Myalgia and myositis, unspecified    PAT (paroxysmal atrial tachycardia) (HCC)    asymptomatic 10 beat run on event monitor 09/2020   PFO (patent foramen ovale)    Small PFO noted on coronary CTA   Pneumonia    PONV (postoperative nausea and vomiting)    PTSD (post-traumatic stress disorder)    Renal calculi    Restless legs syndrome (RLS)    Thoracic radiculopathy    Vitamin D deficiency    Wears glasses    Past Surgical History:  Procedure Laterality Date    kidney stones     ABDOMINAL ADHESION SURGERY     ABDOMINAL HYSTERECTOMY  1995   partial- hemmoraged after surgery-stitch came loose   APPENDECTOMY  1993   hemmoraged after surgery- stitch came loose   CERVICAL DISC SURGERY  06/04/2001   with fusion   CHOLECYSTECTOMY  1985   colonoscopy     COLONOSCOPY     CYSTOSCOPY     FOOT SURGERY     lt   INCISIONAL HERNIA REPAIR N/A 04/22/2021   Procedure: LAPAROSCOPIC INCISIONAL HERNIA REPAIR WITH MESH;  Surgeon: Berna Bue, MD;  Location: [facility];  Service: General;  Laterality: N/A;   jj stent  07/2002  with ureteroscopy, cystoscopy and then removal of stent in office   LAPAROSCOPIC LYSIS OF ADHESIONS N/A 04/22/2021   Procedure: LYSIS OF ADHESIONS;  Surgeon: Berna Bue, MD;  Location: [facility];  Service: General;  Laterality: N/A;   LITHOTRIPSY     LYMPH NODE BIOPSY Right 03/06/2014   Procedure: right groin LYMPH NODE BIOPSY;  Surgeon: Robyne Askew, MD;  Location: [facility];  Service: General;  Laterality: Right;   LYMPHADENECTOMY N/A 12/29/2015   Procedure: RETROPERITONEAL LYMPHADENECTOMY;  Surgeon: Sebastian Ache, MD;  Location: [facility];  Service: Urology;  Laterality: N/A;   NECK SURGERY  2002   ROBOT ASSISTED LAPAROSCOPIC NEPHRECTOMY Left 12/29/2015   Procedure: XI ROBOTIC ASSISTED LEFT LAPAROSCOPIC NEPHRECTOMY;  Surgeon: Sebastian Ache, MD;  Location: [facility];  Service: Urology;   Laterality: Left;   TONSILLECTOMY  1976   Family History  Problem Relation Age of Onset   Cancer Mother        stomach   Migraines Mother    Arthritis Mother    Emphysema Mother    Heart disease Father    Hypertension Father    Cancer Father        colon   Dementia Father    Hypertension Brother    Migraines Brother    Hypertension Brother    Migraines Brother    Alcohol abuse Brother    Bipolar disorder Brother    Migraines Daughter    Arthritis Maternal Grandmother    Cancer Maternal Grandmother        stomach   Diabetes Maternal Grandmother    Stroke Maternal Grandmother    Cancer Maternal Aunt        lung   Emphysema Maternal Aunt    Cancer Paternal Uncle        colon   Hypertension Maternal Aunt    Heart disease Maternal Aunt    Cancer Paternal Uncle        lung   Social History   Socioeconomic History   Marital status: Married    Spouse name: Not on file   Number of children: 1   Years of education: COLLEGE1   Highest education level: Not on file  Occupational History   Occupation: HOUSEWIFE    Employer: UNEMPLOYED  Tobacco Use   Smoking status: Never   Smokeless tobacco: Never  Vaping Use   Vaping status: Never Used  Substance and Sexual Activity   Alcohol use: No   Drug use: No   Sexual activity: Not on file  Other Topics Concern   Not on file  Social History Narrative   Patient is right handed.   Patient drinks caffeine occasionally.   Social Determinants of Health   Financial Resource Strain: Low Risk  (07/31/2023)   Overall Financial Resource Strain (CARDIA)    Difficulty of Paying Living Expenses: Not hard at all  Food Insecurity: No Food Insecurity (07/31/2023)   Hunger Vital Sign    Worried About Running Out of Food in the Last Year: Never true    Ran Out of Food in the Last Year: Never true  Transportation Needs: No Transportation Needs (07/31/2023)   PRAPARE - Administrator, Civil Service (Medical): No    Lack of  Transportation (Non-Medical): No  Physical Activity: Inactive (07/31/2023)   Exercise Vital Sign    Days of Exercise per Week: 0 days    Minutes of Exercise per Session: 0 min  Stress: Stress Concern Present (07/31/2023)  [employer] of Occupational Health - Occupational Stress Questionnaire    Feeling of Stress : To some extent  Social Connections: Moderately Integrated (07/31/2023)   Social Connection and Isolation Panel [NHANES]    Frequency of Communication with Friends and Family: More than three times a week    Frequency of Social Gatherings with Friends and Family: More than three times a week    Attends Religious Services: More than 4 times per year    Active Member of [employer] or Organizations: No    Attends Engineer, structural: Never    Marital Status: Married    Tobacco Counseling Counseling given: Not Answered   Clinical Intake:  Pre-visit preparation completed: Yes  Pain : 0-10 Pain Score: 8  Pain Type: Chronic pain Pain Location: Hand Pain Orientation: Left, Right Pain Descriptors / Indicators: Aching Pain Onset: More than a month ago Pain Frequency: Constant     Nutritional Risks: None Diabetes: No  How often do you need to have someone help you when you read instructions, pamphlets, or other written materials from your doctor or pharmacy?: 1 - Never  Interpreter Needed?: No  Information entered by :: NAllen LPN   Activities of Daily Living    07/31/2023    2:02 PM  In your present state of health, do you have any difficulty performing the following activities:  Hearing? 0  Vision? 1  Comment needs new glasses  Difficulty concentrating or making decisions? 0  Walking or climbing stairs? 0  Dressing or bathing? 0  Doing errands, shopping? 0  Preparing Food and eating ? N  Using the Toilet? N  In the past six months, have you accidently leaked urine? N  Do you have problems with loss of bowel control? N  Managing your  Medications? N  Managing your Finances? N  Housekeeping or managing your Housekeeping? N    Patient Care Team: Sandre Kitty, MD as PCP - General (Family Medicine) Sharrell Ku, MD as Consulting Physician (Gastroenterology) Jerilee Field, MD as Consulting Physician (Urology) Berneice Heinrich Delbert Phenix., MD as Consulting Physician (Urology) Jones Bales, NP as Nurse Practitioner (Physical Medicine and Rehabilitation) Ranelle Oyster, MD as Consulting Physician (Physical Medicine and Rehabilitation) York Spaniel, MD (Inactive) as Consulting Physician (Neurology) Lenn Sink, DPM as Consulting Physician (Podiatry) Marcellina Millin, MD (Psychiatry) Prueter, Milas Kocher (Physical Medicine and Rehabilitation) Quintella Reichert, MD as Consulting Physician (Cardiology) Nadara Mustard, MD as Consulting Physician (Orthopedic Surgery) Francene Boyers, MD as Referring Physician (Optometry) Artis Delay, MD as Consulting Physician (Hematology and Oncology)  Indicate any recent Medical Services you may have received from other than [facility] providers in the past year (date may be approximate).     Assessment:   This is a routine wellness examination for Serrina.  Hearing/Vision screen Hearing Screening - Comments:: Denies hearing issues Vision Screening - Comments:: Regular eye exams, [facility]   Goals Addressed             This Visit's Progress    Patient Stated       07/31/2023, continue exercising       Depression Screen    07/31/2023    2:20 PM 07/18/2023   11:09 AM 06/15/2023   10:38 AM 11/17/2022    1:08 PM 10/18/2022   10:32 AM 08/15/2022    1:04 PM 04/20/2022   11:36 AM  PHQ 2/9 Scores  PHQ - 2 Score 4 2 0 0 1 2  2  PHQ- 9 Score 4  2        Fall Risk    07/31/2023    2:18 PM 07/18/2023   11:08 AM 04/09/2023    1:23 PM 03/14/2023   10:53 AM 11/30/2022   11:32 AM  Fall Risk   Falls in the past year? 0 0 0 0 0  Number falls in past yr: 0    0   Injury with Fall? 0    0  Risk for fall due to : Medication side effect      Follow up Falls prevention discussed;Falls evaluation completed        MEDICARE RISK AT HOME: Medicare Risk at Home Any stairs in or around the home?: No If so, are there any without handrails?: No Home free of loose throw rugs in walkways, pet beds, electrical cords, etc?: Yes Adequate lighting in your home to reduce risk of falls?: Yes Life alert?: No Use of a cane, walker or w/c?: No Grab bars in the bathroom?: Yes Shower chair or bench in shower?: Yes Elevated toilet seat or a handicapped toilet?: No  TIMED UP AND GO:  Was the test performed?  No    Cognitive Function:        07/31/2023    2:21 PM 09/06/2021   10:42 AM  6CIT Screen  What Year? 0 points 0 points  What month? 0 points 0 points  What time? 0 points 0 points  Count back from 20 0 points 0 points  Months in reverse 2 points 0 points  Repeat phrase 0 points 0 points  Total Score 2 points 0 points    Immunizations Immunization History  Administered Date(s) Administered   Fluad Quad(high Dose 65+) 09/30/2021   Influenza Inj Mdck Quad Pf 08/02/2017   Influenza Inj Mdck Quad With Preservative 08/12/2019   Influenza, High Dose Seasonal PF 06/29/2022   Influenza,inj,Quad PF,6+ Mos 10/07/2018   PNEUMOCOCCAL CONJUGATE-20 09/30/2021   Pneumococcal Polysaccharide-23 10/12/2006   Tdap 10/12/2006   Unspecified SARS-COV-2 Vaccination 12/23/2019, 12/23/2019, 01/15/2020, 01/15/2020   Zoster, Live 02/16/2015    TDAP status: Due, Education has been provided regarding the importance of this vaccine. Advised may receive this vaccine at local pharmacy or Health Dept. Aware to provide a copy of the vaccination record if obtained from local pharmacy or Health Dept. Verbalized acceptance and understanding.  Flu Vaccine status: Due, Education has been provided regarding the importance of this vaccine. Advised may receive this vaccine at local  pharmacy or Health Dept. Aware to provide a copy of the vaccination record if obtained from local pharmacy or Health Dept. Verbalized acceptance and understanding.  Pneumococcal vaccine status: Up to date  Covid-19 vaccine status: Information provided on how to obtain vaccines.   Qualifies for Shingles Vaccine? Yes   Zostavax completed Yes   Shingrix Completed?: No.    Education has been provided regarding the importance of this vaccine. Patient has been advised to call insurance company to determine out of pocket expense if they have not yet received this vaccine. Advised may also receive vaccine at local pharmacy or Health Dept. Verbalized acceptance and understanding.  Screening Tests Health Maintenance  Topic Date Due   Zoster Vaccines- Shingrix (1 of 2) 12/29/1973   MAMMOGRAM  05/20/2009   DTaP/Tdap/Td (2 - Td or Tdap) 10/12/2016   INFLUENZA VACCINE  05/03/2023   COVID-19 Vaccine (5 - 2023-24 season) 06/03/2023   Medicare Annual Wellness (AWV)  07/30/2024   Colonoscopy  01/30/2028  Pneumonia Vaccine 76+ Years old  Completed   DEXA SCAN  Completed   Hepatitis C Screening  Completed   HPV VACCINES  Aged Out    Health Maintenance  Health Maintenance Due  Topic Date Due   Zoster Vaccines- Shingrix (1 of 2) 12/29/1973   MAMMOGRAM  05/20/2009   DTaP/Tdap/Td (2 - Td or Tdap) 10/12/2016   INFLUENZA VACCINE  05/03/2023   COVID-19 Vaccine (5 - 2023-24 season) 06/03/2023    Colorectal cancer screening: Type of screening: Colonoscopy. Completed 01/29/2018. Repeat every 10 years  Mammogram status: patient to schedule  Bone Density status: Completed 07/16/2013.   Lung Cancer Screening: (Low Dose CT Chest recommended if Age 11-80 years, 20 pack-year currently smoking OR have quit w/in 15years.) does not qualify.   Lung Cancer Screening Referral: no  Additional Screening:  Hepatitis C Screening: does qualify; Completed 01/31/2015  Vision Screening: Recommended annual  ophthalmology exams for early detection of glaucoma and other disorders of the eye. Is the patient up to date with their annual eye exam?  Yes  Who is the provider or what is the name of the office in which the patient attends annual eye exams? [facility] If pt is not established with a provider, would they like to be referred to a provider to establish care? No .   Dental Screening: Recommended annual dental exams for proper oral hygiene  Diabetic Foot Exam: n/a  Community Resource Referral / Chronic Care Management: CRR required this visit?  Yes   CCM required this visit?  No     Plan:     I have personally reviewed and noted the following in the patient's chart:   Medical and social history Use of alcohol, tobacco or illicit drugs  Current medications and supplements including opioid prescriptions. Patient is currently taking opioid prescriptions. Information provided to patient regarding non-opioid alternatives. Patient advised to discuss non-opioid treatment plan with their provider. Functional ability and status Nutritional status Physical activity Advanced directives List of other physicians Hospitalizations, surgeries, and ER visits in previous 12 months Vitals Screenings to include cognitive, depression, and falls Referrals and appointments  In addition, I have reviewed and discussed with patient certain preventive protocols, quality metrics, and best practice recommendations. A written personalized care plan for preventive services as well as general preventive health recommendations were provided to patient.     Barb Merino, LPN   [id number]   After Visit Summary: (MyChart) Due to this being a telephonic visit, the after visit summary with patients personalized plan was offered to patient via MyChart   Nurse Notes: none

## 2023-07-31 NOTE — Patient Instructions (Signed)
Patty Salas , Thank you for taking time to come for your Medicare Wellness Visit. I appreciate your ongoing commitment to your health goals. Please review the following plan we discussed and let me know if I can assist you in the future.   Referrals/Orders/Follow-Ups/Clinician Recommendations: none  This is a list of the screening recommended for you and due dates:  Health Maintenance  Topic Date Due   Zoster (Shingles) Vaccine (1 of 2) 12/29/1973   Mammogram  05/20/2009   DTaP/Tdap/Td vaccine (2 - Td or Tdap) 10/12/2016   Flu Shot  05/03/2023   COVID-19 Vaccine (5 - 2023-24 season) 06/03/2023   Medicare Annual Wellness Visit  07/30/2024   Colon Cancer Screening  01/30/2028   Pneumonia Vaccine  Completed   DEXA scan (bone density measurement)  Completed   Hepatitis C Screening  Completed   HPV Vaccine  Aged Out    Advanced directives: (ACP Link)Information on Advanced Care Planning can be found at [facility] Advance Health Care Directives Advance Health Care Directives ([email or web address]   Next Medicare Annual Wellness Visit scheduled for next year: No, schedule not open for next year  Insert Preventive Care attachment Insert FALL PREVENTION attachment if needed

## 2023-08-01 ENCOUNTER — Telehealth: Payer: Self-pay | Admitting: *Deleted

## 2023-08-01 NOTE — Progress Notes (Signed)
Care Coordination   Note   08/01/2023 Name: [name] MRN: [id number] DOB: Aug 26, 1956  Patty Salas is a 67 y.o. year old female who sees Sandre Kitty, MD for primary care. I reached out to [place] by phone today to offer care coordination services.  Patty Salas was given information about Care Coordination services today including:   The Care Coordination services include support from the care team which includes your Nurse Coordinator, Clinical Social Worker, or Pharmacist.  The Care Coordination team is here to help remove barriers to the health concerns and goals most important to you. Care Coordination services are voluntary, and the patient may decline or stop services at any time by request to their care team member.   Care Coordination Consent Status: Patient agreed to services and verbal consent obtained.   Follow up plan:  Telephone appointment with care coordination team member scheduled for:  10/31  Encounter Outcome:  Patient Scheduled  [facility] Coordination Care Guide  Direct Dial: [phone]

## 2023-08-02 ENCOUNTER — Encounter: Payer: Self-pay | Admitting: Licensed Clinical Social Worker

## 2023-08-02 ENCOUNTER — Other Ambulatory Visit: Payer: Self-pay | Admitting: Family Medicine

## 2023-08-02 DIAGNOSIS — E782 Mixed hyperlipidemia: Secondary | ICD-10-CM

## 2023-08-02 DIAGNOSIS — C649 Malignant neoplasm of unspecified kidney, except renal pelvis: Secondary | ICD-10-CM

## 2023-08-03 ENCOUNTER — Inpatient Hospital Stay: Payer: Medicare Other

## 2023-08-03 ENCOUNTER — Inpatient Hospital Stay: Payer: Medicare Other | Admitting: Hematology and Oncology

## 2023-08-06 ENCOUNTER — Ambulatory Visit: Payer: Medicare Other | Admitting: Family Medicine

## 2023-08-10 ENCOUNTER — Other Ambulatory Visit: Payer: Self-pay

## 2023-08-10 ENCOUNTER — Other Ambulatory Visit: Payer: Medicare Other

## 2023-08-10 ENCOUNTER — Other Ambulatory Visit: Payer: Self-pay | Admitting: Family Medicine

## 2023-08-10 DIAGNOSIS — E782 Mixed hyperlipidemia: Secondary | ICD-10-CM

## 2023-08-10 DIAGNOSIS — G894 Chronic pain syndrome: Secondary | ICD-10-CM

## 2023-08-10 DIAGNOSIS — C649 Malignant neoplasm of unspecified kidney, except renal pelvis: Secondary | ICD-10-CM

## 2023-08-10 DIAGNOSIS — M797 Fibromyalgia: Secondary | ICD-10-CM

## 2023-08-11 LAB — BASIC METABOLIC PANEL
BUN/Creatinine Ratio: 15 (ref 12–28)
BUN: 12 mg/dL (ref 8–27)
CO2: 27 mmol/L (ref 20–29)
Calcium: 10.1 mg/dL (ref 8.7–10.3)
Chloride: 97 mmol/L (ref 96–106)
Creatinine, Ser: 0.78 mg/dL (ref 0.57–1.00)
Glucose: 81 mg/dL (ref 70–99)
Potassium: 3.9 mmol/L (ref 3.5–5.2)
Sodium: 141 mmol/L (ref 134–144)
eGFR: 83 mL/min/{1.73_m2} (ref >59)

## 2023-08-11 LAB — LIPID PANEL
Chol/HDL Ratio: 4.8 ratio — ABNORMAL HIGH (ref 0.0–4.4)
Cholesterol, Total: 276 mg/dL — ABNORMAL HIGH (ref 100–199)
HDL: 57 mg/dL (ref >39)
LDL Chol Calc (NIH): 177 mg/dL — ABNORMAL HIGH (ref 0–99)
Triglycerides: 222 mg/dL — ABNORMAL HIGH (ref 0–149)
VLDL Cholesterol Cal: 42 mg/dL — ABNORMAL HIGH (ref 5–40)

## 2023-08-11 LAB — MAGNESIUM: Magnesium: 1.7 mg/dL (ref 1.6–2.3)

## 2023-08-13 MED ORDER — HYDROCODONE-ACETAMINOPHEN 5-325 MG PO TABS: 1.0000 | ORAL_TABLET | Freq: Three times a day (TID) | ORAL | 0 refills | Status: DC | PRN

## 2023-08-13 NOTE — Telephone Encounter (Signed)
PMP was Reviewed.  Hydrocodone e-scribed to pharmacy.  Patty Salas is aware via My-Chart

## 2023-08-13 NOTE — Progress Notes (Unsigned)
Patient: Patty Salas Date of Birth: Aug 01, 1956  Reason for Visit: Follow up History from: Patient Primary Neurologist: Willis/Ahern   ASSESSMENT AND PLAN 67 y.o. year old female with long history of chronic migraine headaches.  Follows with PMR for chronic pain syndrome.  History of left renal cancer.  History of cervical spine surgery.  1.  Chronic migraine headaches  -Check MRI of the brain without contrast (reportedly significant allergy to IV dye, has only 1 kidney), checking MRI after calling during time of severe migraine with slurred speech, left-sided facial droop.  However, patient tells me this has been her typical migraine headache for years.  Dr. Anne Hahn had mentioned nonorganic left-sided sensory deficits in 2016. Also make sure no abnormality contributing to severe migraines despite multiple medications historically -I will order Ubrelvy 100 mg as needed for acute migraine headache given above-mentioned neurological symptoms with migraine, but are not new.  She has been on Axert for years with excellent benefit.  We did discuss ideally due to mechanism of action triptans are not the most favorable choice, but has worked well for her for years. -I will order Qulipta 60 mg daily for migraine preventative, concerned cost may be a factor with insurance. -She will continue Phenergan 25 mg tablet, she gets 15 tablets a month for nausea with migraines -Currently about 4 migraines weekly.  We have weaned down Phenergan to 15 tablets monthly, takes Axert twice weekly for migraine. -She has 80 medication allergies and contraindications.  For migraines previously tried and failed: Cymbalta, Depakote, Imitrex, Botox, Topamax, Seroquel, NSAIDs, Lamictal, Lyrica, Toradol, Risperdal, Reglan, Aimovig contraindicated due to latex allergy, Ajovy was too expensive.  Emgality was too expensive. -Next steps: With 80 medication allergies and contraindications concern she would have reaction to [facility]  as it is an infusion.  We may try Nurtec for prevention.  She is going to see a counselor to work on her stress management. -I will see her back in 6 months or sooner if needed  Meds ordered this encounter  Medications   Atogepant (QULIPTA) 60 MG TABS    Sig: Take 1 tablet (60 mg total) by mouth daily.    Dispense:  30 tablet    Refill:  11   Ubrogepant (UBRELVY) 100 MG TABS    Sig: Take 1 tablet (100 mg total) by mouth as needed. Take 1 tablet at onset of headache, may repeat in 2 hours if needed. Max is 200 mg in 24 hours.    Dispense:  12 tablet    Refill:  11    HISTORY OF PRESENT ILLNESS: Today 08/14/23 This is my first time seeing her in person. Has been seeing ortho for De Quervain's tenosynovitis bilateral, has had several injections lately. 4 migraines weekly. Claims tried Botox in the past with [name], says her neck became very swollen. Only takes Axert no more than twice a week, because doesn't want rebound headache. Takes 1/4 of phenergan at onset of migraine. Usually starts with phenergan, then will take Axert if needed. Triggers: smells/stress. Has always had numbness to left side of her body with migraine, can have slurred speech, drooling, migraine is right sided. She wants to work on handling her stress better, is going back to see her counselor. Remains on Norco from Dr Riley Kill, continues to be off fentanyl. Is working on Actor. Has history of left kidney removal due to renal cell carcinoma.  She has 80 medication allergies and contraindications.  HISTORY  July 12, 2022 SS: [name] is here today for follow up for virtual visit. She wanted to be seen in office but had flu like symptoms. Yesterday BP was 160/104, HR 108. Is off fentanyl, is planning to wean off Norco, Lorazepam. Going to pain clinic. Has not tolerated Botox in the past, made her head swell up. Remains under a lot of stress. Gets trigger point injections basically all over with [name]. Takes  Axert for migraine headaches, works great for her, pays cash. Takes all her monthly supply.She cannot afford CGRP injection. Has 10.5 tablets of phenergan left since last refill. Claims trying to be on less medications.   11/10/2020 SS: [name] is a 67 year old female with history of intractable migraine headache.  She has previously not tolerated Botox.  Has not been able to afford CGRP, any of them. She is allergic to 58 medications. 2-3 migraines a week, takes Axert, generic, no more than twice a week. Usually starts with phenergan 25 mg 1/2 tablet for migraine, if not helpful will take Axert.  Is really careful not to get rebound headache. Insurance won't cover, uses good rx coupon gets 24 tablets for 3 month period. Needs refill by 3/20. Sees pain management is on Fentanyl, Norco, Flexeril, Ativan. Is hopeful to one day just be on Flexeril. Often for headaches hot/cold mask works well. Axert works Firefighter. Here today for follow-up via telephone we couldn't connect on my my chart. A lot of stress about her husband's kidney issues.   REVIEW OF SYSTEMS: Out of a complete 14 system review of symptoms, the patient complains only of the following symptoms, and all other reviewed systems are negative.  See HPI  ALLERGIES: Allergies  Allergen Reactions   Aripiprazole Anaphylaxis    Stiffened muscles, extrapyramidal effects all over body.  Other reaction(s): Other (See Comments) Stiffened muscles Stiffened muscles, extrapyramidal effects all over body.  Stiffened muscles Stiffened muscles, extrapyramidal effects all over body.     Aspirin Hives, Itching and Anaphylaxis   Bromfenac Sodium Anaphylaxis   Chlorpromazine Anaphylaxis    Other reaction(s): Other (See Comments) Stiffened muscles Stiffened muscles    Cymbalta [Duloxetine Hcl] Anaphylaxis    Suicidal if in combination w/ Axert   Divalproex Sodium Anaphylaxis   Duloxetine Anaphylaxis    Suicidal if in combination w/ Axert Suicidal if  in combination w/ Axert   Duract [Bromfenac] Anaphylaxis   Flurazepam Anaphylaxis   Hydrochlorothiazide Anaphylaxis    Pt loses facial movement uncontrolled;  Pt loses facial movement uncontrolled;  Pt loses facial movement uncontrolled;    Imitrex [Sumatriptan Succinate] Anaphylaxis   Latex Anaphylaxis   Mellaril Anaphylaxis   Metoclopramide Anaphylaxis and Other (See Comments)    Headaches.  Other reaction(s): Headache, Other (See Comments) headache Headaches.  headache Headaches.   Other reaction(s): headache   Olanzapine Anaphylaxis   Olanzapine Anaphylaxis and Other (See Comments)    Unknown    Other Itching, Other (See Comments), Hives, Nausea And Vomiting, Rash and Anaphylaxis    Dust, mold, feathers, wool, flannel, cologne, chlorox, household cleaning products, scented candles, cinnamon, ivory soap, paints, sun sentitive, acidic fruits, ( lemons, limes, strawbery, tartrazine yellow#5 and 6 in foods, MSG food additives, sudiium lauryl sulates, hot peppers, spearmint, wintergreen peppermint, mentol, orange, jalapenos. ---headache, breathing, welps, canker sores inside mouth, uticaria Other reaction(s): Other (See Comments) Potassium-containing Compounds-Undiluted through IV. --Pain.  Dust, mold, feathers, wool, flannel, cologne, chlorox, household cleaning products, scented candles, cinnamon, ivory soap, paints, sun sentitive, acidic fruits, (  lemons, limes, strawbery, tartrazine yellow#5 and 6 in foods, MSG food additives, sudiium lauryl sulates, hot peppers, spearmint, wintergreen peppermint, mentol, orange, jalapenos. ---headache, breathing, welps, canker sores inside mouth, uticaria Hives on back and looked like sun Dust, mold, feathers, wool, flannel, cologne, chlorox, household cleaning products, scented candles, cinnamon, ivory soap, paints, sun sentitive, acidic fruits, ( lemons, limes, strawbery, tartrazine yellow#5 and 6 in foods, MSG food additives, sudiium lauryl  sulates, hot peppers, spearmint, wintergreen peppermint, mentol, orange, jalapenos. ---headache, breathing, welps, canker sores inside mouth, uticaria   Penicillins Anaphylaxis   Quetiapine Anaphylaxis    Other reaction(s): Other (See Comments) Bad dreams, too sedating. extreme fatigue and bad dreams extreme fatigue and bad dreams Bad dreams, too sedating.   Statins Hives and Anaphylaxis    Other reaction(s): Other (See Comments) Paranoid affect, muscle spasms.  Paranoid affect, muscle spasms.     Sumatriptan Anaphylaxis and Other (See Comments)   Thioridazine Anaphylaxis   Topamax Anaphylaxis and Other (See Comments)    Confusion, tremor, blurred vision   Topiramate Anaphylaxis    Other reaction(s): Other (See Comments) Confusion, tremor, blurred vision Confusion, tremor, blurred vision    Trifluoperazine Anaphylaxis    Other reaction(s): Other (See Comments) Stiffened muscles Stiffened muscles    Aripiprazole Other (See Comments)    Stiffened muscles   Dalmane [Flurazepam Hcl] Other (See Comments)    Stiffened all muscles   Darifenacin Hydrobromide Er Hives    Hives on back and looked like sunburn   Metoclopramide Hcl Other (See Comments)    headache   Seroquel [Quetiapine Fumerate] Other (See Comments)    extreme fatigue and bad dreams   Stelazine Other (See Comments)    Stiffened muscles   Thorazine [Chlorpromazine Hcl] Other (See Comments)    Stiffened muscles   Allopurinol Hives and Other (See Comments)   Aspirin Effervescent Other (See Comments)    Mouth ulcers, swallowing problems.  Mouth ulcers, swallowing problems.  Mouth ulcers, swallowing problems.     Barium Sulfate Itching and Other (See Comments)   Benzocaine Other (See Comments)    orthicoat sprays containing menthol or lemon flavor--breaks inside of mouth out, red rash, mouth ulcers.  Other reaction(s): Other (See Comments) orthicoat sprays containing menthol or lemon flavor--breaks inside of mouth  out, red rash, mouth ulcers.  orthicoat sprays containing menthol or lemon flavor--breaks inside of mouth out, red rash, mouth ulcers.    Biofreeze [Menthol (Topical Analgesic)] Hives and Itching   Camphor-Menthol     Other reaction(s): Not available   Capsaicin Itching    Redness  Redness Redness    Capsaicin-Menthol Other (See Comments)   Clindamycin Itching and Other (See Comments)   Clindamycin/Lincomycin     Bad reflux and loose stools    Clove Oil Itching and Other (See Comments)   Colchicine Hives and Other (See Comments)   Cycloheximide Itching and Other (See Comments)   Darifenacin Hives   Diatrizoate Itching    welps  Other reaction(s): Other (See Comments) welps  welps  Other reaction(s): Not available   Diflucan [Fluconazole] Hives and Itching   Erythromycin Other (See Comments)    Other reaction(s): Unknown   Garlic Itching and Other (See Comments)    Mouth swelling   Iohexol      Code: HIVES, Desc: pt broke out in red rash and hives after CT injection on 12/07/09.-pt needs 13 hr prep kit, Onset Date: 16109604  Other reaction(s): Other (See Comments)  Code: HIVES, Desc: pt broke out  in red rash and hives after CT injection on 12/07/09.-pt needs 13 hr prep kit, Onset Date: [id number]  Code: HIVES, Desc: pt broke out in red rash and hives after CT injection on 12/07/09.-pt needs 13 hr prep kit, Onset Date: [id number]   Ivp Dye [Iodinated Contrast Media]    Metronidazole Other (See Comments)   Mirabegron     swelling Other reaction(s): Other (See Comments) swelling swelling   Miralax [Polyethylene Glycol] Nausea And Vomiting   Moxifloxacin Itching    Other reaction(s): Not available   Nsaids     Mouth ulcers, swelling of mouth.    Oxybutynin Diarrhea   Polyethylene Glycol 3350 Itching   Potassium Chloride Itching   Potassium-Containing Compounds     Undiluted through IV. --Pain.    Prednisone Itching and Other (See Comments)    No  Sleep at night.  Other  reaction(s): Insomnia, Other (See Comments) No  Sleep at night.  No  Sleep at night.   Other reaction(s): insomnia   Psyllium Other (See Comments)    Mouth ulcers, swallowing problems. Other reaction(s): Other (See Comments) Mouth ulcers, swallowing problems. Mouth ulcers, swallowing problems.    Red Dye #40 (Allura Red)     Other reaction(s): Unknown   Seroquel [Quetiapine Fumarate]     Bad dreams, too sedating.   Simvastatin Other (See Comments)    Paranoid affect, muscle spasms.    Urocit - K [Potassium Citrate]     Breaks mouth out   Avelox [Moxifloxacin Hcl In Nacl] Nausea And Vomiting   Barium-Containing Compounds Rash   Butalbital-Aspirin-Caffeine Itching, Rash and Other (See Comments)    Welps, Welps, Welps,    Diclofenac Rash and Other (See Comments)   Doxycycline Nausea Only and Other (See Comments)    Stomach upset.  Stomach upset.  Stomach upset.    E-Mycin [Erythromycin Base] Nausea Only   Flagyl [Metronidazole Hcl] Nausea And Vomiting   Lamotrigine Rash and Other (See Comments)   Pregabalin     Blurry vision, muscle stiffness    Pregabalin Other (See Comments)    Other reaction(s): Other (See Comments) Blurry vision, muscle stiffness  Blurry vision, muscle stiffness     Risperidone Other (See Comments)    Too sedating   Risperidone Other (See Comments)    Too sedating.  Other reaction(s): Other (See Comments) Too sedating.  Too sedating Too sedating Too sedating.     Toradol [Ketorolac Tromethamine] Itching and Rash    HOME MEDICATIONS: Outpatient Medications Prior to Visit  Medication Sig Dispense Refill   acetaminophen (TYLENOL) 500 MG tablet Take 500 mg by mouth every 6 (six) hours as needed for moderate pain.     almotriptan (AXERT) 12.5 MG tablet Take 1 tablet (12.5 mg total) by mouth as needed for migraine. may repeat in 2 hours if needed 24 tablet 1   bisacodyl (DULCOLAX) 5 MG EC tablet Take 10 mg by mouth daily as needed for mild  constipation.      cyclobenzaprine (FLEXERIL) 10 MG tablet Take 1 tablet (10 mg total) by mouth 3 (three) times daily. 90 tablet 4   DM-APAP-CPM (CORICIDIN HBP FLU PO) Take 1 tablet by mouth every 8 (eight) hours as needed.     docusate sodium (COLACE) 100 MG capsule Take 100 mg by mouth daily as needed for mild constipation.      EPINEPHrine 0.3 mg/0.3 mL IJ SOAJ injection Inject 0.3 mg into the muscle as needed for anaphylaxis. 2 each 2   fexofenadine (  ALLEGRA) 180 MG tablet Take 1 tablet (180 mg total) by mouth daily. 30 tablet 0   furosemide (LASIX) 20 MG tablet Take 1 tablet (20 mg total) by mouth daily as needed for edema. Please keep upcoming Dec appt for further refills. Thank you 90 tablet 0   gabapentin (NEURONTIN) 300 MG capsule Take 2 capsules by mouth at bedtime.     HYDROcodone-acetaminophen (NORCO/VICODIN) 5-325 MG tablet Take 1 tablet by mouth every 8 (eight) hours as needed for moderate pain (pain score 4-6). 80 tablet 0   Loperamide HCl (IMODIUM A-D PO)      LORazepam (ATIVAN) 1 MG tablet Take 1 tablet by mouth 3 (three) times daily.     promethazine (PHENERGAN) 25 MG tablet TAKE 1/2 TABLET BY MOUTH EVERY 6 HOURS AS NEEDED FOR NAUSEA OR VOMITING 15 tablet 0   Trospium Chloride 60 MG CP24 Take 1 capsule by mouth every morning.      VENTOLIN HFA 108 (90 Base) MCG/ACT inhaler INHALE 2 PUFFS INTO THE LUNGS EVERY 6 HOURS AS NEEDED FOR WHEEZE 18 each 1   hyoscyamine (ANASPAZ) 0.125 MG TBDP disintergrating tablet hyoscyamine 0.125 mg disintegrating tablet  TAKE 1 TABLET BY MOUTH 4 TIMES DAILY AS NEEDED FOR UP TO 60 DOSES (CRAMPING, PAIN) (Patient not taking: Reported on 08/14/2023)     Magnesium Hydroxide (DULCOLAX PO) 1 as needed     No facility-administered medications prior to visit.    PAST MEDICAL HISTORY: Past Medical History:  Diagnosis Date   Anxiety    Arthritis    Asthma    Bipolar 2 disorder (HCC)    pt stated, "I have Bipolar 2 and it is remission"   Bipolar  affective (HCC)    CAD (coronary artery disease), native artery transplanted heart    Nonobstructive by coronary CTA 08/2021 with less than 25% stenosis in the LAD, RCA and left circumflex.   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   Full dentures    Gout    Headache(784.0)    migraines   Herpesviral infection    Hypertension    IBS (irritable bowel syndrome)    with diarrhea   Lymphadenopathy    Myalgia and myositis, unspecified    PAT (paroxysmal atrial tachycardia) (HCC)    asymptomatic 10 beat run on event monitor 09/2020   PFO (patent foramen ovale)    Small PFO noted on coronary CTA   Pneumonia    PONV (postoperative nausea and vomiting)    PTSD (post-traumatic stress disorder)    Renal calculi    Restless legs syndrome (RLS)    Thoracic radiculopathy    Vitamin D deficiency    Wears glasses     PAST SURGICAL HISTORY: Past Surgical History:  Procedure Laterality Date    kidney stones     ABDOMINAL ADHESION SURGERY     ABDOMINAL HYSTERECTOMY  1995   partial- hemmoraged after surgery-stitch came loose   APPENDECTOMY  1993   hemmoraged after surgery- stitch came loose   CERVICAL DISC SURGERY  06/04/2001   with fusion   CHOLECYSTECTOMY  1985   colonoscopy     COLONOSCOPY     CYSTOSCOPY     FOOT SURGERY  lt   INCISIONAL HERNIA REPAIR N/A 04/22/2021   Procedure: LAPAROSCOPIC INCISIONAL HERNIA REPAIR WITH MESH;  Surgeon: Berna Bue, MD;  Location: [facility];  Service: General;  Laterality: N/A;   jj stent  07/2002   with ureteroscopy, cystoscopy and then removal of stent in office   LAPAROSCOPIC LYSIS OF ADHESIONS N/A 04/22/2021   Procedure: LYSIS OF ADHESIONS;  Surgeon: Berna Bue, MD;  Location: [facility];  Service:  General;  Laterality: N/A;   LITHOTRIPSY     LYMPH NODE BIOPSY Right 03/06/2014   Procedure: right groin LYMPH NODE BIOPSY;  Surgeon: Robyne Askew, MD;  Location: [facility];  Service: General;  Laterality: Right;   LYMPHADENECTOMY N/A 12/29/2015   Procedure: RETROPERITONEAL LYMPHADENECTOMY;  Surgeon: Sebastian Ache, MD;  Location: [facility];  Service: Urology;  Laterality: N/A;   NECK SURGERY  2002   ROBOT ASSISTED LAPAROSCOPIC NEPHRECTOMY Left 12/29/2015   Procedure: XI ROBOTIC ASSISTED LEFT LAPAROSCOPIC NEPHRECTOMY;  Surgeon: Sebastian Ache, MD;  Location: [facility];  Service: Urology;  Laterality: Left;   TONSILLECTOMY  1976    FAMILY HISTORY: Family History  Problem Relation Age of Onset   Cancer Mother        stomach   Migraines Mother    Arthritis Mother    Emphysema Mother    Heart disease Father    Hypertension Father    Cancer Father        colon   Dementia Father    Hypertension Brother    Migraines Brother    Hypertension Brother    Migraines Brother    Alcohol abuse Brother    Bipolar disorder Brother    Migraines Daughter    Arthritis Maternal Grandmother    Cancer Maternal Grandmother        stomach   Diabetes Maternal Grandmother    Stroke Maternal Grandmother    Cancer Maternal Aunt        lung   Emphysema Maternal Aunt    Cancer Paternal Uncle        colon   Hypertension Maternal Aunt    Heart disease Maternal Aunt    Cancer Paternal Uncle        lung    SOCIAL HISTORY: Social History   Socioeconomic History   Marital status: Married    Spouse name: Not on file   Number of children: 1   Years of education: COLLEGE1   Highest education level: Not on file  Occupational History   Occupation: HOUSEWIFE    Employer: UNEMPLOYED  Tobacco Use   Smoking status: Never   Smokeless tobacco: Never  Vaping Use   Vaping status: Never Used  Substance and Sexual Activity   Alcohol use: No   Drug use: No   Sexual activity: Not on file   Other Topics Concern   Not on file  Social History Narrative   Patient is right handed.   Patient drinks caffeine occasionally.   Social Determinants of Health   Financial Resource Strain: Low Risk  (07/31/2023)   Overall Financial Resource Strain (CARDIA)    Difficulty of Paying Living Expenses: Not hard at all  Food Insecurity: No Food Insecurity (07/31/2023)   Hunger Vital Sign    Worried About Running Out of Food in the Last Year: Never true    Ran Out of Food in the Last Year: Never true  Transportation Needs: No Transportation Needs (07/31/2023)   PRAPARE - Transportation    Lack of  Transportation (Medical): No    Lack of Transportation (Non-Medical): No  Physical Activity: Inactive (07/31/2023)   Exercise Vital Sign    Days of Exercise per Week: 0 days    Minutes of Exercise per Session: 0 min  Stress: Stress Concern Present (07/31/2023)   [employer] of Occupational Health - Occupational Stress Questionnaire    Feeling of Stress : To some extent  Social Connections: Moderately Integrated (07/31/2023)   Social Connection and Isolation Panel [NHANES]    Frequency of Communication with Friends and Family: More than three times a week    Frequency of Social Gatherings with Friends and Family: More than three times a week    Attends Religious Services: More than 4 times per year    Active Member of [employer] or Organizations: No    Attends Banker Meetings: Never    Marital Status: Married  Catering manager Violence: Not At Risk (07/31/2023)   Humiliation, Afraid, Rape, and Kick questionnaire    Fear of Current or Ex-Partner: No    Emotionally Abused: No    Physically Abused: No    Sexually Abused: No    PHYSICAL EXAM  Vitals:   08/14/23 1017  BP: 132/76  Pulse: 95  Weight: 183 lb 6.4 oz (83.2 kg)  Height: 5' 5.5" (1.664 m)   Body mass index is 30.06 kg/m.  Generalized: Well developed, in no acute distress  Neurological examination   Mentation: Alert oriented to time, place, history taking. Follows all commands speech and language fluent. Thoughts are scattered.  Cranial nerve II-XII: Pupils were equal round reactive to light. Extraocular movements were full, visual field were full on confrontational test. Facial sensation and strength were normal. . Head turning and shoulder shrug  were normal and symmetric. Motor: Good strength overall, wearing braces to both wrists. Sensory: Sensory testing is intact to soft touch on all 4 extremities. No evidence of extinction is noted.  Coordination: Cerebellar testing reveals good finger-nose-finger and heel-to-shin bilaterally.  Gait and station: Gait is wide-based cautious, uses rolling walker Reflexes: Deep tendon reflexes are symmetric and normal bilaterally.   DIAGNOSTIC DATA (LABS, IMAGING, TESTING) - I reviewed patient records, labs, notes, testing and imaging myself where available.  Lab Results  Component Value Date   WBC 5.4 03/30/2022   HGB 12.2 03/30/2022   HCT 37.2 03/30/2022   MCV 95.6 03/30/2022   PLT 282 03/30/2022      Component Value Date/Time   NA 141 08/10/2023 1024   NA 140 12/01/2014 1039   K 3.9 08/10/2023 1024   K 2.8 (LL) 12/01/2014 1039   CL 97 08/10/2023 1024   CO2 27 08/10/2023 1024   CO2 30 (H) 12/01/2014 1039   GLUCOSE 81 08/10/2023 1024   GLUCOSE 85 03/30/2022 0836   GLUCOSE 143 (H) 12/01/2014 1039   BUN 12 08/10/2023 1024   BUN 14.3 12/01/2014 1039   CREATININE 0.78 08/10/2023 1024   CREATININE 0.8 12/01/2014 1039   CALCIUM 10.1 08/10/2023 1024   CALCIUM 10.0 12/01/2014 1039   PROT 8.0 03/30/2022 0836   PROT 7.4 03/24/2020 1000   PROT 8.4 (H) 12/01/2014 1039   ALBUMIN 4.4 03/30/2022 0836   ALBUMIN 4.3 03/24/2020 1000   ALBUMIN 4.1 12/01/2014 1039   AST 28 03/30/2022 0836   AST 23 12/01/2014 1039   ALT 14 03/30/2022 0836   ALT 14 12/01/2014 1039   ALKPHOS 73 03/30/2022 0836   ALKPHOS 71 12/01/2014 1039   BILITOT 0.4  03/30/2022 0836  BILITOT 0.4 03/24/2020 1000   BILITOT 0.22 12/01/2014 1039   GFRNONAA >60 03/30/2022 0836   GFRAA 105 03/24/2020 1000   Lab Results  Component Value Date   CHOL 276 (H) 08/10/2023   HDL 57 08/10/2023   LDLCALC 177 (H) 08/10/2023   TRIG 222 (H) 08/10/2023   CHOLHDL 4.8 (H) 08/10/2023   Lab Results  Component Value Date   HGBA1C 5.7 (H) 08/28/2017   Lab Results  Component Value Date   VITAMINB12 299 08/28/2017   Lab Results  Component Value Date   TSH 1.010 03/24/2020    Margie Ege, AGNP-C, DNP 08/14/2023, 10:25 AM [facility] [address] [place], [place] [address] [phone]

## 2023-08-14 ENCOUNTER — Ambulatory Visit (INDEPENDENT_AMBULATORY_CARE_PROVIDER_SITE_OTHER): Payer: Medicare Other | Admitting: Neurology

## 2023-08-14 ENCOUNTER — Telehealth: Payer: Self-pay | Admitting: Neurology

## 2023-08-14 ENCOUNTER — Encounter: Payer: Self-pay | Admitting: Neurology

## 2023-08-14 VITALS — BP 132/76 | HR 95 | Ht 65.5 in | Wt 183.4 lb

## 2023-08-14 DIAGNOSIS — G43519 Persistent migraine aura without cerebral infarction, intractable, without status migrainosus: Secondary | ICD-10-CM | POA: Diagnosis not present

## 2023-08-14 MED ORDER — UBRELVY 100 MG PO TABS: 100.0000 mg | ORAL_TABLET | ORAL | 11 refills | Status: DC | PRN

## 2023-08-14 MED ORDER — QULIPTA 60 MG PO TABS: 60.0000 mg | ORAL_TABLET | Freq: Every day | ORAL | 11 refills | Status: DC

## 2023-08-14 MED ORDER — UBRELVY 100 MG PO TABS: ORAL_TABLET | ORAL | 0 refills | Status: DC

## 2023-08-14 NOTE — Telephone Encounter (Signed)
Order sent to [facility], they will contact pt to schedule. [phone]

## 2023-08-14 NOTE — Patient Instructions (Addendum)
Check MRI brain  Try Qulipta once daily for migraine prevention  Try Ubrelvy at onset of migraine headache (TRY TAKING 1/2 TABLET AT ONSET of migraine)  (Take 1 tablet at onset of headache, may repeat in 2 hours if needed. Max is 200 mg in 24 hours) Will continue the phenergan, may use Axert as needed

## 2023-08-15 ENCOUNTER — Ambulatory Visit: Payer: Medicare Other | Admitting: Family Medicine

## 2023-08-17 ENCOUNTER — Ambulatory Visit: Payer: Self-pay | Admitting: Licensed Clinical Social Worker

## 2023-08-17 NOTE — Patient Outreach (Signed)
  Care Coordination   Initial Visit Note   08/17/2023 Name: Patty Salas MRN: [id number] DOB: 1956-05-24  Patty Salas is a 67 y.o. year old female who sees Sandre Kitty, MD for primary care. I spoke with  Auburn Bilberry by phone today.  What matters to the patients health and wellness today?  Transportation needs in the future    Goals Addressed             This Visit's Progress    Care Coordination Activities       Care Coordination Interventions: Patient stated that she suffers from migraines and is under doctors care Patient stated that she does drive and has a car but feels as if she may need some transportation assistance in the future Patient stated that she wanted the SW to completed the SCAT on the follow telephone visit on 09/11/2023, but then stated to the SW that she can go ahead and submit the application, the SW will start the application and the submit the Part B to [name] not the PCP, because the patient stated that [name] was the doctor that talked to her about the SCAT transportation program. The SW went over the SDOH needs and the patient did not have any other needs.         SDOH assessments and interventions completed:  Yes  SDOH Interventions Today    Flowsheet Row Most Recent Value  SDOH Interventions   Food Insecurity Interventions Intervention Not Indicated  Housing Interventions Intervention Not Indicated  Transportation Interventions SCAT (Specialized Community Area Transporation)  [Patient changed her mind and wants the Sw to go ahead and do the SCAT application]  Utilities Interventions Intervention Not Indicated        Care Coordination Interventions:  Yes, provided  Interventions Today    Flowsheet Row Most Recent Value  General Interventions   General Interventions Discussed/Reviewed General Interventions Discussed, [facility], Doctor Visits  [Patient may be interested in SCAT but wants the SW to follow up with her  the first week in December, during the telephone the patient wants the SW to submit the SCAT Application]        Follow up plan: Follow up call scheduled for 09/11/2023 at 11:00 am    Encounter Outcome:  Patient Visit Completed   Jeanie Cooks, PhD [facility], [facility] Social Worker Direct Dial: [phone]  Fax: [phone]

## 2023-08-17 NOTE — Patient Instructions (Signed)
Visit Information  Thank you for taking time to visit with me today. Please don't hesitate to contact me if I can be of assistance to you.   Following are the goals we discussed today:   Goals Addressed             This Visit's Progress    Care Coordination Activities       Care Coordination Interventions: Patient stated that she suffers from migraines and is under doctors care Patient stated that she does drive and has a car but feels as if she may need some transportation assistance in the future Patient stated that she wanted the SW to completed the SCAT on the follow telephone visit on 09/11/2023, but then stated to the SW that she can go ahead and submit the application, the SW will start the application and the submit the Part B to [name] not the PCP, because the patient stated that [name] was the doctor that talked to her about the SCAT transportation program. The SW went over the SDOH needs and the patient did not have any other needs.         Our next appointment is by telephone on 09/11/2023 at 09/11/2023 at 11:00 am  Please call the care guide team at [phone] if you need to cancel or reschedule your appointment.   If you are experiencing a Mental Health or Behavioral Health Crisis or need someone to talk to, please call the Suicide and Crisis Lifeline: 988 go to [facility] Urgent [facility] [address] [place] [phone]) call 911  Patient verbalizes understanding of instructions and care plan provided today and agrees to view in MyChart. Active MyChart status and patient understanding of how to access instructions and care plan via MyChart confirmed with patient.     Jeanie Cooks, PhD [facility], [facility] Social Worker Direct Dial: [phone]  Fax: [phone]

## 2023-08-20 ENCOUNTER — Encounter: Payer: Self-pay | Admitting: Neurology

## 2023-08-20 ENCOUNTER — Other Ambulatory Visit: Payer: Self-pay

## 2023-08-20 ENCOUNTER — Emergency Department (HOSPITAL_COMMUNITY): Payer: Medicare Other

## 2023-08-20 ENCOUNTER — Emergency Department (HOSPITAL_COMMUNITY)
Admission: EM | Admit: 2023-08-20 | Discharge: 2023-08-20 | Disposition: A | Discharge status: Home / Self Care | Payer: Medicare Other | Attending: Emergency Medicine | Admitting: Emergency Medicine

## 2023-08-20 DIAGNOSIS — S40012A Contusion of left shoulder, initial encounter: Secondary | ICD-10-CM | POA: Diagnosis not present

## 2023-08-20 DIAGNOSIS — S80212A Abrasion, left knee, initial encounter: Secondary | ICD-10-CM | POA: Insufficient documentation

## 2023-08-20 DIAGNOSIS — W19XXXA Unspecified fall, initial encounter: Secondary | ICD-10-CM

## 2023-08-20 DIAGNOSIS — W1830XA Fall on same level, unspecified, initial encounter: Secondary | ICD-10-CM | POA: Insufficient documentation

## 2023-08-20 DIAGNOSIS — S0012XA Contusion of left eyelid and periocular area, initial encounter: Secondary | ICD-10-CM | POA: Diagnosis not present

## 2023-08-20 DIAGNOSIS — R519 Headache, unspecified: Secondary | ICD-10-CM | POA: Diagnosis present

## 2023-08-20 DIAGNOSIS — S5002XA Contusion of left elbow, initial encounter: Secondary | ICD-10-CM | POA: Diagnosis not present

## 2023-08-20 LAB — CBC WITH DIFFERENTIAL/PLATELET
Abs Immature Granulocytes: 0.02 10*3/uL (ref 0.00–0.07)
Basophils Absolute: 0.1 10*3/uL (ref 0.0–0.1)
Basophils Relative: 1 %
Eosinophils Absolute: 0.1 10*3/uL (ref 0.0–0.5)
Eosinophils Relative: 3 %
HCT: 38.3 % (ref 36.0–46.0)
Hemoglobin: 12.4 g/dL (ref 12.0–15.0)
Immature Granulocytes: 0 %
Lymphocytes Relative: 18 %
Lymphs Abs: 0.9 10*3/uL (ref 0.7–4.0)
MCH: 31.2 pg (ref 26.0–34.0)
MCHC: 32.4 g/dL (ref 30.0–36.0)
MCV: 96.2 fL (ref 80.0–100.0)
Monocytes Absolute: 0.4 10*3/uL (ref 0.1–1.0)
Monocytes Relative: 9 %
Neutro Abs: 3.3 10*3/uL (ref 1.7–7.7)
Neutrophils Relative %: 69 %
Platelets: 312 10*3/uL (ref 150–400)
RBC: 3.98 MIL/uL (ref 3.87–5.11)
RDW: 12.5 % (ref 11.5–15.5)
WBC: 4.8 10*3/uL (ref 4.0–10.5)
nRBC: 0 % (ref 0.0–0.2)

## 2023-08-20 LAB — BASIC METABOLIC PANEL
Anion gap: 10 (ref 5–15)
BUN: 9 mg/dL (ref 8–23)
CO2: 28 mmol/L (ref 22–32)
Calcium: 9.6 mg/dL (ref 8.9–10.3)
Chloride: 101 mmol/L (ref 98–111)
Creatinine, Ser: 0.74 mg/dL (ref 0.44–1.00)
GFR, Estimated: >60 mL/min (ref >60)
Glucose, Bld: 96 mg/dL (ref 70–99)
Potassium: 3.7 mmol/L (ref 3.5–5.1)
Sodium: 139 mmol/L (ref 135–145)

## 2023-08-20 MED ORDER — ACETAMINOPHEN 325 MG PO TABS
650.0000 mg | ORAL_TABLET | Freq: Once | ORAL | Status: AC
  Administered 2023-08-20: 650 mg via ORAL
  Filled 2023-08-20: qty 2

## 2023-08-20 MED ORDER — PROMETHAZINE HCL 25 MG/ML IJ SOLN
12.5000 mg | Freq: Once | INTRAMUSCULAR | Status: AC
  Administered 2023-08-20: 12.5 mg via INTRAMUSCULAR
  Filled 2023-08-20: qty 1

## 2023-08-20 NOTE — Discharge Instructions (Addendum)
Please return to the ER if you have severe headache, dizziness, new numbness in your arms or legs, new weakness.

## 2023-08-20 NOTE — ED Provider Notes (Signed)
Patty Salas Provider Note   CSN: [id number] Arrival date & time: 08/20/23  1056     History  Chief Complaint  Patient presents with   Patty Salas    Patty Salas is a 67 y.o. female. Patient is a 67 year old female with a PMH of bipolar disorder, chronic migraine, vertigo, fibromyalgia, prior renal cell carcinoma who presented to the ED for a fall 6 days ago. Patient reports she was getting out of her car in the [facility] parking lot, when she got out too fast and fell. Reports she landed on her left side, hitting her head, shoulder, hip and knee. Endorses loss of consciousness and states someone carried her into the [facility] to be evaluated by the pharmacist. States she sat in a chair in the [facility] until she was able to do a crossword puzzle, at which point she felt comfortable driving home. States she has been ambulatory at home with her cane. Reports she has not yet taken her migraine medication today and feels very nauseous, but denies vomiting. Otherwise denies fevers, cough, congestion, chest pain, shortness of breath, abdominal pain.    Fall       Home Medications Prior to Admission medications   Medication Sig Start Date End Date Taking? Authorizing Provider  acetaminophen (TYLENOL) 500 MG tablet Take 500 mg by mouth every 6 (six) hours as needed for moderate pain.    [provider]  almotriptan (AXERT) 12.5 MG tablet Take 1 tablet (12.5 mg total) by mouth as needed for migraine. may repeat in 2 hours if needed 07/05/23   Glean Salvo, NP  Atogepant (QULIPTA) 60 MG TABS Take 1 tablet (60 mg total) by mouth daily. 08/14/23   Glean Salvo, NP  bisacodyl (DULCOLAX) 5 MG EC tablet Take 10 mg by mouth daily as needed for mild constipation.     [provider]  cyclobenzaprine (FLEXERIL) 10 MG tablet Take 1 tablet (10 mg total) by mouth 3 (three) times daily. 06/12/23   Jones Bales, NP  DM-APAP-CPM (CORICIDIN  HBP FLU PO) Take 1 tablet by mouth every 8 (eight) hours as needed.    [provider]  docusate sodium (COLACE) 100 MG capsule Take 100 mg by mouth daily as needed for mild constipation.     [provider]  EPINEPHrine 0.3 mg/0.3 mL IJ SOAJ injection Inject 0.3 mg into the muscle as needed for anaphylaxis. 06/15/23   Sandre Kitty, MD  fexofenadine (ALLEGRA) 180 MG tablet Take 1 tablet (180 mg total) by mouth daily. 11/12/18   Opalski, Gavin Pound, DO  furosemide (LASIX) 20 MG tablet Take 1 tablet (20 mg total) by mouth daily as needed for edema. Please keep upcoming Dec appt for further refills. Thank you 06/25/23   Quintella Reichert, MD  gabapentin (NEURONTIN) 300 MG capsule Take 2 capsules by mouth at bedtime.    [provider]  HYDROcodone-acetaminophen (NORCO/VICODIN) 5-325 MG tablet Take 1 tablet by mouth every 8 (eight) hours as needed for moderate pain (pain score 4-6). 08/13/23   Jones Bales, NP  Loperamide HCl (IMODIUM A-D PO)     [provider]  LORazepam (ATIVAN) 1 MG tablet Take 1 tablet by mouth 3 (three) times daily.    [provider]  promethazine (PHENERGAN) 25 MG tablet TAKE 1/2 TABLET BY MOUTH EVERY 6 HOURS AS NEEDED FOR NAUSEA OR VOMITING 07/31/23   Glean Salvo, NP  Trospium Chloride 60  MG CP24 Take 1 capsule by mouth every morning.  03/28/12   Jerilee Field, MD  Ubrogepant (UBRELVY) 100 MG TABS Take 1 tablet (100 mg total) by mouth as needed. Take 1 tablet at onset of headache, may repeat in 2 hours if needed. Max is 200 mg in 24 hours. 08/14/23   Glean Salvo, NP  Ubrogepant (UBRELVY) 100 MG TABS Take 1/2 tablet at onset on migraine. May repeat in 2 hours if needed 08/14/23   Glean Salvo, NP  VENTOLIN HFA 108 (90 Base) MCG/ACT inhaler INHALE 2 PUFFS INTO THE LUNGS EVERY 6 HOURS AS NEEDED FOR WHEEZE 03/29/23   Carlean Jews, NP      Allergies    Aripiprazole, Aspirin, Bromfenac sodium, Chlorpromazine, Cymbalta  [duloxetine hcl], Divalproex sodium, Duloxetine, Duract [bromfenac], Flurazepam, Hydrochlorothiazide, Imitrex [sumatriptan succinate], Latex, Mellaril, Metoclopramide, Olanzapine, Olanzapine, Other, Penicillins, Quetiapine, Statins, Sumatriptan, Thioridazine, Topamax, Topiramate, Trifluoperazine, Aripiprazole, Dalmane [flurazepam hcl], Darifenacin hydrobromide er, Metoclopramide hcl, Seroquel [quetiapine fumerate], Stelazine, Thorazine [chlorpromazine hcl], Allopurinol, Aspirin effervescent, Barium sulfate, Benzocaine, Biofreeze [menthol (topical analgesic)], Camphor-menthol, Capsaicin, Capsaicin-menthol, Clindamycin, Clindamycin/lincomycin, Clove oil, Colchicine, Cycloheximide, Darifenacin, Diatrizoate, Diflucan [fluconazole], Erythromycin, Garlic, Iohexol, Ivp dye [iodinated contrast media], Metronidazole, Mirabegron, Miralax [polyethylene glycol], Moxifloxacin, Nsaids, Oxybutynin, Polyethylene glycol 3350, Potassium chloride, Potassium-containing compounds, Prednisone, Psyllium, Red dye #40 (allura red), Seroquel [quetiapine fumarate], Simvastatin, Urocit - k [potassium citrate], Avelox [moxifloxacin hcl in nacl], Barium-containing compounds, Butalbital-aspirin-caffeine, Diclofenac, Doxycycline, E-mycin [erythromycin base], Flagyl [metronidazole hcl], Lamotrigine, Pregabalin, Pregabalin, Risperidone, Risperidone, and Toradol [ketorolac tromethamine]    Review of Systems   Review of Systems  Physical Exam Updated Vital Signs BP (!) 142/68 (BP Location: Right Arm)   Pulse 69   Temp 99.7 F (37.6 C) (Oral)   Resp 16   Ht 5' 5.5" (1.664 m)   Wt 68 kg   SpO2 99%   BMI 24.58 kg/m  Physical Exam Constitutional:      General: She is not in acute distress.    Appearance: Normal appearance. She is not ill-appearing.  HENT:     Head: Normocephalic.     Comments: Left sided periorbital ecchymosis without overlying abrasion or laceration. Pupils 5 mm, equally reactive. Extraocular movements intact  bilaterally    Mouth/Throat:     Mouth: Mucous membranes are moist.     Pharynx: Oropharynx is clear.  Neck:     Comments: No midline C, T, L spine tenderness to palpation Cardiovascular:     Pulses: Normal pulses.     Heart sounds: No murmur heard.    No friction rub. No gallop.  Pulmonary:     Effort: Pulmonary effort is normal.     Breath sounds: Normal breath sounds. No stridor. No wheezing, rhonchi or rales.  Abdominal:     Palpations: Abdomen is soft.     Tenderness: There is no abdominal tenderness. There is no guarding or rebound.  Musculoskeletal:     Right lower leg: No edema.     Left lower leg: No edema.  Skin:    General: Skin is warm and dry.     Capillary Refill: Capillary refill takes less than 2 seconds.  Neurological:     General: No focal deficit present.     Mental Status: She is alert.     Comments: Cranial nerves II-XII intact, although patient reports diminished sensation over the left face. Sensation and motor function intact in all 4 extremities. Bruising over the left shoulder, left, elbow, anterior left knee with hemostatic abrasion  ED Results / Procedures / Treatments   Labs (all labs ordered are listed, but only abnormal results are displayed) Labs Reviewed  BASIC METABOLIC PANEL  CBC WITH DIFFERENTIAL/PLATELET   EKG EKG Interpretation Date/Time:  Monday August 20 2023 11:16:50 EST Ventricular Rate:  100 PR Interval:  126 QRS Duration:  78 QT Interval:  358 QTC Calculation: 461 R Axis:   47  Text Interpretation: Normal sinus rhythm Anterior infarct , age undetermined Abnormal ECG When compared with ECG of 13-Jun-2021 12:35, PREVIOUS ECG IS PRESENT Confirmed by Blane Ohara [phone]) on 08/20/2023 2:18:09 PM  Radiology DG Pelvis 1-2 Views  Result Date: 08/20/2023 CLINICAL DATA:  Left hip pain status post fall one-week ago EXAM: PELVIS - 1 VIEW COMPARISON:  None Available. FINDINGS: There is no evidence of pelvic fracture or  diastasis. No pelvic bone lesions are seen. Surgical clips project over the right lower quadrant. Single clip is present in the midline lower pelvis. IMPRESSION: No acute fracture or dislocation. Electronically Signed   By: Agustin Cree M.D.   On: 08/20/2023 15:06   DG Chest 1 View  Result Date: 08/20/2023 CLINICAL DATA:  Shortness of breath EXAM: CHEST  1 VIEW COMPARISON:  Chest radiograph dated 06/13/2021 FINDINGS: Normal lung volumes. Left basilar patchy opacities. No pleural effusion or pneumothorax. The heart size and mediastinal contours are within normal limits. Cervical spinal fixation hardware appears intact. No radiographic finding of acute displaced fracture. Similar dextroscoliosis of the thoracic spine. Right upper quadrant surgical clips. IMPRESSION: 1. No radiographic finding of acute displaced fracture. 2. Left basilar patchy opacities may reflect atelectasis, aspiration, or pneumonia. Electronically Signed   By: Agustin Cree M.D.   On: 08/20/2023 15:04   CT Head Wo Contrast  Result Date: 08/20/2023 CLINICAL DATA:  Head trauma, moderate-severe Head trauma; Facial trauma, blunt facial trauma fall; Neck trauma (Age >= 65y) neck trauma fall. EXAM: CT HEAD WITHOUT CONTRAST CT MAXILLOFACIAL WITHOUT CONTRAST CT CERVICAL SPINE WITHOUT CONTRAST TECHNIQUE: Multidetector CT imaging of the head, cervical spine, and maxillofacial structures were performed using the standard protocol without intravenous contrast. Multiplanar CT image reconstructions of the cervical spine and maxillofacial structures were also generated. RADIATION DOSE REDUCTION: This exam was performed according to the departmental dose-optimization program which includes automated exposure control, adjustment of the mA and/or kV according to patient size and/or use of iterative reconstruction technique. COMPARISON:  Head CT 12/16/2020.  MRI cervical spine 04/10/2023. FINDINGS: CT HEAD FINDINGS Brain: No acute hemorrhage. Unchanged mild  chronic small-vessel disease. Cortical gray-white differentiation is otherwise preserved. No hydrocephalus or extra-axial collection. No mass effect or midline shift. Vascular: No hyperdense vessel or unexpected calcification. Skull: No calvarial fracture or suspicious bone lesion. Skull base is unremarkable. Other: None. CT MAXILLOFACIAL FINDINGS Osseous: No fracture or mandibular dislocation. No destructive process. Orbits: Negative. No traumatic or inflammatory finding. Sinuses: Clear. Soft tissues: Small contusion along the left cheek. CT CERVICAL SPINE FINDINGS Alignment: Unchanged 3 mm anterolisthesis of C4 on C5. No traumatic malalignment. Skull base and vertebrae: Prior C5-C7 ACDF. Hardware is intact. No surrounding lucency or associated fracture. Solid bony fusion across the intervening disc spaces. Soft tissues and spinal canal: No prevertebral fluid or swelling. No visible canal hematoma. Disc levels: Mild adjacent segment degeneration at C4-5 and C7-T1, where there is no more than mild spinal canal stenosis. Upper chest: Unremarkable. Other: None. IMPRESSION: 1. No acute intracranial abnormality. 2. No acute facial bone fracture. Small contusion along the left cheek. 3. No acute fracture or  traumatic malalignment of the cervical spine. 4. Prior C5-C7 ACDF without evidence of hardware complication. Electronically Signed   By: Orvan Falconer M.D.   On: 08/20/2023 13:44   CT Cervical Spine Wo Contrast  Result Date: 08/20/2023 CLINICAL DATA:  Head trauma, moderate-severe Head trauma; Facial trauma, blunt facial trauma fall; Neck trauma (Age >= 65y) neck trauma fall. EXAM: CT HEAD WITHOUT CONTRAST CT MAXILLOFACIAL WITHOUT CONTRAST CT CERVICAL SPINE WITHOUT CONTRAST TECHNIQUE: Multidetector CT imaging of the head, cervical spine, and maxillofacial structures were performed using the standard protocol without intravenous contrast. Multiplanar CT image reconstructions of the cervical spine and  maxillofacial structures were also generated. RADIATION DOSE REDUCTION: This exam was performed according to the departmental dose-optimization program which includes automated exposure control, adjustment of the mA and/or kV according to patient size and/or use of iterative reconstruction technique. COMPARISON:  Head CT 12/16/2020.  MRI cervical spine 04/10/2023. FINDINGS: CT HEAD FINDINGS Brain: No acute hemorrhage. Unchanged mild chronic small-vessel disease. Cortical gray-white differentiation is otherwise preserved. No hydrocephalus or extra-axial collection. No mass effect or midline shift. Vascular: No hyperdense vessel or unexpected calcification. Skull: No calvarial fracture or suspicious bone lesion. Skull base is unremarkable. Other: None. CT MAXILLOFACIAL FINDINGS Osseous: No fracture or mandibular dislocation. No destructive process. Orbits: Negative. No traumatic or inflammatory finding. Sinuses: Clear. Soft tissues: Small contusion along the left cheek. CT CERVICAL SPINE FINDINGS Alignment: Unchanged 3 mm anterolisthesis of C4 on C5. No traumatic malalignment. Skull base and vertebrae: Prior C5-C7 ACDF. Hardware is intact. No surrounding lucency or associated fracture. Solid bony fusion across the intervening disc spaces. Soft tissues and spinal canal: No prevertebral fluid or swelling. No visible canal hematoma. Disc levels: Mild adjacent segment degeneration at C4-5 and C7-T1, where there is no more than mild spinal canal stenosis. Upper chest: Unremarkable. Other: None. IMPRESSION: 1. No acute intracranial abnormality. 2. No acute facial bone fracture. Small contusion along the left cheek. 3. No acute fracture or traumatic malalignment of the cervical spine. 4. Prior C5-C7 ACDF without evidence of hardware complication. Electronically Signed   By: Orvan Falconer M.D.   On: 08/20/2023 13:44   CT Maxillofacial Wo Contrast  Result Date: 08/20/2023 CLINICAL DATA:  Head trauma, moderate-severe  Head trauma; Facial trauma, blunt facial trauma fall; Neck trauma (Age >= 65y) neck trauma fall. EXAM: CT HEAD WITHOUT CONTRAST CT MAXILLOFACIAL WITHOUT CONTRAST CT CERVICAL SPINE WITHOUT CONTRAST TECHNIQUE: Multidetector CT imaging of the head, cervical spine, and maxillofacial structures were performed using the standard protocol without intravenous contrast. Multiplanar CT image reconstructions of the cervical spine and maxillofacial structures were also generated. RADIATION DOSE REDUCTION: This exam was performed according to the departmental dose-optimization program which includes automated exposure control, adjustment of the mA and/or kV according to patient size and/or use of iterative reconstruction technique. COMPARISON:  Head CT 12/16/2020.  MRI cervical spine 04/10/2023. FINDINGS: CT HEAD FINDINGS Brain: No acute hemorrhage. Unchanged mild chronic small-vessel disease. Cortical gray-white differentiation is otherwise preserved. No hydrocephalus or extra-axial collection. No mass effect or midline shift. Vascular: No hyperdense vessel or unexpected calcification. Skull: No calvarial fracture or suspicious bone lesion. Skull base is unremarkable. Other: None. CT MAXILLOFACIAL FINDINGS Osseous: No fracture or mandibular dislocation. No destructive process. Orbits: Negative. No traumatic or inflammatory finding. Sinuses: Clear. Soft tissues: Small contusion along the left cheek. CT CERVICAL SPINE FINDINGS Alignment: Unchanged 3 mm anterolisthesis of C4 on C5. No traumatic malalignment. Skull base and vertebrae: Prior C5-C7 ACDF. Hardware is intact. No  surrounding lucency or associated fracture. Solid bony fusion across the intervening disc spaces. Soft tissues and spinal canal: No prevertebral fluid or swelling. No visible canal hematoma. Disc levels: Mild adjacent segment degeneration at C4-5 and C7-T1, where there is no more than mild spinal canal stenosis. Upper chest: Unremarkable. Other: None.  IMPRESSION: 1. No acute intracranial abnormality. 2. No acute facial bone fracture. Small contusion along the left cheek. 3. No acute fracture or traumatic malalignment of the cervical spine. 4. Prior C5-C7 ACDF without evidence of hardware complication. Electronically Signed   By: Orvan Falconer M.D.   On: 08/20/2023 13:44    Procedures Procedures    Medications Ordered in ED Medications  promethazine (PHENERGAN) injection 12.5 mg (12.5 mg Intramuscular Given 08/20/23 1426)  acetaminophen (TYLENOL) tablet 650 mg (650 mg Oral Given 08/20/23 1614)    ED Course/ Medical Decision Making/ A&P Clinical Course as of 08/20/23 1658  Mon Aug 20, 2023  1545 S Fall x6 days ago, [employer] face forward in [place] parking lot +LOC Here today w/ L periorbital bruising CT imaging negative X-rays pending Can d/c once x-rays read [JR]    Clinical Course User Index [JR] Rolla Flatten, MD                                 Medical Decision Making Amount and/or Complexity of Data Reviewed Radiology: ordered.  Risk OTC drugs. Prescription drug management.   CT scans were obtained to evaluate for head, neck, and facial trauma, which were negative.  X-rays were ordered to evaluate for trunk and extremity trauma.  These were pending at signout.  Patient was administered Phenergan for nausea and Tylenol for pain.  She was signed out to the oncoming physician in stable condition, pending results of x-rays.        Final Clinical Impression(s) / ED Diagnoses Final diagnoses:  None    Rx / DC Orders ED Discharge Orders     None         Janyth Pupa, MD 08/20/23 1658    Blane Ohara, MD 08/21/23 1511

## 2023-08-20 NOTE — ED Provider Triage Note (Signed)
Emergency Medicine Provider Triage Evaluation Note  Patty Salas , a 67 y.o. female  was evaluated in triage.  Pt complains of left-sided pain status post fall 6 days ago..  Mechanical fall onto left side 6 days ago.  Left-sided pain since then.  Did not seek evaluation at that time  Review of Systems  Positive: Headache left hip pain Negative: Chest pain shortness of breath abdominal pain  Physical Exam  BP (!) 154/75 (BP Location: Right Arm)   Pulse (!) 110   Temp 99.1 F (37.3 C) (Oral)   Resp 16   Ht 5' 5.5" (1.664 m)   Wt 68 kg   SpO2 99%   BMI 24.58 kg/m  Gen:   Awake, no distress Resp:  Normal effort clear to auscultation bilaterally MSK:   Moves extremities without difficulty no cervical midline tenderness Other:  Calm cooperative  Medical Decision Making  Medically screening exam initiated at 11:25 AM.  Appropriate orders placed.  Patty Salas was informed that the remainder of the evaluation will be completed by another provider, this initial triage assessment does not replace that evaluation, and the importance of remaining in the ED until their evaluation is complete.     Royanne Foots, DO 08/20/23 1126

## 2023-08-20 NOTE — ED Provider Notes (Signed)
  Physical Exam  BP (!) 142/68 (BP Location: Right Arm)   Pulse 69   Temp 99.7 F (37.6 C) (Oral)   Resp 16   Ht 5' 5.5" (1.664 m)   Wt 68 kg   SpO2 99%   BMI 24.58 kg/m   Physical Exam Vitals reviewed.  Constitutional:      Appearance: Normal appearance.  HENT:     Head:     Comments: L periorbital ecchymosis Cardiovascular:     Rate and Rhythm: Normal rate and regular rhythm.     Heart sounds: Normal heart sounds. No murmur heard.    No friction rub. No gallop.  Pulmonary:     Effort: Pulmonary effort is normal. No respiratory distress.     Breath sounds: Normal breath sounds. No wheezing, rhonchi or rales.  Neurological:     Mental Status: She is alert.     Procedures  Procedures  ED Course / MDM   Clinical Course as of 08/20/23 1606  Mon Aug 20, 2023  1545 S Fall x6 days ago, [employer] face forward in [place] parking lot +LOC Here today w/ L periorbital bruising CT imaging negative X-rays pending Can d/c once x-rays read [JR]    Clinical Course User Index [JR] Rolla Flatten, MD   Medical Decision Making Amount and/or Complexity of Data Reviewed Radiology: ordered.  Risk OTC drugs. Prescription drug management.    I assumed care of this patient at 4 PM.  Plan at the time of signout was to follow-up on pending x-ray imaging.  X-rays negative for acute fracture.  Chest x-ray does demonstrate findings that could be reflective of atelectasis versus infiltrate.  Patient not having any respiratory symptoms.  This is likely reflective of atelectasis.  Patient felt to be appropriate for discharge at this time.  Thorough return precautions were discussed with the patient at the time of discharge.  She expressed understanding.  Discharged in stable condition.      Rolla Flatten, MD 08/21/23 0865    Derwood Kaplan, MD 08/23/23 1135

## 2023-08-20 NOTE — ED Notes (Addendum)
Pt. Ambulated to bathroom with cane to bathroom with 1+ assist, pt. tolerated well.

## 2023-08-20 NOTE — ED Triage Notes (Signed)
Patient had mechanical fall outside of a business last Tuesday. Got up with assistance and was not evaluated at that time, has been ambulating at her baseline with a cane, but complains of headache and L sided body pain. Hx migraines and chronic pain.

## 2023-08-21 ENCOUNTER — Other Ambulatory Visit: Payer: Medicare Other

## 2023-08-21 ENCOUNTER — Ambulatory Visit: Payer: Medicare Other | Admitting: Hematology and Oncology

## 2023-08-22 ENCOUNTER — Telehealth: Payer: Self-pay | Admitting: Physical Medicine & Rehabilitation

## 2023-08-22 ENCOUNTER — Other Ambulatory Visit (HOSPITAL_COMMUNITY): Payer: Self-pay

## 2023-08-22 NOTE — Telephone Encounter (Signed)
Patient is asking for Dr Riley Kill to call her. She had a fall and went to ER recently. She wants to let him know everything that is going on.

## 2023-08-22 NOTE — Telephone Encounter (Signed)
Error - duplicate

## 2023-08-22 NOTE — Telephone Encounter (Signed)
I called and spoke to her. Sounds as if she had a concussion. SHe is doing fairly well. We can see her sooner if needed if sx don't improve.

## 2023-08-22 NOTE — Telephone Encounter (Signed)
Patty Salas fell 08/14/2023 at [facility] after passing out. She went to the ER on 08/20/2023. She would like a call back to discuss her images during the ER visit.   Call back [phone].

## 2023-08-24 ENCOUNTER — Ambulatory Visit: Payer: Self-pay | Admitting: Family Medicine

## 2023-08-24 NOTE — Telephone Encounter (Signed)
  Chief Complaint: Recent fall, seen in ED, requesting f/u from ED Pertinent Negatives: Patient denies N/V, denies vision changes,  Disposition: [] ED /[] Urgent Care (no appt availability in office) / [x] Appointment(In office/virtual)/ []  [facility]/ [] Home Care/ [] Refused Recommended Disposition /[] [place] [facility]/ []  Follow-up with PCP Additional Notes: advised to seek ED treatment if change in condition between call and appt on 11/25.   Copied from CRM [id number]. Topic: Clinical - Red Word Triage >> Aug 24, 2023  9:43 AM Dennison Nancy wrote: Red Word that prompted transfer to Nurse Triage: had a bad fall hit head on 08/14/23 in [facility] parking lot went to Emergency Room on 08/20/23 had cane in one hand , try not to hit the other door try not to fall  fell and hit her head patient blackout , still experiencing headache , blur vision ,dizzyness ,tiredness with vomiting  not able to drive at times . Call back # [phone] . Patient call to schedule an emergency followup appointment . Reason for Disposition  MILD weakness (i.e., does not interfere with ability to work, go to school, normal activities)  (Exception: Mild weakness is a chronic symptom.)  Answer Assessment - Initial Assessment Questions 1. MECHANISM: "How did the fall happen?"     Mechanical fall 2. DOMESTIC VIOLENCE AND ELDER ABUSE SCREENING: "Did you fall because someone pushed you or tried to hurt you?" If Yes, ask: "Are you safe now?"     denies 3. ONSET: "When did the fall happen?" (e.g., minutes, hours, or days ago)     11/12 4. LOCATION: "What part of the body hit the ground?" (e.g., back, buttocks, head, hips, knees, hands, head, stomach)     head 5. INJURY: "Did you hurt (injure) yourself when you fell?" If Yes, ask: "What did you injure? Tell me more about this?" (e.g., body area; type of injury; pain severity)"     Yes, sought ED treatment.  9. OTHER SYMPTOMS: "Do you have any other symptoms?" (e.g.,  dizziness, fever, weakness; new onset or worsening).      Dizziness intermittent since fall.  Protocols used: Falls and [facility]

## 2023-08-27 ENCOUNTER — Encounter: Payer: Self-pay | Admitting: Family Medicine

## 2023-08-27 ENCOUNTER — Ambulatory Visit (INDEPENDENT_AMBULATORY_CARE_PROVIDER_SITE_OTHER): Payer: Medicare Other | Admitting: Family Medicine

## 2023-08-27 VITALS — BP 136/79 | HR 102 | Ht 65.5 in | Wt 151.4 lb

## 2023-08-27 DIAGNOSIS — Z79899 Other long term (current) drug therapy: Secondary | ICD-10-CM | POA: Diagnosis not present

## 2023-08-27 DIAGNOSIS — G43519 Persistent migraine aura without cerebral infarction, intractable, without status migrainosus: Secondary | ICD-10-CM

## 2023-08-27 NOTE — Assessment & Plan Note (Addendum)
We went over the patient's medications to make sure they are up-to-date.  Allegra was removed as was Coricidin HBP.  Patient not taking Bernita Raisin or Bennie Pierini currently but we let this on until she is able to speak to her neurologist who prescribed it.  Patient is taking Tropium.  She takes hyoscyamine sparingly for IBS.  Patient states she has a contract with her pain management specialist.  States she will not ask [place] for any pain medications.

## 2023-08-27 NOTE — Progress Notes (Signed)
Established Patient Office Visit  Subjective   Patient ID: Patty Salas, female    DOB: [birth year]  Age: 67 y.o. MRN: [id number]  Chief Complaint  Patient presents with   Fall    HPI Patient here today to discuss the fall she had an [facility] that led to her going to the emergency department, as well as update me on her current medications.  Patient states that she is not taking Coricidin HBP.  She is not taking Allegra.  She has not started taking the [place] or Bernita Raisin that was prescribed to her.  Patient has brought in some of her medications including the hyoscyamine.  States she has not taken this in a while.  Patient also states that when she takes her Phenergan she takes it as smaller dose as possible and does not take it if she is going to drive somewhere.  Patient mentions her pain contract with the pain specialist.  Patient is taking her tropsium.   Patient continues to have a headache from her fall.  Has some bruising that has appeared to improve since her last visit.  Currently no double vision.  Patient mentioned concerns about possible blood clot, dating back to some lab test results in 2022.  She states that after the extensive imaging she had she is not currently worried about a blood clot.  We discussed how her D-dimer was elevated in 2022, but in the absence of other symptoms concerning for a blood clot it is unlikely she has 1 now.  Patient wants to put off getting the flu shot until she recovers from her fall.  Advised patient she can get this done here or at a pharmacy.   The 10-year ASCVD risk score (Arnett DK, et al., 2019) is: 12.8%  Health Maintenance Due  Topic Date Due   Zoster Vaccines- Shingrix (1 of 2) 12/29/1973   MAMMOGRAM  05/20/2009   DTaP/Tdap/Td (2 - Td or Tdap) 10/12/2016   INFLUENZA VACCINE  05/03/2023   COVID-19 Vaccine (5 - 2023-24 season) 06/03/2023      Objective:     BP 136/79   Pulse (!) 102   Ht 5' 5.5" (1.664 m)   Wt 151 lb 6.4  oz (68.7 kg)   SpO2 98%   BMI 24.81 kg/m    Physical Exam Gen: alert, oriented Pulm: no resp distress Msk: walks with walker.   Skin: bruising over left eye.   Psych: Tangential speech.   No results found for any visits on 08/27/23.      Assessment & Plan:   Migraine aura, persistent, intractable Assessment & Plan: Patient is taking her triptan.  Has not taken the [place] or quillipta yet.  Advised her to update her neurologist regarding this at their next visit.  Also likely currently experiencing post concussion headaches after her fall in the [facility] parking lot.   Medication management Assessment & Plan: We went over the patient's medications to make sure they are up-to-date.  Allegra was removed as was Coricidin HBP.  Patient not taking Bernita Raisin or Bennie Pierini currently but we let this on until she is able to speak to her neurologist who prescribed it.  Patient is taking Tropium.  She takes hyoscyamine sparingly for IBS.  Patient states she has a contract with her pain management specialist.  States she will not ask [place] for any pain medications.      Return in about 6 months (around 02/24/2024) for chronic concerns.    Sandre Kitty,  MD

## 2023-08-27 NOTE — Assessment & Plan Note (Addendum)
Patient is taking her triptan.  Has not taken the [place] or quillipta yet.  Advised her to update her neurologist regarding this at their next visit.  Also likely currently experiencing post concussion headaches after her fall in the [facility] parking lot.

## 2023-08-27 NOTE — Patient Instructions (Addendum)
It was nice to see you today,  We addressed the following topics today: - I have taken off the medications you asked me to take off.   - follow up in 6 months or sooner if you need it.   Have a great day,  Frederic Jericho, MD

## 2023-08-28 ENCOUNTER — Telehealth: Payer: Self-pay | Admitting: Neurology

## 2023-08-28 MED ORDER — PROMETHAZINE HCL 25 MG PO TABS: ORAL_TABLET | ORAL | 0 refills | Status: DC

## 2023-08-28 NOTE — Telephone Encounter (Signed)
refilled 

## 2023-08-28 NOTE — Telephone Encounter (Signed)
I cancelled the MRI.

## 2023-08-28 NOTE — Telephone Encounter (Signed)
Pt request refill for promethazine (PHENERGAN) 25 MG tablet send to [facility] #[id number]

## 2023-08-28 NOTE — Telephone Encounter (Signed)
Pt asked that her MRI for 01-14 be cancelled, she is not going.  Pt asked that it be noted that the PA has not been done yet on the [place] or [place], she is asking that be initiated.

## 2023-08-28 NOTE — Telephone Encounter (Signed)
CT head did not show any acute abnormalities.  There is no evidence of stroke or bleeding in the brain.  If she wishes to forego MRI of the brain we do not have to proceed.  We were ordering due to recent strokelike symptoms.  IMPRESSION: 1. No acute intracranial abnormality. 2. No acute facial bone fracture. Small contusion along the left cheek. 3. No acute fracture or traumatic malalignment of the cervical spine. 4. Prior C5-C7 ACDF without evidence of hardware complication.

## 2023-08-28 NOTE — Telephone Encounter (Addendum)
Pt had a fall in [facility] parking lot. Want to make Sarah, NP aware have been to the ER and was discharged to go home. They done CT scan cervical spiine without contrast, CT scan head without contrast, CT Scan maxillofacial without contrast. Would like a call from the nurse to determine if need to cancel MRI brain schedule 10/16/23 and about my medications.

## 2023-08-29 ENCOUNTER — Other Ambulatory Visit (HOSPITAL_COMMUNITY): Payer: Self-pay

## 2023-08-29 ENCOUNTER — Telehealth: Payer: Self-pay

## 2023-08-29 NOTE — Telephone Encounter (Signed)
*  [facility]  Pharmacy Patient Advocate Encounter   Received notification from CoverMyMeds that prior authorization for Qulipta 60MG  tablets  is required/requested.   Insurance verification completed.   The patient is insured through [facility] .   Per test claim: PA required; PA submitted to above mentioned insurance via CoverMyMeds Key/confirmation #/EOC [id number] Status is pending

## 2023-08-29 NOTE — Telephone Encounter (Signed)
Pharmacy Patient Advocate Encounter  Received notification from [facility] that Prior Authorization for Qulipta 60mg  tablets has been APPROVED from 10/02/2022 to 11/27/2023   PA #/Case ID/Reference #:  [id number]

## 2023-09-06 ENCOUNTER — Telehealth: Payer: Self-pay

## 2023-09-06 ENCOUNTER — Other Ambulatory Visit (HOSPITAL_COMMUNITY): Payer: Self-pay

## 2023-09-06 NOTE — Telephone Encounter (Signed)
Pt called stating that she is needing this and the Qulipta sent to the [facility] on [address]. Please advise.

## 2023-09-06 NOTE — Telephone Encounter (Signed)
Pharmacy Patient Advocate Encounter  Received notification from [facility] that Prior Authorization for Ubrelvy 100MG  tablets has been APPROVED from 10/02/2022 to 09/05/2024. Ran test claim, Copay is $195.15. This test claim was processed through [facility]- copay amounts may vary at other pharmacies due to pharmacy/plan contracts, or as the patient moves through the different stages of their insurance plan.   PA #/Case ID/Reference #: PA Case ID #: [id number]   Key: [id number]

## 2023-09-07 ENCOUNTER — Encounter: Payer: Medicare Other | Admitting: Registered Nurse

## 2023-09-10 ENCOUNTER — Ambulatory Visit: Payer: Medicare Other | Admitting: Cardiology

## 2023-09-11 ENCOUNTER — Ambulatory Visit: Payer: Self-pay | Admitting: Licensed Clinical Social Worker

## 2023-09-11 ENCOUNTER — Ambulatory Visit: Payer: Self-pay | Admitting: Family Medicine

## 2023-09-11 NOTE — Patient Instructions (Signed)
Visit Information  Thank you for taking time to visit with me today. Please don't hesitate to contact me if I can be of assistance to you.   Following are the goals we discussed today:   Goals Addressed             This Visit's Progress    Care Coordination Activities       Care Coordination Interventions: Patient stated that she suffers from migraines and is under doctors care and has an appointment with her PCP Dr. Constance Goltz on tomorrow and she will drive herself.  Patient stated that she wanted the SW to proceed to complete the SCAT application The SW went over the SDOH needs and the patient did not have any other needs.  SW will follow up on 10/01/2023 at 10:00 am        Our next appointment is by telephone on 10/01/2023 at 10:00 am  Please call the care guide team at [phone] if you need to cancel or reschedule your appointment.   If you are experiencing a Mental Health or Behavioral Health Crisis or need someone to talk to, please call the Suicide and Crisis Lifeline: 988 go to [facility] Urgent [facility] [address] [place] [phone]) call 911  Patient verbalizes understanding of instructions and care plan provided today and agrees to view in MyChart. Active MyChart status and patient understanding of how to access instructions and care plan via MyChart confirmed with patient.     Jeanie Cooks, PhD [facility], [facility] Social Worker Direct Dial: [phone]  Fax: [phone]

## 2023-09-11 NOTE — Telephone Encounter (Signed)
Copied from CRM [id number]. Topic: Clinical - Red Word Triage >> Sep 11, 2023  9:45 AM Patty Salas wrote: Reason for CRM: Fever 100.8- Migraine, nausea ,vomiting, digestive upset.  Chief Complaint:  Fever.100.8  at 530 am .  Current temp is 100.2 at 830 am Running fever since 09/06/23 Reports Migraine but has questions regarding the medication. She does not feel comfortable taking all of the dosages.  Patient  concerned that she may be on too many medications. Symptoms: Fever Nausea and Vomiting 4-5 times on yesterday. This morning took 1/4 of promethazine.  Disposition: [] ED /[] Urgent Care (no appt availability in office) / [x] Appointment(In office/virtual)/ []  [place] Virtual [facility]/ [] Home Care/ [] Refused Recommended Disposition /[] [place] Mobile Bus/ []  Follow-up with PCP Additional Notes: Patient report severe fall  and Saw Dr. Constance Goltz  on 11/25-11/12/24 fell   Dx Concussion   Renal Carcinoma- See Psychiatrist for Bipolar II and GAD- Appointment today at 11:00 am for 30 minutes. Spoke with [facility] fro virtual appointment for today per patient request.  Clinic Access staff will return call to patient. Rn Agent called patient and advised  Archivist will call after speaking with Dr. Constance Goltz. Patient verbalized understanding. Reason for Disposition  Severe chills (i.e., feeling extremely cold WITH shaking chills)  Answer Assessment - Initial Assessment Questions 1. TEMPERATURE: "What is the most recent temperature?"  "How was it measured?"      100.8 oral 2. ONSET: "When did the fever start?"      09/06/2023 3. CHILLS: "Do you have chills?" If yes: "How bad are they?"  (e.g., none, mild, moderate, severe) Yes, to chills     - SEVERE: feeling extremely cold with shaking chills (general body shaking, rigors; even under a thick blanket)       4. OTHER SYMPTOMS: "Do you have any other symptoms besides the fever?"  (e.g., abdomen pain, cough, diarrhea, earache, headache, sore throat,  urination pain)      Migraine, vomiting - yesterday x 4-5 and Nauseous  Took  1/4 of promethazine 5. CAUSE: If there are no symptoms, ask: "What do you think is causing the fever?"       Did a test for Covid -19 at home  this morning and it was negative. 6. CONTACTS: "Does anyone else in the family have an infection?"      7. TREATMENT: "What have you done so far to treat this fever?" (e.g., medications)     Coricidin  8. IMMUNOCOMPROMISE: "Do you have of the following: diabetes, HIV positive, splenectomy, cancer chemotherapy, chronic steroid treatment, transplant patient, etc."     Renal Carcinoma and one Kidney  -  Protocols used: [facility]

## 2023-09-11 NOTE — Patient Outreach (Signed)
  Care Coordination   Follow Up Visit Note   09/11/2023 Name: Patty Salas MRN: [id number] DOB: [birth year]  Patty Salas is a 67 y.o. year old female who sees Sandre Kitty, MD for primary care. I spoke with  Auburn Bilberry by phone today.  What matters to the patients health and wellness today?  SCAT    Goals Addressed             This Visit's Progress    Care Coordination Activities       Care Coordination Interventions: Patient stated that she suffers from migraines and is under doctors care and has an appointment with her PCP Dr. Constance Goltz on tomorrow and she will drive herself.  Patient stated that she wanted the SW to proceed to complete the SCAT application The SW went over the SDOH needs and the patient did not have any other needs.  SW will follow up on 10/01/2023 at 10:00 am        SDOH assessments and interventions completed:  Yes     Care Coordination Interventions:  Yes, provided  Interventions Today    Flowsheet Row Most Recent Value  General Interventions   General Interventions Discussed/Reviewed General Interventions Reviewed, Community Resources  [SCAT Application]        Follow up plan: Follow up call scheduled for 10/01/2023 at 10:00 am    Encounter Outcome:  Patient Visit Completed   Jeanie Cooks, PhD [facility], [facility] Social Worker Direct Dial: [phone]  Fax: [phone]

## 2023-09-12 ENCOUNTER — Encounter: Payer: Self-pay | Admitting: Nurse Practitioner

## 2023-09-12 ENCOUNTER — Ambulatory Visit: Payer: Medicare Other | Admitting: Family Medicine

## 2023-09-12 ENCOUNTER — Ambulatory Visit: Payer: Self-pay | Admitting: Family Medicine

## 2023-09-12 ENCOUNTER — Encounter: Payer: Medicare Other | Admitting: Registered Nurse

## 2023-09-12 ENCOUNTER — Telehealth: Payer: Medicare Other | Admitting: Physician Assistant

## 2023-09-12 ENCOUNTER — Telehealth: Payer: Self-pay | Admitting: Nurse Practitioner

## 2023-09-12 DIAGNOSIS — G43E09 Chronic migraine with aura, not intractable, without status migrainosus: Secondary | ICD-10-CM | POA: Diagnosis not present

## 2023-09-12 DIAGNOSIS — K580 Irritable bowel syndrome with diarrhea: Secondary | ICD-10-CM

## 2023-09-12 DIAGNOSIS — K589 Irritable bowel syndrome without diarrhea: Secondary | ICD-10-CM

## 2023-09-12 MED ORDER — HYOSCYAMINE SULFATE 0.125 MG PO TABS: 0.1250 mg | ORAL_TABLET | ORAL | 0 refills | Status: DC | PRN

## 2023-09-12 MED ORDER — HYOSCYAMINE SULFATE 0.125 MG PO TBDP
0.1250 mg | ORAL_TABLET | ORAL | 0 refills | Status: DC | PRN
Start: 2023-09-12 — End: 2023-11-22

## 2023-09-12 NOTE — Progress Notes (Signed)
Virtual Visit Consent   Patty Salas, you are scheduled for a virtual visit with a [facility] provider today. Just as with appointments in the office, your consent must be obtained to participate. Your consent will be active for this visit and any virtual visit you may have with one of our providers in the next 365 days. If you have a MyChart account, a copy of this consent can be sent to you electronically.  As this is a virtual visit, video technology does not allow for your provider to perform a traditional examination. This may limit your provider's ability to fully assess your condition. If your provider identifies any concerns that need to be evaluated in person or the need to arrange testing (such as labs, EKG, etc.), we will make arrangements to do so. Although advances in technology are sophisticated, we cannot ensure that it will always work on either your end or our end. If the connection with a video visit is poor, the visit may have to be switched to a telephone visit. With either a video or telephone visit, we are not always able to ensure that we have a secure connection.  By engaging in this virtual visit, you consent to the provision of healthcare and authorize for your insurance to be billed (if applicable) for the services provided during this visit. Depending on your insurance coverage, you may receive a charge related to this service.  I need to obtain your verbal consent now. Are you willing to proceed with your visit today? Patty Salas has provided verbal consent on 09/12/2023 for a virtual visit (video or telephone). [name], [place]  Date: 09/12/2023 11:46 AM  Virtual Visit via Video Note   I, [name], connected with  Patty Salas  ([id number], 25-Feb-1956) on 09/12/23 at 10:15 AM EST by a video-enabled telemedicine application and verified that I am speaking with the correct person using two identifiers.  Location: Patient: Virtual Visit Location  Patient: Home Provider: Virtual Visit Location Provider: Home Office   I discussed the limitations of evaluation and management by telemedicine and the availability of in person appointments. The patient expressed understanding and agreed to proceed.    History of Present Illness: Patty Salas is a 67 y.o. who identifies as a female who was assigned female at birth, and is being seen today for chronic diarrhea with acute exacerbation. Also regarding chronic migraines that have increased in frequency.   Regarding loose stool, patient notes ongoing issue with IBS-D but worsening in the past year. Was initially having bouts of blood in stool so was set up with a new GI provider by her PCP since her last GI provider had retired. Has been unable to get an appointment until next week. Thankfully she notes resolution of the episodes of blood in stool several months ago but still dealing with episodic and significant cramping and loose stools. Notes with increase in symptoms over past few days. Notes temperature max at 100.2. Again denies melena, hematochezia or tenesmus. Notes generalized cramping and nausea. Notes she can only tolerate Promethazine due to her medication allergies. Has script for this which she has had to take in past few days, but does not like to take as it makes her drowsy. As such, she did not feel comfortable trying to drive in to her appointment with her PCP later today. Notes previously on hyoscyamine from cramping but has not had since her last GI provider retired. Denies lightheadedness or dizziness.  Does note some increased stressors.   In regards to her headaches, she is currently followed by her PCP and Neurology. Sustained a fall at a [facility] a month ago and since then headaches have been more frequent. Notes current migraine starting yesterday, mainly left-sided with some facial tingling which always happens for her prior to aura formation. Then notes aura resolves and headache  starts. Took Axert yesterday which helped but did not fully get rid of headache. She did not take an additional dose as directed on her prescription. Patty Salas also not started a lot of medications for these prescribed by her neurologist -- some due to cost and others due to patient comfort level with medicines. Wants to discuss coming off of some medicines.   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   S/P repair of ventral hernia 04/22/2021   PAT (paroxysmal atrial tachycardia) (HCC)    Renal cell cancer (HCC) 11/21/2018   Family history of colon cancer in father 01/07/2018   History of adenomatous polyp of colon 01/07/2018   Internal hemorrhoids 01/07/2018   Irritable bowel syndrome with diarrhea 01/07/2018   Vitamin D deficiency 08/28/2017   Primary hypertension 08/28/2017   Herpes simplex- oral lesions 08/10/2017   Bipolar 1 disorder, mixed, moderate (HCC) 08/10/2017   History of anaphylaxis 08/10/2017   Postmenopausal syndrome 08/10/2017   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   History of kidney cancer 02/22/2016   De Quervain's tenosynovitis, right 04/24/2014   Enlarged lymph node 02/12/2014   BPPV (benign paroxysmal positional vertigo) 12/05/2013   Cervical spondylosis without myelopathy 09/17/2013   Carpal tunnel syndrome 01/01/2013   Wrist tendonitis 01/10/2012   Fibromyalgia/myofascial pain syndrome 12/06/2011   Lumbar spondylosis 12/06/2011   Lumbar degenerative disc disease 12/06/2011   Depression with anxiety 12/06/2011   Migraine aura, persistent, intractable 12/06/2011    Allergies:  Allergies  Allergen Reactions   Aripiprazole Anaphylaxis    Stiffened muscles, extrapyramidal effects all  over body.  Other reaction(s): Other (See Comments) Stiffened muscles Stiffened muscles, extrapyramidal effects all over body.  Stiffened muscles Stiffened muscles, extrapyramidal effects all over body.     Aspirin Hives, Itching and Anaphylaxis   Bromfenac Sodium Anaphylaxis   Chlorpromazine Anaphylaxis    Other reaction(s): Other (See Comments) Stiffened muscles Stiffened muscles    Cymbalta [Duloxetine Hcl] Anaphylaxis    Suicidal if in combination w/ Axert   Divalproex Sodium Anaphylaxis   Duloxetine Anaphylaxis    Suicidal if in combination w/ Axert Suicidal if in combination w/ Axert   Duract [Bromfenac] Anaphylaxis   Flurazepam Anaphylaxis   Hydrochlorothiazide Anaphylaxis    Pt loses facial movement uncontrolled;  Pt loses facial movement uncontrolled;  Pt loses facial movement uncontrolled;    Imitrex [Sumatriptan Succinate] Anaphylaxis   Latex Anaphylaxis   Mellaril Anaphylaxis   Metoclopramide Anaphylaxis and Other (See Comments)    Headaches.  Other reaction(s): Headache, Other (See Comments) headache Headaches.  headache Headaches.   Other reaction(s): headache   Olanzapine Anaphylaxis   Olanzapine Anaphylaxis and Other (See Comments)    Unknown    Other Itching, Other (See Comments), Hives, Nausea And Vomiting, Rash and Anaphylaxis    Dust, mold, feathers, wool, flannel, cologne, chlorox, household cleaning products, scented candles, cinnamon,  ivory soap, paints, sun sentitive, acidic fruits, ( lemons, limes, strawbery, tartrazine yellow#5 and 6 in foods, MSG food additives, sudiium lauryl sulates, hot peppers, spearmint, wintergreen peppermint, mentol, orange, jalapenos. ---headache, breathing, welps, canker sores inside mouth, uticaria Other reaction(s): Other (See Comments) Potassium-containing Compounds-Undiluted through IV. --Pain.  Dust, mold, feathers, wool, flannel, cologne, chlorox, household cleaning products, scented candles, cinnamon, ivory soap,  paints, sun sentitive, acidic fruits, ( lemons, limes, strawbery, tartrazine yellow#5 and 6 in foods, MSG food additives, sudiium lauryl sulates, hot peppers, spearmint, wintergreen peppermint, mentol, orange, jalapenos. ---headache, breathing, welps, canker sores inside mouth, uticaria Hives on back and looked like sun Dust, mold, feathers, wool, flannel, cologne, chlorox, household cleaning products, scented candles, cinnamon, ivory soap, paints, sun sentitive, acidic fruits, ( lemons, limes, strawbery, tartrazine yellow#5 and 6 in foods, MSG food additives, sudiium lauryl sulates, hot peppers, spearmint, wintergreen peppermint, mentol, orange, jalapenos. ---headache, breathing, welps, canker sores inside mouth, uticaria   Penicillins Anaphylaxis   Quetiapine Anaphylaxis    Other reaction(s): Other (See Comments) Bad dreams, too sedating. extreme fatigue and bad dreams extreme fatigue and bad dreams Bad dreams, too sedating.   Statins Hives and Anaphylaxis    Other reaction(s): Other (See Comments) Paranoid affect, muscle spasms.  Paranoid affect, muscle spasms.     Sumatriptan Anaphylaxis and Other (See Comments)   Thioridazine Anaphylaxis   Topamax Anaphylaxis and Other (See Comments)    Confusion, tremor, blurred vision   Topiramate Anaphylaxis    Other reaction(s): Other (See Comments) Confusion, tremor, blurred vision Confusion, tremor, blurred vision    Trifluoperazine Anaphylaxis    Other reaction(s): Other (See Comments) Stiffened muscles Stiffened muscles    Aripiprazole Other (See Comments)    Stiffened muscles   Dalmane [Flurazepam Hcl] Other (See Comments)    Stiffened all muscles   Darifenacin Hydrobromide Er Hives    Hives on back and looked like sunburn   Metoclopramide Hcl Other (See Comments)    headache   Seroquel [Quetiapine Fumerate] Other (See Comments)    extreme fatigue and bad dreams   Stelazine Other (See Comments)    Stiffened muscles   Thorazine  [Chlorpromazine Hcl] Other (See Comments)    Stiffened muscles   Allopurinol Hives and Other (See Comments)   Aspirin Effervescent Other (See Comments)    Mouth ulcers, swallowing problems.  Mouth ulcers, swallowing problems.  Mouth ulcers, swallowing problems.     Barium Sulfate Itching and Other (See Comments)   Benzocaine Other (See Comments)    orthicoat sprays containing menthol or lemon flavor--breaks inside of mouth out, red rash, mouth ulcers.  Other reaction(s): Other (See Comments) orthicoat sprays containing menthol or lemon flavor--breaks inside of mouth out, red rash, mouth ulcers.  orthicoat sprays containing menthol or lemon flavor--breaks inside of mouth out, red rash, mouth ulcers.    Biofreeze [Menthol (Topical Analgesic)] Hives and Itching   Camphor-Menthol     Other reaction(s): Not available   Capsaicin Itching    Redness  Redness Redness    Capsaicin-Menthol Other (See Comments)   Clindamycin Itching and Other (See Comments)   Clindamycin/Lincomycin     Bad reflux and loose stools    Clove Oil Itching and Other (See Comments)   Colchicine Hives and Other (See Comments)   Cycloheximide Itching and Other (See Comments)   Darifenacin Hives   Diatrizoate Itching    welps  Other reaction(s): Other (See Comments) welps  welps  Other reaction(s): Not available   Diflucan [Fluconazole] Hives and  Itching   Erythromycin Other (See Comments)    Other reaction(s): Unknown   Garlic Itching and Other (See Comments)    Mouth swelling   Iohexol      Code: HIVES, Desc: pt broke out in red rash and hives after CT injection on 12/07/09.-pt needs 13 hr prep kit, Onset Date: [id number]  Other reaction(s): Other (See Comments)  Code: HIVES, Desc: pt broke out in red rash and hives after CT injection on 12/07/09.-pt needs 13 hr prep kit, Onset Date: [id number]  Code: HIVES, Desc: pt broke out in red rash and hives after CT injection on 12/07/09.-pt needs 13 hr prep kit, Onset  Date: [id number]   Ivp Dye [Iodinated Contrast Media]    Metronidazole Other (See Comments)   Mirabegron     swelling Other reaction(s): Other (See Comments) swelling swelling   Miralax [Polyethylene Glycol] Nausea And Vomiting   Moxifloxacin Itching    Other reaction(s): Not available   Nsaids     Mouth ulcers, swelling of mouth.    Oxybutynin Diarrhea   Polyethylene Glycol 3350 Itching   Potassium Chloride Itching   Potassium-Containing Compounds     Undiluted through IV. --Pain.    Prednisone Itching and Other (See Comments)    No  Sleep at night.  Other reaction(s): Insomnia, Other (See Comments) No  Sleep at night.  No  Sleep at night.   Other reaction(s): insomnia   Psyllium Other (See Comments)    Mouth ulcers, swallowing problems. Other reaction(s): Other (See Comments) Mouth ulcers, swallowing problems. Mouth ulcers, swallowing problems.    Red Dye #40 (Allura Red)     Other reaction(s): Unknown   Seroquel [Quetiapine Fumarate]     Bad dreams, too sedating.   Simvastatin Other (See Comments)    Paranoid affect, muscle spasms.    Urocit - K [Potassium Citrate]     Breaks mouth out   Avelox [Moxifloxacin Hcl In Nacl] Nausea And Vomiting   Barium-Containing Compounds Rash   Butalbital-Aspirin-Caffeine Itching, Rash and Other (See Comments)    Welps, Welps, Welps,    Diclofenac Rash and Other (See Comments)   Doxycycline Nausea Only and Other (See Comments)    Stomach upset.  Stomach upset.  Stomach upset.    E-Mycin [Erythromycin Base] Nausea Only   Flagyl [Metronidazole Hcl] Nausea And Vomiting   Lamotrigine Rash and Other (See Comments)   Pregabalin     Blurry vision, muscle stiffness    Pregabalin Other (See Comments)    Other reaction(s): Other (See Comments) Blurry vision, muscle stiffness  Blurry vision, muscle stiffness     Risperidone Other (See Comments)    Too sedating   Risperidone Other (See Comments)    Too sedating.  Other  reaction(s): Other (See Comments) Too sedating.  Too sedating Too sedating Too sedating.     Toradol [Ketorolac Tromethamine] Itching and Rash   Medications:  Current Outpatient Medications:    hyoscyamine (LEVSIN) 0.125 MG tablet, Take 1 tablet (0.125 mg total) by mouth every 4 (four) hours as needed., Disp: 30 tablet, Rfl: 0   acetaminophen (TYLENOL) 500 MG tablet, Take 500 mg by mouth every 6 (six) hours as needed for moderate pain., Disp: , Rfl:    almotriptan (AXERT) 12.5 MG tablet, Take 1 tablet (12.5 mg total) by mouth as needed for migraine. may repeat in 2 hours if needed, Disp: 24 tablet, Rfl: 1   Atogepant (QULIPTA) 60 MG TABS, Take 1 tablet (60 mg total) by mouth daily., Disp:  30 tablet, Rfl: 11   bisacodyl (DULCOLAX) 5 MG EC tablet, Take 10 mg by mouth daily as needed for mild constipation. , Disp: , Rfl:    cyclobenzaprine (FLEXERIL) 10 MG tablet, Take 1 tablet (10 mg total) by mouth 3 (three) times daily., Disp: 90 tablet, Rfl: 4   docusate sodium (COLACE) 100 MG capsule, Take 100 mg by mouth daily as needed for mild constipation. , Disp: , Rfl:    EPINEPHrine 0.3 mg/0.3 mL IJ SOAJ injection, Inject 0.3 mg into the muscle as needed for anaphylaxis., Disp: 2 each, Rfl: 2   furosemide (LASIX) 20 MG tablet, Take 1 tablet (20 mg total) by mouth daily as needed for edema. Please keep upcoming Dec appt for further refills. Thank you, Disp: 90 tablet, Rfl: 0   gabapentin (NEURONTIN) 300 MG capsule, Take 2 capsules by mouth at bedtime., Disp: , Rfl:    HYDROcodone-acetaminophen (NORCO/VICODIN) 5-325 MG tablet, Take 1 tablet by mouth every 8 (eight) hours as needed for moderate pain (pain score 4-6)., Disp: 80 tablet, Rfl: 0   Loperamide HCl (IMODIUM A-D PO), , Disp: , Rfl:    LORazepam (ATIVAN) 1 MG tablet, Take 1 tablet by mouth 3 (three) times daily., Disp: , Rfl:    promethazine (PHENERGAN) 25 MG tablet, TAKE 1/2 TABLET BY MOUTH EVERY 6 HOURS AS NEEDED FOR NAUSEA OR VOMITING, Disp:  15 tablet, Rfl: 0   Trospium Chloride 60 MG CP24, Take 1 capsule by mouth every morning. , Disp: , Rfl:    Ubrogepant (UBRELVY) 100 MG TABS, Take 1 tablet (100 mg total) by mouth as needed. Take 1 tablet at onset of headache, may repeat in 2 hours if needed. Max is 200 mg in 24 hours., Disp: 12 tablet, Rfl: 11   Ubrogepant (UBRELVY) 100 MG TABS, Take 1/2 tablet at onset on migraine. May repeat in 2 hours if needed, Disp: 2 tablet, Rfl: 0   VENTOLIN HFA 108 (90 Base) MCG/ACT inhaler, INHALE 2 PUFFS INTO THE LUNGS EVERY 6 HOURS AS NEEDED FOR WHEEZE, Disp: 18 each, Rfl: 1  Observations/Objective: Patient is well-developed, well-nourished in no acute distress.  Resting comfortably  at home.  Head is normocephalic, atraumatic.  No labored breathing.  Speech is clear and coherent with logical content.  Patient is alert and oriented at baseline.   Assessment and Plan: 1. Irritable bowel syndrome with diarrhea - hyoscyamine (LEVSIN) 0.125 MG tablet; Take 1 tablet (0.125 mg total) by mouth every 4 (four) hours as needed.  Dispense: 30 tablet; Refill: 0  2. Chronic migraine with aura without status migrainosus, not intractable  Patient is a complex historian and likely relating to her BPD she is hard to keep focused and on track with the reason for visit, having to be frequently pulled back to matter at hand. She has several ongoing issues that really warrant follow-up with her specialists. In regards to migraines and increased frequency, she is A/o x 3 today with no gross neurological abnormalities and improved for level of migraine yesterday. Encouraged her to take her medicines as directed to abort her current headache fully and to follow-up with Neurologist regarding moving forward with management.  She has some atypical migraine symptoms that are common with her so likely dealing with complex migraines but strict ER precautions were reviewed with patient.   Regarding IBS symptoms the increase  recently could be due to stress or mild viral URI. She is afebrile. No vomiting. No blood in stool at present. Is hydrated.  Will add on Hyoscyamine as she has taken in the past and tolerated well, and no other good options presently (80 listed medication allergies on chart, several anaphylactic). She needs follow-up with new GI provider as scheduled on Wednesday for her chronic symptoms. If anything worsening in the mean time, must seek ER evaluation.    Follow Up Instructions: I discussed the assessment and treatment plan with the patient. The patient was provided an opportunity to ask questions and all were answered. The patient agreed with the plan and demonstrated an understanding of the instructions.  A copy of instructions were sent to the patient via MyChart unless otherwise noted below.   The patient was advised to call back or seek an in-person evaluation if the symptoms worsen or if the condition fails to improve as anticipated.    [name], PA-C

## 2023-09-12 NOTE — Telephone Encounter (Signed)
Chief Complaint: symptoms worsened since yesterday  Symptoms: fever, migraine, diarrhea, body aches, left side of face numb (since yesterday)  Frequency: since 3am  Pertinent Negatives: Patient denies thoughts of self harm or harming others, blood in stool, changes in speech, changes in vision, weakness or numbness to one side of body, chest pain, SOB, dizziness  Disposition: [] ED /[] Urgent Care (no appt availability in office) / [] Appointment(In office/virtual)/ [x]  [facility]/ [] Home Care/ [] Refused Recommended Disposition /[] [facility]/ []  Follow-up with PCP Additional Notes: Patient called in c/o worsening diarrhea, migraine, fever and body aches since 3 am.  Not improved with Imodium x 3 this AM. Pt reports her fever was 100.2 and she had episodes of diarrhea. Pt denies dehydration and states she is able to keep clear liquids down and urinate. Pt is poor historian, unable to answer triage assessment questions. Pt with flight of ideas and disorganized thoughts. Pt mentioned the left side of her face has been numb and stated it started yesterday with her migraine, when asked further questions pt then states she spoke to a nurse a long time ago about it. 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 Pt refuses ED recommendation. Pt denies other stroke symptoms such as changes in vision, speech, balance, weakness or numbness to other parts of body. Multiple attempts made to triage patient and redirect her. Pt states she has an OV with Dr Constance Goltz at 3:50pm but states she "can not get off the toilet" due to the diarrhea. Pt agreeable to virtual visit. Pt denies thoughts to harm herself or others. Pt verbalizes understanding to call 911 for any stroke symptoms, chest pain, SOB or worsening symptoms.  Copied from CRM [id number]. Topic: Clinical - Pink Word Triage >> Sep 12, 2023  8:11 AM Patty Salas wrote: Reason for Triage: Still having Migraines / 100.2 fever at late  night / diarrhea / bad cramps after diarrhea medication Reason for Disposition  [1] Overly worried caller AND [2] third call within 48 hours about the same medical problem  [1] SEVERE diarrhea (e.g., 7 or more times / day more than normal) AND [2] age > 60 years  Answer Assessment - Initial Assessment Questions 1. DIARRHEA SEVERITY: "How bad is the diarrhea?" "How many more stools have you had in the past 24 hours than normal?"    - NO DIARRHEA (SCALE 0)   - MILD (SCALE 1-3): Few loose or mushy BMs; increase of 1-3 stools over normal daily number of stools; mild increase in ostomy output.   -  MODERATE (SCALE 4-7): Increase of 4-6 stools daily over normal; moderate increase in ostomy output.   -  SEVERE (SCALE 8-10; OR "WORST POSSIBLE"): Increase of 7 or more stools daily over normal; moderate increase in ostomy output; incontinence.     8 episodes of brown watery diarrhea since 3 am  2. ONSET: "When did the diarrhea begin?"      "I'm not sure"  3. BM CONSISTENCY: "How loose or watery is the diarrhea?"      Watery  4. VOMITING: "Are you also vomiting?" If Yes, ask: "How many times in the past 24 hours?"      Yes; pt reports yesterday morning  5. ABDOMEN PAIN: "Are you having any abdomen pain?" If Yes, ask: "What does it feel like?" (e.g., crampy, dull, intermittent, constant)      Yes. 6. ABDOMEN PAIN SEVERITY: If present, ask: "How bad is the pain?"  (e.g., Scale  1-10; mild, moderate, or severe)   - MILD (1-3): doesn't interfere with normal activities, abdomen soft and not tender to touch    - MODERATE (4-7): interferes with normal activities or awakens from sleep, abdomen tender to touch    - SEVERE (8-10): excruciating pain, doubled over, unable to do any normal activities       Pt unable to answer. States at some times "it'll bend you over its so bad"  7. ORAL INTAKE: If vomiting, "Have you been able to drink liquids?" "How much liquids have you had in the past 24 hours?"     Pt  states she has been drinking some water and ginger ale  8. HYDRATION: "Any signs of dehydration?" (e.g., dry mouth [not just dry lips], too weak to stand, dizziness, new weight loss) "When did you last urinate?"     Pt states urine is kinda clear.  9. EXPOSURE: "Have you traveled to a foreign country recently?" "Have you been exposed to anyone with diarrhea?" "Could you have eaten any food that was spoiled?"     Denies recent travel; denies known exposure to any infectious diseases  10. ANTIBIOTIC USE: "Are you taking antibiotics now or have you taken antibiotics in the past 2 months?"       Pt states "just what Dr Constance Goltz has had me on"  11. OTHER SYMPTOMS: "Do you have any other symptoms?" (e.g., fever, blood in stool)       Fever 100.2 at 3am.  Answer Assessment - Initial Assessment Questions 1. SITUATION:  Document reason for call.     Patient states she has had a fever 100.2 since 3am, 8 episodes of diarrhea since 3:15 this AM, medication questions (asking about spacing out her meds), left side of face numb. Asked patient when her facial numbness started and she answered yesterday then states she spoke with a nurse a long time ago about it. Pt unable to answer further questions regarding stroke symptoms.  2. BACKGROUND: Document any background information (e.g., prior calls, known psychiatric history)     Patient called triage line and spoke with RN yesterday, spoke with office and had virtual visit and has OV today. Pt states she has Bipolar 2 and PTSD.  3. ASSESSMENT: Document your nursing assessment.     Patient unable to answer triage questions directly, giving answers not fitting with prompt. Asked question of rate her abdominal pain and she spoke about her daughters car accident and then IBS and a fall she had years ago.   4. RESPONSE: Document what your response or recommendation was.     Multiple attempts made to redirect patient. Instructed her to answer assessment questions  based on her current symptoms. Patient unable to follow instructions.  Protocols used: Diarrhea-A-AH, Difficult Call-A-AH

## 2023-09-12 NOTE — Telephone Encounter (Signed)
Requesting hyoscyamine is switched to disintegrating tablets.   Meds ordered this encounter  Medications   hyoscyamine (ANASPAZ) 0.125 MG TBDP disintergrating tablet    Sig: Place 1 tablet (0.125 mg total) under the tongue every 4 (four) hours as needed for up to 10 days.    Dispense:  30 tablet    Refill:  0

## 2023-09-12 NOTE — Patient Instructions (Signed)
A[place]urn Bilberry, thank you for joining [name], PA-C for today's virtual visit.  While this provider is not your primary care provider (PCP), if your PCP is located in our provider database this encounter information will be shared with them immediately following your visit.   A [facility] MyChart account gives you access to today's visit and all your visits, tests, and labs performed at [facility] " click here if you don't have a [facility] MyChart account or go to [email or web address]  Consent: (Patient) Patty Salas provided verbal consent for this virtual visit at the beginning of the encounter.  Current Medications:  Current Outpatient Medications:    acetaminophen (TYLENOL) 500 MG tablet, Take 500 mg by mouth every 6 (six) hours as needed for moderate pain., Disp: , Rfl:    almotriptan (AXERT) 12.5 MG tablet, Take 1 tablet (12.5 mg total) by mouth as needed for migraine. may repeat in 2 hours if needed, Disp: 24 tablet, Rfl: 1   Atogepant (QULIPTA) 60 MG TABS, Take 1 tablet (60 mg total) by mouth daily., Disp: 30 tablet, Rfl: 11   bisacodyl (DULCOLAX) 5 MG EC tablet, Take 10 mg by mouth daily as needed for mild constipation. , Disp: , Rfl:    cyclobenzaprine (FLEXERIL) 10 MG tablet, Take 1 tablet (10 mg total) by mouth 3 (three) times daily., Disp: 90 tablet, Rfl: 4   docusate sodium (COLACE) 100 MG capsule, Take 100 mg by mouth daily as needed for mild constipation. , Disp: , Rfl:    EPINEPHrine 0.3 mg/0.3 mL IJ SOAJ injection, Inject 0.3 mg into the muscle as needed for anaphylaxis., Disp: 2 each, Rfl: 2   furosemide (LASIX) 20 MG tablet, Take 1 tablet (20 mg total) by mouth daily as needed for edema. Please keep upcoming Dec appt for further refills. Thank you, Disp: 90 tablet, Rfl: 0   gabapentin (NEURONTIN) 300 MG capsule, Take 2 capsules by mouth at bedtime., Disp: , Rfl:    HYDROcodone-acetaminophen (NORCO/VICODIN) 5-325 MG tablet, Take 1 tablet by  mouth every 8 (eight) hours as needed for moderate pain (pain score 4-6)., Disp: 80 tablet, Rfl: 0   Loperamide HCl (IMODIUM A-D PO), , Disp: , Rfl:    LORazepam (ATIVAN) 1 MG tablet, Take 1 tablet by mouth 3 (three) times daily., Disp: , Rfl:    promethazine (PHENERGAN) 25 MG tablet, TAKE 1/2 TABLET BY MOUTH EVERY 6 HOURS AS NEEDED FOR NAUSEA OR VOMITING, Disp: 15 tablet, Rfl: 0   Trospium Chloride 60 MG CP24, Take 1 capsule by mouth every morning. , Disp: , Rfl:    Ubrogepant (UBRELVY) 100 MG TABS, Take 1 tablet (100 mg total) by mouth as needed. Take 1 tablet at onset of headache, may repeat in 2 hours if needed. Max is 200 mg in 24 hours., Disp: 12 tablet, Rfl: 11   Ubrogepant (UBRELVY) 100 MG TABS, Take 1/2 tablet at onset on migraine. May repeat in 2 hours if needed, Disp: 2 tablet, Rfl: 0   VENTOLIN HFA 108 (90 Base) MCG/ACT inhaler, INHALE 2 PUFFS INTO THE LUNGS EVERY 6 HOURS AS NEEDED FOR WHEEZE, Disp: 18 each, Rfl: 1   Medications ordered in this encounter:  No orders of the defined types were placed in this encounter.    *If you need refills on other medications prior to your next appointment, please contact your pharmacy*  Follow-Up: Call back or seek an in-person evaluation if the symptoms worsen or if the condition fails to  improve as anticipated.  [facility] [phone]  Other Instructions Contact your neurologist for follow-up and ongoing management.   Please keep GI appointment scheduled for next week.  Please use the Hyoscyamine as directed. Do not take more than prescribed.   If anything worsens with either the stomach symptoms or in relation to your migraines, you need an in-person evaluation ASAP. DO NOT DELAY CARE.   If you have been instructed to have an in-person evaluation today at a local Urgent Care facility, please use the link below. It will take you to a list of all of our available [facility], including address, phone number  and hours of operation. Please do not delay care.  [facility]  If you or a family member do not have a primary care provider, use the link below to schedule a visit and establish care. When you choose a [facility] primary care physician or advanced practice provider, you gain a long-term partner in health. Find a Primary Care Provider  Learn more about [facility]'s in-office and virtual care options: [facility] - Get Care Now

## 2023-09-12 NOTE — Telephone Encounter (Addendum)
Copied from CRM [id number]. Topic: Clinical - Pink Word Triage >> Sep 12, 2023  8:11 AM Deaijah H wrote: Reason for Triage: Still having Migraines / 100.2 fever at late night / diarrhea / bad cramps after diarrhea medication    0820: Called patient to follow-up and triage, no answer. RN left vmail with call back.

## 2023-09-13 ENCOUNTER — Telehealth: Payer: Self-pay

## 2023-09-13 DIAGNOSIS — G894 Chronic pain syndrome: Secondary | ICD-10-CM

## 2023-09-13 DIAGNOSIS — M797 Fibromyalgia: Secondary | ICD-10-CM

## 2023-09-13 MED ORDER — HYDROCODONE-ACETAMINOPHEN 5-325 MG PO TABS: 1.0000 | ORAL_TABLET | Freq: Three times a day (TID) | ORAL | 0 refills | Status: DC | PRN

## 2023-09-13 NOTE — Telephone Encounter (Signed)
PMP was Reviewed.  Hydrocodone e-scribed to pharmacy to accommodate next scheduled appointment.

## 2023-09-13 NOTE — Telephone Encounter (Signed)
[name] Belue next appointment is on 09/28/2023. She has 4.5 Narco Hydrocodone 5-325 mg on hand. Patient had to reschedule  because of IBS, Migraine & fever.   PMP Report:  Filled  Written  ID  Drug  QTY  Days  Prescriber  RX #  Dispenser  Refill  Daily Dose*  Pymt Type  PMP  08/23/2023 08/22/2023 2  Lorazepam 1 Mg Tablet 90.00 30 Ke Red [id number] [facility] (0531) 0/0 3.00 LME Medicare [place] 08/22/2023 08/22/2023 2  Gabapentin 300 Mg Capsule 60.00 30 Ke Red [id number] [facility] (0531) 0/0  Medicare [place] 08/18/2023 08/08/2023 2  Lorazepam 1 Mg Tablet 15.00 5 Ke Red [id number] [facility] (0531) 0/0 3.00 LME Medicare [place] 08/15/2023 08/13/2023 2  Hydrocodone-Acetamin 5-325 Mg 80.00 26 Eu Tho [id number] [facility] (0531) 0/0 15.38 MME Medicare [place]

## 2023-09-14 ENCOUNTER — Other Ambulatory Visit: Payer: Self-pay | Admitting: Nurse Practitioner

## 2023-09-14 ENCOUNTER — Encounter: Payer: Medicare Other | Attending: Physical Medicine & Rehabilitation | Admitting: Registered Nurse

## 2023-09-14 DIAGNOSIS — K589 Irritable bowel syndrome without diarrhea: Secondary | ICD-10-CM

## 2023-09-17 ENCOUNTER — Other Ambulatory Visit: Payer: Self-pay

## 2023-09-17 NOTE — Patient Outreach (Signed)
  Care Management   Visit Note  09/17/2023 Name: Patty Salas MRN: [id number] DOB: 01/12/1956  Subjective: Patty Salas is a 67 y.o. year old female who is a primary care patient of Constance Goltz Mabeline Caras, MD. The Care Management team was consulted for assistance.      Engaged with patient spoke with patient by telephone.   Outreach to the patient to follow up on a compliment received from the patient for Remigio Eisenmenger, [place]. The patient expressed that Albin Felling has "changed her life' and made a "big difference". She said she really did not know how best to explain it but that Albin Felling had left footprints on her heart".  The patient expressed it was hard for her to trust people and open up to them because of the things that have happened in her life but Albin Felling is not pushy and she listens. Albin Felling makes the patient feel at ease and she considers Albin Felling as a part of her "family". The patient wanted the manager to know how "super" Albin Felling is.   Thanks Albin Felling for making an amazing difference in the life of this patient.  Consent to Services:  Courtesy follow up call  Plan:  The patient will continue to have follow up calls as scheduled   Alto Denver RN, MSN, CCM RN Care Manager  [facility]  Ambulatory Care Management  Direct Number: [phone]

## 2023-09-18 ENCOUNTER — Ambulatory Visit: Payer: Self-pay | Admitting: Family Medicine

## 2023-09-18 ENCOUNTER — Telehealth: Payer: Self-pay
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
## 2023-09-18 NOTE — Telephone Encounter (Signed)
Called patient to follow up on a request for authorization on medication, hyoscyamine 0.125mg . Providers an Virtual Urgent Care unable to refill medication because it had recently been refilled.Elva should request additional refills as needed by her primary care provider.  During our conversation, patient complained of leg swelling which "started in my feet and is now going up my legs. Patient did not verbalize any additional symptoms.  Encouraged patient to reach out to her PCP for guidance or for her to go to in-person urgent care or to the hospital if she is extremely concerned.   Susette Racer, RN Virtual Care & Clinical Informatics 09/18/2023 9:18 AM

## 2023-09-24 MED ORDER — PROMETHAZINE HCL 25 MG PO TABS: ORAL_TABLET | ORAL | 0 refills | Status: DC

## 2023-09-24 NOTE — Addendum Note (Signed)
Addended by: Danne Harbor on: 09/24/2023 08:19 AM   Modules accepted: Orders

## 2023-09-24 NOTE — Telephone Encounter (Signed)
refilled 

## 2023-09-24 NOTE — Telephone Encounter (Signed)
Pt requesting refill of promethazine (PHENERGAN) 25 MG tablet Send to  [id number]

## 2023-09-28 ENCOUNTER — Encounter: Payer: Medicare Other | Admitting: Registered Nurse

## 2023-09-28 NOTE — Progress Notes (Deleted)
Subjective:    Patient ID: Patty Salas, female    DOB: 07-09-1956, 67 y.o.   MRN: [id number]  HPI  Pain Inventory Average Pain {NUMBERS; 0-10:5044} Pain Right Now {NUMBERS; 0-10:5044} My pain is {PAIN DESCRIPTION:21022940}  In the last 24 hours, has pain interfered with the following? General activity {NUMBERS; 0-10:5044} Relation with others {NUMBERS; 0-10:5044} Enjoyment of life {NUMBERS; 0-10:5044} What TIME of day is your pain at its worst? {time of day:24191} Sleep (in general) {BHH GOOD/FAIR/POOR:22877}  Pain is worse with: {ACTIVITIES:21022942} Pain improves with: {PAIN IMPROVES MWUX:32440102} Relief from Meds: {NUMBERS; 0-10:5044}  Family History  Problem Relation Age of Onset   Cancer Mother        stomach   Migraines Mother    Arthritis Mother    Emphysema Mother    Heart disease Father    Hypertension Father    Cancer Father        colon   Dementia Father    Hypertension Brother    Migraines Brother    Hypertension Brother    Migraines Brother    Alcohol abuse Brother    Bipolar disorder Brother    Migraines Daughter    Arthritis Maternal Grandmother    Cancer Maternal Grandmother        stomach   Diabetes Maternal Grandmother    Stroke Maternal Grandmother    Cancer Maternal Aunt        lung   Emphysema Maternal Aunt    Cancer Paternal Uncle        colon   Hypertension Maternal Aunt    Heart disease Maternal Aunt    Cancer Paternal Uncle        lung   Social History   Socioeconomic History   Marital status: Married    Spouse name: Not on file   Number of children: 1   Years of education: COLLEGE1   Highest education level: Not on file  Occupational History   Occupation: HOUSEWIFE    Employer: UNEMPLOYED  Tobacco Use   Smoking status: Never   Smokeless tobacco: Never  Vaping Use   Vaping status: Never Used  Substance and Sexual Activity   Alcohol use: No   Drug use: No   Sexual activity: Not on file  Other Topics Concern    Not on file  Social History Narrative   Patient is right handed.   Patient drinks caffeine occasionally.   Social Drivers of Corporate investment banker Strain: Low Risk  (07/31/2023)   Overall Financial Resource Strain (CARDIA)    Difficulty of Paying Living Expenses: Not hard at all  Food Insecurity: No Food Insecurity (08/17/2023)   Hunger Vital Sign    Worried About Running Out of Food in the Last Year: Never true    Ran Out of Food in the Last Year: Never true  Transportation Needs: No Transportation Needs (08/17/2023)   PRAPARE - Administrator, Civil Service (Medical): No    Lack of Transportation (Non-Medical): No  Physical Activity: Inactive (07/31/2023)   Exercise Vital Sign    Days of Exercise per Week: 0 days    Minutes of Exercise per Session: 0 min  Stress: Stress Concern Present (07/31/2023)   [employer] of Occupational Health - Occupational Stress Questionnaire    Feeling of Stress : To some extent  Social Connections: Moderately Integrated (07/31/2023)   Social Connection and Isolation Panel [NHANES]    Frequency of Communication with Friends and Family: More than  three times a week    Frequency of Social Gatherings with Friends and Family: More than three times a week    Attends Religious Services: More than 4 times per year    Active Member of Clubs or Organizations: No    Attends Banker Meetings: Never    Marital Status: Married   Past Surgical History:  Procedure Laterality Date    kidney stones     ABDOMINAL ADHESION SURGERY     ABDOMINAL HYSTERECTOMY  1995   partial- hemmoraged after surgery-stitch came loose   APPENDECTOMY  1993   hemmoraged after surgery- stitch came loose   CERVICAL DISC SURGERY  06/04/2001   with fusion   CHOLECYSTECTOMY  1985   colonoscopy     COLONOSCOPY     CYSTOSCOPY     FOOT SURGERY     lt   INCISIONAL HERNIA REPAIR N/A 04/22/2021   Procedure: LAPAROSCOPIC INCISIONAL HERNIA REPAIR WITH  MESH;  Surgeon: Berna Bue, MD;  Location: [facility];  Service: General;  Laterality: N/A;   jj stent  07/2002   with ureteroscopy, cystoscopy and then removal of stent in office   LAPAROSCOPIC LYSIS OF ADHESIONS N/A 04/22/2021   Procedure: LYSIS OF ADHESIONS;  Surgeon: Berna Bue, MD;  Location: [facility];  Service: General;  Laterality: N/A;   LITHOTRIPSY     LYMPH NODE BIOPSY Right 03/06/2014   Procedure: right groin LYMPH NODE BIOPSY;  Surgeon: Robyne Askew, MD;  Location:  SUR[facility]ERY CENTER;  Service: General;  Laterality: Right;   LYMPHADENECTOMY N/A 12/29/2015   Procedure: RETROPERITONEAL LYMPHADENECTOMY;  Surgeon: Sebastian Ache, MD;  Location: [facility];  Service: Urology;  Laterality: N/A;   NECK SURGERY  2002   ROBOT ASSISTED LAPAROSCOPIC NEPHRECTOMY Left 12/29/2015   Procedure: XI ROBOTIC ASSISTED LEFT LAPAROSCOPIC NEPHRECTOMY;  Surgeon: Sebastian Ache, MD;  Location: [facility];  Service: Urology;  Laterality: Left;   TONSILLECTOMY  1976   Past Surgical History:  Procedure Laterality Date    kidney stones     ABDOMINAL ADHESION SURGERY     ABDOMINAL HYSTERECTOMY  1995   partial- hemmoraged after surgery-stitch came loose   APPENDECTOMY  1993   hemmoraged after surgery- stitch came loose   CERVICAL DISC SURGERY  06/04/2001   with fusion   CHOLECYSTECTOMY  1985   colonoscopy     COLONOSCOPY     CYSTOSCOPY     FOOT SURGERY     lt   INCISIONAL HERNIA REPAIR N/A 04/22/2021   Procedure: LAPAROSCOPIC INCISIONAL HERNIA REPAIR WITH MESH;  Surgeon: Berna Bue, MD;  Location: [facility];  Service: General;  Laterality: N/A;   jj stent  07/2002   with ureteroscopy, cystoscopy and then removal of stent in office   LAPAROSCOPIC LYSIS OF ADHESIONS N/A 04/22/2021   Procedure: LYSIS OF ADHESIONS;  Surgeon: Berna Bue, MD;  Location: [facility];  Service: General;  Laterality: N/A;   LITHOTRIPSY     LYMPH NODE BIOPSY Right 03/06/2014   Procedure: right groin LYMPH NODE  BIOPSY;  Surgeon: Robyne Askew, MD;  Location:  SURGERY CENTER;  Service: General;  Laterality: Right;   LYMPHADENECTOMY N/A 12/29/2015   Procedure: RETROPERITONEAL LYMPHADENECTOMY;  Surgeon: Sebastian Ache, MD;  Location: [facility];  Service: Urology;  Laterality: N/A;   NECK SURGERY  2002   ROBOT ASSISTED LAPAROSCOPIC NEPHRECTOMY Left 12/29/2015   Procedure: XI ROBOTIC ASSISTED LEFT LAPAROSCOPIC NEPHRECTOMY;  Surgeon: Sebastian Ache, MD;  Location: [facility];  Service: Urology;  Laterality: Left;   TONSILLECTOMY  1976   Past Medical History:  Diagnosis Date   Anxiety    Arthritis    Asthma    Bipolar 2 disorder (HCC)    pt stated, "I have Bipolar 2 and it is remission"   Bipolar affective (HCC)    CAD (coronary artery disease), native artery transplanted heart    Nonobstructive by coronary CTA 08/2021 with less than 25% stenosis in the LAD, RCA and left circumflex.   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   Full dentures    Gout    Headache(784.0)    migraines   Herpesviral infection    Hypertension    IBS (irritable bowel syndrome)    with diarrhea   Lymphadenopathy    Myalgia and myositis, unspecified    Nephrolithiasis 02/11/2013   PAT (paroxysmal atrial tachycardia) (HCC)    asymptomatic 10 beat run on event monitor 09/2020   PFO (patent foramen ovale)    Small PFO noted on coronary CTA   Pneumonia    PONV (postoperative nausea and vomiting)    PTSD (post-traumatic stress disorder)    Renal calculi    Restless legs syndrome (RLS)    Thoracic radiculopathy    Vitamin D deficiency    Wears glasses    There were no vitals taken for this visit.  Opioid Risk Score:   Fall Risk Score:  `1  Depression screen PHQ 2/9      08/27/2023    1:54 PM 07/31/2023    2:20 PM 07/18/2023   11:09 AM 06/15/2023   10:38 AM 11/17/2022    1:08 PM 10/18/2022   10:32 AM 08/15/2022    1:04 PM  Depression screen PHQ 2/9  Decreased Interest 0 1 1 0 0 0 1  Down, Depressed, Hopeless 0 3 1 0 0 1 1  PHQ - 2 Score 0 4 2 0 0 1 2  Altered sleeping  0  0     Tired, decreased energy  0  1     Change in appetite  0  0     Feeling bad or failure about yourself  0 0  0     Trouble concentrating  0  0     Moving slowly or fidgety/restless  0  1     Suicidal thoughts  0  0     PHQ-9 Score  4  2     Difficult doing work/chores  Somewhat difficult  Not difficult at all        Review of Systems     Objective:   Physical Exam        Assessment & Plan:

## 2023-10-01 ENCOUNTER — Ambulatory Visit: Payer: Self-pay | Admitting: Licensed Clinical Social Worker

## 2023-10-01 NOTE — Patient Outreach (Signed)
  Care Coordination   Follow Up Visit Note   10/01/2023 Name: IKESHA CAFFEY MRN: [id number] DOB: 09-14-1956  PRESTINA JANN is a 67 y.o. year old female who sees Sandre Kitty, MD for primary care. I spoke with  Auburn Bilberry by phone today.  What matters to the patients health and wellness today?  SCAT application    Goals Addressed             This Visit's Progress    Care Coordination Activities   On track    Care Coordination Interventions: Patient stated that she suffers from migraines and is under doctors care and has an appointment with her PCP Dr. Constance Goltz on tomorrow and she will drive herself.  Patient stated that she wanted the SW to proceed to complete the SCAT application The SW went over the SDOH needs and the patient did not have any other needs.  SW will follow up on 10/01/2023 at 10:00 am        SDOH assessments and interventions completed:  Yes     Care Coordination Interventions:  Yes, provided  Interventions Today    Flowsheet Row Most Recent Value  General Interventions   General Interventions Discussed/Reviewed General Interventions Reviewed, Community Resources  [Resent the SCAT application to PCP and follow up]        Follow up plan: Follow up call scheduled for 10/16/2023 at 11:00 am    Encounter Outcome:  Patient Visit Completed   Jeanie Cooks, PhD [facility], [facility] Social Worker Direct Dial: [phone]  Fax: [phone]

## 2023-10-01 NOTE — Patient Instructions (Signed)
Visit Information  Thank you for taking time to visit with me today. Please don't hesitate to contact me if I can be of assistance to you.   Following are the goals we discussed today:   Goals Addressed             This Visit's Progress    Care Coordination Activities   On track    Care Coordination Interventions: Patient stated that she suffers from migraines and is under doctors care and has an appointment with her PCP Dr. Constance Goltz on tomorrow and she will drive herself.  Patient stated that she wanted the SW to proceed to complete the SCAT application The SW went over the SDOH needs and the patient did not have any other needs.  SW will follow up on 10/01/2023 at 10:00 am        Our next appointment is by telephone on 10/16/2023 at 11:00 am  Please call the care guide team at [phone] if you need to cancel or reschedule your appointment.   If you are experiencing a Mental Health or Behavioral Health Crisis or need someone to talk to, please call the Suicide and Crisis Lifeline: 988 go to [facility] [address], [place] [phone]) call 911  Patient verbalizes understanding of instructions and care plan provided today and agrees to view in MyChart. Active MyChart status and patient understanding of how to access instructions and care plan via MyChart confirmed with patient.     Jeanie Cooks, PhD [facility], [facility] Social Worker Direct Dial: [phone]  Fax: [phone]

## 2023-10-04 ENCOUNTER — Encounter: Payer: Medicare Other | Attending: Physical Medicine & Rehabilitation | Admitting: Registered Nurse

## 2023-10-04 NOTE — Progress Notes (Deleted)
   Acute Office Visit  Subjective:     Patient ID: Patty Salas, female    DOB: [birth year], 68 y.o.   MRN: [id number]  No chief complaint on file.   HPI Patient is in today for pharyngitis symptoms  ROS      Objective:    There were no vitals taken for this visit. {Vitals History (Optional):23777}  Physical Exam  No results found for any visits on 10/05/23.      Assessment & Plan:   There are no diagnoses linked to this encounter.   No follow-ups on file.  Toribio MARLA Slain, MD

## 2023-10-05 ENCOUNTER — Ambulatory Visit: Payer: Medicare Other | Admitting: Family Medicine

## 2023-10-08 ENCOUNTER — Other Ambulatory Visit: Payer: Self-pay

## 2023-10-08 MED ORDER — FUROSEMIDE 20 MG PO TABS: 20.0000 mg | ORAL_TABLET | Freq: Every day | ORAL | 1 refills | Status: DC | PRN

## 2023-10-10 NOTE — Progress Notes (Signed)
 `  Subjective:    Patient ID: Patty Salas, female    DOB: 1956-03-31, 68 y.o.   MRN: [id number]  HPI: Patty Salas is a 68 y.o. female who returns for follow up appointment for chronic pain and medication refill. She states her pain is located in her neck radiating into her bilateral shoulders, upper- lower back pain radiating into her bilateral hips and bilateral lower extremities. She also reports bilateral knee pain and generalized joint pain. She rates  her pain 9. Her current exercise regime is walking and performing chair  stretching exercises.  Patty Salas reports she has a fall at [facility] November and landed on her left side, she can't remember all the details related to the fall. She believes a pedestrian helped her up, she was educated on falls prevention, she verbalizes understanding.   Patty Salas Morphine equivalent is 15.38 MME. She is also prescribed Lorazepam   by Dr. Jess .We have discussed the black box warning of using opioids and benzodiazepines. I highlighted the dangers of using these drugs together and discussed the adverse events including respiratory suppression, overdose, cognitive impairment and importance of compliance with current regimen. We will continue to monitor and adjust as indicated.  she is being closely monitored and under the care of her psychiatrist.   UDS ordered today.    Pain Inventory Average Pain 8 Pain Right Now 9 My pain is sharp, dull, stabbing, tingling, and aching  In the last 24 hours, has pain interfered with the following? General activity 9 Relation with others 10 Enjoyment of life 9 What TIME of day is your pain at its worst? morning , daytime, evening, and night Sleep (in general) Fair  Pain is worse with: walking, bending, sitting, inactivity, standing, and some activites Pain improves with: rest, therapy/exercise, medication, and injections Relief from Meds: 5  Family History  Problem Relation Age of Onset   Cancer Mother         stomach   Migraines Mother    Arthritis Mother    Emphysema Mother    Heart disease Father    Hypertension Father    Cancer Father        colon   Dementia Father    Hypertension Brother    Migraines Brother    Hypertension Brother    Migraines Brother    Alcohol  abuse Brother    Bipolar disorder Brother    Migraines Daughter    Arthritis Maternal Grandmother    Cancer Maternal Grandmother        stomach   Diabetes Maternal Grandmother    Stroke Maternal Grandmother    Cancer Maternal Aunt        lung   Emphysema Maternal Aunt    Cancer Paternal Uncle        colon   Hypertension Maternal Aunt    Heart disease Maternal Aunt    Cancer Paternal Uncle        lung   Social History   Socioeconomic History   Marital status: Married    Spouse name: Not on file   Number of children: 1   Years of education: COLLEGE1   Highest education level: Not on file  Occupational History   Occupation: HOUSEWIFE    Employer: UNEMPLOYED  Tobacco Use   Smoking status: Never   Smokeless tobacco: Never  Vaping Use   Vaping status: Never Used  Substance and Sexual Activity   Alcohol  use: No   Drug use: No  Sexual activity: Not on file  Other Topics Concern   Not on file  Social History Narrative   Patient is right handed.   Patient drinks caffeine occasionally.   Social Drivers of Corporate Investment Banker Strain: Low Risk  (07/31/2023)   Overall Financial Resource Strain (CARDIA)    Difficulty of Paying Living Expenses: Not hard at all  Food Insecurity: No Food Insecurity (08/17/2023)   Hunger Vital Sign    Worried About Running Out of Food in the Last Year: Never true    Ran Out of Food in the Last Year: Never true  Transportation Needs: No Transportation Needs (08/17/2023)   PRAPARE - Administrator, Civil Service (Medical): No    Lack of Transportation (Non-Medical): No  Physical Activity: Inactive (07/31/2023)   Exercise Vital Sign    Days of  Exercise per Week: 0 days    Minutes of Exercise per Session: 0 min  Stress: Stress Concern Present (07/31/2023)   [employer] of Occupational Health - Occupational Stress Questionnaire    Feeling of Stress : To some extent  Social Connections: Moderately Integrated (07/31/2023)   Social Connection and Isolation Panel [NHANES]    Frequency of Communication with Friends and Family: More than three times a week    Frequency of Social Gatherings with Friends and Family: More than three times a week    Attends Religious Services: More than 4 times per year    Active Member of Clubs or Organizations: No    Attends Banker Meetings: Never    Marital Status: Married   Past Surgical History:  Procedure Laterality Date    kidney stones     ABDOMINAL ADHESION SURGERY     ABDOMINAL HYSTERECTOMY  1995   partial- hemmoraged after surgery-stitch came loose   APPENDECTOMY  1993   hemmoraged after surgery- stitch came loose   CERVICAL DISC SURGERY  06/04/2001   with fusion   CHOLECYSTECTOMY  1985   colonoscopy     COLONOSCOPY     CYSTOSCOPY     FOOT SURGERY     lt   INCISIONAL HERNIA REPAIR N/A 04/22/2021   Procedure: LAPAROSCOPIC INCISIONAL HERNIA REPAIR WITH MESH;  Surgeon: Signe Mitzie LABOR, MD;  Location: [facility];  Service: General;  Laterality: N/A;   jj stent  07/2002   with ureteroscopy, cystoscopy and then removal of stent in office   LAPAROSCOPIC LYSIS OF ADHESIONS N/A 04/22/2021   Procedure: LYSIS OF ADHESIONS;  Surgeon: Signe Mitzie LABOR, MD;  Location: [facility];  Service: General;  Laterality: N/A;   LITHOTRIPSY     LYMPH NODE BIOPSY Right 03/06/2014   Procedure: right groin LYMPH NODE BIOPSY;  Surgeon: Deward GORMAN Curvin DOUGLAS, MD;  Location: [facility];  Service: General;  Laterality: Right;   LYMPHADENECTOMY N/A 12/29/2015   Procedure: RETROPERITONEAL LYMPHADENECTOMY;  Surgeon: Ricardo Likens, MD;  Location: [facility];  Service: Urology;  Laterality: N/A;   NECK  SURGERY  2002   ROBOT ASSISTED LAPAROSCOPIC NEPHRECTOMY Left 12/29/2015   Procedure: XI ROBOTIC ASSISTED LEFT LAPAROSCOPIC NEPHRECTOMY;  Surgeon: Ricardo Likens, MD;  Location: [facility];  Service: Urology;  Laterality: Left;   TONSILLECTOMY  1976   Past Surgical History:  Procedure Laterality Date    kidney stones     ABDOMINAL ADHESION SURGERY     ABDOMINAL HYSTERECTOMY  1995   partial- hemmoraged after surgery-stitch came loose   APPENDECTOMY  1993   hemmoraged after surgery- stitch  came loose   CERVICAL DISC SURGERY  06/04/2001   with fusion   CHOLECYSTECTOMY  1985   colonoscopy     COLONOSCOPY     CYSTOSCOPY     FOOT SURGERY     lt   INCISIONAL HERNIA REPAIR N/A 04/22/2021   Procedure: LAPAROSCOPIC INCISIONAL HERNIA REPAIR WITH MESH;  Surgeon: Signe Mitzie LABOR, MD;  Location: [facility];  Service: General;  Laterality: N/A;   jj stent  07/2002   with ureteroscopy, cystoscopy and then removal of stent in office   LAPAROSCOPIC LYSIS OF ADHESIONS N/A 04/22/2021   Procedure: LYSIS OF ADHESIONS;  Surgeon: Signe Mitzie LABOR, MD;  Location: [facility];  Service: General;  Laterality: N/A;   LITHOTRIPSY     LYMPH NODE BIOPSY Right 03/06/2014   Procedure: right groin LYMPH NODE BIOPSY;  Surgeon: Deward GORMAN Curvin DOUGLAS, MD;  Location: [facility];  Service: General;  Laterality: Right;   LYMPHADENECTOMY N/A 12/29/2015   Procedure: RETROPERITONEAL LYMPHADENECTOMY;  Surgeon: Ricardo Likens, MD;  Location: [facility];  Service: Urology;  Laterality: N/A;   NECK SURGERY  2002   ROBOT ASSISTED LAPAROSCOPIC NEPHRECTOMY Left 12/29/2015   Procedure: XI ROBOTIC ASSISTED LEFT LAPAROSCOPIC NEPHRECTOMY;  Surgeon: Ricardo Likens, MD;  Location: [facility];  Service: Urology;  Laterality: Left;   TONSILLECTOMY  1976   Past Medical History:  Diagnosis Date   Anxiety    Arthritis    Asthma    Bipolar 2 disorder (HCC)    pt stated, I have Bipolar 2 and it is remission   Bipolar affective (HCC)    CAD  (coronary artery disease), native artery transplanted heart    Nonobstructive by coronary CTA 08/2021 with less than 25% stenosis in the LAD, RCA and left circumflex.   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   Full dentures    Gout    Headache(784.0)    migraines   Herpesviral infection    Hypertension    IBS (irritable bowel syndrome)    with diarrhea   Lymphadenopathy    Myalgia and myositis, unspecified    Nephrolithiasis 02/11/2013   PAT (paroxysmal atrial tachycardia) (HCC)    asymptomatic 10 beat run on event monitor 09/2020   PFO (patent foramen ovale)    Small PFO noted on coronary CTA   Pneumonia    PONV (postoperative nausea and vomiting)    PTSD (post-traumatic stress disorder)    Renal calculi    Restless legs syndrome (RLS)    Thoracic radiculopathy    Vitamin D  deficiency    Wears glasses    There were no vitals taken for this visit.  Opioid Risk Score:   Fall Risk Score:  `1  Depression screen PHQ 2/9     08/27/2023    1:54 PM 07/31/2023    2:20 PM 07/18/2023   11:09 AM 06/15/2023   10:38 AM 11/17/2022    1:08 PM 10/18/2022   10:32 AM 08/15/2022    1:04 PM  Depression screen PHQ 2/9  Decreased Interest 0 1 1 0 0 0 1  Down, Depressed, Hopeless 0 3 1 0 0 1 1  PHQ - 2 Score 0 4 2 0 0  1 2  Altered sleeping  0  0     Tired, decreased energy  0  1     Change in appetite  0  0     Feeling bad or failure about yourself  0 0  0     Trouble concentrating  0  0     Moving slowly or fidgety/restless  0  1     Suicidal thoughts  0  0     PHQ-9 Score  4  2     Difficult doing work/chores  Somewhat difficult  Not difficult at all        Review of Systems  Musculoskeletal:  Positive for arthralgias, back pain and gait  problem.       PAIN ALL OVER THE BODY  Neurological:  Positive for headaches.  All other systems reviewed and are negative.      Objective:   Physical Exam Vitals and nursing note reviewed.  Constitutional:      Appearance: Normal appearance.  Neck:     Comments: Cervical Paraspinal Tenderness: C-5-C-6 Cardiovascular:     Rate and Rhythm: Regular rhythm. Tachycardia present.     Pulses: Normal pulses.     Heart sounds: Normal heart sounds.  Pulmonary:     Effort: Pulmonary effort is normal.     Breath sounds: Normal breath sounds.  Musculoskeletal:     Cervical back: Normal range of motion and neck supple.     Comments: Normal Muscle Bulk and Muscle Testing Reveals:  Upper Extremities:Full  ROM and Muscle Strength 5/5 Bilateral AC Joint Tenderness  Thoracic and Lumbar Hypersensitivity Bilateral Greater Trochanter Tenderness Lower Extremities: Full ROM and Muscle Strength 5/5 Arises from Table with Walker Narrow Base d Gait     Skin:    General: Skin is warm and dry.  Neurological:     Mental Status: She is alert and oriented to person, place, and time.  Psychiatric:        Mood and Affect: Mood normal.        Behavior: Behavior normal.         Assessment & Plan:  History of fibromyalgia with myofascial pain and multiple trigger points.Continue with Heat and exercise Regime. Continue with current medication regimen with  gabapentin . 10/11/2023 2 Chronic migraine headaches.No complaints today. Continue to Monitor. S/P Botox.on 05/30/2017. 10/11/2023 3. Midline Low Back Pain/ Lumbar Spondylosis/Lumbar degenerative disk disease, L4-5/ Lumbar Radiculitis: Continue with HEP and current medication regimen. Continue Hydrocodone  5/325 mg one tablet every 8 hours as needed #80. SABRAContinue with slow weaning of Hydrocodone .  10/11/2023 4. 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 Urology Following. 10/11/2023. 5. Right CTS: Continue to wear Wrist  stabilizer. 10/11/2023 6. Cervicalgia/ Cervical RadiculitisCervical Spondylosis:  Continue current medication regiment with  Gabapentin . Continue  with HEP and Continue to monitor. 10/11/2023 7. Muscle Spasm: Myofascial Pain: She is svcheduled for Trigger Point Injection with Dr Babs : Continue current medication regimen with Flexeril  as needed. 10/11/2023 8. Bilateral Greater Trochanter Bursitis: No complaints today. Continue to alternate with ice and heat therapy. Continue HEP as Tolerated. Continue to monitor. 10/11/2023 9. Bilateral Knee Pain: S/P Knee Injection by Dr Harden on 02/23/2020. Orthopedics Following. Continue to Monitor. 10/11/2023  10. Left Groin Pain: No complaints today.Patty Salas reports Oncology Following. Continue to Monitor. 10/11/2023.  11. Fall at Home: No Falls this month. Educated on falls prevention, she verbalizes understanding. Continue to Monitor. 10/11/2023  12. Everitt Hahn Tenosynovitis: Ortho  Following. . We will Continue to Monitor. 10/11/2023  13/ S/Fall: Educated on [employer]. She verbalizes understanding.    F/U in 1 month

## 2023-10-11 ENCOUNTER — Encounter: Payer: Medicare Other | Attending: Physical Medicine & Rehabilitation | Admitting: Registered Nurse

## 2023-10-11 ENCOUNTER — Encounter: Payer: Self-pay | Admitting: Registered Nurse

## 2023-10-11 VITALS — BP 128/79 | HR 112 | Ht 65.5 in | Wt 154.2 lb

## 2023-10-11 DIAGNOSIS — M5116 Intervertebral disc disorders with radiculopathy, lumbar region: Secondary | ICD-10-CM | POA: Diagnosis not present

## 2023-10-11 DIAGNOSIS — Z905 Acquired absence of kidney: Secondary | ICD-10-CM | POA: Insufficient documentation

## 2023-10-11 DIAGNOSIS — Z760 Encounter for issue of repeat prescription: Secondary | ICD-10-CM | POA: Insufficient documentation

## 2023-10-11 DIAGNOSIS — M5416 Radiculopathy, lumbar region: Secondary | ICD-10-CM | POA: Diagnosis not present

## 2023-10-11 DIAGNOSIS — M7061 Trochanteric bursitis, right hip: Secondary | ICD-10-CM | POA: Insufficient documentation

## 2023-10-11 DIAGNOSIS — M5412 Radiculopathy, cervical region: Secondary | ICD-10-CM | POA: Diagnosis present

## 2023-10-11 DIAGNOSIS — Z79891 Long term (current) use of opiate analgesic: Secondary | ICD-10-CM | POA: Insufficient documentation

## 2023-10-11 DIAGNOSIS — Z79899 Other long term (current) drug therapy: Secondary | ICD-10-CM | POA: Diagnosis not present

## 2023-10-11 DIAGNOSIS — G8929 Other chronic pain: Secondary | ICD-10-CM | POA: Diagnosis not present

## 2023-10-11 DIAGNOSIS — M25562 Pain in left knee: Secondary | ICD-10-CM | POA: Insufficient documentation

## 2023-10-11 DIAGNOSIS — Z5181 Encounter for therapeutic drug level monitoring: Secondary | ICD-10-CM | POA: Insufficient documentation

## 2023-10-11 DIAGNOSIS — G894 Chronic pain syndrome: Secondary | ICD-10-CM

## 2023-10-11 DIAGNOSIS — M4726 Other spondylosis with radiculopathy, lumbar region: Secondary | ICD-10-CM | POA: Insufficient documentation

## 2023-10-11 DIAGNOSIS — M62838 Other muscle spasm: Secondary | ICD-10-CM | POA: Insufficient documentation

## 2023-10-11 DIAGNOSIS — M47812 Spondylosis without myelopathy or radiculopathy, cervical region: Secondary | ICD-10-CM | POA: Insufficient documentation

## 2023-10-11 DIAGNOSIS — M797 Fibromyalgia: Secondary | ICD-10-CM | POA: Insufficient documentation

## 2023-10-11 DIAGNOSIS — M7062 Trochanteric bursitis, left hip: Secondary | ICD-10-CM | POA: Diagnosis not present

## 2023-10-11 DIAGNOSIS — G5601 Carpal tunnel syndrome, right upper limb: Secondary | ICD-10-CM | POA: Insufficient documentation

## 2023-10-11 DIAGNOSIS — W19XXXD Unspecified fall, subsequent encounter: Secondary | ICD-10-CM | POA: Diagnosis not present

## 2023-10-11 DIAGNOSIS — M546 Pain in thoracic spine: Secondary | ICD-10-CM

## 2023-10-11 DIAGNOSIS — M542 Cervicalgia: Secondary | ICD-10-CM | POA: Diagnosis not present

## 2023-10-11 DIAGNOSIS — M25561 Pain in right knee: Secondary | ICD-10-CM | POA: Diagnosis not present

## 2023-10-11 DIAGNOSIS — M7918 Myalgia, other site: Secondary | ICD-10-CM

## 2023-10-11 MED ORDER — HYDROCODONE-ACETAMINOPHEN 5-325 MG PO TABS: 1.0000 | ORAL_TABLET | Freq: Three times a day (TID) | ORAL | 0 refills | Status: DC | PRN

## 2023-10-13 LAB — TOXASSURE SELECT,+ANTIDEPR,UR

## 2023-10-16 ENCOUNTER — Telehealth: Payer: Self-pay | Admitting: *Deleted

## 2023-10-16 ENCOUNTER — Encounter: Payer: Self-pay | Admitting: Licensed Clinical Social Worker

## 2023-10-16 ENCOUNTER — Other Ambulatory Visit: Payer: Medicare Other

## 2023-10-16 NOTE — Progress Notes (Signed)
 Complex Care Management Care Guide Note  10/16/2023 Name: NICHOL ATOR MRN: [id number] DOB: [birth year]  KAYDENSE RIZO is a 68 y.o. year old female who is a primary care patient of Chandra Toribio POUR, MD and is actively engaged with the care management team.  TIFANNY DOLLENS reached out by phone today to assist with re-scheduling  with the BSW.  Follow up plan:Patient request a return call   [facility] Coordination Care Guide  Direct Dial: [phone]

## 2023-10-17 ENCOUNTER — Ambulatory Visit: Payer: Medicare Other | Admitting: Family

## 2023-10-18 ENCOUNTER — Telehealth: Payer: Self-pay | Admitting: Registered Nurse

## 2023-10-18 NOTE — Telephone Encounter (Signed)
Patient was not sure if she had another prescription at the pharmacy to last her to the next appt. She has a prescription that says DNF until 11/11/2023

## 2023-10-18 NOTE — Telephone Encounter (Signed)
Patient needs for a clinical staff to call her about her Norco Medication.

## 2023-10-18 NOTE — Telephone Encounter (Signed)
Left message to call back  

## 2023-10-23 ENCOUNTER — Ambulatory Visit: Payer: Medicare Other | Admitting: Family Medicine

## 2023-10-23 NOTE — Progress Notes (Deleted)
   Established Patient Office Visit  Subjective   Patient ID: Patty Salas, female    DOB: Apr 04, 1956  Age: 68 y.o. MRN: [id number]  No chief complaint on file.   HPI  Foot and ankle swelling-had echo and 2022 that was normal for her age.  CT coronary artery calcium scoring showed nonobstructive CAD.  Cholesterol-LDL 170s.  Did she ever go to a cardiologist/lipid clinic   The 10-year ASCVD risk score (Arnett DK, et al., 2019) is: 11.4%  Health Maintenance Due  Topic Date Due   Zoster Vaccines- Shingrix (1 of 2) 12/29/1973   MAMMOGRAM  05/20/2009   DTaP/Tdap/Td (2 - Td or Tdap) 10/12/2016   INFLUENZA VACCINE  05/03/2023   COVID-19 Vaccine (5 - 2024-25 season) 06/03/2023      Objective:     There were no vitals taken for this visit. {Vitals History (Optional):23777}  Physical Exam   No results found for any visits on 10/23/23.      Assessment & Plan:   There are no diagnoses linked to this encounter.   No follow-ups on file.    Sandre Kitty, MD

## 2023-10-24 ENCOUNTER — Encounter: Payer: Self-pay | Admitting: Family

## 2023-10-24 ENCOUNTER — Ambulatory Visit (INDEPENDENT_AMBULATORY_CARE_PROVIDER_SITE_OTHER): Payer: Medicare Other | Admitting: Family

## 2023-10-24 DIAGNOSIS — M654 Radial styloid tenosynovitis [de Quervain]: Secondary | ICD-10-CM | POA: Diagnosis not present

## 2023-10-24 DIAGNOSIS — M25532 Pain in left wrist: Secondary | ICD-10-CM | POA: Diagnosis not present

## 2023-10-24 MED ORDER — LIDOCAINE HCL 1 % IJ SOLN
1.0000 mL | INTRAMUSCULAR | Status: AC | PRN
  Administered 2023-10-24: 1 mL

## 2023-10-24 MED ORDER — METHYLPREDNISOLONE ACETATE 40 MG/ML IJ SUSP
40.0000 mg | INTRAMUSCULAR | Status: AC | PRN
  Administered 2023-10-24: 40 mg

## 2023-10-24 NOTE — Progress Notes (Signed)
Office Visit Note   Patient: Patty Salas           Date of Birth: 1956-04-27           MRN: [id number] Visit Date: 10/24/2023              Requested by: Sandre Kitty, MD [address] [place],  [place] [address] PCP: Sandre Kitty, MD  Chief Complaint  Patient presents with   Left Hand - Pain      HPI: Is a 68 year old woman who presents today in follow-up for her left thumb wrist and hand pain.  She has a history of de Quervain's tenosynovitis.  Reports today this has flared up again and she feels she has been getting good relief from Depo-Medrol injections she is requesting repeat injection today.  She is now having some new problems she is concerned about carpal tunnel some wrist pain especially when she does the wrist exercises in the handout provided at last visit.  Complaining of some numbness and weakness in the thumb and forefinger endorses dropping things  She does have a night splint as well as an ulnar gutter splint that she has used for these in the past  Assessment & Plan: Visit Diagnoses: No diagnosis found.  Plan: Depo-Medrol injection patient tolerated well.  Have discussed that we would like her to have some EMGs to evaluate possibility of carpal tunnel syndrome will have her follow-up with Dr. Fara Boros for any issues with the hand or wrist in the future  Follow-Up Instructions: No follow-ups on file.   Left Hand Exam   Tenderness  The patient is experiencing tenderness in the dorsal area and radial area.   Range of Motion  Wrist  Extension:  abnormal   Muscle Strength  Grip:  2/5   Tests  Phalen's sign: positive Tinel's sign (median nerve): positive Finkelstein's test: positive  Other  Erythema: absent Sensation: normal Pulse: present      Patient is alert, oriented, no adenopathy, well-dressed, normal affect, normal respiratory effort.   Imaging: No results found. No images are attached to the encounter.  Labs: Lab Results   Component Value Date   HGBA1C 5.7 (H) 08/28/2017   LABURIC 6.3 03/07/2021     Lab Results  Component Value Date   ALBUMIN 4.4 03/30/2022   ALBUMIN 3.8 06/13/2021   ALBUMIN 4.2 03/07/2021    Lab Results  Component Value Date   MG 1.7 08/10/2023   MG 1.7 04/23/2021   MG 2.1 08/28/2017   Lab Results  Component Value Date   VD25OH 33.38 03/30/2022   VD25OH 19.91 (L) 03/07/2021   VD25OH 22.5 (L) 03/24/2020    No results found for: "PREALBUMIN"    Latest Ref Rng & Units 08/20/2023   11:24 AM 03/30/2022    8:36 AM 06/13/2021    1:48 PM  CBC EXTENDED  WBC 4.0 - 10.5 K/uL 4.8  5.4  6.7   RBC 3.87 - 5.11 MIL/uL 3.98  3.89  3.83   Hemoglobin 12.0 - 15.0 g/dL 40.3  47.4  25.9   HCT 36.0 - 46.0 % 38.3  37.2  37.8   Platelets 150 - 400 K/uL 312  282  295   NEUT# 1.7 - 7.7 K/uL 3.3  3.4    Lymph# 0.7 - 4.0 K/uL 0.9  1.2       There is no height or weight on file to calculate BMI.  Orders:  No orders of the defined  types were placed in this encounter.  No orders of the defined types were placed in this encounter.    Procedures: Hand/UE Inj: L extensor compartment 1 for de Quervain's tenosynovitis on 10/24/2023 3:01 PM Indications: therapeutic and diagnostic Details: 22 G needle Medications: 1 mL lidocaine 1 %; 40 mg methylPREDNISolone acetate 40 MG/ML Outcome: tolerated well, no immediate complications Procedure, treatment alternatives, risks and benefits explained, specific risks discussed. Consent was given by the patient. Immediately prior to procedure a time out was called to verify the correct patient, procedure, equipment, support staff and site/side marked as required. Patient was prepped and draped in the usual sterile fashion.      Clinical Data: No additional findings.  ROS:  All other systems negative, except as noted in the HPI. Review of Systems  Objective: Vital Signs: There were no vitals taken for this visit.  Specialty Comments:  No specialty  comments available.  PMFS History: Patient Active Problem List   Diagnosis Date Noted   Medication management 08/27/2023   Statin myopathy [G72.0, T46.6X5A] 12/24/2022   Encounter for Medicare annual wellness exam 09/06/2021   CAD (coronary artery disease), native artery transplanted heart 08/16/2021   S/P repair of ventral hernia 04/22/2021   PAT (paroxysmal atrial tachycardia) (HCC)    Renal cell cancer (HCC) 11/21/2018   Family history of colon cancer in father 01/07/2018   History of adenomatous polyp of colon 01/07/2018   Internal hemorrhoids 01/07/2018   Irritable bowel syndrome with diarrhea 01/07/2018   Vitamin D deficiency 08/28/2017   Primary hypertension 08/28/2017   Herpes simplex- oral lesions 08/10/2017   Bipolar 1 disorder, mixed, moderate (HCC) 08/10/2017   History of anaphylaxis 08/10/2017   Postmenopausal syndrome 08/10/2017   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   History of kidney cancer 02/22/2016   De Quervain's tenosynovitis, right 04/24/2014   Enlarged lymph node 02/12/2014   BPPV (benign paroxysmal positional vertigo) 12/05/2013   Cervical spondylosis without myelopathy 09/17/2013   Carpal tunnel syndrome 01/01/2013   Wrist tendonitis 01/10/2012   Fibromyalgia/myofascial pain syndrome 12/06/2011   Lumbar spondylosis 12/06/2011   Lumbar degenerative disc disease 12/06/2011   Depression with anxiety 12/06/2011   Migraine aura, persistent, intractable 12/06/2011   Past Medical History:  Diagnosis Date   Anxiety    Arthritis    Asthma    Bipolar 2 disorder (HCC)    pt stated, "I have Bipolar 2 and it is remission"   Bipolar affective (HCC)    CAD (coronary artery disease), native artery transplanted heart    Nonobstructive by coronary CTA 08/2021 with less than 25% stenosis in the LAD, RCA and left circumflex.   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   Full dentures    Gout    Headache(784.0)    migraines   Herpesviral infection    Hypertension    IBS (irritable bowel syndrome)    with diarrhea   Lymphadenopathy    Myalgia and myositis, unspecified    Nephrolithiasis 02/11/2013   PAT (paroxysmal atrial tachycardia) (HCC)    asymptomatic 10 beat run on event monitor 09/2020   PFO (patent foramen ovale)    Small PFO noted  on coronary CTA   Pneumonia    PONV (postoperative nausea and vomiting)    PTSD (post-traumatic stress disorder)    Renal calculi    Restless legs syndrome (RLS)    Thoracic radiculopathy    Vitamin D deficiency    Wears glasses     Family History  Problem Relation Age of Onset   Cancer Mother        stomach   Migraines Mother    Arthritis Mother    Emphysema Mother    Heart disease Father    Hypertension Father    Cancer Father        colon   Dementia Father    Hypertension Brother    Migraines Brother    Hypertension Brother    Migraines Brother    Alcohol abuse Brother    Bipolar disorder Brother    Migraines Daughter    Arthritis Maternal Grandmother    Cancer Maternal Grandmother        stomach   Diabetes Maternal Grandmother    Stroke Maternal Grandmother    Cancer Maternal Aunt        lung   Emphysema Maternal Aunt    Cancer Paternal Uncle        colon   Hypertension Maternal Aunt    Heart disease Maternal Aunt    Cancer Paternal Uncle        lung    Past Surgical History:  Procedure Laterality Date    kidney stones     ABDOMINAL ADHESION SURGERY     ABDOMINAL HYSTERECTOMY  1995   partial- hemmoraged after surgery-stitch came loose   APPENDECTOMY  1993   hemmoraged after surgery- stitch came loose   CERVICAL DISC SURGERY  06/04/2001   with fusion   CHOLECYSTECTOMY  1985   colonoscopy      COLONOSCOPY     CYSTOSCOPY     FOOT SURGERY     lt   INCISIONAL HERNIA REPAIR N/A 04/22/2021   Procedure: LAPAROSCOPIC INCISIONAL HERNIA REPAIR WITH MESH;  Surgeon: Berna Bue, MD;  Location: [facility];  Service: General;  Laterality: N/A;   jj stent  07/2002   with ureteroscopy, cystoscopy and then removal of stent in office   LAPAROSCOPIC LYSIS OF ADHESIONS N/A 04/22/2021   Procedure: LYSIS OF ADHESIONS;  Surgeon: Berna Bue, MD;  Location: [facility];  Service: General;  Laterality: N/A;   LITHOTRIPSY     LYMPH NODE BIOPSY Right 03/06/2014   Procedure: right groin LYMPH NODE BIOPSY;  Surgeon: Robyne Askew, MD;  Location: [facility];  Service: General;  Laterality: Right;   LYMPHADENECTOMY N/A 12/29/2015   Procedure: RETROPERITONEAL LYMPHADENECTOMY;  Surgeon: Sebastian Ache, MD;  Location: [facility];  Service: Urology;  Laterality: N/A;   NECK SURGERY  2002   ROBOT ASSISTED LAPAROSCOPIC NEPHRECTOMY Left 12/29/2015   Procedure: XI ROBOTIC ASSISTED LEFT LAPAROSCOPIC NEPHRECTOMY;  Surgeon: Sebastian Ache, MD;  Location: [facility];  Service: Urology;  Laterality: Left;   TONSILLECTOMY  1976   Social History   Occupational History   Occupation: HOUSEWIFE    Associate Professor: UNEMPLOYED  Tobacco Use   Smoking status: Never   Smokeless tobacco: Never  Vaping Use   Vaping status: Never Used  Substance and Sexual Activity   Alcohol use: No   Drug use: No   Sexual activity: Not on file

## 2023-10-24 NOTE — Patient Instructions (Signed)
Follow up with Dr Fara Boros

## 2023-10-25 NOTE — Progress Notes (Signed)
Complex Care Management Care Guide Note  10/25/2023 Name: KANE HARMISON MRN: [id number] DOB: 08-Feb-1956  EMANUELA TACKMAN is a 68 y.o. year old female who is a primary care patient of Sandre Kitty, MD and is actively engaged with the care management team. I reached out to Auburn Bilberry by phone today to assist with re-scheduling  with the BSW.  Follow up plan: Telephone appointment with complex care management team member scheduled for:  2/12  Gwenevere Ghazi  [facility]  [facility], [facility] Guide  Direct Dial: [phone]  Fax [phone]

## 2023-10-29 ENCOUNTER — Other Ambulatory Visit: Payer: Self-pay | Admitting: Neurology

## 2023-10-29 ENCOUNTER — Telehealth: Payer: Self-pay | Admitting: Neurology

## 2023-10-29 NOTE — Telephone Encounter (Signed)
Noted. I do not think she needs to continue at our neurological office. These prescriptions can be maintained by primary care or pain management. She did not wish to pursue MRI. I will send her a my chart note. Thanks

## 2023-10-29 NOTE — Telephone Encounter (Signed)
Pt asked it be noted for Sarah, NP that she is only taking almotriptan (AXERT) 12.5 MG tablet , right now she does not want to take the [place] or Bernita Raisin, pt welcomes RN to call if there are questions on this request. Pt asking Sarah, NP to take the order for her MRI off of my chart please.

## 2023-10-30 NOTE — Telephone Encounter (Signed)
Noted

## 2023-11-01 ENCOUNTER — Other Ambulatory Visit: Payer: Self-pay | Admitting: Nurse Practitioner

## 2023-11-01 ENCOUNTER — Encounter: Payer: Self-pay | Admitting: Neurology

## 2023-11-01 DIAGNOSIS — K589 Irritable bowel syndrome without diarrhea: Secondary | ICD-10-CM

## 2023-11-05 ENCOUNTER — Telehealth: Payer: Self-pay

## 2023-11-05 ENCOUNTER — Telehealth: Payer: Self-pay | Admitting: *Deleted

## 2023-11-05 DIAGNOSIS — G43519 Persistent migraine aura without cerebral infarction, intractable, without status migrainosus: Secondary | ICD-10-CM

## 2023-11-05 NOTE — Progress Notes (Signed)
Complex Care Management Note Care Guide Note  11/05/2023 Name: Patty Salas MRN: [id number] DOB: 08-24-1956  Patty Salas is a 68 y.o. year old female who is a primary care patient of Constance Goltz Mabeline Caras, MD . The community resource team was consulted for assistance with Transportation Needs   SDOH screenings and interventions completed:  Yes  SDOH Interventions Today    Flowsheet Row Most Recent Value  SDOH Interventions   Transportation Interventions SCAT (Specialized Community Area Transporation)  [Spoke with patient to complete the Part A online [employer] [place] application.  Faxed Part B to patient's PCP Mabeline Caras. Constance Goltz, MD to be completed and faxed to [employer] [phone]. Received email confirming receipt of Part A application from [employer].]        Care guide performed the following interventions: Spoke with Lorretta Harp at [employer] they have not received the Part A or B application for the patient. Spoke with patient to complete the Part A online [employer] [place] application.  Faxed Part B to patient's PCP Mabeline Caras. Constance Goltz, MD to be completed and faxed to [employer] [phone]. Received email confirming receipt of Part A application from [employer].  Follow Up Plan:  Care guide will follow up with patient by phone over the next 7 days.  Encounter Outcome:  Patient Visit Completed  Derrell Milanes [facility]  [facility] Guide Direct Dial: [phone]  Fax: [phone] Website: [email or web address]

## 2023-11-05 NOTE — Progress Notes (Signed)
Placed referral for Community resources Care Guides. Patient aware.  Patty Salas  [facility]  [facility], [facility] Guide  Direct Dial: [phone]  Fax [phone]

## 2023-11-07 ENCOUNTER — Telehealth: Payer: Self-pay

## 2023-11-07 NOTE — Telephone Encounter (Signed)
 Copied from CRM [id number]. Topic: General - Other >> Nov 07, 2023  9:11 AM Carlatta H wrote: Reason for CRM: Can you please email Millie at [email or web address] once [employer] application complete

## 2023-11-07 NOTE — Progress Notes (Signed)
 Complex Care Management Note Care Guide Note  11/07/2023 Name: GERALDYNE BARRACLOUGH MRN: [id number] DOB: 10-15-1955  JOYOUS GLEGHORN is a 68 y.o. year old female who is a primary care patient of Chandra Toribio POUR, MD . The community resource team was consulted for assistance with Transportation Needs   SDOH screenings and interventions completed:  No        Care guide performed the following interventions: Spoke with Carlatta at Dr. Adine office to confirm receipt of SCAT Part B application sent on 11/05/2023. Carlatta confirmed it has been received and I will be emailed once Dr. Chandra has completed the form and emailed to [employer]. I confirmed the fax number to send the completed SCAT application [phone]. Also confirmed my email address reminded that all information is on the fax cover sheet as well.    Follow Up Plan:  Care guide will follow up with patient by phone over the next 7 days.  Encounter Outcome:  Patient Visit Completed  Christyann Manolis [facility]  [facility] Guide Direct Dial: [phone]  Fax: [phone] Website: [email or web address]

## 2023-11-08 NOTE — Telephone Encounter (Signed)
 Form completed and email sent.

## 2023-11-08 NOTE — Telephone Encounter (Signed)
 ok

## 2023-11-12 ENCOUNTER — Telehealth: Payer: Self-pay | Admitting: Physical Medicine and Rehabilitation

## 2023-11-12 NOTE — Telephone Encounter (Signed)
 Patient would like to cancel and reschedule her appointment for 11/13/23.

## 2023-11-13 ENCOUNTER — Telehealth: Payer: Self-pay

## 2023-11-13 ENCOUNTER — Ambulatory Visit: Payer: Medicare Other | Admitting: Neurology

## 2023-11-13 ENCOUNTER — Encounter: Payer: Medicare Other | Admitting: Physical Medicine and Rehabilitation

## 2023-11-13 NOTE — Progress Notes (Signed)
Complex Care Management Note Care Guide Note  11/13/2023 Name: STEPHENIE NAVEJAS MRN: [id number] DOB: 04-09-1956  CHARLETT MERKLE is a 68 y.o. year old female who is a primary care patient of Constance Goltz Mabeline Caras, MD . The community resource team was consulted for assistance with Transportation Needs   SDOH screenings and interventions completed:  No        Care guide performed the following interventions: Spoke with patient to update her on the status of her SCAT application. Received email from [facility] at [employer] confirming receipt of Part B of the application. The application will be reviewed on March 6 and the patient will be contacted.  Patient has been given Jennetta's number [phone] if she has any further questions.   Follow Up Plan:  No further follow up planned at this time. The patient has been provided with needed resources.  Encounter Outcome:  Patient Visit Completed  Melayna Robarts [facility]  [facility] Guide Direct Dial: [phone]  Fax: [phone] Website: [email or web address]

## 2023-11-14 ENCOUNTER — Ambulatory Visit: Payer: Self-pay | Admitting: Licensed Clinical Social Worker

## 2023-11-14 NOTE — Patient Outreach (Signed)
  Care Coordination   Initial Visit Note   11/14/2023 Name: ITZAE MCCURDY MRN: [id number] DOB: May 05, 1956  KHILEY LIESER is a 68 y.o. year old female who sees Sandre Kitty, MD for primary care. I spoke with  Auburn Bilberry by phone today.  What matters to the patients health and wellness today?  SCAT Application    Goals Addressed   None     SDOH assessments and interventions completed:  Yes  SDOH Interventions Today    Flowsheet Row Most Recent Value  SDOH Interventions   Food Insecurity Interventions Intervention Not Indicated  Housing Interventions Intervention Not Indicated  Transportation Interventions SCAT (Specialized Community Area Transporation)  [SCAT application]  Utilities Interventions Intervention Not Indicated        Care Coordination Interventions:  Yes, provided  Interventions Today    Flowsheet Row Most Recent Value  General Interventions   General Interventions Discussed/Reviewed General Interventions Discussed, Community Resources  [SCAT Application Part A will be completed by the SW, PART B has been completed by the PCP]        Follow up plan: Follow up call scheduled for 12/03/2023 at 1:00 pm    Encounter Outcome:  Patient Visit Completed   Jeanie Cooks, PhD [facility], [facility] Social Worker Direct Dial: [phone]  Fax: [phone]

## 2023-11-14 NOTE — Progress Notes (Deleted)
 Cardiology Office Note    Patient Name: Patty Salas Date of Encounter: 11/14/2023  Primary Care Provider:  Sandre Kitty, MD Primary Cardiologist:  None Primary Electrophysiologist: None   Past Medical History    Past Medical History:  Diagnosis Date   Anxiety    Arthritis    Asthma    Bipolar 2 disorder (HCC)    pt stated, "I have Bipolar 2 and it is remission"   Bipolar affective (HCC)    CAD (coronary artery disease), native artery transplanted heart    Nonobstructive by coronary CTA 08/2021 with less than 25% stenosis in the LAD, RCA and left circumflex.   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   Full dentures    Gout    Headache(784.0)    migraines   Herpesviral infection    Hypertension    IBS (irritable bowel syndrome)    with diarrhea   Lymphadenopathy    Myalgia and myositis, unspecified    Nephrolithiasis 02/11/2013   PAT (paroxysmal atrial tachycardia) (HCC)    asymptomatic 10 beat run on event monitor 09/2020   PFO (patent foramen ovale)    Small PFO noted on coronary CTA   Pneumonia    PONV (postoperative nausea and vomiting)    PTSD (post-traumatic stress disorder)    Renal calculi    Restless legs syndrome (RLS)    Thoracic radiculopathy    Vitamin D deficiency    Wears glasses     History of Present Illness  Patty Salas is a 68 y.o. female with a PMH of nonobstructive CAD, HTN, renal cell carcinoma s/p nephrectomy, follicular lymphoma, bipolar 2 disorder, fibromyalgia who presents today for 1 year follow-up.  Patty Salas was seen initially by Dr. Mayford Knife in 09/2020 for complaint of lower EXTR edema and upper abdominal pain.  She denied any chest pain but endorsed occasional dizziness which is related  primarily to vertigo.  She was sinus tachycardic with heart rate in low 100s and wore a 3-day event monitor that showed predominantly sinus rhythm with nonsustained atrial tach up to 10 beats.  She was seen in 05/2021 by DOD for complaint of lower extremity edema.  She had recently completed abdominal surgery for incarcerated hernia and was advised that symptoms were related to previous surgery and not cardiac.  TTE was completed showing EF of 65% with grade 1 DD and patient completed a coronary CTA on 08/16/2021 that   who presents today for an 09/2020 23 that revealed a calcium score of 117 that revealed a calcium score of 117 revealed revealed revealed revealed a calcium score 117 with less than 25% stenosis in proximal LAD and less than 25% stenosis in mid left circumflex and OM1 as well as proximal RCA.  There was a small PFO present and patient is statin intolerant and was referred to Pharm.D. for assistance in lowering LDL cholesterol.   During today's visit the patient reports*** .  Patient denies chest pain, palpitations, dyspnea, PND, orthopnea, nausea, vomiting, dizziness, syncope, edema, weight gain, or early satiety.  ***Notes: -Last ischemic evaluation: -Last echo: -Interim ED visits: Review of Systems  Please see the history of present illness.    All  other systems reviewed and are otherwise negative except as noted above.  Physical Exam    Wt Readings from Last 3 Encounters:  10/11/23 154 lb 3.2 oz (69.9 kg)  08/27/23 151 lb 6.4 oz (68.7 kg)  08/20/23 150 lb (68 kg)   ZO:XWRUE were no vitals filed for this visit.,There is no height or weight on file to calculate BMI. GEN: Well nourished, well developed in no acute distress Neck: No JVD; No carotid bruits Pulmonary: Clear to auscultation without rales, wheezing or rhonchi  Cardiovascular: Normal rate. Regular rhythm. Normal S1. Normal S2.   Murmurs: There is no murmur.  ABDOMEN: Soft, non-tender, non-distended EXTREMITIES:   No edema; No deformity   EKG/LABS/ Recent Cardiac Studies   ECG personally reviewed by me today - ***  Risk Assessment/Calculations:   {Does this patient have ATRIAL FIBRILLATION?[phone]}      Lab Results  Component Value Date   WBC 4.8 08/20/2023   HGB 12.4 08/20/2023   HCT 38.3 08/20/2023   MCV 96.2 08/20/2023   PLT 312 08/20/2023   Lab Results  Component Value Date   CREATININE 0.74 08/20/2023   BUN 9 08/20/2023   NA 139 08/20/2023   K 3.7 08/20/2023   CL 101 08/20/2023   CO2 28 08/20/2023   Lab Results  Component Value Date   CHOL 276 (H) 08/10/2023   HDL 57 08/10/2023   LDLCALC 177 (H) 08/10/2023   TRIG 222 (H) 08/10/2023   CHOLHDL 4.8 (H) 08/10/2023    Lab Results  Component Value Date   HGBA1C 5.7 (H) 08/28/2017   Assessment & Plan    1.  Coronary artery disease:  2.  Primary hypertension  3.  Hyperlipidemia  4.  Sinus tachycardia      Disposition: Follow-up with None or APP in *** months {Are you ordering a CV Procedure (e.g. stress test, cath, DCCV, TEE, etc)?   Press F2        :[id number]}   Signed, Napoleon Form, Leodis Rains, NP 11/14/2023, 10:11 AM [facility]

## 2023-11-14 NOTE — Patient Instructions (Signed)
Visit Information  Thank you for taking time to visit with me today. Please don't hesitate to contact me if I can be of assistance to you.   Following are the goals we discussed today:   Goals Addressed   None     Our next appointment is by telephone on 12/03/2023 at 1:00 pm  Please call the care guide team at [phone] if you need to cancel or reschedule your appointment.   If you are experiencing a Mental Health or Behavioral Health Crisis or need someone to talk to, please call the Suicide and Crisis Lifeline: 988 go to [facility] [address], [place] [phone]) call 911  Patient verbalizes understanding of instructions and care plan provided today and agrees to view in MyChart. Active MyChart status and patient understanding of how to access instructions and care plan via MyChart confirmed with patient.     Jeanie Cooks, PhD [facility], [facility] Social Worker Direct Dial: [phone]  Fax: [phone]

## 2023-11-15 ENCOUNTER — Telehealth: Payer: Self-pay | Admitting: *Deleted

## 2023-11-15 ENCOUNTER — Ambulatory Visit: Payer: Medicare Other | Admitting: Nurse Practitioner

## 2023-11-15 ENCOUNTER — Other Ambulatory Visit: Payer: Self-pay | Admitting: Nurse Practitioner

## 2023-11-15 DIAGNOSIS — K589 Irritable bowel syndrome without diarrhea: Secondary | ICD-10-CM

## 2023-11-15 DIAGNOSIS — Z79899 Other long term (current) drug therapy: Secondary | ICD-10-CM

## 2023-11-15 DIAGNOSIS — I10 Essential (primary) hypertension: Secondary | ICD-10-CM

## 2023-11-15 DIAGNOSIS — I25750 Atherosclerosis of native coronary artery of transplanted heart with unstable angina: Secondary | ICD-10-CM

## 2023-11-15 DIAGNOSIS — F3162 Bipolar disorder, current episode mixed, moderate: Secondary | ICD-10-CM

## 2023-11-15 NOTE — Progress Notes (Signed)
Complex Care Management Care Guide Note  11/15/2023 Name: Patty Salas MRN: [id number] DOB: Feb 24, 1956  Patty Salas is a 68 y.o. year old female who is a primary care patient of Sandre Kitty, MD and is actively engaged with the care management team. I reached out to Auburn Bilberry by phone today to assist with scheduling  with the RN Case Manager.  Follow up plan: Telephone appointment with complex care management team member scheduled for:  2/21  Gwenevere Ghazi  [facility]  Value-Based Care Institute, [facility] Guide  Direct Dial: [phone]  Fax [phone]

## 2023-11-16 ENCOUNTER — Other Ambulatory Visit: Payer: Self-pay | Admitting: Nurse Practitioner

## 2023-11-16 DIAGNOSIS — K589 Irritable bowel syndrome without diarrhea: Secondary | ICD-10-CM

## 2023-11-19 ENCOUNTER — Ambulatory Visit: Payer: Self-pay | Admitting: Family Medicine

## 2023-11-19 NOTE — Telephone Encounter (Signed)
 Copied from CRM [id number]. Topic: Appointments - Appointment Scheduling >> Nov 19, 2023  3:15 PM [name] wrote: Patient/patient representative is calling to schedule an appointment. Refer to attachments for appointment information. Chills, cough, sneezing, pink discharge when blowing her nose. Patient has been experiencing chills. Patient is experiencing migraines. Patient is prone to Bronchitis and Pneumonia. Patient ears are stopped up. Patty Salas stated someone would call her back regarding scheduling her an appt. Vision has been blurry. Patient is experiencing Vertigo. Patient is also experiencing cramps. Body is aching all over. Migraine is about a 7.    Chief Complaint: Chills, cough Symptoms: clear nasal drainage, cough, chills, headache, diarrhea (hx diverticulitis), congestion Frequency: Ongoing since Friday Pertinent Negatives: Patient denies SOB at this time Disposition: [] ED /[] Urgent Care (no appt availability in office) / [x] Appointment(In office/virtual)/ []  [facility]/ [] Home Care/ [] Refused Recommended Disposition /[] [facility]/ []  Follow-up with PCP  Additional Notes: Patient stated that she is experiencing cough, chills, congestion, sinus pain since Friday. Patient has been taking Robitussin and Coricidin but symptoms haven't resolved. She is also questioning if she can get a referral to neurology due to hx of migraines. Patient doesn't feel like she is able to drive to the office herself and she does not have a ride. Virtual appt scheduled for tomorrow morning.   Reason for Disposition  SEVERE coughing spells (e.g., whooping sound after coughing, vomiting after coughing)  Answer Assessment - Initial Assessment Questions 1. ONSET: "When did the cough begin?"      On Friday night  2. SEVERITY: "How bad is the cough today?"      Bad and sometimes causes vomiting  3. SPUTUM: "Describe the color of your sputum" (none, dry cough; clear, white, yellow,  green)     N/A  4. HEMOPTYSIS: "Are you coughing up any blood?" If so ask: "How much?" (flecks, streaks, tablespoons, etc.)     N/A  5. DIFFICULTY BREATHING: "Are you having difficulty breathing?" If Yes, ask: "How bad is it?" (e.g., mild, moderate, severe)    - MILD: No SOB at rest, mild SOB with walking, speaks normally in sentences, can lie down, no retractions, pulse < 100.    - MODERATE: SOB at rest, SOB with minimal exertion and prefers to sit, cannot lie down flat, speaks in phrases, mild retractions, audible wheezing, pulse 100-120.    - SEVERE: Very SOB at rest, speaks in single words, struggling to breathe, sitting hunched forward, retractions, pulse > 120      Little bit of SOB, both at rest and with activity. Inhaler helps. Patient denies having trouble breathing right now  6. FEVER: "Do you have a fever?" If Yes, ask: "What is your temperature, how was it measured, and when did it start?"     Patient has been having chills and using 4 blankets to stay warm  7. LUNG HISTORY: "Do you have any history of lung disease?"  (e.g., pulmonary embolus, asthma, emphysema)     Asthma  8. OTHER SYMPTOMS: "Do you have any other symptoms?" (e.g., runny nose, wheezing, chest pain) clear nasal drainage, cough, chills, headache, diarrhea (hx diverticulitis), congestion, Sinus pain in head (7/10 pain level), ears feel stopped up  Protocols used: Cough - Acute Non-Productive-A-AH

## 2023-11-20 ENCOUNTER — Encounter: Payer: Self-pay | Admitting: Family Medicine

## 2023-11-20 ENCOUNTER — Telehealth (INDEPENDENT_AMBULATORY_CARE_PROVIDER_SITE_OTHER): Payer: Medicare Other | Admitting: Family Medicine

## 2023-11-20 VITALS — Ht 65.5 in | Wt 152.3 lb

## 2023-11-20 DIAGNOSIS — J988 Other specified respiratory disorders: Secondary | ICD-10-CM | POA: Insufficient documentation

## 2023-11-20 DIAGNOSIS — G43519 Persistent migraine aura without cerebral infarction, intractable, without status migrainosus: Secondary | ICD-10-CM

## 2023-11-20 MED ORDER — LEVOFLOXACIN 500 MG PO TABS: 500.0000 mg | ORAL_TABLET | Freq: Every day | ORAL | 0 refills | Status: AC

## 2023-11-20 NOTE — Progress Notes (Signed)
 Acute Office Visit  Subjective:     Patient ID: SAARA KIJOWSKI, female    DOB: [birth year], 68 y.o.   MRN: [id number]  Chief Complaint  Patient presents with   Cough   Dizziness   Patient location: Home Provider location: [facility] primary care Duration: 30 minutes Method of visit: Video was attempted twice but unsuccessful.  Switched to audio visit.  HPI  Patient is complaining today of fever, sore throat cough, nasal congestion.  Patient states that symptoms started Saturday evening.  She does not go out often and has no known sick contacts.  States her most recent temperature was 100.8.  Also complaining of bodyaches, shortness of breath.  She is currently quarantining herself from her husband to avoid getting him sick.  Patient also using her Ventolin inhaler.  She is taking Coricidin HBP and Robitussin.  Patient also wished to discuss other issues including: - She has weaned off of her fentanyl patch and is currently weaning off of Norco.  With the help of [name]. - She is currently having a migraine and vertigo. - Patient states she was recently dismissed from her neurologist practice due to noncompliance.  Patient states this was due to not wanting to take all 3 of the prescribed migraine medications but she states if she took all 3 together she would not be able to function due to side effects.  She would like a referral to [facility] health neurology at [facility].  She states she called them and asked if they were taking new patients and was told they are.   ROS      Objective:    Ht 5' 5.5" (1.664 m)   Wt 152 lb 4.8 oz (69.1 kg)   BMI 24.96 kg/m    Physical Exam General: Alert, oriented.  Exam limited by audio only format Pulmonary: No significant respiratory distress noted.  Able to speak in full sentences.  No results found for any visits on 11/20/23.      Assessment & Plan:   Respiratory infection Assessment & Plan: Patient has transportation issues due  to her chronic illness and the side effect of medications required to treat these issues.  For this reason she is unable to come to appointments in person very often.  We attempted to do a video visit but was unsuccessful and had to proceed with audio only.  Patient complains of symptoms concerning for respiratory infection.  She was wanting to know if we could prescribe her levofloxacin which she has taken in the past for respiratory illness.  Given that it is difficult for her to be seen in person due to transportation issues,  her own comorbidities in addition to those of her husband, as well as her report of fever and shortness of breath I will prescribe levofloxacin empirically for potential bacterial pneumonia.  This is one of the few medications she has tolerated well in the past.  I advised patient that if she picks up her antibiotic today she should also see if she can get an over-the-counter flu test because her symptoms are more consistent with flu.  I did not prescribe Tamiflu empirically because she has no known contact with someone recently infected with flu and has limited contact with anyone other than her husband as well as a significant allergy history with 80 documented allergies in her chart, many of them being "anaphylaxis".  So it was best to stick to medications I know she has tolerated in the past  if the diagnosis was uncertain..   Migraine aura, persistent, intractable Assessment & Plan: It appears the patient was recently dismissed from her current neurology practice.  They were not doing further workup at the moment and her medications were not being adjusted.  It was recommended her PCP or other provider continue refills of her current medications for migraine.  The note from her provider says they can help with referral to a headache specialist in the future if the patient would like.  Patient states she would like a referral to the [facility] health neurology at [facility].  We will send  that in to the practice she requested.  Of note, the patient previous neurologist at [facility] recommended referral to headache specialist at [facility] a few months ago.  Patient declined at that time.  If needed in the future we could consider a referral to [facility] headache specialist if she is unable to see the neurologist at [facility].  Orders: -     Ambulatory referral to Neurology  Other orders -     levoFLOXacin; Take 1 tablet (500 mg total) by mouth daily for 10 days.  Dispense: 10 tablet; Refill: 0     No follow-ups on file.  Sandre Kitty, MD

## 2023-11-20 NOTE — Assessment & Plan Note (Addendum)
 It appears the patient was recently dismissed from her current neurology practice.  They were not doing further workup at the moment and her medications were not being adjusted.  It was recommended her PCP or other provider continue refills of her current medications for migraine.  The note from her provider says they can help with referral to a headache specialist in the future if the patient would like.  Patient states she would like a referral to the [facility] health neurology at [facility].  We will send that in to the practice she requested.  Of note, the patient previous neurologist at [facility] recommended referral to headache specialist at [facility] a few months ago.  Patient declined at that time.  If needed in the future we could consider a referral to [facility] headache specialist if she is unable to see the neurologist at [facility].

## 2023-11-20 NOTE — Assessment & Plan Note (Addendum)
 Patient has transportation issues due to her chronic illness and the side effect of medications required to treat these issues.  For this reason she is unable to come to appointments in person very often.  We attempted to do a video visit but was unsuccessful and had to proceed with audio only.  Patient complains of symptoms concerning for respiratory infection.  She was wanting to know if we could prescribe her levofloxacin which she has taken in the past for respiratory illness.  Given that it is difficult for her to be seen in person due to transportation issues,  her own comorbidities in addition to those of her husband, as well as her report of fever and shortness of breath I will prescribe levofloxacin empirically for potential bacterial pneumonia.  This is one of the few medications she has tolerated well in the past.  I advised patient that if she picks up her antibiotic today she should also see if she can get an over-the-counter flu test because her symptoms are more consistent with flu.  I did not prescribe Tamiflu empirically because she has no known contact with someone recently infected with flu and has limited contact with anyone other than her husband as well as a significant allergy history with 80 documented allergies in her chart, many of them being "anaphylaxis".  So it was best to stick to medications I know she has tolerated in the past if the diagnosis was uncertain.Marland Kitchen

## 2023-11-21 ENCOUNTER — Telehealth: Payer: Self-pay

## 2023-11-21 NOTE — Telephone Encounter (Signed)
 Copied from CRM [id number]. Topic: Compliment - Provider (non-sensitive) >> Nov 20, 2023  5:09 PM Kristie Cowman wrote: Date of encounter: 11/20/2023 Details of compliment: The patient called in and wanted to let Dr.Olson know how grateful she is for him being her provider and appreciates everything he has done for her regarding her treatment. Who would the patient like to thank/see rewarded? Dr. Constance Goltz On a scale of 1-10, how was your experience? 10  Route to [employer], [employer] party.

## 2023-11-21 NOTE — Telephone Encounter (Signed)
 Copied from CRM [id number]. Topic: General - Other >> Nov 20, 2023  5:16 PM Kristie Cowman wrote: The patient called in and wanted to let Dr. Constance Goltz know that she was able to fill the antibiotic prescription that he gave her today (levofloxacin).  She also stated that at this time she does not feel like she needs a flu or COVID test, but will call back if something changes.

## 2023-11-22 ENCOUNTER — Ambulatory Visit: Payer: Self-pay | Admitting: Family Medicine

## 2023-11-22 ENCOUNTER — Other Ambulatory Visit: Payer: Self-pay | Admitting: Family Medicine

## 2023-11-22 DIAGNOSIS — K589 Irritable bowel syndrome without diarrhea: Secondary | ICD-10-CM

## 2023-11-22 MED ORDER — HYOSCYAMINE SULFATE 0.125 MG PO TBDP: 0.1250 mg | ORAL_TABLET | ORAL | 0 refills | Status: DC | PRN

## 2023-11-22 NOTE — Telephone Encounter (Signed)
 Sent in to [facility] on [address].

## 2023-11-22 NOTE — Telephone Encounter (Signed)
 Chief Complaint: Stomach pain Symptoms: Pain, cramping Pertinent Negatives: Patient denies radiation Disposition: [] ED /[] Urgent Care (no appt availability in office) / [] Appointment(In office/virtual)/ []  [place] [facility]/ [] Home Care/ [] Refused Recommended Disposition /[] [place] [facility]/ [x]  Follow-up with PCP Additional Notes: Pt states she is having stomach pain and needs a refill of the medication, hyoscyamine 0.125 mg sublingual q4 hr prn for up to 10 days. Pt states she has IBS. Pt discusses the difficult time she is having with her husband being in the hospital. Pt abruptly has to get off phone as she received a call from another [facility] office. Unable to give care advice due to pt having to get off phone.   Copied from CRM [id number]. Topic: Clinical - Red Word Triage >> Nov 22, 2023 11:08 AM Eunice Blase wrote: Red Word that prompted transfer to Nurse Triage: Pt called in pain with stomach pain hyoscyamine (ANASPAZ) 0.125 MG TBDP disintergrating tablet. Reason for Disposition  [1] MILD-MODERATE pain AND [2] comes and goes (cramps)  Answer Assessment - Initial Assessment Questions 1. LOCATION: "Where does it hurt?"      Stomach 2. RADIATION: "Does the pain shoot anywhere else?" (e.g., chest, back)     Denies 3. ONSET: "When did the pain begin?" (e.g., minutes, hours or days ago)      Discussion of her husband in hospital and she has to make a difficult decision for him 4. SEVERITY: "How bad is the pain?"  (e.g., Scale 1-10; mild, moderate, or severe)    - MILD (1-3): Doesn't interfere with normal activities, abdomen soft and not tender to touch.     - MODERATE (4-7): Interferes with normal activities or awakens from sleep, abdomen tender to touch.     - SEVERE (8-10): Excruciating pain, doubled over, unable to do any normal activities.       7  Protocols used: Abdominal Pain - Female-A-AH

## 2023-11-23 ENCOUNTER — Encounter: Payer: Self-pay | Admitting: Neurology

## 2023-11-23 ENCOUNTER — Ambulatory Visit: Payer: Self-pay

## 2023-11-24 NOTE — Patient Instructions (Signed)
 Visit Information  Thank you for taking time to visit with me today. Please don't hesitate to contact me if I can be of assistance to you.   Following are the goals we discussed today:   Goals Addressed             This Visit's Progress    Patient Stated-My concerns are IBS and Migraines       Care Coordination Interventions: Evaluation of current treatment plan related to patient's adherence to plan as established by provider Provided education to patient and/or caregiver about advanced directives Reviewed medications with patient and discussed impotance Active listening / Reflection utilized  Emotional Support Provided Problem Solving /Task Center strategies reviewed cook homemade meals using fresh ingredients when you can. keep a diary of what you eat and any symptoms you get  avoid things that trigger your IBS. try to find ways to relax. get exercise. try probiotics  to see if they help.  Migraines Take migraine medication as directed  Avoid stress anxiety Dehydration  caffeine Certain scents          Our next appointment is by telephone on 12/06/23 at 11 am  Please call the care guide team at [phone] if you need to cancel or reschedule your appointment.   If you are experiencing a Mental Health or Behavioral Health Crisis or need someone to talk to, please call 1-800-273-TALK (toll free, 24 hour hotline)  Patient verbalizes understanding of instructions and care plan provided today and agrees to view in MyChart. Active MyChart status and patient understanding of how to access instructions and care plan via MyChart confirmed with patient.     Juanell Fairly RN, BSN, [facility]  [facility], [facility]  Care Coordinator Phone: [phone]

## 2023-11-24 NOTE — Patient Outreach (Signed)
 Care Coordination   Initial Visit Note   11/24/2023 Name: LYNNELLE MESMER MRN: [id number] DOB: 1956/07/17  MELANEY TELLEFSEN is a 68 y.o. year old female who sees Sandre Kitty, MD for primary care. I spoke with  Auburn Bilberry by phone today.  What matters to the patients health and wellness today?  I recently spoke with Ms. Leonie Man, who expressed her physical and mental discomfort. She reported a fever of 101.21F. I advised her to take Tylenol, but she said she takes medication with Tylenol in it and does not want to take too much. Her blood pressure readings were 138/80, 126/80, and 128/73, with a pulse of 112. Ms. [name] mentioned utilizing music and imagery for relaxation.  She is experiencing mild diarrhea and is using Imodium. Ms. [name] is also attempting to taper off Ativan and Norco. She noted that her husband's hospitalization has significantly impacted her emotional well-being.  Her primary concern is her irritable bowel syndrome. I informed her about her prescription for hyoscyamine at the pharmacy, which she has not yet picked up due to her fever. I advised her to collect the prescription once her condition improves.  Ms. [name] also shared concerns about her migraines, which she feels may mimic a stroke, particularly affecting the left side of her body. She suspects she may have Bell's palsy. I recommended that she consult her physician about these symptoms.  At the end of our conversation, Ms. [name] thanked me, and I assured her of a follow-up in a couple of weeks. I emphasized the importance of contacting emergency services if she experiences severe problems and encouraged her to contact her physician if her fever persists.    Goals Addressed             This Visit's Progress    Patient Stated-My concerns are IBS and Migraines       Care Coordination Interventions: Evaluation of current treatment plan related to patient's adherence to plan as established by provider Provided  education to patient and/or caregiver about advanced directives Reviewed medications with patient and discussed impotance Active listening / Reflection utilized  Emotional Support Provided Problem Solving /Task Center strategies reviewed cook homemade meals using fresh ingredients when you can. keep a diary of what you eat and any symptoms you get  avoid things that trigger your IBS. try to find ways to relax. get exercise. try probiotics  to see if they help.  Migraines Take migraine medication as directed  Avoid stress anxiety Dehydration  caffeine Certain scents          SDOH assessments and interventions completed:  No     Care Coordination Interventions:  Yes, provided   Interventions Today    Flowsheet Row Most Recent Value  Chronic Disease   Chronic disease during today's visit Other  [Migraines IBS]  General Interventions   General Interventions Discussed/Reviewed General Interventions Discussed, General Interventions Reviewed  Education Interventions   Education Provided Provided Education  Provided Verbal Education On Nutrition, Medication  Nutrition Interventions   Nutrition Discussed/Reviewed Fluid intake, Nutrition Discussed  Pharmacy Interventions   Pharmacy Dicussed/Reviewed Pharmacy Topics Discussed, Medications and their functions  Safety Interventions   Safety Discussed/Reviewed Safety Discussed        Follow up plan: Follow up call scheduled for 12/06/23  11 am    Encounter Outcome:  Patient Visit Completed   Juanell Fairly RN, BSN, [facility]  [facility], [facility]  Care Coordinator Phone: [phone]

## 2023-11-25 ENCOUNTER — Other Ambulatory Visit: Payer: Self-pay | Admitting: Family Medicine

## 2023-11-25 DIAGNOSIS — K589 Irritable bowel syndrome without diarrhea: Secondary | ICD-10-CM

## 2023-11-26 ENCOUNTER — Other Ambulatory Visit: Payer: Self-pay | Admitting: Neurology

## 2023-11-28 ENCOUNTER — Ambulatory Visit: Payer: Self-pay | Admitting: Family Medicine

## 2023-11-28 ENCOUNTER — Encounter: Payer: Medicare Other | Admitting: Physical Medicine & Rehabilitation

## 2023-11-28 NOTE — Telephone Encounter (Signed)
  Chief Complaint: fever 101.9 this am  Symptoms: sore throat , vertigo, V/D, chills headache, able to eat. 2 pills left to take of levofloxacin ordered 11/20/23.  Frequency: today  Pertinent Negatives: Patient denies chest pain no difficulty breathing  Disposition: [] ED /[] Urgent Care (no appt availability in office) / [] Appointment(In office/virtual)/ []  [place] [facility]/ [] Home Care/ [x] Refused Recommended Disposition /[] [place] [facility]/ []  Follow-up with PCP Additional Notes:   Recommended UC due to no available appt. With PCP. Patient only wants to see PCP. Patient requesting to notify PCP she did not request refill for hyoscyamine but pharmacy did. She has only taken medication 5 times. Very difficult for NT to redirect patient with acute sx. Patient wants to review upcoming appt and make sure PCP notified of her current appt. Recent health issues with patient husband having heart attack and she would like to go to Dr appt with husband tomorrow. Advised patient due to her fever not to be around husband if she can help it. Patient requesting call back from PCP if she needs additional antibiotics or what else she should do for fever. Please advise.     Copied from CRM [id number]. Topic: Clinical - Red Word Triage >> Nov 28, 2023 12:19 PM Eunice Blase wrote: Red Word that prompted transfer to Nurse Triage: Pt called regarding fever 101.9 for an existing upper respiratory infection. Reason for Disposition  [1] Fever > 101 F (38.3 C) AND [2] age > 60 years  Answer Assessment - Initial Assessment Questions 1. TEMPERATURE: "What is the most recent temperature?"  "How was it measured?"      101.9 2. ONSET: "When did the fever start?"      11/20/23 3. CHILLS: "Do you have chills?" If yes: "How bad are they?"  (e.g., none, mild, moderate, severe)   - NONE: no chills   - MILD: feeling cold   - MODERATE: feeling very cold, some shivering (feels better under a thick blanket)   - SEVERE:  feeling extremely cold with shaking chills (general body shaking, rigors; even under a thick blanket)      Does have chills  4. OTHER SYMPTOMS: "Do you have any other symptoms besides the fever?"  (e.g., abdomen pain, cough, diarrhea, earache, headache, sore throat, urination pain)     Sore throat, vertigo , V/D chills, headache  5. CAUSE: If there are no symptoms, ask: "What do you think is causing the fever?"      URI 6. CONTACTS: "Does anyone else in the family have an infection?"     na 7. TREATMENT: "What have you done so far to treat this fever?" (e.g., medications)     levofloxacin 8. IMMUNOCOMPROMISE: "Do you have of the following: diabetes, HIV positive, splenectomy, cancer chemotherapy, chronic steroid treatment, transplant patient, etc."     na 9. PREGNANCY: "Is there any chance you are pregnant?" "When was your last menstrual period?"     na 10. TRAVEL: "Have you traveled out of the country in the last month?" (e.g., travel history, exposures)       na  Protocols used: [facility]

## 2023-11-29 ENCOUNTER — Other Ambulatory Visit: Payer: Self-pay | Admitting: Family Medicine

## 2023-11-29 ENCOUNTER — Ambulatory Visit: Payer: Self-pay | Admitting: Family Medicine

## 2023-11-29 ENCOUNTER — Telehealth: Payer: Self-pay | Admitting: Physical Medicine and Rehabilitation

## 2023-11-29 MED ORDER — PROMETHAZINE HCL 25 MG PO TABS: ORAL_TABLET | ORAL | 0 refills | Status: DC

## 2023-11-29 NOTE — Telephone Encounter (Signed)
 Pcp Responded.

## 2023-11-29 NOTE — Progress Notes (Signed)
 Called pt regarding her complaint of continued fever.  Advised her additional abx would not be beneficial at this time because it is likely a viral infection.  Pt states she plans on going to the urgent care today.  She also states she needs a refill of her promethazine which she previously received from her neurologist.  I have sent those in for her.

## 2023-11-29 NOTE — Telephone Encounter (Signed)
 Pt called stating she is sick and will came when she is better. No need for call back at this time. Pt states she left message on vm for Ashley L.

## 2023-11-30 ENCOUNTER — Encounter: Payer: Medicare Other | Admitting: Physical Medicine and Rehabilitation

## 2023-11-30 ENCOUNTER — Telehealth: Payer: Self-pay

## 2023-11-30 NOTE — Telephone Encounter (Signed)
 Pt states that was was advised to call back in 24hrs to speak to provider. Pt spoke with provider yesterday and advised that she was going to go to UC.   I confirmed if she went to UC and she didn't go stated that the diarrhea is to bad but will be going today at some point. Also pt didn't know if she had a fever today bc at the time of the call she hadnt checked the temperature.

## 2023-12-03 ENCOUNTER — Ambulatory Visit: Payer: Self-pay | Admitting: Licensed Clinical Social Worker

## 2023-12-03 NOTE — Patient Outreach (Signed)
 Care Coordination   Follow Up Visit Note   12/03/2023 Name: Patty Salas MRN: [id number] DOB: 1956-09-27  Patty Salas is a 68 y.o. year old female who sees Sandre Kitty, MD for primary care. I spoke with  Auburn Bilberry by phone today.  What matters to the patients health and wellness today?  SCAT Application     Goals Addressed             This Visit's Progress    Care Coordination Activities   On track    Care Coordination Interventions: Patient stated that she suffers from migraines and is under doctors care and has an appointment with her PCP Dr. Constance Goltz on tomorrow and she will drive herself.  Patient stated that she wanted the SW to proceed to complete the SCAT application The SW went over the SDOH needs and the patient did not have any other needs.  SW will follow up on 12/20/2023 at 1:00 pm        SDOH assessments and interventions completed:  Yes  SDOH Interventions Today    Flowsheet Row Most Recent Value  SDOH Interventions   Food Insecurity Interventions Intervention Not Indicated  Housing Interventions Intervention Not Indicated  Transportation Interventions SCAT (Specialized Community Area [employer]poration)  [SCAT Application still pending]  Utilities Interventions Intervention Not Indicated        Care Coordination Interventions:  Yes, provided  Interventions Today    Flowsheet Row Most Recent Value  General Interventions   General Interventions Discussed/Reviewed General Interventions Reviewed, Community Resources  [SCAT application still pending]        Follow up plan: Follow up call scheduled for 12/20/2023 at 1:00 pm    Encounter Outcome:  Patient Visit Completed   Jeanie Cooks, PhD [facility], [facility] Social Worker Direct Dial: [phone]  Fax: [phone]

## 2023-12-03 NOTE — Patient Instructions (Signed)
 Visit Information  Thank you for taking time to visit with me today. Please don't hesitate to contact me if I can be of assistance to you.   Following are the goals we discussed today:   Goals Addressed             This Visit's Progress    Care Coordination Activities   On track    Care Coordination Interventions: Patient stated that she suffers from migraines and is under doctors care and has an appointment with her PCP Dr. Constance Goltz on tomorrow and she will drive herself.  Patient stated that she wanted the SW to proceed to complete the SCAT application The SW went over the SDOH needs and the patient did not have any other needs.  SW will follow up on 12/20/2023 at 1:00 pm        Our next appointment is by telephone on 12/20/2023 at 1:00 pm  Please call the care guide team at [phone] if you need to cancel or reschedule your appointment.   If you are experiencing a Mental Health or Behavioral Health Crisis or need someone to talk to, please call the Suicide and Crisis Lifeline: 988 go to [facility] Urgent [facility] [address], [place] [phone]) call 911  Patient verbalizes understanding of instructions and care plan provided today and agrees to view in MyChart. Active MyChart status and patient understanding of how to access instructions and care plan via MyChart confirmed with patient.     Jeanie Cooks, PhD [facility], [facility] Social Worker Direct Dial: [phone]  Fax: [phone]

## 2023-12-06 ENCOUNTER — Ambulatory Visit: Payer: Self-pay

## 2023-12-06 NOTE — Patient Instructions (Signed)
 Visit Information  Thank you for taking time to visit with me today. Please don't hesitate to contact me if I can be of assistance to you.   Following are the goals we discussed today:   Goals Addressed             This Visit's Progress    Patient Stated-My concerns are IBS and Migraines       Care Coordination Interventions: Evaluation of current treatment plan related to patient's adherence to plan as established by provider Provided education to patient and/or caregiver about advanced directives Reviewed medications with patient and discussed impotance Active listening / Reflection utilized  Emotional Support Provided Problem Solving /Task Center strategies reviewed cook homemade meals using fresh ingredients when you can. keep a diary of what you eat and any symptoms you get  avoid things that trigger your IBS. try to find ways to relax. get exercise. continue probiotics   Continue current medications  Migraines Take migraine medication as directed  Avoid stress anxiety Dehydration  Avoid caffeine Certain scents          Our next appointment is by telephone on 12/20/23 at 1 pm  Please call the care guide team at [phone] if you need to cancel or reschedule your appointment.   If you are experiencing a Mental Health or Behavioral Health Crisis or need someone to talk to, please call 1-800-273-TALK (toll free, 24 hour hotline)  Patient verbalizes understanding of instructions and care plan provided today and agrees to view in MyChart. Active MyChart status and patient understanding of how to access instructions and care plan via MyChart confirmed with patient.     Juanell Fairly RN, BSN, [facility]  [facility], [facility]  Care Coordinator Phone: [phone]

## 2023-12-06 NOTE — Patient Outreach (Signed)
 Care Coordination   Follow Up Visit Note   12/06/2023 Name: Patty Salas MRN: [id number] DOB: 05/27/1956  Patty Salas is a 68 y.o. year old female who sees Sandre Kitty, MD for primary care. I spoke with  Patty Salas by phone today.  What matters to the patients health and wellness today?  Patty Salas has reported an improvement in her irritable bowel syndrome (IBS), attributing this progress to her current regimen of medications, which includes 25 milligrams of promethazine, 0.125 milligrams of hyoscyamine, and 500 milligrams of levofloxacin. She is currently experiencing a migraine but expresses a preference for limiting her medication intake.  Patty Salas indicated that she can drive; however, she has concerns regarding appointments involving medications or procedures affecting her ability to operate a vehicle. Consequently, she requires assistance with transportation. I will arrange to discuss her circumstances with the social worker for further support.     Goals Addressed             This Visit's Progress    Patient Stated-My concerns are IBS and Migraines       Care Coordination Interventions: Evaluation of current treatment plan related to patient's adherence to plan as established by provider Provided education to patient and/or caregiver about advanced directives Reviewed medications with patient and discussed impotance Active listening / Reflection utilized  Emotional Support Provided Problem Solving /Task Center strategies reviewed cook homemade meals using fresh ingredients when you can. keep a diary of what you eat and any symptoms you get  avoid things that trigger your IBS. try to find ways to relax. get exercise. continue probiotics   Continue current medications  Migraines Take migraine medication as directed  Avoid stress anxiety Dehydration  Avoid caffeine Certain scents          SDOH assessments and interventions completed:  Yes  SDOH  Interventions Today    Flowsheet Row Most Recent Value  SDOH Interventions   Food Insecurity Interventions Intervention Not Indicated  Housing Interventions Intervention Not Indicated  Transportation Interventions Intervention Not Indicated  Utilities Interventions Intervention Not Indicated        Care Coordination Interventions:  Yes, provided   Interventions Today    Flowsheet Row Most Recent Value  Chronic Disease   Chronic disease during today's visit Other  [IBS Migraines]  General Interventions   General Interventions Discussed/Reviewed General Interventions Discussed, General Interventions Reviewed  Nutrition Interventions   Nutrition Discussed/Reviewed Fluid intake  Pharmacy Interventions   Pharmacy Dicussed/Reviewed Pharmacy Topics Discussed, Pharmacy Topics Reviewed  Safety Interventions   Safety Discussed/Reviewed Safety Discussed        Follow up plan: Follow up call scheduled for 12/20/23  1 pm    Encounter Outcome:  Patient Visit Completed   Juanell Fairly RN, BSN, [facility]  [facility], [facility]  Care Coordinator Phone: [phone]

## 2023-12-11 NOTE — Progress Notes (Deleted)
 Subjective:    Patient ID: Patty Salas, female    DOB: Jul 25, 1956, 68 y.o.   MRN: [id number]  HPI  Pain Inventory Average Pain {NUMBERS; 0-10:5044} Pain Right Now {NUMBERS; 0-10:5044} My pain is {PAIN DESCRIPTION:21022940}  In the last 24 hours, has pain interfered with the following? General activity {NUMBERS; 0-10:5044} Relation with others {NUMBERS; 0-10:5044} Enjoyment of life {NUMBERS; 0-10:5044} What TIME of day is your pain at its worst? {time of day:24191} Sleep (in general) {BHH GOOD/FAIR/POOR:22877}  Pain is worse with: {ACTIVITIES:21022942} Pain improves with: {PAIN IMPROVES WUJW:11914782} Relief from Meds: {NUMBERS; 0-10:5044}  Family History  Problem Relation Age of Onset   Cancer Mother        stomach   Migraines Mother    Arthritis Mother    Emphysema Mother    Heart disease Father    Hypertension Father    Cancer Father        colon   Dementia Father    Hypertension Brother    Migraines Brother    Hypertension Brother    Migraines Brother    Alcohol abuse Brother    Bipolar disorder Brother    Migraines Daughter    Arthritis Maternal Grandmother    Cancer Maternal Grandmother        stomach   Diabetes Maternal Grandmother    Stroke Maternal Grandmother    Cancer Maternal Aunt        lung   Emphysema Maternal Aunt    Cancer Paternal Uncle        colon   Hypertension Maternal Aunt    Heart disease Maternal Aunt    Cancer Paternal Uncle        lung   Social History   Socioeconomic History   Marital status: Married    Spouse name: Not on file   Number of children: 1   Years of education: COLLEGE1   Highest education level: Some college, no degree  Occupational History   Occupation: HOUSEWIFE    Associate Professor: UNEMPLOYED  Tobacco Use   Smoking status: Never   Smokeless tobacco: Never  Vaping Use   Vaping status: Never Used  Substance and Sexual Activity   Alcohol use: No   Drug use: No   Sexual activity: Not on file  Other Topics  Concern   Not on file  Social History Narrative   Patient is right handed.   Patient drinks caffeine occasionally.   Social Drivers of Corporate investment banker Strain: Low Risk  (11/19/2023)   Overall Financial Resource Strain (CARDIA)    Difficulty of Paying Living Expenses: Not very hard  Food Insecurity: No Food Insecurity (12/03/2023)   Hunger Vital Sign    Worried About Running Out of Food in the Last Year: Never true    Ran Out of Food in the Last Year: Never true  Transportation Needs: Unmet Transportation Needs (12/03/2023)   PRAPARE - Administrator, Civil Service (Medical): Yes    Lack of Transportation (Non-Medical): Yes  Physical Activity: Inactive (11/19/2023)   Exercise Vital Sign    Days of Exercise per Week: 0 days    Minutes of Exercise per Session: 0 min  Stress: No Stress Concern Present (11/19/2023)   [employer] of Occupational Health - Occupational Stress Questionnaire    Feeling of Stress : Only a little  Social Connections: Moderately Integrated (11/19/2023)   Social Connection and Isolation Panel [NHANES]    Frequency of Communication with Friends and Family: More  than three times a week    Frequency of Social Gatherings with Friends and Family: Once a week    Attends Religious Services: More than 4 times per year    Active Member of Clubs or Organizations: No    Attends Engineer, structural: Never    Marital Status: Married   Past Surgical History:  Procedure Laterality Date    kidney stones     ABDOMINAL ADHESION SURGERY     ABDOMINAL HYSTERECTOMY  1995   partial- hemmoraged after surgery-stitch came loose   APPENDECTOMY  1993   hemmoraged after surgery- stitch came loose   CERVICAL DISC SURGERY  06/04/2001   with fusion   CHOLECYSTECTOMY  1985   colonoscopy     COLONOSCOPY     CYSTOSCOPY     FOOT SURGERY     lt   INCISIONAL HERNIA REPAIR N/A 04/22/2021   Procedure: LAPAROSCOPIC INCISIONAL HERNIA REPAIR WITH MESH;   Surgeon: Berna Bue, MD;  Location: [facility];  Service: General;  Laterality: N/A;   jj stent  07/2002   with ureteroscopy, cystoscopy and then removal of stent in office   LAPAROSCOPIC LYSIS OF ADHESIONS N/A 04/22/2021   Procedure: LYSIS OF ADHESIONS;  Surgeon: Berna Bue, MD;  Location: [facility];  Service: General;  Laterality: N/A;   LITHOTRIPSY     LYMPH NODE BIOPSY Right 03/06/2014   Procedure: right groin LYMPH NODE BIOPSY;  Surgeon: Robyne Askew, MD;  Location: [facility];  Service: General;  Laterality: Right;   LYMPHADENECTOMY N/A 12/29/2015   Procedure: RETROPERITONEAL LYMPHADENECTOMY;  Surgeon: Sebastian Ache, MD;  Location: [facility];  Service: Urology;  Laterality: N/A;   NECK SURGERY  2002   ROBOT ASSISTED LAPAROSCOPIC NEPHRECTOMY Left 12/29/2015   Procedure: XI ROBOTIC ASSISTED LEFT LAPAROSCOPIC NEPHRECTOMY;  Surgeon: Sebastian Ache, MD;  Location: [facility];  Service: Urology;  Laterality: Left;   TONSILLECTOMY  1976   Past Surgical History:  Procedure Laterality Date    kidney stones     ABDOMINAL ADHESION SURGERY     ABDOMINAL HYSTERECTOMY  1995   partial- hemmoraged after surgery-stitch came loose   APPENDECTOMY  1993   hemmoraged after surgery- stitch came loose   CERVICAL DISC SURGERY  06/04/2001   with fusion   CHOLECYSTECTOMY  1985   colonoscopy     COLONOSCOPY     CYSTOSCOPY     FOOT SURGERY     lt   INCISIONAL HERNIA REPAIR N/A 04/22/2021   Procedure: LAPAROSCOPIC INCISIONAL HERNIA REPAIR WITH MESH;  Surgeon: Berna Bue, MD;  Location: [facility];  Service: General;  Laterality: N/A;   jj stent  07/2002   with ureteroscopy, cystoscopy and then removal of stent in office   LAPAROSCOPIC LYSIS OF ADHESIONS N/A 04/22/2021   Procedure: LYSIS OF ADHESIONS;  Surgeon: Berna Bue, MD;  Location: [facility];  Service: General;  Laterality: N/A;   LITHOTRIPSY     LYMPH NODE BIOPSY Right 03/06/2014   Procedure: right groin LYMPH NODE BIOPSY;   Surgeon: Robyne Askew, MD;  Location: [facility];  Service: General;  Laterality: Right;   LYMPHADENECTOMY N/A 12/29/2015   Procedure: RETROPERITONEAL LYMPHADENECTOMY;  Surgeon: Sebastian Ache, MD;  [facility];  Service: Urology;  Laterality: N/A;   NECK SURGERY  2002   ROBOT ASSISTED LAPAROSCOPIC NEPHRECTOMY Left 12/29/2015   Procedure: XI ROBOTIC ASSISTED LEFT LAPAROSCOPIC NEPHRECTOMY;  Surgeon: Sebastian Ache, MD;  Location: [facility];  Service: Urology;  Laterality: Left;   TONSILLECTOMY  1976   Past Medical History:  Diagnosis Date   Anxiety    Arthritis    Asthma    Bipolar 2 disorder (HCC)    pt stated, "I have Bipolar 2 and it is remission"   Bipolar affective (HCC)    CAD (coronary artery disease), native artery transplanted heart    Nonobstructive by coronary CTA 08/2021 with less than 25% stenosis in the LAD, RCA and left circumflex.   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   Full dentures    Gout    Headache(784.0)    migraines   Herpesviral infection    Hypertension    IBS (irritable bowel syndrome)    with diarrhea   Lymphadenopathy    Myalgia and myositis, unspecified    Nephrolithiasis 02/11/2013   PAT (paroxysmal atrial tachycardia) (HCC)    asymptomatic 10 beat run on event monitor 09/2020   PFO (patent foramen ovale)    Small PFO noted on coronary CTA   Pneumonia    PONV (postoperative nausea and vomiting)    PTSD (post-traumatic stress disorder)    Renal calculi    Restless legs syndrome (RLS)    Thoracic radiculopathy    Vitamin D deficiency    Wears glasses    There were no vitals taken for this visit.  Opioid Risk Score:   Fall Risk Score:  `1  Depression screen [facility] 2/9     10/11/2023    11:49 AM 08/27/2023    1:54 PM 07/31/2023    2:20 PM 07/18/2023   11:09 AM 06/15/2023   10:38 AM 11/17/2022    1:08 PM 10/18/2022   10:32 AM  Depression screen PHQ 2/9  Decreased Interest 1 0 1 1 0 0 0  Down, Depressed, Hopeless 1 0 3 1 0 0 1  PHQ - 2 Score 2 0 4 2 0 0 1  Altered sleeping   0  0    Tired, decreased energy   0  1    Change in appetite   0  0    Feeling bad or failure about yourself   0 0  0    Trouble concentrating   0  0    Moving slowly or fidgety/restless   0  1    Suicidal thoughts   0  0    PHQ-9 Score   4  2    Difficult doing work/chores   Somewhat difficult  Not difficult at all       Review of Systems     Objective:   Physical Exam        Assessment & Plan:

## 2023-12-12 ENCOUNTER — Encounter: Payer: Medicare Other | Admitting: Registered Nurse

## 2023-12-14 ENCOUNTER — Encounter: Admitting: Physical Medicine and Rehabilitation

## 2023-12-15 ENCOUNTER — Other Ambulatory Visit: Payer: Self-pay | Admitting: Cardiology

## 2023-12-17 ENCOUNTER — Encounter: Payer: Self-pay | Admitting: Registered Nurse

## 2023-12-17 ENCOUNTER — Encounter: Attending: Physical Medicine & Rehabilitation | Admitting: Registered Nurse

## 2023-12-17 DIAGNOSIS — G894 Chronic pain syndrome: Secondary | ICD-10-CM | POA: Insufficient documentation

## 2023-12-17 DIAGNOSIS — M5412 Radiculopathy, cervical region: Secondary | ICD-10-CM | POA: Insufficient documentation

## 2023-12-17 DIAGNOSIS — M47812 Spondylosis without myelopathy or radiculopathy, cervical region: Secondary | ICD-10-CM | POA: Diagnosis not present

## 2023-12-17 DIAGNOSIS — M797 Fibromyalgia: Secondary | ICD-10-CM | POA: Insufficient documentation

## 2023-12-17 DIAGNOSIS — M5416 Radiculopathy, lumbar region: Secondary | ICD-10-CM | POA: Insufficient documentation

## 2023-12-17 DIAGNOSIS — M542 Cervicalgia: Secondary | ICD-10-CM | POA: Diagnosis not present

## 2023-12-17 DIAGNOSIS — M7061 Trochanteric bursitis, right hip: Secondary | ICD-10-CM | POA: Insufficient documentation

## 2023-12-17 DIAGNOSIS — G43519 Persistent migraine aura without cerebral infarction, intractable, without status migrainosus: Secondary | ICD-10-CM | POA: Insufficient documentation

## 2023-12-17 DIAGNOSIS — M546 Pain in thoracic spine: Secondary | ICD-10-CM | POA: Insufficient documentation

## 2023-12-17 DIAGNOSIS — M7062 Trochanteric bursitis, left hip: Secondary | ICD-10-CM | POA: Insufficient documentation

## 2023-12-17 DIAGNOSIS — G8929 Other chronic pain: Secondary | ICD-10-CM | POA: Insufficient documentation

## 2023-12-17 DIAGNOSIS — M25562 Pain in left knee: Secondary | ICD-10-CM | POA: Insufficient documentation

## 2023-12-17 MED ORDER — HYDROCODONE-ACETAMINOPHEN 5-325 MG PO TABS
1.0000 | ORAL_TABLET | Freq: Three times a day (TID) | ORAL | 0 refills | Status: DC | PRN
Start: 2023-12-17 — End: 2024-02-19

## 2023-12-17 MED ORDER — ALMOTRIPTAN MALATE 12.5 MG PO TABS: 12.5000 mg | ORAL_TABLET | ORAL | 1 refills | Status: DC | PRN

## 2023-12-17 MED ORDER — HYDROCODONE-ACETAMINOPHEN 5-325 MG PO TABS: 1.0000 | ORAL_TABLET | Freq: Three times a day (TID) | ORAL | 0 refills | Status: DC | PRN

## 2023-12-17 MED ORDER — CYCLOBENZAPRINE HCL 10 MG PO TABS: 10.0000 mg | ORAL_TABLET | Freq: Three times a day (TID) | ORAL | 4 refills | Status: DC

## 2023-12-17 NOTE — Progress Notes (Signed)
 Subjective:    Patient ID: Patty Salas, female    DOB: 1955/11/18, 68 y.o.   MRN: [id number]  HPI: Patty Salas is a 68 y.o. female who is scheduled for a Virtual visit, she called office reporting she was having vertigo. Her appointment was changed to a Virtual visit. She was also instructed to go to Urgent Care or ED for Evaluation, she verbalizes understanding.   I connected with Patty Salas. Wierman  on 12/17/2023 by a video enabled telemedicine application and verified that I am speaking with the correct person using two identifiers.  Location: Patient: In her home Provider: In the office    I discussed the limitations of evaluation and management by telemedicine and the availability of in person appointments. The patient expressed understanding and agreed to proceed. She states her pain is located in her neck radiating into her bilateral shoulders, mid- lower back radiating into her bilateral lower extremities, also reports bilateral hip pain and left knee pain. She rates her pain 7. Her current exercise regime is walking short distance, she reports she is not following her exercise regimen today due to Vertigo.   [name] states she was dismissed from [facility], she will need a refill on her Almotriptan, she has a scheduled appointment with Dr Patty Salas on 04/02/2024, she reports she is on the waiting list. Refill sent to the pharmacy, she verbalizes understanding.   [name] Morphine equivalent is 15.38 MME. She is also prescribed Lorazepam  by Dr. Betti Salas  .We have discussed the black box warning of using opioids and benzodiazepines. I highlighted the dangers of using these drugs together and discussed the adverse events including respiratory suppression, overdose, cognitive impairment and importance of compliance with current regimen. We will continue to monitor and adjust as indicated.  she is being closely monitored and under the care of her psychiatrist.     Her Last UDS was Performed  10/11/2023, it was consistent.      Pain Inventory Average Pain 7 Pain Right Now 7 My pain is sharp, burning, dull, stabbing, tingling, and aching  In the last 24 hours, has pain interfered with the following? General activity 7 Relation with others 7 Enjoyment of life 7 What TIME of day is your pain at its worst? varies Sleep (in general) Fair  Pain is worse with: walking, bending, sitting, standing, and some activites Pain improves with: rest, heat/ice, therapy/exercise, pacing activities, medication, and injections Relief from Meds: 5  Family History  Problem Relation Age of Onset   Cancer Mother        stomach   Migraines Mother    Arthritis Mother    Emphysema Mother    Heart disease Father    Hypertension Father    Cancer Father        colon   Dementia Father    Hypertension Brother    Migraines Brother    Hypertension Brother    Migraines Brother    Alcohol abuse Brother    Bipolar disorder Brother    Migraines Daughter    Arthritis Maternal Grandmother    Cancer Maternal Grandmother        stomach   Diabetes Maternal Grandmother    Stroke Maternal Grandmother    Cancer Maternal Aunt        lung   Emphysema Maternal Aunt    Cancer Paternal Uncle        colon   Hypertension Maternal Aunt    Heart disease Maternal Aunt  Cancer Paternal Uncle        lung   Social History   Socioeconomic History   Marital status: Married    Spouse name: Not on file   Number of children: 1   Years of education: COLLEGE1   Highest education level: Some college, no degree  Occupational History   Occupation: HOUSEWIFE    Associate Professor: UNEMPLOYED  Tobacco Use   Smoking status: Never   Smokeless tobacco: Never  Vaping Use   Vaping status: Never Used  Substance and Sexual Activity   Alcohol use: No   Drug use: No   Sexual activity: Not on file  Other Topics Concern   Not on file  Social History Narrative   Patient is right handed.   Patient drinks caffeine  occasionally.   Social Drivers of Corporate investment banker Strain: Low Risk  (11/19/2023)   Overall Financial Resource Strain (CARDIA)    Difficulty of Paying Living Expenses: Not very hard  Food Insecurity: No Food Insecurity (12/03/2023)   Hunger Vital Sign    Worried About Running Out of Food in the Last Year: Never true    Ran Out of Food in the Last Year: Never true  Transportation Needs: Unmet Transportation Needs (12/03/2023)   PRAPARE - Administrator, Civil Service (Medical): Yes    Lack of Transportation (Non-Medical): Yes  Physical Activity: Inactive (11/19/2023)   Exercise Vital Sign    Days of Exercise per Week: 0 days    Minutes of Exercise per Session: 0 min  Stress: No Stress Concern Present (11/19/2023)   [employer] of Occupational Health - Occupational Stress Questionnaire    Feeling of Stress : Only a little  Social Connections: Moderately Integrated (11/19/2023)   Social Connection and Isolation Panel [NHANES]    Frequency of Communication with Friends and Family: More than three times a week    Frequency of Social Gatherings with Friends and Family: Once a week    Attends Religious Services: More than 4 times per year    Active Member of [employer] or Organizations: No    Attends Engineer, structural: Never    Marital Status: Married   Past Surgical History:  Procedure Laterality Date    kidney stones     ABDOMINAL ADHESION SURGERY     ABDOMINAL HYSTERECTOMY  1995   partial- hemmoraged after surgery-stitch came loose   APPENDECTOMY  1993   hemmoraged after surgery- stitch came loose   CERVICAL DISC SURGERY  06/04/2001   with fusion   CHOLECYSTECTOMY  1985   colonoscopy     COLONOSCOPY     CYSTOSCOPY     FOOT SURGERY     lt   INCISIONAL HERNIA REPAIR N/A 04/22/2021   Procedure: LAPAROSCOPIC INCISIONAL HERNIA REPAIR WITH MESH;  Surgeon: Berna Bue, MD;  Location: [facility];  Service: General;  Laterality: N/A;   jj stent  07/2002    with ureteroscopy, cystoscopy and then removal of stent in office   LAPAROSCOPIC LYSIS OF ADHESIONS N/A 04/22/2021   Procedure: LYSIS OF ADHESIONS;  Surgeon: Berna Bue, MD;  Location: [facility];  Service: General;  Laterality: N/A;   LITHOTRIPSY     LYMPH NODE BIOPSY Right 03/06/2014   Procedure: right groin LYMPH NODE BIOPSY;  Surgeon: Robyne Askew, MD;  Location: [facility];  Service: General;  Laterality: Right;   LYMPHADENECTOMY N/A 12/29/2015   Procedure: RETROPERITONEAL LYMPHADENECTOMY;  Surgeon: Sebastian Ache, MD;  Location: [facility];  Service: Urology;  Laterality: N/A;   NECK SURGERY  2002   ROBOT ASSISTED LAPAROSCOPIC NEPHRECTOMY Left 12/29/2015   Procedure: XI ROBOTIC ASSISTED LEFT LAPAROSCOPIC NEPHRECTOMY;  Surgeon: Sebastian Ache, MD;  [facility];  Service: Urology;  Laterality: Left;   TONSILLECTOMY  1976   Past Surgical History:  Procedure Laterality Date    kidney stones     ABDOMINAL ADHESION SURGERY     ABDOMINAL HYSTERECTOMY  1995   partial- hemmoraged after surgery-stitch came loose   APPENDECTOMY  1993   hemmoraged after surgery- stitch came loose   CERVICAL DISC SURGERY  06/04/2001   with fusion   CHOLECYSTECTOMY  1985   colonoscopy     COLONOSCOPY     CYSTOSCOPY     FOOT SURGERY     lt   INCISIONAL HERNIA REPAIR N/A 04/22/2021   Procedure: LAPAROSCOPIC INCISIONAL HERNIA REPAIR WITH MESH;  Surgeon: Berna Bue, MD;  [facility];  Service: General;  Laterality: N/A;   jj stent  07/2002   with ureteroscopy, cystoscopy and then removal of stent in office   LAPAROSCOPIC LYSIS OF ADHESIONS N/A 04/22/2021   Procedure: LYSIS OF ADHESIONS;  Surgeon: Berna Bue, MD;  Location: [facility];  Service: General;  Laterality: N/A;   LITHOTRIPSY     LYMPH NODE BIOPSY Right 03/06/2014   Procedure: right groin LYMPH NODE BIOPSY;  Surgeon: Robyne Askew, MD;  Location: [facility];  Service: General;  Laterality: Right;    LYMPHADENECTOMY N/A 12/29/2015   Procedure: RETROPERITONEAL LYMPHADENECTOMY;  Surgeon: Sebastian Ache, MD;  [facility];  Service: Urology;  Laterality: N/A;   NECK SURGERY  2002   ROBOT ASSISTED LAPAROSCOPIC NEPHRECTOMY Left 12/29/2015   Procedure: XI ROBOTIC ASSISTED LEFT LAPAROSCOPIC NEPHRECTOMY;  Surgeon: Sebastian Ache, MD;  [facility];  Service: Urology;  Laterality: Left;   TONSILLECTOMY  1976   Past Medical History:  Diagnosis Date   Anxiety    Arthritis    Asthma    Bipolar 2 disorder (HCC)    pt stated, "I have Bipolar 2 and it is remission"   Bipolar affective (HCC)    CAD (coronary artery disease), native artery transplanted heart    Nonobstructive by coronary CTA 08/2021 with less than 25% stenosis in the LAD, RCA and left circumflex.   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   Full dentures    Gout    Headache(784.0)    migraines   Herpesviral infection    Hypertension    IBS (irritable bowel syndrome)    with diarrhea   Lymphadenopathy    Myalgia and myositis, unspecified    Nephrolithiasis 02/11/2013   PAT (paroxysmal atrial tachycardia) (HCC)    asymptomatic 10 beat run on event monitor 09/2020   PFO (patent foramen ovale)    Small PFO noted on coronary CTA   Pneumonia    PONV (postoperative nausea and vomiting)    PTSD (post-traumatic stress disorder)    Renal calculi    Restless legs syndrome (RLS)    Thoracic radiculopathy    Vitamin D deficiency    Wears glasses    There were no  vitals taken for this visit.  Opioid Risk Score:   Fall Risk Score:  `1  Depression screen [facility] 2/9     10/11/2023   11:49 AM 08/27/2023    1:54 PM 07/31/2023    2:20 PM 07/18/2023   11:09 AM 06/15/2023   10:38 AM 11/17/2022     1:08 PM 10/18/2022   10:32 AM  Depression screen PHQ 2/9  Decreased Interest 1 0 1 1 0 0 0  Down, Depressed, Hopeless 1 0 3 1 0 0 1  PHQ - 2 Score 2 0 4 2 0 0 1  Altered sleeping   0  0    Tired, decreased energy   0  1    Change in appetite   0  0    Feeling bad or failure about yourself   0 0  0    Trouble concentrating   0  0    Moving slowly or fidgety/restless   0  1    Suicidal thoughts   0  0    PHQ-9 Score   4  2    Difficult doing work/chores   Somewhat difficult  Not difficult at all      Review of Systems  Musculoskeletal:  Positive for arthralgias, back pain and gait problem.  Neurological:  Positive for dizziness and headaches.  All other systems reviewed and are negative.     Objective:   Physical Exam Vitals and nursing note reviewed.  Musculoskeletal:     Comments: No Physical Exam: Virtual Visit          Assessment & Plan:  History of fibromyalgia with myofascial pain and multiple trigger points.Continue with Heat and exercise Regime.   Continue with current medication regimen with  gabapentin. 12/17/2023 2 Chronic migraine headaches..Continue current medication regimen with Almotriptan, she has a scheduled appointment wth Dr Patty Salas on 04/02/2024 Continue to Monitor. S/P Botox.on 05/30/2017. 12/17/2023 3. Midline Low Back Pain/ Lumbar Spondylosis/Lumbar degenerative disk disease, L4-5/ Lumbar Radiculitis: Continue with HEP and current medication regimen. Continue Hydrocodone 5/325 mg one tablet every 8 hours as needed #80. Marland KitchenContinue with slow weaning of Hydrocodone.  12/17/2023 4. 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 Urology Following. 12/17/2023. 5. 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 12/17/2023 6. Cervicalgia/ Cervical RadiculitisCervical Spondylosis:  Continue current medication regiment with  Gabapentin. Continue  with HEP and Continue to monitor. 12/17/2023 7. Muscle Spasm: Myofascial Pain: She is svcheduled  for Trigger Point Injection with Dr Riley Kill : Continue current medication regimen with Flexeril as needed. 12/17/2023 8. Bilateral Greater Trochanter Bursitis:  Continue to alternate with ice and heat therapy. Continue HEP as Tolerated. Continue to monitor. 12/17/2023 9. Bilateral Knee Pain: S/P Knee Injection by Dr Lajoyce Corners on 02/23/2020. Orthopedics Following. Continue to Monitor. 12/17/2023  10. Left Groin Pain: No complaints today.Ms. Raphael reports Oncology Following. Continue to Monitor. 12/17/2023.  11. Fall at Home: No Falls this month. Educated on falls prevention, she verbalizes understanding. Continue to Monitor. 12/17/2023  12. Lollie Sails Tenosynovitis: Ortho Following. . We will Continue to Monitor. 12/17/2023     F/U in 1 month Virtual Visit Established Patient Location of patient: In her Home Location of Provider: In the Office

## 2023-12-20 ENCOUNTER — Ambulatory Visit: Payer: Self-pay | Admitting: Licensed Clinical Social Worker

## 2023-12-20 NOTE — Patient Outreach (Signed)
 Care Coordination   Follow Up Visit Note   12/20/2023 Name: Patty Salas MRN: [id number] DOB: 1956/09/06  Patty Salas is a 68 y.o. year old female who sees Sandre Kitty, MD for primary care. I spoke with  Auburn Bilberry by phone today.  What matters to the patients health and wellness today?  Transportation    Goals Addressed             This Visit's Progress    Care Coordination Activities       Care Coordination Interventions: Patient stated that she suffers from migraines and is under doctors care and has an appointment with her PCP Dr. Constance Goltz on tomorrow and she will drive herself.  Patient stated that she wanted the SW to proceed to complete the SCAT application The SW went over the SDOH needs and the patient did not have any other needs.  SW will follow up on 01/10/2024 at 1:00 pm        SDOH assessments and interventions completed:  Yes  SDOH Interventions Today    Flowsheet Row Most Recent Value  SDOH Interventions   Food Insecurity Interventions Intervention Not Indicated  Housing Interventions Intervention Not Indicated  Transportation Interventions Community Resources Provided  [SW will send the [facility] Transportation and Mobility application to the patient]  Utilities Interventions Intervention Not Indicated        Care Coordination Interventions:  Yes, provided  Interventions Today    Flowsheet Row Most Recent Value  General Interventions   General Interventions Discussed/Reviewed General Interventions Reviewed  [SW will mail the [facility] transportaion and Mobility application to the patient]        Follow up plan: Follow up call scheduled for 01/10/2024 at 1:00 pm    Encounter Outcome:  Patient Visit Completed   Jeanie Cooks, PhD [facility], [facility] Social Worker Direct Dial: [phone]  Fax: [phone]

## 2023-12-20 NOTE — Patient Instructions (Signed)
 Visit Information  Thank you for taking time to visit with me today. Please don't hesitate to contact me if I can be of assistance to you.   Following are the goals we discussed today:   Goals Addressed             This Visit's Progress    Care Coordination Activities   On track    Care Coordination Interventions: Patient stated that she suffers from migraines and is under doctors care and has an appointment with her PCP Dr. Constance Goltz on tomorrow and she will drive herself.  Patient stated taht she was notified by [facility] that was outside of their pickup area. Sw mailed the [facility] transportation Application to the patient .  The SW went over the SDOH needs and the patient did not have any other needs.  SW will follow up on 01/10/2024 at 1:00 pm        Our next appointment is by telephone on 01/10/2024 at 1:00 pm  Please call the care guide team at [phone] if you need to cancel or reschedule your appointment.   If you are experiencing a Mental Health or Behavioral Health Crisis or need someone to talk to, please call the Suicide and Crisis Lifeline: 988 go to [facility] [address], [place] [phone]) call 911  Patient verbalizes understanding of instructions and care plan provided today and agrees to view in MyChart. Active MyChart status and patient understanding of how to access instructions and care plan via MyChart confirmed with patient.     Jeanie Cooks, PhD [facility], [facility] Social Worker Direct Dial: [phone]  Fax: [phone]

## 2023-12-25 ENCOUNTER — Ambulatory Visit (INDEPENDENT_AMBULATORY_CARE_PROVIDER_SITE_OTHER): Admitting: Physical Medicine and Rehabilitation

## 2023-12-25 DIAGNOSIS — M79642 Pain in left hand: Secondary | ICD-10-CM | POA: Diagnosis not present

## 2023-12-25 DIAGNOSIS — M25532 Pain in left wrist: Secondary | ICD-10-CM

## 2023-12-25 DIAGNOSIS — R202 Paresthesia of skin: Secondary | ICD-10-CM | POA: Diagnosis not present

## 2023-12-25 DIAGNOSIS — M654 Radial styloid tenosynovitis [de Quervain]: Secondary | ICD-10-CM

## 2023-12-25 NOTE — Procedures (Unsigned)
 EMG & NCV Findings: All nerve conduction studies (as indicated in the following tables) were within normal limits.    All examined muscles (as indicated in the following table) showed no evidence of electrical instability.    Impression: Essentially NORMAL electrodiagnostic study of the left upper limb.  There is no significant electrodiagnostic evidence of nerve entrapment, brachial plexopathy or cervical radiculopathy.    As you know, purely sensory or demyelinating radiculopathies and chemical radiculitis may not be detected with this particular electrodiagnostic study.  Recommendations: 1.  Follow-up with referring physician. 2.  Continue current management of symptoms.  ___________________________ Naaman Plummer FAAPMR Board Certified, American Board of Physical Medicine and Rehabilitation    Nerve Conduction Studies Anti Sensory Summary Table   Stim Site NR Peak (ms) Norm Peak (ms) P-T Amp (V) Norm P-T Amp Site1 Site2 Delta-P (ms) Dist (cm) Vel (m/s) Norm Vel (m/s)  Left Median Acr Palm Anti Sensory (2nd Digit)  32C  Wrist    3.2 <3.6 18.9 >10 Wrist Palm 1.4 0.0    Palm    1.8 <2.0 23.9         Left Radial Anti Sensory (Base 1st Digit)  32.7C  Wrist    2.0 <3.1 24.4  Wrist Base 1st Digit 2.0 0.0    Left Ulnar Anti Sensory (5th Digit)  32.8C  Wrist    3.4 <3.7 17.7 >15.0 Wrist 5th Digit 3.4 14.0 41 >38   Motor Summary Table   Stim Site NR Onset (ms) Norm Onset (ms) O-P Amp (mV) Norm O-P Amp Site1 Site2 Delta-0 (ms) Dist (cm) Vel (m/s) Norm Vel (m/s)  Left Median Motor (Abd Poll Brev)  33C  Wrist    2.8 <4.2 9.2 >5 Elbow Wrist 3.5 19.5 56 >50  Elbow    6.3  4.5         Left Ulnar Motor (Abd Dig Min)  33.1C  Wrist    2.9 <4.2 7.1 >3 B Elbow Wrist 2.9 19.0 66 >53  B Elbow    5.8  8.2  A Elbow B Elbow 1.2 10.0 83 >53  A Elbow    7.0  6.6          EMG   Side Muscle Nerve Root Ins Act Fibs Psw Amp Dur Poly Recrt Int Dennie Bible Comment  Left Abd Poll Brev Median C8-T1 Nml Nml  Nml Nml Nml 0 Nml Nml   Left 1stDorInt Ulnar C8-T1 Nml Nml Nml Nml Nml 0 Nml Nml   Left Deltoid Axillary C5-6 Nml Nml Nml Nml Nml 0 Nml Nml   Left Triceps Radial C6-7-8 Nml Nml Nml Nml Nml 0 Nml Nml   Left Ext Digitorum  Radial (Post Int) C7-8 Nml Nml Nml Nml Nml 0 Nml Nml     Nerve Conduction Studies Anti Sensory Left/Right Comparison   Stim Site L Lat (ms) R Lat (ms) L-R Lat (ms) L Amp (V) R Amp (V) L-R Amp (%) Site1 Site2 L Vel (m/s) R Vel (m/s) L-R Vel (m/s)  Median Acr Palm Anti Sensory (2nd Digit)  32C  Wrist 3.2   18.9   Wrist Palm     Palm 1.8   23.9         Radial Anti Sensory (Base 1st Digit)  32.7C  Wrist 2.0   24.4   Wrist Base 1st Digit     Ulnar Anti Sensory (5th Digit)  32.8C  Wrist 3.4   17.7   Wrist 5th Digit 41  Motor Left/Right Comparison   Stim Site L Lat (ms) R Lat (ms) L-R Lat (ms) L Amp (mV) R Amp (mV) L-R Amp (%) Site1 Site2 L Vel (m/s) R Vel (m/s) L-R Vel (m/s)  Median Motor (Abd Poll Brev)  33C  Wrist 2.8   9.2   Elbow Wrist 56    Elbow 6.3   4.5         Ulnar Motor (Abd Dig Min)  33.1C  Wrist 2.9   7.1   B Elbow Wrist 66    B Elbow 5.8   8.2   A Elbow B Elbow 83    A Elbow 7.0   6.6            Waveforms:

## 2023-12-25 NOTE — Progress Notes (Unsigned)
 Pain Scale   Average Pain 7 Patient is Right hand Dominate   Send report to PCP, Agarwala and Swartz(pain)        +Driver, -BT, -Dye Allergies.

## 2023-12-26 ENCOUNTER — Encounter: Payer: Self-pay | Admitting: Physical Medicine and Rehabilitation

## 2023-12-26 ENCOUNTER — Other Ambulatory Visit: Payer: Self-pay | Admitting: Family Medicine

## 2023-12-26 DIAGNOSIS — K589 Irritable bowel syndrome without diarrhea: Secondary | ICD-10-CM

## 2023-12-26 NOTE — Progress Notes (Signed)
 LISETT DIRUSSO - 68 y.o. female MRN [id number]  Date of birth: Jan 28, 1956  Office Visit Note: Visit Date: 12/25/2023 PCP: Sandre Kitty, MD Referred by: Adonis Huguenin, NP  Subjective: Chief Complaint  Patient presents with   Left Hand - Numbness, Pain   HPI: EMAAN GARY is a 68 y.o. female who comes in today at the request of Barnie Del, FNP for evaluation and management of chronic, worsening and severe pain, numbness and tingling in the Left upper extremities.  Patient is Right hand dominant.  She reports a several year history of bilateral de Quervain's tenosynovitis that has been amenable to injection over time.  These have been performing by Barnie Del, FN-P in our office.  She has been followed with chronic pain syndrome and the de Quervain's by [name] at [facility] physical medicine debilitation.  Her case is further complicated by history of migraine headaches for which she is followed by Margie Ege at [facility] as well as a history of fibromyalgia and multiple medicine allergies and intolerances.  She began having some paresthesias in the left hand globally and nondermatomal he.  These been going on some time and seem to be worsening to a degree.  She reports a fall in the parking lot at [place] but no other specific trauma.  She has had medication management and time and splinting without much relief along with the injection for de Quervain's.  No prior electrodiagnostic studies.  No history of diabetes.  No thyroid disease.  She reports that she is very homeopathic and does try to avoid medications in general.   I spent more than 30 minutes speaking face-to-face with the patient with 50% of the time in counseling and discussing coordination of care.       Review of Systems  Musculoskeletal:  Positive for back pain, falls, joint pain and neck pain.  Neurological:  Positive for tingling, sensory change, focal weakness and headaches.  All other  systems reviewed and are negative.  Otherwise per HPI.  Assessment & Plan: Visit Diagnoses:    ICD-10-CM   1. Paresthesia of skin  R20.2 NCV with EMG (electromyography)    2. Pain in left wrist  M25.532     3. Pain in left hand  M79.642     4. De Quervain's tenosynovitis, left  M65.4        Plan: Impression: Complicated picture with hand pain and paresthesia somewhat none global nondermatomal in terms of paresthesia and tingling but clearly with some mechanical complaints with the hand with motion and activity.  Responded to tendon injection in the past.  Electrodiagnostic study performed today.  Essentially NORMAL electrodiagnostic study of the left upper limb.  There is no significant electrodiagnostic evidence of nerve entrapment, brachial plexopathy or cervical radiculopathy.    As you know, purely sensory or demyelinating radiculopathies and chemical radiculitis may not be detected with this particular electrodiagnostic study. **This electrodiagnostic study cannot rule out small fiber polyneuropathy and dysesthesias from central pain syndromes such as stroke or central pain sensitization syndromes such as fibromyalgia.  Myotomal referral pain from trigger points is also not excluded.  Recommendations: 1.  Follow-up with referring physician. 2.  Continue current management of symptoms.  She does have follow-up with her hand surgeon, Samuella Cota, MD.  Meds & Orders: No orders of the defined types were placed in this encounter.   Orders Placed This Encounter  Procedures   NCV with EMG (electromyography)  Follow-up: Return for  [employer], FN-P.   Procedures: No procedures performed  EMG & NCV Findings: All nerve conduction studies (as indicated in the following tables) were within normal limits.    All examined muscles (as indicated in the following table) showed no evidence of electrical instability.    Impression: Essentially NORMAL electrodiagnostic study of the  left upper limb.  There is no significant electrodiagnostic evidence of nerve entrapment, brachial plexopathy or cervical radiculopathy.    As you know, purely sensory or demyelinating radiculopathies and chemical radiculitis may not be detected with this particular electrodiagnostic study. **This electrodiagnostic study cannot rule out small fiber polyneuropathy and dysesthesias from central pain syndromes such as stroke or central pain sensitization syndromes such as fibromyalgia.  Myotomal referral pain from trigger points is also not excluded.  Recommendations: 1.  Follow-up with referring physician. 2.  Continue current management of symptoms.  She does have follow-up with her hand surgeon, Samuella Cota, MD.  ___________________________ Naaman Plummer FAAPMR Board Certified, American Board of Physical Medicine and Rehabilitation    Nerve Conduction Studies Anti Sensory Summary Table   Stim Site NR Peak (ms) Norm Peak (ms) P-T Amp (V) Norm P-T Amp Site1 Site2 Delta-P (ms) Dist (cm) Vel (m/s) Norm Vel (m/s)  Left Median Acr Palm Anti Sensory (2nd Digit)  32C  Wrist    3.2 <3.6 18.9 >10 Wrist Palm 1.4 0.0    Palm    1.8 <2.0 23.9         Left Radial Anti Sensory (Base 1st Digit)  32.7C  Wrist    2.0 <3.1 24.4  Wrist Base 1st Digit 2.0 0.0    Left Ulnar Anti Sensory (5th Digit)  32.8C  Wrist    3.4 <3.7 17.7 >15.0 Wrist 5th Digit 3.4 14.0 41 >38   Motor Summary Table   Stim Site NR Onset (ms) Norm Onset (ms) O-P Amp (mV) Norm O-P Amp Site1 Site2 Delta-0 (ms) Dist (cm) Vel (m/s) Norm Vel (m/s)  Left Median Motor (Abd Poll Brev)  33C  Wrist    2.8 <4.2 9.2 >5 Elbow Wrist 3.5 19.5 56 >50  Elbow    6.3  4.5         Left Ulnar Motor (Abd Dig Min)  33.1C  Wrist    2.9 <4.2 7.1 >3 B Elbow Wrist 2.9 19.0 66 >53  B Elbow    5.8  8.2  A Elbow B Elbow 1.2 10.0 83 >53  A Elbow    7.0  6.6          EMG   Side Muscle Nerve Root Ins Act Fibs Psw Amp Dur Poly Recrt Int Dennie Bible Comment   Left Abd Poll Brev Median C8-T1 Nml Nml Nml Nml Nml 0 Nml Nml   Left 1stDorInt Ulnar C8-T1 Nml Nml Nml Nml Nml 0 Nml Nml   Left Deltoid Axillary C5-6 Nml Nml Nml Nml Nml 0 Nml Nml   Left Triceps Radial C6-7-8 Nml Nml Nml Nml Nml 0 Nml Nml   Left Ext Digitorum  Radial (Post Int) C7-8 Nml Nml Nml Nml Nml 0 Nml Nml     Nerve Conduction Studies Anti Sensory Left/Right Comparison   Stim Site L Lat (ms) R Lat (ms) L-R Lat (ms) L Amp (V) R Amp (V) L-R Amp (%) Site1 Site2 L Vel (m/s) R Vel (m/s) L-R Vel (m/s)  Median Acr Palm Anti Sensory (2nd Digit)  32C  Wrist 3.2   18.9   Wrist Palm  Palm 1.8   23.9         Radial Anti Sensory (Base 1st Digit)  32.7C  Wrist 2.0   24.4   Wrist Base 1st Digit     Ulnar Anti Sensory (5th Digit)  32.8C  Wrist 3.4   17.7   Wrist 5th Digit 41     Motor Left/Right Comparison   Stim Site L Lat (ms) R Lat (ms) L-R Lat (ms) L Amp (mV) R Amp (mV) L-R Amp (%) Site1 Site2 L Vel (m/s) R Vel (m/s) L-R Vel (m/s)  Median Motor (Abd Poll Brev)  33C  Wrist 2.8   9.2   Elbow Wrist 56    Elbow 6.3   4.5         Ulnar Motor (Abd Dig Min)  33.1C  Wrist 2.9   7.1   B Elbow Wrist 66    B Elbow 5.8   8.2   A Elbow B Elbow 83    A Elbow 7.0   6.6            Waveforms:            Clinical History: No specialty comments available.   She reports that she has never smoked. She has never used smokeless tobacco. No results for input(s): "HGBA1C", "LABURIC" in the last 8760 hours.  Objective:  VS:  HT:    WT:   BMI:     BP:   HR: bpm  TEMP: ( )  RESP:  Physical Exam Vitals and nursing note reviewed.  Constitutional:      General: She is not in acute distress.    Appearance: Normal appearance. She is well-developed. She is not ill-appearing.  HENT:     Head: Normocephalic and atraumatic.     Nose:     Comments: Wearing face mask Eyes:     Conjunctiva/sclera: Conjunctivae normal.     Pupils: Pupils are equal, round, and reactive to light.   Cardiovascular:     Rate and Rhythm: Normal rate.     Pulses: Normal pulses.  Pulmonary:     Effort: Pulmonary effort is normal.  Musculoskeletal:        General: No swelling, tenderness or deformity.     Right lower leg: No edema.     Left lower leg: No edema.     Comments: Inspection reveals no atrophy of the bilateral APB or FDI or hand intrinsics. There is no swelling, color changes, allodynia or dystrophic changes. There is 5 out of 5 strength in the bilateral wrist extension, finger abduction and long finger flexion. There is intact sensation to light touch in all dermatomal and peripheral nerve distributions.  Clinically positive Finkelstein's.  Very tender to palpation.  There is a negative Froment's test bilaterally. There is a negative Tinel's test at the bilateral wrist and elbow. There is a negative Phalen's test bilaterally. There is a negative Hoffmann's test bilaterally.  Skin:    General: Skin is warm and dry.     Findings: No erythema or rash.  Neurological:     General: No focal deficit present.     Mental Status: She is alert and oriented to person, place, and time.     Cranial Nerves: No cranial nerve deficit.     Sensory: No sensory deficit.     Motor: No weakness or abnormal muscle tone.     Coordination: Coordination normal.     Gait: Gait abnormal.     Comments: Using rollator.  Psychiatric:        Mood and Affect: Mood normal.        Behavior: Behavior normal.     Ortho Exam  Imaging: No results found.  Past Medical/Family/Surgical/Social History: Medications & Allergies reviewed per EMR, new medications updated. 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   S/P repair of ventral hernia  04/22/2021   PAT (paroxysmal atrial tachycardia) (HCC)    Renal cell cancer (HCC) 11/21/2018   Family history of colon cancer in father 01/07/2018   History of adenomatous polyp of colon 01/07/2018   Internal hemorrhoids 01/07/2018   Irritable bowel syndrome with diarrhea 01/07/2018   Vitamin D deficiency 08/28/2017   Primary hypertension 08/28/2017   Herpes simplex- oral lesions 08/10/2017   Bipolar 1 disorder, mixed, moderate (HCC) 08/10/2017   History of anaphylaxis 08/10/2017   Postmenopausal syndrome 08/10/2017   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   History of kidney cancer 02/22/2016   De Quervain's tenosynovitis, right 04/24/2014   Enlarged lymph node 02/12/2014   BPPV (benign paroxysmal positional vertigo) 12/05/2013   Cervical spondylosis without myelopathy 09/17/2013   Carpal tunnel syndrome 01/01/2013   Wrist tendonitis 01/10/2012   Fibromyalgia/myofascial pain syndrome 12/06/2011   Lumbar spondylosis 12/06/2011   Lumbar degenerative disc disease 12/06/2011   Depression with anxiety 12/06/2011   Migraine aura, persistent, intractable 12/06/2011   Past Medical History:  Diagnosis Date   Anxiety    Arthritis    Asthma    Bipolar 2 disorder (HCC)    pt stated, "I have Bipolar 2 and it is remission"   Bipolar affective (HCC)    CAD (coronary artery disease), native artery transplanted heart    Nonobstructive by coronary CTA 08/2021 with less than 25% stenosis in the LAD, RCA and left circumflex.   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   Full dentures    Gout    Headache(784.0)    migraines   Herpesviral infection    Hypertension    IBS (irritable bowel syndrome)    with diarrhea    Lymphadenopathy    Myalgia and myositis, unspecified    Nephrolithiasis 02/11/2013   PAT (paroxysmal atrial tachycardia) (HCC)    asymptomatic 10 beat run on event monitor 09/2020   PFO (patent foramen ovale)    Small PFO noted on coronary CTA   Pneumonia    PONV (postoperative nausea and vomiting)    PTSD (post-traumatic stress disorder)    Renal calculi    Restless legs syndrome (RLS)    Thoracic radiculopathy    Vitamin D deficiency    Wears glasses    Family History  Problem Relation Age of Onset   Cancer Mother        stomach   Migraines Mother    Arthritis Mother    Emphysema Mother    Heart disease Father    Hypertension Father    Cancer Father  colon   Dementia Father    Hypertension Brother    Migraines Brother    Hypertension Brother    Migraines Brother    Alcohol abuse Brother    Bipolar disorder Brother    Migraines Daughter    Arthritis Maternal Grandmother    Cancer Maternal Grandmother        stomach   Diabetes Maternal Grandmother    Stroke Maternal Grandmother    Cancer Maternal Aunt        lung   Emphysema Maternal Aunt    Cancer Paternal Uncle        colon   Hypertension Maternal Aunt    Heart disease Maternal Aunt    Cancer Paternal Uncle        lung   Past Surgical History:  Procedure Laterality Date    kidney stones     ABDOMINAL ADHESION SURGERY     ABDOMINAL HYSTERECTOMY  1995   partial- hemmoraged after surgery-stitch came loose   APPENDECTOMY  1993   hemmoraged after surgery- stitch came loose   CERVICAL DISC SURGERY  06/04/2001   with fusion   CHOLECYSTECTOMY  1985   colonoscopy     COLONOSCOPY     CYSTOSCOPY     FOOT SURGERY     lt   INCISIONAL HERNIA REPAIR N/A 04/22/2021   Procedure: LAPAROSCOPIC INCISIONAL HERNIA REPAIR WITH MESH;  Surgeon: Berna Bue, MD;  Location: [facility];  Service: General;  Laterality: N/A;   jj stent  07/2002   with ureteroscopy, cystoscopy and then removal of stent in office    LAPAROSCOPIC LYSIS OF ADHESIONS N/A 04/22/2021   Procedure: LYSIS OF ADHESIONS;  Surgeon: Berna Bue, MD;  Location: [facility];  Service: General;  Laterality: N/A;   LITHOTRIPSY     LYMPH NODE BIOPSY Right 03/06/2014   Procedure: right groin LYMPH NODE BIOPSY;  Surgeon: Robyne Askew, MD;  Location: [facility];  Service: General;  Laterality: Right;   LYMPHADENECTOMY N/A 12/29/2015   Procedure: RETROPERITONEAL LYMPHADENECTOMY;  Surgeon: Sebastian Ache, MD;  Location: [facility];  Service: Urology;  Laterality: N/A;   NECK SURGERY  2002   ROBOT ASSISTED LAPAROSCOPIC NEPHRECTOMY Left 12/29/2015   Procedure: XI ROBOTIC ASSISTED LEFT LAPAROSCOPIC NEPHRECTOMY;  Surgeon: Sebastian Ache, MD;  Location: [facility];  Service: Urology;  Laterality: Left;   TONSILLECTOMY  1976   Social History   Occupational History   Occupation: HOUSEWIFE    Associate Professor: UNEMPLOYED  Tobacco Use   Smoking status: Never   Smokeless tobacco: Never  Vaping Use   Vaping status: Never Used  Substance and Sexual Activity   Alcohol use: No   Drug use: No   Sexual activity: Not on file

## 2024-01-01 ENCOUNTER — Other Ambulatory Visit: Payer: Self-pay | Admitting: Family Medicine

## 2024-01-01 DIAGNOSIS — K589 Irritable bowel syndrome without diarrhea: Secondary | ICD-10-CM

## 2024-01-08 ENCOUNTER — Ambulatory Visit: Payer: Medicare Other | Admitting: Cardiology

## 2024-01-09 ENCOUNTER — Ambulatory Visit: Payer: Self-pay

## 2024-01-09 NOTE — Patient Instructions (Signed)
 Visit Information  Thank you for taking time to visit with me today. Please don't hesitate to contact me if I can be of assistance to you before our next scheduled appointment.  Your next care management appointment is by telephone on 02/08/24 at 245 pm  Telephone follow-up in 1 month  Please call the care guide team at [phone] if you need to cancel, schedule, or reschedule an appointment.   Please call [phone]-TALK (toll free, 24 hour hotline) if you are experiencing a Mental Health or Behavioral Health Crisis or need someone to talk to.  Juanell Fairly RN, BSN, [facility] [place]  [facility], [facility]  Care Coordinator Phone: [phone]

## 2024-01-09 NOTE — Patient Outreach (Signed)
 Complex Care Management   Visit Note  01/09/2024  Name:  Patty Salas MRN: [id number] DOB: October 11, 1955  Situation: Referral received for Complex Care Management related to  Migraine/IBS  I obtained verbal consent from  the patient.  Visit completed with the patient  on the phone  Background:   Past Medical History:  Diagnosis Date   Anxiety    Arthritis    Asthma    Bipolar 2 disorder (HCC)    pt stated, "I have Bipolar 2 and it is remission"   Bipolar affective (HCC)    CAD (coronary artery disease), native artery transplanted heart    Nonobstructive by coronary CTA 08/2021 with less than 25% stenosis in the LAD, RCA and left circumflex.   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   Full dentures    Gout    Headache(784.0)    migraines   Herpesviral infection    Hypertension    IBS (irritable bowel syndrome)    with diarrhea   Lymphadenopathy    Myalgia and myositis, unspecified    Nephrolithiasis 02/11/2013   PAT (paroxysmal atrial tachycardia) (HCC)    asymptomatic 10 beat run on event monitor 09/2020   PFO (patent foramen ovale)    Small PFO noted on coronary CTA   Pneumonia    PONV (postoperative nausea and vomiting)    PTSD (post-traumatic stress disorder)    Renal calculi    Restless legs syndrome (RLS)    Thoracic radiculopathy    Vitamin D deficiency    Wears glasses     Assessment: Patient Reported Symptoms:  Cognitive Able to follow simple commands, Alert and oriented to person, place, and time  Neurological      HEENT      Cardiovascular Not assessed    Respiratory Not assesed    Endocrine Not assessed    Gastrointestinal Irritable bowel syndrome    Genitourinary Not assessed    Integumentary Not  assessed    Musculoskeletal Not assessed    Psychosocial Not assessed     There were no vitals filed for this visit.  Medications Reviewed Today     Reviewed by Juanell Fairly, RN (Registered Nurse) on 01/09/24 at 1340  Med List Status: <None>   Medication Order Taking? Sig Documenting Provider Last Dose Status Informant  acetaminophen (TYLENOL) 500 MG tablet [id number]  Take 500 mg by mouth every 6 (six) hours as needed for moderate pain. [provider]  Active Self  almotriptan (AXERT) 12.5 MG tablet [id number]  Take 1 tablet (12.5 mg total) by mouth as needed for migraine. may repeat in 2 hours if needed Jones Bales, NP  Active   bisacodyl (DULCOLAX) 5 MG EC tablet [id number]  Take 10 mg by mouth daily as needed for mild constipation.  [provider]  Active Self  cyclobenzaprine (FLEXERIL) 10 MG tablet [id number]  Take 1 tablet (10 mg total) by mouth 3 (three) times daily. Jones Bales, NP  Active   docusate sodium (COLACE) 100 MG capsule [id number]  Take 100 mg by mouth daily as needed for mild constipation.  [provider]  Active Self  EPINEPHrine 0.3 mg/0.3 mL IJ SOAJ injection [id number]  Inject 0.3 mg into the muscle as needed for anaphylaxis. Sandre Kitty, MD  Active   furosemide (LASIX) 20 MG tablet [id number]  Take 1 tablet (20 mg total) by mouth daily as needed for edema. Quintella Reichert, MD  Active   gabapentin (NEURONTIN) 300 MG capsule [id number]  Take 2 capsules by mouth at bedtime. [provider]  Active   HYDROcodone-acetaminophen (NORCO/VICODIN) 5-325 MG tablet [id number]  Take 1 tablet by mouth every 8 (eight) hours as needed for moderate pain (pain score 4-6). Do Not Fill Before 01/14/2024 Jones Bales, NP  Active   hyoscyamine (ANASPAZ) 0.125 MG TBDP disintergrating tablet [id number]  DISSOLVE 1 TABLET(0.125 MG) UNDER THE TONGUE EVERY 4 HOURS FOR UP TO 10 DAYS AS NEEDED Sandre Kitty, MD  Active   Loperamide HCl (IMODIUM A-D  PO) [id number]   [provider]  Active   LORazepam (ATIVAN) 1 MG tablet [id number]  Take 1 tablet by mouth 3 (three) times daily. [provider]  Active   promethazine (PHENERGAN) 25 MG tablet [id number]  TAKE 1/2 TABLET BY MOUTH EVERY 6 HOURS AS NEEDED FOR NAUSEA OR VOMITING Sandre Kitty, MD  Active   Trospium Chloride 60 MG CP24 [id number]  Take 1 capsule by mouth every morning.  Jerilee Field, MD  Active Self  VENTOLIN HFA [phone] Base) MCG/ACT inhaler [id number]  INHALE 2 PUFFS INTO THE LUNGS EVERY 6 HOURS AS NEEDED FOR WHEEZE Carlean Jews, NP  Active             Recommendation:   PCP Follow-up Specialty provider follow-up 01/10/23  Follow Up Plan:   Telephone follow-up in 1 month  Juanell Fairly RN, BSN, [facility]  [facility], [facility]  Care Coordinator Phone: [phone]

## 2024-01-10 ENCOUNTER — Other Ambulatory Visit: Payer: Self-pay

## 2024-01-10 NOTE — Patient Outreach (Signed)
 Complex Care Management   Visit Note  01/10/2024  Name:  Patty Salas MRN: [id number] DOB: 26-Dec-1955  Situation: Referral received for Complex Care Management related to SDOH Barriers:  Transportation I obtained verbal consent from Patient.  Visit completed with Patient  on the phone  Background:   Past Medical History:  Diagnosis Date   Anxiety    Arthritis    Asthma    Bipolar 2 disorder (HCC)    pt stated, "I have Bipolar 2 and it is remission"   Bipolar affective (HCC)    CAD (coronary artery disease), native artery transplanted heart    Nonobstructive by coronary CTA 08/2021 with less than 25% stenosis in the LAD, RCA and left circumflex.   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   Full dentures    Gout    Headache(784.0)    migraines   Herpesviral infection    Hypertension    IBS (irritable bowel syndrome)    with diarrhea   Lymphadenopathy    Myalgia and myositis, unspecified    Nephrolithiasis 02/11/2013   PAT (paroxysmal atrial tachycardia) (HCC)    asymptomatic 10 beat run on event monitor 09/2020   PFO (patent foramen ovale)    Small PFO noted on coronary CTA   Pneumonia    PONV (postoperative nausea and vomiting)    PTSD (post-traumatic stress disorder)    Renal calculi    Restless legs syndrome (RLS)    Thoracic radiculopathy    Vitamin D deficiency    Wears glasses     Assessment: Patient Reported Symptoms:  Cognitive    Neurological      HEENT      Cardiovascular      Respiratory      Endocrine      Gastrointestinal      Genitourinary      Integumentary      Musculoskeletal      Psychosocial       There were no vitals filed for this visit.  Medications Reviewed Today   Medications were  not reviewed in this encounter     Recommendation:   Patient will contact senior wheels for any transportation neeeds. Patient will contact BSW for any questions and future needs.  Follow Up Plan:   Patient has met all care management goals. Care Management case will be closed. Patient has been provided contact information should new needs arise.  Gus Puma, Kenard Gower, MHA [place]  [facility] Social Worker, Population Health [phone]

## 2024-01-10 NOTE — Patient Instructions (Signed)
 Visit Information  Thank you for taking time to visit with me today. Please don't hesitate to contact me if I can be of assistance to you before our next scheduled appointment.  Your next care management appointment is no further scheduled appointments.    Patient has met all care management goals. Care Management case will be closed. Patient has been provided contact information should new needs arise.   Please call the care guide team at [phone] if you need to cancel, schedule, or reschedule an appointment.   Please call the Suicide and Crisis Lifeline: 988 call the [place] National Suicide Prevention Lifeline: (406)456-1617 or TTY: (804) 686-2086 TTY 248-532-3509) to talk to a trained counselor go to [facility] [address] [place] [phone]) call 911 if you are experiencing a Mental Health or Behavioral Health Crisis or need someone to talk to.  Gus Puma, Kenard Gower, MHA [place]  [facility] Social Worker, Population Health [phone]

## 2024-01-14 ENCOUNTER — Ambulatory Visit: Admitting: Orthopedic Surgery

## 2024-01-16 ENCOUNTER — Telehealth: Admitting: Family Medicine

## 2024-01-16 ENCOUNTER — Ambulatory Visit: Admitting: Family Medicine

## 2024-01-16 ENCOUNTER — Encounter: Payer: Self-pay | Admitting: Family Medicine

## 2024-01-16 ENCOUNTER — Ambulatory Visit: Payer: Self-pay

## 2024-01-16 ENCOUNTER — Telehealth: Payer: Self-pay | Admitting: Registered Nurse

## 2024-01-16 VITALS — BP 131/85 | HR 109 | Ht 65.5 in | Wt 151.3 lb

## 2024-01-16 DIAGNOSIS — K580 Irritable bowel syndrome with diarrhea: Secondary | ICD-10-CM

## 2024-01-16 DIAGNOSIS — K589 Irritable bowel syndrome without diarrhea: Secondary | ICD-10-CM

## 2024-01-16 MED ORDER — PROMETHAZINE HCL 25 MG PO TABS: ORAL_TABLET | ORAL | 0 refills | Status: DC

## 2024-01-16 MED ORDER — HYOSCYAMINE SULFATE 0.125 MG PO TBDP: ORAL_TABLET | ORAL | 0 refills | Status: DC

## 2024-01-16 NOTE — Telephone Encounter (Signed)
 Chief Complaint: Abdominal Pain  Symptoms: Abdominal pain 8/10, diarrhea, vomiting, Hx of irritable bowel syndrome  Frequency: Comes and goes  Pertinent Negatives: Patient denies chest pain,  Disposition: [] ED /[] Urgent Care (no appt availability in office) / [x] Appointment(In office/virtual)/ []  [facility]/ [] Home Care/ [] Refused Recommended Disposition /[] [facility]/ []  Follow-up with PCP Additional Notes: Patient states she started having sudden abdominal pain with an onset of last night. Patient states she started vomiting and experiencing diarrhea this morning. Patient wanted to know if she could take imodium AD now since she already took Hyoscyamine last night around 9pm. Patient also stated she has 6 tablets of promethazine 25 mg left. Care advice was given and advised patient to keep her scheduled appointment with PCP today at 1110.  Copied From CRM [id number]. Reason for Triage: Pt began to have stomach pains on last night around 9PM, and woke up with diarrhea around 6AM this morning, and vomitting. Converted her in office visit with Dr. Corky Downs today at 11:10 AM to a virtual appt. Pink word sent to nurse triage.   Reason for Disposition  [1] Vomiting AND [2] abdomen looks much more swollen than usual  Answer Assessment - Initial Assessment Questions 1. LOCATION: "Where does it hurt?"      All over  2. RADIATION: "Does the pain shoot anywhere else?" (e.g., chest, back)     No 3. ONSET: "When did the pain begin?" (e.g., minutes, hours or days ago)      Yesterday  4. SUDDEN: "Gradual or sudden onset?"     Sudden 5. PATTERN "Does the pain come and go, or is it constant?"    - If it comes and goes: "How long does it last?" "Do you have pain now?"     (Note: Comes and goes means the pain is intermittent. It goes away completely between bouts.)    - If constant: "Is it getting better, staying the same, or getting worse?"      (Note: Constant means the pain never goes  away completely; most serious pain is constant and gets worse.)      Come and go  6. SEVERITY: "How bad is the pain?"  (e.g., Scale 1-10; mild, moderate, or severe)    - MILD (1-3): Doesn't interfere with normal activities, abdomen soft and not tender to touch.     - MODERATE (4-7): Interferes with normal activities or awakens from sleep, abdomen tender to touch.     - SEVERE (8-10): Excruciating pain, doubled over, unable to do any normal activities.       8/10 7. RECURRENT SYMPTOM: "Have you ever had this type of stomach pain before?" If Yes, ask: "When was the last time?" and "What happened that time?"      No like this  8. CAUSE: "What do you think is causing the stomach pain?"     I'm not sure  9. RELIEVING/AGGRAVATING FACTORS: "What makes it better or worse?" (e.g., antacids, bending or twisting motion, bowel movement)     Medication is making it feel better  10. OTHER SYMPTOMS: "Do you have any other symptoms?" (e.g., back pain, diarrhea, fever, urination pain, vomiting)       Diarrhea, vomiting, headache  Protocols used: Abdominal Pain - Female-A-AH

## 2024-01-16 NOTE — Telephone Encounter (Signed)
(  Key: [id number]) PA Case ID #: [id number]  Submitted for Cyclobenzaprine

## 2024-01-16 NOTE — Telephone Encounter (Signed)
 Patient needs prior auth on Cyclobenzaprine.  If you have any questions please call patient.

## 2024-01-16 NOTE — Progress Notes (Signed)
   Established Patient Office Visit  Subjective   Patient ID: Patty Salas, female    DOB: [birth year]  Age: 68 y.o. MRN: [id number]  No chief complaint on file.   HPI Patient location: Home Provider location: [facility] primary care Method of visit: Video visit was attempted.  There was an issue with connection.  Was switched to audio. Duration: 30 minutes  Patient originally scheduled for in person visit today but last night around 8 PM she had significant abdominal pain with nausea and dry heaving.  Pain was in the "middle" of her stomach and did not radiate.  She took her hyoscyamine  and Norco.  Took hyoscyamine  again at 4 AM and switched her appointment to virtual because she was concerned about driving while using these medications in her system.  Patient did have diarrhea this morning abdominal pain is much better.  Patient requesting refill of her hyoscyamine  and promethazine .  Patient reiterates that she does not want it to be prescribed early and is very conscious about not taking more than prescribed.  Patient does note that after her antibiotic therapy February she did feel much better.  Patient continues to have issues with migraines.  Patient continues to go to talk therapy with her therapist Larose Plump.  Continues to use behavioral techniques to manage her pain in addition to her medications.    The 10-year ASCVD risk score (Arnett DK, et al., 2019) is: 13.1%  Health Maintenance Due  Topic Date Due   Zoster Vaccines- Shingrix (1 of 2) 12/29/1973   MAMMOGRAM  05/20/2009   DTaP/Tdap/Td (2 - Td or Tdap) 10/12/2016   COVID-19 Vaccine (5 - 2024-25 season) 06/03/2023      Objective:     BP 131/85   Pulse (!) 109   Ht 5' 5.5" (1.664 m)   Wt 151 lb 4.8 oz (68.6 kg)   BMI 24.79 kg/m    Physical Exam General: Alert, oriented Pulmonary: Speaking full sentences without difficulty.  No respiratory distress   No results found for any visits on 01/16/24.       Assessment & Plan:   Irritable bowel syndrome with diarrhea Assessment & Plan: Had an episode of abdominal pain with nausea and dry heaving last night.  Better today.  Uncertain if it is related to her IBS or a viral infection.  Sending in refills of her hyoscyamine  and promethazine    Irritable bowel syndrome, unspecified type -     Hyoscyamine  Sulfate; DISSOLVE 1 TABLET(0.125 MG) UNDER THE TONGUE EVERY 4 HOURS FOR UP TO 10 DAYS AS NEEDED  Dispense: 30 tablet; Refill: 0  Other orders -     Promethazine  HCl; TAKE 1/2 TABLET BY MOUTH EVERY 6 HOURS AS NEEDED FOR NAUSEA OR VOMITING  Dispense: 15 tablet; Refill: 0     No follow-ups on file.    Laneta Pintos, MD

## 2024-01-17 NOTE — Telephone Encounter (Signed)
 RX approved. Approval faxed to the pharmacy (letter to be scanned).

## 2024-01-21 NOTE — Telephone Encounter (Signed)
 Approved 10/03/23-04/15/24

## 2024-01-23 ENCOUNTER — Ambulatory Visit: Payer: Self-pay

## 2024-01-23 ENCOUNTER — Encounter: Payer: Medicare Other | Admitting: Physical Medicine & Rehabilitation

## 2024-01-23 NOTE — Telephone Encounter (Signed)
 Please let the patient know that I called the pharmacy and changed the dates so that they will fill the prescription today and she can pick it up when it is convenient for her.

## 2024-01-23 NOTE — Telephone Encounter (Signed)
  Chief Complaint: medication question  Additional Notes:  Patient reports she wants her Phenergan  and Anaspax refilled sooner. Patient states her and Dr Arabella Beach agreed initially to refill on 4/25 but she does not want to wait due to already being out in the area tomorrow for appts. Patient reports abdominal pain has not worsened since she was last seen for this, but would like the medication refilled today if possible. Advised patient would forward request to appropriate person for follow-up. Patient advised to call back with worsening symptoms or further questions. Patient verbalized understanding.   Copied from CRM [id number]. Topic: Clinical - Red Word Triage >> Jan 23, 2024 10:25 AM Ivette P wrote: [facility] that prompted transfer to Nurse Triage: In a lot of pain need medication Reason for Disposition  [1] Follow-up call to recent contact AND [2] information only call, no triage required  Answer Assessment - Initial Assessment Questions 1. REASON FOR CALL or QUESTION: "What is your reason for calling today?" or "How can I best help you?" or "What question do you have that I can help answer?"     Patient reports she wants her Phenergan  and Anaspax refilled sooner. Patient states her and Dr Arabella Beach agreed initially to refill on 4/25 but she does not want to wait due to already being out in the area tomorrow for appts. Patient reports pain has not worsened since she was last seen for this, but would like the medication refilled today. Advised patient would forward request to appropriate person for follow-up. Patient advised to call back with worsening symptoms or further questions. Patient verbalized understanding.  Protocols used: Information Only Call - No Triage-A-AH

## 2024-01-23 NOTE — Telephone Encounter (Signed)
 Patient was advised of her appt for further visit in office and also of her Rx's

## 2024-01-24 NOTE — Assessment & Plan Note (Signed)
 Had an episode of abdominal pain with nausea and dry heaving last night.  Better today.  Uncertain if it is related to her IBS or a viral infection.  Sending in refills of her hyoscyamine  and promethazine 

## 2024-02-08 ENCOUNTER — Ambulatory Visit: Payer: Self-pay

## 2024-02-08 ENCOUNTER — Other Ambulatory Visit: Payer: Self-pay
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
## 2024-02-11 ENCOUNTER — Ambulatory Visit: Payer: Self-pay

## 2024-02-11 ENCOUNTER — Other Ambulatory Visit: Payer: Self-pay | Admitting: Family Medicine

## 2024-02-11 NOTE — Telephone Encounter (Signed)
 Copied From CRM [id number]. Reason for Triage: The patient shares that they have had a significant fall on 02/06/24 and would like to provide an update to past discussions about their fall with a member of clinical staff when possible to follow up on their red word call from 02/08/24  Please contact when possible   0837- patient states that she spoke with Thurston Flow this morning at 0802. No needs at this time.

## 2024-02-12 ENCOUNTER — Ambulatory Visit: Payer: Self-pay | Admitting: *Deleted

## 2024-02-12 NOTE — Telephone Encounter (Signed)
 Difficult triage- patient has multiple complaints Chief Complaint: diarrhea, vomiting Symptoms: severe diarrhea, vomiting Frequency: symptoms started yesterday Pertinent Negatives: Patient denies fever, headache, numbness Disposition: [] ED /[] Urgent Care (no appt availability in office) / [] Appointment(In office/virtual)/ []  [facility]/ [] Home Care/ [x] Refused Recommended Disposition /[] [facility]/ []  Follow-up with PCP Additional Notes: No open appointment-  UC advised - but patient declines, Patient is going to start imodium (she will hold hyoscyamine ) Patient will call back if she doesn't get better.  Patient is working very hard to decrease all of her controled substances- Norco is next on list. Patient has been notified her phenergan  is at pharmacy.    Copied from CRM [id number]. Topic: Clinical - Red Word Triage >> Feb 12, 2024  9:03 AM Lorenz Romano B wrote: [facility] that prompted transfer to Nurse Triage: patient has diarrhea and chills Reason for Disposition  [1] SEVERE diarrhea (e.g., 7 or more times / day more than normal) AND [2] age > 60 years  Answer Assessment - Initial Assessment Questions 1. DIARRHEA SEVERITY: "How bad is the diarrhea?" "How many more stools have you had in the past 24 hours than normal?"    - NO DIARRHEA (SCALE 0)   - MILD (SCALE 1-3): Few loose or mushy BMs; increase of 1-3 stools over normal daily number of stools; mild increase in ostomy output.   -  MODERATE (SCALE 4-7): Increase of 4-6 stools daily over normal; moderate increase in ostomy output.   -  SEVERE (SCALE 8-10; OR "WORST POSSIBLE"): Increase of 7 or more stools daily over normal; moderate increase in ostomy output; incontinence.     severe 2. ONSET: "When did the diarrhea begin?"      yesterday 3. BM CONSISTENCY: "How loose or watery is the diarrhea?"      Started soft- now watery 4. VOMITING: "Are you also vomiting?" If Yes, ask: "How many times in the past 24 hours?"       Yes-not as bad as diarrhea  5. ABDOMEN PAIN: "Are you having any abdomen pain?" If Yes, ask: "What does it feel like?" (e.g., crampy, dull, intermittent, constant)      Yes- all day yesterday before diarrhea started 6. ABDOMEN PAIN SEVERITY: If present, ask: "How bad is the pain?"  (e.g., Scale 1-10; mild, moderate, or severe)   - MILD (1-3): doesn't interfere with normal activities, abdomen soft and not tender to touch    - MODERATE (4-7): interferes with normal activities or awakens from sleep, abdomen tender to touch    - SEVERE (8-10): excruciating pain, doubled over, unable to do any normal activities       Better than yesterday 7. ORAL INTAKE: If vomiting, "Have you been able to drink liquids?" "How much liquids have you had in the past 24 hours?"     Bottled water - soup 8. HYDRATION: "Any signs of dehydration?" (e.g., dry mouth [not just dry lips], too weak to stand, dizziness, new weight loss) "When did you last urinate?"     Patient states she is not dehydrated 9. EXPOSURE: "Have you traveled to a foreign country recently?" "Have you been exposed to anyone with diarrhea?" "Could you have eaten any food that was spoiled?"     Unsure- husband is having same symptoms- did not eat same food  Protocols used: [facility]

## 2024-02-13 ENCOUNTER — Encounter: Admitting: Registered Nurse

## 2024-02-14 ENCOUNTER — Other Ambulatory Visit: Payer: Self-pay | Admitting: Cardiology

## 2024-02-14 ENCOUNTER — Ambulatory Visit: Admitting: Registered Nurse

## 2024-02-18 ENCOUNTER — Ambulatory Visit: Admitting: Registered Nurse

## 2024-02-18 ENCOUNTER — Encounter: Admitting: Registered Nurse

## 2024-02-19 ENCOUNTER — Encounter: Payer: Self-pay | Admitting: Registered Nurse

## 2024-02-19 ENCOUNTER — Encounter: Attending: Physical Medicine & Rehabilitation | Admitting: Registered Nurse

## 2024-02-19 VITALS — BP 140/70 | HR 100 | Ht 65.5 in | Wt 156.4 lb

## 2024-02-19 DIAGNOSIS — M797 Fibromyalgia: Secondary | ICD-10-CM

## 2024-02-19 DIAGNOSIS — M5412 Radiculopathy, cervical region: Secondary | ICD-10-CM

## 2024-02-19 DIAGNOSIS — M25562 Pain in left knee: Secondary | ICD-10-CM | POA: Diagnosis present

## 2024-02-19 DIAGNOSIS — G894 Chronic pain syndrome: Secondary | ICD-10-CM | POA: Diagnosis not present

## 2024-02-19 DIAGNOSIS — Z79891 Long term (current) use of opiate analgesic: Secondary | ICD-10-CM

## 2024-02-19 DIAGNOSIS — M7062 Trochanteric bursitis, left hip: Secondary | ICD-10-CM | POA: Insufficient documentation

## 2024-02-19 DIAGNOSIS — M25561 Pain in right knee: Secondary | ICD-10-CM | POA: Insufficient documentation

## 2024-02-19 DIAGNOSIS — M7061 Trochanteric bursitis, right hip: Secondary | ICD-10-CM | POA: Diagnosis present

## 2024-02-19 DIAGNOSIS — M5416 Radiculopathy, lumbar region: Secondary | ICD-10-CM

## 2024-02-19 DIAGNOSIS — M546 Pain in thoracic spine: Secondary | ICD-10-CM | POA: Diagnosis present

## 2024-02-19 DIAGNOSIS — M47812 Spondylosis without myelopathy or radiculopathy, cervical region: Secondary | ICD-10-CM

## 2024-02-19 DIAGNOSIS — G8929 Other chronic pain: Secondary | ICD-10-CM

## 2024-02-19 DIAGNOSIS — M542 Cervicalgia: Secondary | ICD-10-CM

## 2024-02-19 DIAGNOSIS — Z5181 Encounter for therapeutic drug level monitoring: Secondary | ICD-10-CM

## 2024-02-19 MED ORDER — HYDROCODONE-ACETAMINOPHEN 5-325 MG PO TABS: 1.0000 | ORAL_TABLET | Freq: Three times a day (TID) | ORAL | 0 refills | Status: DC | PRN

## 2024-02-19 NOTE — Progress Notes (Signed)
 Subjective:    Patient ID: Patty Salas, female    DOB: [birth year], 68 y.o.   MRN: [id number]  HPI: Patty Salas is a 68 y.o. female who returns for follow up appointment for chronic pain and medication refill. She states her pain is located in her neck radiating into her bilateral shoulders, mid- lower back pain radiating into her bilateral lower extremities, bilateral hip pain and bilateral knee pain. She rates her pain 7. Her current exercise regime is walking  with walker and performing stretching exercises.  Patty Salas reports she had a fall on 02/06/2024, she states she was changing her bedding, she lost her balance and landed on her right side. She was able to get up, she reports she informed her PCP. She has resolving ecchymosis of let lower extremity.   Patty Salas Morphine equivalent is 15.38 MME.  She  is also prescribed Lorazepam  by Dr. Kieran Pellet .We have discussed the black box warning of using opioids and benzodiazepines. I highlighted the dangers of using these drugs together and discussed the adverse events including respiratory suppression, overdose, cognitive impairment and importance of compliance with current regimen. We will continue to monitor and adjust as indicated.  she is being closely monitored and under the care of her psychiatrist.    UDS ordered today.     Pain Inventory Average Pain 7 Pain Right Now 9 My pain is constant, sharp, burning, dull, stabbing, tingling, and aching  In the last 24 hours, has pain interfered with the following? General activity 8 Relation with others 7 Enjoyment of life 9 What TIME of day is your pain at its worst? morning , daytime, evening, and night Sleep (in general) Fair  Pain is worse with: walking, bending, sitting, inactivity, standing, and some activites Pain improves with: rest, heat/ice, medication, and injections Relief from Meds: 6  Family History  Problem Relation Age of Onset   Cancer Mother        stomach    Migraines Mother    Arthritis Mother    Emphysema Mother    Heart disease Father    Hypertension Father    Cancer Father        colon   Dementia Father    Hypertension Brother    Migraines Brother    Hypertension Brother    Migraines Brother    Alcohol  abuse Brother    Bipolar disorder Brother    Migraines Daughter    Arthritis Maternal Grandmother    Cancer Maternal Grandmother        stomach   Diabetes Maternal Grandmother    Stroke Maternal Grandmother    Cancer Maternal Aunt        lung   Emphysema Maternal Aunt    Cancer Paternal Uncle        colon   Hypertension Maternal Aunt    Heart disease Maternal Aunt    Cancer Paternal Uncle        lung   Social History   Socioeconomic History   Marital status: Married    Spouse name: Not on file   Number of children: 1   Years of education: COLLEGE1   Highest education level: Some college, no degree  Occupational History   Occupation: HOUSEWIFE    Associate Professor: UNEMPLOYED  Tobacco Use   Smoking status: Never   Smokeless tobacco: Never  Vaping Use   Vaping status: Never Used  Substance and Sexual Activity   Alcohol  use: No   Drug use: No  Sexual activity: Not on file  Other Topics Concern   Not on file  Social History Narrative   Patient is right handed.   Patient drinks caffeine occasionally.   Social Drivers of Corporate investment banker Strain: Low Risk  (11/19/2023)   Overall Financial Resource Strain (CARDIA)    Difficulty of Paying Living Expenses: Not very hard  Food Insecurity: No Food Insecurity (12/20/2023)   Hunger Vital Sign    Worried About Running Out of Food in the Last Year: Never true    Ran Out of Food in the Last Year: Never true  Transportation Needs: Unmet Transportation Needs (12/20/2023)   PRAPARE - Transportation    Lack of Transportation (Medical): Yes    Lack of Transportation (Non-Medical): Yes  Physical Activity: Inactive (11/19/2023)   Exercise Vital Sign    Days of Exercise per  Week: 0 days    Minutes of Exercise per Session: 0 min  Stress: No Stress Concern Present (11/19/2023)   [employer] of Occupational Health - Occupational Stress Questionnaire    Feeling of Stress : Only a little  Social Connections: Moderately Integrated (11/19/2023)   Social Connection and Isolation Panel [NHANES]    Frequency of Communication with Friends and Family: More than three times a week    Frequency of Social Gatherings with Friends and Family: Once a week    Attends Religious Services: More than 4 times per year    Active Member of [employer] or Organizations: No    Attends Engineer, structural: Never    Marital Status: Married   Past Surgical History:  Procedure Laterality Date    kidney stones     ABDOMINAL ADHESION SURGERY     ABDOMINAL HYSTERECTOMY  1995   partial- hemmoraged after surgery-stitch came loose   APPENDECTOMY  1993   hemmoraged after surgery- stitch came loose   CERVICAL DISC SURGERY  06/04/2001   with fusion   CHOLECYSTECTOMY  1985   colonoscopy     COLONOSCOPY     CYSTOSCOPY     FOOT SURGERY     lt   INCISIONAL HERNIA REPAIR N/A 04/22/2021   Procedure: LAPAROSCOPIC INCISIONAL HERNIA REPAIR WITH MESH;  Surgeon: Adalberto Acton, MD;  Location: [facility];  Service: General;  Laterality: N/A;   jj stent  07/2002   with ureteroscopy, cystoscopy and then removal of stent in office   LAPAROSCOPIC LYSIS OF ADHESIONS N/A 04/22/2021   Procedure: LYSIS OF ADHESIONS;  Surgeon: Adalberto Acton, MD;  Location: [facility];  Service: General;  Laterality: N/A;   LITHOTRIPSY     LYMPH NODE BIOPSY Right 03/06/2014   Procedure: right groin LYMPH NODE BIOPSY;  Surgeon: Mayme Spearman, MD;  Location: [facility];  Service: General;  Laterality: Right;   LYMPHADENECTOMY N/A 12/29/2015   Procedure: RETROPERITONEAL LYMPHADENECTOMY;  Surgeon: Osborn Blaze, MD;  Location: [facility];  Service: Urology;  Laterality: N/A;   NECK SURGERY  2002   ROBOT  ASSISTED LAPAROSCOPIC NEPHRECTOMY Left 12/29/2015   Procedure: XI ROBOTIC ASSISTED LEFT LAPAROSCOPIC NEPHRECTOMY;  Surgeon: Osborn Blaze, MD;  Location: [facility];  Service: Urology;  Laterality: Left;   TONSILLECTOMY  1976   Past Surgical History:  Procedure Laterality Date    kidney stones     ABDOMINAL ADHESION SURGERY     ABDOMINAL HYSTERECTOMY  1995   partial- hemmoraged after surgery-stitch came loose   APPENDECTOMY  1993   hemmoraged after surgery- stitch came loose  CERVICAL DISC SURGERY  06/04/2001   with fusion   CHOLECYSTECTOMY  1985   colonoscopy     COLONOSCOPY     CYSTOSCOPY     FOOT SURGERY     lt   INCISIONAL HERNIA REPAIR N/A 04/22/2021   Procedure: LAPAROSCOPIC INCISIONAL HERNIA REPAIR WITH MESH;  Surgeon: Adalberto Acton, MD;  Location: [facility];  Service: General;  Laterality: N/A;   jj stent  07/2002   with ureteroscopy, cystoscopy and then removal of stent in office   LAPAROSCOPIC LYSIS OF ADHESIONS N/A 04/22/2021   Procedure: LYSIS OF ADHESIONS;  Surgeon: Adalberto Acton, MD;  Location: [facility];  Service: General;  Laterality: N/A;   LITHOTRIPSY     LYMPH NODE BIOPSY Right 03/06/2014   Procedure: right groin LYMPH NODE BIOPSY;  Surgeon: Mayme Spearman, MD;  Location: [facility];  Service: General;  Laterality: Right;   LYMPHADENECTOMY N/A 12/29/2015   Procedure: RETROPERITONEAL LYMPHADENECTOMY;  Surgeon: Osborn Blaze, MD;  Location: [facility];  Service: Urology;  Laterality: N/A;   NECK SURGERY  2002   ROBOT ASSISTED LAPAROSCOPIC NEPHRECTOMY Left 12/29/2015   Procedure: XI ROBOTIC ASSISTED LEFT LAPAROSCOPIC NEPHRECTOMY;  Surgeon: Osborn Blaze, MD;  Location: [facility];  Service: Urology;  Laterality: Left;   TONSILLECTOMY  1976   Past Medical History:  Diagnosis Date   Anxiety    Arthritis    Asthma    Bipolar 2 disorder (HCC)    pt stated, "I have Bipolar 2 and it is remission"   Bipolar affective (HCC)    CAD (coronary artery disease),  native artery transplanted heart    Nonobstructive by coronary CTA 08/2021 with less than 25% stenosis in the LAD, RCA and left circumflex.   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   Full dentures    Gout    Headache(784.0)    migraines   Herpesviral infection    Hypertension    IBS (irritable bowel syndrome)    with diarrhea   Lymphadenopathy    Myalgia and myositis, unspecified    Nephrolithiasis 02/11/2013   PAT (paroxysmal atrial tachycardia) (HCC)    asymptomatic 10 beat run on event monitor 09/2020   PFO (patent foramen ovale)    Small PFO noted on coronary CTA   Pneumonia    PONV (postoperative nausea and vomiting)    PTSD (post-traumatic stress disorder)    Renal calculi    Restless legs syndrome (RLS)    Thoracic radiculopathy    Vitamin D  deficiency    Wears glasses    BP (!) 140/70 (BP Location: Left Arm, Patient Position: Sitting, Cuff Size: Normal)   Pulse 100   Ht 5' 5.5" (1.664 m)   Wt 156 lb 6.4 oz (70.9 kg)   SpO2 95%   BMI 25.63 kg/m   Opioid Risk Score:   Fall Risk Score:  `1  Depression screen [facility] 2/9     01/16/2024   11:12 AM 10/11/2023   11:49 AM 08/27/2023    1:54 PM 07/31/2023    2:20 PM 07/18/2023   11:09 AM 06/15/2023   10:38 AM 11/17/2022    1:08 PM  Depression screen PHQ 2/9  Decreased Interest 0 1 0 1 1 0 0  Down, Depressed, Hopeless 0 1 0 3 1 0 0  PHQ - 2 Score 0 2 0 4 2 0 0  Altered sleeping 0   0  0   Tired, decreased energy 0   0  1   Change in appetite 0   0  0   Feeling bad or failure about yourself  0  0 0  0   Trouble concentrating 0   0  0   Moving slowly or fidgety/restless 1   0  1   Suicidal thoughts 0   0  0   PHQ-9 Score 1   4  2    Difficult doing work/chores Not difficult at all    Somewhat difficult  Not difficult at all      Review of Systems  Gastrointestinal:        IBS  Musculoskeletal:  Positive for arthralgias, back pain, gait problem, joint swelling, myalgias and neck pain.       Bilateral pain in the shoulders, legs, ankles, feet, knees, migraines, neck pain.  Patient reports recent fall at home that has caused arm, elbow and lower extremity pain.    Neurological:  Positive for dizziness.       Vertigo  All other systems reviewed and are negative.      Objective:   Physical Exam Vitals and nursing note reviewed.  Constitutional:      Appearance: Normal appearance.  Neck:     Comments: Cervical Paraspinal : C-5-C-6 Musculoskeletal:     Comments: Normal Muscle Bulk and Muscle Testing Reveals:  Upper Extremities: Full ROM and Muscle Strength 5/5  Thoracic  and Lumbar Hypersensitivity Bilateral Greater Trochanter Tenderness Lower Extremities: Full ROM and Muscle Strength 5/5 Arises from Table Slowly using walker for support Antalgic  Gait     Neurological:     Mental Status: She is alert.         Assessment & Plan:  History of fibromyalgia with myofascial pain and multiple trigger points.Continue with Heat and exercise Regime.   Continue with current medication regimen with  gabapentin . 02/19/2024 2 Chronic migraine headaches..Continue current medication regimen with Almotriptan , she has a scheduled appointment wth Dr Festus Hubert on 04/02/2024 Continue to Monitor. S/P Botox.on 05/30/2017. 02/19/2024 3. Midline Low Back Pain/ Lumbar Spondylosis/Lumbar degenerative disk disease, L4-5/ Lumbar Radiculitis: Continue with HEP and current medication regimen. Continue Hydrocodone  5/325 mg one tablet every 8 hours as needed #80. Aaron AasContinue with slow weaning of Hydrocodone .  02/19/2024 4. 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 Urology Following. 02/19/2024. 5. 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 02/19/2024 6.  Cervicalgia/ Cervical RadiculitisCervical Spondylosis:  Continue current medication regiment with  Gabapentin . Continue  with HEP and Continue to monitor. 02/19/2024 7. Muscle Spasm: Myofascial Pain: She is svcheduled for Trigger Point Injection with Dr Rachel Budds : Continue current medication regimen with Flexeril  as needed. 02/19/2024 8. Bilateral Greater Trochanter Bursitis:  Continue to alternate with ice and heat therapy. Continue HEP as Tolerated. Continue to monitor. 02/19/2024 9. Bilateral Knee Pain: S/P Knee Injection by Dr Julio Ohm on 02/23/2020. Orthopedics Following. Continue to Monitor. 02/19/2024  10. Left Groin Pain: No complaints today.Ms. Arman reports Oncology Following. Continue to Monitor. 02/19/2024.  11. Fall at Home: No Falls this month. Educated on falls prevention, she verbalizes understanding. Continue to Monitor. 02/19/2024  12. Reford Canterbury Tenosynovitis: Ortho Following. . We will Continue to Monitor. 02/19/2024  F/U in 1  month

## 2024-02-22 ENCOUNTER — Ambulatory Visit: Admitting: Cardiology

## 2024-02-22 ENCOUNTER — Telehealth: Payer: Self-pay | Admitting: Cardiology

## 2024-02-22 ENCOUNTER — Other Ambulatory Visit: Payer: Self-pay | Admitting: Cardiology

## 2024-02-22 LAB — TOXASSURE SELECT,+ANTIDEPR,UR

## 2024-02-22 MED ORDER — FUROSEMIDE 20 MG PO TABS: 20.0000 mg | ORAL_TABLET | Freq: Every day | ORAL | 1 refills | Status: DC | PRN

## 2024-02-22 NOTE — Telephone Encounter (Signed)
 Pt's medication was sent to pt's pharmacy as requested. Confirmation received.

## 2024-02-22 NOTE — Telephone Encounter (Signed)
*  STAT* If patient is at the pharmacy, call can be transferred to refill team.   1. Which medications need to be refilled? (please list name of each medication and dose if known) furosemide  (LASIX ) 20 MG tablet   2. Which pharmacy/location (including street and city if local pharmacy) is medication to be sent to? [id number] - [place], [place] - [address] AT [address]   3. Do they need a 30 day or 90 day supply? 30

## 2024-02-27 ENCOUNTER — Ambulatory Visit: Payer: Medicare Other | Admitting: Family Medicine

## 2024-02-28 ENCOUNTER — Other Ambulatory Visit: Payer: Self-pay

## 2024-02-28 ENCOUNTER — Ambulatory Visit: Payer: Self-pay

## 2024-02-28 MED ORDER — PROMETHAZINE HCL 25 MG PO TABS: ORAL_TABLET | ORAL | 0 refills | Status: DC

## 2024-02-28 NOTE — Telephone Encounter (Signed)
 Chief Complaint: migraine Symptoms: N/V, recent fall/head injury Frequency: chronic Pertinent Negatives: Patient denies fever, SOB, CP Disposition: [] ED /[] Urgent Care (no appt availability in office) / [] Appointment(In office/virtual)/ []  [facility]/ [] Home Care/ [] Refused Recommended Disposition /[] [facility]/ [x]  Follow-up with PCP Additional Notes: Pt c/o migraine headaches that started yesterday. Pt does endorse having chronic migraines and takes Promethazine  for relief. Pt is requesting additional refill, as she only has 2 pills left. Of note, Triager had some difficulty getting answers during call to evaluate d/t pt's flight of consciousness. But pt did endorse a recent fall/head injury. Triager unable to schedule acute visit d/t no access.  Additionally, pt reports missing appt with PCP yesterday d/t migraine and transportation issue, so triager rescheduled OV. Triager will also pend Rx per pt request and send encounter to Dr. Loralie Rocher office to further review and advise. Patient verbalized understanding and to call back with worsening symptoms.    Copied from CRM [id number]. Topic: Clinical - Red Word Triage >> Feb 28, 2024 11:57 AM Baldomero Bone wrote: Red Word that prompted transfer to Nurse Triage: Patient woke up with a really bad migraine. Patient needs promethazine  filled. Dealing with a lot of stress. Callback number is [phone]. Reason for Disposition  [1] Prescription refill request for NON-ESSENTIAL medicine (i.e., no harm to patient if med not taken) AND [2] triager unable to refill per department policy  Answer Assessment - Initial Assessment Questions 1. LOCATION: "Where does it hurt?"      Migraine with N/V 2. ONSET: "When did the headache start?" (Minutes, hours or days)      yesterday 3. PATTERN: "Does the pain come and go, or has it been constant since it started?"     constant 4. SEVERITY: "How bad is the pain?" and "What does it keep you from  doing?"  (e.g., Scale 1-10; mild, moderate, or severe)   - MILD (1-3): doesn't interfere with normal activities    - MODERATE (4-7): interferes with normal activities or awakens from sleep    - SEVERE (8-10): excruciating pain, unable to do any normal activities        Moderate-severe Pt endorses migraine mimics stroke like sx 5. RECURRENT SYMPTOM: "Have you ever had headaches before?" If Yes, ask: "When was the last time?" and "What happened that time?"      Yes, a migraine a few days ago 6. CAUSE: "What do you think is causing the headache?"     migraine 7. MIGRAINE: "Have you been diagnosed with migraine headaches?" If Yes, ask: "Is this headache similar?"      yes 8. HEAD INJURY: "Has there been any recent injury to the head?"      Endorses fall in the parking lot recently - "hit whole body" 9. OTHER SYMPTOMS: "Do you have any other symptoms?" (fever, stiff neck, eye pain, sore throat, cold symptoms)     Reports pain on L side of head going into eyes/side of face 10. PREGNANCY: "Is there any chance you are pregnant?" "When was your last menstrual period?"       N/a  Answer Assessment - Initial Assessment Questions 1. DRUG NAME: "What medicine do you need to have refilled?"     promethazine  (PHENERGAN ) 25 MG tablet 2. REFILLS REMAINING: "How many refills are remaining?" (Note: The label on the medicine or pill bottle will show how many refills are remaining. If there are no refills remaining, then a renewal may be needed.)  0 3. EXPIRATION DATE: "What is the expiration date?" (Note: The label states when the prescription will expire, and thus can no longer be refilled.)     N/a 4. PRESCRIBING HCP: "Who prescribed it?" Reason: If prescribed by specialist, call should be referred to that group.     Laneta Pintos, MD 5. SYMPTOMS: "Do you have any symptoms?"     Current migraine, has only 2 tabs left 6. PREGNANCY: "Is there any chance that you are pregnant?" "When was your last  menstrual period?"     N/a  Protocols used: Headache-A-AH, Medication Refill and Renewal Call-A-AH

## 2024-03-19 ENCOUNTER — Other Ambulatory Visit: Payer: Self-pay | Admitting: Family Medicine

## 2024-03-19 ENCOUNTER — Encounter: Payer: Self-pay | Admitting: Physical Medicine & Rehabilitation

## 2024-03-19 ENCOUNTER — Encounter: Attending: Physical Medicine & Rehabilitation | Admitting: Physical Medicine & Rehabilitation

## 2024-03-19 VITALS — BP 153/84 | HR 105

## 2024-03-19 DIAGNOSIS — M797 Fibromyalgia: Secondary | ICD-10-CM | POA: Diagnosis present

## 2024-03-19 DIAGNOSIS — M7918 Myalgia, other site: Secondary | ICD-10-CM | POA: Insufficient documentation

## 2024-03-19 DIAGNOSIS — G894 Chronic pain syndrome: Secondary | ICD-10-CM | POA: Diagnosis not present

## 2024-03-19 MED ORDER — LIDOCAINE HCL 1 % IJ SOLN: 10.0000 mL | Freq: Once | INTRAMUSCULAR | Status: AC

## 2024-03-19 MED ORDER — HYDROCODONE-ACETAMINOPHEN 5-325 MG PO TABS: 1.0000 | ORAL_TABLET | Freq: Three times a day (TID) | ORAL | 0 refills | Status: DC | PRN

## 2024-03-19 NOTE — Patient Instructions (Signed)
 ALWAYS FEEL FREE TO CALL OUR OFFICE WITH ANY PROBLEMS OR QUESTIONS [phone])  **PLEASE NOTE** ALL MEDICATION REFILL REQUESTS (INCLUDING CONTROLLED SUBSTANCES) NEED TO BE MADE AT LEAST 7 DAYS PRIOR TO REFILL BEING DUE. ANY REFILL REQUESTS INSIDE THAT TIME FRAME MAY RESULT IN DELAYS IN RECEIVING YOUR PRESCRIPTION.

## 2024-03-19 NOTE — Progress Notes (Signed)
 Procedure visit  Dx: Myofascial Pain, multiple areas   After informed consent and preparation of the skin with isopropyl alcohol , I injected the left rhomboids, left lats, right lumbar paraspinals (x2) each with 2cc of 1% lidocaine . The patient tolerated well, and no complications were experienced. Post-injection instructions were provided.   She also discussed a recent fall she had at [facility]. She is concerned that her gabapentin  and/or lorazepam  are contributing and will discuss with dr. Kieran Pellet whether she needs to continue or not.   I refilled hydrocodone  as well today too as pharmacy didn't have the DNFB rx per pt   Follow up with NP in a month.

## 2024-04-01 ENCOUNTER — Ambulatory Visit: Admitting: Family Medicine

## 2024-04-01 ENCOUNTER — Ambulatory Visit: Payer: Self-pay

## 2024-04-01 ENCOUNTER — Telehealth: Payer: Self-pay | Admitting: *Deleted

## 2024-04-01 NOTE — Progress Notes (Deleted)
 NEUROLOGY CONSULTATION NOTE  Patty Salas MRN: [id number] DOB: 27-May-1956  Referring provider: Toribio Slain, MD Primary care provider: Toribio Slain, MD  Reason for consult:  migraines  Assessment/Plan:   ***   Subjective:  Patty Salas is a 68 year old right-handed female CAD, HTN, chronic pain syndrome, IBS, PTSD, Bipolar 2 disorder, s/p C5-C7 ACDF, and history of follicular lymphoma grade I, renal cell carcinoma and kidney stones who presents for migraines.  History supplemented by prior neurologists' and referring provider's notes.  Imaging below personally reviewed.  History of migraines since ***. Described as ***/10 right sided *** Associated with nausea, vomiting, photophobia, phonophobia, left sided numbness and weakness, left sided facial droop, slurred speech *** Endorses visual aura of mathematical figures in her vision. Triggers include change in weather and emotional stress.  Relieving factors include ***  Treatment by her previous neurologists has been challenging as she has endorsed side effects to medications or that she couldn't afford medications.  In addition, she is treated for chronic pain with ***  08/21/2023 CT HEAD WO:  No acute intracranial abnormality.  Unchanged mild chronic small-vessel disease. Cortical gray-white differentiation is otherwise preserved. 04/10/2023 MRI C-SPINE WO:  1. Status post ACDF C5-C7, with adjacent segment disease at C4-C5, where there is mild-to-moderate spinal canal stenosis and mild left neural foraminal narrowing. 2. C7-T1 mild bilateral neural foraminal narrowing. 3. C2-C3 and C3-C4 mild right neural foraminal narrowing.  Past NSAIDS/analgesics:  Fentanyl  patch, Roxicet, Toradol injection Past abortive triptans:  sumatriptan (allergy), Zomig  Past abortive ergotamine:  *** Past muscle relaxants:  *** Past anti-emetic:  *** Past antihypertensive medications:  metoprolol , verapamil Past antidepressant medications:   *** Past anticonvulsant medications:  topiramate, Depakote (allergy), gabapentin  Past anti-CGRP:  *** Past vitamins/Herbal/Supplements:  *** Past antihistamines/decongestants:  *** Other past therapies:  Botox (caused neck swelling)  Current NSAIDS/analgesics:  Tylenol , Norco (pain management) Current triptans:  Axert  12.5mg  Current ergotamine:  none Current anti-emetic:  Phenergan  25mg  Current muscle relaxants:  Flexeril  10mg  TID (pain management) Current Antihypertensive medications:  furosemide  20mg  daily PRN (edema) Current Antidepressant medications:  none Current Anticonvulsant medications:  none Current anti-CGRP:  Has not been able to afford CGRP inhibitor injections in the past.  She declines Vyepti due to concerns of potential IV reaction.   Current Vitamins/Herbal/Supplements:  *** Current Antihistamines/Decongestants:  *** Other therapy:  ***   Caffeine:  *** Alcohol :  *** Smoker:  *** Diet:  *** Exercise:  *** Depression:  ***; Anxiety:  *** Other pain:  *** Sleep hygiene:  *** Family history of headache:  ***      PAST MEDICAL HISTORY: Past Medical History:  Diagnosis Date   Anxiety    Arthritis    Asthma    Bipolar 2 disorder (HCC)    pt stated, I have Bipolar 2 and it is remission   Bipolar affective (HCC)    CAD (coronary artery disease), native artery transplanted heart    Nonobstructive by coronary CTA 08/2021 with less than 25% stenosis in the LAD, RCA and left circumflex.   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   Full dentures    Gout    Headache(784.0)    migraines  Herpesviral infection    Hypertension    IBS (irritable bowel syndrome)    with diarrhea   Lymphadenopathy    Myalgia and myositis,  unspecified    Nephrolithiasis 02/11/2013   PAT (paroxysmal atrial tachycardia) (HCC)    asymptomatic 10 beat run on event monitor 09/2020   PFO (patent foramen ovale)    Small PFO noted on coronary CTA   Pneumonia    PONV (postoperative nausea and vomiting)    PTSD (post-traumatic stress disorder)    Renal calculi    Restless legs syndrome (RLS)    Thoracic radiculopathy    Vitamin D  deficiency    Wears glasses     PAST SURGICAL HISTORY: Past Surgical History:  Procedure Laterality Date    kidney stones     ABDOMINAL ADHESION SURGERY     ABDOMINAL HYSTERECTOMY  1995   partial- hemmoraged after surgery-stitch came loose   APPENDECTOMY  1993   hemmoraged after surgery- stitch came loose   CERVICAL DISC SURGERY  06/04/2001   with fusion   CHOLECYSTECTOMY  1985   colonoscopy     COLONOSCOPY     CYSTOSCOPY     FOOT SURGERY     lt   INCISIONAL HERNIA REPAIR N/A 04/22/2021   Procedure: LAPAROSCOPIC INCISIONAL HERNIA REPAIR WITH MESH;  Surgeon: Signe Mitzie LABOR, MD;  Location: [facility];  Service: General;  Laterality: N/A;   jj stent  07/2002   with ureteroscopy, cystoscopy and then removal of stent in office   LAPAROSCOPIC LYSIS OF ADHESIONS N/A 04/22/2021   Procedure: LYSIS OF ADHESIONS;  Surgeon: Signe Mitzie LABOR, MD;  Location: [facility];  Service: General;  Laterality: N/A;   LITHOTRIPSY     LYMPH NODE BIOPSY Right 03/06/2014   Procedure: right groin LYMPH NODE BIOPSY;  Surgeon: Deward GORMAN Curvin DOUGLAS, MD;  Location: [facility];  Service: General;  Laterality: Right;   LYMPHADENECTOMY N/A 12/29/2015   Procedure: RETROPERITONEAL LYMPHADENECTOMY;  Surgeon: Ricardo Likens, MD;  Location: [facility];  Service: Urology;  Laterality: N/A;   NECK SURGERY  2002   ROBOT ASSISTED LAPAROSCOPIC NEPHRECTOMY Left 12/29/2015   Procedure: XI ROBOTIC ASSISTED LEFT LAPAROSCOPIC NEPHRECTOMY;  Surgeon: Ricardo Likens, MD;  Location: [facility];  Service: Urology;  Laterality: Left;   TONSILLECTOMY   1976    MEDICATIONS: Current Outpatient Medications on File Prior to Visit  Medication Sig Dispense Refill   acetaminophen  (TYLENOL ) 500 MG tablet Take 500 mg by mouth every 6 (six) hours as needed for moderate pain.     almotriptan  (AXERT ) 12.5 MG tablet Take 1 tablet (12.5 mg total) by mouth as needed for migraine. may repeat in 2 hours if needed 24 tablet 1   bisacodyl  (DULCOLAX) 5 MG EC tablet Take 10 mg by mouth daily as needed for mild constipation.      cyclobenzaprine  (FLEXERIL ) 10 MG tablet Take 1 tablet (10 mg total) by mouth 3 (three) times daily. 90 tablet 4   docusate sodium  (COLACE) 100 MG capsule Take 100 mg by mouth daily as needed for mild constipation.      EPINEPHrine  0.3 mg/0.3 mL IJ SOAJ injection Inject 0.3 mg into the muscle as needed for anaphylaxis. 2 each 2   furosemide  (LASIX ) 20 MG tablet Take 1 tablet (20 mg total) by mouth daily as needed for edema. 30 tablet 1   gabapentin  (NEURONTIN ) 300 MG capsule Take 2 capsules by mouth at bedtime.     HYDROcodone -acetaminophen  (NORCO/VICODIN) 5-325 MG tablet Take  1 tablet by mouth every 8 (eight) hours as needed for moderate pain (pain score 4-6). 80 tablet 0   hyoscyamine  (ANASPAZ ) 0.125 MG TBDP disintergrating tablet DISSOLVE 1 TABLET(0.125 MG) UNDER THE TONGUE EVERY 4 HOURS FOR UP TO 10 DAYS AS NEEDED 30 tablet 0   Loperamide HCl (IMODIUM A-D PO)      LORazepam  (ATIVAN ) 1 MG tablet Take 1 tablet by mouth 3 (three) times daily.     promethazine  (PHENERGAN ) 25 MG tablet TAKE 1/2 TABLET BY MOUTH EVERY 6 HOURS AS NEEDED FOR NAUSEA OR VOMITING 15 tablet 0   Trospium  Chloride 60 MG CP24 Take 1 capsule by mouth every morning.      VENTOLIN  HFA 108 (90 Base) MCG/ACT inhaler INHALE 2 PUFFS INTO THE LUNGS EVERY 6 HOURS AS NEEDED FOR WHEEZE 18 each 1   Current Facility-Administered Medications on File Prior to Visit  Medication Dose Route Frequency Provider Last Rate Last Admin   lidocaine  (XYLOCAINE ) 1 % (with pres) injection 10  mL  10 mL Other Once         ALLERGIES: Allergies  Allergen Reactions   Aripiprazole Anaphylaxis    Stiffened muscles, extrapyramidal effects all over body.  Other reaction(s): Other (See Comments) Stiffened muscles Stiffened muscles, extrapyramidal effects all over body.  Stiffened muscles Stiffened muscles, extrapyramidal effects all over body.     Aspirin Hives, Itching and Anaphylaxis   Bromfenac Sodium Anaphylaxis   Chlorpromazine Anaphylaxis    Other reaction(s): Other (See Comments) Stiffened muscles Stiffened muscles    Cymbalta [Duloxetine Hcl] Anaphylaxis    Suicidal if in combination w/ Axert    Divalproex Sodium Anaphylaxis   Duloxetine Anaphylaxis    Suicidal if in combination w/ Axert  Suicidal if in combination w/ Axert    Duract [Bromfenac] Anaphylaxis   Flurazepam Anaphylaxis   Hydrochlorothiazide Anaphylaxis    Pt loses facial movement uncontrolled;  Pt loses facial movement uncontrolled;  Pt loses facial movement uncontrolled;    Imitrex [Sumatriptan Succinate] Anaphylaxis   Latex Anaphylaxis   Mellaril Anaphylaxis   Metoclopramide Anaphylaxis and Other (See Comments)    Headaches.  Other reaction(s): Headache, Other (See Comments) headache Headaches.  headache Headaches.   Other reaction(s): headache   Olanzapine Anaphylaxis   Olanzapine Anaphylaxis and Other (See Comments)    Unknown    Other Itching, Other (See Comments), Hives, Nausea And Vomiting, Rash and Anaphylaxis    Dust, mold, feathers, wool, flannel, cologne, chlorox, household cleaning products, scented candles, cinnamon, ivory soap, paints, sun sentitive, acidic fruits, ( lemons, limes, strawbery, tartrazine yellow#5 and 6 in foods, MSG food additives, sudiium lauryl sulates, hot peppers, spearmint, wintergreen peppermint, mentol, orange, jalapenos. ---headache, breathing, welps, canker sores inside mouth, uticaria Other reaction(s): Other (See Comments) Potassium-containing  Compounds-Undiluted through IV. --Pain.  Dust, mold, feathers, wool, flannel, cologne, chlorox, household cleaning products, scented candles, cinnamon, ivory soap, paints, sun sentitive, acidic fruits, ( lemons, limes, strawbery, tartrazine yellow#5 and 6 in foods, MSG food additives, sudiium lauryl sulates, hot peppers, spearmint, wintergreen peppermint, mentol, orange, jalapenos. ---headache, breathing, welps, canker sores inside mouth, uticaria Hives on back and looked like sun Dust, mold, feathers, wool, flannel, cologne, chlorox, household cleaning products, scented candles, cinnamon, ivory soap, paints, sun sentitive, acidic fruits, ( lemons, limes, strawbery, tartrazine yellow#5 and 6 in foods, MSG food additives, sudiium lauryl sulates, hot peppers, spearmint, wintergreen peppermint, mentol, orange, jalapenos. ---headache, breathing, welps, canker sores inside mouth, uticaria   Penicillins Anaphylaxis   Quetiapine Anaphylaxis    Other reaction(s):  Other (See Comments) Bad dreams, too sedating. extreme fatigue and bad dreams extreme fatigue and bad dreams Bad dreams, too sedating.   Statins Hives and Anaphylaxis    Other reaction(s): Other (See Comments) Paranoid affect, muscle spasms.  Paranoid affect, muscle spasms.     Sumatriptan Anaphylaxis and Other (See Comments)   Thioridazine Anaphylaxis   Topamax Anaphylaxis and Other (See Comments)    Confusion, tremor, blurred vision   Topiramate Anaphylaxis    Other reaction(s): Other (See Comments) Confusion, tremor, blurred vision Confusion, tremor, blurred vision    Trifluoperazine Anaphylaxis    Other reaction(s): Other (See Comments) Stiffened muscles Stiffened muscles    Aripiprazole Other (See Comments)    Stiffened muscles   Dalmane [Flurazepam Hcl] Other (See Comments)    Stiffened all muscles   Darifenacin Hydrobromide Er Hives    Hives on back and looked like sunburn   Metoclopramide Hcl Other (See Comments)     headache   Seroquel [Quetiapine Fumerate] Other (See Comments)    extreme fatigue and bad dreams   Stelazine Other (See Comments)    Stiffened muscles   Thorazine [Chlorpromazine Hcl] Other (See Comments)    Stiffened muscles   Allopurinol Hives and Other (See Comments)   Aspirin Effervescent Other (See Comments)    Mouth ulcers, swallowing problems.  Mouth ulcers, swallowing problems.  Mouth ulcers, swallowing problems.     Barium Sulfate Itching and Other (See Comments)   Benzocaine Other (See Comments)    orthicoat sprays containing menthol or lemon flavor--breaks inside of mouth out, red rash, mouth ulcers.  Other reaction(s): Other (See Comments) orthicoat sprays containing menthol or lemon flavor--breaks inside of mouth out, red rash, mouth ulcers.  orthicoat sprays containing menthol or lemon flavor--breaks inside of mouth out, red rash, mouth ulcers.    Biofreeze [Menthol (Topical Analgesic)] Hives and Itching   Camphor-Menthol     Other reaction(s): Not available   Capsaicin Itching    Redness  Redness Redness    Capsaicin-Menthol Other (See Comments)   Clindamycin  Itching and Other (See Comments)   Clindamycin /Lincomycin     Bad reflux and loose stools    Clove Oil Itching and Other (See Comments)   Colchicine Hives and Other (See Comments)   Cycloheximide Itching and Other (See Comments)   Darifenacin Hives   Diatrizoate Itching    welps  Other reaction(s): Other (See Comments) welps  welps  Other reaction(s): Not available   Diflucan [Fluconazole] Hives and Itching   Erythromycin Other (See Comments)    Other reaction(s): Unknown   Garlic Itching and Other (See Comments)    Mouth swelling   Iohexol       Code: HIVES, Desc: pt broke out in red rash and hives after CT injection on 12/07/09.-pt needs 13 hr prep kit, Onset Date: 96917988  Other reaction(s): Other (See Comments)  Code: HIVES, Desc: pt broke out in red rash and hives after CT injection on  12/07/09.-pt needs 13 hr prep kit, Onset Date: 96917988  Code: HIVES, Desc: pt broke out in red rash and hives after CT injection on 12/07/09.-pt needs 13 hr prep kit, Onset Date: 96917988   Ivp Dye [Iodinated Contrast Media]    Metronidazole Other (See Comments)   Mirabegron     swelling Other reaction(s): Other (See Comments) swelling swelling   Miralax [Polyethylene Glycol] Nausea And Vomiting   Moxifloxacin Itching    Other reaction(s): Not available   Nsaids     Mouth ulcers, swelling of mouth.  Oxybutynin Diarrhea   Polyethylene Glycol 3350 Itching   Potassium Chloride Itching   Potassium-Containing Compounds     Undiluted through IV. --Pain.    Prednisone  Itching and Other (See Comments)    No  Sleep at night.  Other reaction(s): Insomnia, Other (See Comments) No  Sleep at night.  No  Sleep at night.   Other reaction(s): insomnia   Psyllium Other (See Comments)    Mouth ulcers, swallowing problems. Other reaction(s): Other (See Comments) Mouth ulcers, swallowing problems. Mouth ulcers, swallowing problems.    Red Dye #40 (Allura Red)     Other reaction(s): Unknown   Seroquel [Quetiapine Fumarate]     Bad dreams, too sedating.   Simvastatin Other (See Comments)    Paranoid affect, muscle spasms.    Urocit - K [Potassium Citrate]     Breaks mouth out   Avelox [Moxifloxacin Hcl In Nacl] Nausea And Vomiting   Barium-Containing Compounds Rash   Butalbital-Aspirin-Caffeine Itching, Rash and Other (See Comments)    Welps, Welps, Welps,    Diclofenac  Rash and Other (See Comments)   Doxycycline Nausea Only and Other (See Comments)    Stomach upset.  Stomach upset.  Stomach upset.    E-Mycin [Erythromycin Base] Nausea Only   Flagyl [Metronidazole Hcl] Nausea And Vomiting   Lamotrigine Rash and Other (See Comments)   Pregabalin     Blurry vision, muscle stiffness    Pregabalin Other (See Comments)    Other reaction(s): Other (See Comments) Blurry vision, muscle  stiffness  Blurry vision, muscle stiffness     Risperidone Other (See Comments)    Too sedating   Risperidone Other (See Comments)    Too sedating.  Other reaction(s): Other (See Comments) Too sedating.  Too sedating Too sedating Too sedating.     Toradol [Ketorolac Tromethamine] Itching and Rash    FAMILY HISTORY: Family History  Problem Relation Age of Onset   Cancer Mother        stomach   Migraines Mother    Arthritis Mother    Emphysema Mother    Heart disease Father    Hypertension Father    Cancer Father        colon   Dementia Father    Hypertension Brother    Migraines Brother    Hypertension Brother    Migraines Brother    Alcohol  abuse Brother    Bipolar disorder Brother    Migraines Daughter    Arthritis Maternal Grandmother    Cancer Maternal Grandmother        stomach   Diabetes Maternal Grandmother    Stroke Maternal Grandmother    Cancer Maternal Aunt        lung   Emphysema Maternal Aunt    Cancer Paternal Uncle        colon   Hypertension Maternal Aunt    Heart disease Maternal Aunt    Cancer Paternal Uncle        lung    Objective:  *** General: No acute distress.  Patient appears well-groomed.   Head:  Normocephalic/atraumatic Eyes:  fundi examined but not visualized Neck: supple, no paraspinal tenderness, full range of motion Back: No paraspinal tenderness Heart: regular rate and rhythm Lungs: Clear to auscultation bilaterally. Vascular: No carotid bruits. Neurological Exam: Mental status: alert and oriented to person, place, and time, speech fluent and not dysarthric, language intact. Cranial nerves: CN I: not tested CN II: pupils equal, round and reactive to light, visual fields intact CN  III, IV, VI:  full range of motion, no nystagmus, no ptosis CN V: facial sensation intact. CN VII: upper and lower face symmetric CN VIII: hearing intact CN IX, X: gag intact, uvula midline CN XI: sternocleidomastoid and trapezius muscles  intact CN XII: tongue midline Bulk & Tone: normal, no fasciculations. Motor:  muscle strength 5/5 throughout Sensation:  Pinprick, temperature and vibratory sensation intact. Deep Tendon Reflexes:  2+ throughout,  toes downgoing.   Finger to nose testing:  Without dysmetria.   Heel to shin:  Without dysmetria.   Gait:  Normal station and stride.  Romberg negative.    Thank you for allowing me to take part in the care of this patient.  Juliene Dunnings, DO  CC: ***

## 2024-04-01 NOTE — Telephone Encounter (Signed)
 Provider was aware of this appointment, however they scheduled this as a virtual and provider stated she needed to be seen in person to be evaluated. Informed pt of this and she was not sure at the time she would make it has since called and sent an update which was routed to the provider.

## 2024-04-01 NOTE — Telephone Encounter (Signed)
 Copied from CRM [id number]. Topic: General - Other >> Apr 01, 2024 10:12 AM Selinda RAMAN wrote: The patient called in stating she missed her appointment this morning because she just felt so bad. She wanted to schedule an appointment as soon as possible but I told her there is no availability anytime soon. She says she may go to the Urgent Care and sorry again she missed this morning. She wants her provider to know she has an appointment with Dr Jess her psychiatrist on July 9th where she will speak with him about gabapentin  and lorezepam. She also rescheduled an appointment with Dr Skeet for November 18th because it was scheduled for tomorrow and she is just not feeling up to it enough but she is on the wait list. She will let Dr Chandra know if she goes to Urgent Care by tomorrow and what is going on.

## 2024-04-01 NOTE — Telephone Encounter (Signed)
 FYI Only or Action Required?: Action required by provider: Pt has MyChart VV scheduled for this morning.  Patient was last seen in primary care on 01/16/2024 by Chandra Toribio POUR, MD. Called Nurse Triage reporting Vomiting, diarrhea, sore throat, chills. Symptoms began Sunday - sore throat, other s/s yesterday. Interventions attempted: Prescription medications: Pt has tried imodium , anti nausea medication prescribed.. Symptoms are: unchanged.  Triage Disposition: See HCP Within 4 Hours (Or PCP Triage) Pt has appt for tomorrow and is afraid she will not be able to go. Pt requesting ABX.  Patient/caregiver understands and will follow disposition?: Yes                      Copied from CRM [id number]. Topic: Clinical - Red Word Triage >> Apr 01, 2024  7:58 AM Essie A wrote: Red Word that prompted transfer to Nurse Triage: Sore throat, chills, fever, nausea, diarrhea, vomiting. Reason for Disposition  [1] MILD-MODERATE pain AND [2] constant AND [3] present > 2 hours  Answer Assessment - Initial Assessment Questions 1. LOCATION: Where does it hurt?      Abdomen - vomiting, diarrhea and chills, sore throat 3. ONSET: When did the pain begin? (e.g., minutes, hours or days ago)      Monday - sore throat started Sunday 4. SUDDEN: Gradual or sudden onset?     Fairly sudden 5. PATTERN Does the pain come and go, or is it constant?    - If it comes and goes: How long does it last? Do you have pain now?     (Note: Comes and goes means the pain is intermittent. It goes away completely between bouts.)    - If constant: Is it getting better, staying the same, or getting worse?      (Note: Constant means the pain never goes away completely; most serious pain is constant and gets worse.)      Comes and goes 7. RECURRENT SYMPTOM: Have you ever had this type of stomach pain before? If Yes, ask: When was the last time? and What happened that time?      yes 8. CAUSE: What do  you think is causing the stomach pain?     Unsure - wants abx 10. OTHER SYMPTOMS: Do you have any other symptoms? (e.g., back pain, diarrhea, fever, urination pain, vomiting)       Diarrhea, sore throat chills  Protocols used: Abdominal Pain - Female-A-AH

## 2024-04-01 NOTE — Telephone Encounter (Signed)
 Contacted pt to inform her that the appointment today would need to be in person so she could be evaluated.  She was uncertain that she could make it in person.

## 2024-04-02 ENCOUNTER — Ambulatory Visit: Payer: Medicare Other | Admitting: Neurology

## 2024-04-10 ENCOUNTER — Other Ambulatory Visit: Payer: Self-pay | Admitting: Family Medicine

## 2024-04-10 ENCOUNTER — Encounter: Payer: Self-pay | Admitting: Cardiology

## 2024-04-10 ENCOUNTER — Ambulatory Visit: Attending: Cardiology | Admitting: Cardiology

## 2024-04-10 VITALS — BP 146/92 | HR 102 | Ht 65.5 in | Wt 154.6 lb

## 2024-04-10 DIAGNOSIS — I4719 Other supraventricular tachycardia: Secondary | ICD-10-CM | POA: Diagnosis present

## 2024-04-10 DIAGNOSIS — I10 Essential (primary) hypertension: Secondary | ICD-10-CM | POA: Insufficient documentation

## 2024-04-10 DIAGNOSIS — E785 Hyperlipidemia, unspecified: Secondary | ICD-10-CM | POA: Diagnosis present

## 2024-04-10 DIAGNOSIS — R600 Localized edema: Secondary | ICD-10-CM | POA: Diagnosis present

## 2024-04-10 DIAGNOSIS — I2510 Atherosclerotic heart disease of native coronary artery without angina pectoris: Secondary | ICD-10-CM | POA: Diagnosis present

## 2024-04-10 DIAGNOSIS — R000 Tachycardia, unspecified: Secondary | ICD-10-CM | POA: Insufficient documentation

## 2024-04-10 DIAGNOSIS — I2583 Coronary atherosclerosis due to lipid rich plaque: Secondary | ICD-10-CM | POA: Diagnosis present

## 2024-04-10 MED ORDER — FUROSEMIDE 20 MG PO TABS: ORAL_TABLET | ORAL | 3 refills | Status: AC

## 2024-04-10 MED ORDER — METOPROLOL SUCCINATE ER 25 MG PO TB24: 25.0000 mg | ORAL_TABLET | Freq: Every day | ORAL | 3 refills | Status: AC

## 2024-04-10 NOTE — Progress Notes (Signed)
 Date:  04/10/2024   ID:  Patty Salas, DOB 03-Mar-1956, MRN [id number] The patient was identified using 2 identifiers. No visit PCP:  Chandra Toribio POUR, MD   [facility] Provider Cardiologist:  None     Evaluation Performed:  Follow-Up Visit  Chief Complaint:  LE edema  History of Present Illness:    Patty Salas is a 68 y.o. female with a hx of anxiety, asthma, fibromyalgia, HTN who was initially referred for evaluation of LE edema.  She has a hx of renal cell CA and is s/p nephrectomy.  She has had problems with renal stones. She has a hx of IBS and diarrhea as well.  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     When I saw her in 2021 she had problems with weight gain and LE edema and pinkness of her legs. She has chronic DOE related to asthma and had been on albuterol  but had not been taking it at her last OV.    2D echo showed normal heart function with EF 60-65% with G1DD.  Coronary CTA showed coronary Ca score of 117 with minimal CAD < 1-24% in the LAD/LCx and RCA and a small PFO.    She is here today for followup and is doing well.  She has chronic SOB related to asthma that is controlled on inhalers.  She has chronic LE edema that is controlled on Lasix  20mg  daily.  She denies any chest pain or pressure, PND, orthopnea, dizziness, palpitations or syncope. She has had several falls and uses a walker.  The falls were not associated with dizziness or syncope.  She is compliant with her meds and is tolerating meds with no SE.    Past Medical History:  Diagnosis Date   Anxiety    Arthritis    Asthma    Bipolar 2 disorder (HCC)    pt stated, I have Bipolar 2 and it is remission   Bipolar affective (HCC)    CAD (coronary artery disease), native artery transplanted heart    Nonobstructive by coronary CTA 08/2021 with less than 25% stenosis in the LAD, RCA and left circumflex.   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   Full dentures    Gout    Headache(784.0)    migraines   Herpesviral infection    Hypertension    IBS (irritable bowel syndrome)    with diarrhea   Lymphadenopathy    Myalgia and myositis, unspecified    Nephrolithiasis 02/11/2013   PAT (paroxysmal atrial tachycardia) (HCC)    asymptomatic 10 beat run on event monitor 09/2020   PFO (patent foramen ovale)    Small PFO noted on coronary CTA   Pneumonia    PONV (postoperative nausea and vomiting)    PTSD (post-traumatic stress disorder)    Renal calculi    Restless legs syndrome (RLS)    Thoracic radiculopathy    Vitamin D  deficiency    Wears glasses    Past Surgical History:  Procedure Laterality Date    kidney stones     ABDOMINAL ADHESION SURGERY     ABDOMINAL HYSTERECTOMY  1995   partial- hemmoraged after surgery-stitch came  loose   APPENDECTOMY  1993   hemmoraged after surgery- stitch came loose   CERVICAL DISC SURGERY  06/04/2001   with fusion   CHOLECYSTECTOMY  1985   colonoscopy     COLONOSCOPY     CYSTOSCOPY     FOOT SURGERY     lt   INCISIONAL HERNIA REPAIR N/A 04/22/2021   Procedure: LAPAROSCOPIC INCISIONAL HERNIA REPAIR WITH MESH;  Surgeon: Signe Mitzie LABOR, MD;  Location: [facility];  Service: General;  Laterality: N/A;   jj stent  07/2002   with ureteroscopy, cystoscopy and then removal of stent in office   LAPAROSCOPIC LYSIS OF ADHESIONS N/A 04/22/2021   Procedure: LYSIS OF ADHESIONS;  Surgeon: Signe Mitzie LABOR, MD;  Location: [facility];  Service: General;  Laterality: N/A;   LITHOTRIPSY     LYMPH NODE BIOPSY Right 03/06/2014   Procedure: right groin LYMPH NODE BIOPSY;  Surgeon: Deward GORMAN Curvin DOUGLAS, MD;  Location: [facility];  Service: General;  Laterality: Right;   LYMPHADENECTOMY N/A 12/29/2015    Procedure: RETROPERITONEAL LYMPHADENECTOMY;  Surgeon: Ricardo Likens, MD;  Location: [facility];  Service: Urology;  Laterality: N/A;   NECK SURGERY  2002   ROBOT ASSISTED LAPAROSCOPIC NEPHRECTOMY Left 12/29/2015   Procedure: XI ROBOTIC ASSISTED LEFT LAPAROSCOPIC NEPHRECTOMY;  Surgeon: Ricardo Likens, MD;  Location: [facility];  Service: Urology;  Laterality: Left;   TONSILLECTOMY  1976     Current Meds  Medication Sig   acetaminophen  (TYLENOL ) 500 MG tablet Take 500 mg by mouth every 6 (six) hours as needed for moderate pain.   almotriptan  (AXERT ) 12.5 MG tablet Take 1 tablet (12.5 mg total) by mouth as needed for migraine. may repeat in 2 hours if needed   bisacodyl  (DULCOLAX) 5 MG EC tablet Take 10 mg by mouth daily as needed for mild constipation.    cyclobenzaprine  (FLEXERIL ) 10 MG tablet Take 1 tablet (10 mg total) by mouth 3 (three) times daily.   docusate sodium  (COLACE) 100 MG capsule Take 100 mg by mouth daily as needed for mild constipation.    EPINEPHrine  0.3 mg/0.3 mL IJ SOAJ injection Inject 0.3 mg into the muscle as needed for anaphylaxis.   furosemide  (LASIX ) 20 MG tablet Take 1 tablet (20 mg total) by mouth daily as needed for edema.   gabapentin  (NEURONTIN ) 300 MG capsule Take 2 capsules by mouth at bedtime.   HYDROcodone -acetaminophen  (NORCO/VICODIN) 5-325 MG tablet Take 1 tablet by mouth every 8 (eight) hours as needed for moderate pain (pain score 4-6).   hyoscyamine  (ANASPAZ ) 0.125 MG TBDP disintergrating tablet DISSOLVE 1 TABLET(0.125 MG) UNDER THE TONGUE EVERY 4 HOURS FOR UP TO 10 DAYS AS NEEDED   Loperamide HCl (IMODIUM A-D PO)    LORazepam  (ATIVAN ) 1 MG tablet Take 1 tablet by mouth 3 (three) times daily.   promethazine  (PHENERGAN ) 25 MG tablet TAKE 1/2 TABLET BY MOUTH EVERY 6 HOURS AS NEEDED FOR NAUSEA OR VOMITING   Trospium  Chloride 60 MG CP24 Take 1 capsule by mouth every morning.    VENTOLIN  HFA 108 (90 Base) MCG/ACT inhaler INHALE 2 PUFFS INTO THE LUNGS EVERY 6 HOURS AS  NEEDED FOR WHEEZE   Current Facility-Administered Medications for the 04/10/24 encounter (Office Visit) with Shlomo Wilbert SAUNDERS, MD  Medication   lidocaine  (XYLOCAINE ) 1 % (with pres) injection 10 mL     Allergies:   Aripiprazole, Aspirin, Bromfenac sodium, Chlorpromazine, Cymbalta [duloxetine hcl], Divalproex sodium, Duloxetine, Duract [bromfenac], Flurazepam, Hydrochlorothiazide, Imitrex [sumatriptan succinate], Latex, Mellaril, Metoclopramide,  Olanzapine, Olanzapine, Other, Penicillins, Quetiapine, Statins, Sumatriptan, Thioridazine, Topamax, Topiramate, Trifluoperazine, Aripiprazole, Dalmane [flurazepam hcl], Darifenacin hydrobromide er, Metoclopramide hcl, Seroquel [quetiapine fumerate], Stelazine, Thorazine [chlorpromazine hcl], Allopurinol, Aspirin effervescent, Barium sulfate, Benzocaine, Biofreeze [menthol (topical analgesic)], Camphor-menthol, Capsaicin, Capsaicin-menthol, Clindamycin , Clindamycin /lincomycin, Clove oil, Colchicine, Cycloheximide, Darifenacin, Diatrizoate, Diflucan [fluconazole], Erythromycin, Garlic, Iohexol , Ivp dye [iodinated contrast media], Metronidazole, Mirabegron, Miralax [polyethylene glycol], Moxifloxacin, Nsaids, Oxybutynin, Polyethylene glycol 3350, Potassium chloride, Potassium-containing compounds, Prednisone , Psyllium, Red dye #40 (allura red), Seroquel [quetiapine fumarate], Simvastatin, Urocit - k [potassium citrate], Avelox [moxifloxacin hcl in nacl], Barium-containing compounds, Butalbital-aspirin-caffeine, Diclofenac , Doxycycline, E-mycin [erythromycin base], Flagyl [metronidazole hcl], Lamotrigine, Pregabalin, Pregabalin, Risperidone, Risperidone, and Toradol [ketorolac tromethamine]   Social History   Tobacco Use   Smoking status: Never   Smokeless tobacco: Never  Vaping Use   Vaping status: Never Used  Substance Use Topics   Alcohol  use: No   Drug use: No     Family Hx: The patient's family history includes Alcohol  abuse in her brother; Arthritis in  her maternal grandmother and mother; Bipolar disorder in her brother; Cancer in her father, maternal aunt, maternal grandmother, mother, paternal uncle, and paternal uncle; Dementia in her father; Diabetes in her maternal grandmother; Emphysema in her maternal aunt and mother; Heart disease in her father and maternal aunt; Hypertension in her brother, brother, father, and maternal aunt; Migraines in her brother, brother, daughter, and mother; Stroke in her maternal grandmother.  ROS:   Please see the history of present illness.     All other systems reviewed and are negative.   Prior CV studies:   The following studies were reviewed today:  Coronary CTA and 2D echo  Labs/Other Tests and Data Reviewed:    EKG:  No ECG reviewed.  Recent Labs: 08/10/2023: Magnesium  1.7 08/20/2023: BUN 9; Creatinine, Ser 0.74; Hemoglobin 12.4; Platelets 312; Potassium 3.7; Sodium 139   Recent Lipid Panel Lab Results  Component Value Date/Time   CHOL 276 (H) 08/10/2023 10:24 AM   TRIG 222 (H) 08/10/2023 10:24 AM   HDL 57 08/10/2023 10:24 AM   CHOLHDL 4.8 (H) 08/10/2023 10:24 AM   LDLCALC 177 (H) 08/10/2023 10:24 AM    Wt Readings from Last 3 Encounters:  04/10/24 154 lb 9.6 oz (70.1 kg)  02/19/24 156 lb 6.4 oz (70.9 kg)  01/16/24 151 lb 4.8 oz (68.6 kg)     Risk Assessment/Calculations:          Objective:    Vital Signs:  BP (!) 146/92   Pulse (!) 102   Ht 5' 5.5 (1.664 m)   Wt 154 lb 9.6 oz (70.1 kg)   SpO2 95%   BMI 25.34 kg/m   GEN: Well nourished, well developed in no acute distress HEENT: Normal NECK: No JVD; No carotid bruits LYMPHATICS: No lymphadenopathy CARDIAC:RRR, no murmurs, rubs, gallops RESPIRATORY:  Clear to auscultation without rales, wheezing or rhonchi  ABDOMEN: Soft, non-tender, non-distended MUSCULOSKELETAL:  No edema; No deformity  SKIN: Warm and dry NEUROLOGIC:  Alert and oriented x 3 PSYCHIATRIC:  Normal affect  ASSESSMENT & PLAN:    HTN -BP is  elevated  -she has a lot of allergies to meds but she tolerated the lopressor  that she had for the coronary CTA -will start Toprol  XL 25mg  daily  -I have asked her to check her BP BID x 1 week and then call with results   2.  LE edema -? Whether this is related to her pelvic lymphoma -2D echocardiogram showed normal LV function with  grade 1 diastolic dysfunction -Encouraged to continue less than 2 g sodium diet -Continue Lasix  20 mg daily with an extra 20mg  daily PRN as needed for lower extremity edema -continue to follow with Oncology  3.  Sinus tachycardia/PAT -In the past it was felt likely related to chronic pain and deconditioning -She wore a Zio patch in September 20, 2020 for elevated heart rate and average heart rate was 89 bpm -Maintaining sinus rhythm today and denies any recent palpitations  4.  ASCAD -coronary Ca score of 117 with minimal CAD < 1-24% in the LAD/LCx and RCA -She has not had any anginal symptoms since I saw her last -She gets hives with aspirin -Statin intolerant  5.  HLD -LDL goal is less than 70 -I have personally reviewed and interpreted outside labs performed by patient's PCP which showed LDL 177, HDL 57 in 08/2023 -She has a multitude of allergies including to statins -She would benefit from addition of a PCSK9 inhibitor>> she has been referred multiple times to our pharmacy lipid clinic which she has not showed up for   Medication Adjustments/Labs and Tests Ordered: Current medicines are reviewed at length with the patient today.  Concerns regarding medicines are outlined above.   Tests Ordered: No orders of the defined types were placed in this encounter.   Medication Changes: No orders of the defined types were placed in this encounter.   Follow Up:  In Person in 1 year with extender  Signed, Wilbert Bihari, MD  04/10/2024 11:25 AM    [facility]

## 2024-04-10 NOTE — Patient Instructions (Addendum)
 Medication Instructions:  Please START 25 mg Toprol  daily.   *If you need a refill on your cardiac medications before your next appointment, please call your pharmacy*  Lab Work: None.  If you have labs (blood work) drawn today and your tests are completely normal, you will receive your results only by: MyChart Message (if you have MyChart) OR A paper copy in the mail If you have any lab test that is abnormal or we need to change your treatment, we will call you to review the results.  Testing/Procedures: None.   Follow-Up: At [facility], you and your health needs are our priority.  As part of our continuing mission to provide you with exceptional heart care, our providers are all part of one team.  This team includes your primary Cardiologist (physician) and Advanced Practice Providers or APPs (Physician Assistants and Nurse Practitioners) who all work together to provide you with the care you need, when you need it.  Your next appointment:   1 year(s)  Provider:   One of our Advanced Practice Providers (APPs): Morse Clause, PA-C  Lamarr Satterfield, NP Miriam Shams, NP  Olivia Pavy, PA-C Josefa Beauvais, NP  Leontine Salen, PA-C Orren Fabry, PA-C  [place], PA-C Ernest Dick, NP  Damien Braver, NP Jon Hails, PA-C  Waddell Donath, PA-C    Dayna Dunn, PA-C  Scott Weaver, PA-C Lum Louis, NP Katlyn West, NP Callie Goodrich, PA-C  Evan Williams, PA-C Sheng Haley, PA-C  Xika Zhao, NP Kathleen Johnson, PA-C     We recommend signing up for the patient portal called MyChart.  Sign up information is provided on this After Visit Summary.  MyChart is used to connect with patients for Virtual Visits (Telemedicine).  Patients are able to view lab/test results, encounter notes, upcoming appointments, etc.  Non-urgent messages can be sent to your provider as well.   To learn more about what you can do with MyChart, go to [email or web address].   Other  Instructions Please check your blood pressure twice a day for one week. Check your blood pressure at lunch and at dinner. Write your readings down along with the date and time, then drop off readings at our front desk. You can also send your readings over MyChart or call our main number to give readings to our operators.   Dr. Shlomo has referred you to the lipid clinic. Someone will call you to set up an appointment.

## 2024-04-10 NOTE — Addendum Note (Signed)
 Addended by: JANIT GENI CROME on: 04/10/2024 11:41 AM   Modules accepted: Orders

## 2024-04-17 NOTE — Progress Notes (Unsigned)
 Subjective:    Patient ID: Patty Salas, female    DOB: 16-May-1956, 68 y.o.   MRN: [id number]  HPI   Pain Inventory Average Pain {NUMBERS; 0-10:5044} Pain Right Now {NUMBERS; 0-10:5044} My pain is {PAIN DESCRIPTION:21022940}  In the last 24 hours, has pain interfered with the following? General activity {NUMBERS; 0-10:5044} Relation with others {NUMBERS; 0-10:5044} Enjoyment of life {NUMBERS; 0-10:5044} What TIME of day is your pain at its worst? {time of day:24191} Sleep (in general) {BHH GOOD/FAIR/POOR:22877}  Pain is worse with: {ACTIVITIES:21022942} Pain improves with: {PAIN IMPROVES TPUY:78977056} Relief from Meds: {NUMBERS; 0-10:5044}  Family History  Problem Relation Age of Onset   Cancer Mother        stomach   Migraines Mother    Arthritis Mother    Emphysema Mother    Heart disease Father    Hypertension Father    Cancer Father        colon   Dementia Father    Hypertension Brother    Migraines Brother    Hypertension Brother    Migraines Brother    Alcohol  abuse Brother    Bipolar disorder Brother    Migraines Daughter    Arthritis Maternal Grandmother    Cancer Maternal Grandmother        stomach   Diabetes Maternal Grandmother    Stroke Maternal Grandmother    Cancer Maternal Aunt        lung   Emphysema Maternal Aunt    Cancer Paternal Uncle        colon   Hypertension Maternal Aunt    Heart disease Maternal Aunt    Cancer Paternal Uncle        lung   Social History   Socioeconomic History   Marital status: Married    Spouse name: Not on file   Number of children: 1   Years of education: COLLEGE1   Highest education level: Some college, no degree  Occupational History   Occupation: HOUSEWIFE    Associate Professor: UNEMPLOYED  Tobacco Use   Smoking status: Never   Smokeless tobacco: Never  Vaping Use   Vaping status: Never Used  Substance and Sexual Activity   Alcohol  use: No   Drug use: No   Sexual activity: Not on file  Other  Topics Concern   Not on file  Social History Narrative   Patient is right handed.   Patient drinks caffeine occasionally.   Social Drivers of Corporate investment banker Strain: Low Risk  (11/19/2023)   Overall Financial Resource Strain (CARDIA)    Difficulty of Paying Living Expenses: Not very hard  Food Insecurity: No Food Insecurity (12/20/2023)   Hunger Vital Sign    Worried About Running Out of Food in the Last Year: Never true    Ran Out of Food in the Last Year: Never true  Transportation Needs: Unmet Transportation Needs (12/20/2023)   PRAPARE - Transportation    Lack of Transportation (Medical): Yes    Lack of Transportation (Non-Medical): Yes  Physical Activity: Inactive (11/19/2023)   Exercise Vital Sign    Days of Exercise per Week: 0 days    Minutes of Exercise per Session: 0 min  Stress: No Stress Concern Present (11/19/2023)   [employer] of Occupational Health - Occupational Stress Questionnaire    Feeling of Stress : Only a little  Social Connections: Moderately Integrated (11/19/2023)   Social Connection and Isolation Panel    Frequency of Communication with Friends and Family: More  than three times a week    Frequency of Social Gatherings with Friends and Family: Once a week    Attends Religious Services: More than 4 times per year    Active Member of Clubs or Organizations: No    Attends Engineer, structural: Never    Marital Status: Married   Past Surgical History:  Procedure Laterality Date    kidney stones     ABDOMINAL ADHESION SURGERY     ABDOMINAL HYSTERECTOMY  1995   partial- hemmoraged after surgery-stitch came loose   APPENDECTOMY  1993   hemmoraged after surgery- stitch came loose   CERVICAL DISC SURGERY  06/04/2001   with fusion   CHOLECYSTECTOMY  1985   colonoscopy     COLONOSCOPY     CYSTOSCOPY     FOOT SURGERY     lt   INCISIONAL HERNIA REPAIR N/A 04/22/2021   Procedure: LAPAROSCOPIC INCISIONAL HERNIA REPAIR WITH MESH;   Surgeon: Signe Mitzie LABOR, MD;  Location: [facility];  Service: General;  Laterality: N/A;   jj stent  07/2002   with ureteroscopy, cystoscopy and then removal of stent in office   LAPAROSCOPIC LYSIS OF ADHESIONS N/A 04/22/2021   Procedure: LYSIS OF ADHESIONS;  Surgeon: Signe Mitzie LABOR, MD;  Location: [facility];  Service: General;  Laterality: N/A;   LITHOTRIPSY     LYMPH NODE BIOPSY Right 03/06/2014   Procedure: right groin LYMPH NODE BIOPSY;  Surgeon: Deward GORMAN Curvin DOUGLAS, MD;  Location: [facility];  Service: General;  Laterality: Right;   LYMPHADENECTOMY N/A 12/29/2015   Procedure: RETROPERITONEAL LYMPHADENECTOMY;  Surgeon: Ricardo Likens, MD;  [facility]: [facility];  Service: Urology;  Laterality: N/A;   NECK SURGERY  2002   ROBOT ASSISTED LAPAROSCOPIC NEPHRECTOMY Left 12/29/2015   Procedure: XI ROBOTIC ASSISTED LEFT LAPAROSCOPIC NEPHRECTOMY;  Surgeon: Ricardo Likens, MD;  [facility];  Service: Urology;  Laterality: Left;   TONSILLECTOMY  1976   Past Surgical History:  Procedure Laterality Date    kidney stones     ABDOMINAL ADHESION SURGERY     ABDOMINAL HYSTERECTOMY  1995   partial- hemmoraged after surgery-stitch came loose   APPENDECTOMY  1993   hemmoraged after surgery- stitch came loose   CERVICAL DISC SURGERY  06/04/2001   with fusion   CHOLECYSTECTOMY  1985   colonoscopy     COLONOSCOPY     CYSTOSCOPY     FOOT SURGERY     lt   INCISIONAL HERNIA REPAIR N/A 04/22/2021   Procedure: LAPAROSCOPIC INCISIONAL HERNIA REPAIR WITH MESH;  Surgeon: Signe Mitzie LABOR, MD;  [facility];  Service: General;  Laterality: N/A;   jj stent  07/2002   with ureteroscopy, cystoscopy and then removal of stent in office   LAPAROSCOPIC LYSIS OF ADHESIONS N/A 04/22/2021   Procedure: LYSIS OF ADHESIONS;  Surgeon: Signe Mitzie LABOR, MD;  [facility]: [facility];  Service: General;  Laterality: N/A;   LITHOTRIPSY     LYMPH NODE BIOPSY Right 03/06/2014   Procedure: right groin LYMPH NODE BIOPSY;   Surgeon: Deward GORMAN Curvin DOUGLAS, MD;  Location: [facility];  Service: General;  Laterality: Right;   LYMPHADENECTOMY N/A 12/29/2015   Procedure: RETROPERITONEAL LYMPHADENECTOMY;  Surgeon: Ricardo Likens, MD;  [facility];  Service: Urology;  Laterality: N/A;   NECK SURGERY  2002   ROBOT ASSISTED LAPAROSCOPIC NEPHRECTOMY Left 12/29/2015   Procedure: XI ROBOTIC ASSISTED LEFT LAPAROSCOPIC NEPHRECTOMY;  Surgeon: Ricardo Likens, MD;  Location: [facility];  Service: Urology;  Laterality: Left;   TONSILLECTOMY  1976   Past Medical History:  Diagnosis Date   Anxiety    Arthritis    Asthma    Bipolar 2 disorder (HCC)    pt stated, I have Bipolar 2 and it is remission   Bipolar affective (HCC)    CAD (coronary artery disease), native artery transplanted heart    Nonobstructive by coronary CTA 08/2021 with less than 25% stenosis in the LAD, RCA and left circumflex.   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   Full dentures    Gout    Headache(784.0)    migraines   Herpesviral infection    Hypertension    IBS (irritable bowel syndrome)    with diarrhea   Lymphadenopathy    Myalgia and myositis, unspecified    Nephrolithiasis 02/11/2013   PAT (paroxysmal atrial tachycardia) (HCC)    asymptomatic 10 beat run on event monitor 09/2020   PFO (patent foramen ovale)    Small PFO noted on coronary CTA   Pneumonia    PONV (postoperative nausea and vomiting)    PTSD (post-traumatic stress disorder)    Renal calculi    Restless legs syndrome (RLS)    Thoracic radiculopathy    Vitamin D  deficiency    Wears glasses    There were no vitals taken for this visit.  Opioid Risk Score:   Fall Risk Score:  `1  Depression screen [facility] 2/9     02/19/2024     9:57 AM 01/16/2024   11:12 AM 10/11/2023   11:49 AM 08/27/2023    1:54 PM 07/31/2023    2:20 PM 07/18/2023   11:09 AM 06/15/2023   10:38 AM  Depression screen PHQ 2/9  Decreased Interest 0 0 1 0 1 1 0  Down, Depressed, Hopeless 0 0 1 0 3 1 0  PHQ - 2 Score 0 0 2 0 4 2 0  Altered sleeping 1 0   0  0  Tired, decreased energy 0 0   0  1  Change in appetite 0 0   0  0  Feeling bad or failure about yourself  0 0  0 0  0  Trouble concentrating 0 0   0  0  Moving slowly or fidgety/restless 0 1   0  1  Suicidal thoughts 0 0   0  0  PHQ-9 Score 1 1   4  2   Difficult doing work/chores Somewhat difficult Not difficult at all   Somewhat difficult  Not difficult at all    Review of Systems     Objective:   Physical Exam        Assessment & Plan:

## 2024-04-18 ENCOUNTER — Telehealth: Payer: Self-pay | Admitting: Cardiology

## 2024-04-18 ENCOUNTER — Encounter: Attending: Physical Medicine & Rehabilitation | Admitting: Registered Nurse

## 2024-04-18 ENCOUNTER — Encounter: Payer: Self-pay | Admitting: Registered Nurse

## 2024-04-18 ENCOUNTER — Other Ambulatory Visit: Payer: Self-pay | Admitting: Cardiology

## 2024-04-18 VITALS — BP 117/62 | HR 81 | Ht 65.5 in | Wt 153.4 lb

## 2024-04-18 DIAGNOSIS — M47812 Spondylosis without myelopathy or radiculopathy, cervical region: Secondary | ICD-10-CM | POA: Diagnosis not present

## 2024-04-18 DIAGNOSIS — M5412 Radiculopathy, cervical region: Secondary | ICD-10-CM | POA: Insufficient documentation

## 2024-04-18 DIAGNOSIS — M5416 Radiculopathy, lumbar region: Secondary | ICD-10-CM | POA: Insufficient documentation

## 2024-04-18 DIAGNOSIS — G894 Chronic pain syndrome: Secondary | ICD-10-CM | POA: Insufficient documentation

## 2024-04-18 DIAGNOSIS — M542 Cervicalgia: Secondary | ICD-10-CM | POA: Diagnosis not present

## 2024-04-18 DIAGNOSIS — G8929 Other chronic pain: Secondary | ICD-10-CM | POA: Insufficient documentation

## 2024-04-18 DIAGNOSIS — M5459 Other low back pain: Secondary | ICD-10-CM | POA: Diagnosis not present

## 2024-04-18 DIAGNOSIS — M546 Pain in thoracic spine: Secondary | ICD-10-CM | POA: Diagnosis not present

## 2024-04-18 DIAGNOSIS — Z5181 Encounter for therapeutic drug level monitoring: Secondary | ICD-10-CM | POA: Insufficient documentation

## 2024-04-18 DIAGNOSIS — Z79891 Long term (current) use of opiate analgesic: Secondary | ICD-10-CM | POA: Insufficient documentation

## 2024-04-18 DIAGNOSIS — M797 Fibromyalgia: Secondary | ICD-10-CM | POA: Insufficient documentation

## 2024-04-18 DIAGNOSIS — M7918 Myalgia, other site: Secondary | ICD-10-CM | POA: Insufficient documentation

## 2024-04-18 MED ORDER — HYDROCODONE-ACETAMINOPHEN 5-325 MG PO TABS: 1.0000 | ORAL_TABLET | Freq: Three times a day (TID) | ORAL | 0 refills | Status: DC | PRN

## 2024-04-18 MED ORDER — CYCLOBENZAPRINE HCL 10 MG PO TABS: 10.0000 mg | ORAL_TABLET | Freq: Three times a day (TID) | ORAL | 4 refills | Status: DC

## 2024-04-18 NOTE — Telephone Encounter (Signed)
 Pt c/o medication issue:  1. Name of Medication:   Nitroglycerin   2. How are you currently taking this medication (dosage and times per day)?   Not taking  3. Are you having a reaction (difficulty breathing--STAT)?   4. What is your medication issue?   Patient wants to be prescribed Nitroglycerin 

## 2024-04-18 NOTE — Telephone Encounter (Signed)
 Spoke with patient (see other phone note from today), she states she feels the metoprolol  is working well for her.  Will forward BP readings to Dr. Shlomo to review.

## 2024-04-18 NOTE — Progress Notes (Signed)
 Subjective:    Patient ID: Patty Salas, female    DOB: Jul 15, 1956, 68 y.o.   MRN: [id number]  HPI: Patty Salas is a 68 y.o. female who called office requesting Virtual visit, she had migraine and was started on a new medication by her cardiologist. She reports blood pressure medication, she will F/U with Cardiology regarding the blood pressure. Patty Salas visit was changed to Virtual visit, she was able to conned on Virtual visit, visit changed to telephone visit.  I connected with  Patty Salas by telephone and verified that I am speaking with the correct person using two identifiers.  Location: Patient: In her Home Provider: In the Office    I discussed the limitations, risks, security and privacy concerns of performing an evaluation and management service by telephone and the availability of in person appointments. I also discussed with the patient that there may be a patient responsible charge related to this service. The patient expressed understanding and agreed to proceed.  She states her  pain is located in her neck radiating into her bilateral shoulders, upper- lower back radiating into her bilateral hips and bilateral lower extremities. Also reports bilateral hand and wrist pain. She rates her pain 7. Her current exercise regime is walking and performing stretching exercises.  Patty Salas Morphine equivalent is 15.38 MME.   Last UDS was Performed on 02/19/2024, it was consistent.    Pain Inventory Average Pain 6 Pain Right Now 7 My pain is constant, sharp, burning, dull, stabbing, tingling, and aching  In the last 24 hours, has pain interfered with the following? General activity 7 Relation with others 2 Enjoyment of life 5 What TIME of day is your pain at its worst? daytime and evening Sleep (in general) Fair  Pain is worse with: walking, bending, sitting, and standing Pain improves with: rest and medication Relief from Meds: 6  Family History  Problem Relation Age  of Onset   Cancer Mother        stomach   Migraines Mother    Arthritis Mother    Emphysema Mother    Heart disease Father    Hypertension Father    Cancer Father        colon   Dementia Father    Hypertension Brother    Migraines Brother    Hypertension Brother    Migraines Brother    Alcohol  abuse Brother    Bipolar disorder Brother    Migraines Daughter    Arthritis Maternal Grandmother    Cancer Maternal Grandmother        stomach   Diabetes Maternal Grandmother    Stroke Maternal Grandmother    Cancer Maternal Aunt        lung   Emphysema Maternal Aunt    Cancer Paternal Uncle        colon   Hypertension Maternal Aunt    Heart disease Maternal Aunt    Cancer Paternal Uncle        lung   Social History   Socioeconomic History   Marital status: Married    Spouse name: Not on file   Number of children: 1   Years of education: COLLEGE1   Highest education level: Some college, no degree  Occupational History   Occupation: HOUSEWIFE    Associate Professor: UNEMPLOYED  Tobacco Use   Smoking status: Never   Smokeless tobacco: Never  Vaping Use   Vaping status: Never Used  Substance and Sexual Activity   Alcohol   use: No   Drug use: No   Sexual activity: Not on file  Other Topics Concern   Not on file  Social History Narrative   Patient is right handed.   Patient drinks caffeine occasionally.   Social Drivers of Corporate investment banker Strain: Low Risk  (11/19/2023)   Overall Financial Resource Strain (CARDIA)    Difficulty of Paying Living Expenses: Not very hard  Food Insecurity: No Food Insecurity (12/20/2023)   Hunger Vital Sign    Worried About Running Out of Food in the Last Year: Never true    Ran Out of Food in the Last Year: Never true  Transportation Needs: Unmet Transportation Needs (12/20/2023)   PRAPARE - Transportation    Lack of Transportation (Medical): Yes    Lack of Transportation (Non-Medical): Yes  Physical Activity: Inactive (11/19/2023)    Exercise Vital Sign    Days of Exercise per Week: 0 days    Minutes of Exercise per Session: 0 min  Stress: No Stress Concern Present (11/19/2023)   [employer] of Occupational Health - Occupational Stress Questionnaire    Feeling of Stress : Only a little  Social Connections: Moderately Integrated (11/19/2023)   Social Connection and Isolation Panel    Frequency of Communication with Friends and Family: More than three times a week    Frequency of Social Gatherings with Friends and Family: Once a week    Attends Religious Services: More than 4 times per year    Active Member of [employer] or Organizations: No    Attends Engineer, structural: Never    Marital Status: Married   Past Surgical History:  Procedure Laterality Date    kidney stones     ABDOMINAL ADHESION SURGERY     ABDOMINAL HYSTERECTOMY  1995   partial- hemmoraged after surgery-stitch came loose   APPENDECTOMY  1993   hemmoraged after surgery- stitch came loose   CERVICAL DISC SURGERY  06/04/2001   with fusion   CHOLECYSTECTOMY  1985   colonoscopy     COLONOSCOPY     CYSTOSCOPY     FOOT SURGERY     lt   INCISIONAL HERNIA REPAIR N/A 04/22/2021   Procedure: LAPAROSCOPIC INCISIONAL HERNIA REPAIR WITH MESH;  Surgeon: Signe Mitzie LABOR, MD;  Location: [facility];  Service: General;  Laterality: N/A;   jj stent  07/2002   with ureteroscopy, cystoscopy and then removal of stent in office   LAPAROSCOPIC LYSIS OF ADHESIONS N/A 04/22/2021   Procedure: LYSIS OF ADHESIONS;  Surgeon: Signe Mitzie LABOR, MD;  Location: [facility];  Service: General;  Laterality: N/A;   LITHOTRIPSY     LYMPH NODE BIOPSY Right 03/06/2014   Procedure: right groin LYMPH NODE BIOPSY;  Surgeon: Deward GORMAN Curvin DOUGLAS, MD;  Location: [facility];  Service: General;  Laterality: Right;   LYMPHADENECTOMY N/A 12/29/2015   Procedure: RETROPERITONEAL LYMPHADENECTOMY;  Surgeon: Ricardo Likens, MD;  Location: [facility];  Service: Urology;  Laterality: N/A;    NECK SURGERY  2002   ROBOT ASSISTED LAPAROSCOPIC NEPHRECTOMY Left 12/29/2015   Procedure: XI ROBOTIC ASSISTED LEFT LAPAROSCOPIC NEPHRECTOMY;  Surgeon: Ricardo Likens, MD;  Location: [facility];  Service: Urology;  Laterality: Left;   TONSILLECTOMY  1976   Past Surgical History:  Procedure Laterality Date    kidney stones     ABDOMINAL ADHESION SURGERY     ABDOMINAL HYSTERECTOMY  1995   partial- hemmoraged after surgery-stitch came loose   APPENDECTOMY  1993  hemmoraged after surgery- stitch came loose   CERVICAL DISC SURGERY  06/04/2001   with fusion   CHOLECYSTECTOMY  1985   colonoscopy     COLONOSCOPY     CYSTOSCOPY     FOOT SURGERY     lt   INCISIONAL HERNIA REPAIR N/A 04/22/2021   Procedure: LAPAROSCOPIC INCISIONAL HERNIA REPAIR WITH MESH;  Surgeon: Signe Mitzie LABOR, MD;  Location: [facility];  Service: General;  Laterality: N/A;   jj stent  07/2002   with ureteroscopy, cystoscopy and then removal of stent in office   LAPAROSCOPIC LYSIS OF ADHESIONS N/A 04/22/2021   Procedure: LYSIS OF ADHESIONS;  Surgeon: Signe Mitzie LABOR, MD;  Location: [facility];  Service: General;  Laterality: N/A;   LITHOTRIPSY     LYMPH NODE BIOPSY Right 03/06/2014   Procedure: right groin LYMPH NODE BIOPSY;  Surgeon: Deward GORMAN Curvin DOUGLAS, MD;  Location: [facility];  Service: General;  Laterality: Right;   LYMPHADENECTOMY N/A 12/29/2015   Procedure: RETROPERITONEAL LYMPHADENECTOMY;  Surgeon: Ricardo Likens, MD;  Location: [facility];  Service: Urology;  Laterality: N/A;   NECK SURGERY  2002   ROBOT ASSISTED LAPAROSCOPIC NEPHRECTOMY Left 12/29/2015   Procedure: XI ROBOTIC ASSISTED LEFT LAPAROSCOPIC NEPHRECTOMY;  Surgeon: Ricardo Likens, MD;  Location: [facility];  Service: Urology;  Laterality: Left;   TONSILLECTOMY  1976   Past Medical History:  Diagnosis Date   Anxiety    Arthritis    Asthma    Bipolar 2 disorder (HCC)    pt stated, I have Bipolar 2 and it is remission   Bipolar affective (HCC)    CAD  (coronary artery disease), native artery transplanted heart    Nonobstructive by coronary CTA 08/2021 with less than 25% stenosis in the LAD, RCA and left circumflex.   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   Full dentures    Gout    Headache(784.0)    migraines   Herpesviral infection    Hypertension    IBS (irritable bowel syndrome)    with diarrhea   Lymphadenopathy    Myalgia and myositis, unspecified    Nephrolithiasis 02/11/2013   PAT (paroxysmal atrial tachycardia) (HCC)    asymptomatic 10 beat run on event monitor 09/2020   PFO (patent foramen ovale)    Small PFO noted on coronary CTA   Pneumonia    PONV (postoperative nausea and vomiting)    PTSD (post-traumatic stress disorder)    Renal calculi    Restless legs syndrome (RLS)    Thoracic radiculopathy    Vitamin D  deficiency    Wears glasses    BP 117/62 Comment: patient reported  Pulse 81   Ht 5' 5.5 (1.664 m)   Wt 153 lb 6.4 oz (69.6 kg)   BMI 25.14 kg/m   Opioid Risk Score:   Fall Risk Score:  `1  Depression screen [facility] 2/9     04/18/2024   12:22 PM 02/19/2024    9:57 AM 01/16/2024   11:12 AM 10/11/2023   11:49 AM 08/27/2023    1:54 PM 07/31/2023    2:20 PM 07/18/2023   11:09 AM  Depression screen PHQ 2/9  Decreased Interest 0 0 0  1 0 1 1  Down, Depressed, Hopeless 0 0 0 1 0 3 1  PHQ - 2 Score 0 0 0 2 0 4 2  Altered sleeping 0 1 0   0   Tired, decreased energy 0 0 0   0   Change in appetite 0 0 0   0   Feeling bad or failure about yourself  0 0 0  0 0   Trouble concentrating 0 0 0   0   Moving slowly or fidgety/restless 0 0 1   0   Suicidal thoughts 0 0 0   0   PHQ-9 Score 0 1 1   4    Difficult doing work/chores Somewhat difficult Somewhat difficult Not  difficult at all   Somewhat difficult       Review of Systems  Gastrointestinal:        IBS  Musculoskeletal:  Positive for arthralgias, myalgias and neck pain.       Bilateral hand pain, myofascial pain  Neurological:  Positive for headaches.  All other systems reviewed and are negative.      Objective:   Physical Exam Vitals and nursing note reviewed.  Musculoskeletal:     Comments: No Physical Exam Performed: Telephone visit          Assessment & Plan:  History of fibromyalgia with myofascial pain and multiple trigger points.Continue with Heat and exercise Regime.   Continue with current medication regimen with  gabapentin . 04/18/2024 2 Chronic migraine headaches..Continue current medication regimen with Almotriptan , she has a scheduled appointment wth Dr Skeet on 04/02/2024 Continue to Monitor. S/P Botox.on 05/30/2017. 04/18/2024 3. Midline Low Back Pain/ Lumbar Spondylosis/Lumbar degenerative disk disease, L4-5/ Lumbar Radiculitis: Continue with HEP and current medication regimen. Continue Hydrocodone  5/325 mg one tablet every 8 hours as needed #80. SABRAContinue with slow weaning of Hydrocodone .  04/18/2024 4. 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 Urology Following. 04/18/2024. 5. 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 04/18/2024 6. Cervicalgia/ Cervical RadiculitisCervical Spondylosis:  Continue current medication regiment with  Gabapentin . Continue  with HEP and Continue to monitor. 04/18/2024 7. Muscle Spasm: Myofascial Pain: She is svcheduled for Trigger Point Injection with Dr Babs : Continue current medication regimen with Flexeril  as needed. 04/18/2024 8. Bilateral Greater Trochanter Bursitis:  Continue to alternate with ice and heat therapy. Continue HEP as Tolerated. Continue to monitor. 04/18/2024 9. Bilateral Knee Pain: S/P Knee Injection by Dr Harden on 02/23/2020. Orthopedics Following. Continue to Monitor. 04/18/2024  10. Left  Groin Pain: No complaints today.Patty Salas reports Oncology Following. Continue to Monitor. 04/18/2024.  11. Fall at Home: No Falls this month. Educated on falls prevention, she verbalizes understanding. Continue to Monitor. 04/18/2024  12. Everitt Hahn Tenosynovitis: Ortho Following. . We will Continue to Monitor. 04/18/2024   F/U in 1 month   Telephone Visit Established Patient Location of patient: In her Home Location of Provider: In the office

## 2024-04-18 NOTE — Telephone Encounter (Signed)
 Patient called to report her BP reading she stated as requested by Dr. Shlomo.  7/10 - 146/92 morning 7/10 - 143/84  HR 102 evening (before Toprol ) 7/11 - 133/84 morning 7/11 - 142/76  HR 107 evening 7/12 - 138/72  morning 7/12 - 147/82  HR 106 evening 7/13 - 132/73 morning 7/13 - 140/78  HR 102 evening 7/14 - 130/72  morning 7/14 - 138/78  HR 97 evening 7/15 - 128/71 morning 7/15 - 130/72 HR 88 evening 7/16 - 120/72 morning 7/16 - 126/64  HR 72 evening 7/17 - 113/63 morning 7/17 - 124/67  HR 69 evening 7/18 - 117/62 HR 81 morning  Patient stated she has been doing better and wants to stay on the Toprol .

## 2024-04-18 NOTE — Telephone Encounter (Signed)
 Patient states she would like to have NTG SL to take PRN for chest pain. She denies having any chest pain recently, but would like to have on hand just in case.  Will forward to Dr. Shlomo to see if she is OK with prescribing PRN NTG for patient.  Patient requests Rx be sent to [facility] on [address].

## 2024-04-21 MED ORDER — NITROGLYCERIN 0.4 MG SL SUBL: 0.4000 mg | SUBLINGUAL_TABLET | SUBLINGUAL | 6 refills | Status: AC | PRN

## 2024-04-21 NOTE — Telephone Encounter (Signed)
 Patty Wilbert SAUNDERS, MD to [facility] Triage  Janit Geni CROME, RN     04/20/24  8:36 PM HR AND BP look good after starting Toprol   Patient notified.  She will check blood pressure a couple of times per week.

## 2024-04-21 NOTE — Telephone Encounter (Signed)
 Reviewed with PharmD and OK to send in.  Prescription sent to [facility].  Patient aware

## 2024-04-21 NOTE — Telephone Encounter (Signed)
 Patty Wilbert SAUNDERS, MD to [facility] Triage  Patty Geni CROME, RN     04/20/24  8:37 PM OK to order SL NTG  Will send to pharmacist to see if OK to fill NTG.  Patient has allergy/contraindication due to quetiapine fumarate.

## 2024-04-21 NOTE — Telephone Encounter (Signed)
 Patient reports she has taken NTG in the past without problems

## 2024-04-30 ENCOUNTER — Other Ambulatory Visit: Payer: Self-pay | Admitting: Family Medicine

## 2024-04-30 DIAGNOSIS — K589 Irritable bowel syndrome without diarrhea: Secondary | ICD-10-CM

## 2024-05-01 ENCOUNTER — Other Ambulatory Visit: Payer: Self-pay | Admitting: Family Medicine

## 2024-05-01 DIAGNOSIS — K589 Irritable bowel syndrome without diarrhea: Secondary | ICD-10-CM

## 2024-05-02 ENCOUNTER — Telehealth: Payer: Self-pay

## 2024-05-02 NOTE — Telephone Encounter (Signed)
 Copied from CRM [id number]. Topic: Clinical - Prescription Issue >> May 02, 2024  8:06 AM Everette C wrote: Reason for CRM: The patient has called to request additional contact with a member of staff regarding their previously requested refill of promethazine  (PHENERGAN ) 25 MG tablet [[id number]]. Please contact the patient further if/when possible

## 2024-05-05 NOTE — Telephone Encounter (Signed)
 Spoke with patient via telephone to address concerns. Patient said that she just needed a refill on the medication and she was able to request it via her [facility] app on Friday. She did not have any other concerns.

## 2024-05-13 ENCOUNTER — Ambulatory Visit: Payer: Medicare Other | Admitting: Neurology

## 2024-05-19 ENCOUNTER — Ambulatory Visit: Payer: Self-pay

## 2024-05-19 NOTE — Telephone Encounter (Signed)
 FYI Only or Action Required?: Action required by provider: update on patient condition.  Patient was last seen in primary care on 01/16/2024 by Chandra Toribio POUR, MD.  Called Nurse Triage reporting Abdominal Pain and Diarrhea.  Symptoms began chronic.  Interventions attempted: Prescription medications: hyoscyamine .  Symptoms are: gradually worsening.  Triage Disposition: Go to ED Now (Notify PCP)  Patient/caregiver understands and will follow disposition?: No, refuses disposition   Copied from CRM [id number]. Topic: Clinical - Red Word Triage >> May 19, 2024  4:33 PM Selinda RAMAN wrote: Red Word that prompted transfer to Nurse Triage: The patient called in stating she has taken the hyoscyamine  (ANASPAZ ) 0.125 MG TBDP disintergrating tablet for severe abdominal pain and diarrhea. Since she is hurting so bad I will transfer her to E2C2 NT Reason for Disposition  [1] SEVERE pain (e.g., excruciating) AND [2] present > 1 hour  Answer Assessment - Initial Assessment Questions 1. LOCATION: Where does it hurt?      Abdomen 2. RADIATION: Does the pain shoot anywhere else? (e.g., chest, back)     Denies 3. ONSET: When did the pain begin? (e.g., minutes, hours or days ago)       4. SUDDEN: Gradual or sudden onset?     Chronic-having flair currently 5. PATTERN Does the pain come and go, or is it constant?     Constant  6. SEVERITY: How bad is the pain?  (e.g., Scale 1-10; mild, moderate, or severe)     severe 7. RECURRENT SYMPTOM: Have you ever had this type of stomach pain before? If Yes, ask: When was the last time? and What happened that time?      yes 8. CAUSE: What do you think is causing the stomach pain? (e.g., gallstones, recent abdominal surgery)     Diverticulitis 9. RELIEVING/AGGRAVATING FACTORS: What makes it better or worse? (e.g., antacids, bending or twisting motion, bowel movement)     Meditation, curling up in ball and relaxing 10. OTHER SYMPTOMS: Do you  have any other symptoms? (e.g., back pain, diarrhea, fever, urination pain, vomiting)       Diarrhea. Left wrist pain near thumb.   Additional info: Hyoscyamine  usually helps but not at this time, she just took it 30 minutes ago, it takes about 1-2 hours to work. She is not interested in going to hospital at this time, she would like to give medication time to work and will proceed to ER if no improvement.  Protocols used: Abdominal Pain - Female-A-AH

## 2024-05-20 ENCOUNTER — Ambulatory Visit: Payer: Self-pay

## 2024-05-20 ENCOUNTER — Other Ambulatory Visit: Payer: Self-pay | Admitting: Family Medicine

## 2024-05-20 ENCOUNTER — Encounter: Payer: Self-pay | Admitting: Family Medicine

## 2024-05-20 ENCOUNTER — Ambulatory Visit: Admitting: Family Medicine

## 2024-05-20 NOTE — Telephone Encounter (Signed)
 FYI Only or Action Required?: Action required by provider: medication refill request. Refill on Promethazine   Patient was last seen in primary care on 01/16/2024 by Patty Toribio POUR, MD.  Called Nurse Triage reporting Abdominal Cramping.  Symptoms began yesterday.  Interventions attempted: OTC medications: imodium and Prescription medications: hycosamine and promethazine .  Symptoms are: unchanged.  Triage Disposition: See HCP Within 4 Hours (Or PCP Triage)  Patient/caregiver understands and will follow disposition?: Yes, but will wait Patient scheduled for appt today at 10:50 wih PCP, unable to make appt due to diarrhea, headache, and overall not feeling well. Pt asking to r/s OR convert appt to a virtual visit. This RN and previous RN is recommending ED based on symptoms, pt still declines. Will send message HP to office for follow-up Copied from CRM #[id number]. Topic: Clinical - Red Word Triage >> May 20, 2024  8:07 AM Patty Salas wrote: Red Word that prompted transfer to Nurse Triage: pain in the stomach with stomach cramps Reason for Disposition  [1] MILD-MODERATE pain AND [2] constant AND [3] age > 60 years  Answer Assessment - Initial Assessment Questions 1. LOCATION: Where does it hurt?      All over  2. RADIATION: Does the pain shoot anywhere else? (e.g., chest, back)     Does not radiate  3. ONSET: When did the pain begin? (e.g., minutes, hours or days ago)      Started yesterday  4. SUDDEN: Gradual or sudden onset?     Sudden onset  5. PATTERN Does the pain come and go, or is it constant?     Constant  6. SEVERITY: How bad is the pain?  (e.g., Scale 1-10; mild, moderate, or severe)     Moderate  7. RECURRENT SYMPTOM: Have you ever had this type of stomach pain before? If Yes, ask: When was the last time? and What happened that time?      Yes, history of diverticulosis and IBS  8. CAUSE: What do you think is causing the stomach pain? (e.g.,  gallstones, recent abdominal surgery)     Unsure of exact trigger  9. RELIEVING/AGGRAVATING FACTORS: What makes it better or worse? (e.g., antacids, bending or twisting motion, bowel movement)     No  10. OTHER SYMPTOMS: Do you have any other symptoms? (e.g., back pain, diarrhea, fever, urination pain, vomiting)       Diarrhea, nausea, dizziness, internal and external hemorrhoids  11. PREGNANCY: Is there any chance you are pregnant? When was your last menstrual period?       no  Protocols used: Abdominal Pain - [facility]

## 2024-05-20 NOTE — Telephone Encounter (Signed)
 Patient called unable to make 10:50am appointment today. Called [facility] to let them know. Patient requesting status on refill of Promethazine , would like a call back for update on that and rescheduling.

## 2024-05-22 ENCOUNTER — Telehealth: Payer: Self-pay

## 2024-05-22 NOTE — Telephone Encounter (Signed)
 Copied from CRM [id number]. Topic: General - Other >> May 22, 2024 12:08 PM Emylou G wrote: Reason for CRM: Patient wanted to adv she is feeling better, didn't have to go to hospital.. She is thankful we got her meds filled.. She is interested in getting a 3D mammogram?  Pls f/u with patient. Would like to make an appt to go over meds etc.  System wouldn't allow me to make it.

## 2024-05-22 NOTE — Telephone Encounter (Addendum)
 LVM for pt to return call to schedule in person appt to discuss her concerns. Please schedule IN OFFICE 40 MIN ONLY appt  for the FIRST AVAILABLE. Attendance is required as pt has had 6 NO SHOWS since OCT 2024 and the policy is ONLY 3.

## 2024-05-23 ENCOUNTER — Other Ambulatory Visit: Payer: Self-pay | Admitting: Family Medicine

## 2024-05-23 DIAGNOSIS — R062 Wheezing: Secondary | ICD-10-CM

## 2024-05-23 MED ORDER — ALBUTEROL SULFATE HFA 108 (90 BASE) MCG/ACT IN AERS: INHALATION_SPRAY | RESPIRATORY_TRACT | 5 refills | Status: AC

## 2024-05-23 MED ORDER — EPINEPHRINE 0.3 MG/0.3ML IJ SOAJ: 0.3000 mg | INTRAMUSCULAR | 2 refills | Status: AC | PRN

## 2024-05-23 NOTE — Telephone Encounter (Signed)
 Copied from CRM [id number]. Topic: Clinical - Medication Refill >> May 23, 2024  8:35 AM Carlatta H wrote: Medication: VENTOLIN  HFA 108 (90 Base) MCG/ACT inhaler EPINEPHrine  0.3 mg/0.3 mL IJ SOAJ injection   Has the patient contacted their pharmacy? No (Agent: If no, request that the patient contact the pharmacy for the refill. If patient does not wish to contact the pharmacy document the reason why and proceed with request.) (Agent: If yes, when and what did the pharmacy advise?)  This is the patient's preferred pharmacy:  [facility] [address], [place] - [address] [place] [address] Phone: [phone] Fax: [phone]   Is this the correct pharmacy for this prescription? Yes If no, delete pharmacy and type the correct one.   Has the prescription been filled recently? No  Is the patient out of the medication? No  Has the patient been seen for an appointment in the last year OR does the patient have an upcoming appointment? Yes  Can we respond through MyChart? Yes  Agent: Please be advised that Rx refills may take up to 3 business days. We ask that you follow-up with your pharmacy.

## 2024-05-28 ENCOUNTER — Telehealth: Payer: Self-pay | Admitting: *Deleted

## 2024-05-28 NOTE — Telephone Encounter (Signed)
 Pt called in reference to the letter she received about her no shows.  Informed her that she really needs to keep her appointments and that we do understand emergencies that come up every now and then but consistent no shows per the policy could be the cause of her dismissal.

## 2024-05-29 ENCOUNTER — Ambulatory Visit: Admitting: Pharmacist

## 2024-06-06 ENCOUNTER — Ambulatory Visit: Admitting: Neurology

## 2024-06-11 ENCOUNTER — Other Ambulatory Visit: Payer: Self-pay | Admitting: Family Medicine

## 2024-06-16 ENCOUNTER — Telehealth: Payer: Self-pay | Admitting: Cardiology

## 2024-06-16 NOTE — Telephone Encounter (Signed)
 Pt c/o medication issue:  1. Name of Medication:   metoprolol  succinate (TOPROL  XL) 25 MG 24 hr tablet   2. How are you currently taking this medication (dosage and times per day)?   As prescribed  3. Are you having a reaction (difficulty breathing--STAT)?   4. What is your medication issue?   Patient stated she has been having tightness/severe cramping in her muscles and already has hand/wrist issues.  Patient wants to know if she can take COQ10.

## 2024-06-16 NOTE — Telephone Encounter (Signed)
 Returned patient call regarding concerns about side effects from metoprolol  which she started taking in July 2025.   Patient reports increased joint and muscle aches as well as worsening IBS symptoms since she started metoprolol . She states these are chronic issues for her, but she is concerned the metoprolol  is causing an exacerbation of these problems. She reports her BP is 133/84  and HR is 107 (prior to taking any meds). Patient requests feedback from Dr. Walterine D about whether metoprolol  could be worsening her de Quervain's tenosynovitis and IBS and if she should consider a different medication.

## 2024-06-19 ENCOUNTER — Encounter (HOSPITAL_COMMUNITY): Payer: Self-pay

## 2024-06-19 ENCOUNTER — Ambulatory Visit: Admitting: Registered Nurse

## 2024-06-19 ENCOUNTER — Telehealth: Payer: Self-pay

## 2024-06-19 ENCOUNTER — Emergency Department (HOSPITAL_COMMUNITY)
Admission: EM | Admit: 2024-06-19 | Discharge: 2024-06-19 | Disposition: A | Discharge status: Home / Self Care | Attending: Emergency Medicine | Admitting: Emergency Medicine

## 2024-06-19 ENCOUNTER — Emergency Department (HOSPITAL_COMMUNITY)

## 2024-06-19 ENCOUNTER — Other Ambulatory Visit: Payer: Self-pay

## 2024-06-19 DIAGNOSIS — Z85528 Personal history of other malignant neoplasm of kidney: Secondary | ICD-10-CM | POA: Diagnosis not present

## 2024-06-19 DIAGNOSIS — Z91040 Latex allergy status: Secondary | ICD-10-CM | POA: Insufficient documentation

## 2024-06-19 DIAGNOSIS — R0602 Shortness of breath: Secondary | ICD-10-CM | POA: Insufficient documentation

## 2024-06-19 DIAGNOSIS — R61 Generalized hyperhidrosis: Secondary | ICD-10-CM | POA: Diagnosis not present

## 2024-06-19 DIAGNOSIS — R079 Chest pain, unspecified: Secondary | ICD-10-CM | POA: Insufficient documentation

## 2024-06-19 DIAGNOSIS — R002 Palpitations: Secondary | ICD-10-CM | POA: Diagnosis not present

## 2024-06-19 LAB — TROPONIN I (HIGH SENSITIVITY)
Troponin I (High Sensitivity): 9 ng/L (ref <18)
Troponin I (High Sensitivity): 9 ng/L (ref <18)

## 2024-06-19 LAB — CBC WITH DIFFERENTIAL/PLATELET
Abs Immature Granulocytes: 0.07 K/uL (ref 0.00–0.07)
Basophils Absolute: 0.1 K/uL (ref 0.0–0.1)
Basophils Relative: 1 %
Eosinophils Absolute: 0.1 K/uL (ref 0.0–0.5)
Eosinophils Relative: 2 %
HCT: 38.7 % (ref 36.0–46.0)
Hemoglobin: 12.5 g/dL (ref 12.0–15.0)
Immature Granulocytes: 1 %
Lymphocytes Relative: 16 %
Lymphs Abs: 1.1 K/uL (ref 0.7–4.0)
MCH: 31.9 pg (ref 26.0–34.0)
MCHC: 32.3 g/dL (ref 30.0–36.0)
MCV: 98.7 fL (ref 80.0–100.0)
Monocytes Absolute: 0.7 K/uL (ref 0.1–1.0)
Monocytes Relative: 11 %
Neutro Abs: 4.9 K/uL (ref 1.7–7.7)
Neutrophils Relative %: 69 %
Platelets: 276 K/uL (ref 150–400)
RBC: 3.92 MIL/uL (ref 3.87–5.11)
RDW: 12.7 % (ref 11.5–15.5)
WBC: 7 K/uL (ref 4.0–10.5)
nRBC: 0 % (ref 0.0–0.2)

## 2024-06-19 LAB — I-STAT CHEM 8, ED
BUN: 15 mg/dL (ref 8–23)
Calcium, Ion: 1.11 mmol/L — ABNORMAL LOW (ref 1.15–1.40)
Chloride: 98 mmol/L (ref 98–111)
Creatinine, Ser: 1 mg/dL (ref 0.44–1.00)
Glucose, Bld: 94 mg/dL (ref 70–99)
HCT: 40 % (ref 36.0–46.0)
Hemoglobin: 13.6 g/dL (ref 12.0–15.0)
Potassium: 4 mmol/L (ref 3.5–5.1)
Sodium: 139 mmol/L (ref 135–145)
TCO2: 30 mmol/L (ref 22–32)

## 2024-06-19 LAB — BASIC METABOLIC PANEL WITH GFR
Anion gap: 14 (ref 5–15)
BUN: 13 mg/dL (ref 8–23)
CO2: 29 mmol/L (ref 22–32)
Calcium: 9.6 mg/dL (ref 8.9–10.3)
Chloride: 97 mmol/L — ABNORMAL LOW (ref 98–111)
Creatinine, Ser: 0.91 mg/dL (ref 0.44–1.00)
GFR, Estimated: >60 mL/min (ref >60)
Glucose, Bld: 94 mg/dL (ref 70–99)
Potassium: 4.2 mmol/L (ref 3.5–5.1)
Sodium: 140 mmol/L (ref 135–145)

## 2024-06-19 LAB — HEPATIC FUNCTION PANEL
ALT: 40 U/L (ref 0–44)
AST: 98 U/L — ABNORMAL HIGH (ref 15–41)
Albumin: 4.2 g/dL (ref 3.5–5.0)
Alkaline Phosphatase: 79 U/L (ref 38–126)
Bilirubin, Direct: 0.5 mg/dL — ABNORMAL HIGH (ref 0.0–0.2)
Indirect Bilirubin: 0.9 mg/dL (ref 0.3–0.9)
Total Bilirubin: 1.4 mg/dL — ABNORMAL HIGH (ref 0.0–1.2)
Total Protein: 8.2 g/dL — ABNORMAL HIGH (ref 6.5–8.1)

## 2024-06-19 LAB — LIPASE, BLOOD: Lipase: 32 U/L (ref 11–51)

## 2024-06-19 MED ORDER — ACETAMINOPHEN 500 MG PO TABS
1000.0000 mg | ORAL_TABLET | Freq: Once | ORAL | Status: AC
  Administered 2024-06-19: 1000 mg via ORAL
  Filled 2024-06-19: qty 2

## 2024-06-19 MED ORDER — PROMETHAZINE HCL 25 MG PO TABS
25.0000 mg | ORAL_TABLET | Freq: Once | ORAL | Status: AC
  Administered 2024-06-19: 25 mg via ORAL
  Filled 2024-06-19: qty 1

## 2024-06-19 NOTE — ED Triage Notes (Signed)
 Reports palpitations sob since 630 am.  BIB ems from home.  IBS started at 440am this morning and then the shortness of breath and palpitations.    BP  142/92 HR 90  98%

## 2024-06-19 NOTE — ED Provider Notes (Signed)
 [place] EMERGENCY DEPARTMENT AT [facility] Provider Note   CSN: [id number] Arrival date & time: 06/19/24  9057     Patient presents with: Chest Pain   Patty Salas is a 68 y.o. female.   HPI     Patient comes in with chief complaint of chest pain.  Patient has history of fibromyalgia, chronic pain syndrome, renal cell carcinoma and nonobstructive carotid artery disease and IBS.  Patient reports that she was having some palpitations earlier today along with chest pain, sweating.  She also felt short of breath.  She did not take any medications for it.  Her chest pain is constant, left-sided, nonradiating.  Patient has had similar pain in the past.  She has seen a cardiologist and also indicates that in the past she had a CT PE which was negative.  Pt has no hx of PE, DVT and denies any exogenous hormone (testosterone / estrogen) use, long distance travels or surgery in the past 6 weeks, active cancer, recent immobilization.  Currently patient is cancer free.  Patient continues to have chest pain here.  Episodically, the pain will intensify and patient feels like she has to throw up.  She states that she has a pain contract with the pain doctor and is not sure if she should get anything for pain in the ER.  Prior to Admission medications   Medication Sig Start Date End Date Taking? Authorizing Provider  acetaminophen  (TYLENOL ) 500 MG tablet Take 500 mg by mouth every 6 (six) hours as needed for moderate pain.    [provider]  albuterol  (VENTOLIN  HFA) 108 (90 Base) MCG/ACT inhaler INHALE 2 PUFFS INTO THE LUNGS EVERY 6 HOURS AS NEEDED FOR WHEEZE 05/23/24   Chandra Toribio POUR, MD  almotriptan  (AXERT ) 12.5 MG tablet Take 1 tablet (12.5 mg total) by mouth as needed for migraine. may repeat in 2 hours if needed 12/17/23   Debby Fidela CROME, NP  bisacodyl  (DULCOLAX) 5 MG EC tablet Take 10 mg by mouth daily as needed for mild constipation.     [provider]   cyclobenzaprine  (FLEXERIL ) 10 MG tablet Take 1 tablet (10 mg total) by mouth 3 (three) times daily. 04/18/24   Debby Fidela CROME, NP  docusate sodium  (COLACE) 100 MG capsule Take 100 mg by mouth daily as needed for mild constipation.     [provider]  EPINEPHrine  0.3 mg/0.3 mL IJ SOAJ injection Inject 0.3 mg into the muscle as needed for anaphylaxis. 05/23/24   Chandra Toribio POUR, MD  furosemide  (LASIX ) 20 MG tablet Take 1 tablet (20 mg total) by mouth daily. May also take 1 tablet (20 mg total) as needed for edema. 04/10/24   Shlomo Wilbert SAUNDERS, MD  gabapentin  (NEURONTIN ) 300 MG capsule Take 2 capsules by mouth at bedtime.    [provider]  HYDROcodone -acetaminophen  (NORCO/VICODIN) 5-325 MG tablet Take 1 tablet by mouth every 8 (eight) hours as needed for moderate pain (pain score 4-6). 04/18/24   Debby Fidela CROME, NP  hyoscyamine  (ANASPAZ ) 0.125 MG TBDP disintergrating tablet DISSOLVE 1 TABLET(0.125 MG) UNDER THE TONGUE EVERY 4 HOURS FOR UP TO 10 DAYS AS NEEDED 05/01/24   Clapp, Kara F, PA-C  Loperamide HCl (IMODIUM A-D PO)     [provider]  LORazepam  (ATIVAN ) 1 MG tablet Take 1 tablet by mouth 3 (three) times daily.    [provider]  metoprolol  succinate (TOPROL  XL) 25 MG 24 hr tablet Take 1 tablet (25 mg  total) by mouth daily. 04/10/24   Shlomo Wilbert SAUNDERS, MD  nitroGLYCERIN  (NITROSTAT ) 0.4 MG SL tablet Place 1 tablet (0.4 mg total) under the tongue every 5 (five) minutes as needed for chest pain. 04/21/24   Shlomo Wilbert SAUNDERS, MD  promethazine  (PHENERGAN ) 25 MG tablet TAKE 1/2 TABLET BY MOUTH EVERY 6 HOURS AS NEEDED FOR NAUSEA OR VOMITING 06/11/24   Chandra Toribio POUR, MD  Trospium  Chloride 60 MG CP24 Take 1 capsule by mouth every morning.  03/28/12   Nieves Cough, MD    Allergies: Aripiprazole, Aspirin, Bromfenac sodium, Chlorpromazine, Cymbalta [duloxetine hcl], Divalproex sodium, Duloxetine, Duract [bromfenac], Flurazepam, Hydrochlorothiazide, Imitrex [sumatriptan  succinate], Latex, Mellaril, Metoclopramide, Olanzapine, Olanzapine, Other, Penicillins, Quetiapine, Statins, Sumatriptan, Thioridazine, Topamax, Topiramate, Trifluoperazine, Aripiprazole, Dalmane [flurazepam hcl], Darifenacin hydrobromide er, Metoclopramide hcl, Seroquel [quetiapine fumerate], Stelazine, Thorazine [chlorpromazine hcl], Allopurinol, Aspirin effervescent, Barium sulfate, Benzocaine, Biofreeze [menthol (topical analgesic)], Camphor-menthol, Capsaicin, Capsaicin-menthol, Clindamycin , Clindamycin /lincomycin, Clove oil, Colchicine, Cycloheximide, Darifenacin, Diatrizoate, Diflucan [fluconazole], Erythromycin, Garlic, Iohexol , Ivp dye [iodinated contrast media], Metronidazole, Mirabegron, Miralax [polyethylene glycol], Moxifloxacin, Nsaids, Oxybutynin, Polyethylene glycol 3350, Potassium chloride, Potassium-containing compounds, Prednisone , Psyllium, Red dye #40 (allura red), Seroquel [quetiapine fumarate], Simvastatin, Urocit - k [potassium citrate], Avelox [moxifloxacin hcl in nacl], Barium-containing compounds, Butalbital-aspirin-caffeine, Diclofenac , Doxycycline, E-mycin [erythromycin base], Flagyl [metronidazole hcl], Lamotrigine, Pregabalin, Pregabalin, Risperidone, Risperidone, and Toradol [ketorolac tromethamine]    Review of Systems  All other systems reviewed and are negative.   Updated Vital Signs BP (!) 127/97 (BP Location: Right Arm)   Pulse 95   Temp 99.6 F (37.6 C) (Oral)   Resp 18   Ht 5' 5.5 (1.664 m)   Wt 69.4 kg   SpO2 95%   BMI 25.07 kg/m   Physical Exam Vitals and nursing note reviewed.  Constitutional:      Appearance: She is well-developed.  HENT:     Head: Atraumatic.  Cardiovascular:     Rate and Rhythm: Normal rate.     Heart sounds: Normal heart sounds.     Comments: Reproducible chest wall tenderness over the left lower side, below the breast Pulmonary:     Effort: Pulmonary effort is normal.  Musculoskeletal:     Cervical back: Normal range of  motion and neck supple.  Skin:    General: Skin is warm and dry.  Neurological:     Mental Status: She is alert and oriented to person, place, and time.     (all labs ordered are listed, but only abnormal results are displayed) Labs Reviewed  BASIC METABOLIC PANEL WITH GFR - Abnormal; Notable for the following components:      Result Value   Chloride 97 (*)    All other components within normal limits  HEPATIC FUNCTION PANEL - Abnormal; Notable for the following components:   Total Protein 8.2 (*)    AST 98 (*)    Total Bilirubin 1.4 (*)    Bilirubin, Direct 0.5 (*)    All other components within normal limits  I-STAT CHEM 8, ED - Abnormal; Notable for the following components:   Calcium, Ion 1.11 (*)    All other components within normal limits  CBC WITH DIFFERENTIAL/PLATELET  LIPASE, BLOOD  TROPONIN I (HIGH SENSITIVITY)  TROPONIN I (HIGH SENSITIVITY)    EKG: EKG Interpretation Date/Time:  Thursday June 19 2024 21:50:58 EDT Ventricular Rate:  100 PR Interval:  158 QRS Duration:  88 QT Interval:  372 QTC Calculation: 480 R Axis:   45  Text Interpretation: Sinus tachycardia Abnormal inferior Q  waves Consider anterior infarct No acute changes Confirmed by Charlyn Sora [id number]) on 06/19/2024 9:52:14 PM  Radiology: [facility] Chest 2 View Result Date: 06/19/2024 CLINICAL DATA:  68 year old female with chest pain and palpitations, shortness of breath 0630 hours. EXAM: CHEST - 2 VIEW COMPARISON:  Portable chest 08/20/2023 and earlier. FINDINGS: PA and lateral views 1046 hours. Large lung volumes. Chronic dextro thoracic scoliosis. Normal cardiac size and mediastinal contours. Visualized tracheal air column is within normal limits. Stable lung markings. No pneumothorax, pulmonary edema, pleural effusion or acute lung opacity. Cervical ACDF. No acute osseous abnormality identified. Stable cholecystectomy clips. Paucity of visible bowel gas. IMPRESSION: Chronic pulmonary  hyperinflation suspected. No acute cardiopulmonary abnormality. Electronically Signed   By: VEAR Hurst M.D.   On: 06/19/2024 11:20     Procedures   Medications Ordered in the ED  promethazine  (PHENERGAN ) tablet 25 mg (has no administration in time range)  acetaminophen  (TYLENOL ) tablet 1,000 mg (has no administration in time range)                                    Medical Decision Making Amount and/or Complexity of Data Reviewed Labs: ordered. Radiology: ordered.  Risk OTC drugs. Prescription drug management.   This patient presents to the ED with chief complaint(s) of chest pain with pertinent past medical history of nonobstructive CAD.The complaint involves an extensive differential diagnosis and also carries with it a high risk of complications and morbidity.    The differential diagnosis considered for this patient includes  ACS syndrome Aortic dissection CHF exacerbation Valvular disorder Myocarditis Pericarditis Endocarditis Pericardial effusion / tamponade Pneumonia Pleural effusion / Pulmonary edema PE Pneumothorax Musculoskeletal pain PUD / Gastritis / Esophagitis Esophageal spasm  Patient was seen in the triage earlier.  She had delta troponin which are normal.  EKG does not show any evidence of ACS.  Patient's pain is constant, atypical and reproducible.  I have overall low suspicion for PE.  No hypoxia noted, O2 sats 100%.  She is mildly tachycardic, but also states that she has not taken anything for pain.  Some of her symptoms could be pain medication withdrawals as well.   Additional history obtained: Records reviewed Primary Care Documents and previous CT PE, coronary CT, cardiology note  Independent labs interpretation:  The following labs were independently interpreted: Troponin x 2 normal.  CBC, renal profile normal.  Independent visualization and interpretation of imaging: - I independently visualized the following imaging with scope of  interpretation limited to determining acute life threatening conditions related to emergency care: X-ray of the chest, which revealed no evidence of pneumothorax  Treatment and Reassessment: The patient appears reasonably screened and/or stabilized for discharge and I doubt any other medical condition or other [facility] requiring further screening, evaluation, or treatment in the ED at this time prior to discharge.   Results from the ER workup discussed with the patient face to face and all questions answered to the best of my ability. The patient is safe for discharge with strict return precautions.   Final diagnoses:  Nonspecific chest pain    ED Discharge Orders     None          Charlyn Sora, MD 06/19/24 2313

## 2024-06-19 NOTE — ED Notes (Signed)
 PT was seen with tissue with blood on it and stated that it was coming from her throat.Physician was notified.

## 2024-06-19 NOTE — Telephone Encounter (Signed)
 Call to patient to discuss medication changes, patient advises she is currently in ED for chest pain. Advised that someone from our team would be rounding if she is admitted. If she is not admitted asked her to call us  to discuss follow up. Patient verbalizes understanding.

## 2024-06-19 NOTE — Telephone Encounter (Signed)
 Patient called to cancel appt today and stated she only has #2 of Norco left. She left a message stating that she don't if she is having a heart attack but she has clammy skin, cold sweats, palpitations, nausea and diarrhea. She is going to call 911.  I called patient to check to see if she went to the ED. No answer.

## 2024-06-19 NOTE — Discharge Instructions (Signed)
 We saw you in the ER for the chest pain/shortness of breath. All of our cardiac workup is normal, including labs, EKG and chest X-RAY are normal. We are not sure what is causing your discomfort, but we feel comfortable sending you home at this time. The workup in the ER is not complete, and you should follow up with your primary care doctor for further evaluation.  Please return to the ER if you have worsening chest pain, shortness of breath, pain radiating to your jaw, shoulder, or back, sweats or fainting. Otherwise see the Cardiologist or your primary care doctor as requested.

## 2024-06-19 NOTE — ED Provider Triage Note (Signed)
 Emergency Medicine Provider Triage Evaluation Note  BINDI KLOMP , a 68 y.o. female  was evaluated in triage.  Pt complains of palpitations, symptoms of IBS, shortness of breath.  Review of Systems  Positive: Palpitations, shortness of breath Negative: Chest pain, back pain, leg swelling  Physical Exam  BP 109/80 (BP Location: Right Arm)   Pulse 92   Temp 99.4 F (37.4 C) (Oral)   Resp 16   Ht 5' 5.5 (1.664 m)   Wt 69.4 kg   SpO2 96%   BMI 25.07 kg/m  Gen:   Awake, no distress   Resp:  Normal effort  MSK:   Moves extremities without difficulty    Medical Decision Making  Medically screening exam initiated at 10:40 AM.  Appropriate orders placed.  ANTONIETA SLAVEN was informed that the remainder of the evaluation will be completed by another provider, this initial triage assessment does not replace that evaluation, and the importance of remaining in the ED until their evaluation is complete.  68 year old female presents with palpitations, shortness of breath that started since this morning.  Also admitting to ongoing IBS symptoms.  Initial workup ordered.  Patient evaluated sitting up in chair, fully clothed, limited PE. Orders placed. Patient was counseled that they need to remain in the ED until the completion of their work-up including a full H&P, additional testing and results of any tests.  The patient appears stable and the remainder of the encounter may be completed by another provider.   Bari Roxie HERO, DO 06/19/24 1047

## 2024-06-20 ENCOUNTER — Telehealth: Payer: Self-pay | Admitting: Registered Nurse

## 2024-06-20 DIAGNOSIS — G894 Chronic pain syndrome: Secondary | ICD-10-CM

## 2024-06-20 DIAGNOSIS — G8929 Other chronic pain: Secondary | ICD-10-CM

## 2024-06-20 DIAGNOSIS — M542 Cervicalgia: Secondary | ICD-10-CM

## 2024-06-20 DIAGNOSIS — M797 Fibromyalgia: Secondary | ICD-10-CM

## 2024-06-20 MED ORDER — HYDROCODONE-ACETAMINOPHEN 5-325 MG PO TABS: 1.0000 | ORAL_TABLET | Freq: Three times a day (TID) | ORAL | 0 refills | Status: DC | PRN

## 2024-06-20 NOTE — Telephone Encounter (Signed)
 Patty Salas, needs to have a scheduled appointment prior to me sending a prescription into the pharmacy. Will have support staff to call Ms. Salas.

## 2024-06-20 NOTE — Telephone Encounter (Signed)
 PMP was Reviewed.  Hydrocodone  e-scribed to pharmacy. Patty Salas is aware of the above.

## 2024-06-25 ENCOUNTER — Ambulatory Visit (INDEPENDENT_AMBULATORY_CARE_PROVIDER_SITE_OTHER): Admitting: Orthopedic Surgery

## 2024-06-25 DIAGNOSIS — M654 Radial styloid tenosynovitis [de Quervain]: Secondary | ICD-10-CM

## 2024-06-25 MED ORDER — LIDOCAINE HCL 1 % IJ SOLN
1.0000 mL | INTRAMUSCULAR | Status: AC | PRN
  Administered 2024-06-25: 1 mL

## 2024-06-25 MED ORDER — BETAMETHASONE SOD PHOS & ACET 6 (3-3) MG/ML IJ SUSP
6.0000 mg | INTRAMUSCULAR | Status: AC | PRN
  Administered 2024-06-25: 6 mg via INTRA_ARTICULAR

## 2024-06-25 NOTE — Progress Notes (Signed)
 Patty Salas - 68 y.o. female MRN [id number]  Date of birth: 01/07/1956  Office Visit Note: Visit Date: 06/25/2024 PCP: Chandra Toribio POUR, MD Referred by: Chandra Toribio POUR, MD  Subjective: No chief complaint on file.  HPI: Patty Salas is a pleasant 68 y.o. female who presents today as a referral for specific hand surgical evaluation for ongoing left wrist radial sided pain has been refractory conservative care.  She has undergone prior injections and bracing without lasting relief.  Pain radiates up and down the thumb region with extension into the proximal forearm along the radial border.  Has ongoing swelling and sensitivity in this region as well.  Pertinent ROS were reviewed with the patient and found to be negative unless otherwise specified above in HPI.  Assessment & Plan: Visit Diagnoses:  1. De Quervain's tenosynovitis, left     Plan: Extensive discussion was had with the patient today regarding her left wrist radial sided pain.  Patient has signs and symptoms consistent with de Quervain's tenosynovitis.  We discussed the underlying etiology and pathophysiology of this condition as well as treatment modalities ranging from conservative to surgical.  From a conservative standpoint, we discussed activity modification, anti-inflammatory medication both topical and oral, bracing, therapy and cortisone injections.  From a surgical standpoint, I explained to patient that we can perform right wrist first extensor compartment release should symptoms remain affected conservative care.  Understanding her options, she would like to proceed with additional injection to the left wrist first extensor compartment.  Given her multitude of comorbidities and ongoing other medical treatments, she would not be an appropriate surgical candidate at this time.  For this reason, we can perform cortisone injection to the left wrist first extensor compartment for symptom relief.  Continue utilizing bracing  as instructed.   Follow-up: No follow-ups on file.   Meds & Orders: No orders of the defined types were placed in this encounter.  No orders of the defined types were placed in this encounter.    Procedures: Hand/UE Inj: L extensor compartment 1 for de Quervain's tenosynovitis on 06/25/2024 1:51 PM Indications: pain Details: 25 G needle Medications: 1 mL lidocaine  1 %; 6 mg betamethasone  acetate-betamethasone  sodium phosphate  6 (3-3) MG/ML Outcome: tolerated well, no immediate complications Procedure, treatment alternatives, risks and benefits explained, specific risks discussed. Consent was given by the patient.          Clinical History: No specialty comments available.  She reports that she has never smoked. She has never used smokeless tobacco. No results for input(s): HGBA1C, LABURIC in the last 8760 hours.  Objective:   Vital Signs: There were no vitals taken for this visit.  Physical Exam  Gen: Well-appearing, in no acute distress; non-toxic CV: Regular Rate. Well-perfused. Warm.  Resp: Breathing unlabored on room air; no wheezing. Psych: Fluid speech in conversation; appropriate affect; normal thought process  Ortho Exam General: Patient is well appearing and in no distress.   Skin and Muscle: No skin changes are apparent to upper extremities.  Range of Motion and Palpation Tests: Mobility is full about the elbows with flexion and extension.  Wrist flexion and extension 65/55 bilaterally.  Digital flexion and extension are full.    No cords or nodules are palpated.  No triggering is observed.   Finklestein test is positive left side, significant pain with associated swelling and tenderness.    Neurologic, Vascular, Motor: Sensation is intact to light touch in the median/radial/ulnar distributions.   Fingers pink  and well perfused.  Capillary refill is brisk.     Imaging: No results found.  Past Medical/Family/Surgical/Social History: Medications &  Allergies reviewed per EMR, new medications updated. 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   S/P repair of ventral hernia 04/22/2021   PAT (paroxysmal atrial tachycardia)    Renal cell cancer (HCC) 11/21/2018   Family history of colon cancer in father 01/07/2018   History of adenomatous polyp of colon 01/07/2018   Internal hemorrhoids 01/07/2018   Irritable bowel syndrome with diarrhea 01/07/2018   Vitamin D  deficiency 08/28/2017   Primary hypertension 08/28/2017   Herpes simplex- oral lesions 08/10/2017   Bipolar 1 disorder, mixed, moderate (HCC) 08/10/2017   History of anaphylaxis 08/10/2017   Postmenopausal syndrome 08/10/2017   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   History of kidney cancer 02/22/2016   De Quervain's tenosynovitis, right 04/24/2014   Enlarged lymph node 02/12/2014   BPPV (benign paroxysmal positional vertigo) 12/05/2013   Cervical spondylosis without myelopathy 09/17/2013   Carpal tunnel syndrome 01/01/2013   Wrist tendonitis 01/10/2012   Fibromyalgia/myofascial pain syndrome 12/06/2011   Lumbar spondylosis 12/06/2011   Lumbar degenerative disc disease 12/06/2011   Depression with anxiety 12/06/2011   Migraine aura, persistent, intractable 12/06/2011   Past Medical History:  Diagnosis Date   Anxiety    Arthritis    Asthma    Bipolar 2 disorder (HCC)    pt stated, I have Bipolar 2 and it is remission   Bipolar affective (HCC)    CAD (coronary artery disease), native artery transplanted heart    Nonobstructive by coronary CTA 08/2021 with less than 25% stenosis in the LAD, RCA and left circumflex.   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   Full dentures    Gout    Headache(784.0)    migraines   Herpesviral infection    Hypertension    IBS (irritable bowel syndrome)    with diarrhea   Lymphadenopathy    Myalgia and myositis, unspecified    Nephrolithiasis 02/11/2013   PAT (paroxysmal atrial tachycardia)    asymptomatic 10 beat run on event monitor 09/2020   PFO (patent foramen ovale)    Small PFO noted on coronary CTA   Pneumonia    PONV (postoperative nausea and vomiting)    PTSD (post-traumatic stress disorder)    Renal calculi    Restless legs syndrome (RLS)    Thoracic radiculopathy    Vitamin D  deficiency    Wears glasses    Family History  Problem Relation Age of Onset   Cancer Mother        stomach   Migraines Mother    Arthritis Mother    Emphysema Mother    Heart disease Father    Hypertension Father    Cancer Father        colon   Dementia Father    Hypertension Brother    Migraines Brother    Hypertension  Brother    Migraines Brother    Alcohol  abuse Brother    Bipolar disorder Brother    Migraines Daughter    Arthritis Maternal Grandmother    Cancer Maternal Grandmother        stomach   Diabetes Maternal Grandmother    Stroke Maternal Grandmother    Cancer Maternal Aunt        lung   Emphysema Maternal Aunt    Cancer Paternal Uncle        colon   Hypertension Maternal Aunt    Heart disease Maternal Aunt    Cancer Paternal Uncle        lung   Past Surgical History:  Procedure Laterality Date    kidney stones     ABDOMINAL ADHESION SURGERY     ABDOMINAL HYSTERECTOMY  1995   partial- hemmoraged after surgery-stitch came loose   APPENDECTOMY  1993   hemmoraged after surgery- stitch came loose   CERVICAL DISC SURGERY  06/04/2001   with fusion    CHOLECYSTECTOMY  1985   colonoscopy     COLONOSCOPY     CYSTOSCOPY     FOOT SURGERY     lt   INCISIONAL HERNIA REPAIR N/A 04/22/2021   Procedure: LAPAROSCOPIC INCISIONAL HERNIA REPAIR WITH MESH;  Surgeon: Signe Mitzie LABOR, MD;  Location: [facility];  Service: General;  Laterality: N/A;   jj stent  07/2002   with ureteroscopy, cystoscopy and then removal of stent in office   LAPAROSCOPIC LYSIS OF ADHESIONS N/A 04/22/2021   Procedure: LYSIS OF ADHESIONS;  Surgeon: Signe Mitzie LABOR, MD;  Location: [facility];  Service: General;  Laterality: N/A;   LITHOTRIPSY     LYMPH NODE BIOPSY Right 03/06/2014   Procedure: right groin LYMPH NODE BIOPSY;  Surgeon: Deward GORMAN Curvin DOUGLAS, MD;  Location: [facility];  Service: General;  Laterality: Right;   LYMPHADENECTOMY N/A 12/29/2015   Procedure: RETROPERITONEAL LYMPHADENECTOMY;  Surgeon: Ricardo Likens, MD;  Location: [facility];  Service: Urology;  Laterality: N/A;   NECK SURGERY  2002   ROBOT ASSISTED LAPAROSCOPIC NEPHRECTOMY Left 12/29/2015   Procedure: XI ROBOTIC ASSISTED LEFT LAPAROSCOPIC NEPHRECTOMY;  Surgeon: Ricardo Likens, MD;  Location: [facility];  Service: Urology;  Laterality: Left;   TONSILLECTOMY  1976   Social History   Occupational History   Occupation: HOUSEWIFE    Associate Professor: UNEMPLOYED  Tobacco Use   Smoking status: Never   Smokeless tobacco: Never  Vaping Use   Vaping status: Never Used  Substance and Sexual Activity   Alcohol  use: No   Drug use: No   Sexual activity: Not on file    Atthew Coutant Estela) Arlinda, M.D. [facility], Hand Surgery

## 2024-06-27 ENCOUNTER — Encounter: Attending: Physical Medicine & Rehabilitation | Admitting: Registered Nurse

## 2024-06-27 ENCOUNTER — Encounter: Payer: Self-pay | Admitting: Registered Nurse

## 2024-06-27 VITALS — BP 142/87 | HR 87 | Ht 65.5 in | Wt 151.0 lb

## 2024-06-27 DIAGNOSIS — G894 Chronic pain syndrome: Secondary | ICD-10-CM | POA: Insufficient documentation

## 2024-06-27 DIAGNOSIS — Z79891 Long term (current) use of opiate analgesic: Secondary | ICD-10-CM | POA: Insufficient documentation

## 2024-06-27 DIAGNOSIS — M25561 Pain in right knee: Secondary | ICD-10-CM | POA: Insufficient documentation

## 2024-06-27 DIAGNOSIS — M542 Cervicalgia: Secondary | ICD-10-CM | POA: Insufficient documentation

## 2024-06-27 DIAGNOSIS — Z5181 Encounter for therapeutic drug level monitoring: Secondary | ICD-10-CM | POA: Insufficient documentation

## 2024-06-27 DIAGNOSIS — M546 Pain in thoracic spine: Secondary | ICD-10-CM | POA: Insufficient documentation

## 2024-06-27 DIAGNOSIS — M25562 Pain in left knee: Secondary | ICD-10-CM | POA: Diagnosis present

## 2024-06-27 DIAGNOSIS — G8929 Other chronic pain: Secondary | ICD-10-CM | POA: Insufficient documentation

## 2024-06-27 MED ORDER — HYDROCODONE-ACETAMINOPHEN 5-325 MG PO TABS: 1.0000 | ORAL_TABLET | Freq: Three times a day (TID) | ORAL | 0 refills | Status: DC | PRN

## 2024-06-27 NOTE — Progress Notes (Signed)
 Subjective:    Patient ID: Patty Salas, female    DOB: [birth year], 68 y.o.   MRN: [id number]  HPI: Patty Salas is a 68 y.o. female who returns for follow up appointment for chronic pain and medication refill. She states her pain is located in her neck radiating into her bilateral shoulders, upper- lower back radiating into her bilateral hips and bilateral lower extremities.   Patty Salas reports she has a migraine, onset was yesterday, she reports left facial numbness, she states this happens when she has a migraine. Also reports she has drooling, no drooling noted . CVA assessment performed with Dr. Urbano. She refuses ED evaluation. At times her speech is not clear, this provider noted she was having dysarthria. This provider and Dr Urbano recommended she go to [facility] ED for evaluation, she refuses . Spoke with Landry Elbe our director, regarding the above assessment  we called EMS, for her to be evaluated. Patty Salas is aware and verbalizes understanding.  EMS arrived and Patty Salas refused to be assessed by EMS or being transported to [place].   She also reports left intercostal pain, she denies chest pain, she was seen in ED at [facility] on 06/19/2024, note was reviewed.   She rates her pain 9. Her current exercise regime is walking with walker.   [name] Morphine equivalent is 15.38 MME.   Oral Swab was Performed today.    Mr. Nofsinger called Patty Salas, she spoke to her husband, her husband wants her to wait here for a few minutes, then he told her to call him.   The above was discussed with Landry Elbe the director.    Pain Inventory Average Pain 7 Pain Right Now 9 My pain is constant, sharp, burning, dull, stabbing, tingling, and aching  In the last 24 hours, has pain interfered with the following? General activity 8 Relation with others 7 Enjoyment of life 7 What TIME of day is your pain at its worst? morning , daytime, evening, and  night Sleep (in general) Good  Pain is worse with: walking, bending, sitting, standing, and some activites Pain improves with: rest, heat/ice, therapy/exercise, pacing activities, medication, and injections Relief from Meds: 6  Family History  Problem Relation Age of Onset   Cancer Mother        stomach   Migraines Mother    Arthritis Mother    Emphysema Mother    Heart disease Father    Hypertension Father    Cancer Father        colon   Dementia Father    Hypertension Brother    Migraines Brother    Hypertension Brother    Migraines Brother    Alcohol  abuse Brother    Bipolar disorder Brother    Migraines Daughter    Arthritis Maternal Grandmother    Cancer Maternal Grandmother        stomach   Diabetes Maternal Grandmother    Stroke Maternal Grandmother    Cancer Maternal Aunt        lung   Emphysema Maternal Aunt    Cancer Paternal Uncle        colon   Hypertension Maternal Aunt    Heart disease Maternal Aunt    Cancer Paternal Uncle        lung   Social History   Socioeconomic History   Marital status: Married    Spouse name: Not on file   Number of  children: 1   Years of education: COLLEGE1   Highest education level: Some college, no degree  Occupational History   Occupation: HOUSEWIFE    Associate Professor: UNEMPLOYED  Tobacco Use   Smoking status: Never   Smokeless tobacco: Never  Vaping Use   Vaping status: Never Used  Substance and Sexual Activity   Alcohol  use: No   Drug use: No   Sexual activity: Not on file  Other Topics Concern   Not on file  Social History Narrative   Patient is right handed.   Patient drinks caffeine occasionally.   Social Drivers of Health   Financial Resource Strain: Patient Declined (05/16/2024)   Overall Financial Resource Strain (CARDIA)    Difficulty of Paying Living Expenses: Patient declined  Food Insecurity: No Food Insecurity (05/16/2024)   Hunger Vital Sign    Worried About Running Out of Food in the Last Year:  Never true    Ran Out of Food in the Last Year: Never true  Transportation Needs: Unmet Transportation Needs (12/20/2023)   PRAPARE - Transportation    Lack of Transportation (Medical): Yes    Lack of Transportation (Non-Medical): Yes  Physical Activity: Inactive (05/16/2024)   Exercise Vital Sign    Days of Exercise per Week: 0 days    Minutes of Exercise per Session: Not on file  Stress: No Stress Concern Present (05/16/2024)   [employer] of Occupational Health - Occupational Stress Questionnaire    Feeling of Stress: Only a little  Social Connections: Moderately Integrated (05/16/2024)   Social Connection and Isolation Panel    Frequency of Communication with Friends and Family: Three times a week    Frequency of Social Gatherings with Friends and Family: Once a week    Attends Religious Services: More than 4 times per year    Active Member of Clubs or Organizations: No    Attends Engineer, structural: Not on file    Marital Status: Married   Past Surgical History:  Procedure Laterality Date    kidney stones     ABDOMINAL ADHESION SURGERY     ABDOMINAL HYSTERECTOMY  1995   partial- hemmoraged after surgery-stitch came loose   APPENDECTOMY  1993   hemmoraged after surgery- stitch came loose   CERVICAL DISC SURGERY  06/04/2001   with fusion   CHOLECYSTECTOMY  1985   colonoscopy     COLONOSCOPY     CYSTOSCOPY     FOOT SURGERY     lt   INCISIONAL HERNIA REPAIR N/A 04/22/2021   Procedure: LAPAROSCOPIC INCISIONAL HERNIA REPAIR WITH MESH;  Surgeon: Signe Mitzie LABOR, MD;  Location: [facility];  Service: General;  Laterality: N/A;   jj stent  07/2002   with ureteroscopy, cystoscopy and then removal of stent in office   LAPAROSCOPIC LYSIS OF ADHESIONS N/A 04/22/2021   Procedure: LYSIS OF ADHESIONS;  Surgeon: Signe Mitzie LABOR, MD;  Location: [facility];  Service: General;  Laterality: N/A;   LITHOTRIPSY     LYMPH NODE BIOPSY Right 03/06/2014   Procedure: right groin LYMPH NODE  BIOPSY;  Surgeon: Deward GORMAN Curvin DOUGLAS, MD;  [facility];  Service: General;  Laterality: Right;   LYMPHADENECTOMY N/A 12/29/2015   Procedure: RETROPERITONEAL LYMPHADENECTOMY;  Surgeon: Ricardo Likens, MD;  Location: [facility];  Service: Urology;  Laterality: N/A;   NECK SURGERY  2002   ROBOT ASSISTED LAPAROSCOPIC NEPHRECTOMY Left 12/29/2015   Procedure: XI ROBOTIC ASSISTED LEFT LAPAROSCOPIC NEPHRECTOMY;  Surgeon: Ricardo Likens, MD;  Location: [facility];  Service: Urology;  Laterality: Left;   TONSILLECTOMY  1976   Past Surgical History:  Procedure Laterality Date    kidney stones     ABDOMINAL ADHESION SURGERY     ABDOMINAL HYSTERECTOMY  1995   partial- hemmoraged after surgery-stitch came loose   APPENDECTOMY  1993   hemmoraged after surgery- stitch came loose   CERVICAL DISC SURGERY  06/04/2001   with fusion   CHOLECYSTECTOMY  1985   colonoscopy     COLONOSCOPY     CYSTOSCOPY     FOOT SURGERY     lt   INCISIONAL HERNIA REPAIR N/A 04/22/2021   Procedure: LAPAROSCOPIC INCISIONAL HERNIA REPAIR WITH MESH;  Surgeon: Signe Mitzie LABOR, MD;  Location: [facility];  Service: General;  Laterality: N/A;   jj stent  07/2002   with ureteroscopy, cystoscopy and then removal of stent in office   LAPAROSCOPIC LYSIS OF ADHESIONS N/A 04/22/2021   Procedure: LYSIS OF ADHESIONS;  Surgeon: Signe Mitzie LABOR, MD;  Location: [facility];  Service: General;  Laterality: N/A;   LITHOTRIPSY     LYMPH NODE BIOPSY Right 03/06/2014   Procedure: right groin LYMPH NODE BIOPSY;  Surgeon: Deward GORMAN Curvin DOUGLAS, MD;  Location: [facility];  Service: General;  Laterality: Right;   LYMPHADENECTOMY N/A 12/29/2015   Procedure: RETROPERITONEAL LYMPHADENECTOMY;  Surgeon: Ricardo Likens, MD;  Location: [facility];  Service: Urology;  Laterality: N/A;   NECK SURGERY  2002   ROBOT ASSISTED LAPAROSCOPIC NEPHRECTOMY Left 12/29/2015   Procedure: XI ROBOTIC ASSISTED LEFT LAPAROSCOPIC NEPHRECTOMY;  Surgeon: Ricardo Likens,  MD;  Location: [facility];  Service: Urology;  Laterality: Left;   TONSILLECTOMY  1976   Past Medical History:  Diagnosis Date   Anxiety    Arthritis    Asthma    Bipolar 2 disorder (HCC)    pt stated, I have Bipolar 2 and it is remission   Bipolar affective (HCC)    CAD (coronary artery disease), native artery transplanted heart    Nonobstructive by coronary CTA 08/2021 with less than 25% stenosis in the LAD, RCA and left circumflex.   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   Full dentures    Gout    Headache(784.0)    migraines   Herpesviral infection    Hypertension    IBS (irritable bowel syndrome)    with diarrhea   Lymphadenopathy    Myalgia and myositis, unspecified    Nephrolithiasis 02/11/2013   PAT (paroxysmal atrial tachycardia)    asymptomatic 10 beat run on event monitor 09/2020   PFO (patent foramen ovale)    Small PFO noted on coronary CTA   Pneumonia    PONV (postoperative nausea and vomiting)    PTSD (post-traumatic stress disorder)    Renal calculi    Restless legs syndrome (RLS)    Thoracic radiculopathy    Vitamin D  deficiency    Wears glasses    BP (!) 149/80   Pulse 87   Ht 5' 5.5 (1.664 m)   Wt 151 lb (68.5 kg)   SpO2 92%   BMI 24.75 kg/m   Opioid Risk Score:   Fall Risk Score:  `1  Depression screen [facility] 2/9     06/27/2024    9:50 AM 04/18/2024   12:22 PM 02/19/2024    9:57 AM 01/16/2024   11:12 AM 10/11/2023   11:49 AM 08/27/2023    1:54 PM 07/31/2023    2:20 PM  Depression screen PHQ 2/9  Decreased Interest 1 0 0 0 1 0 1  Down, Depressed, Hopeless 1 0 0 0 1 0 3  PHQ - 2 Score 2 0 0 0 2 0 4  Altered sleeping  0 1 0   0  Tired, decreased energy  0 0 0   0  Change in appetite  0 0 0   0  Feeling  bad or failure about yourself   0 0 0  0 0  Trouble concentrating  0 0 0   0  Moving slowly or fidgety/restless  0 0 1   0  Suicidal thoughts  0 0 0   0  PHQ-9 Score  0 1 1   4   Difficult doing work/chores  Somewhat difficult Somewhat difficult Not difficult at all   Somewhat difficult    Review of Systems  Musculoskeletal:  Positive for arthralgias and myalgias.  Psychiatric/Behavioral:  Positive for dysphoric mood.   All other systems reviewed and are negative.      Objective:   Physical Exam Vitals and nursing note reviewed.  Constitutional:      Appearance: Normal appearance.  Neck:     Comments: Cervical Paraspinal Tenderness: C-5-C-6  Cardiovascular:     Rate and Rhythm: Normal rate and regular rhythm.     Pulses: Normal pulses.     Heart sounds: Normal heart sounds.  Pulmonary:     Effort: Pulmonary effort is normal.     Breath sounds: Normal breath sounds.  Musculoskeletal:     Comments: Normal Muscle Bulk and Muscle Testing Reveals:  Upper Extremities: Right: Decreased ROM 45 Degrees  and Muscle  2/5 Left Decreased ROM 30 Degrees and Muscle Strength 0/5 Bilateral AC Joint Tenderness Wearing Bilateral   Thoracic and Lumbar Hypersensitivity Bilateral Greater Trochanter tenderness Lower Extremities: Right: Full ROM and Muscle Strength 5/5 Left Lower extremity: Decreased ROM and Muscle Strength 5/5 Left Lower Extremity Flexion Produces Pain into her left lower extremity.  Arises from Table slowly using walker for support Antalgic  Gait     Skin:    General: Skin is warm and dry.  Neurological:     Mental Status: She is alert and oriented to person, place, and time.  Psychiatric:        Mood and Affect: Mood normal.        Behavior: Behavior normal.     Comments: Affect: Flat          Assessment & Plan:  History of fibromyalgia with myofascial pain and multiple trigger points.Continue with Heat and exercise Regime.   Continue with current medication  regimen with  gabapentin . 06/27/2024 2 Chronic migraine headaches..Continue current medication regimen with Almotriptan , she has a scheduled appointment wth Dr Skeet on 04/02/2024 Continue to Monitor. S/P Botox.on 05/30/2017. 06/27/2024 3. Midline Low Back Pain/ Lumbar Spondylosis/Lumbar degenerative disk disease, L4-5/ Lumbar Radiculitis: Continue with HEP and current medication regimen. Continue Hydrocodone  5/325 mg one tablet every 8 hours as needed #80. SABRAContinue with slow weaning of Hydrocodone .  06/27/2024 4. 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 Urology Following. 06/27/2024. 5. 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 06/27/2024 6. Cervicalgia/ Cervical RadiculitisCervical Spondylosis:  Continue  current medication regiment with  Gabapentin . Continue  with HEP and Continue to monitor. 06/27/2024 7. Muscle Spasm: Myofascial Pain: She is svcheduled for Trigger Point Injection with Dr Babs : Continue current medication regimen with Flexeril  as needed. 06/27/2024 8. Bilateral Greater Trochanter Bursitis:  Continue to alternate with ice and heat therapy. Continue HEP as Tolerated. Continue to monitor. 06/27/2024 9. Bilateral Knee Pain: S/P Knee Injection by Dr Harden on 02/23/2020. Orthopedics Following. Continue to Monitor. 06/27/2024  10. Left Groin Pain: No complaints today.Ms. Vandekamp reports Oncology Following. Continue to Monitor. 06/27/2024.  11. Fall at Home: No Falls this month. Educated on falls prevention, she verbalizes understanding. Continue to Monitor. 06/27/2024  12. Everitt Hahn Tenosynovitis: Ortho Following. . We will Continue to Monitor. 04/18/2024

## 2024-07-02 ENCOUNTER — Other Ambulatory Visit: Payer: Self-pay | Admitting: Family Medicine

## 2024-07-02 LAB — DRUG TOX MONITOR 1 W/CONF, ORAL FLD

## 2024-07-02 LAB — DRUG TOX ALC METAB W/CON, ORAL FLD: Alcohol Metabolite: NEGATIVE ng/mL (ref <25)

## 2024-07-18 ENCOUNTER — Other Ambulatory Visit: Payer: Self-pay | Admitting: Family Medicine

## 2024-07-29 ENCOUNTER — Ambulatory Visit: Admitting: Pharmacist Clinician (PhC)/ Clinical Pharmacy Specialist

## 2024-07-30 ENCOUNTER — Ambulatory Visit (INDEPENDENT_AMBULATORY_CARE_PROVIDER_SITE_OTHER): Admitting: Family Medicine

## 2024-07-30 VITALS — BP 132/74 | HR 111 | Temp 99.0°F | Ht 65.5 in | Wt 153.5 lb

## 2024-07-30 DIAGNOSIS — Z789 Other specified health status: Secondary | ICD-10-CM

## 2024-07-30 DIAGNOSIS — T466X5A Adverse effect of antihyperlipidemic and antiarteriosclerotic drugs, initial encounter: Secondary | ICD-10-CM

## 2024-07-30 DIAGNOSIS — G43519 Persistent migraine aura without cerebral infarction, intractable, without status migrainosus: Secondary | ICD-10-CM | POA: Diagnosis not present

## 2024-07-30 DIAGNOSIS — K580 Irritable bowel syndrome with diarrhea: Secondary | ICD-10-CM

## 2024-07-30 DIAGNOSIS — G720 Drug-induced myopathy: Secondary | ICD-10-CM | POA: Diagnosis not present

## 2024-07-30 DIAGNOSIS — R7989 Other specified abnormal findings of blood chemistry: Secondary | ICD-10-CM

## 2024-07-30 DIAGNOSIS — E782 Mixed hyperlipidemia: Secondary | ICD-10-CM

## 2024-07-30 NOTE — Assessment & Plan Note (Signed)
 Reports severe, ongoing diarrhea with incontinence. Has an upcoming appointment with a gastroenterologist on 08/06/2024. - Continue Imodium as needed for symptomatic control. - Follow up after GI appointment.

## 2024-07-30 NOTE — Progress Notes (Signed)
 Established Patient Office Visit  Subjective   Patient ID: Patty Salas, female    DOB: March 07, 1956  Age: 68 y.o. MRN: [id number]  Chief Complaint  Patient presents with   Medical Management of Chronic Issues    HPI  Subjective - Presents for follow-up. Brought in lab results from an ER visit on 06/19/2024. This visit was for a severe migraine, where an ambulance was called per protocol. Reports history of palsy-like symptoms with migraines, including facial drooping and drooling. Also reports recent URI. - Reports ongoing issues with migraines since age 35.  - Reports ongoing GI issues, has appointment with GI (Dr. Kendall) on 08/06/2024. Reports significant diarrhea, requiring pads for incontinence. Avoids acidic foods/juices like orange juice and tomatoes due to oral irritation. Drinks only ginger ale and water . - Reports history of kidney stones since age 45.  - Reports history of high cholesterol since teens. Is willing to start medication. Has appointment with lipid clinic scheduled. - Discussed medication management. Goal is to wean off gabapentin  and lorazepam .  Reports using hydrocodone  sparingly; brought in bottle from 07/27 showing 11 pills remaining from a prescription of 15. Takes half a Norco if needed. Takes promethazine  for migraines, quarters the tablets. - Reports husband's significant cardiac history including loop recorder, Watchman filter, pacemaker, two strokes, and a recent hospitalization in February for a stroke and subsequent cardiac stenting. Patient stayed home due to a URI with fever at that time. Husband has stage 4 kidney disease and type 2 diabetes.  Medications Current medications mentioned include gabapentin , lorazepam , hydrocodone , methadone, promethazine , trospium  chloride, and Toprol . Reports taking Toprol  and trospium  chloride this morning. Took four Imodium tablets this morning for diarrhea. Reports history of taking levofloxacin  in February.  PMH, PSH,  FH, Social Hx PMHx: Migraines with facial palsy, history of post-concussion syndrome, high cholesterol, kidney stones (since age 33), IBS, anxiety (per medication), chronic pain, hypertension (per medication), history of pancreatitis (per lipase test), hernia. Reports palsy with migraines.  PSH: History of left kidney removal (robotic surgery) with lymph node dissection, subsequent hernia repair with adhesions. History of appendectomy with adhesions. Gallbladder surgery. FH: Mother had high risk for blood clots. Social Hx: Denies alcohol  or drug use other than prescribed. Drinks ginger ale and water .  ROS Pertinent positives: Migraines, facial drooping/drooling with migraines, diarrhea, incontinence, chronic pain, history of falls. Reports chills last night. Pertinent negatives: No fever noted at visit. Denies chest pain, though describes substernal pain similar to prior hernia pain.   The 10-year ASCVD risk score (Arnett DK, et al., 2019) is: 13.3%  Health Maintenance Due  Topic Date Due   Zoster Vaccines- Shingrix (1 of 2) 12/29/1973   Mammogram  05/20/2009   DTaP/Tdap/Td (2 - Td or Tdap) 10/12/2016   Influenza Vaccine  05/02/2024   COVID-19 Vaccine (5 - 2025-26 season) 06/02/2024   Medicare Annual Wellness (AWV)  07/30/2024      Objective:     BP 132/74   Pulse (!) 111   Temp 99 F (37.2 C) (Oral)   Ht 5' 5.5 (1.664 m)   Wt 153 lb 8 oz (69.6 kg) Comment: pt reports at home without shoes is 150.1  SpO2 98%   BMI 25.16 kg/m    Physical Exam General: Alert and oriented, in no acute distress. HEENT: Pupils are equal, round, and reactive to light. Extraocular movements intact. Oropharynx is clear. CV: Regular rate and rhythm, no murmurs, rubs, or gallops. PULM: Lungs clear to auscultation bilaterally. Msk: ambulates  with rolling walker.  Wearing wrist braces b/l.   Psych: pleasant. Loquacious.  Tends to perseverate on certain topics.    No results found for any visits  on 07/30/24.      Assessment & Plan:   Statin intolerance  Irritable bowel syndrome with diarrhea Assessment & Plan: Reports severe, ongoing diarrhea with incontinence. Has an upcoming appointment with a gastroenterologist on 08/06/2024. - Continue Imodium as needed for symptomatic control. - Follow up after GI appointment.   Mixed hyperlipidemia Assessment & Plan: Longstanding history of high cholesterol, noted since teens. Patient is motivated to begin treatment and understands diet alone is insufficient. Has an upcoming appointment with the lipid clinic. - Await lipid clinic recommendations. - Check lipid panel today.   Migraine aura, persistent, intractable Assessment & Plan: Long history of migraines since age 80. Reports limiting acute treatment to avoid rebound headaches. Expressed interest in acupuncture. - Continue current management. - Patient to follow up with neurology and counseling for management.   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  Documented in her chart as a anaphylactic reaction.    Elevated LFTs Assessment & Plan: Recent ER labs from 06/19/2024 showed mildly elevated direct bilirubin and AST. BMP, CBC, and lipase were normal. Patient was informed of the results. - Repeat LFTs today.    I spent 40 min in the mgmt of this patient  Return in about 6 months (around 01/28/2025).    Toribio MARLA Slain, MD

## 2024-07-30 NOTE — Patient Instructions (Addendum)
 It was nice to see you today,  We addressed the following topics today:  - I will try to get your labs, including a repeat liver function test and a cholesterol panel. - Please follow up with the lipid clinic as scheduled. - Please keep your gastroenterology appointment on 08/06/2024 to address the ongoing diarrhea. - Continue to work with your other specialists for management of your chronic pain and migraines.  Have a great day,  Rolan Slain, MD

## 2024-07-30 NOTE — Assessment & Plan Note (Signed)
 Longstanding history of high cholesterol, noted since teens. Patient is motivated to begin treatment and understands diet alone is insufficient. Has an upcoming appointment with the lipid clinic. - Await lipid clinic recommendations. - Check lipid panel today.

## 2024-07-30 NOTE — Assessment & Plan Note (Signed)
 Intolerant of statins.  Documented in her chart as a anaphylactic reaction.

## 2024-07-30 NOTE — Assessment & Plan Note (Signed)
 Long history of migraines since age 68. Reports limiting acute treatment to avoid rebound headaches. Expressed interest in acupuncture. - Continue current management. - Patient to follow up with neurology and counseling for management.

## 2024-08-01 ENCOUNTER — Telehealth: Payer: Self-pay

## 2024-08-01 NOTE — Telephone Encounter (Signed)
 Copied from CRM [id number]. Topic: Referral - Question >> Aug 01, 2024  9:57 AM Emylou G wrote: Reason for CRM: Patient called.. said wants to make sure her appt / referral 02:00 PM - Initial consult .[facility]  - [place]  Gastroenterology - Kendall, Hoy Jansky, MD - has been sent, approved, auth also by the ins.SABRA [place] to call her a second time if the call doesn't go through the first time

## 2024-08-01 NOTE — Telephone Encounter (Signed)
 Contacted the number given by patient [phone] to find out if a new referral is needed since switching provider since her est provider is no longer there. The rep confirms that a New Referral is not needed.

## 2024-08-04 ENCOUNTER — Encounter: Payer: Self-pay | Admitting: Radiology

## 2024-08-04 DIAGNOSIS — R7989 Other specified abnormal findings of blood chemistry: Secondary | ICD-10-CM | POA: Insufficient documentation

## 2024-08-04 NOTE — Assessment & Plan Note (Signed)
 Recent ER labs from 06/19/2024 showed mildly elevated direct bilirubin and AST. BMP, CBC, and lipase were normal. Patient was informed of the results. - Repeat LFTs today.

## 2024-08-05 ENCOUNTER — Encounter: Payer: Self-pay | Admitting: Family Medicine

## 2024-08-05 ENCOUNTER — Telehealth: Admitting: Family Medicine

## 2024-08-05 ENCOUNTER — Ambulatory Visit: Payer: Self-pay

## 2024-08-05 DIAGNOSIS — R0989 Other specified symptoms and signs involving the circulatory and respiratory systems: Secondary | ICD-10-CM | POA: Diagnosis not present

## 2024-08-05 DIAGNOSIS — I10 Essential (primary) hypertension: Secondary | ICD-10-CM

## 2024-08-05 NOTE — Progress Notes (Signed)
 Virtual Visit via Telephone Note   This visit type was conducted secondary to patient preference or provider felt to be most appropriate for this patient at this time.  All issues noted in this document were discussed and addressed.  No physical exam could be performed with this format.  Patient verbally consented to a telephone visit.   Date:  08/05/2024   ID:  Patty Salas, DOB 1956-03-06, MRN [id number]  Patient Location: Home Provider Location: Office/Clinic  PCP:  Chandra Toribio POUR, MD   History of Present Illness:    Chief Complaint  Patient presents with   Sinus Problem   Discussed the use of AI scribe software for clinical note transcription with the patient, who gave verbal consent to proceed.  History of Present Illness   Patty Salas is a 68 year old female who presents with dizziness and sore throat.  Upper respiratory symptoms - Sore throat began yesterday and has been persistent since onset - Fever of 100.7 degrees - No testing for influenza, COVID-19, or streptococcal pharyngitis performed - No chest pain, shortness of breath, or palpitations  Dizziness and headache - Dizziness began yesterday, concurrent with sore throat - Uncertainty regarding etiology of headache, possibly related to blood pressure or sinus issues  Medication and allergen exposure - Currently taking Robitussin containing dextromethorphan and guaifenesin; dose and frequency unspecified - Known allergy to red dye number forty; current medication does not cause symptoms  Sinus and symptom management - No use of antihistamines or Flonase for sinus symptoms - Uses humidifier and nasal saline for symptom relief  Hypertension management - Did not take blood pressure today, unable to get a good reading - Taking Metoprolol  25 mg by mouth once daily     Past Medical History:  Diagnosis Date   Anxiety    Arthritis    Asthma    Bipolar 2 disorder (HCC)    pt stated, I have Bipolar 2 and  it is remission   Bipolar affective (HCC)    CAD (coronary artery disease), native artery transplanted heart    Nonobstructive by coronary CTA 08/2021 with less than 25% stenosis in the LAD, RCA and left circumflex.   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   Full dentures    Gout    Headache(784.0)    migraines   Herpesviral infection    Hypertension    IBS (irritable bowel syndrome)    with diarrhea   Lymphadenopathy    Myalgia and myositis, unspecified    Nephrolithiasis 02/11/2013   PAT (paroxysmal atrial tachycardia)    asymptomatic 10 beat run on event monitor 09/2020   PFO (patent foramen ovale)    Small PFO noted on coronary CTA   Pneumonia    PONV (postoperative nausea and vomiting)    PTSD (post-traumatic stress disorder)    Renal calculi    Restless legs syndrome (RLS)    Thoracic radiculopathy    Vitamin D  deficiency    Wears glasses     Past Surgical History:  Procedure Laterality Date    kidney stones     ABDOMINAL ADHESION SURGERY     ABDOMINAL HYSTERECTOMY  1995   partial- hemmoraged after  surgery-stitch came loose   APPENDECTOMY  1993   hemmoraged after surgery- stitch came loose   CERVICAL DISC SURGERY  06/04/2001   with fusion   CHOLECYSTECTOMY  1985   colonoscopy     COLONOSCOPY     CYSTOSCOPY     FOOT SURGERY     lt   INCISIONAL HERNIA REPAIR N/A 04/22/2021   Procedure: LAPAROSCOPIC INCISIONAL HERNIA REPAIR WITH MESH;  Surgeon: Signe Mitzie LABOR, MD;  Location: [facility];  Service: General;  Laterality: N/A;   jj stent  07/2002   with ureteroscopy, cystoscopy and then removal of stent in office   LAPAROSCOPIC LYSIS OF ADHESIONS N/A 04/22/2021   Procedure: LYSIS OF ADHESIONS;  Surgeon: Signe Mitzie LABOR, MD;   Location: [facility];  Service: General;  Laterality: N/A;   LITHOTRIPSY     LYMPH NODE BIOPSY Right 03/06/2014   Procedure: right groin LYMPH NODE BIOPSY;  Surgeon: Deward GORMAN Curvin DOUGLAS, MD;  Location: [facility];  Service: General;  Laterality: Right;   LYMPHADENECTOMY N/A 12/29/2015   Procedure: RETROPERITONEAL LYMPHADENECTOMY;  Surgeon: Ricardo Likens, MD;  Location: [facility];  Service: Urology;  Laterality: N/A;   NECK SURGERY  2002   ROBOT ASSISTED LAPAROSCOPIC NEPHRECTOMY Left 12/29/2015   Procedure: XI ROBOTIC ASSISTED LEFT LAPAROSCOPIC NEPHRECTOMY;  Surgeon: Ricardo Likens, MD;  Location: [facility];  Service: Urology;  Laterality: Left;   TONSILLECTOMY  1976    Family History  Problem Relation Age of Onset   Cancer Mother        stomach   Migraines Mother    Arthritis Mother    Emphysema Mother    Heart disease Father    Hypertension Father    Cancer Father        colon   Dementia Father    Hypertension Brother    Migraines Brother    Hypertension Brother    Migraines Brother    Alcohol  abuse Brother    Bipolar disorder Brother    Migraines Daughter    Arthritis Maternal Grandmother    Cancer Maternal Grandmother        stomach   Diabetes Maternal Grandmother    Stroke Maternal Grandmother    Cancer Maternal Aunt        lung   Emphysema Maternal Aunt    Cancer Paternal Uncle        colon   Hypertension Maternal Aunt    Heart disease Maternal Aunt    Cancer Paternal Uncle        lung    Social History   Socioeconomic History   Marital status: Married    Spouse name: Not on file   Number of children: 1   Years of education: COLLEGE1   Highest education level: Some college, no degree  Occupational History   Occupation: HOUSEWIFE    Associate Professor: UNEMPLOYED  Tobacco Use   Smoking status: Never   Smokeless tobacco: Never  Vaping Use   Vaping status: Never Used  Substance and Sexual Activity   Alcohol  use: No   Drug use: No   Sexual activity: Not on file   Other Topics Concern   Not on file  Social History Narrative   Patient is right handed.   Patient drinks caffeine occasionally.   Social Drivers of Health   Financial Resource Strain: Patient Declined (07/30/2024)   Overall Financial Resource Strain (CARDIA)    Difficulty of Paying Living Expenses: Patient declined  Food Insecurity: No Food Insecurity (07/30/2024)  Hunger Vital Sign    Worried About Running Out of Food in the Last Year: Never true    Ran Out of Food in the Last Year: Never true  Transportation Needs: Unmet Transportation Needs (07/30/2024)   PRAPARE - Administrator, Civil Service (Medical): Yes    Lack of Transportation (Non-Medical): No  Physical Activity: Inactive (07/30/2024)   Exercise Vital Sign    Days of Exercise per Week: 0 days    Minutes of Exercise per Session: Not on file  Stress: Stress Concern Present (07/30/2024)   [employer] of Occupational Health - Occupational Stress Questionnaire    Feeling of Stress: To some extent  Social Connections: Moderately Integrated (07/30/2024)   Social Connection and Isolation Panel    Frequency of Communication with Friends and Family: Three times a week    Frequency of Social Gatherings with Friends and Family: Once a week    Attends Religious Services: 1 to 4 times per year    Active Member of [employer] or Organizations: No    Attends Engineer, Structural: Not on file    Marital Status: Married  Catering Manager Violence: Not At Risk (12/20/2023)   Humiliation, Afraid, Rape, and Kick questionnaire    Fear of Current or Ex-Partner: No    Emotionally Abused: No    Physically Abused: No    Sexually Abused: No    Outpatient Medications Prior to Visit  Medication Sig Dispense Refill   dextromethorphan-guaiFENesin (ROBITUSSIN-DM) 10-100 MG/5ML liquid Take 10 mLs by mouth every 6 (six) hours as needed for cough.     acetaminophen  (TYLENOL ) 500 MG tablet Take 500 mg by mouth every 6 (six)  hours as needed for moderate pain.     albuterol  (VENTOLIN  HFA) 108 (90 Base) MCG/ACT inhaler INHALE 2 PUFFS INTO THE LUNGS EVERY 6 HOURS AS NEEDED FOR WHEEZE 18 each 5   almotriptan  (AXERT ) 12.5 MG tablet Take 1 tablet (12.5 mg total) by mouth as needed for migraine. may repeat in 2 hours if needed 24 tablet 1   cyclobenzaprine  (FLEXERIL ) 10 MG tablet Take 1 tablet (10 mg total) by mouth 3 (three) times daily. 90 tablet 4   EPINEPHrine  0.3 mg/0.3 mL IJ SOAJ injection Inject 0.3 mg into the muscle as needed for anaphylaxis. 2 each 2   furosemide  (LASIX ) 20 MG tablet Take 1 tablet (20 mg total) by mouth daily. May also take 1 tablet (20 mg total) as needed for edema. 135 tablet 3   gabapentin  (NEURONTIN ) 300 MG capsule Take 2 capsules by mouth at bedtime.     HYDROcodone -acetaminophen  (NORCO/VICODIN) 5-325 MG tablet Take 1 tablet by mouth every 8 (eight) hours as needed for moderate pain (pain score 4-6). 80 tablet 0   hyoscyamine  (ANASPAZ ) 0.125 MG TBDP disintergrating tablet DISSOLVE 1 TABLET(0.125 MG) UNDER THE TONGUE EVERY 4 HOURS FOR UP TO 10 DAYS AS NEEDED 30 tablet 0   Loperamide HCl (IMODIUM A-D PO)      LORazepam  (ATIVAN ) 1 MG tablet Take 1 tablet by mouth 3 (three) times daily.     metoprolol  succinate (TOPROL  XL) 25 MG 24 hr tablet Take 1 tablet (25 mg total) by mouth daily. 90 tablet 3   nitroGLYCERIN  (NITROSTAT ) 0.4 MG SL tablet Place 1 tablet (0.4 mg total) under the tongue every 5 (five) minutes as needed for chest pain. 25 tablet 6   promethazine  (PHENERGAN ) 25 MG tablet TAKE 1/2 TABLET BY MOUTH EVERY 6 HOURS AS NEEDED FOR NAUSEA OR  VOMITING 15 tablet 0   Trospium  Chloride 60 MG CP24 Take 1 capsule by mouth every morning.      Facility-Administered Medications Prior to Visit  Medication Dose Route Frequency Provider Last Rate Last Admin   lidocaine  (XYLOCAINE ) 1 % (with pres) injection 10 mL  10 mL Other Once         Allergies:   Aripiprazole, Aspirin, Bromfenac sodium,  Chlorpromazine, Cymbalta [duloxetine hcl], Divalproex sodium, Duloxetine, Duract [bromfenac], Flurazepam, Hydrochlorothiazide, Imitrex [sumatriptan succinate], Latex, Mellaril, Metoclopramide, Olanzapine, Olanzapine, Other, Penicillins, Quetiapine, Statins, Sumatriptan, Thioridazine, Topamax, Topiramate, Trifluoperazine, Aripiprazole, Dalmane [flurazepam hcl], Darifenacin hydrobromide er, Metoclopramide hcl, Seroquel [quetiapine fumerate], Stelazine, Thorazine [chlorpromazine hcl], Allopurinol, Aspirin effervescent, Barium sulfate, Benzocaine, Biofreeze [menthol (topical analgesic)], Camphor-menthol, Capsaicin, Capsaicin-menthol, Clindamycin , Clindamycin /lincomycin, Clove oil, Colchicine, Cycloheximide, Darifenacin, Diatrizoate, Diflucan [fluconazole], Erythromycin, Garlic, Iohexol , Ivp dye [iodinated contrast media], Metronidazole, Mirabegron, Miralax [polyethylene glycol], Moxifloxacin, Nsaids, Oxybutynin, Polyethylene glycol 3350, Potassium chloride, Potassium-containing compounds, Prednisone , Psyllium, Red dye #40 (allura red), Seroquel [quetiapine fumarate], Simvastatin, Urocit - k [potassium citrate], Avelox [moxifloxacin hcl in nacl], Barium-containing compounds, Butalbital-aspirin-caffeine, Diclofenac , Doxycycline, E-mycin [erythromycin base], Flagyl [metronidazole hcl], Lamotrigine, Pregabalin, Pregabalin, Risperidone, Risperidone, and Toradol [ketorolac tromethamine]   Social History   Tobacco Use   Smoking status: Never   Smokeless tobacco: Never  Vaping Use   Vaping status: Never Used  Substance Use Topics   Alcohol  use: No   Drug use: No     Review of Systems  Constitutional:  Positive for chills and fever (100.7). Negative for malaise/fatigue.  HENT:  Positive for congestion, sinus pain and sore throat. Negative for ear pain.   Eyes: Negative.   Respiratory:  Negative for cough, shortness of breath and wheezing.   Cardiovascular:  Negative for chest pain, palpitations and leg  swelling.  Gastrointestinal:  Negative for constipation, diarrhea, nausea and vomiting.  Genitourinary:  Negative for dysuria, frequency and urgency.  Musculoskeletal: Negative.   Skin: Negative.   Neurological:  Positive for dizziness. Negative for headaches.  Endo/Heme/Allergies: Negative.   Psychiatric/Behavioral: Negative.       Labs/Other Tests and Data Reviewed:    Recent Labs: 08/10/2023: Magnesium  1.7 06/19/2024: ALT 40; BUN 15; Creatinine, Ser 1.00; Hemoglobin 13.6; Platelets 276; Potassium 4.0; Sodium 139   Recent Lipid Panel Lab Results  Component Value Date/Time   CHOL 276 (H) 08/10/2023 10:24 AM   TRIG 222 (H) 08/10/2023 10:24 AM   HDL 57 08/10/2023 10:24 AM   CHOLHDL 4.8 (H) 08/10/2023 10:24 AM   LDLCALC 177 (H) 08/10/2023 10:24 AM    Wt Readings from Last 3 Encounters:  07/30/24 153 lb 8 oz (69.6 kg)  06/27/24 151 lb (68.5 kg)  06/19/24 153 lb (69.4 kg)     Objective:    Vital Signs:  There were no vitals taken for this visit.   Physical Exam - Unable to be performed based on the nature of the visit.  ASSESSMENT & PLAN:    Symptoms of upper respiratory infection (URI) Assessment & Plan: Acute upper respiratory symptoms (sore throat, headache, sinus congestion) Acute onset of upper respiratory symptoms likely viral. No indication for antibiotics. - Continue symptomatic treatment with Robitussin and antihistamines like cetirizine or loratadine, avoiding decongestants. - Recommend albuterol  for wheezing or shortness of breath.  - Use nasal saline and humidifier for sinus congestion. - Will consult with PCP regarding antibiotic treatment. - FU in 10-14 days if symptoms are worse for antibiotic treatment at that time   Primary hypertension Assessment & Plan: Hypertension Headache possibly  related to sinus issues or blood pressure. Antihistamines without decongestants are safe. - Avoid antihistamines with decongestants. - Continue with current blood  pressure medications as prescribed.     I spent 20 minutes dedicated to the care of this patient on the date of this encounter with the patient.  Follow Up:  In Person prn  Signed,  Harrie Cedar, FNP [facility] [phone]

## 2024-08-05 NOTE — Assessment & Plan Note (Signed)
 Hypertension Headache possibly related to sinus issues or blood pressure. Antihistamines without decongestants are safe. - Avoid antihistamines with decongestants. - Continue with current blood pressure medications as prescribed.

## 2024-08-05 NOTE — Telephone Encounter (Signed)
 FYI Only or Action Required?: Action required by provider: pt requesting antibiotic as she was seen last week and does not want to come in for visit, offered virtual.  Patient was last seen in primary care on 07/30/2024 by Chandra Toribio POUR, MD.  Called Nurse Triage reporting Sinusitis.  Symptoms began yesterday.  Interventions attempted: OTC medications: Coricidin HBP Cold & Flu and Prescription medications: Albuterol  inhaler as needed.  Symptoms are: gradually worsening.  Triage Disposition: See HCP Within 4 Hours (Or PCP Triage)  Patient/caregiver understands and will follow disposition?: Yes, virtual scheduled.     Copied from CRM #[id number]. Topic: Clinical - Red Word Triage >> Aug 05, 2024  9:44 AM Larissa RAMAN wrote: [facility] that prompted transfer to Nurse Triage: Fever, chills, dizziness, sore throat, headache, b/l ear pain, vomiting, nausea. LT hand and wrist- swelling Reason for Disposition  [1] Fever > 101 F (38.3 C) AND [2] age > 60 years  Answer Assessment - Initial Assessment Questions 1. LOCATION: Where does it hurt?      Sinus pressure, congestion  4. RECURRENT SYMPTOM: Have you ever had sinus problems before? If Yes, ask: When was the last time? and What happened that time?      Yes, feels similar to sinus infections in past 5. NASAL CONGESTION: Is the nose blocked? If Yes, ask: Can you open it or must you breathe through your mouth?     Yes 7. FEVER: Do you have a fever? If Yes, ask: What is it, how was it measured, and when did it start?      101.7 this AM 8. OTHER SYMPTOMS: Do you have any other symptoms? (e.g., sore throat, cough, earache, difficulty breathing)     Diarrhea, headache, sore throat, dizziness, ear pressure  Protocols used: Sinus Pain or Congestion-A-AH

## 2024-08-05 NOTE — Assessment & Plan Note (Addendum)
 Acute upper respiratory symptoms (sore throat, headache, sinus congestion) Acute onset of upper respiratory symptoms likely viral. No indication for antibiotics. - Continue symptomatic treatment with Robitussin and antihistamines like cetirizine or loratadine, avoiding decongestants. - Recommend albuterol  for wheezing or shortness of breath.  - Use nasal saline and humidifier for sinus congestion. - Will consult with PCP regarding antibiotic treatment. - FU in 10-14 days if symptoms are worse for antibiotic treatment at that time

## 2024-08-06 ENCOUNTER — Other Ambulatory Visit: Payer: Self-pay | Admitting: Family Medicine

## 2024-08-06 ENCOUNTER — Ambulatory Visit: Payer: Self-pay | Admitting: *Deleted

## 2024-08-06 NOTE — Telephone Encounter (Signed)
 Called pt she is advised and agreeable to the recommendation

## 2024-08-06 NOTE — Telephone Encounter (Signed)
 I reviewed the note from her video visit yesterday - they did not not prescribe antibiotics because it was through video, they did not prescribe antibiotics because her symptoms only started 2 days ago.  Antibiotics are only indicated if symptoms have persisted greater than 10 days.  If she is still not better in 1 week, then she can reach out and we will send in antibiotics, but for now, they are not indicated.   She should continue with all of her supportive care measures for now.

## 2024-08-06 NOTE — Telephone Encounter (Signed)
 FYI Only or Action Required?: Action required by provider: request new Rx.  Patient was last seen in primary care on 08/05/2024 by Teressa Harrie HERO, FNP.  Called Nurse Triage reporting New Med Request.  Symptoms began several days ago.  Interventions attempted: Prescription medications: see note.  Symptoms are: gradually worsening.  Triage Disposition: See PCP When Office is Open (Within 3 Days)  Patient/caregiver understands and will follow disposition?: No, wishes to speak with PCP  Copied from CRM #[id number]. Topic: Clinical - Red Word Triage >> Aug 05, 2024  9:44 AM Larissa RAMAN wrote: [facility] that prompted transfer to Nurse Triage: Fever, chills, dizziness, sore throat, headache, b/l ear pain, vomiting, nausea. LT hand and wrist- swelling >> Aug 06, 2024 10:48 AM Montie POUR wrote: Donni is calling back to see if Dr. Chandra would call her in an antibiotic. She did complete a video visit with FNP Teressa yesterday and she would not give her an antibiotic because the visit was not in person. All her symptoms are the same as above. She only has 1 kidney and she really feels she needs an antibiotic to get rid of the issues. I lost her and I tried to call back and got voicemail.  Reason for Disposition . Prescription request for new medicine (not a refill)  Answer Assessment - Initial Assessment Questions 1. NAME of MEDICINE: What medicine(s) are you calling about?     Patient is requesting antibiotic treatment for her symptoms- Levaquin  2. QUESTION: What is your question? (e.g., double dose of medicine, side effect)     Acute Sinusitis diagnosis in the past 3. PRESCRIBER: Who prescribed the medicine? Reason: if prescribed by specialist, call should be referred to that group.     PCP- patient has had VV with [facility] and they did not feel comfortable prescribing with symptoms only present 2-3 days 4. SYMPTOMS: Do you have any symptoms? If Yes, ask: What symptoms are you having?  How bad are  the symptoms (e.g., mild, moderate, severe)     Fever- 100.7, chills, dizziness, sore throat, headache, b/l ear pain, vomiting, nausea. LT hand and wrist- swelling  Patient states she is taking half pill of phenergan , heating pad, cold packs, decreased pain medication dosing, patient is trying to isolate as provider as instructed due to her decreased immune system. Taking coricidin, imodium multi symptoms, albuterol . Patient is afraid to drive for in person appointment. Patient states she doesn't want PCP to prescribe her medication- then she understands- she just can't make it to office and her insurance doesn't cover VV anymore.  Protocols used: Medication Question Call-A-AH

## 2024-08-15 ENCOUNTER — Encounter: Admitting: Registered Nurse

## 2024-08-18 ENCOUNTER — Encounter: Admitting: Registered Nurse

## 2024-08-18 ENCOUNTER — Ambulatory Visit: Payer: Self-pay

## 2024-08-18 NOTE — Telephone Encounter (Signed)
 FYI Only or Action Required?: Action required by provider: clinical question for provider and antibiotic request .  Patient was last seen in primary care on 08/05/2024 by Teressa Harrie HERO, FNP.  Called Nurse Triage reporting Fever and Influenza.  Symptoms began several days ago.  Interventions attempted: OTC medications: Coricidin, Tylenol , promethazine  and Rest, hydration, or home remedies.  Symptoms are: gradually worsening.  Triage Disposition: See Physician Within 24 Hours  Patient/caregiver understands and will follow disposition?: No, wishes to speak with PCP     Copied from CRM #[id number]. 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 Had Telehealth on 08/05/24 no abx prescribed at that time.  Refusing urgent care, however states if she gets worse then she will go to urgent care.   1. SYMPTOMS: What is your main symptom or concern? (e.g., cough, fever, shortness of breath, muscle aches)     Congestion, fever  2. ONSET: When did the symptoms start?      About two weeks ago, worsening since Friday.  3. COUGH: Do you have a cough? If Yes, ask: How bad is the cough?       Yes-mild-no cough observed on this call 4. FEVER: Do you have a fever? If Yes, ask: What is your temperature, how was it measured, and when did it  start?     101.7 5. BREATHING DIFFICULTY: Are you having any difficulty breathing? (e.g., normal; shortness of breath, wheezing, unable to speak)      None reported, speaking in full sentences  6. BETTER-SAME-WORSE: Are you getting better, staying the same or getting worse compared to yesterday?  If getting worse, ask, In what way?     worse 7. OTHER SYMPTOMS: Do you have any other symptoms?  (e.g., chills, fatigue, headache, loss of smell or taste, muscle pain, sore throat)     Sore throat,ear pain, headache, chest congestion, diarrhea, vertigo, bloody nose  8. INFLUENZA EXPOSURE: Was there any known exposure to influenza (flu) before the symptoms began?      unsure 9. INFLUENZA SUSPECTED: Why do you think you have influenza? (e.g., positive flu self-test at home, symptoms after exposure).     Flu like symptoms 10. INFLUENZA VACCINE: Have you had the flu vaccine? If Yes, ask: When did you last get it?       Has not received 2025 season 11. HIGH RISK FOR COMPLICATIONS: Do you have any chronic medical problems? (e.g., asthma, heart or lung disease, obesity, weak immune system)  Answer Assessment - Initial Assessment Questions 1. AMOUNT OF BLEEDING: How bad is the bleeding? How much blood was lost? Has the bleeding stopped?     Just started on call 2. ONSET: When did the nosebleed start?      At start of call 3. FREQUENCY: How many nosebleeds have you had in the last 24 hours?  one 4. RECURRENT SYMPTOMS: Have there been other recent nosebleeds? If Yes, ask: How long did it take you to stop the bleeding? What worked best?       5. CAUSE: What do you think caused this nosebleed?     irritation 6. LOCAL FACTORS: Do you have any cold symptoms?, Have you been rubbing or picking at your nose?     Flu like symptoms 7. SYSTEMIC FACTORS: Do you have high blood pressure or any bleeding problems?      8. BLOOD THINNERS: Do you take any blood  thinners? (e.g., aspirin, clopidogrel / Plavix, coumadin, heparin). Notes: Other strong blood thinners include: Arixtra (fondaparinux), Eliquis (apixaban), Pradaxa (dabigatran), and Xarelto (rivaroxaban).      9. OTHER SYMPTOMS: Do you have any other symptoms? (e.g., lightheadedness)     See flu protocol 10. PREGNANCY: Is there any chance you are pregnant? When was your last menstrual period?  Protocols used: Influenza (Flu) Suspected-A-AH, Nosebleed-A-AH

## 2024-08-19 ENCOUNTER — Ambulatory Visit: Admitting: Neurology

## 2024-08-19 ENCOUNTER — Ambulatory Visit: Payer: Self-pay

## 2024-08-19 NOTE — Telephone Encounter (Signed)
 Pt refused visit at regional office. Would prefer to go to UC closer to home.   FYI Only or Action Required?: FYI only for provider: UC.  Patient was last seen in primary care on 08/05/2024 by Teressa Harrie HERO, FNP.  Called Nurse Triage reporting URI.  Symptoms began several days ago.  Interventions attempted: Rest, hydration, or home remedies.  Symptoms are: unchanged.  Triage Disposition: See HCP Within 4 Hours (Or PCP Triage)  Patient/caregiver understands and will follow disposition?: Yes  Reason for Disposition  [1] Fever > 100 F (37.8 C) AND [2] bedridden (e.g., CVA, chronic illness, recovering from surgery)  Answer Assessment - Initial Assessment Questions Reports temp of 101.7, nausea, diarrhea, coughing, congestion, sore throat. Speaking in clear and full sentences. States called yesterday for epistaxis, resolved.  1. TEMPERATURE: What is the most recent temperature?  How was it measured?      101.7 2. ONSET: When did the fever start?      5 days 3. OTHER SYMPTOMS: Do you have any other symptoms besides the fever?  (e.g., abdomen pain, cough, diarrhea, earache, headache, sore throat, urination pain)     Vomiting, diarrhea, fever, sore throat 4. CAUSE: If there are no symptoms, ask: What do you think is causing the fever?      Unknown 5. IMMUNOCOMPROMISE: Do you have any of the following: diabetes, HIV positive, splenectomy, cancer chemotherapy, chronic steroid treatment, transplant patient, etc.?     Cancer  Protocols used: [facility]  Copied from CRM [id number]. 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   Past Medical History:  Diagnosis Date   Anxiety    Arthritis    Asthma    Bipolar 2 disorder (HCC)    pt stated, I have Bipolar 2 and it is remission   Bipolar affective (HCC)    CAD (coronary artery disease), native artery transplanted heart    Nonobstructive by coronary CTA 08/2021 with less than 25% stenosis in the LAD, RCA and left circumflex.   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   Full dentures    Gout    Headache(784.0)    migraines   Herpesviral infection    Hypertension    IBS (irritable bowel syndrome)    with diarrhea   Lymphadenopathy    Myalgia and myositis, unspecified    Nephrolithiasis 02/11/2013   PAT (paroxysmal atrial tachycardia)    asymptomatic 10 beat run on event monitor 09/2020   PFO (patent foramen ovale)    Small PFO noted on coronary CTA   Pneumonia    PONV (postoperative nausea and vomiting)    PTSD (post-traumatic stress disorder)    Renal calculi  Restless legs syndrome (RLS)    Thoracic radiculopathy    Vitamin D  deficiency    Wears glasses   -Single kidney

## 2024-08-20 ENCOUNTER — Encounter: Attending: Physical Medicine & Rehabilitation | Admitting: Registered Nurse

## 2024-08-20 ENCOUNTER — Other Ambulatory Visit: Payer: Self-pay | Admitting: Family Medicine

## 2024-08-20 ENCOUNTER — Other Ambulatory Visit

## 2024-08-20 DIAGNOSIS — K589 Irritable bowel syndrome without diarrhea: Secondary | ICD-10-CM

## 2024-08-20 NOTE — Telephone Encounter (Signed)
 Patient called to reschedule her appt for 11/24. She is requesting a refill of her medications on 11/21

## 2024-08-20 NOTE — Telephone Encounter (Unsigned)
 Copied from CRM [id number]. Topic: Clinical - Medication Refill >> Aug 20, 2024  8:31 AM Revonda D wrote: Medication: promethazine  (PHENERGAN ) 25 MG tablet, levofloxacin  (LEVAQUIN ) 500 MG tablet,hyoscyamine  (ANASPAZ ) 0.125 MG TBDP disintergrating tablet  Has the patient contacted their pharmacy? Yes (Agent: If no, request that the patient contact the pharmacy for the refill. If patient does not wish to contact the pharmacy document the reason why and proceed with request.) (Agent: If yes, when and what did the pharmacy advise?)  This is the patient's preferred pharmacy:  [facility] [address] [place] - [address] AT [address] [place] [address] Phone: [phone] Fax: [phone]  Is this the correct pharmacy for this prescription? Yes If no, delete pharmacy and type the correct one.   Has the prescription been filled recently? No  Is the patient out of the medication? No  Has the patient been seen for an appointment in the last year OR does the patient have an upcoming appointment? Yes  Can we respond through MyChart? No  Agent: Please be advised that Rx refills may take up to 3 business days. We ask that you follow-up with your pharmacy.

## 2024-08-20 NOTE — Progress Notes (Deleted)
 Subjective:    Patient ID: Patty Salas, female    DOB: 11-21-1955, 68 y.o.   MRN: [id number]  HPI   Pain Inventory Average Pain {NUMBERS; 0-10:5044} Pain Right Now {NUMBERS; 0-10:5044} My pain is {PAIN DESCRIPTION:21022940}  In the last 24 hours, has pain interfered with the following? General activity {NUMBERS; 0-10:5044} Relation with others {NUMBERS; 0-10:5044} Enjoyment of life {NUMBERS; 0-10:5044} What TIME of day is your pain at its worst? {time of day:24191} Sleep (in general) {BHH GOOD/FAIR/POOR:22877}  Pain is worse with: {ACTIVITIES:21022942} Pain improves with: {PAIN IMPROVES TPUY:78977056} Relief from Meds: {NUMBERS; 0-10:5044}  Family History  Problem Relation Age of Onset   Cancer Mother        stomach   Migraines Mother    Arthritis Mother    Emphysema Mother    Heart disease Father    Hypertension Father    Cancer Father        colon   Dementia Father    Hypertension Brother    Migraines Brother    Hypertension Brother    Migraines Brother    Alcohol  abuse Brother    Bipolar disorder Brother    Migraines Daughter    Arthritis Maternal Grandmother    Cancer Maternal Grandmother        stomach   Diabetes Maternal Grandmother    Stroke Maternal Grandmother    Cancer Maternal Aunt        lung   Emphysema Maternal Aunt    Cancer Paternal Uncle        colon   Hypertension Maternal Aunt    Heart disease Maternal Aunt    Cancer Paternal Uncle        lung   Social History   Socioeconomic History   Marital status: Married    Spouse name: Not on file   Number of children: 1   Years of education: COLLEGE1   Highest education level: Some college, no degree  Occupational History   Occupation: HOUSEWIFE    Associate Professor: UNEMPLOYED  Tobacco Use   Smoking status: Never   Smokeless tobacco: Never  Vaping Use   Vaping status: Never Used  Substance and Sexual Activity   Alcohol  use: No   Drug use: No   Sexual activity: Not on file  Other  Topics Concern   Not on file  Social History Narrative   Patient is right handed.   Patient drinks caffeine occasionally.   Social Drivers of Health   Financial Resource Strain: Patient Declined (07/30/2024)   Overall Financial Resource Strain (CARDIA)    Difficulty of Paying Living Expenses: Patient declined  Food Insecurity: No Food Insecurity (07/30/2024)   Hunger Vital Sign    Worried About Running Out of Food in the Last Year: Never true    Ran Out of Food in the Last Year: Never true  Transportation Needs: Unmet Transportation Needs (07/30/2024)   PRAPARE - Administrator, Civil Service (Medical): Yes    Lack of Transportation (Non-Medical): No  Physical Activity: Inactive (07/30/2024)   Exercise Vital Sign    Days of Exercise per Week: 0 days    Minutes of Exercise per Session: Not on file  Stress: Stress Concern Present (07/30/2024)   [employer] of Occupational Health - Occupational Stress Questionnaire    Feeling of Stress: To some extent  Social Connections: Moderately Integrated (07/30/2024)   Social Connection and Isolation Panel    Frequency of Communication with Friends and Family: Three times a week  Frequency of Social Gatherings with Friends and Family: Once a week    Attends Religious Services: 1 to 4 times per year    Active Member of [employer] or Organizations: No    Attends Engineer, Structural: Not on file    Marital Status: Married   Past Surgical History:  Procedure Laterality Date    kidney stones     ABDOMINAL ADHESION SURGERY     ABDOMINAL HYSTERECTOMY  1995   partial- hemmoraged after surgery-stitch came loose   APPENDECTOMY  1993   hemmoraged after surgery- stitch came loose   CERVICAL DISC SURGERY  06/04/2001   with fusion   CHOLECYSTECTOMY  1985   colonoscopy     COLONOSCOPY     CYSTOSCOPY     FOOT SURGERY     lt   INCISIONAL HERNIA REPAIR N/A 04/22/2021   Procedure: LAPAROSCOPIC INCISIONAL HERNIA REPAIR WITH  MESH;  Surgeon: Signe Mitzie LABOR, MD;  Location: [facility];  Service: General;  Laterality: N/A;   jj stent  07/2002   with ureteroscopy, cystoscopy and then removal of stent in office   LAPAROSCOPIC LYSIS OF ADHESIONS N/A 04/22/2021   Procedure: LYSIS OF ADHESIONS;  Surgeon: Signe Mitzie LABOR, MD;  Location: [facility];  Service: General;  Laterality: N/A;   LITHOTRIPSY     LYMPH NODE BIOPSY Right 03/06/2014   Procedure: right groin LYMPH NODE BIOPSY;  Surgeon: Deward GORMAN Curvin DOUGLAS, MD;  Location: [facility];  Service: General;  Laterality: Right;   LYMPHADENECTOMY N/A 12/29/2015   Procedure: RETROPERITONEAL LYMPHADENECTOMY;  Surgeon: Ricardo Likens, MD;  Location: [facility];  Service: Urology;  Laterality: N/A;   NECK SURGERY  2002   ROBOT ASSISTED LAPAROSCOPIC NEPHRECTOMY Left 12/29/2015   Procedure: XI ROBOTIC ASSISTED LEFT LAPAROSCOPIC NEPHRECTOMY;  Surgeon: Ricardo Likens, MD;  Location: [facility];  Service: Urology;  Laterality: Left;   TONSILLECTOMY  1976   Past Surgical History:  Procedure Laterality Date    kidney stones     ABDOMINAL ADHESION SURGERY     ABDOMINAL HYSTERECTOMY  1995   partial- hemmoraged after surgery-stitch came loose   APPENDECTOMY  1993   hemmoraged after surgery- stitch came loose   CERVICAL DISC SURGERY  06/04/2001   with fusion   CHOLECYSTECTOMY  1985   colonoscopy     COLONOSCOPY     CYSTOSCOPY     FOOT SURGERY     lt   INCISIONAL HERNIA REPAIR N/A 04/22/2021   Procedure: LAPAROSCOPIC INCISIONAL HERNIA REPAIR WITH MESH;  Surgeon: Signe Mitzie LABOR, MD;  Location: [facility];  Service: General;  Laterality: N/A;   jj stent  07/2002   with ureteroscopy, cystoscopy and then removal of stent in office   LAPAROSCOPIC LYSIS OF ADHESIONS N/A 04/22/2021   Procedure: LYSIS OF ADHESIONS;  Surgeon: Signe Mitzie LABOR, MD;  Location: [facility];  Service: General;  Laterality: N/A;   LITHOTRIPSY     LYMPH NODE BIOPSY Right 03/06/2014   Procedure: right groin LYMPH NODE  BIOPSY;  Surgeon: Deward GORMAN Curvin DOUGLAS, MD;  Location: [facility];  Service: General;  Laterality: Right;   LYMPHADENECTOMY N/A 12/29/2015   Procedure: RETROPERITONEAL LYMPHADENECTOMY;  Surgeon: Ricardo Likens, MD;  Location: [facility];  Service: Urology;  Laterality: N/A;   NECK SURGERY  2002   ROBOT ASSISTED LAPAROSCOPIC NEPHRECTOMY Left 12/29/2015   Procedure: XI ROBOTIC ASSISTED LEFT LAPAROSCOPIC NEPHRECTOMY;  Surgeon: Ricardo Likens, MD;  Location: [facility];  Service: Urology;  Laterality:  Left;   TONSILLECTOMY  1976   Past Medical History:  Diagnosis Date   Anxiety    Arthritis    Asthma    Bipolar 2 disorder (HCC)    pt stated, I have Bipolar 2 and it is remission   Bipolar affective (HCC)    CAD (coronary artery disease), native artery transplanted heart    Nonobstructive by coronary CTA 08/2021 with less than 25% stenosis in the LAD, RCA and left circumflex.   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   Full dentures    Gout    Headache(784.0)    migraines   Herpesviral infection    Hypertension    IBS (irritable bowel syndrome)    with diarrhea   Lymphadenopathy    Myalgia and myositis, unspecified    Nephrolithiasis 02/11/2013   PAT (paroxysmal atrial tachycardia)    asymptomatic 10 beat run on event monitor 09/2020   PFO (patent foramen ovale)    Small PFO noted on coronary CTA   Pneumonia    PONV (postoperative nausea and vomiting)    PTSD (post-traumatic stress disorder)    Renal calculi    Restless legs syndrome (RLS)    Thoracic radiculopathy    Vitamin D  deficiency    Wears glasses    There were no vitals taken for this visit.  Opioid Risk Score:   Fall Risk Score:  `1  Depression screen PHQ 2/9      07/30/2024    2:22 PM 06/27/2024    9:50 AM 04/18/2024   12:22 PM 02/19/2024    9:57 AM 01/16/2024   11:12 AM 10/11/2023   11:49 AM 08/27/2023    1:54 PM  Depression screen PHQ 2/9  Decreased Interest 0 1 0 0 0 1 0  Down, Depressed, Hopeless 0 1 0 0 0 1 0  PHQ - 2 Score 0 2 0 0 0 2 0  Altered sleeping 0  0 1 0    Tired, decreased energy 2  0 0 0    Change in appetite 1  0 0 0    Feeling bad or failure about yourself  0  0 0 0  0  Trouble concentrating 1  0 0 0    Moving slowly or fidgety/restless 1  0 0 1    Suicidal thoughts 0  0 0 0    PHQ-9 Score 5   0  1  1     Difficult doing work/chores   Somewhat difficult Somewhat difficult Not difficult at all       Data saved with a previous flowsheet row definition    Review of Systems     Objective:   Physical Exam        Assessment & Plan:

## 2024-08-21 ENCOUNTER — Telehealth: Payer: Self-pay | Admitting: Registered Nurse

## 2024-08-21 DIAGNOSIS — G894 Chronic pain syndrome: Secondary | ICD-10-CM

## 2024-08-21 DIAGNOSIS — G8929 Other chronic pain: Secondary | ICD-10-CM

## 2024-08-21 DIAGNOSIS — M542 Cervicalgia: Secondary | ICD-10-CM

## 2024-08-21 MED ORDER — HYDROCODONE-ACETAMINOPHEN 5-325 MG PO TABS
1.0000 | ORAL_TABLET | Freq: Three times a day (TID) | ORAL | 0 refills | Status: DC | PRN
Start: 2024-08-21 — End: 2024-08-25

## 2024-08-21 MED ORDER — HYOSCYAMINE SULFATE 0.125 MG PO TBDP: ORAL_TABLET | ORAL | 0 refills | Status: DC

## 2024-08-21 MED ORDER — PROMETHAZINE HCL 25 MG PO TABS: ORAL_TABLET | ORAL | 0 refills | Status: DC

## 2024-08-21 NOTE — Telephone Encounter (Signed)
 PDMP was Reviewed. Hydrocodone  e-scribed to pharmacy Ms. Mischke is aware via My-Chart message.

## 2024-08-25 ENCOUNTER — Encounter: Payer: Self-pay | Admitting: Registered Nurse

## 2024-08-25 ENCOUNTER — Encounter: Attending: Physical Medicine & Rehabilitation | Admitting: Registered Nurse

## 2024-08-25 VITALS — BP 130/76 | HR 95 | Ht 65.5 in | Wt 150.3 lb

## 2024-08-25 DIAGNOSIS — M7918 Myalgia, other site: Secondary | ICD-10-CM | POA: Insufficient documentation

## 2024-08-25 DIAGNOSIS — M7061 Trochanteric bursitis, right hip: Secondary | ICD-10-CM | POA: Diagnosis present

## 2024-08-25 DIAGNOSIS — M5412 Radiculopathy, cervical region: Secondary | ICD-10-CM | POA: Diagnosis not present

## 2024-08-25 DIAGNOSIS — M25562 Pain in left knee: Secondary | ICD-10-CM | POA: Insufficient documentation

## 2024-08-25 DIAGNOSIS — M7062 Trochanteric bursitis, left hip: Secondary | ICD-10-CM | POA: Diagnosis present

## 2024-08-25 DIAGNOSIS — M5416 Radiculopathy, lumbar region: Secondary | ICD-10-CM | POA: Insufficient documentation

## 2024-08-25 DIAGNOSIS — M47812 Spondylosis without myelopathy or radiculopathy, cervical region: Secondary | ICD-10-CM | POA: Diagnosis not present

## 2024-08-25 DIAGNOSIS — G894 Chronic pain syndrome: Secondary | ICD-10-CM | POA: Insufficient documentation

## 2024-08-25 DIAGNOSIS — G43519 Persistent migraine aura without cerebral infarction, intractable, without status migrainosus: Secondary | ICD-10-CM | POA: Insufficient documentation

## 2024-08-25 DIAGNOSIS — M797 Fibromyalgia: Secondary | ICD-10-CM | POA: Insufficient documentation

## 2024-08-25 DIAGNOSIS — G8929 Other chronic pain: Secondary | ICD-10-CM | POA: Diagnosis present

## 2024-08-25 DIAGNOSIS — M25561 Pain in right knee: Secondary | ICD-10-CM | POA: Insufficient documentation

## 2024-08-25 DIAGNOSIS — M546 Pain in thoracic spine: Secondary | ICD-10-CM | POA: Insufficient documentation

## 2024-08-25 DIAGNOSIS — M542 Cervicalgia: Secondary | ICD-10-CM | POA: Insufficient documentation

## 2024-08-25 MED ORDER — ALMOTRIPTAN MALATE 12.5 MG PO TABS: 12.5000 mg | ORAL_TABLET | ORAL | 1 refills | Status: AC | PRN

## 2024-08-25 MED ORDER — HYDROCODONE-ACETAMINOPHEN 5-325 MG PO TABS: 1.0000 | ORAL_TABLET | Freq: Three times a day (TID) | ORAL | 0 refills | Status: AC | PRN

## 2024-08-25 MED ORDER — CYCLOBENZAPRINE HCL 10 MG PO TABS: 10.0000 mg | ORAL_TABLET | Freq: Three times a day (TID) | ORAL | 4 refills | Status: AC

## 2024-08-25 MED ORDER — HYDROCODONE-ACETAMINOPHEN 5-325 MG PO TABS: 1.0000 | ORAL_TABLET | Freq: Three times a day (TID) | ORAL | 0 refills | Status: DC | PRN

## 2024-08-25 NOTE — Progress Notes (Signed)
 Subjective:    Patient ID: Patty Salas, female    DOB: 10/15/1955, 68 y.o.   MRN: [id number]  HPI: Patty Salas is a 68 y.o. female who returns for follow up appointment for chronic pain and medication refill. She states her pain is located in her neck radiating into her bilateral shoulders, mid- lower back , bilateral hips and bilateral lower extremities. Also reports bilateral knee pain L>R and generalized joint pain. She rates her pain 9. Her current exercise regime is walking and performing stretching exercises.  [name] Morphine equivalent is 15.38 MME. She is also prescribed Lorazepam  by Dr. Jess .We have discussed the black box warning of using opioids and benzodiazepines. I highlighted the dangers of using these drugs together and discussed the adverse events including respiratory suppression, overdose, cognitive impairment and importance of compliance with current regimen. We will continue to monitor and adjust as indicated.  she is being closely monitored and under the care of her psychiatrist.     Pain Inventory Average Pain 7 Pain Right Now 9 My pain is constant, sharp, burning, stabbing, tingling, and aching  In the last 24 hours, has pain interfered with the following? General activity 8 Relation with others 7 Enjoyment of life 8 What TIME of day is your pain at its worst? morning , daytime, evening, and night Sleep (in general) Fair to Good  Pain is worse with: walking, bending, sitting, inactivity, standing, and some activites Pain improves with: rest, heat/ice, therapy/exercise, medication, and injections Relief from Meds: 6  Family History  Problem Relation Age of Onset   Cancer Mother        stomach   Migraines Mother    Arthritis Mother    Emphysema Mother    Heart disease Father    Hypertension Father    Cancer Father        colon   Dementia Father    Hypertension Brother    Migraines Brother    Hypertension Brother    Migraines Brother     Alcohol  abuse Brother    Bipolar disorder Brother    Migraines Daughter    Arthritis Maternal Grandmother    Cancer Maternal Grandmother        stomach   Diabetes Maternal Grandmother    Stroke Maternal Grandmother    Cancer Maternal Aunt        lung   Emphysema Maternal Aunt    Cancer Paternal Uncle        colon   Hypertension Maternal Aunt    Heart disease Maternal Aunt    Cancer Paternal Uncle        lung   Social History   Socioeconomic History   Marital status: Married    Spouse name: Not on file   Number of children: 1   Years of education: COLLEGE1   Highest education level: Some college, no degree  Occupational History   Occupation: HOUSEWIFE    Associate Professor: UNEMPLOYED  Tobacco Use   Smoking status: Never   Smokeless tobacco: Never  Vaping Use   Vaping status: Never Used  Substance and Sexual Activity   Alcohol  use: No   Drug use: No   Sexual activity: Not on file  Other Topics Concern   Not on file  Social History Narrative   Patient is right handed.   Patient drinks caffeine occasionally.   Social Drivers of Health   Financial Resource Strain: Patient Declined (07/30/2024)   Overall Financial Resource Strain (CARDIA)  Difficulty of Paying Living Expenses: Patient declined  Food Insecurity: No Food Insecurity (07/30/2024)   Hunger Vital Sign    Worried About Running Out of Food in the Last Year: Never true    Ran Out of Food in the Last Year: Never true  Transportation Needs: Unmet Transportation Needs (07/30/2024)   PRAPARE - Administrator, Civil Service (Medical): Yes    Lack of Transportation (Non-Medical): No  Physical Activity: Inactive (07/30/2024)   Exercise Vital Sign    Days of Exercise per Week: 0 days    Minutes of Exercise per Session: Not on file  Stress: Stress Concern Present (07/30/2024)   [employer] of Occupational Health - Occupational Stress Questionnaire    Feeling of Stress: To some extent  Social  Connections: Moderately Integrated (07/30/2024)   Social Connection and Isolation Panel    Frequency of Communication with Friends and Family: Three times a week    Frequency of Social Gatherings with Friends and Family: Once a week    Attends Religious Services: 1 to 4 times per year    Active Member of [employer] or Organizations: No    Attends Engineer, Structural: Not on file    Marital Status: Married   Past Surgical History:  Procedure Laterality Date    kidney stones     ABDOMINAL ADHESION SURGERY     ABDOMINAL HYSTERECTOMY  1995   partial- hemmoraged after surgery-stitch came loose   APPENDECTOMY  1993   hemmoraged after surgery- stitch came loose   CERVICAL DISC SURGERY  06/04/2001   with fusion   CHOLECYSTECTOMY  1985   colonoscopy     COLONOSCOPY     CYSTOSCOPY     FOOT SURGERY     lt   INCISIONAL HERNIA REPAIR N/A 04/22/2021   Procedure: LAPAROSCOPIC INCISIONAL HERNIA REPAIR WITH MESH;  Surgeon: Signe Mitzie LABOR, MD;  Location: [facility];  Service: General;  Laterality: N/A;   jj stent  07/2002   with ureteroscopy, cystoscopy and then removal of stent in office   LAPAROSCOPIC LYSIS OF ADHESIONS N/A 04/22/2021   Procedure: LYSIS OF ADHESIONS;  Surgeon: Signe Mitzie LABOR, MD;  Location: [facility];  Service: General;  Laterality: N/A;   LITHOTRIPSY     LYMPH NODE BIOPSY Right 03/06/2014   Procedure: right groin LYMPH NODE BIOPSY;  Surgeon: Deward GORMAN Curvin DOUGLAS, MD;  Location: [facility];  Service: General;  Laterality: Right;   LYMPHADENECTOMY N/A 12/29/2015   Procedure: RETROPERITONEAL LYMPHADENECTOMY;  Surgeon: Ricardo Likens, MD;  Location: [facility];  Service: Urology;  Laterality: N/A;   NECK SURGERY  2002   ROBOT ASSISTED LAPAROSCOPIC NEPHRECTOMY Left 12/29/2015   Procedure: XI ROBOTIC ASSISTED LEFT LAPAROSCOPIC NEPHRECTOMY;  Surgeon: Ricardo Likens, MD;  Location: [facility];  Service: Urology;  Laterality: Left;   TONSILLECTOMY  1976   Past Surgical History:   Procedure Laterality Date    kidney stones     ABDOMINAL ADHESION SURGERY     ABDOMINAL HYSTERECTOMY  1995   partial- hemmoraged after surgery-stitch came loose   APPENDECTOMY  1993   hemmoraged after surgery- stitch came loose   CERVICAL DISC SURGERY  06/04/2001   with fusion   CHOLECYSTECTOMY  1985   colonoscopy     COLONOSCOPY     CYSTOSCOPY     FOOT SURGERY     lt   INCISIONAL HERNIA REPAIR N/A 04/22/2021   Procedure: LAPAROSCOPIC INCISIONAL HERNIA REPAIR WITH MESH;  Surgeon: Signe,  Mitzie LABOR, MD;  Location: [facility];  Service: General;  Laterality: N/A;   jj stent  07/2002   with ureteroscopy, cystoscopy and then removal of stent in office   LAPAROSCOPIC LYSIS OF ADHESIONS N/A 04/22/2021   Procedure: LYSIS OF ADHESIONS;  Surgeon: Signe Mitzie LABOR, MD;  Location: [facility];  Service: General;  Laterality: N/A;   LITHOTRIPSY     LYMPH NODE BIOPSY Right 03/06/2014   Procedure: right groin LYMPH NODE BIOPSY;  Surgeon: Deward GORMAN Curvin DOUGLAS, MD;  Location: [facility];  Service: General;  Laterality: Right;   LYMPHADENECTOMY N/A 12/29/2015   Procedure: RETROPERITONEAL LYMPHADENECTOMY;  Surgeon: Ricardo Likens, MD;  Location: [facility];  Service: Urology;  Laterality: N/A;   NECK SURGERY  2002   ROBOT ASSISTED LAPAROSCOPIC NEPHRECTOMY Left 12/29/2015   Procedure: XI ROBOTIC ASSISTED LEFT LAPAROSCOPIC NEPHRECTOMY;  Surgeon: Ricardo Likens, MD;  Location: [facility];  Service: Urology;  Laterality: Left;   TONSILLECTOMY  1976   Past Medical History:  Diagnosis Date   Anxiety    Arthritis    Asthma    Bipolar 2 disorder (HCC)    pt stated, I have Bipolar 2 and it is remission   Bipolar affective (HCC)    CAD (coronary artery disease), native artery transplanted heart    Nonobstructive by coronary CTA 08/2021 with less than 25% stenosis in the LAD, RCA and left circumflex.   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   Full dentures    Gout    Headache(784.0)    migraines   Herpesviral infection    Hypertension    IBS (irritable bowel syndrome)    with diarrhea   Lymphadenopathy    Myalgia and myositis, unspecified    Nephrolithiasis 02/11/2013   PAT (paroxysmal atrial tachycardia)    asymptomatic 10 beat run on event monitor 09/2020   PFO (patent foramen ovale)    Small PFO noted on coronary CTA   Pneumonia    PONV (postoperative nausea and vomiting)    PTSD (post-traumatic stress disorder)    Renal calculi    Restless legs syndrome (RLS)    Thoracic radiculopathy    Vitamin D  deficiency    Wears glasses    BP 130/76 (BP Location: Left Arm, Patient Position: Sitting, Cuff Size: Normal)   Pulse 95   Ht 5' 5.5 (1.664 m)   Wt 150 lb 4.8 oz (68.2 kg)   SpO2 95%   BMI 24.63 kg/m   Opioid Risk Score:   Fall Risk Score:  `1  Depression screen PHQ 2/9     07/30/2024    2:22 PM 06/27/2024    9:50 AM 04/18/2024   12:22 PM 02/19/2024    9:57 AM 01/16/2024   11:12 AM 10/11/2023   11:49 AM 08/27/2023    1:54 PM  Depression screen PHQ 2/9  Decreased Interest 0 1 0 0 0 1 0  Down, Depressed, Hopeless 0 1 0 0 0 1 0  PHQ - 2 Score 0 2 0 0 0 2 0  Altered sleeping 0  0 1 0    Tired, decreased energy 2  0 0 0    Change in appetite 1  0 0 0    Feeling bad or failure about yourself  0  0 0 0  0  Trouble concentrating 1  0 0 0    Moving slowly or fidgety/restless 1  0 0 1    Suicidal thoughts 0  0 0 0    PHQ-9 Score 5   0  1  1     Difficult doing work/chores   Somewhat difficult Somewhat difficult Not difficult at all       Data saved with a previous flowsheet row definition     Review of Systems  Musculoskeletal:  Positive for arthralgias, back pain, myalgias and neck pain.       Bilateral shoulder, arm, hip, knee,  foot, leg pain (widespread myalgia's and arthralgia)  All other systems reviewed and are negative.      Objective:   Physical Exam Vitals and nursing note reviewed.  Constitutional:      Appearance: Normal appearance.  Neck:     Comments: Cervical Paraspinal Tenderness: C-5-C-6 Cardiovascular:     Rate and Rhythm: Normal rate and regular rhythm.  Pulmonary:     Effort: Pulmonary effort is normal.     Breath sounds: Normal breath sounds.  Musculoskeletal:     Comments: Normal Muscle Bulk and Muscle Testing Reveals:  Upper Extremities: Full ROM and Muscle Strength 5/5 Bilateral AC Joint Tenderness   Thoracic and  Lumbar Hypersensitivity Bilateral Greater Trochanter Tenderness Lower Extremities: Full ROM and Muscle Strength 5/5 Left Lower Extremity Flexion Produces Pain into her Left Lower Extremity Arises from Table slowly using walker for support Narrow Based  Gait     Skin:    General: Skin is warm and dry.  Neurological:     Mental Status: She is alert and oriented to person, place, and time.  Psychiatric:        Mood and Affect: Mood normal.        Behavior: Behavior normal.          Assessment & Plan:  History of fibromyalgia with myofascial pain and multiple trigger points.Continue with Heat and exercise Regime.   Continue with current medication regimen with  gabapentin . 06/27/2024 2 Chronic migraine headaches..Continue current medication regimen with Almotriptan , she has a scheduled appointment wth Dr Skeet on 04/02/2024 Continue to Monitor. S/P Botox.on 05/30/2017. 08/25/2024 3. Midline Low Back Pain/ Lumbar Spondylosis/Lumbar degenerative disk disease, L4-5/ Lumbar Radiculitis: Continue with HEP and current medication regimen. Continue Hydrocodone  5/325 mg one tablet every 8 hours as needed #80. SABRAContinue with slow weaning of Hydrocodone .  08/25/2024 4. 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 Urology Following. 08/25/2024. 5. Right  CTS: Continue to wear Wrist stabilizer. 08/25/2024 6. Cervicalgia/ Cervical RadiculitisCervical Spondylosis:  Continue current medication regiment with  Gabapentin . Continue  with HEP and Continue to monitor. 08/25/2024 7. Muscle Spasm: Myofascial Pain: She is svcheduled for Trigger Point Injection with Dr Babs : Continue current medication regimen with Flexeril  as needed. 08/25/2024 8. Bilateral Greater Trochanter Bursitis:  Continue to alternate with ice and heat therapy. Continue HEP as Tolerated. Continue to monitor. 08/25/2024 9. Bilateral Knee Pain: S/P Knee Injection by Dr Harden on 02/23/2020. Orthopedics Following. Continue to Monitor.08/25/2024  10. Left Groin Pain: No complaints today.Ms. Dossett reports Oncology Following. Continue to Monitor. 08/25/2024.  11. Fall at Home: No Falls this month. Educated on falls prevention, she verbalizes understanding. Continue to Monitor. 08/25/2024  12. Everitt Hahn Tenosynovitis: Ortho Following. . We will Continue to Monitor. 10/26/2023

## 2024-09-02 ENCOUNTER — Telehealth: Payer: Self-pay

## 2024-09-02 NOTE — Telephone Encounter (Signed)
 Outcome Approved today by [facility] Medicare 2017 [employer] Your request has been approved Effective Date: 10/03/2023 Authorization Expiration Date: 09/02/2025 Drug Almotriptan  Malate 12.5MG  tablets  Outcome Approved today by [facility] Medicare 2017 [employer] Your request has been approved Effective Date: 10/03/2023 Authorization Expiration Date: 12/01/2024 Drug Cyclobenzaprine  HCl 10MG  tablets

## 2024-09-02 NOTE — Telephone Encounter (Signed)
(  KeyBETHA SPRAIN) PA Case ID #: [id number] Need Help? Call us  at [phone] Status sent iconSent to Plan today Drug Cyclobenzaprine  HCl 10MG  tablets ePA cloud logo Form [employer] Electronic PA Form [id number] NCPDP

## 2024-09-02 NOTE — Telephone Encounter (Signed)
(  Key: [id number]) PA Case ID #: [id number] Need Help? Call us  at [phone] Status sent iconSent to Plan today Drug Almotriptan  Malate 12.5MG  tablets ePA cloud logo Form [employer] Electronic PA Form [phone] NCPDP)

## 2024-09-04 ENCOUNTER — Ambulatory Visit: Admitting: Pharmacist

## 2024-09-11 ENCOUNTER — Other Ambulatory Visit: Payer: Self-pay | Admitting: Family Medicine

## 2024-09-29 ENCOUNTER — Other Ambulatory Visit: Payer: Self-pay | Admitting: Family Medicine

## 2024-09-29 DIAGNOSIS — K589 Irritable bowel syndrome without diarrhea: Secondary | ICD-10-CM

## 2024-10-14 ENCOUNTER — Ambulatory Visit: Admitting: Neurology

## 2024-10-16 ENCOUNTER — Encounter: Admitting: Registered Nurse

## 2024-10-20 ENCOUNTER — Telehealth: Payer: Self-pay | Admitting: Registered Nurse

## 2024-10-20 ENCOUNTER — Encounter: Admitting: Registered Nurse

## 2024-10-20 NOTE — Telephone Encounter (Signed)
Patient needs a refill on Norco

## 2024-10-28 ENCOUNTER — Encounter: Admitting: Registered Nurse

## 2024-10-28 NOTE — Progress Notes (Unsigned)
 "  Subjective:    Patient ID: Patty Salas, female    DOB: August 09, 1956, 69 y.o.   MRN: [id number]  HPI   Pain Inventory Average Pain {NUMBERS; 0-10:5044} Pain Right Now {NUMBERS; 0-10:5044} My pain is {PAIN DESCRIPTION:21022940}  In the last 24 hours, has pain interfered with the following? General activity {NUMBERS; 0-10:5044} Relation with others {NUMBERS; 0-10:5044} Enjoyment of life {NUMBERS; 0-10:5044} What TIME of day is your pain at its worst? {time of day:24191} Sleep (in general) {BHH GOOD/FAIR/POOR:22877}  Pain is worse with: {ACTIVITIES:21022942} Pain improves with: {PAIN IMPROVES TPUY:78977056} Relief from Meds: {NUMBERS; 0-10:5044}  Family History  Problem Relation Age of Onset   Cancer Mother        stomach   Migraines Mother    Arthritis Mother    Emphysema Mother    Heart disease Father    Hypertension Father    Cancer Father        colon   Dementia Father    Hypertension Brother    Migraines Brother    Hypertension Brother    Migraines Brother    Alcohol  abuse Brother    Bipolar disorder Brother    Migraines Daughter    Arthritis Maternal Grandmother    Cancer Maternal Grandmother        stomach   Diabetes Maternal Grandmother    Stroke Maternal Grandmother    Cancer Maternal Aunt        lung   Emphysema Maternal Aunt    Cancer Paternal Uncle        colon   Hypertension Maternal Aunt    Heart disease Maternal Aunt    Cancer Paternal Uncle        lung   Social History   Socioeconomic History   Marital status: Married    Spouse name: Not on file   Number of children: 1   Years of education: COLLEGE1   Highest education level: Some college, no degree  Occupational History   Occupation: HOUSEWIFE    Associate Professor: UNEMPLOYED  Tobacco Use   Smoking status: Never   Smokeless tobacco: Never  Vaping Use   Vaping status: Never Used  Substance and Sexual Activity   Alcohol  use: No   Drug use: No   Sexual activity: Not on file  Other  Topics Concern   Not on file  Social History Narrative   Patient is right handed.   Patient drinks caffeine occasionally.   Social Drivers of Health   Tobacco Use: Low Risk (08/25/2024)   Patient History    Smoking Tobacco Use: Never    Smokeless Tobacco Use: Never    Passive Exposure: Not on file  Financial Resource Strain: Patient Declined (07/30/2024)   Overall Financial Resource Strain (CARDIA)    Difficulty of Paying Living Expenses: Patient declined  Food Insecurity: No Food Insecurity (07/30/2024)   Epic    Worried About Programme Researcher, Broadcasting/film/video in the Last Year: Never true    Ran Out of Food in the Last Year: Never true  Transportation Needs: Unmet Transportation Needs (07/30/2024)   Epic    Lack of Transportation (Medical): Yes    Lack of Transportation (Non-Medical): No  Physical Activity: Inactive (07/30/2024)   Exercise Vital Sign    Days of Exercise per Week: 0 days    Minutes of Exercise per Session: Not on file  Stress: Stress Concern Present (07/30/2024)   [employer] of Occupational Health - Occupational Stress Questionnaire    Feeling of Stress: To  some extent  Social Connections: Moderately Integrated (07/30/2024)   Social Connection and Isolation Panel    Frequency of Communication with Friends and Family: Three times a week    Frequency of Social Gatherings with Friends and Family: Once a week    Attends Religious Services: 1 to 4 times per year    Active Member of Clubs or Organizations: No    Attends Banker Meetings: Not on file    Marital Status: Married  Depression (PHQ2-9): Medium Risk (07/30/2024)   Depression (PHQ2-9)    PHQ-2 Score: 5  Alcohol  Screen: Low Risk (07/31/2023)   Alcohol  Screen    Last Alcohol  Screening Score (AUDIT): 0  Housing: Low Risk (07/30/2024)   Epic    Unable to Pay for Housing in the Last Year: No    Number of Times Moved in the Last Year: 0    Homeless in the Last Year: No  Utilities: Not At Risk  (12/20/2023)   AHC Utilities    Threatened with loss of utilities: No  Health Literacy: Adequate Health Literacy (07/31/2023)   B1300 Health Literacy    Frequency of need for help with medical instructions: Never   Past Surgical History:  Procedure Laterality Date    kidney stones     ABDOMINAL ADHESION SURGERY     ABDOMINAL HYSTERECTOMY  1995   partial- hemmoraged after surgery-stitch came loose   APPENDECTOMY  1993   hemmoraged after surgery- stitch came loose   CERVICAL DISC SURGERY  06/04/2001   with fusion   CHOLECYSTECTOMY  1985   colonoscopy     COLONOSCOPY     CYSTOSCOPY     FOOT SURGERY     lt   INCISIONAL HERNIA REPAIR N/A 04/22/2021   Procedure: LAPAROSCOPIC INCISIONAL HERNIA REPAIR WITH MESH;  Surgeon: Signe Mitzie LABOR, MD;  Location: [facility];  Service: General;  Laterality: N/A;   jj stent  07/2002   with ureteroscopy, cystoscopy and then removal of stent in office   LAPAROSCOPIC LYSIS OF ADHESIONS N/A 04/22/2021   Procedure: LYSIS OF ADHESIONS;  Surgeon: Signe Mitzie LABOR, MD;  Location: [facility];  Service: General;  Laterality: N/A;   LITHOTRIPSY     LYMPH NODE BIOPSY Right 03/06/2014   Procedure: right groin LYMPH NODE BIOPSY;  Surgeon: Deward GORMAN Curvin DOUGLAS, MD;  Location: [facility];  Service: General;  Laterality: Right;   LYMPHADENECTOMY N/A 12/29/2015   Procedure: RETROPERITONEAL LYMPHADENECTOMY;  Surgeon: Ricardo Likens, MD;  Location: [facility];  Service: Urology;  Laterality: N/A;   NECK SURGERY  2002   ROBOT ASSISTED LAPAROSCOPIC NEPHRECTOMY Left 12/29/2015   Procedure: XI ROBOTIC ASSISTED LEFT LAPAROSCOPIC NEPHRECTOMY;  Surgeon: Ricardo Likens, MD;  Location: [facility];  Service: Urology;  Laterality: Left;   TONSILLECTOMY  1976   Past Surgical History:  Procedure Laterality Date    kidney stones     ABDOMINAL ADHESION SURGERY     ABDOMINAL HYSTERECTOMY  1995   partial- hemmoraged after surgery-stitch came loose   APPENDECTOMY  1993   hemmoraged after  surgery- stitch came loose   CERVICAL DISC SURGERY  06/04/2001   with fusion   CHOLECYSTECTOMY  1985   colonoscopy     COLONOSCOPY     CYSTOSCOPY     FOOT SURGERY     lt   INCISIONAL HERNIA REPAIR N/A 04/22/2021   Procedure: LAPAROSCOPIC INCISIONAL HERNIA REPAIR WITH MESH;  Surgeon: Signe Mitzie LABOR, MD;  Location: [facility];  Service: General;  Laterality: N/A;   jj stent  07/2002   with ureteroscopy, cystoscopy and then removal of stent in office   LAPAROSCOPIC LYSIS OF ADHESIONS N/A 04/22/2021   Procedure: LYSIS OF ADHESIONS;  Surgeon: Signe Mitzie LABOR, MD;  Location: [facility];  Service: General;  Laterality: N/A;   LITHOTRIPSY     LYMPH NODE BIOPSY Right 03/06/2014   Procedure: right groin LYMPH NODE BIOPSY;  Surgeon: Deward GORMAN Curvin DOUGLAS, MD;  Location: [facility];  Service: General;  Laterality: Right;   LYMPHADENECTOMY N/A 12/29/2015   Procedure: RETROPERITONEAL LYMPHADENECTOMY;  Surgeon: Ricardo Likens, MD;  Location: [facility];  Service: Urology;  Laterality: N/A;   NECK SURGERY  2002   ROBOT ASSISTED LAPAROSCOPIC NEPHRECTOMY Left 12/29/2015   Procedure: XI ROBOTIC ASSISTED LEFT LAPAROSCOPIC NEPHRECTOMY;  Surgeon: Ricardo Likens, MD;  Location: [facility];  Service: Urology;  Laterality: Left;   TONSILLECTOMY  1976   Past Medical History:  Diagnosis Date   Anxiety    Arthritis    Asthma    Bipolar 2 disorder (HCC)    pt stated, I have Bipolar 2 and it is remission   Bipolar affective (HCC)    CAD (coronary artery disease), native artery transplanted heart    Nonobstructive by coronary CTA 08/2021 with less than 25% stenosis in the LAD, RCA and left circumflex.   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   Full dentures    Gout     Headache(784.0)    migraines   Herpesviral infection    Hypertension    IBS (irritable bowel syndrome)    with diarrhea   Lymphadenopathy    Myalgia and myositis, unspecified    Nephrolithiasis 02/11/2013   PAT (paroxysmal atrial tachycardia)    asymptomatic 10 beat run on event monitor 09/2020   PFO (patent foramen ovale)    Small PFO noted on coronary CTA   Pneumonia    PONV (postoperative nausea and vomiting)    PTSD (post-traumatic stress disorder)    Renal calculi    Restless legs syndrome (RLS)    Thoracic radiculopathy    Vitamin D  deficiency    Wears glasses    There were no vitals taken for this visit.  Opioid Risk Score:   Fall Risk Score:  `1  Depression screen PHQ 2/9     07/30/2024    2:22 PM 06/27/2024    9:50 AM 04/18/2024   12:22 PM 02/19/2024    9:57 AM 01/16/2024   11:12 AM 10/11/2023   11:49 AM 08/27/2023    1:54 PM  Depression screen PHQ 2/9  Decreased Interest 0 1 0 0 0 1 0  Down, Depressed, Hopeless 0 1 0 0 0 1 0  PHQ - 2 Score 0 2 0 0 0 2 0  Altered sleeping 0  0 1 0    Tired, decreased energy 2  0 0 0    Change in appetite 1  0 0 0    Feeling bad or failure about yourself  0  0 0 0  0  Trouble concentrating 1  0 0 0    Moving slowly or fidgety/restless 1  0 0 1    Suicidal  thoughts 0  0 0 0    PHQ-9 Score 5   0  1  1     Difficult doing work/chores   Somewhat difficult Somewhat difficult Not difficult at all       Data saved with a previous flowsheet row definition    Review of Systems     Objective:   Physical Exam        Assessment & Plan:    "

## 2024-10-30 ENCOUNTER — Other Ambulatory Visit: Payer: Self-pay | Admitting: Family Medicine

## 2024-10-30 ENCOUNTER — Encounter: Admitting: Registered Nurse

## 2024-11-06 ENCOUNTER — Encounter: Admitting: Registered Nurse

## 2024-11-19 ENCOUNTER — Encounter: Admitting: Registered Nurse

## 2024-12-31 ENCOUNTER — Encounter: Admitting: Physical Medicine & Rehabilitation

## 2025-01-22 ENCOUNTER — Ambulatory Visit: Admitting: Neurology

## 2025-01-28 ENCOUNTER — Ambulatory Visit: Admitting: Family Medicine
